# Patient Record
Sex: Female | Born: 1945 | Race: Black or African American | Hispanic: No | Marital: Married
Health system: Southern US, Community
[De-identification: ages and names within clinical notes are randomized; demographics above are authoritative.]

---

## 2005-11-18 ENCOUNTER — Ambulatory Visit: Payer: Self-pay | Admitting: Endocrinology

## 2005-11-18 IMAGING — CT CT ANGIO AORTA RUNOFF
2 of 5 series · 9 of 16 positions shown, 11 images · IV contrast (APPLIED)
Comparison: none

REASON FOR EXAM: Claudication
COMMENTS:

[Series 5: inspace upper · axial · 0.82mm/px · z∈[+751,+1387]mm · 6 of 743 slices shown, 8 images]
[im 107/743  soft-tissue]
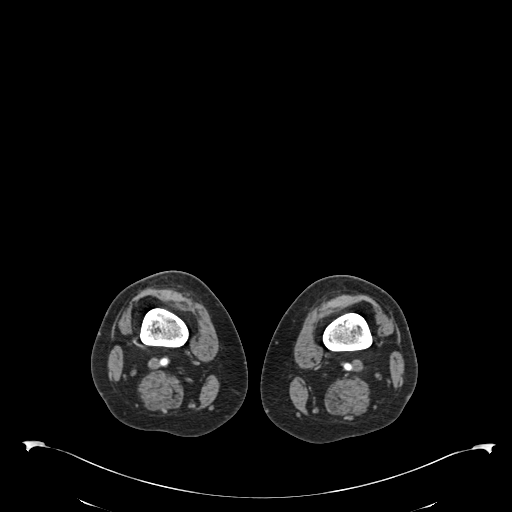
[im 107/743  bone]
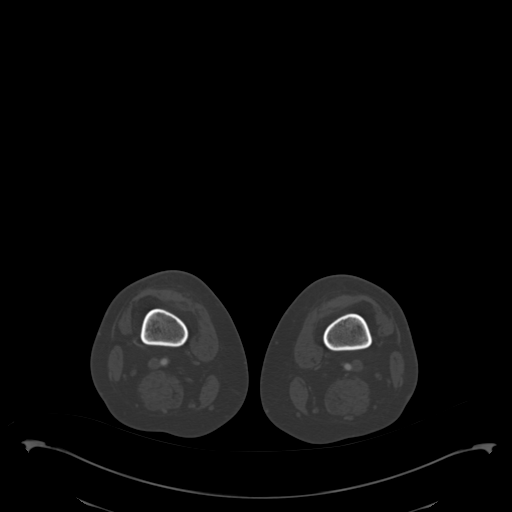
[im 213/743  soft-tissue]
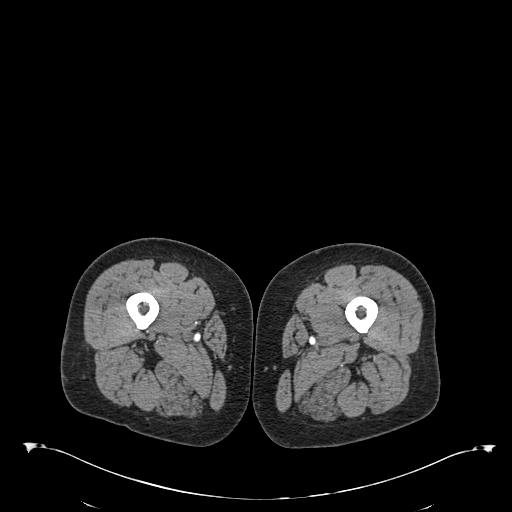
[im 319/743  soft-tissue]
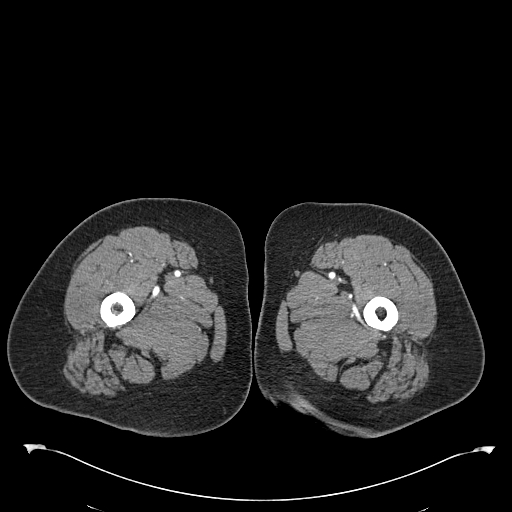
[im 425/743  soft-tissue]
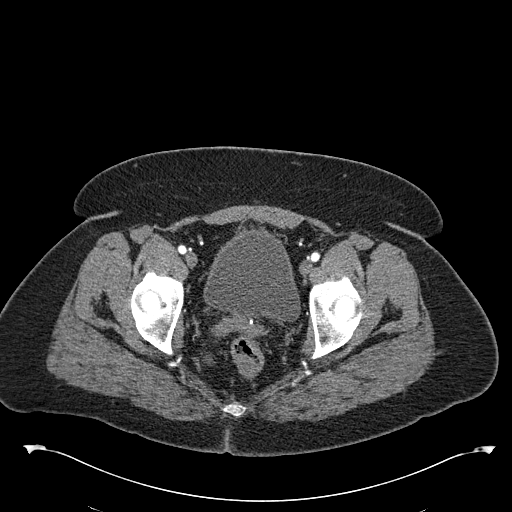
[im 531/743  soft-tissue]
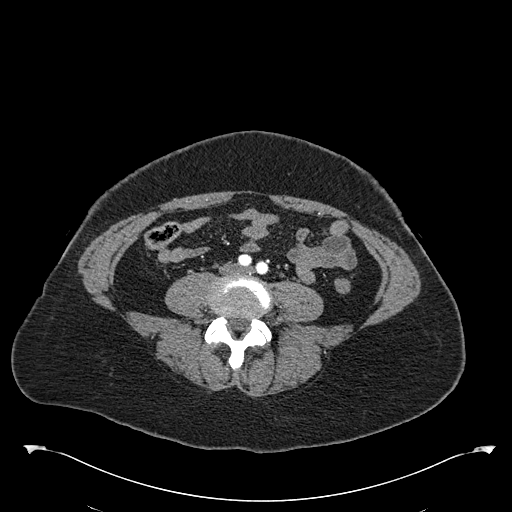
[im 531/743  bone]
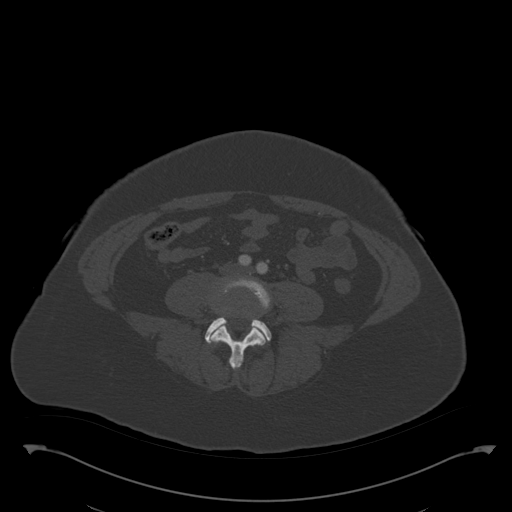
[im 637/743  soft-tissue]
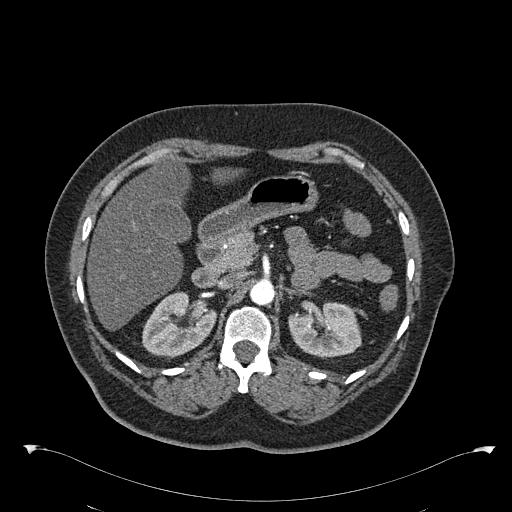

[Series 7: inspace lower · axial · 0.82mm/px · z∈[+315,+632]mm · 3 of 528 slices shown]
[im 132/528  soft-tissue]
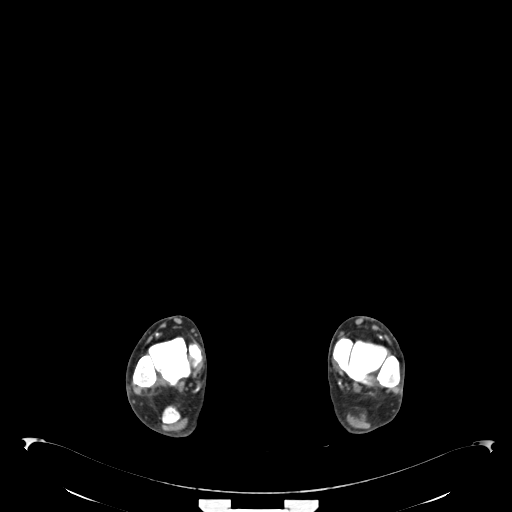
[im 264/528  soft-tissue]
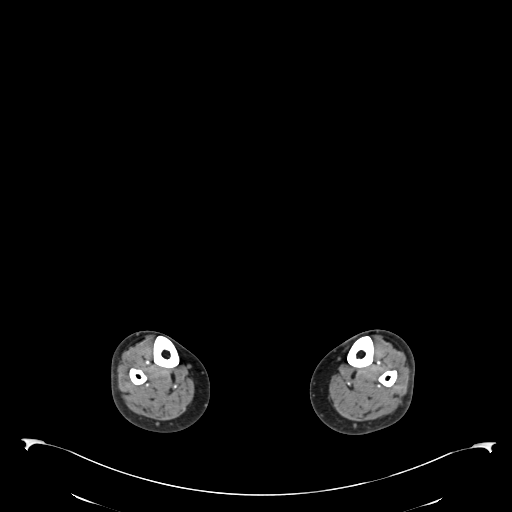
[im 396/528  soft-tissue]
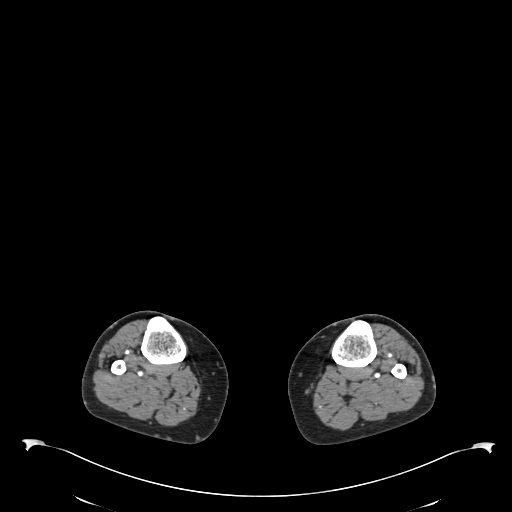

[9 of 16 positions shown; findings below may reference images not displayed]

PROCEDURE:     CT  - CT ANGIOGRAPHY AORTA RUNOFF  - [DATE]  [DATE]

RESULT:          Axial source images, maximum intensity projections, and 3D
imaging were  obtained of the abdominal aorta and lower extremities status
post intravenous administration of 125 ml of [A7].

The 3D reconstructions and maximum intensity projections were performed by
the interpreting physician.

Evaluation of the abdominal aorta and common iliac arteries demonstrates no
evidence of strictural narrowing, segmental nor focal stenosis, nor fusiform
dilatation or focal outpouchings.

Evaluation of the LEFT lower extremity and iliac vessels demonstrates areas
of moderate-to-severe atherosclerotic disease within the perforating vessels
of the internal iliac artery.  The external iliac artery demonstrates no
evidence of significant atherosclerotic disease.  Evaluation of the common
femoral artery demonstrates no evidence of significant atherosclerotic
disease.  The profundal femoral artery demonstrates regions of
moderate-to-severe atherosclerotic disease within the deep perforating
vessels.  The superficial femoral artery on the LEFT demonstrates no
evidence of significant atherosclerotic disease, fusiform dilatation, focal
outpouchings or strictural narrowing.  Evaluation of the trifurcation
vessels demonstrates patency of the proximal third of the anterior tibial
artery with complete narrowing at the distal third.  The opacified portion
of the anterior tibial artery demonstrates what appears to be
mild-to-moderate atherosclerotic disease, though there is occlusion of the
distal third without evidence of reconstitution.  The tibioperoneal trunk
demonstrates complete occlusion of the distal third of the peroneal artery
with diffuse narrowing of the peroneal artery which appears to be consistent
with diffuse, likely moderate atherosclerotic disease.  The posterior tibial
artery demonstrates patency to and through the level of the ankle with what
appears to be diffuse narrowing likely representing the sequela of moderate
atherosclerotic disease.

Evaluation of the RIGHT common iliac demonstrates no evidence of
abnormalities.  The internal iliac artery demonstrates what appears to be
moderate-to-severe atherosclerotic disease within the perforating vessels.
The common femoral artery appears intact without abnormalities.  The
profundal femoral artery demonstrates areas of severe atherosclerotic
disease within the perforating vessels.  The superficial femoral artery
appears to be patent throughout its course without evidence of significant
disease.  Note, the RIGHT and LEFT popliteal arteries appear to be intact
without evidence of significant disease.  The anterior tibial artery on the
RIGHT is intact to its distal third where there is complete occlusion
indicative of severe atherosclerotic disease.  Skip lesions of
reconstitution are demonstrated to and through the level of the ankle with
large areas of complete occlusion.  The peroneal artery demonstrates diffuse
moderate narrowing indicative of diffuse moderate atherosclerotic disease
with complete occlusion along the distal third.  The posterior tibial artery
appears to be patent to and through the level of the ankle with diffuse,
moderate narrowing indicative of diffuse atherosclerotic disease.
IMPRESSION: Findings consistent with severe atherosclerotic disease
within the distal third of the anterior tibial and peroneal arteries on the
RIGHT and LEFT with complete occlusion.  There appears to be moderate
atherosclerotic disease throughout the RIGHT and LEFT posterior tibial
arteries which appear to be patent to and through the level of the ankles as
well as within the course of the peroneal arteries.

## 2008-05-14 ENCOUNTER — Inpatient Hospital Stay (HOSPITAL_COMMUNITY): Admission: EM | Admit: 2008-05-14 | Discharge: 2008-05-16 | Payer: Self-pay | Admitting: Emergency Medicine

## 2008-05-14 ENCOUNTER — Ambulatory Visit: Payer: Self-pay | Admitting: Internal Medicine

## 2008-05-14 IMAGING — CT CT HEAD W/O CM
5 series · 18 of 47 positions shown, 19 images · non-contrast
Comparison: None

CT HEAD

CLINICAL DATA: Found unresponsive by daughter.  EtOH.

CT HEAD WITHOUT CONTRAST
CT CERVICAL SPINE WITHOUT CONTRAST
TECHNIQUE: Multidetector CT imaging of the head and cervical spine
was performed following the standard protocol without intravenous
contrast.  Multiplanar CT image reconstructions of the cervical
spine were also generated.

[Series 3: headseq 4.8 h45s · axial · 0.43mm/px · z∈[-128,-80]mm · 2 of 30 slices shown, 3 images]
[im 10/30  brain]
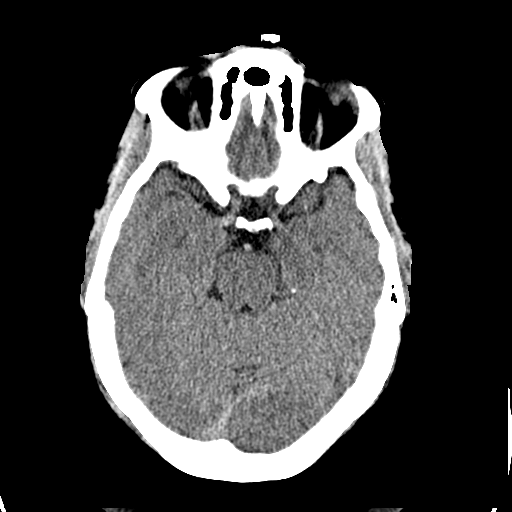
[im 10/30  bone]
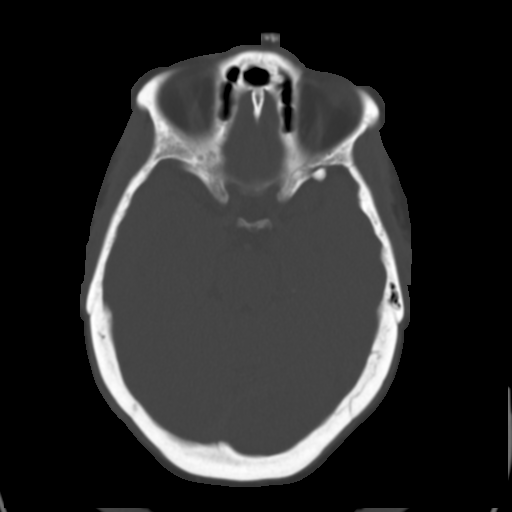
[im 20/30  brain]
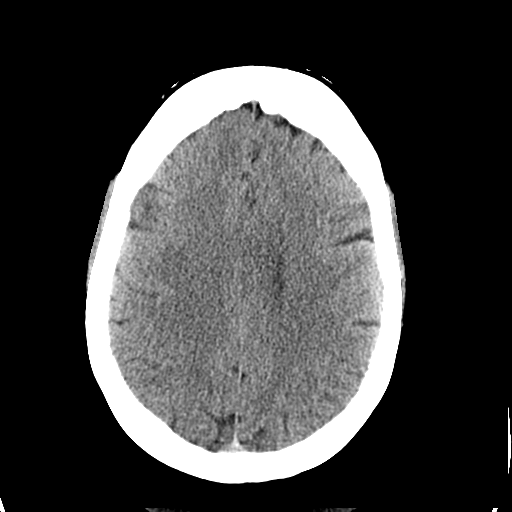

[Series 4: c_spine 2.0 b31s · axial · 0.22mm/px · z∈[-312,-292]mm · 2 of 77 slices shown]
[im 10/77  brain]
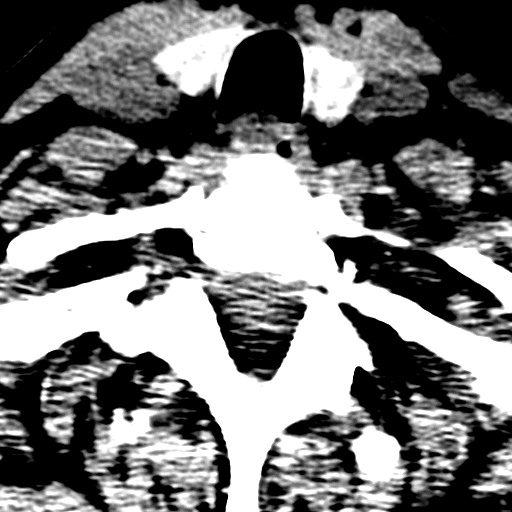
[im 20/77  brain]
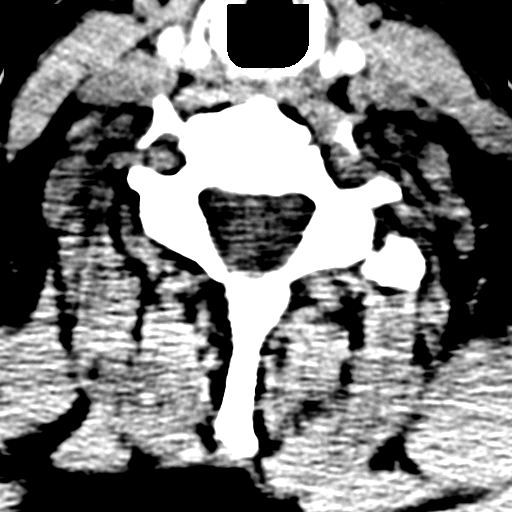

[Series 602: axial cervical · axial · 0.30mm/px · z∈[-350,-234]mm · 8 of 149 slices shown]
[im 10/149  brain]
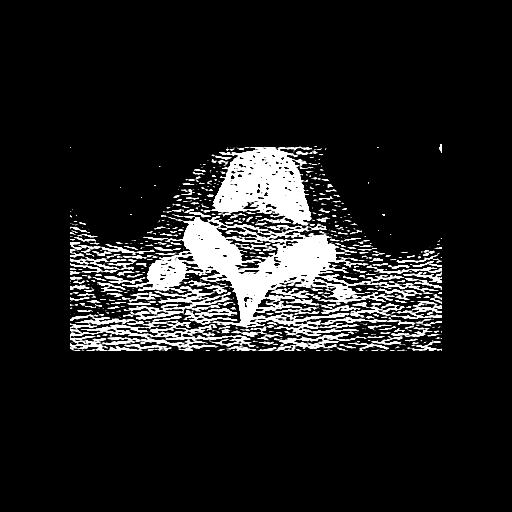
[im 28/149  brain]
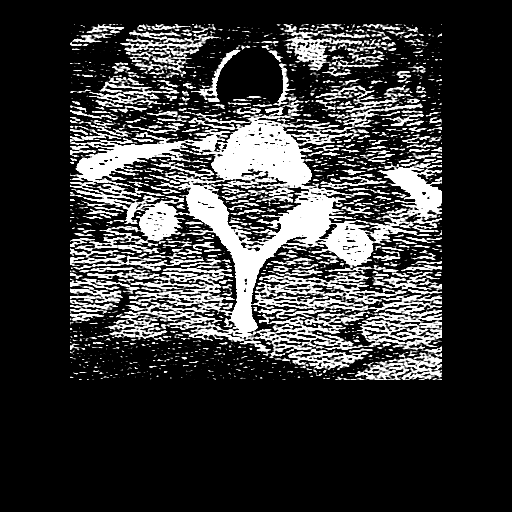
[im 47/149  brain]
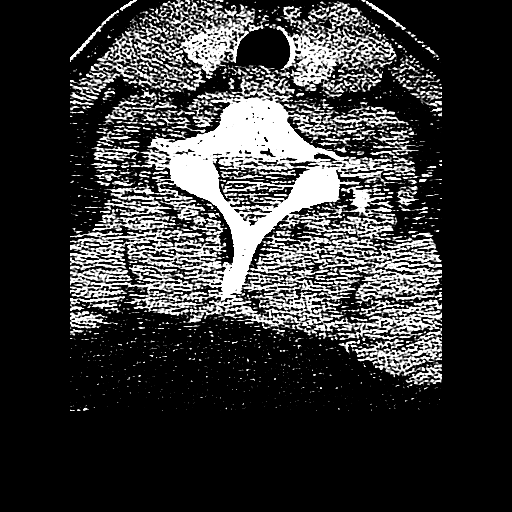
[im 65/149  brain]
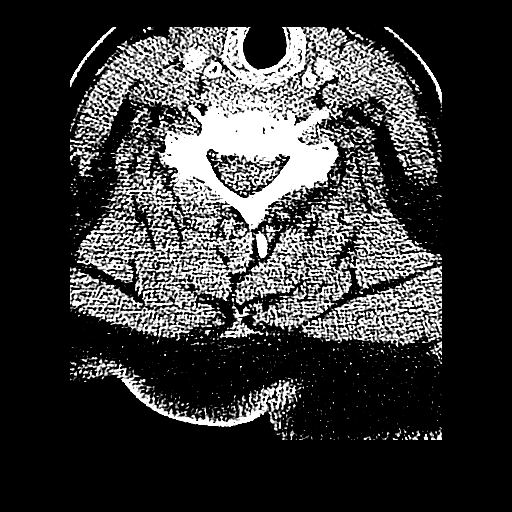
[im 84/149  brain]
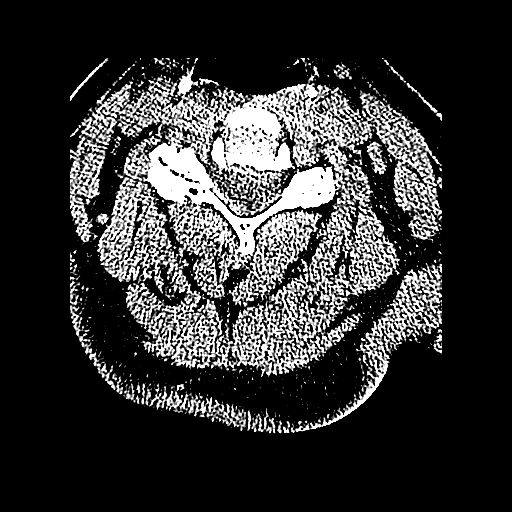
[im 102/149  brain]
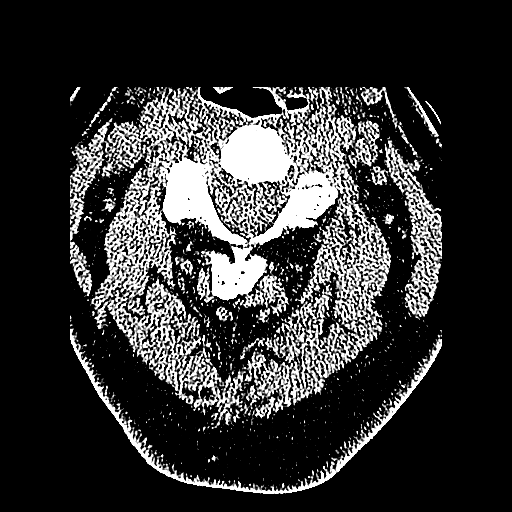
[im 121/149  brain]
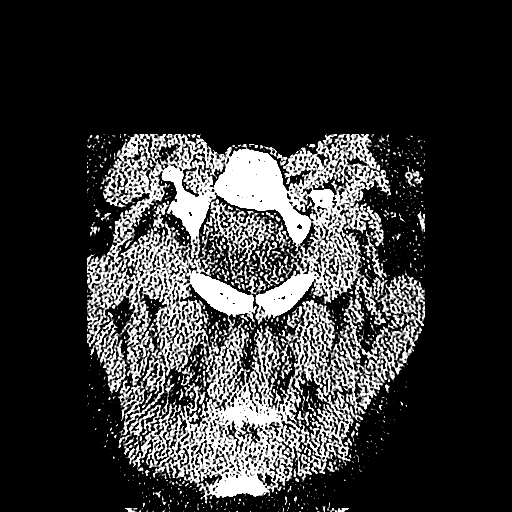
[im 139/149  brain]
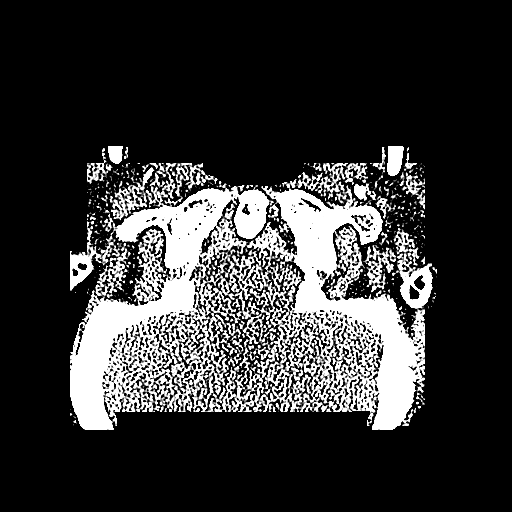

[Series 603: coronal cervical · coronal · 0.30mm/px · 3 of 70 slices shown]
[im 24/70  brain]
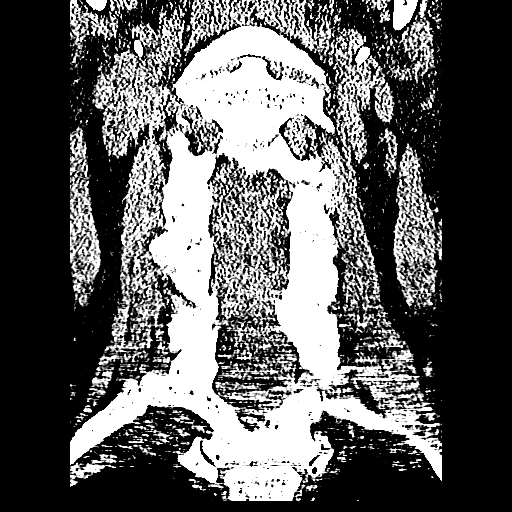
[im 31/70  brain]
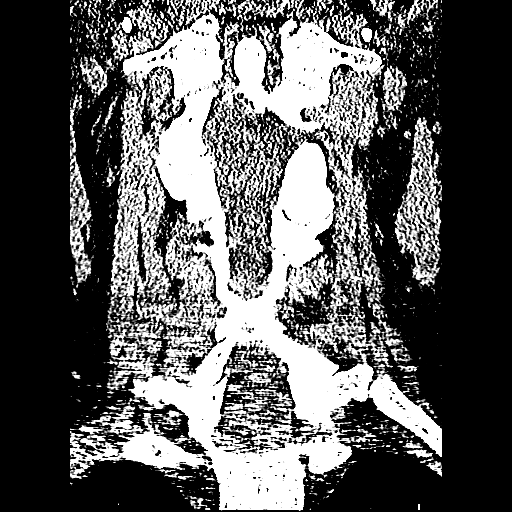
[im 39/70  brain]
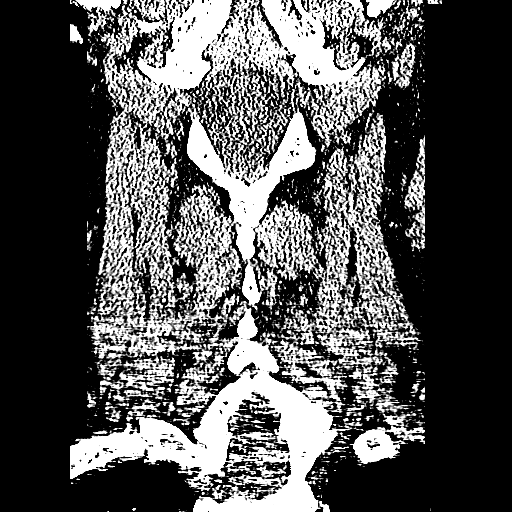

[Series 604: sagittal cervical · sagittal · 0.30mm/px · 3 of 70 slices shown]
[im 24/70  brain]
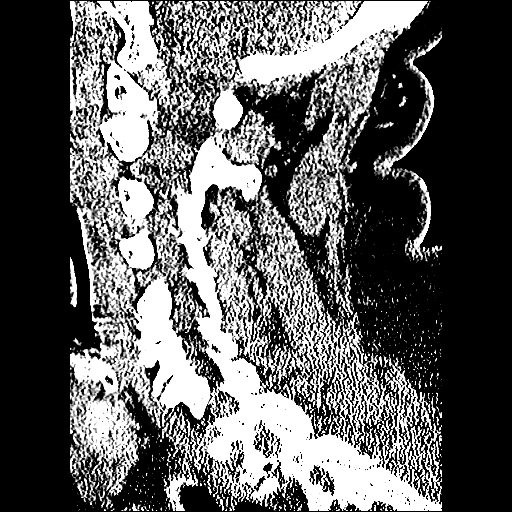
[im 35/70  brain]
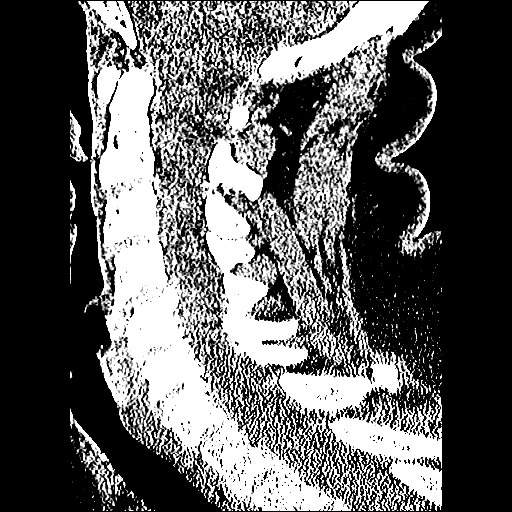
[im 47/70  brain]
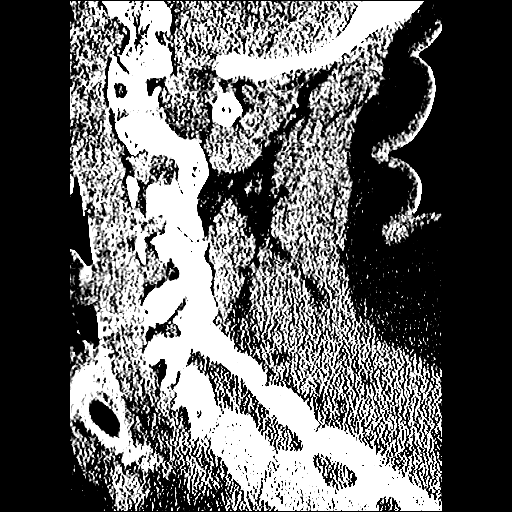

[18 of 47 positions shown; findings below may reference images not displayed]

FINDINGS: There is no intra or extra-axial fluid collection or
mass.  The basilar cisterns and ventricles have normal appearance.
There is no CT evidence for acute infarction or intracranial
hemorrhage.  Bone windows are unremarkable.
IMPRESSION: No CT evidence for acute intracranial abnormality.

CT CERVICAL SPINE
FINDINGS: There is degenerative change within the cervical spine,
most notable at C3-4, C4-5, C5-6, C6-7.  There is no evidence for
acute fracture or dislocation.  Note is made of unfused posterior
ring of C1.  Visualized soft tissues of the neck have normal
appearance.
IMPRESSION: Degenerative change.  No evidence for acute abnormality.

## 2008-12-25 ENCOUNTER — Ambulatory Visit (HOSPITAL_COMMUNITY): Admission: RE | Admit: 2008-12-25 | Discharge: 2008-12-25 | Payer: Self-pay | Admitting: Family Medicine

## 2009-03-16 ENCOUNTER — Encounter: Admission: RE | Admit: 2009-03-16 | Discharge: 2009-03-16 | Payer: Self-pay | Admitting: Family Medicine

## 2009-03-16 IMAGING — CR DG CHEST 2V
2 series · 2 of 2 positions shown · non-contrast
Comparison: [DATE]

CLINICAL DATA: Cough.  Hypertension.

CHEST - 2 VIEW

[w chest pa]
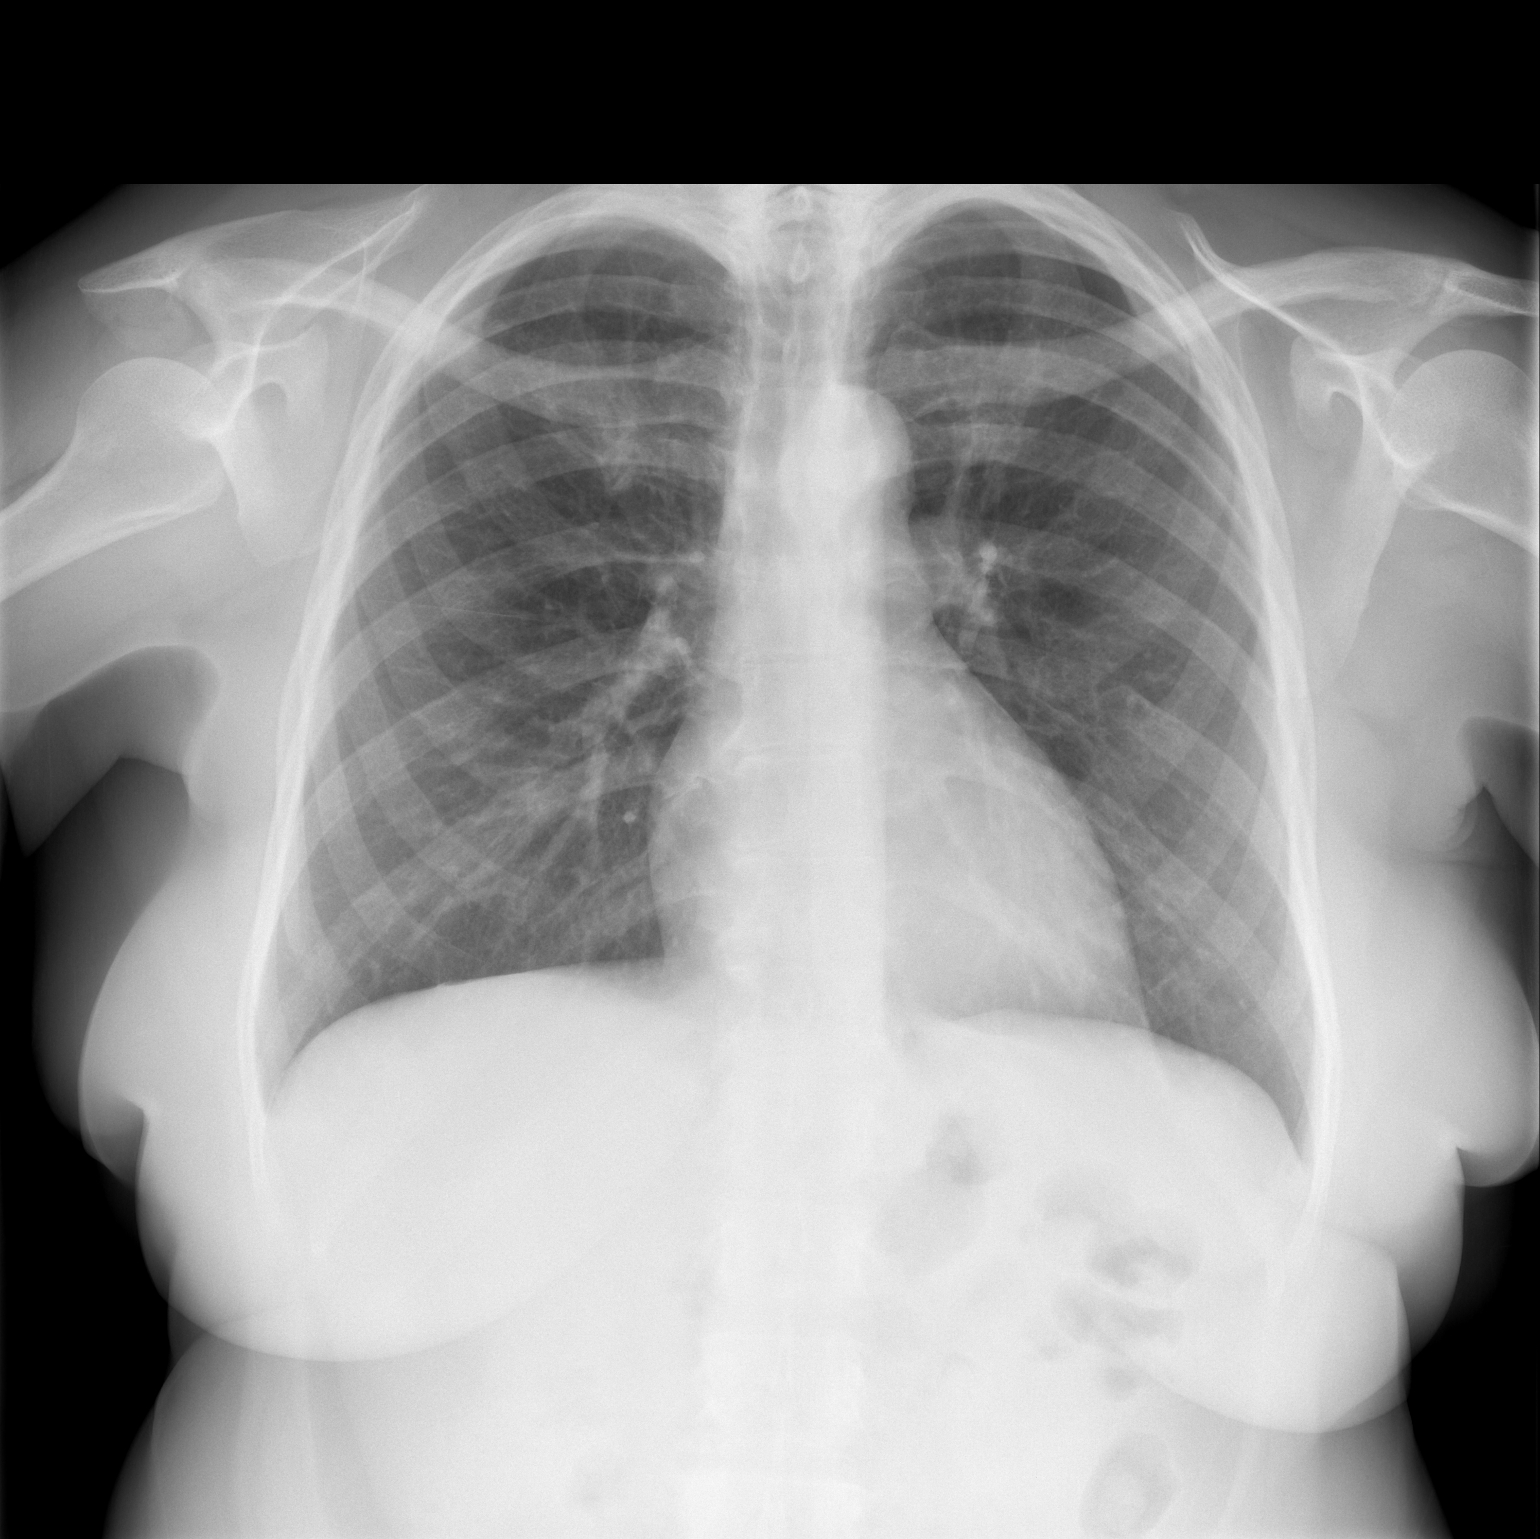

[w chest lat]
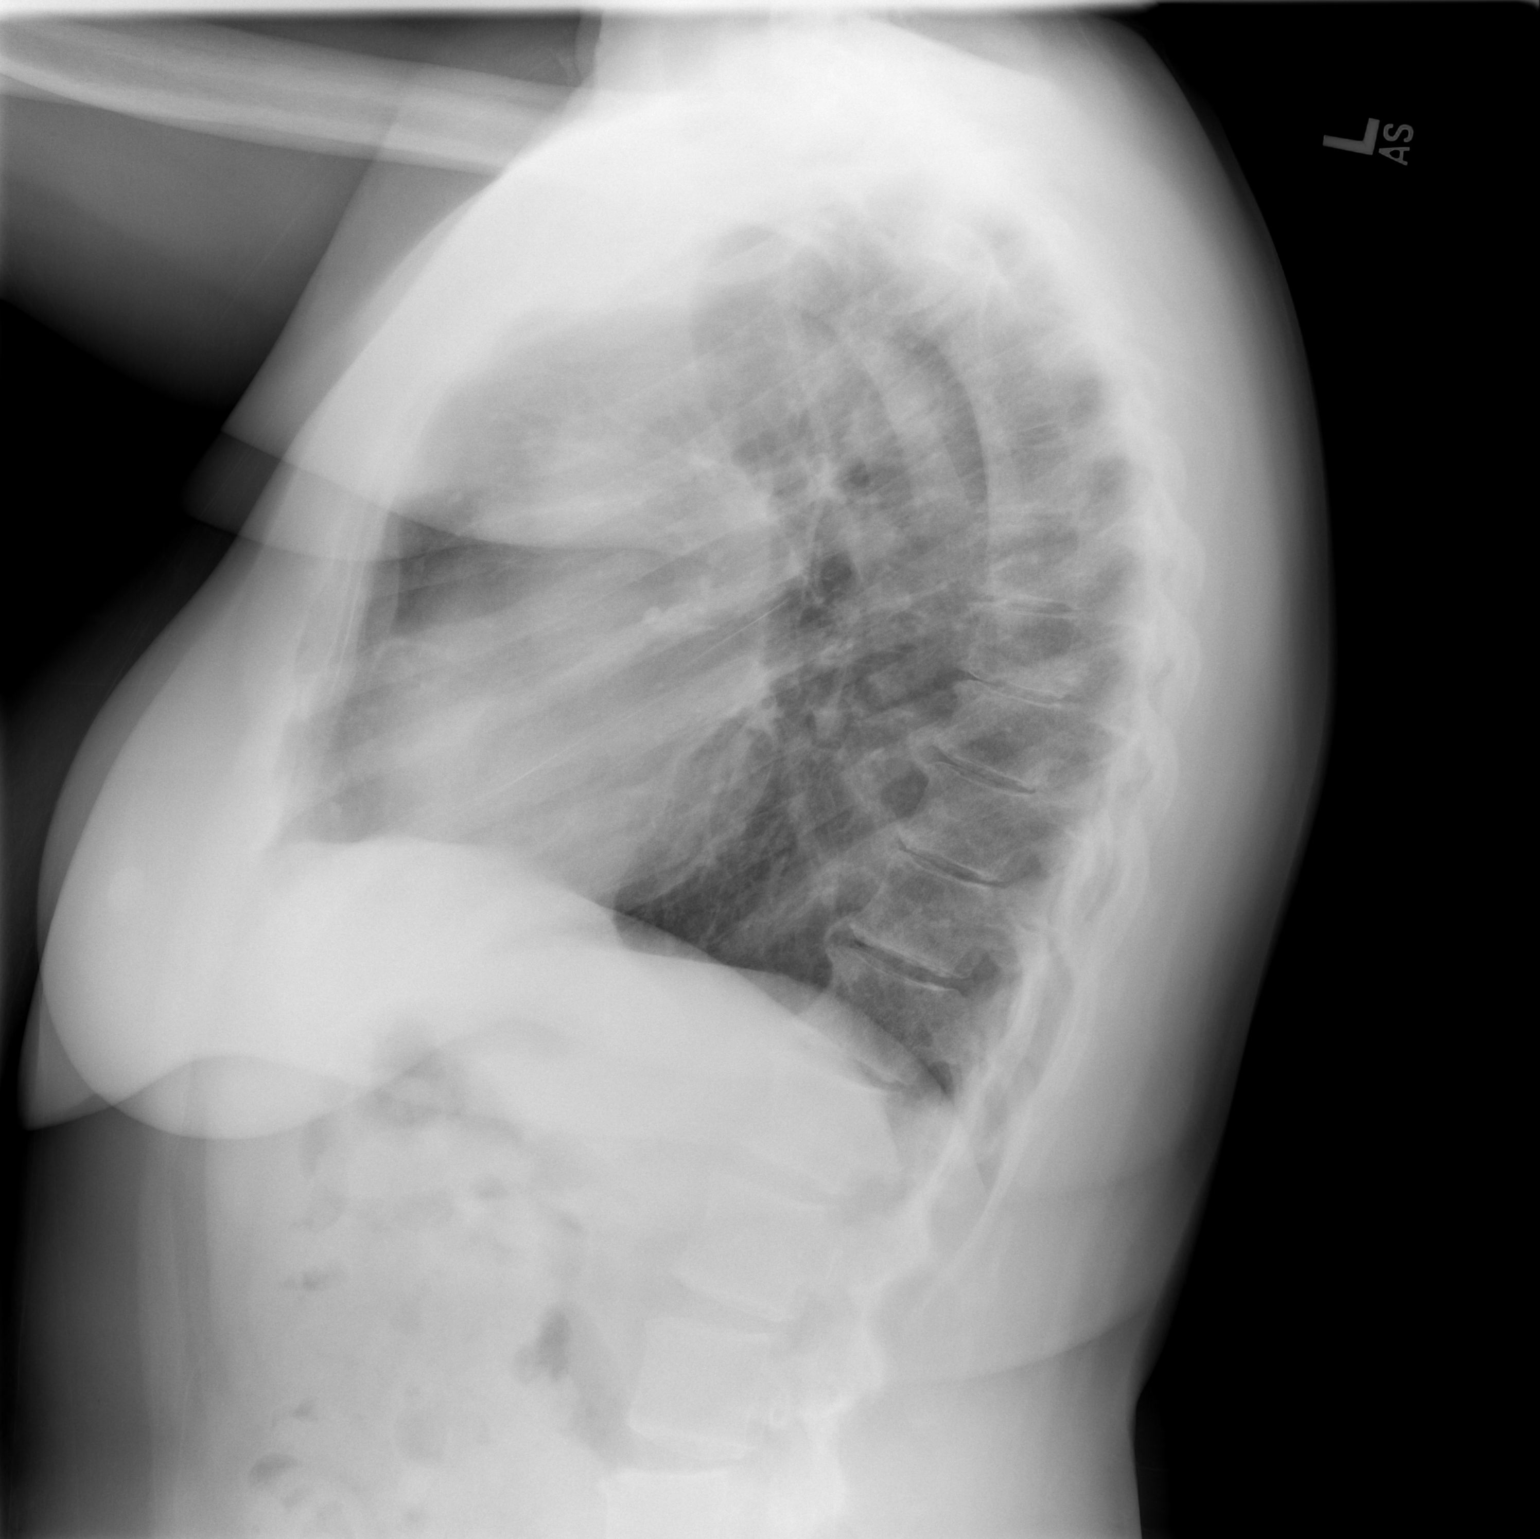

[2 of 2 positions shown; findings below may reference images not displayed]

FINDINGS: Heart size is normal.  The mediastinum is unremarkable.
Lungs are clear.  No effusions.  No significant bony finding.
IMPRESSION: No active disease

## 2010-03-11 ENCOUNTER — Other Ambulatory Visit: Admission: RE | Admit: 2010-03-11 | Discharge: 2010-03-11 | Payer: Self-pay | Admitting: Family Medicine

## 2010-03-18 ENCOUNTER — Encounter: Admission: RE | Admit: 2010-03-18 | Discharge: 2010-03-18 | Payer: Self-pay | Admitting: Family Medicine

## 2010-10-14 ENCOUNTER — Encounter: Admission: RE | Admit: 2010-10-14 | Discharge: 2010-10-14 | Payer: Self-pay | Admitting: Family Medicine

## 2010-10-14 IMAGING — CR DG HIP COMPLETE 2+V*R*
2 series · 2 of 2 positions shown · non-contrast
Comparison: None

CLINICAL DATA: Pain posterior right hip down leg

RIGHT HIP - COMPLETE 2+ VIEW

[t hip ap right]
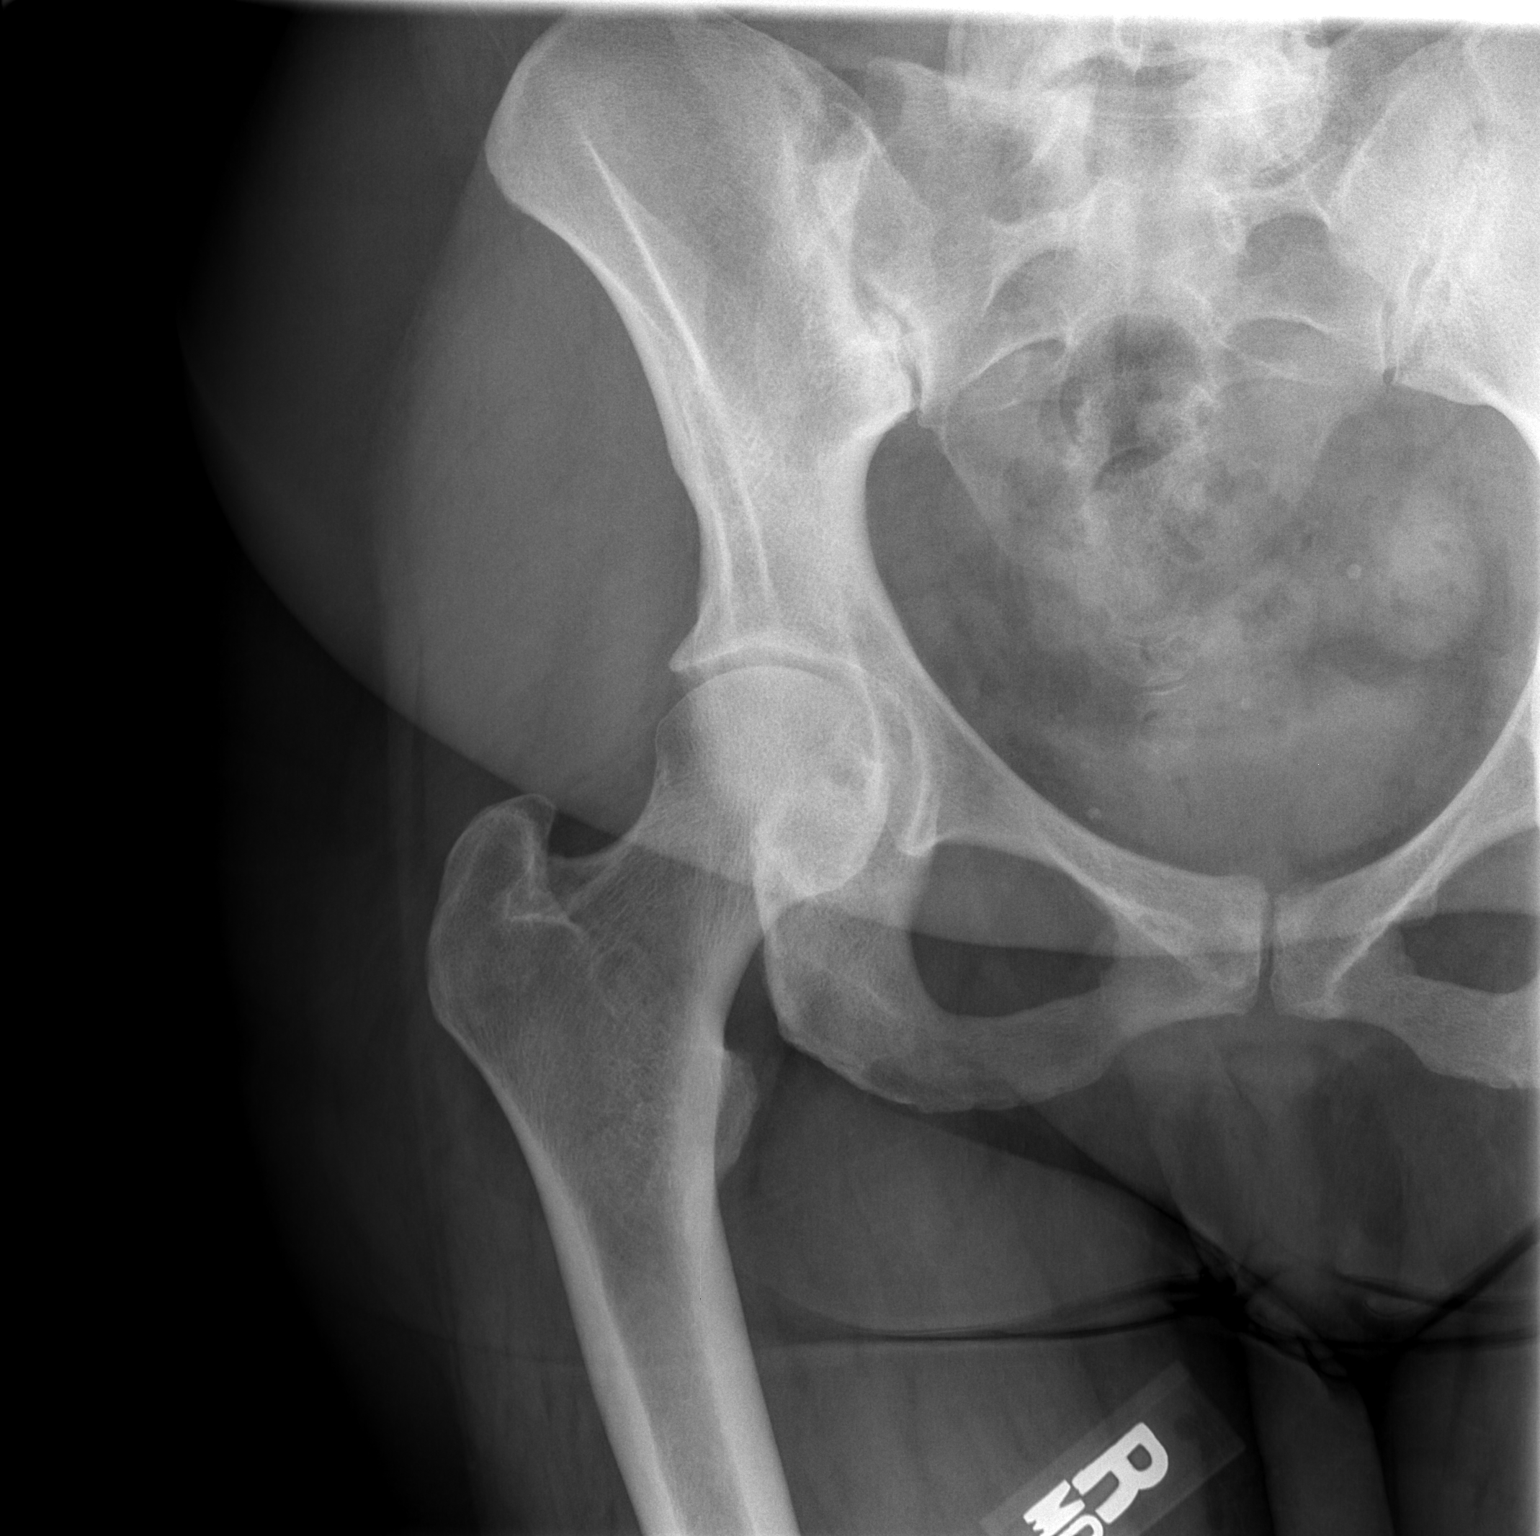

[t hip frog leg right]
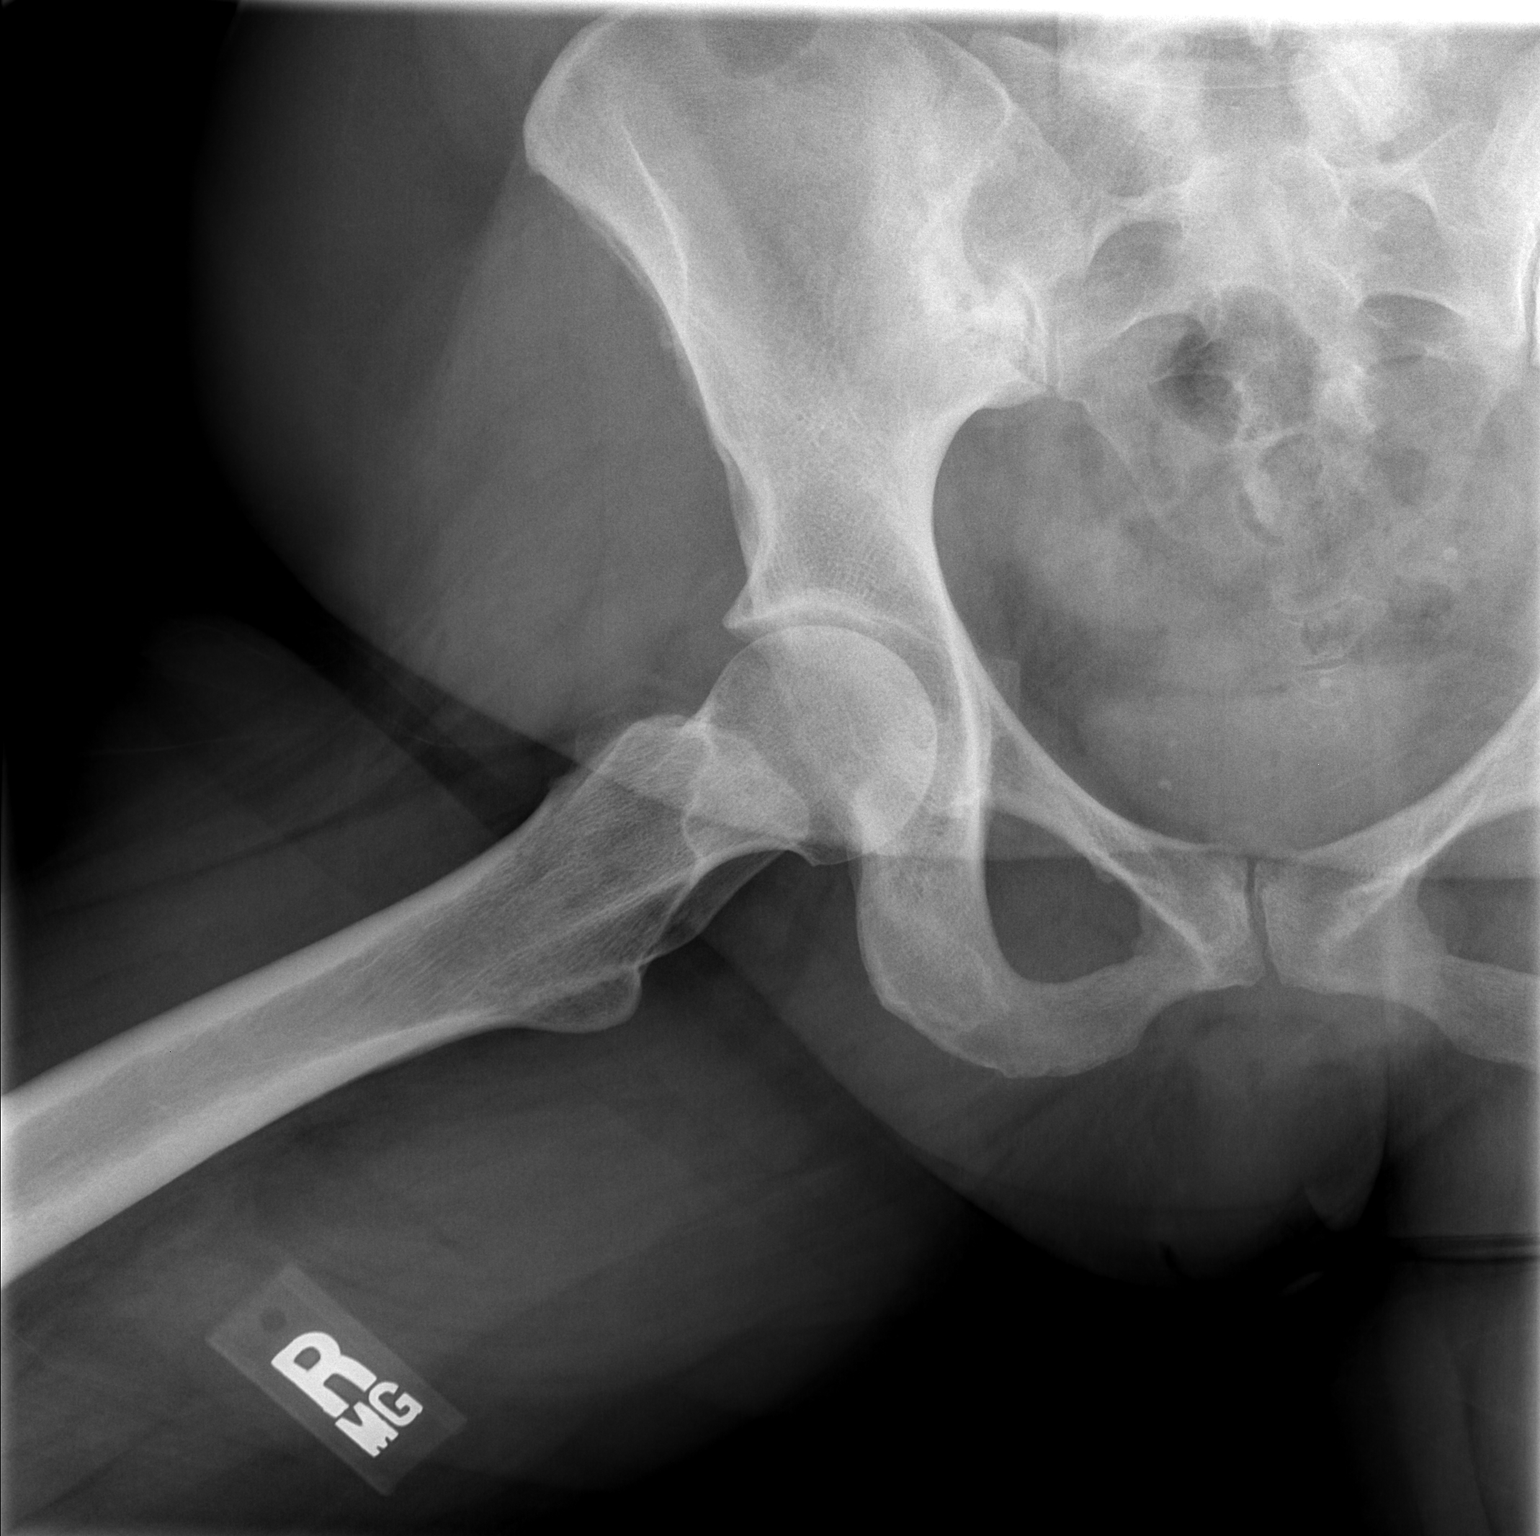

[2 of 2 positions shown; findings below may reference images not displayed]

FINDINGS: Right hip joint space fairly well preserved.
No acute fracture or dislocation.
Tiny pelvic phleboliths.
Osseous mineralization grossly unremarkable.
Asymmetric sclerosis along iliac side of right SI joint suggesting
sacroiliitis.
IMPRESSION: No acute right hip joint abnormalities.
Right sacroiliitis.

## 2010-10-14 IMAGING — CR DG LUMBAR SPINE 2-3V
3 series · 3 of 3 positions shown · non-contrast
Comparison: None

CLINICAL DATA: Posterior right hip down the leg

LUMBAR SPINE - 2-3 VIEW

[t l-spine a.p.]
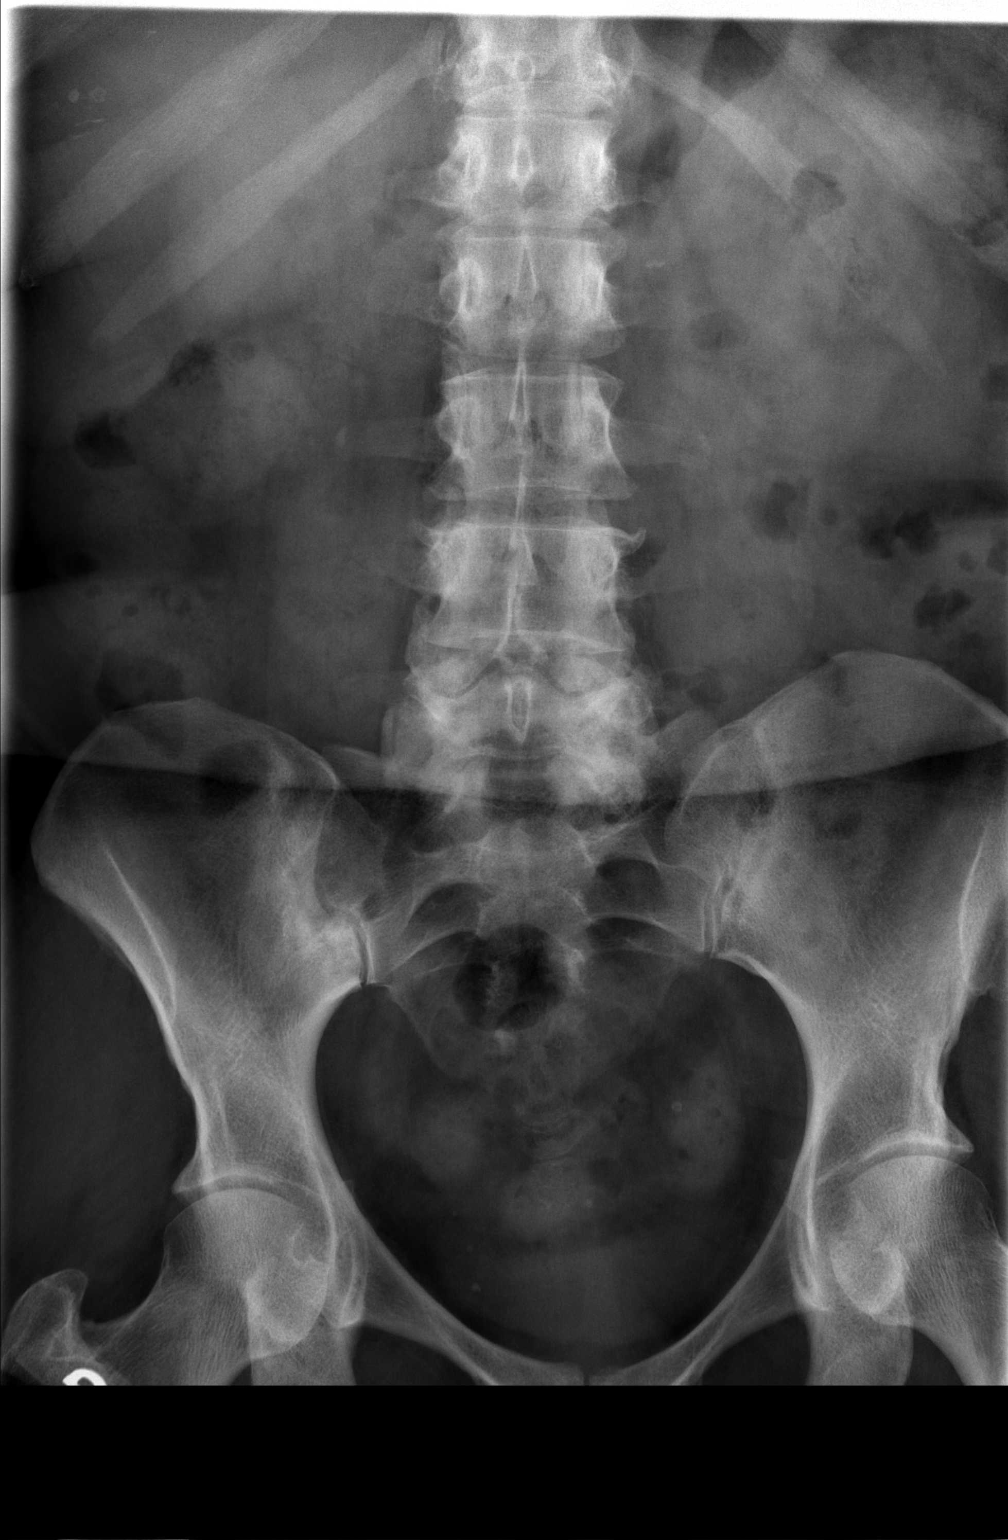

[t l-spine lat]
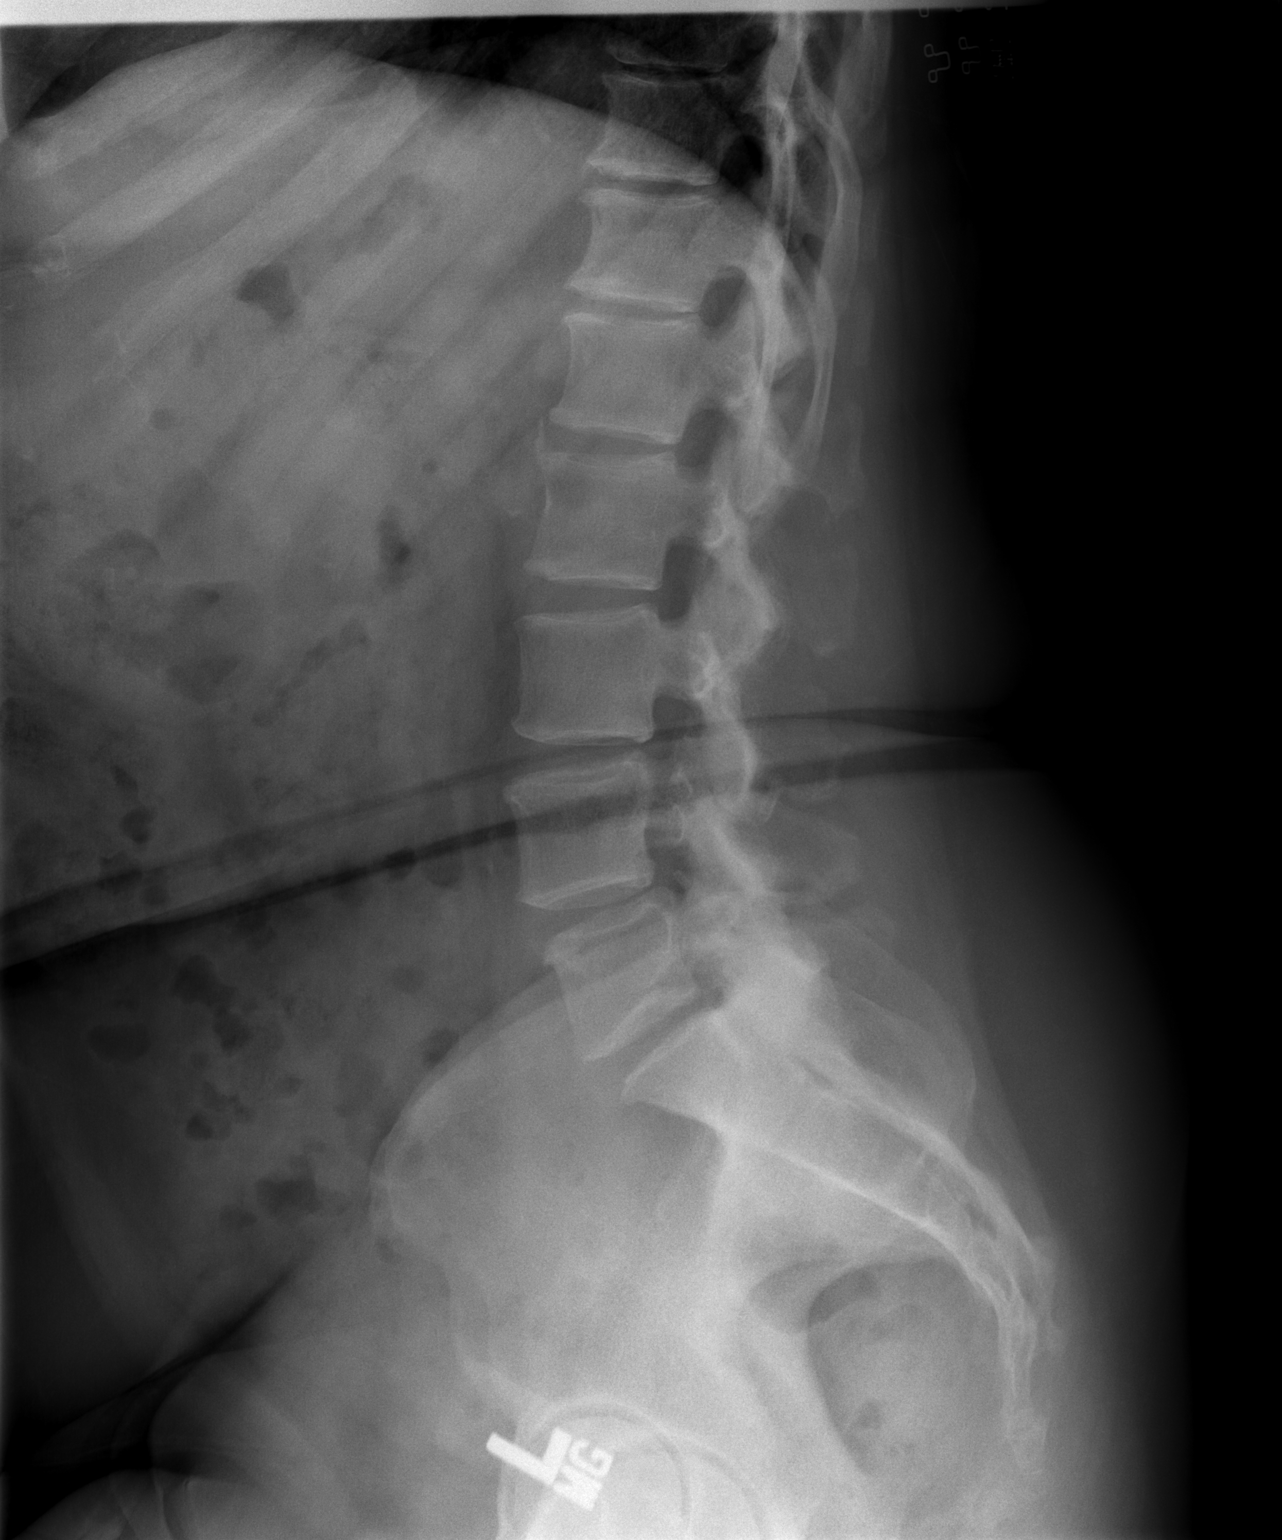

[t l-spine l5-s1 spot]
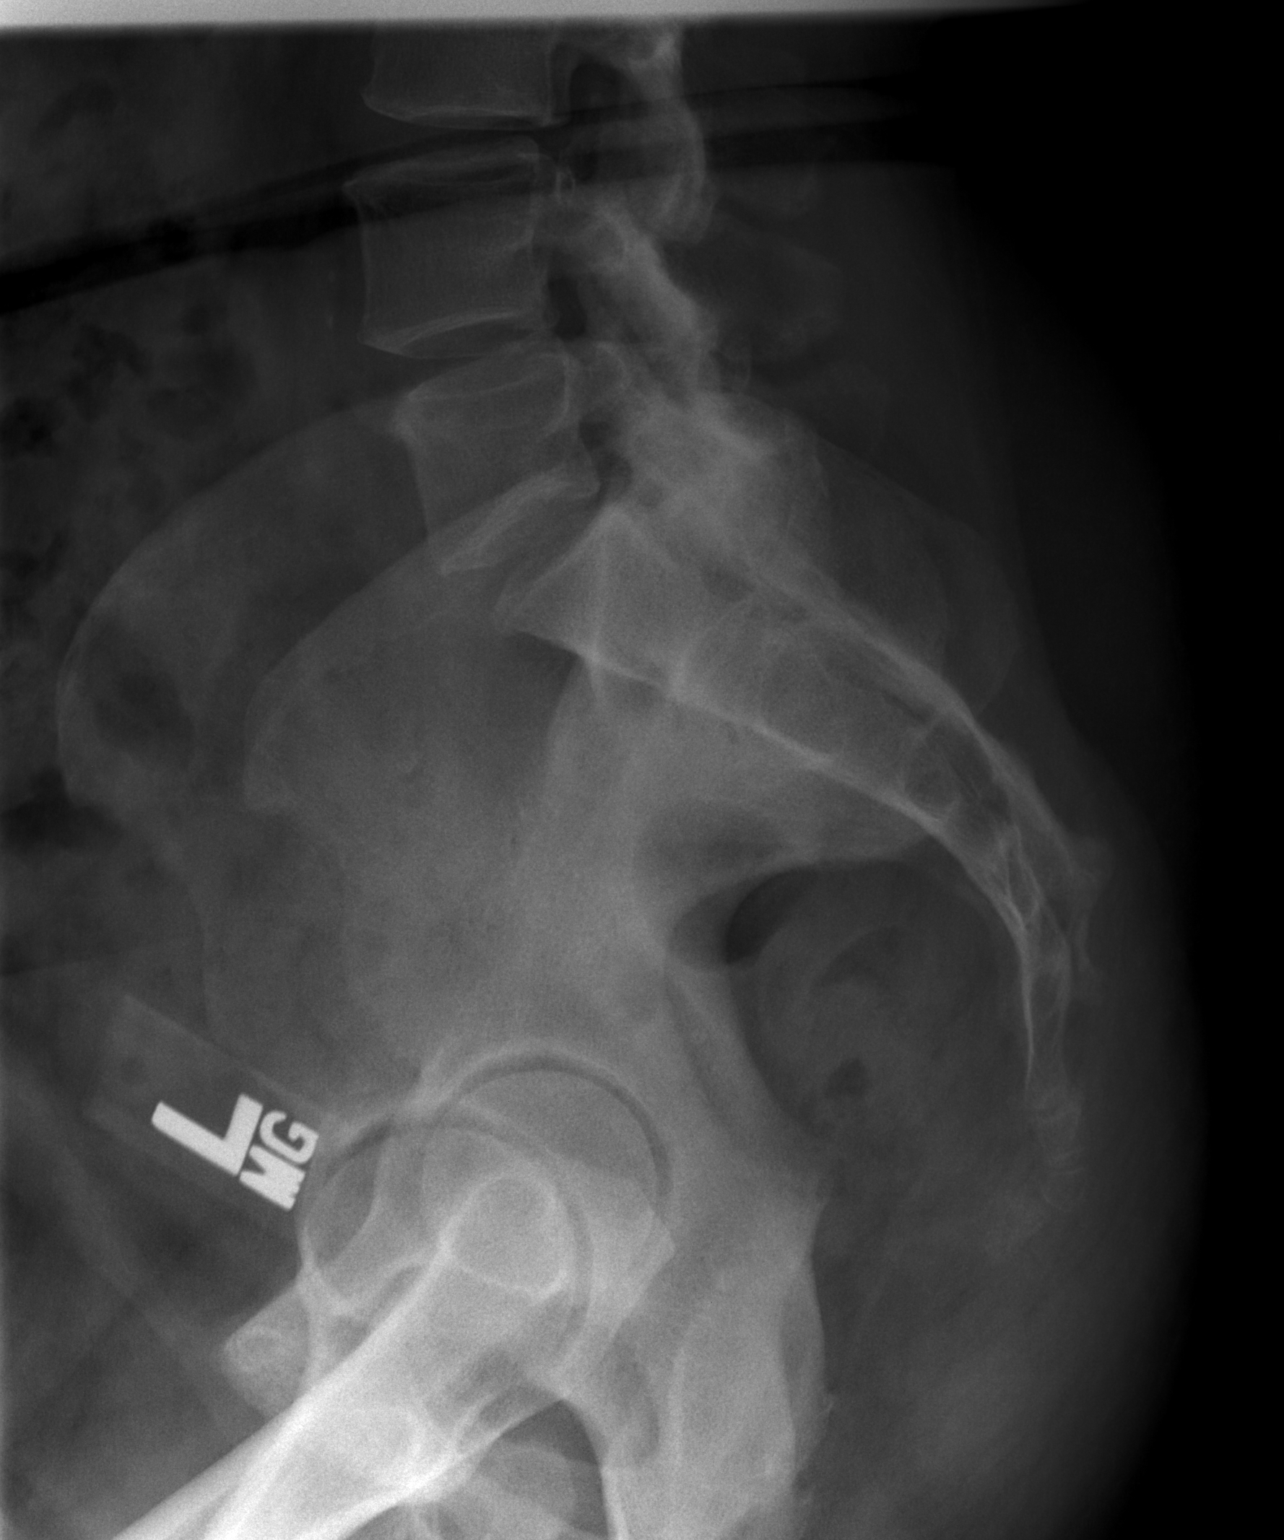

[3 of 3 positions shown; findings below may reference images not displayed]

FINDINGS: Five non-rib bearing lumbar vertebrae.
Disc space narrowing L3-L4, L4-L5.
Minimal end plate spur formation superior endplate L4.
Facet degenerative changes L4-L5 and L5-S1.
Minimal retrolisthesis L4-L5, likely related to degenerative disc
and facet disease.
No acute fracture or additional subluxation.
Asymmetric sclerosis on iliac side of right SI joint suggesting
asymmetric sacroiliitis.
Remaining visualized portions of pelvis intact.
IMPRESSION: Mild scattered degenerative disc and facet disease changes of
lumbar spine as above.
If patient's symptoms are radicular in character, consider MR
imaging of the lumbar spine for further assessment.
Asymmetric right sacroiliitis.

## 2011-03-25 NOTE — H&P (Signed)
NAMEBLAINE, GUIFFRE                 ACCOUNT NO.:  [id number]   MEDICAL RECORD NO.:  [id number]          PATIENT TYPE:  INP   LOCATION:  1225                         FACILITY:  [facility]   PHYSICIAN:  Gardiner Barefoot, MD    DATE OF BIRTH:  [birth year]   DATE OF ADMISSION:  05/14/2008  DATE OF DISCHARGE:                              HISTORY & PHYSICAL   PRIMARY CARE PHYSICIAN:  Dr. Marny Lowenstein, [facility]   CHIEF COMPLAINT:  Unresponsiveness.   HISTORY OF PRESENT ILLNESS:  This is a 65 year old female with a history  of hypertension who was found about 2 hours prior to presentation  unresponsive at home.  The patient was found to be not moving and EMS  was called by her daughter.  The unresponsiveness was unwitnessed.  No  known tonic-clonic activity or any other falling, etc.  Prior to this,  the patient was in her usual state of health.  It was suspected the  patient was drinking during the evening with family members; however,  this was not an excessive amount or unusual activity for the patient.  No known recent illnesses or sick contacts.   PAST MEDICAL HISTORY:  1. Hypertension.  2. Hypercholesterolemia.  3. History of hysterectomy.   MEDICATIONS:  The patient's daughter did bring a bag of medications  which include hydrochlorothiazide, lisinopril/hydrochlorothiazide and  Maxzide, as well as Skelaxin, Mobic, Vytorin, pantoprazole, Voltaren,  Klor-Con, and allopurinol.  However, it is unknown exactly which  medications she is taking.   ALLERGIES:  No known drug allergies.   FAMILY HISTORY:  CAD per the daughter.   SOCIAL HISTORY:  Occasional alcohol.   REVIEW OF SYSTEMS:  Unobtainable from the patient at the time.   PHYSICAL EXAMINATION:  VITAL SIGNS:  Temperature 97.9, pulse 98,  respirations 16, blood pressure is 131/84, this is down from 202/127.  GENERAL:  The patient is unresponsive.  CARDIOVASCULAR:  Tachycardiac with regular rhythm.  No murmurs, rubs or  gallops.  LUNGS:  Clear to auscultation bilaterally.  ABDOMEN:  Soft, nontender, nondistended, with positive bowel sounds and  no hepatosplenomegaly.  EXTREMITIES:  No edema.  NEUROLOGIC:  The patient is unresponsive including to sternal rub.  Pupils are equal, round and reactive to light.   LABORATORY DATA:  CT head and cervical spine with no acute disease.  Chest x-ray with cardiomegaly.  EKG with LVH, left axis deviation.  Laboratory:  ALT is 127, AST is 89.  Urine drug screen is negative.  Alk  phos 141.  Alcohol level is 143.  Total CK is 371 with an MB of 5.1 and  an index of 1.4.  Troponin is 0.01.  WBC 3.4, hemoglobin 16, platelets  114.  INR 1.0.   ASSESSMENT AND PLAN:  1. Unresponsive/altered mental status.  I do question if this is a      postictal state from a seizure.  Other etiology certainly could be      medications not found on the urine drug screen, stroke or      encephalopathy, particularly with the elevated ALT.  Will check an      ammonia level and order the patient to have an MRI in the a.m.      Also, will reevaluate when the patient's alcohol level has returned      to normal.  2. Transaminitis.  The ALT is elevated higher than the AST, and alk      phos is elevated some.  She also has low platelets.  I do wonder if      there is an underlying liver disease and therefore will check an      ammonia level to ensure that there is no encephalopathy from a      hepatic origin, and also will recheck AST and ALT in the a.m.  Will      consider a hepatitis panel and ultrasound if it does not resolve.  3. Leukopenia.  The patient's WBC count is decreased to 3.4.      Etiologies of this certainly could be viral infection, HIV, as part      of pancytopenia with her low platelets as well.  Other viral      etiologies could be EBV or CMV as well as HIV.  Also recheck this      in the a.m. and consider further workup as deemed necessary.   DISPOSITION:  The patient will  be admitted.  Will be started on DVT  prophylaxis with Pneumoboots, will avoid Lovenox at this time secondary  to her low platelets and question of whether she has any kind of  cirrhosis.      Gardiner Barefoot, MD  Electronically Signed     RWC/MEDQ  D:  05/14/2008  T:  05/14/2008  Job:  [id number]

## 2011-08-07 LAB — DIFFERENTIAL
Basophils Relative: 0
Basophils Relative: 0
Eosinophils Absolute: 0.1
Eosinophils Relative: 1
Eosinophils Relative: 3
Lymphs Abs: 0.6 — ABNORMAL LOW
Monocytes Absolute: 0.2
Monocytes Absolute: 0.4
Neutro Abs: 2.6

## 2011-08-07 LAB — POCT I-STAT, CHEM 8
BUN: 9
Calcium, Ion: 1.14
Chloride: 105
Potassium: 3.8
Sodium: 141

## 2011-08-07 LAB — CBC
HCT: 42.3
Hemoglobin: 12.8
Hemoglobin: 14.5
Hemoglobin: 16.1 — ABNORMAL HIGH
MCHC: 34
MCHC: 34.5
MCV: 91.6
MCV: 91.7
RBC: 4.59
RBC: 5.1
RDW: 12.1
RDW: 12.1
WBC: 3.9 — ABNORMAL LOW

## 2011-08-07 LAB — HEPATIC FUNCTION PANEL
ALT: 127 — ABNORMAL HIGH
Alkaline Phosphatase: 141 — ABNORMAL HIGH
Indirect Bilirubin: 0.5
Total Protein: 7.9

## 2011-08-07 LAB — PROTIME-INR
INR: 1.1
Prothrombin Time: 13.9

## 2011-08-07 LAB — COMPREHENSIVE METABOLIC PANEL
ALT: 111 — ABNORMAL HIGH
ALT: 81 — ABNORMAL HIGH
AST: 74 — ABNORMAL HIGH
Albumin: 3.5
Alkaline Phosphatase: 121 — ABNORMAL HIGH
Calcium: 9
Chloride: 107
Creatinine, Ser: 0.83
GFR calc Af Amer: >60
GFR calc non Af Amer: >60
Glucose, Bld: 110 — ABNORMAL HIGH
Potassium: 3.5
Sodium: 138
Sodium: 139
Total Bilirubin: 0.6
Total Protein: 5.7 — ABNORMAL LOW

## 2011-08-07 LAB — URINALYSIS, ROUTINE W REFLEX MICROSCOPIC
Glucose, UA: NEGATIVE
Hgb urine dipstick: NEGATIVE
Ketones, ur: NEGATIVE
Protein, ur: NEGATIVE

## 2011-08-07 LAB — BASIC METABOLIC PANEL
CO2: 27
Calcium: 9.7
Creatinine, Ser: 0.92
GFR calc Af Amer: >60
GFR calc non Af Amer: >60
Sodium: 141

## 2011-08-07 LAB — RAPID URINE DRUG SCREEN, HOSP PERFORMED
Benzodiazepines: NOT DETECTED
Cocaine: NOT DETECTED
Opiates: NOT DETECTED

## 2011-08-07 LAB — TROPONIN I
Troponin I: 0.01
Troponin I: 0.01

## 2011-08-07 LAB — AMMONIA: Ammonia: 35

## 2011-08-07 LAB — CK TOTAL AND CKMB (NOT AT ARMC)
CK, MB: 2.5
CK, MB: 4.7 — ABNORMAL HIGH
Relative Index: 1.4
Relative Index: 1.4
Total CK: 54

## 2011-08-07 LAB — TRICYCLICS SCREEN, URINE: TCA Scrn: NOT DETECTED

## 2011-08-07 LAB — SALICYLATE LEVEL: Salicylate Lvl: <4

## 2011-08-07 LAB — POCT CARDIAC MARKERS
CKMB, poc: 9.5
Myoglobin, poc: 263
Operator id: 173591
Troponin i, poc: <0.05

## 2011-10-20 ENCOUNTER — Other Ambulatory Visit: Payer: Self-pay | Admitting: Family Medicine

## 2011-10-20 DIAGNOSIS — Z1231 Encounter for screening mammogram for malignant neoplasm of breast: Secondary | ICD-10-CM

## 2011-11-17 ENCOUNTER — Ambulatory Visit
Admission: RE | Admit: 2011-11-17 | Discharge: 2011-11-17 | Disposition: A | Discharge status: Home / Self Care | Payer: MEDICARE | Source: Ambulatory Visit | Attending: Family Medicine | Admitting: Family Medicine

## 2011-11-17 DIAGNOSIS — Z1231 Encounter for screening mammogram for malignant neoplasm of breast: Secondary | ICD-10-CM

## 2015-01-12 ENCOUNTER — Other Ambulatory Visit: Payer: Self-pay

## 2015-01-12 DIAGNOSIS — Z1231 Encounter for screening mammogram for malignant neoplasm of breast: Secondary | ICD-10-CM

## 2015-01-29 ENCOUNTER — Ambulatory Visit
Admission: RE | Admit: 2015-01-29 | Discharge: 2015-01-29 | Disposition: A | Discharge status: Home / Self Care | Payer: BLUE CROSS/BLUE SHIELD | Source: Ambulatory Visit

## 2015-01-29 DIAGNOSIS — Z1231 Encounter for screening mammogram for malignant neoplasm of breast: Secondary | ICD-10-CM

## 2015-12-29 ENCOUNTER — Emergency Department (HOSPITAL_COMMUNITY)
Admission: EM | Admit: 2015-12-29 | Discharge: 2015-12-29 | Disposition: A | Discharge status: Home / Self Care | Payer: BLUE CROSS/BLUE SHIELD | Attending: Emergency Medicine | Admitting: Emergency Medicine

## 2015-12-29 ENCOUNTER — Emergency Department (HOSPITAL_COMMUNITY): Payer: BLUE CROSS/BLUE SHIELD

## 2015-12-29 DIAGNOSIS — Z7982 Long term (current) use of aspirin: Secondary | ICD-10-CM | POA: Diagnosis not present

## 2015-12-29 DIAGNOSIS — Z79899 Other long term (current) drug therapy: Secondary | ICD-10-CM | POA: Diagnosis not present

## 2015-12-29 DIAGNOSIS — R531 Weakness: Secondary | ICD-10-CM | POA: Insufficient documentation

## 2015-12-29 DIAGNOSIS — F10120 Alcohol abuse with intoxication, uncomplicated: Secondary | ICD-10-CM | POA: Insufficient documentation

## 2015-12-29 DIAGNOSIS — F10129 Alcohol abuse with intoxication, unspecified: Secondary | ICD-10-CM | POA: Diagnosis not present

## 2015-12-29 DIAGNOSIS — R4182 Altered mental status, unspecified: Secondary | ICD-10-CM | POA: Insufficient documentation

## 2015-12-29 DIAGNOSIS — R4781 Slurred speech: Secondary | ICD-10-CM | POA: Diagnosis present

## 2015-12-29 DIAGNOSIS — F10920 Alcohol use, unspecified with intoxication, uncomplicated: Secondary | ICD-10-CM

## 2015-12-29 LAB — RAPID URINE DRUG SCREEN, HOSP PERFORMED
Amphetamines: NOT DETECTED
Barbiturates: NOT DETECTED
Benzodiazepines: NOT DETECTED
Cocaine: NOT DETECTED
OPIATES: NOT DETECTED
Tetrahydrocannabinol: NOT DETECTED

## 2015-12-29 LAB — DIFFERENTIAL
BASOS ABS: 0.1 10*3/uL (ref 0.0–0.1)
Basophils Relative: 1 %
EOS ABS: 0.2 10*3/uL (ref 0.0–0.7)
Eosinophils Relative: 6 %
LYMPHS ABS: 1.8 10*3/uL (ref 0.7–4.0)
LYMPHS PCT: 49 %
MONOS PCT: 20 %
Monocytes Absolute: 0.7 10*3/uL (ref 0.1–1.0)
NEUTROS ABS: 0.9 10*3/uL — AB (ref 1.7–7.7)
NEUTROS PCT: 25 %

## 2015-12-29 LAB — COMPREHENSIVE METABOLIC PANEL
ALBUMIN: 3.7 g/dL (ref 3.5–5.0)
ALK PHOS: 111 U/L (ref 38–126)
ALT: 57 U/L — AB (ref 14–54)
AST: 81 U/L — AB (ref 15–41)
Anion gap: 13 (ref 5–15)
BILIRUBIN TOTAL: 0.3 mg/dL (ref 0.3–1.2)
BUN: 22 mg/dL — ABNORMAL HIGH (ref 6–20)
CALCIUM: 9.4 mg/dL (ref 8.9–10.3)
CO2: 22 mmol/L (ref 22–32)
Chloride: 100 mmol/L — ABNORMAL LOW (ref 101–111)
Creatinine, Ser: 1.3 mg/dL — ABNORMAL HIGH (ref 0.44–1.00)
GFR calc Af Amer: 47 mL/min — ABNORMAL LOW (ref >60)
GFR, EST NON AFRICAN AMERICAN: 41 mL/min — AB (ref >60)
Glucose, Bld: 89 mg/dL (ref 65–99)
POTASSIUM: 3.8 mmol/L (ref 3.5–5.1)
Sodium: 135 mmol/L (ref 135–145)
TOTAL PROTEIN: 7.2 g/dL (ref 6.5–8.1)

## 2015-12-29 LAB — URINALYSIS, ROUTINE W REFLEX MICROSCOPIC
Bilirubin Urine: NEGATIVE
Glucose, UA: NEGATIVE mg/dL
Hgb urine dipstick: NEGATIVE
Ketones, ur: NEGATIVE mg/dL
LEUKOCYTES UA: NEGATIVE
NITRITE: NEGATIVE
PH: 5 (ref 5.0–8.0)
Protein, ur: NEGATIVE mg/dL
SPECIFIC GRAVITY, URINE: 1.007 (ref 1.005–1.030)

## 2015-12-29 LAB — I-STAT CHEM 8, ED
BUN: 24 mg/dL — AB (ref 6–20)
CREATININE: 1.5 mg/dL — AB (ref 0.44–1.00)
Calcium, Ion: 1.14 mmol/L (ref 1.13–1.30)
Chloride: 99 mmol/L — ABNORMAL LOW (ref 101–111)
GLUCOSE: 84 mg/dL (ref 65–99)
HEMATOCRIT: 42 % (ref 36.0–46.0)
HEMOGLOBIN: 14.3 g/dL (ref 12.0–15.0)
POTASSIUM: 3.7 mmol/L (ref 3.5–5.1)
Sodium: 137 mmol/L (ref 135–145)
TCO2: 24 mmol/L (ref 0–100)

## 2015-12-29 LAB — CBC
HEMATOCRIT: 38.9 % (ref 36.0–46.0)
HEMOGLOBIN: 13.2 g/dL (ref 12.0–15.0)
MCH: 31.7 pg (ref 26.0–34.0)
MCHC: 33.9 g/dL (ref 30.0–36.0)
MCV: 93.3 fL (ref 78.0–100.0)
Platelets: 127 10*3/uL — ABNORMAL LOW (ref 150–400)
RBC: 4.17 MIL/uL (ref 3.87–5.11)
RDW: 12.6 % (ref 11.5–15.5)
WBC: 3.8 10*3/uL — ABNORMAL LOW (ref 4.0–10.5)

## 2015-12-29 LAB — PROTIME-INR
INR: 1.2 (ref 0.00–1.49)
Prothrombin Time: 15.3 seconds — ABNORMAL HIGH (ref 11.6–15.2)

## 2015-12-29 LAB — ETHANOL: Alcohol, Ethyl (B): 239 mg/dL — ABNORMAL HIGH (ref <5)

## 2015-12-29 LAB — APTT: APTT: 27 s (ref 24–37)

## 2015-12-29 LAB — I-STAT TROPONIN, ED: TROPONIN I, POC: 0 ng/mL (ref 0.00–0.08)

## 2015-12-29 LAB — CBG MONITORING, ED: Glucose-Capillary: 65 mg/dL (ref 65–99)

## 2015-12-29 IMAGING — CT CT HEAD W/O CM
2 series · 16 of 30 positions shown, 20 images · non-contrast
Comparison: [DATE].

ADDENDUM:
These results were called by telephone at the time of interpretation
on [DATE] at [DATE] to Dr. LEWIS, who verbally acknowledged
these results.
CLINICAL DATA: Severe headache. Right-sided weakness. Found on the
floor.

EXAM:
CT HEAD WITHOUT CONTRAST
TECHNIQUE: Contiguous axial images were obtained from the base of the skull
through the vertex without intravenous contrast.

[Series 201: head w/o, idose (1) · axial · non-contrast · 0.43mm/px · z∈[+94,+224]mm · 13 of 32 slices shown, 17 images]
[im 3/32  brain]
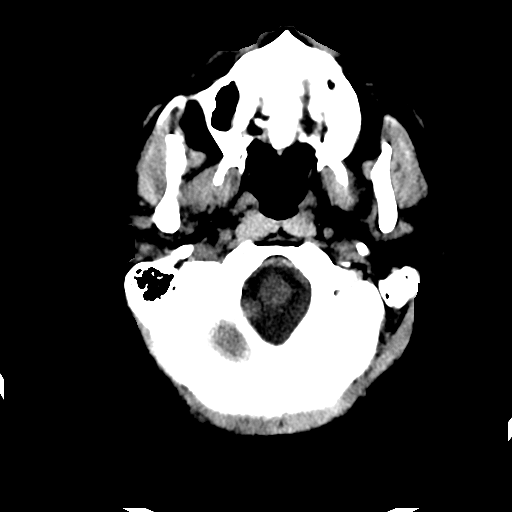
[im 3/32  bone]
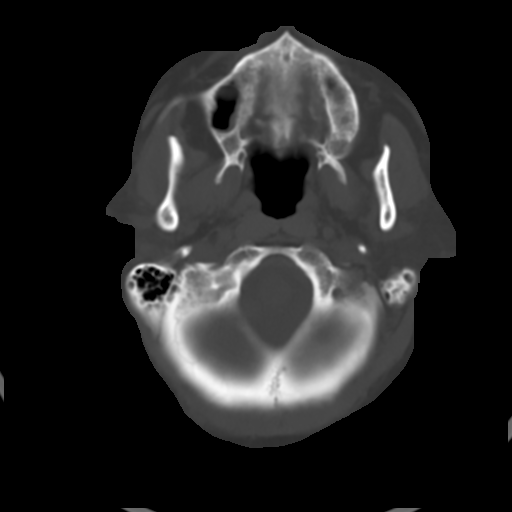
[im 5/32  brain]
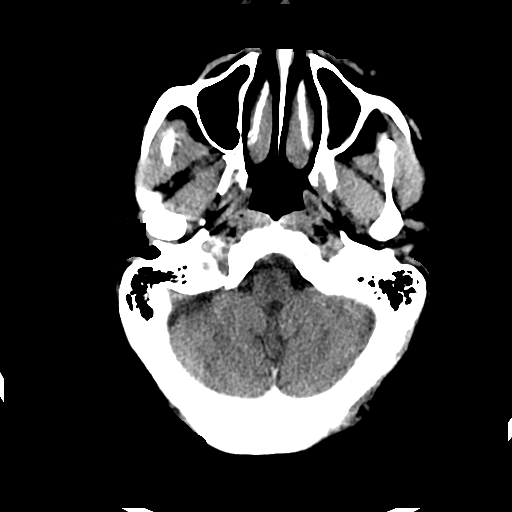
[im 7/32  brain]
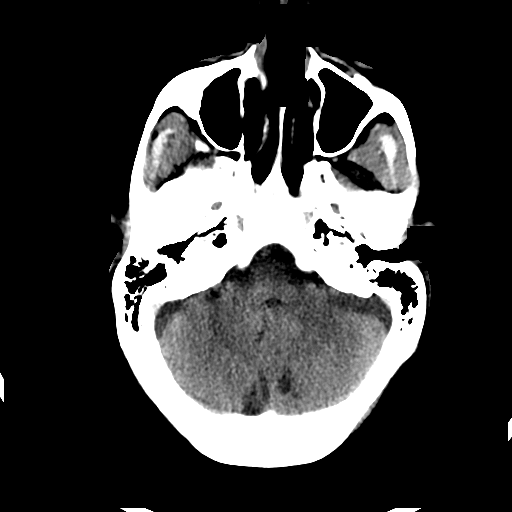
[im 9/32  brain]
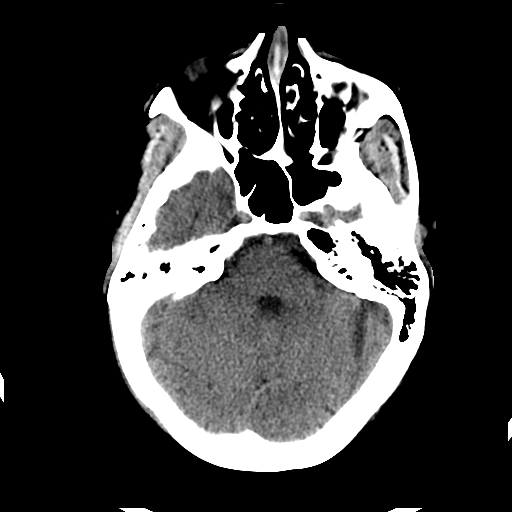
[im 12/32  brain]
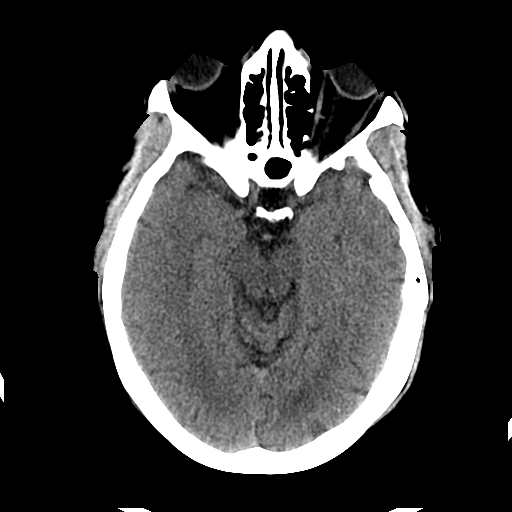
[im 12/32  bone]
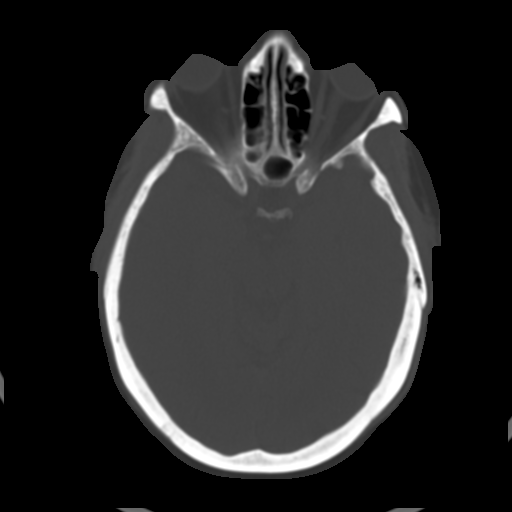
[im 14/32  brain]
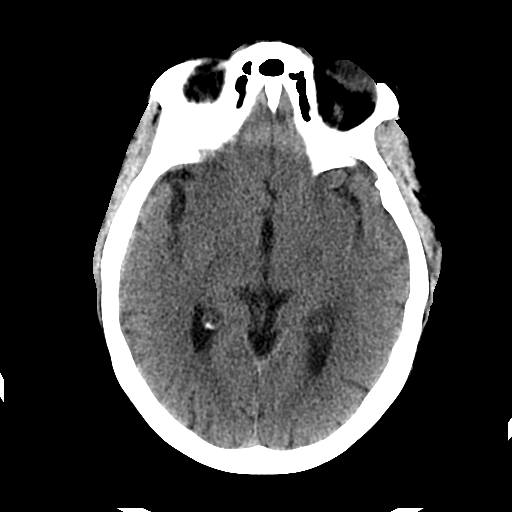
[im 16/32  brain]
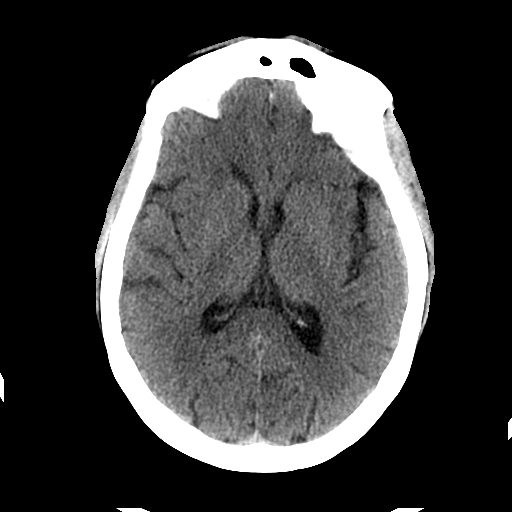
[im 18/32  brain]
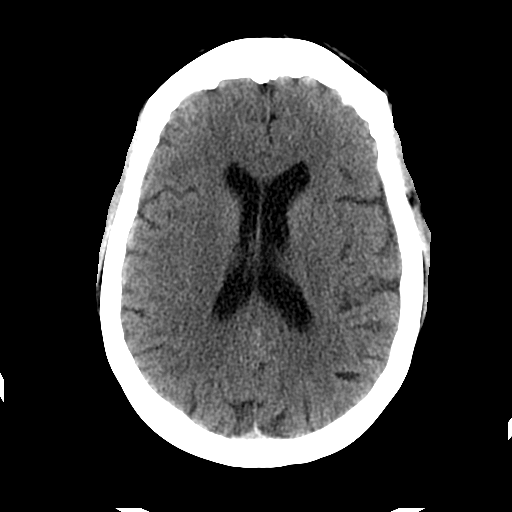
[im 20/32  brain]
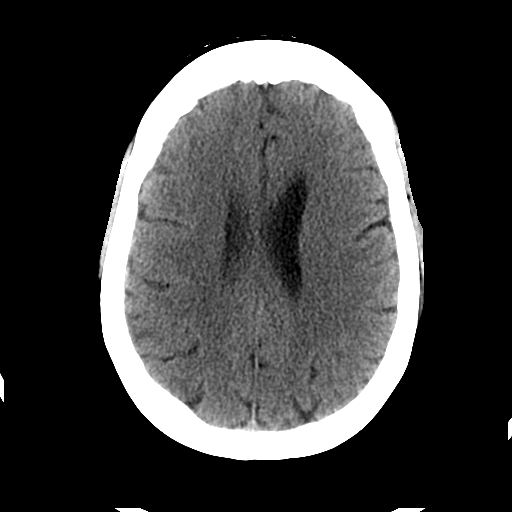
[im 20/32  bone]
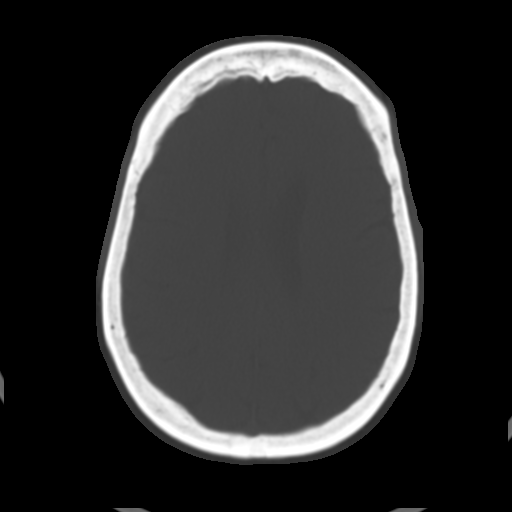
[im 23/32  brain]
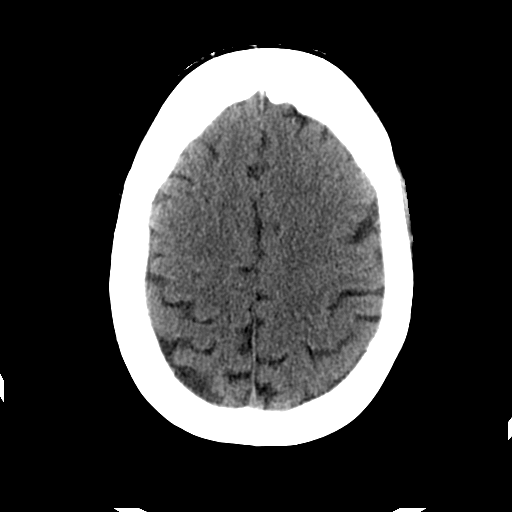
[im 25/32  brain]
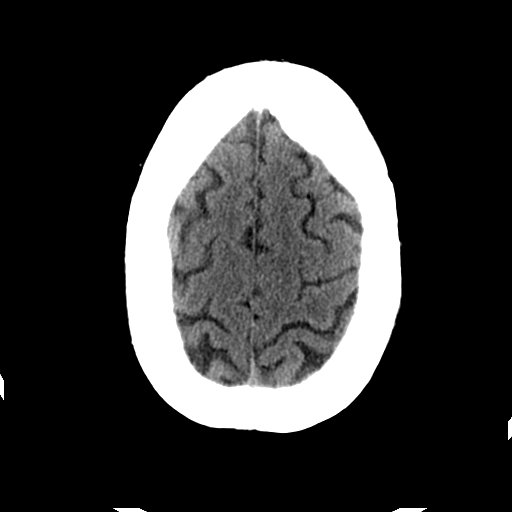
[im 27/32  brain]
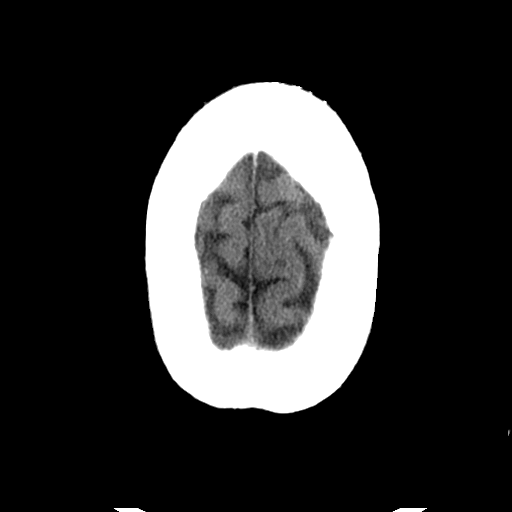
[im 29/32  brain]
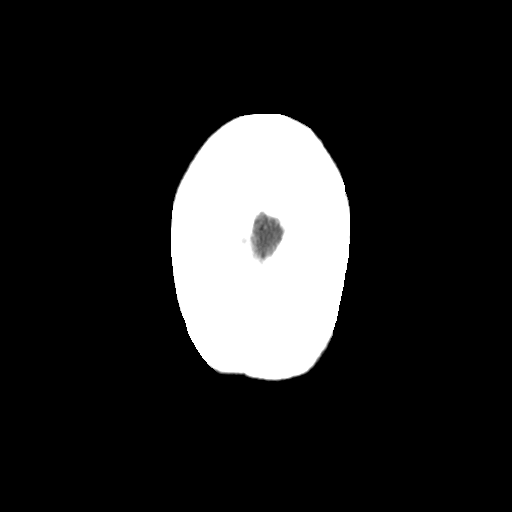
[im 29/32  bone]
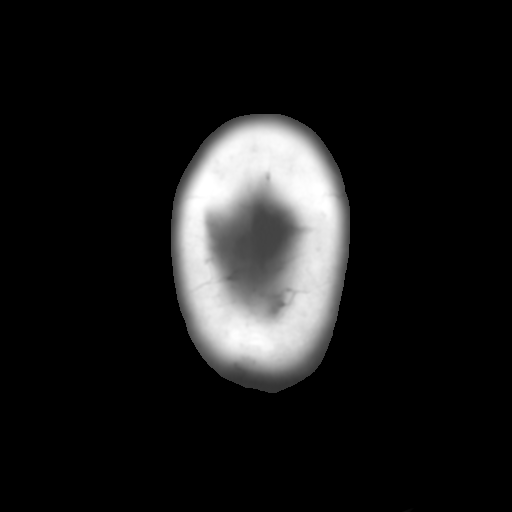

[Series 202: head w/o bone, idose (1) · axial · non-contrast · 0.43mm/px · z∈[+94,+139]mm · 3 of 32 slices shown]
[im 3/32  bone]
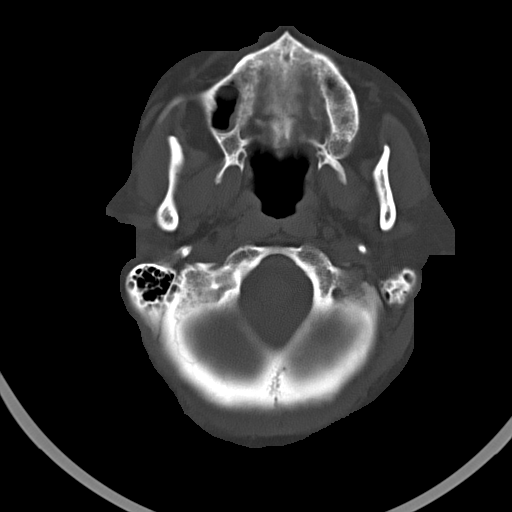
[im 7/32  bone]
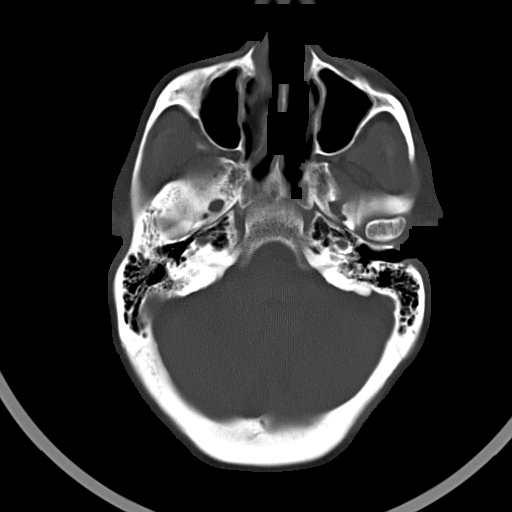
[im 12/32  bone]
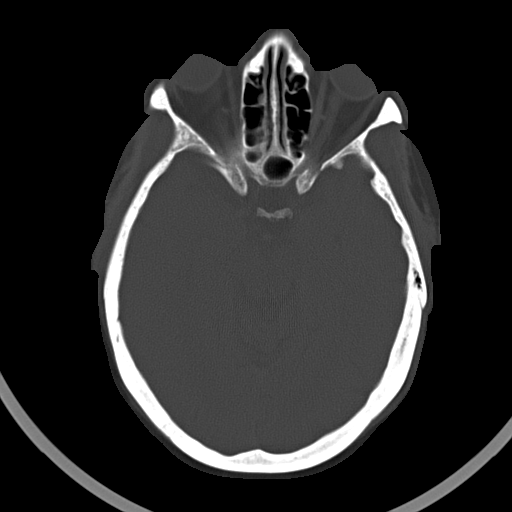

[16 of 30 positions shown; findings below may reference images not displayed]

FINDINGS: Diffusely enlarged ventricles and subarachnoid spaces. No
intracranial hemorrhage, mass lesion or CT evidence of acute
infarction. Unremarkable bones. Mild left maxillary sinus mucosal
thickening.
IMPRESSION: 1. No acute abnormality.
2. Mild diffuse cerebral atrophy.
3. Mild chronic left maxillary sinusitis.

## 2015-12-29 MED ORDER — SODIUM CHLORIDE 0.9 % IV BOLUS (SEPSIS)
1000.0000 mL | Freq: Once | INTRAVENOUS | Status: AC
  Administered 2015-12-29: 1000 mL via INTRAVENOUS

## 2015-12-29 MED ORDER — DEXTROSE 50 % IV SOLN
INTRAVENOUS | Status: AC
  Administered 2015-12-29: 50 mL
  Filled 2015-12-29: qty 50

## 2015-12-29 NOTE — ED Provider Notes (Signed)
CSN: [id number]     Arrival date & time 12/29/15  1625 History   First MD Initiated Contact with Patient 12/29/15 1630     Chief Complaint  Patient presents with  . Code Stroke    Patient is a 70 y.o. female presenting with neurologic complaint. The history is provided by the patient and the EMS personnel. No language interpreter was used.  Neurologic Problem This is a new problem. The current episode started today. The problem occurs constantly. The problem has been gradually improving. Associated symptoms include weakness. Pertinent negatives include no abdominal pain, chest pain, chills, congestion, coughing, fever, headaches, joint swelling, nausea, neck pain, numbness, rash, sore throat or vomiting. Nothing aggravates the symptoms. She has tried nothing for the symptoms. The treatment provided no relief.   Level 5 exception due to patient's condition of slurred speech, decreased responsiveness, Code Stroke.    No past medical history on file. No past surgical history on file. No family history on file. Social History  Substance Use Topics  . Smoking status: Not on file  . Smokeless tobacco: Not on file  . Alcohol Use: Not on file   OB History    No data available     Review of Systems  Constitutional: Negative for fever, chills, activity change and appetite change.  HENT: Negative for congestion, dental problem, ear pain, facial swelling, hearing loss, rhinorrhea, sneezing, sore throat, trouble swallowing and voice change.   Eyes: Negative for photophobia, pain, redness and visual disturbance.  Respiratory: Negative for apnea, cough, chest tightness, shortness of breath, wheezing and stridor.   Cardiovascular: Negative for chest pain, palpitations and leg swelling.  Gastrointestinal: Negative for nausea, vomiting, abdominal pain, diarrhea, constipation, blood in stool and abdominal distention.  Endocrine: Negative for polydipsia and polyuria.  Genitourinary: Negative for  frequency, hematuria, flank pain, decreased urine volume and difficulty urinating.  Musculoskeletal: Negative for back pain, joint swelling, gait problem, neck pain and neck stiffness.  Skin: Negative for rash and wound.  Allergic/Immunologic: Negative for immunocompromised state.  Neurological: Positive for facial asymmetry, speech difficulty and weakness. Negative for dizziness, syncope, light-headedness, numbness and headaches.  Hematological: Negative for adenopathy.  Psychiatric/Behavioral: Negative for suicidal ideas, behavioral problems, confusion, sleep disturbance and agitation. The patient is not nervous/anxious.   All other systems reviewed and are negative.     Allergies  Review of patient's allergies indicates no known allergies.  Home Medications   Prior to Admission medications   Medication Sig Start Date End Date Taking? Authorizing Provider  allopurinol (ZYLOPRIM) 300 MG tablet Take 300 mg by mouth daily. 12/13/15  Yes Historical Provider, MD  aspirin 81 MG chewable tablet Chew 81 mg by mouth daily.   Yes Historical Provider, MD  rosuvastatin (CRESTOR) 20 MG tablet Take 20 mg by mouth daily.   Yes Historical Provider, MD  valsartan-hydrochlorothiazide (DIOVAN-HCT) 160-25 MG tablet Take 1 tablet by mouth daily. 12/13/15  Yes Historical Provider, MD   BP 88/65 mmHg  Pulse 90  Temp(Src) 98.2 F (36.8 C) (Oral)  Resp 16  Wt 91.8 kg  SpO2 95% Physical Exam  Constitutional: She is oriented to person, place, and time. She appears well-developed and well-nourished. No distress.  HENT:  Head: Normocephalic and atraumatic.  Right Ear: External ear normal.  Left Ear: External ear normal.  Eyes: Pupils are equal, round, and reactive to light. Right eye exhibits no discharge. Left eye exhibits no discharge.  Neck: Normal range of motion. No JVD present. No tracheal deviation  present.  Cardiovascular: Normal rate, regular rhythm and normal heart sounds.  Exam reveals no friction  rub.   No murmur heard. Pulmonary/Chest: Effort normal and breath sounds normal. No stridor. No respiratory distress. She has no wheezes.  Abdominal: Soft. Bowel sounds are normal. She exhibits no distension. There is no rebound and no guarding.  Musculoskeletal: Normal range of motion. She exhibits no edema or tenderness.  Lymphadenopathy:    She has no cervical adenopathy.  Neurological: She is alert and oriented to person, place, and time. A cranial nerve deficit and sensory deficit is present. She exhibits abnormal muscle tone. Coordination normal. GCS eye subscore is 4. GCS verbal subscore is 5. GCS motor subscore is 6.  Patient with right upper extremity and right lower extremity weakness as compared to the contralateral side. No definite sensory deficits. Slurred speech.  Skin: Skin is warm and dry. No rash noted. No pallor.  Psychiatric: She has a normal mood and affect. Her behavior is normal. Judgment and thought content normal.  Nursing note and vitals reviewed.   ED Course  Procedures (including critical care time) Labs Review Labs Reviewed  ETHANOL - Abnormal; Notable for the following:    Alcohol, Ethyl (B) 239 (*)    All other components within normal limits  PROTIME-INR - Abnormal; Notable for the following:    Prothrombin Time 15.3 (*)    All other components within normal limits  CBC - Abnormal; Notable for the following:    WBC 3.8 (*)    Platelets 127 (*)    All other components within normal limits  DIFFERENTIAL - Abnormal; Notable for the following:    Neutro Abs 0.9 (*)    All other components within normal limits  COMPREHENSIVE METABOLIC PANEL - Abnormal; Notable for the following:    Chloride 100 (*)    BUN 22 (*)    Creatinine, Ser 1.30 (*)    AST 81 (*)    ALT 57 (*)    GFR calc non Af Amer 41 (*)    GFR calc Af Amer 47 (*)    All other components within normal limits  I-STAT CHEM 8, ED - Abnormal; Notable for the following:    Chloride 99 (*)     BUN 24 (*)    Creatinine, Ser 1.50 (*)    All other components within normal limits  APTT  URINE RAPID DRUG SCREEN, HOSP PERFORMED  URINALYSIS, ROUTINE W REFLEX MICROSCOPIC (NOT AT [facility])  I-STAT TROPOININ, ED  CBG MONITORING, ED    Imaging Review Ct Head Wo Contrast  12/29/2015  ADDENDUM REPORT: 12/29/2015 16:49 ADDENDUM: These results were called by telephone at the time of interpretation on 12/29/2015 at 4:45 pm to Dr. Patria Mane, who verbally acknowledged these results. Electronically Signed   By: Beckie Salts M.D.   On: 12/29/2015 16:49  12/29/2015  CLINICAL DATA:  Severe headache. Right-sided weakness. Found on the floor. EXAM: CT HEAD WITHOUT CONTRAST TECHNIQUE: Contiguous axial images were obtained from the base of the skull through the vertex without intravenous contrast. COMPARISON:  05/14/2008. FINDINGS: Diffusely enlarged ventricles and subarachnoid spaces. No intracranial hemorrhage, mass lesion or CT evidence of acute infarction. Unremarkable bones. Mild left maxillary sinus mucosal thickening. IMPRESSION: 1. No acute abnormality. 2. Mild diffuse cerebral atrophy. 3. Mild chronic left maxillary sinusitis. Electronically Signed: By: Beckie Salts M.D. On: 12/29/2015 16:44   I have personally reviewed and evaluated these images and lab results as part of my medical decision-making.  EKG Interpretation None      MDM   Final diagnoses:  Alcohol intoxication, uncomplicated (HCC)  Weakness    Patient brought by EMS for evaluation of right-sided weakness and slurred speech. Should make her choke upon arrival. Airway intact to thickness scanner. No bleed or evidence of stroke seen on CT scan.  UA negative for UTI. Blood glucose 65. Given 1 amp of D50. BMP with mildly elevated creatinine from baseline, fluids given. Alcohol level 239, likely the cause of her symptoms. INR normal.  Neurology in CT scanner on arrival. CT with no acute findings. Discussed with neurology, no further  neurologic workup necessary.  EKG with normal sinus rhythm, no ischemic changes.  No evidence of CVA on CT scan. Patient reevaluated with resolution of her symptoms. Patient denies headache, vision changes, weakness, numbness, tingling. She has a normal neurologic exam at time of discharge.  I suspect symptoms were related to alcohol ingestion. Blood sugar only mildly low do not feel this is a major contributor. Patient was able to tolerate oral intake with crackers and ginger ale at time of discharge.  I discussed with patient's daughter stated that she would drive patient home. Patient able to ambulate without difficulty prior to discharge.  Discussed case with my attending Dr. Patria Mane.  Dan Humphreys, MD 12/29/15 1610  Azalia Bilis, MD 12/29/15 [phone]

## 2015-12-29 NOTE — Discharge Instructions (Signed)

## 2015-12-29 NOTE — Consult Note (Signed)
Requesting Physician: Dr.  Patria Mane    Reason for consultation: stroke code, AMS  HPI:                                                                                                                                         Kristy [name] is an 70 y.o. female patient who  Was brought in by EMS to [facility] ED. As per EMS, Patient LSN at 1530.  Code stroke called at 1614, Patient states she tried to get up out of chair and fell, but did not hit her head. Patient c/o headache 10/10, Patient is slightly lethargic and not cooperative at this time. CBG 65 in ED, 1 amp of d50 given.   Date last known well:  12/29/15 Time last known well:  1530, per EMS.   tPA Given: No: non focal exam. ETOH level 239.     Stroke Risk Factors - hypertension  Past Medical History: No past medical history on file.  No past surgical history on file.  Family History: No family history on file.  Social History:   has no tobacco, alcohol, and drug history on file.  Allergies:  No Known Allergies   Medications:                                                                                                                        No current facility-administered medications for this encounter.  Current outpatient prescriptions:  .  allopurinol (ZYLOPRIM) 300 MG tablet, Take 300 mg by mouth daily., Disp: , Rfl: 4 .  aspirin 81 MG chewable tablet, Chew 81 mg by mouth daily., Disp: , Rfl:  .  rosuvastatin (CRESTOR) 20 MG tablet, Take 20 mg by mouth daily., Disp: , Rfl:  .  valsartan-hydrochlorothiazide (DIOVAN-HCT) 160-25 MG tablet, Take 1 tablet by mouth daily., Disp: , Rfl: 11   ROS:  History    unobtainable from patient due to mental status    Neurologic Examination:                                                                                                      Blood pressure  101/62, pulse 88, resp. rate 16, weight 91.8 kg (202 lb 6.1 oz), SpO2 98 %.  Evaluation of higher integrative functions including: Level of alertness: drowsy Oriented to time, place and person Speech: fluent, no evidence of dysarthria or aphasia noted.  Test the following cranial nerves: 2-12 grossly intact Motor examination: Normal tone, bulk, full 5/5 motor strength in all 4 extremities Examination of sensation : Normal and symmetric sensation to pinprick in all 4 extremities and on face Examination of deep tendon reflexes: 2+, normal and symmetric in all extremities, normal plantars bilaterally Test coordination: Normal finger nose testing, with no evidence of limb appendicular ataxia or abnormal involuntary movements or tremors noted.  Gait: Deferred   Lab Results: Basic Metabolic Panel:  Recent Labs Lab 12/29/15 1631 12/29/15 1635  NA 135 137  K 3.8 3.7  CL 100* 99*  CO2 22  --   GLUCOSE 89 84  BUN 22* 24*  CREATININE 1.30* 1.50*  CALCIUM 9.4  --     Liver Function Tests:  Recent Labs Lab 12/29/15 1631  AST 81*  ALT 57*  ALKPHOS 111  BILITOT 0.3  PROT 7.2  ALBUMIN 3.7   No results for input(s): LIPASE, AMYLASE in the last 168 hours. No results for input(s): AMMONIA in the last 168 hours.  CBC:  Recent Labs Lab 12/29/15 1631 12/29/15 1635  WBC 3.8*  --   NEUTROABS 0.9*  --   HGB 13.2 14.3  HCT 38.9 42.0  MCV 93.3  --   PLT 127*  --     Cardiac Enzymes: No results for input(s): CKTOTAL, CKMB, CKMBINDEX, TROPONINI in the last 168 hours.  Lipid Panel: No results for input(s): CHOL, TRIG, HDL, CHOLHDL, VLDL, LDLCALC in the last 168 hours.  CBG:  Recent Labs Lab 12/29/15 1644  GLUCAP 65    Microbiology: No results found for this or any previous visit.   Imaging: Ct Head Wo Contrast  12/29/2015  ADDENDUM REPORT: 12/29/2015 16:49 ADDENDUM: These results were called by telephone at the time of interpretation on 12/29/2015 at 4:45 pm to Dr.  Patria Mane, who verbally acknowledged these results. Electronically Signed   By: Beckie Salts M.D.   On: 12/29/2015 16:49  12/29/2015  CLINICAL DATA:  Severe headache. Right-sided weakness. Found on the floor. EXAM: CT HEAD WITHOUT CONTRAST TECHNIQUE: Contiguous axial images were obtained from the base of the skull through the vertex without intravenous contrast. COMPARISON:  05/14/2008. FINDINGS: Diffusely enlarged ventricles and subarachnoid spaces. No intracranial hemorrhage, mass lesion or CT evidence of acute infarction. Unremarkable bones. Mild left maxillary sinus mucosal thickening. IMPRESSION: 1. No acute abnormality. 2. Mild diffuse cerebral atrophy. 3. Mild chronic left maxillary sinusitis. Electronically Signed: By: Beckie Salts M.D. On: 12/29/2015 16:44    Assessment and plan:   Kristy [name]  Kristy [name] is an 70 y.o. female patient who presented with AMS.  No focal neuro deficits noted, communicating but slow. Known etoh abuse per EMR. CT head showed no acute pathology.  ETOH level 239 in ER. Her AMS likely related to etoh intoxication, and hypoglycemia.  Stroke code cancelled.  No further neuro recs. Will sign off.

## 2015-12-29 NOTE — Progress Notes (Signed)
Code stroke called at 1614, brought in by EMS to [facility] ED.  As per EMS, Patient LSN at 1530.  Patient states she tried to get up out of chair and fell, but did not hit her head.  Patient c/o headache 10/10, no focal deficits noted, patient moving all extremities, communicating but slow.  Patient is slightly lethargic and not cooperative at this time.  CBG 65 in ED, 1 amp of d50 given.  NIHSS 1. Cancelled at 1641

## 2017-04-13 ENCOUNTER — Other Ambulatory Visit: Payer: Self-pay | Admitting: Family Medicine

## 2017-04-13 DIAGNOSIS — Z1231 Encounter for screening mammogram for malignant neoplasm of breast: Secondary | ICD-10-CM

## 2017-04-20 ENCOUNTER — Ambulatory Visit
Admission: RE | Admit: 2017-04-20 | Discharge: 2017-04-20 | Disposition: A | Discharge status: Home / Self Care | Payer: BLUE CROSS/BLUE SHIELD | Source: Ambulatory Visit | Attending: Family Medicine | Admitting: Family Medicine

## 2017-04-20 DIAGNOSIS — Z1231 Encounter for screening mammogram for malignant neoplasm of breast: Secondary | ICD-10-CM

## 2019-09-07 ENCOUNTER — Other Ambulatory Visit: Payer: Self-pay | Admitting: Family Medicine

## 2019-09-07 DIAGNOSIS — E2839 Other primary ovarian failure: Secondary | ICD-10-CM

## 2019-10-17 ENCOUNTER — Other Ambulatory Visit: Payer: BLUE CROSS/BLUE SHIELD

## 2020-01-16 ENCOUNTER — Other Ambulatory Visit: Payer: BC Managed Care – PPO

## 2020-02-06 ENCOUNTER — Ambulatory Visit: Payer: BC Managed Care – PPO | Attending: Internal Medicine

## 2020-02-06 DIAGNOSIS — Z23 Encounter for immunization: Secondary | ICD-10-CM

## 2020-02-06 NOTE — Progress Notes (Signed)
   Covid-19 Vaccination Clinic  Name:  CARIZMA DUNSWORTH    MRN: [id number] DOB: [birth year]  02/06/2020  Ms. Drohan was observed post Covid-19 immunization for 15 minutes without incident. She was provided with Vaccine Information Sheet and instruction to access the V-Safe system.   Ms. Alfieri was instructed to call 911 with any severe reactions post vaccine: Marland Kitchen Difficulty breathing  . Swelling of face and throat  . A fast heartbeat  . A bad rash all over body  . Dizziness and weakness   Immunizations Administered    Name Date Dose VIS Date Route   Pfizer COVID-19 Vaccine 02/06/2020  8:56 AM 0.3 mL 10/21/2019 Intramuscular   Manufacturer: [employer], [employer]   Lot: [id number]   NDC: [address]-3

## 2020-02-09 ENCOUNTER — Ambulatory Visit (INDEPENDENT_AMBULATORY_CARE_PROVIDER_SITE_OTHER): Payer: BC Managed Care – PPO | Admitting: Podiatry

## 2020-02-09 DIAGNOSIS — Z5329 Procedure and treatment not carried out because of patient's decision for other reasons: Secondary | ICD-10-CM

## 2020-02-09 NOTE — Progress Notes (Signed)
No show for appt. 

## 2020-02-28 ENCOUNTER — Ambulatory Visit: Payer: BC Managed Care – PPO | Attending: Internal Medicine

## 2020-02-28 DIAGNOSIS — Z23 Encounter for immunization: Secondary | ICD-10-CM

## 2020-02-28 NOTE — Progress Notes (Signed)
   Covid-19 Vaccination Clinic  Name:  NASHEA CHUMNEY    MRN: [id number] DOB: Jan 07, 1946  02/28/2020  Ms. Lejeune was observed post Covid-19 immunization for 15 minutes without incident. She was provided with Vaccine Information Sheet and instruction to access the V-Safe system.   Ms. Saintil was instructed to call 911 with any severe reactions post vaccine: Marland Kitchen Difficulty breathing  . Swelling of face and throat  . A fast heartbeat  . A bad rash all over body  . Dizziness and weakness   Immunizations Administered    Name Date Dose VIS Date Route   Pfizer COVID-19 Vaccine 02/28/2020 12:06 PM 0.3 mL 01/04/2019 Intramuscular   Manufacturer: [employer], [employer]   Lot: [id number]   NDC: [address]-4

## 2021-06-10 ENCOUNTER — Emergency Department (HOSPITAL_COMMUNITY): Payer: Medicare Other

## 2021-06-10 ENCOUNTER — Inpatient Hospital Stay (HOSPITAL_COMMUNITY)
Admission: EM | Admit: 2021-06-10 | Discharge: 2021-07-11 | DRG: 028 | Disposition: A | Discharge status: Skilled Nursing Facility | Payer: Medicare Other | Attending: Internal Medicine | Admitting: Internal Medicine

## 2021-06-10 DIAGNOSIS — R509 Fever, unspecified: Secondary | ICD-10-CM

## 2021-06-10 DIAGNOSIS — M2578 Osteophyte, vertebrae: Secondary | ICD-10-CM | POA: Diagnosis present

## 2021-06-10 DIAGNOSIS — N179 Acute kidney failure, unspecified: Secondary | ICD-10-CM | POA: Diagnosis present

## 2021-06-10 DIAGNOSIS — M4622 Osteomyelitis of vertebra, cervical region: Secondary | ICD-10-CM | POA: Diagnosis present

## 2021-06-10 DIAGNOSIS — M4649 Discitis, unspecified, multiple sites in spine: Secondary | ICD-10-CM | POA: Diagnosis present

## 2021-06-10 DIAGNOSIS — E860 Dehydration: Secondary | ICD-10-CM | POA: Diagnosis present

## 2021-06-10 DIAGNOSIS — M6008 Infective myositis, other site: Secondary | ICD-10-CM | POA: Diagnosis present

## 2021-06-10 DIAGNOSIS — D72829 Elevated white blood cell count, unspecified: Secondary | ICD-10-CM

## 2021-06-10 DIAGNOSIS — Z23 Encounter for immunization: Secondary | ICD-10-CM

## 2021-06-10 DIAGNOSIS — I10 Essential (primary) hypertension: Secondary | ICD-10-CM | POA: Diagnosis present

## 2021-06-10 DIAGNOSIS — E871 Hypo-osmolality and hyponatremia: Secondary | ICD-10-CM | POA: Diagnosis not present

## 2021-06-10 DIAGNOSIS — R839 Unspecified abnormal finding in cerebrospinal fluid: Secondary | ICD-10-CM

## 2021-06-10 DIAGNOSIS — N390 Urinary tract infection, site not specified: Secondary | ICD-10-CM | POA: Diagnosis present

## 2021-06-10 DIAGNOSIS — Z8661 Personal history of infections of the central nervous system: Secondary | ICD-10-CM

## 2021-06-10 DIAGNOSIS — R339 Retention of urine, unspecified: Secondary | ICD-10-CM

## 2021-06-10 DIAGNOSIS — A419 Sepsis, unspecified organism: Secondary | ICD-10-CM

## 2021-06-10 DIAGNOSIS — G9341 Metabolic encephalopathy: Secondary | ICD-10-CM | POA: Diagnosis present

## 2021-06-10 DIAGNOSIS — E861 Hypovolemia: Secondary | ICD-10-CM | POA: Diagnosis present

## 2021-06-10 DIAGNOSIS — E8809 Other disorders of plasma-protein metabolism, not elsewhere classified: Secondary | ICD-10-CM | POA: Diagnosis not present

## 2021-06-10 DIAGNOSIS — R4182 Altered mental status, unspecified: Secondary | ICD-10-CM

## 2021-06-10 DIAGNOSIS — M4642 Discitis, unspecified, cervical region: Secondary | ICD-10-CM | POA: Diagnosis present

## 2021-06-10 DIAGNOSIS — R410 Disorientation, unspecified: Secondary | ICD-10-CM

## 2021-06-10 DIAGNOSIS — Z931 Gastrostomy status: Secondary | ICD-10-CM

## 2021-06-10 DIAGNOSIS — R531 Weakness: Secondary | ICD-10-CM

## 2021-06-10 DIAGNOSIS — G061 Intraspinal abscess and granuloma: Principal | ICD-10-CM | POA: Diagnosis present

## 2021-06-10 DIAGNOSIS — Z20822 Contact with and (suspected) exposure to covid-19: Secondary | ICD-10-CM | POA: Diagnosis present

## 2021-06-10 DIAGNOSIS — R519 Headache, unspecified: Secondary | ICD-10-CM

## 2021-06-10 DIAGNOSIS — R269 Unspecified abnormalities of gait and mobility: Secondary | ICD-10-CM | POA: Diagnosis present

## 2021-06-10 DIAGNOSIS — R8290 Unspecified abnormal findings in urine: Secondary | ICD-10-CM | POA: Diagnosis not present

## 2021-06-10 DIAGNOSIS — W010XXA Fall on same level from slipping, tripping and stumbling without subsequent striking against object, initial encounter: Secondary | ICD-10-CM | POA: Diagnosis present

## 2021-06-10 DIAGNOSIS — D6489 Other specified anemias: Secondary | ICD-10-CM | POA: Diagnosis present

## 2021-06-10 DIAGNOSIS — Z79899 Other long term (current) drug therapy: Secondary | ICD-10-CM

## 2021-06-10 DIAGNOSIS — K7010 Alcoholic hepatitis without ascites: Secondary | ICD-10-CM | POA: Diagnosis present

## 2021-06-10 DIAGNOSIS — M109 Gout, unspecified: Secondary | ICD-10-CM | POA: Diagnosis present

## 2021-06-10 DIAGNOSIS — R52 Pain, unspecified: Secondary | ICD-10-CM

## 2021-06-10 DIAGNOSIS — D696 Thrombocytopenia, unspecified: Secondary | ICD-10-CM | POA: Diagnosis not present

## 2021-06-10 DIAGNOSIS — E876 Hypokalemia: Secondary | ICD-10-CM

## 2021-06-10 DIAGNOSIS — R338 Other retention of urine: Secondary | ICD-10-CM | POA: Diagnosis not present

## 2021-06-10 DIAGNOSIS — Z9189 Other specified personal risk factors, not elsewhere classified: Secondary | ICD-10-CM

## 2021-06-10 DIAGNOSIS — Z90710 Acquired absence of both cervix and uterus: Secondary | ICD-10-CM

## 2021-06-10 DIAGNOSIS — R040 Epistaxis: Secondary | ICD-10-CM | POA: Diagnosis not present

## 2021-06-10 DIAGNOSIS — E785 Hyperlipidemia, unspecified: Secondary | ICD-10-CM | POA: Diagnosis present

## 2021-06-10 DIAGNOSIS — E870 Hyperosmolality and hypernatremia: Secondary | ICD-10-CM | POA: Diagnosis present

## 2021-06-10 DIAGNOSIS — D7589 Other specified diseases of blood and blood-forming organs: Secondary | ICD-10-CM | POA: Diagnosis not present

## 2021-06-10 DIAGNOSIS — R296 Repeated falls: Secondary | ICD-10-CM | POA: Diagnosis present

## 2021-06-10 DIAGNOSIS — M4624 Osteomyelitis of vertebra, thoracic region: Secondary | ICD-10-CM | POA: Diagnosis present

## 2021-06-10 DIAGNOSIS — I129 Hypertensive chronic kidney disease with stage 1 through stage 4 chronic kidney disease, or unspecified chronic kidney disease: Secondary | ICD-10-CM | POA: Diagnosis present

## 2021-06-10 DIAGNOSIS — E878 Other disorders of electrolyte and fluid balance, not elsewhere classified: Secondary | ICD-10-CM | POA: Diagnosis not present

## 2021-06-10 DIAGNOSIS — Z4659 Encounter for fitting and adjustment of other gastrointestinal appliance and device: Secondary | ICD-10-CM

## 2021-06-10 DIAGNOSIS — N1832 Chronic kidney disease, stage 3b: Secondary | ICD-10-CM | POA: Diagnosis present

## 2021-06-10 DIAGNOSIS — E440 Moderate protein-calorie malnutrition: Secondary | ICD-10-CM | POA: Diagnosis present

## 2021-06-10 DIAGNOSIS — K9423 Gastrostomy malfunction: Secondary | ICD-10-CM

## 2021-06-10 DIAGNOSIS — I5189 Other ill-defined heart diseases: Secondary | ICD-10-CM

## 2021-06-10 DIAGNOSIS — Z7982 Long term (current) use of aspirin: Secondary | ICD-10-CM

## 2021-06-10 DIAGNOSIS — Z7289 Other problems related to lifestyle: Secondary | ICD-10-CM

## 2021-06-10 DIAGNOSIS — Z6831 Body mass index (BMI) 31.0-31.9, adult: Secondary | ICD-10-CM

## 2021-06-10 DIAGNOSIS — A4901 Methicillin susceptible Staphylococcus aureus infection, unspecified site: Secondary | ICD-10-CM | POA: Diagnosis present

## 2021-06-10 DIAGNOSIS — D62 Acute posthemorrhagic anemia: Secondary | ICD-10-CM | POA: Diagnosis not present

## 2021-06-10 DIAGNOSIS — Z7141 Alcohol abuse counseling and surveillance of alcoholic: Secondary | ICD-10-CM

## 2021-06-10 DIAGNOSIS — R1319 Other dysphagia: Secondary | ICD-10-CM | POA: Diagnosis not present

## 2021-06-10 DIAGNOSIS — R1310 Dysphagia, unspecified: Secondary | ICD-10-CM

## 2021-06-10 DIAGNOSIS — W19XXXA Unspecified fall, initial encounter: Secondary | ICD-10-CM

## 2021-06-10 DIAGNOSIS — F17210 Nicotine dependence, cigarettes, uncomplicated: Secondary | ICD-10-CM | POA: Diagnosis present

## 2021-06-10 DIAGNOSIS — R7401 Elevation of levels of liver transaminase levels: Secondary | ICD-10-CM | POA: Diagnosis present

## 2021-06-10 DIAGNOSIS — F05 Delirium due to known physiological condition: Secondary | ICD-10-CM | POA: Diagnosis present

## 2021-06-10 DIAGNOSIS — D751 Secondary polycythemia: Secondary | ICD-10-CM | POA: Diagnosis present

## 2021-06-10 LAB — COMPREHENSIVE METABOLIC PANEL
ALT: 305 U/L — ABNORMAL HIGH (ref 0–44)
AST: 576 U/L — ABNORMAL HIGH (ref 15–41)
Albumin: 2.1 g/dL — ABNORMAL LOW (ref 3.5–5.0)
Alkaline Phosphatase: 101 U/L (ref 38–126)
Anion gap: 13 (ref 5–15)
BUN: 39 mg/dL — ABNORMAL HIGH (ref 8–23)
CO2: 24 mmol/L (ref 22–32)
Calcium: 8.8 mg/dL — ABNORMAL LOW (ref 8.9–10.3)
Chloride: 91 mmol/L — ABNORMAL LOW (ref 98–111)
Creatinine, Ser: 1.88 mg/dL — ABNORMAL HIGH (ref 0.44–1.00)
GFR, Estimated: 28 mL/min — ABNORMAL LOW (ref >60)
Glucose, Bld: 106 mg/dL — ABNORMAL HIGH (ref 70–99)
Potassium: 2.6 mmol/L — CL (ref 3.5–5.1)
Sodium: 128 mmol/L — ABNORMAL LOW (ref 135–145)
Total Bilirubin: 3 mg/dL — ABNORMAL HIGH (ref 0.3–1.2)
Total Protein: 6.8 g/dL (ref 6.5–8.1)

## 2021-06-10 LAB — CBC WITH DIFFERENTIAL/PLATELET
Abs Immature Granulocytes: 0.16 10*3/uL — ABNORMAL HIGH (ref 0.00–0.07)
Basophils Absolute: 0 10*3/uL (ref 0.0–0.1)
Basophils Relative: 0 %
Eosinophils Absolute: 0 10*3/uL (ref 0.0–0.5)
Eosinophils Relative: 0 %
HCT: 45.3 % (ref 36.0–46.0)
Hemoglobin: 15.2 g/dL — ABNORMAL HIGH (ref 12.0–15.0)
Immature Granulocytes: 2 %
Lymphocytes Relative: 3 %
Lymphs Abs: 0.3 10*3/uL — ABNORMAL LOW (ref 0.7–4.0)
MCH: 32 pg (ref 26.0–34.0)
MCHC: 33.6 g/dL (ref 30.0–36.0)
MCV: 95.4 fL (ref 80.0–100.0)
Monocytes Absolute: 1.8 10*3/uL — ABNORMAL HIGH (ref 0.1–1.0)
Monocytes Relative: 17 %
Neutro Abs: 8.4 10*3/uL — ABNORMAL HIGH (ref 1.7–7.7)
Neutrophils Relative %: 78 %
Platelets: 100 10*3/uL — ABNORMAL LOW (ref 150–400)
RBC: 4.75 MIL/uL (ref 3.87–5.11)
RDW: 11.9 % (ref 11.5–15.5)
Smear Review: DECREASED
WBC: 10.8 10*3/uL — ABNORMAL HIGH (ref 4.0–10.5)
nRBC: 0 % (ref 0.0–0.2)

## 2021-06-10 LAB — PROTIME-INR
INR: 1.2 (ref 0.8–1.2)
Prothrombin Time: 15.2 seconds (ref 11.4–15.2)

## 2021-06-10 LAB — CK: Total CK: 138 U/L (ref 38–234)

## 2021-06-10 LAB — LACTIC ACID, PLASMA: Lactic Acid, Venous: 1.9 mmol/L (ref 0.5–1.9)

## 2021-06-10 NOTE — ED Notes (Signed)
CT tech states that he cannot take her to CT scan because the pt is not on a bed and they cannot lift her onto the table. Triage RN and PA made aware.

## 2021-06-10 NOTE — ED Notes (Signed)
Pt daughter called and states that her mother forgets things easily and she believes that she is not compliant with her medications. Daughter states that she can be called for any questions regarding history, [phone], [name]

## 2021-06-10 NOTE — ED Triage Notes (Signed)
Pt BIB [facility]. Unwitnessed fall Friday, helped up by family, reportedly been in bed since. Witnessed fall again today, pain on R side of neck, bilateral upper arms 10/10. No obvious deformity noted by EMS 196/101, hx HTN 22g R hand, fent en route

## 2021-06-10 NOTE — ED Provider Notes (Addendum)
Emergency Medicine Provider Triage Evaluation Note  Kristy [name] , a 75 y.o. female  was evaluated in triage.  Pt complains of witnessed fall on Friday, assisted to bed by family.  Been in bed since then, with witnessed fall again today.  Severe right-sided neck pain since fall.  No numbness tingling weakness in the extremities.  Review of Systems  Positive: Neck pain Negative: Chest pain shortness of breath palpitations, dysuria, urinary frequency urgency  Physical Exam  There were no vitals taken for this visit. Gen:   Awake, no distress   Resp:  Normal effort  MSK:   Moves extremities without difficulty  Other:  Lying flat, in c-collar.  Endorses exquisite neck pain.  PERRL, EOMI, no focal deficit on neuro exam.  Moving all 4 extremities spontaneously without difficulty.  Lungs CTA B, RRR no M/R/G.  Abdomen soft nondistended, nontender.  Medical Decision Making  Medically screening exam initiated at 8:51 PM.  Appropriate orders placed.  CHANDY TARMAN was informed that the remainder of the evaluation will be completed by another provider, this initial triage assessment does not replace that evaluation, and the importance of remaining in the ED until their evaluation is complete.  Patient is not anticoagulated, therefore she was not leveled as a level 2 trauma.  This chart was dictated using voice recognition software, Dragon. Despite the best efforts of this provider to proofread and correct errors, errors may still occur which can change documentation meaning.    Paris Lore, PA-C 06/10/21 2059    Lourene Hoston, Eugene Gavia, PA-C 06/10/21 2101    Melene Plan, DO 06/10/21 2315

## 2021-06-10 NOTE — ED Provider Notes (Signed)
Pt not taken to CT due to reports that CT tech cannot take her in the recliner and she must be in a bed.     Bush Murdoch, Boyd Kerbs 06/10/21 2302    Melene Plan, DO 06/10/21 2315

## 2021-06-11 ENCOUNTER — Emergency Department (HOSPITAL_COMMUNITY): Payer: Medicare Other

## 2021-06-11 DIAGNOSIS — N390 Urinary tract infection, site not specified: Secondary | ICD-10-CM | POA: Diagnosis present

## 2021-06-11 DIAGNOSIS — N179 Acute kidney failure, unspecified: Secondary | ICD-10-CM | POA: Diagnosis not present

## 2021-06-11 DIAGNOSIS — M6008 Infective myositis, other site: Secondary | ICD-10-CM | POA: Diagnosis not present

## 2021-06-11 DIAGNOSIS — F05 Delirium due to known physiological condition: Secondary | ICD-10-CM | POA: Diagnosis not present

## 2021-06-11 DIAGNOSIS — W010XXA Fall on same level from slipping, tripping and stumbling without subsequent striking against object, initial encounter: Secondary | ICD-10-CM | POA: Diagnosis present

## 2021-06-11 DIAGNOSIS — D696 Thrombocytopenia, unspecified: Secondary | ICD-10-CM | POA: Diagnosis not present

## 2021-06-11 DIAGNOSIS — E870 Hyperosmolality and hypernatremia: Secondary | ICD-10-CM | POA: Diagnosis present

## 2021-06-11 DIAGNOSIS — N1832 Chronic kidney disease, stage 3b: Secondary | ICD-10-CM | POA: Diagnosis not present

## 2021-06-11 DIAGNOSIS — D751 Secondary polycythemia: Secondary | ICD-10-CM | POA: Diagnosis present

## 2021-06-11 DIAGNOSIS — W19XXXA Unspecified fall, initial encounter: Secondary | ICD-10-CM

## 2021-06-11 DIAGNOSIS — Z23 Encounter for immunization: Secondary | ICD-10-CM | POA: Diagnosis not present

## 2021-06-11 DIAGNOSIS — E440 Moderate protein-calorie malnutrition: Secondary | ICD-10-CM | POA: Diagnosis not present

## 2021-06-11 DIAGNOSIS — Z20822 Contact with and (suspected) exposure to covid-19: Secondary | ICD-10-CM | POA: Diagnosis not present

## 2021-06-11 DIAGNOSIS — G061 Intraspinal abscess and granuloma: Secondary | ICD-10-CM | POA: Diagnosis not present

## 2021-06-11 DIAGNOSIS — K7010 Alcoholic hepatitis without ascites: Secondary | ICD-10-CM

## 2021-06-11 DIAGNOSIS — E860 Dehydration: Secondary | ICD-10-CM | POA: Diagnosis present

## 2021-06-11 DIAGNOSIS — R1319 Other dysphagia: Secondary | ICD-10-CM | POA: Diagnosis not present

## 2021-06-11 DIAGNOSIS — R296 Repeated falls: Secondary | ICD-10-CM | POA: Diagnosis present

## 2021-06-11 DIAGNOSIS — I129 Hypertensive chronic kidney disease with stage 1 through stage 4 chronic kidney disease, or unspecified chronic kidney disease: Secondary | ICD-10-CM | POA: Diagnosis not present

## 2021-06-11 DIAGNOSIS — M4622 Osteomyelitis of vertebra, cervical region: Secondary | ICD-10-CM | POA: Diagnosis present

## 2021-06-11 DIAGNOSIS — G9341 Metabolic encephalopathy: Secondary | ICD-10-CM | POA: Diagnosis not present

## 2021-06-11 DIAGNOSIS — M4624 Osteomyelitis of vertebra, thoracic region: Secondary | ICD-10-CM | POA: Diagnosis present

## 2021-06-11 DIAGNOSIS — E871 Hypo-osmolality and hyponatremia: Secondary | ICD-10-CM | POA: Diagnosis not present

## 2021-06-11 DIAGNOSIS — D62 Acute posthemorrhagic anemia: Secondary | ICD-10-CM | POA: Diagnosis not present

## 2021-06-11 DIAGNOSIS — E876 Hypokalemia: Secondary | ICD-10-CM | POA: Diagnosis present

## 2021-06-11 DIAGNOSIS — Z6831 Body mass index (BMI) 31.0-31.9, adult: Secondary | ICD-10-CM | POA: Diagnosis not present

## 2021-06-11 LAB — ACETAMINOPHEN LEVEL: Acetaminophen (Tylenol), Serum: <10 ug/mL — ABNORMAL LOW (ref 10–30)

## 2021-06-11 LAB — HEPATITIS PANEL, ACUTE
HCV Ab: NONREACTIVE
Hep A IgM: NONREACTIVE
Hep B C IgM: NONREACTIVE
Hepatitis B Surface Ag: NONREACTIVE

## 2021-06-11 LAB — CBC
HCT: 46.1 % — ABNORMAL HIGH (ref 36.0–46.0)
Hemoglobin: 15.8 g/dL — ABNORMAL HIGH (ref 12.0–15.0)
MCH: 32.3 pg (ref 26.0–34.0)
MCHC: 34.3 g/dL (ref 30.0–36.0)
MCV: 94.3 fL (ref 80.0–100.0)
Platelets: 106 10*3/uL — ABNORMAL LOW (ref 150–400)
RBC: 4.89 MIL/uL (ref 3.87–5.11)
RDW: 12.1 % (ref 11.5–15.5)
WBC: 13.9 10*3/uL — ABNORMAL HIGH (ref 4.0–10.5)
nRBC: 0 % (ref 0.0–0.2)

## 2021-06-11 LAB — COMPREHENSIVE METABOLIC PANEL
ALT: 315 U/L — ABNORMAL HIGH (ref 0–44)
AST: 425 U/L — ABNORMAL HIGH (ref 15–41)
Albumin: 2 g/dL — ABNORMAL LOW (ref 3.5–5.0)
Alkaline Phosphatase: 117 U/L (ref 38–126)
Anion gap: 12 (ref 5–15)
BUN: 35 mg/dL — ABNORMAL HIGH (ref 8–23)
CO2: 25 mmol/L (ref 22–32)
Calcium: 8.9 mg/dL (ref 8.9–10.3)
Chloride: 95 mmol/L — ABNORMAL LOW (ref 98–111)
Creatinine, Ser: 1.66 mg/dL — ABNORMAL HIGH (ref 0.44–1.00)
GFR, Estimated: 32 mL/min — ABNORMAL LOW (ref >60)
Glucose, Bld: 86 mg/dL (ref 70–99)
Potassium: 3.3 mmol/L — ABNORMAL LOW (ref 3.5–5.1)
Sodium: 132 mmol/L — ABNORMAL LOW (ref 135–145)
Total Bilirubin: 3 mg/dL — ABNORMAL HIGH (ref 0.3–1.2)
Total Protein: 6.9 g/dL (ref 6.5–8.1)

## 2021-06-11 LAB — ETHANOL: Alcohol, Ethyl (B): <10 mg/dL (ref <10)

## 2021-06-11 LAB — AMMONIA: Ammonia: 25 umol/L (ref 9–35)

## 2021-06-11 LAB — RESP PANEL BY RT-PCR (FLU A&B, COVID) ARPGX2
Influenza A by PCR: NEGATIVE
Influenza B by PCR: NEGATIVE
SARS Coronavirus 2 by RT PCR: NEGATIVE

## 2021-06-11 LAB — MAGNESIUM: Magnesium: 1.9 mg/dL (ref 1.7–2.4)

## 2021-06-11 LAB — SALICYLATE LEVEL: Salicylate Lvl: <7 mg/dL — ABNORMAL LOW (ref 7.0–30.0)

## 2021-06-11 IMAGING — CT CT CERVICAL SPINE W/O CM
3 series · 12 of 33 positions shown, 14 images · non-contrast
Comparison: CT head [DATE]

CLINICAL DATA: Neck trauma

EXAM:
CT HEAD WITHOUT CONTRAST
CT CERVICAL SPINE WITHOUT CONTRAST
TECHNIQUE: Multidetector CT imaging of the head and cervical spine was
performed following the standard protocol without intravenous
contrast. Multiplanar CT image reconstructions of the cervical spine
were also generated.

[Series 4: c_spine 2.0 st · axial · 0.34mm/px · z∈[-276,-146]mm · 4 of 95 slices shown, 5 images]
[im 15/95  soft-tissue]
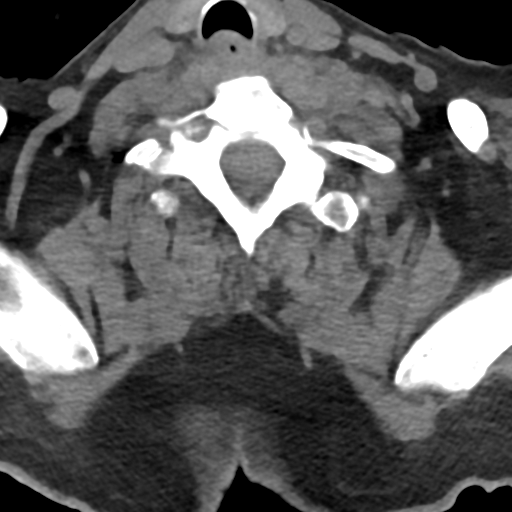
[im 15/95  bone]
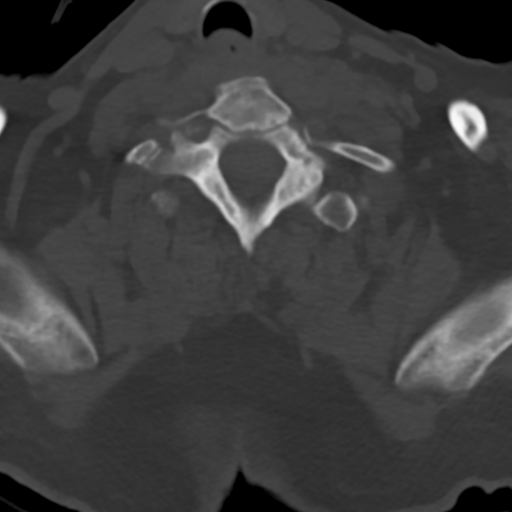
[im 37/95  bone]
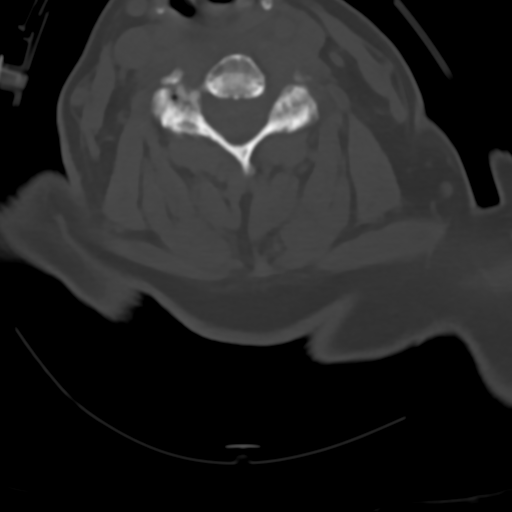
[im 58/95  bone]
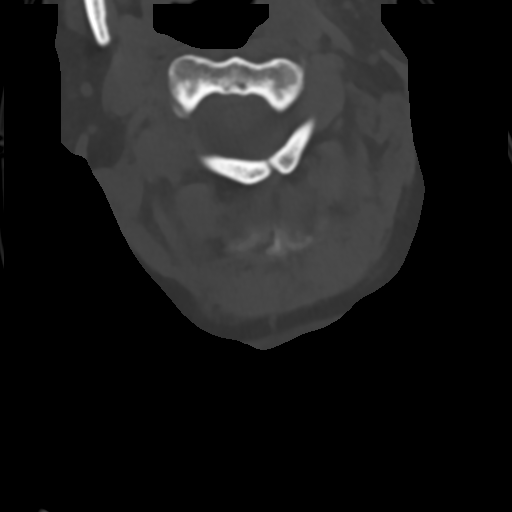
[im 80/95  bone]
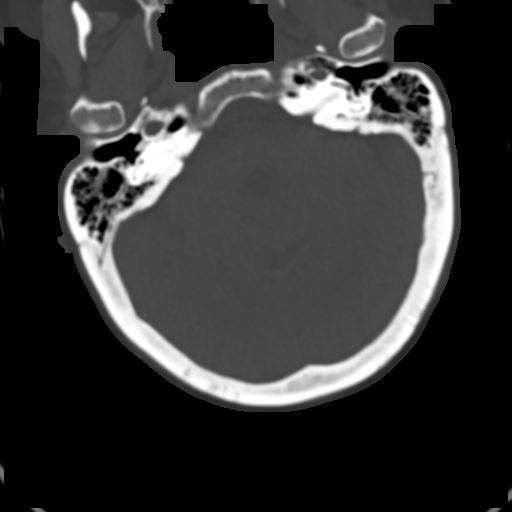

[Series 9: c_spine 2.0 sag bone · sagittal · 0.31mm/px · 5 of 61 slices shown, 6 images]
[im 21/61  bone]
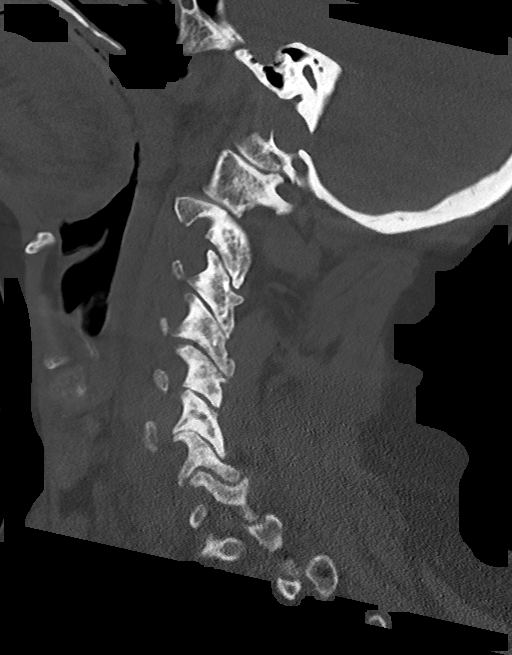
[im 26/61  bone]
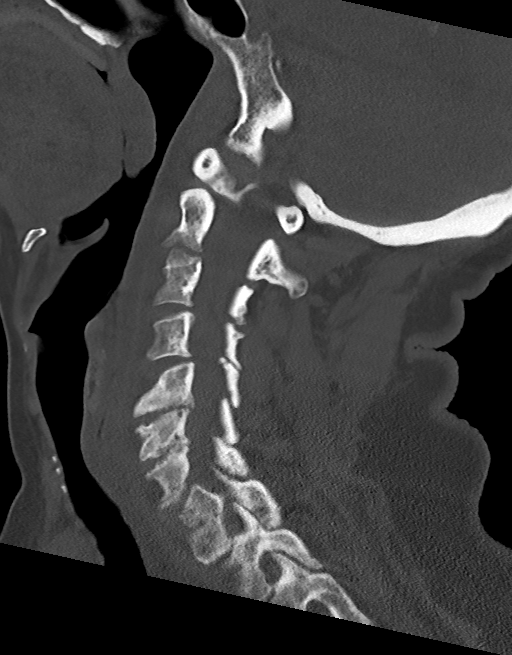
[im 31/61  soft-tissue]
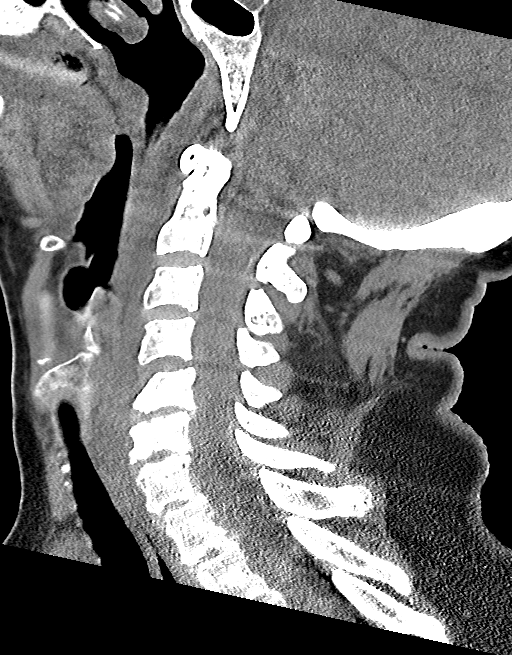
[im 31/61  bone]
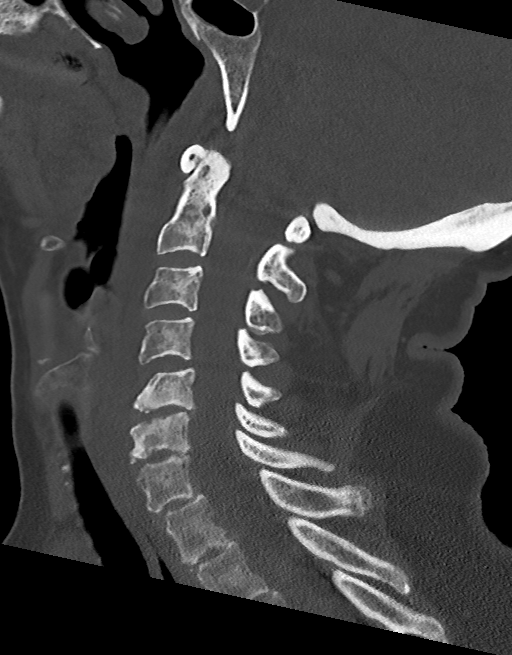
[im 36/61  bone]
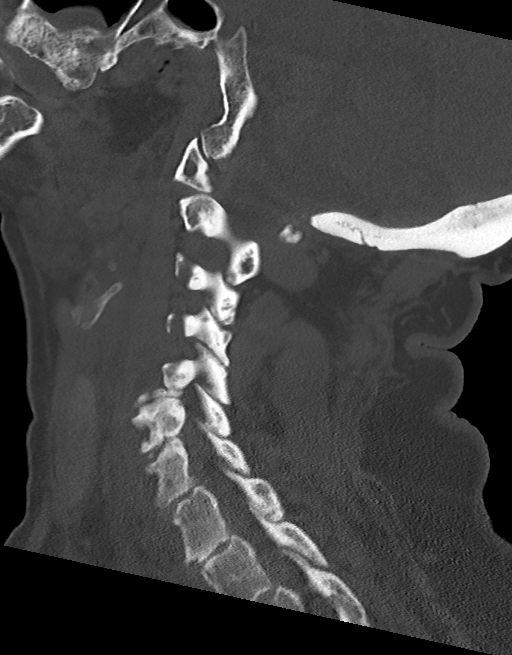
[im 41/61  bone]
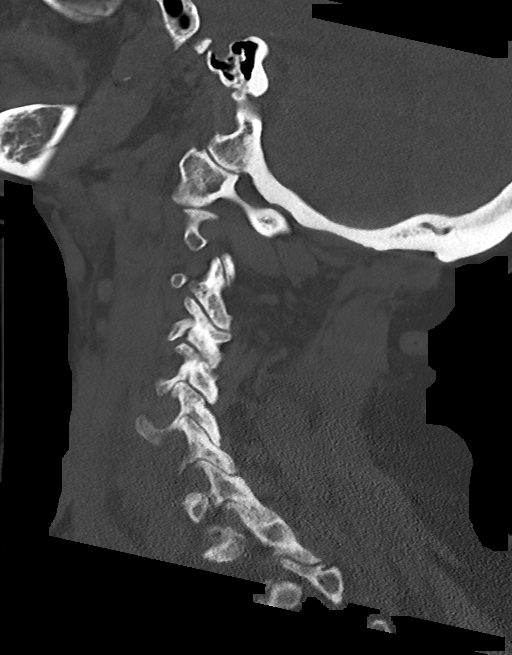

[Series 10: c_spine 2.0 cor bone · coronal · 0.24mm/px · 3 of 81 slices shown]
[im 17/81  bone]
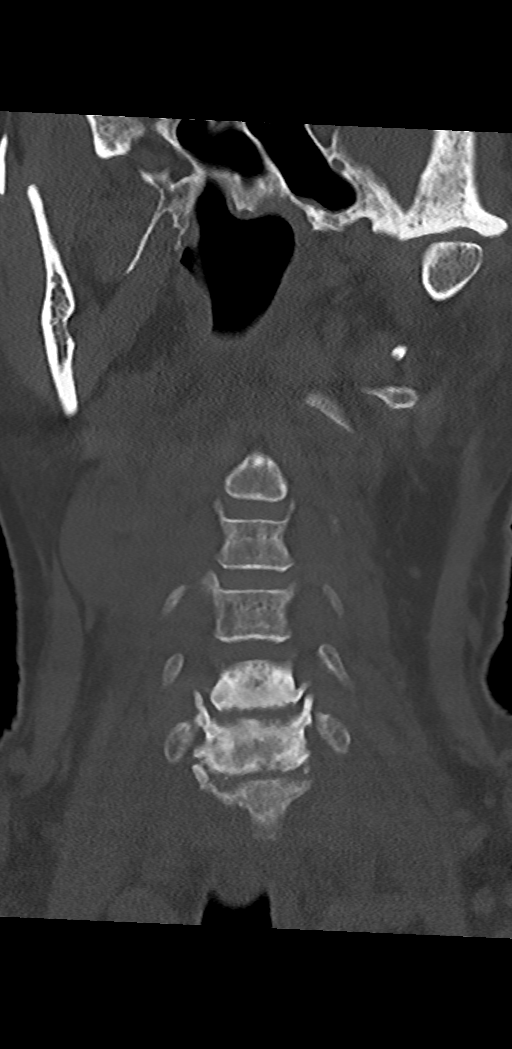
[im 33/81  bone]
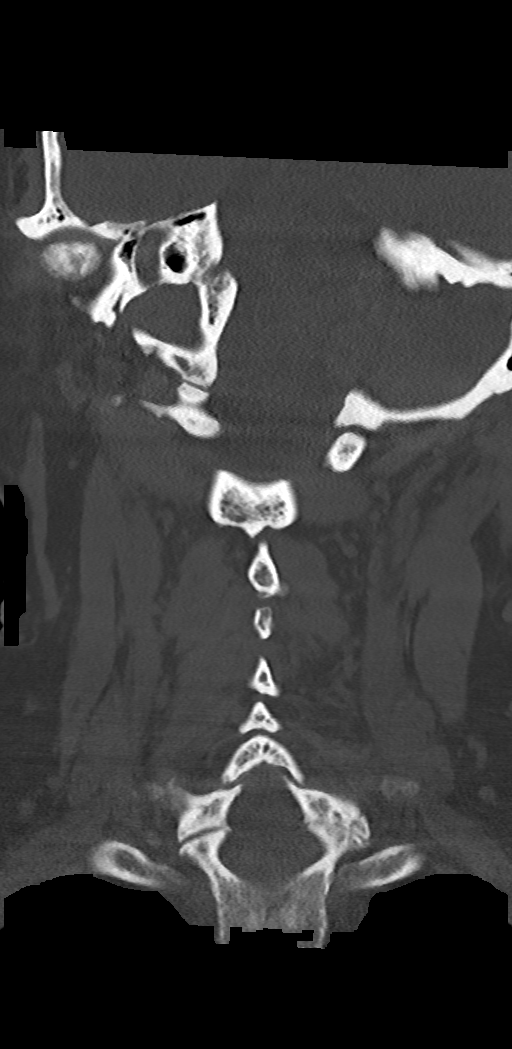
[im 49/81  bone]
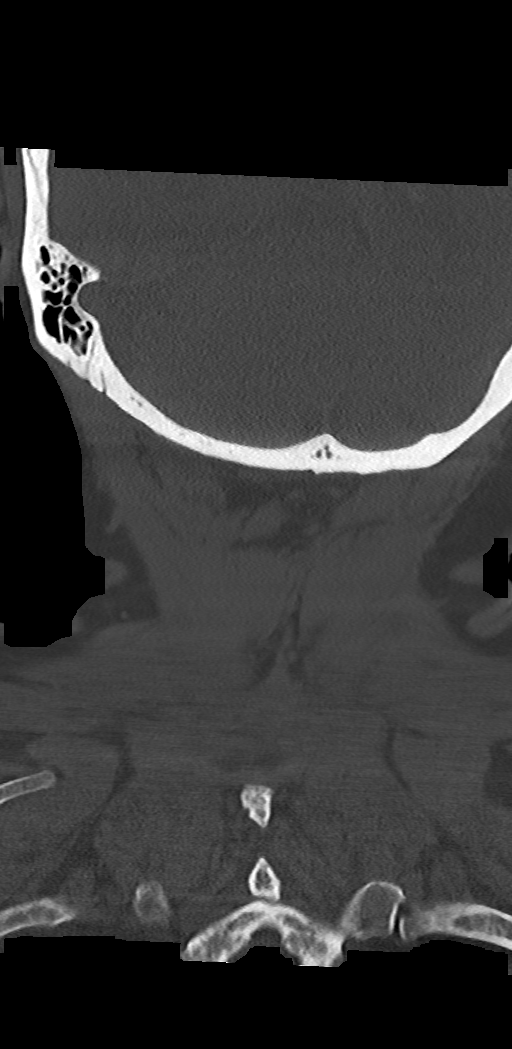

[12 of 33 positions shown; findings below may reference images not displayed]

FINDINGS: CT HEAD FINDINGS

Brain:

Cerebral ventricle sizes are concordant with the degree of cerebral
volume loss. Patchy and confluent areas of decreased attenuation are
noted throughout the deep and periventricular white matter of the
cerebral hemispheres bilaterally, compatible with chronic
microvascular ischemic disease.

No evidence of large-territorial acute infarction. No parenchymal
hemorrhage. No mass lesion. No extra-axial collection.

No mass effect or midline shift. No hydrocephalus. Basilar cisterns
are patent.

Vascular: No hyperdense vessel.

Skull: No acute fracture or focal lesion.

Sinuses/Orbits: Paranasal sinuses and mastoid air cells are clear.
The orbits are unremarkable.

Other: None.

CT CERVICAL SPINE FINDINGS

Alignment: Normal.

Skull base and vertebrae: Likely congenital non fusion of the left
posterior C1 arch. Multilevel degenerative changes of the spine. No
acute fracture. No aggressive appearing focal osseous lesion or
focal pathologic process.

Soft tissues and spinal canal: No prevertebral fluid or swelling. No
visible canal hematoma.

Upper chest: Unremarkable.

Other: None.
IMPRESSION: 1. No acute intracranial abnormality.
2. No acute displaced fracture or traumatic listhesis of the
cervical spine.

## 2021-06-11 IMAGING — CR DG CHEST 1V
1 series · 1 of 1 positions shown · non-contrast
Comparison: None.

CLINICAL DATA: frequent falls, weakness

EXAM:
CHEST  1 VIEW

[chest ap]
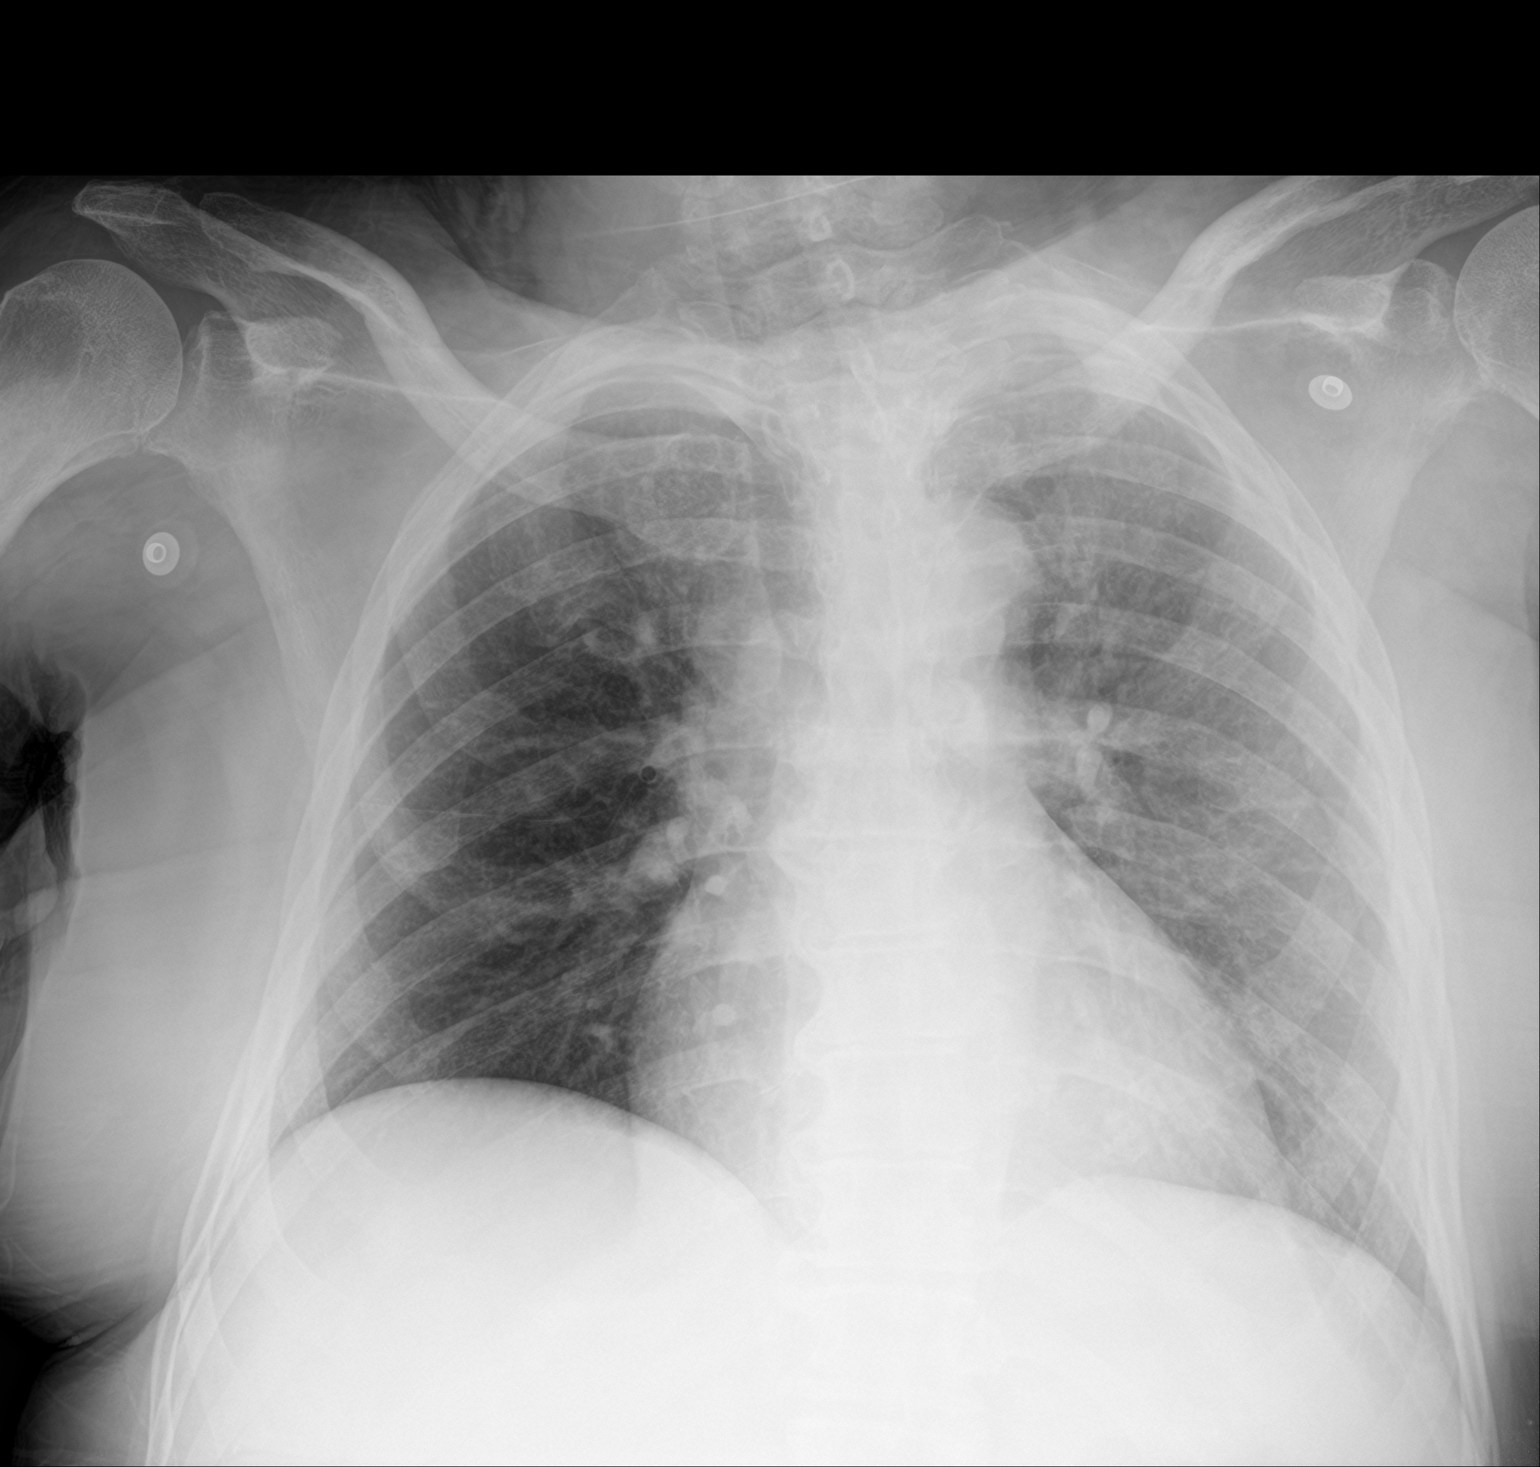

[1 of 1 positions shown; findings below may reference images not displayed]

FINDINGS: Relative opacification of the left hemithorax is likely artifactual
and related to rotation and overlying soft tissue on this
examination. The lungs are clear. No pneumothorax or pleural
effusion. Cardiac size is within normal limits. No acute bone
abnormality.
IMPRESSION: No active disease.

## 2021-06-11 IMAGING — CT CT HEAD W/O CM
4 series · 16 of 47 positions shown, 18 images · non-contrast
Comparison: CT head [DATE]

CLINICAL DATA: Neck trauma

EXAM:
CT HEAD WITHOUT CONTRAST
CT CERVICAL SPINE WITHOUT CONTRAST
TECHNIQUE: Multidetector CT imaging of the head and cervical spine was
performed following the standard protocol without intravenous
contrast. Multiplanar CT image reconstructions of the cervical spine
were also generated.

[Series 3: head without · axial · non-contrast · 0.43mm/px · z∈[-178,-38]mm · 7 of 38 slices shown, 9 images]
[im 5/38  brain]
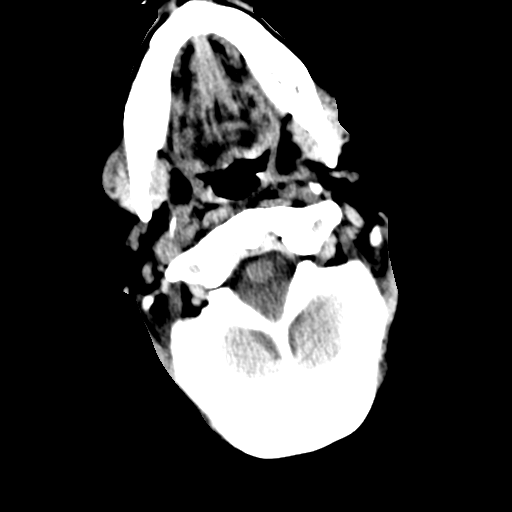
[im 5/38  bone]
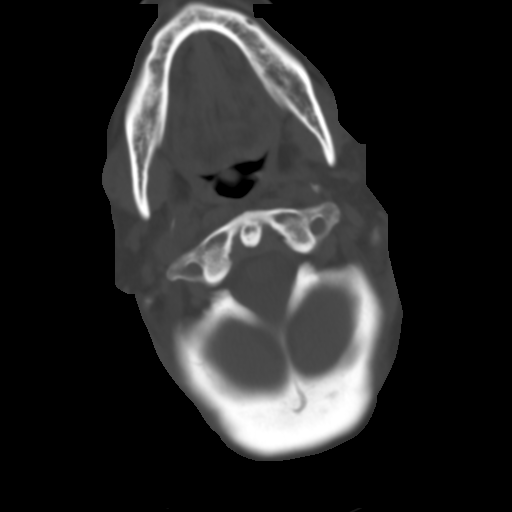
[im 10/38  brain]
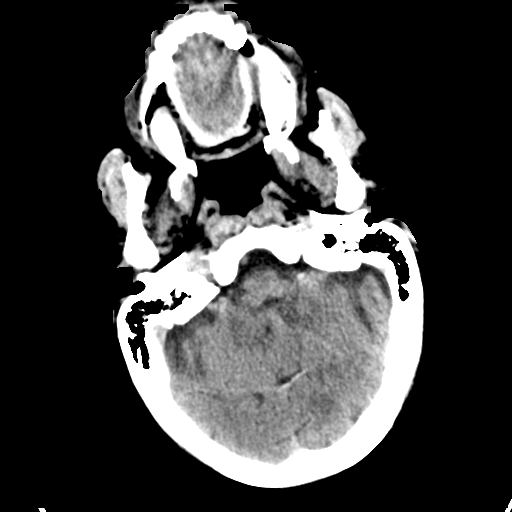
[im 14/38  brain]
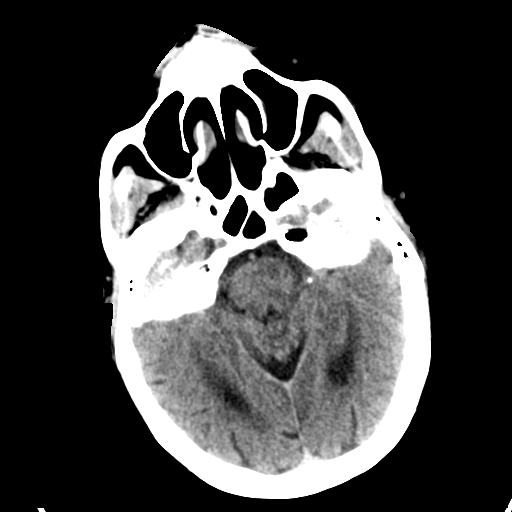
[im 19/38  brain]
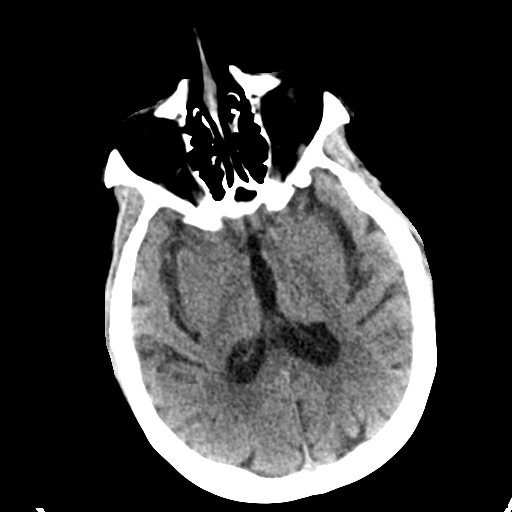
[im 24/38  brain]
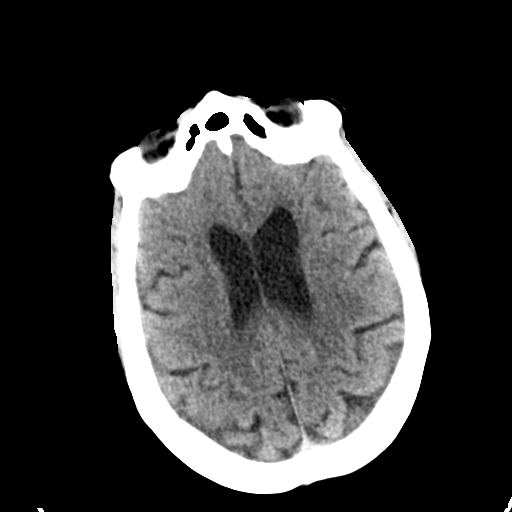
[im 24/38  bone]
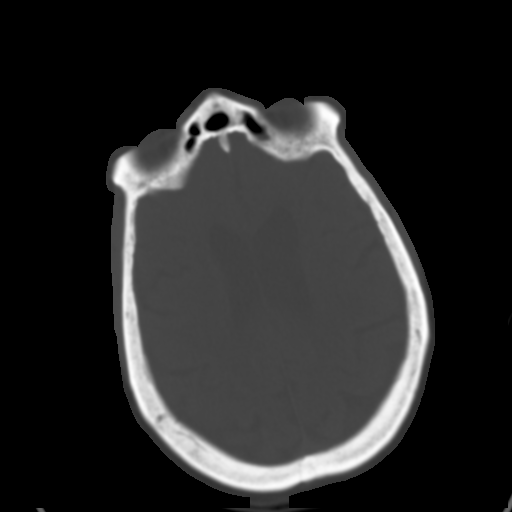
[im 28/38  brain]
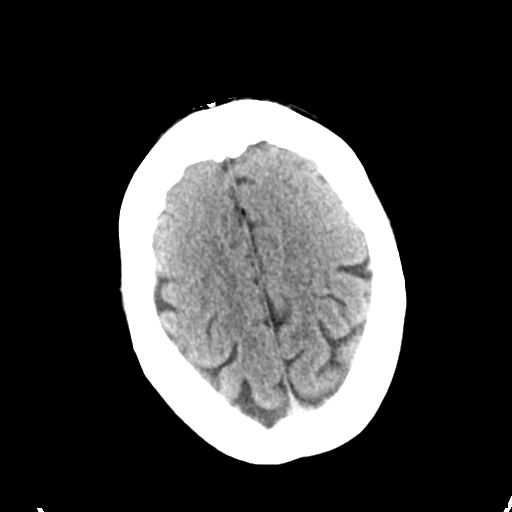
[im 33/38  brain]
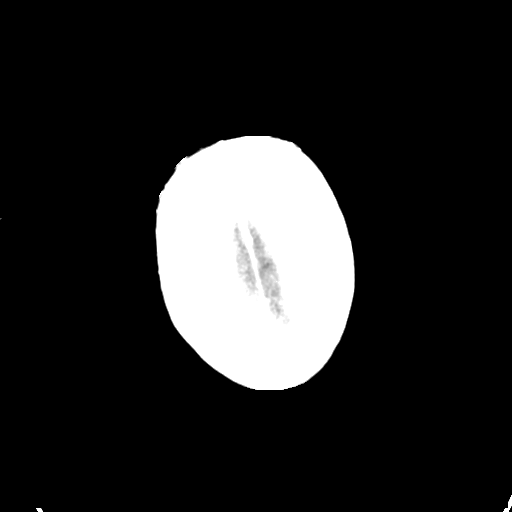

[Series 4: head bone · axial · 0.43mm/px · z∈[-180,-144]mm · 3 of 93 slices shown]
[im 10/93  bone]
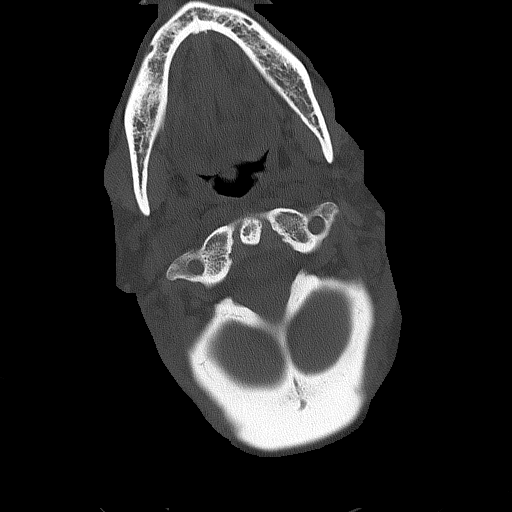
[im 19/93  bone]
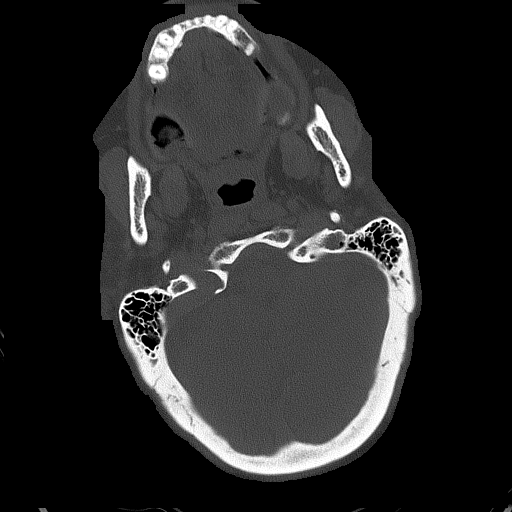
[im 28/93  bone]
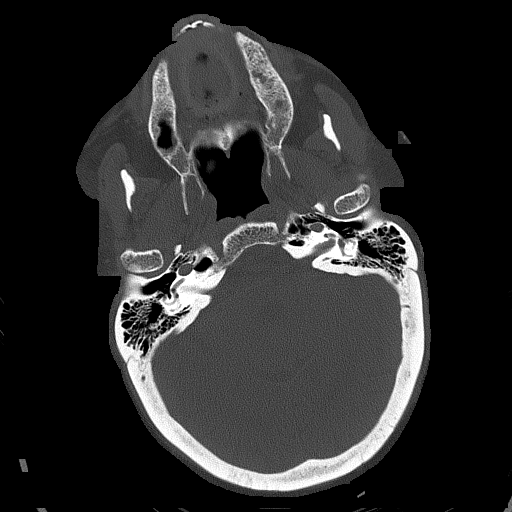

[Series 5: head without cor · coronal · non-contrast · 0.32mm/px · 3 of 67 slices shown]
[im 23/67  brain]
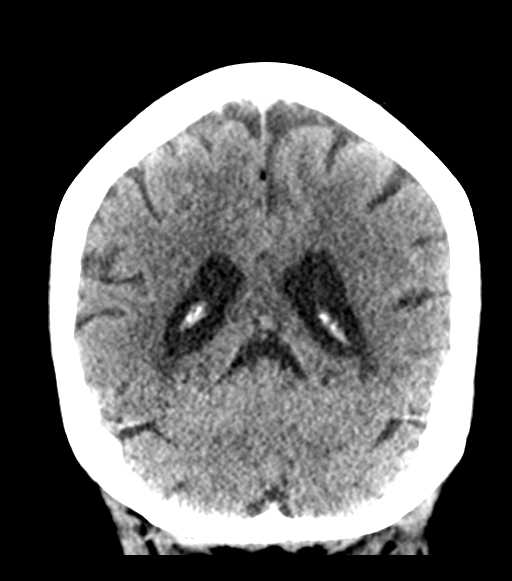
[im 30/67  brain]
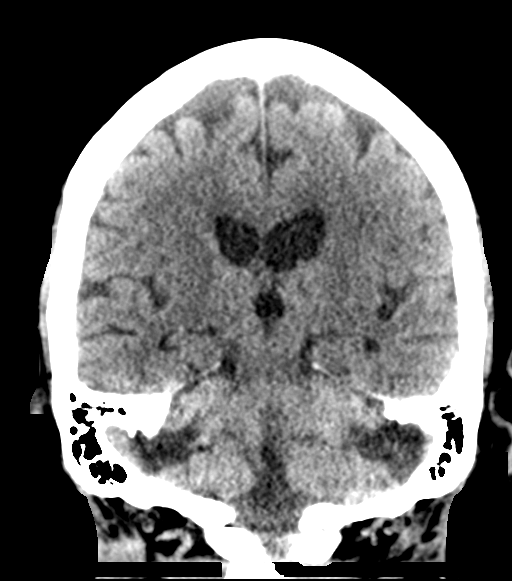
[im 37/67  brain]
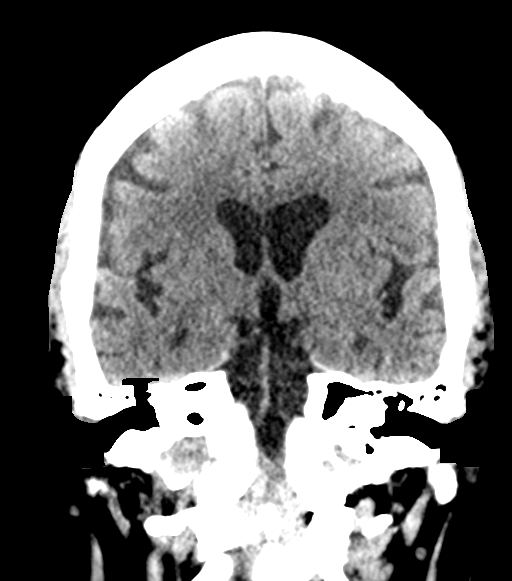

[Series 6: head without sag · sagittal · non-contrast · 0.36mm/px · 3 of 59 slices shown]
[im 20/59  brain]
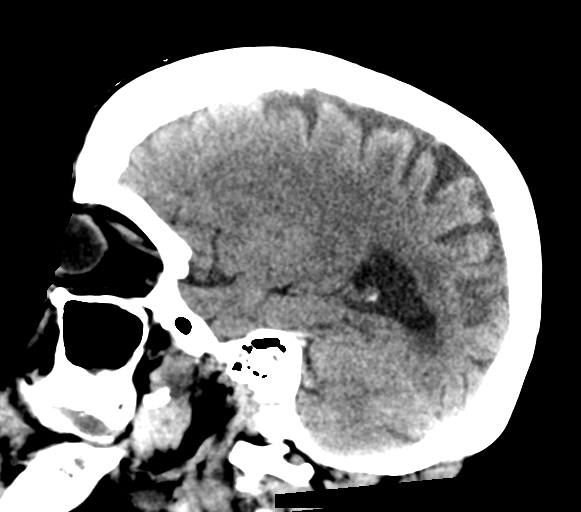
[im 30/59  brain]
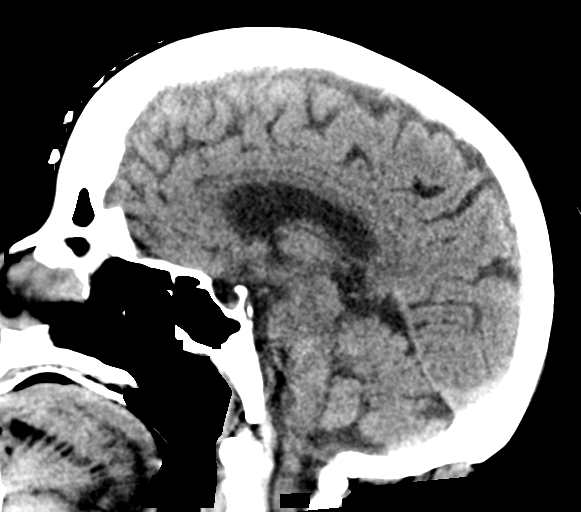
[im 39/59  brain]
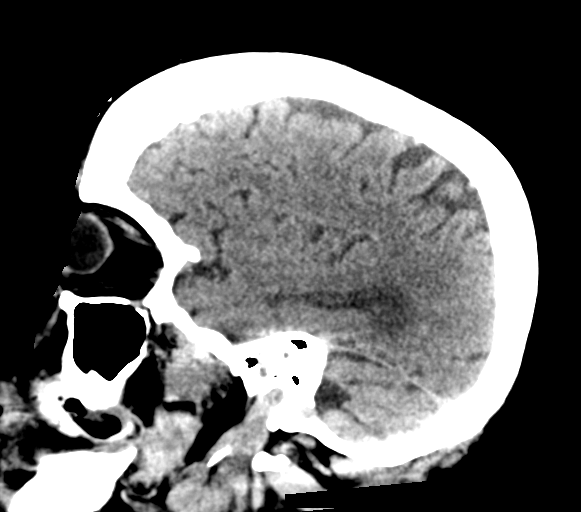

[16 of 47 positions shown; findings below may reference images not displayed]

FINDINGS: CT HEAD FINDINGS

Brain:

Cerebral ventricle sizes are concordant with the degree of cerebral
volume loss. Patchy and confluent areas of decreased attenuation are
noted throughout the deep and periventricular white matter of the
cerebral hemispheres bilaterally, compatible with chronic
microvascular ischemic disease.

No evidence of large-territorial acute infarction. No parenchymal
hemorrhage. No mass lesion. No extra-axial collection.

No mass effect or midline shift. No hydrocephalus. Basilar cisterns
are patent.

Vascular: No hyperdense vessel.

Skull: No acute fracture or focal lesion.

Sinuses/Orbits: Paranasal sinuses and mastoid air cells are clear.
The orbits are unremarkable.

Other: None.

CT CERVICAL SPINE FINDINGS

Alignment: Normal.

Skull base and vertebrae: Likely congenital non fusion of the left
posterior C1 arch. Multilevel degenerative changes of the spine. No
acute fracture. No aggressive appearing focal osseous lesion or
focal pathologic process.

Soft tissues and spinal canal: No prevertebral fluid or swelling. No
visible canal hematoma.

Upper chest: Unremarkable.

Other: None.
IMPRESSION: 1. No acute intracranial abnormality.
2. No acute displaced fracture or traumatic listhesis of the
cervical spine.

## 2021-06-11 MED ORDER — ROSUVASTATIN CALCIUM 20 MG PO TABS: 20.0000 mg | ORAL_TABLET | Freq: Every day | ORAL | Status: DC

## 2021-06-11 MED ORDER — ACETAMINOPHEN 325 MG PO TABS: 650.0000 mg | ORAL_TABLET | Freq: Four times a day (QID) | ORAL | Status: DC | PRN

## 2021-06-11 MED ORDER — THIAMINE HCL 100 MG/ML IJ SOLN
Freq: Once | INTRAVENOUS | Status: AC
  Filled 2021-06-11: qty 1000

## 2021-06-11 MED ORDER — POTASSIUM CHLORIDE CRYS ER 20 MEQ PO TBCR
40.0000 meq | EXTENDED_RELEASE_TABLET | Freq: Once | ORAL | Status: AC
  Administered 2021-06-11: 40 meq via ORAL
  Filled 2021-06-11: qty 2

## 2021-06-11 MED ORDER — ALLOPURINOL 300 MG PO TABS
300.0000 mg | ORAL_TABLET | Freq: Every day | ORAL | Status: DC
  Administered 2021-06-11 – 2021-06-13 (×3): 300 mg via ORAL
  Filled 2021-06-11: qty 3
  Filled 2021-06-11 (×3): qty 1

## 2021-06-11 MED ORDER — THIAMINE HCL 100 MG/ML IJ SOLN: 100.0000 mg | Freq: Every day | INTRAMUSCULAR | Status: DC

## 2021-06-11 MED ORDER — ONDANSETRON HCL 4 MG/2ML IJ SOLN: 4.0000 mg | Freq: Four times a day (QID) | INTRAMUSCULAR | Status: DC | PRN

## 2021-06-11 MED ORDER — FOLIC ACID 1 MG PO TABS
1.0000 mg | ORAL_TABLET | Freq: Every day | ORAL | Status: DC
  Administered 2021-06-12 – 2021-06-13 (×2): 1 mg via ORAL
  Filled 2021-06-11 (×3): qty 1

## 2021-06-11 MED ORDER — POTASSIUM CHLORIDE 10 MEQ/100ML IV SOLN
10.0000 meq | INTRAVENOUS | Status: AC
  Administered 2021-06-11 (×2): 10 meq via INTRAVENOUS
  Filled 2021-06-11 (×2): qty 100

## 2021-06-11 MED ORDER — SODIUM CHLORIDE 0.9 % IV BOLUS
500.0000 mL | Freq: Once | INTRAVENOUS | Status: AC
  Administered 2021-06-11: 500 mL via INTRAVENOUS

## 2021-06-11 MED ORDER — ASPIRIN 81 MG PO CHEW
81.0000 mg | CHEWABLE_TABLET | Freq: Every day | ORAL | Status: DC
  Administered 2021-06-11 – 2021-06-13 (×3): 81 mg via ORAL
  Filled 2021-06-11 (×4): qty 1

## 2021-06-11 MED ORDER — KCL IN DEXTROSE-NACL 20-5-0.45 MEQ/L-%-% IV SOLN
INTRAVENOUS | Status: DC
  Filled 2021-06-11 (×3): qty 1000

## 2021-06-11 MED ORDER — LORAZEPAM 1 MG PO TABS: 1.0000 mg | ORAL_TABLET | ORAL | Status: DC | PRN

## 2021-06-11 MED ORDER — LORAZEPAM 1 MG PO TABS: 0.0000 mg | ORAL_TABLET | Freq: Four times a day (QID) | ORAL | Status: DC

## 2021-06-11 MED ORDER — ENOXAPARIN SODIUM 30 MG/0.3ML IJ SOSY
30.0000 mg | PREFILLED_SYRINGE | Freq: Every day | INTRAMUSCULAR | Status: DC
Start: 2021-06-11 — End: 2021-06-15
  Administered 2021-06-11 – 2021-06-13 (×3): 30 mg via SUBCUTANEOUS
  Filled 2021-06-11 (×5): qty 0.3

## 2021-06-11 MED ORDER — AMLODIPINE BESYLATE 5 MG PO TABS
5.0000 mg | ORAL_TABLET | Freq: Every day | ORAL | Status: DC
  Administered 2021-06-11 – 2021-06-12 (×2): 5 mg via ORAL
  Filled 2021-06-11 (×3): qty 1

## 2021-06-11 MED ORDER — LORAZEPAM 2 MG/ML IJ SOLN: 1.0000 mg | INTRAMUSCULAR | Status: DC | PRN

## 2021-06-11 MED ORDER — POLYETHYLENE GLYCOL 3350 17 G PO PACK: 17.0000 g | PACK | Freq: Every day | ORAL | Status: DC | PRN

## 2021-06-11 MED ORDER — THIAMINE HCL 100 MG PO TABS
100.0000 mg | ORAL_TABLET | Freq: Every day | ORAL | Status: DC
  Administered 2021-06-12 – 2021-06-13 (×2): 100 mg via ORAL
  Filled 2021-06-11 (×3): qty 1

## 2021-06-11 MED ORDER — MELATONIN 3 MG PO TABS: 3.0000 mg | ORAL_TABLET | Freq: Every evening | ORAL | Status: DC | PRN

## 2021-06-11 MED ORDER — POTASSIUM CHLORIDE 10 MEQ/100ML IV SOLN
10.0000 meq | Freq: Once | INTRAVENOUS | Status: AC
  Administered 2021-06-11: 10 meq via INTRAVENOUS
  Filled 2021-06-11: qty 100

## 2021-06-11 MED ORDER — LORAZEPAM 1 MG PO TABS: 0.0000 mg | ORAL_TABLET | Freq: Two times a day (BID) | ORAL | Status: DC

## 2021-06-11 MED ORDER — ADULT MULTIVITAMIN W/MINERALS CH
1.0000 | ORAL_TABLET | Freq: Every day | ORAL | Status: DC
Start: 2021-06-11 — End: 2021-06-18
  Administered 2021-06-12 – 2021-06-13 (×2): 1 via ORAL
  Filled 2021-06-11 (×3): qty 1

## 2021-06-11 NOTE — Progress Notes (Signed)
06/11/21 1327  OTHER  Substance Abuse Education Offered Yes  Substance abuse interventions Educational Materials  (CAGE-AID) Substance Abuse Screening Tool  Have You Ever Felt You Ought to Cut Down on Your Drinking or Drug Use? 1  Have People Annoyed You By Critizing Your Drinking Or Drug Use? 0  Have You Felt Bad Or Guilty About Your Drinking Or Drug Use? 0  Have You Ever Had a Drink or Used Drugs First Thing In The Morning to Steady Your Nerves or to Get Rid of a Hangover? 0  CAGE-AID Score 1   CSW met with Pt at bedside. Pt endorses drinking "a beer or 2" each day before bed every day.  Pt reports that she and her husband drink together.  Pt states that she she would like to cut down on her drinking and that she would like to have more information about cutting down, but does not wish to stop altogether.   CSW provided SUD resources and discussed strategies for reducing consumption. 

## 2021-06-11 NOTE — ED Notes (Signed)
Provider at bedside

## 2021-06-11 NOTE — ED Notes (Signed)
Pt urinated on self and in the bed. Cleaned and changed at this time. While doing so pt was yelling and using foul language at staff members

## 2021-06-11 NOTE — Progress Notes (Addendum)
Occupational Therapy Evaluation  PTA pt living at home with husband. Has a history of falls. Pt apparently confused and hallucinating during evaluation. Total A +2 for bed mobility with increased c/o neck pain. Total A with ADL with exception of mod A for feeding. Pt with B hand numbness, R worse than left. Pt unable to complete hand to mouth movement pattern with BUE due to shoulder weakness. Unable to flex B shoulders at this time. Will further assess. Recommend rehab at SNF. Will follow acutely.  06/11/21 1200  OT Visit Information  Last OT Received On 06/11/21  Assistance Needed +2  PT/OT/SLP Co-Evaluation/Treatment Yes  OT goals addressed during session ADL's and self-care  History of Present Illness Pt is a 75yo female presenting to [facility] ED on 8/1 after a fall on 7/29. Pt also fell on 8/2 while in the ED. Pt found to be hypokalemic upon admission. Imaging showed no acute findings. PMH: HTN, polycthemia, alcohol use.  Precautions  Precautions Fall  Home Living  Family/patient expects to be discharged to: Private residence  Living Arrangements Spouse/significant other  Available Help at Discharge Family;Available 24 hours/day  Type of Home House  Home Access Stairs to enter  Entrance Stairs-Number of Steps 2  Entrance Stairs-Rails None  Bathroom Shower/Tub Tub/shower unit;Walk-in Pension scheme manager No  Home Equipment None  Additional Comments Unsure of accuracy given cognitive deficits.  Prior Function  Level of Independence Independent  Comments Pt reports independence with mobility and ADLs, however, unsure of accuracy given cognitive deficits.  Communication  Communication No difficulties  Pain Assessment  Faces Pain Scale 6  Pain Location neck and B shoulders  Pain Descriptors / Indicators Grimacing;Guarding;Moaning  Cognition  Arousal/Alertness Awake/alert;Lethargic  Behavior During Therapy Flat affect (Pt required multiple cues to  keep her eyes open)  Overall Cognitive Status No family/caregiver present to determine baseline cognitive functioning  General Comments Pt oriented x3, required cues to name the correct month. Likely hallucinations as she was seeing "a little boy in white on the steps". Slowed processing. Multiple cues to keep eyes open.  Lower Extremity Assessment  RLE Deficits / Details Pt unable to perform heel slide motion while semi-reclined on stretcher. Pt's ankles displayed hemosiderin staining and were cool to the touch. Pt reported decreased sensation on RLE, especially laterally,  as compared to LLE.  RLE Sensation decreased light touch  LLE Deficits / Details Pt unable to perform heel slide motion while semi-reclined on stretcher. Hemosiderin staining noted on L ankles and was cool to touch.  LLE Sensation WNL  Cervical / Trunk Assessment  Cervical / Trunk Assessment Other exceptions  Cervical / Trunk Exceptions Pt maintained neck in extension and required cues to bring head forward  ADL  Overall ADL's  Needs assistance/impaired  Functional mobility during ADLs Maximal assistance;+2 for physical assistance  General ADL Comments total A with ADL at this time  Vision- History  Baseline Vision/History No visual deficits  Bed Mobility  Overal bed mobility Needs Assistance  Bed Mobility Supine to Sit;Sit to Supine;Rolling  Rolling Total assist;+2 for physical assistance  Supine to sit +2 for physical assistance;Total assist  Sit to supine Total assist;+2 for physical assistance  General bed mobility comments Pt required total assist + 2 for LE and trunk assist for all bed mobility. Pt cried out and grimaced in pain when mobilized. Required max A to maintain sitting balance initially, however, was able to progress to min A for sitting balance. Further  mobility deferred secondary to pain. Pt rolled side to side for clean up and linen change at end of session with total A +2; pt moaning in pain  throughout.  BUE weakness, R abnormal movement patterns; unable to lift elbows off bed due to shoulder weakness; elbow/wrist/hand ROM WFL with strength @ 3+/5, R appears weaker than L; unable to complete hand to mouth pattern; pain with shoulder movement - unsure if muscular in nature  Transfers  General transfer comment unable  at this time  Balance  Overall balance assessment Needs assistance  Sitting-balance support Feet supported  Sitting balance-Leahy Scale Poor  Sitting balance - Comments Required max A to maintain initially and then required min A.  OT - End of Session  Activity Tolerance Other (comment);Patient limited by pain (limited by cognitive status)  Patient left in bed;Other (comment) Tax inspector)  Nurse Communication Mobility status  OT Assessment  OT Recommendation/Assessment Patient needs continued OT Services  OT Visit Diagnosis Unsteadiness on feet (R26.81);Other abnormalities of gait and mobility (R26.89);Repeated falls (R29.6);Muscle weakness (generalized) (M62.81);Other symptoms and signs involving cognitive function;Pain  Pain - part of body  (neck)  OT Problem List Decreased strength;Decreased range of motion;Decreased activity tolerance;Impaired balance (sitting and/or standing);Decreased coordination;Decreased cognition;Decreased safety awareness;Decreased knowledge of use of DME or AE;Decreased knowledge of precautions;Cardiopulmonary status limiting activity;Impaired sensation;Impaired tone;Impaired UE functional use;Pain;Increased edema  OT Plan  OT Frequency (ACUTE ONLY) Min 2X/week  OT Treatment/Interventions (ACUTE ONLY) Self-care/ADL training;Therapeutic exercise;Therapeutic activities;Energy conservation;DME and/or AE instruction;Cognitive remediation/compensation;Patient/family education;Balance training  AM-PAC OT "6 Clicks" Daily Activity Outcome Measure (Version 2)  Help from another person eating meals? 2  Help from another person taking care of  personal grooming? 1  Help from another person toileting, which includes using toliet, bedpan, or urinal? 1  Help from another person bathing (including washing, rinsing, drying)? 1  Help from another person to put on and taking off regular upper body clothing? 1  Help from another person to put on and taking off regular lower body clothing? 1  6 Click Score 7  Progressive Mobility  What is the highest level of mobility based on the progressive mobility assessment? Level 1 (Bedfast) - Unable to balance while sitting on edge of bed  Mobility Sit up in bed/chair position for meals  OT Recommendation  Follow Up Recommendations SNF  OT Equipment 3 in 1 bedside commode  Individuals Consulted  Consulted and Agree with Results and Recommendations Patient unable/family or caregiver not available  Acute Rehab OT Goals  Patient Stated Goal To see husband and go home  OT Goal Formulation Patient unable to participate in goal setting  Time For Goal Achievement 06/25/21  Potential to Achieve Goals Fair  OT Time Calculation  OT Start Time (ACUTE ONLY) 1045  OT Stop Time (ACUTE ONLY) 1111  OT Time Calculation (min) 26 min  OT General Charges  $OT Visit 1 Visit  OT Evaluation  $OT Eval Moderate Complexity 1 Mod  Written Expression  Dominant Hand Right  Luisa Dago, OT/L   Acute OT Clinical Specialist Acute Rehabilitation Services Pager [phone] Office [phone]

## 2021-06-11 NOTE — ED Notes (Signed)
Daughter shanterica biehler is calling for an update [phone]

## 2021-06-11 NOTE — Progress Notes (Signed)
Physical Therapy Evaluation Patient Details Name: Kristy [name] MRN: [id number] DOB: 1946-01-12 Today's Date: 06/11/2021   History of Present Illness  Pt is a 75yo female presenting to [facility] ED on 8/1 after a fall on 7/29. Pt also fell on 8/2 while in the ED. Pt found to be hypokalemic upon admission. Imaging showed no acute findings. PMH: HTN, polycthemia, alcohol use.  Clinical Impression  Pt presents with the impairments above and problems below. Pt with notable cognitive deficits with likely hallucinations. RLE displayed decreased sensation to light touch. Pt required total assist +2 for all bed mobility and cried out when mobilized. Further mobility limited secondary to pain. Due to pt's impairments and current level of assist, recommending SNF-level therapies. We will continue to follow pt acutely to promote independence with functional mobility.   Follow Up Recommendations SNF;Supervision/Assistance - 24 hour    Equipment Recommendations  Other (comment) (TBD pending progression)    Recommendations for Other Services       Precautions / Restrictions Precautions Precautions: Fall Restrictions Weight Bearing Restrictions: No      Mobility  Bed Mobility Overal bed mobility: Needs Assistance Bed Mobility: Supine to Sit;Sit to Supine;Rolling Rolling: Total assist;+2 for physical assistance   Supine to sit: +2 for physical assistance;Total assist Sit to supine: Total assist;+2 for physical assistance   General bed mobility comments: Pt required total assist + 2 for LE and trunk assist for all bed mobility. Pt cried out and grimaced in pain when mobilized. Required max A to maintain sitting balance initially, however, was able to progress to min A for sitting balance. Further mobility deferred secondary to pain. Pt rolled side to side for clean up and linen change at end of session with total A +2; pt moaning in pain throughout.    Transfers                     Ambulation/Gait                Stairs            Wheelchair Mobility    Modified Rankin (Stroke Patients Only)       Balance Overall balance assessment: Needs assistance Sitting-balance support: Feet supported Sitting balance-Leahy Scale: Poor Sitting balance - Comments: Required max A to maintain initially and then required min A.                                     Pertinent Vitals/Pain Pain Assessment: Faces Faces Pain Scale: Hurts even more Pain Location: neck and B shoulders Pain Descriptors / Indicators: Grimacing;Guarding;Moaning Pain Intervention(s): Limited activity within patient's tolerance;Monitored during session;Repositioned    Home Living Family/patient expects to be discharged to:: Private residence Living Arrangements: Spouse/significant other Available Help at Discharge: Family;Available 24 hours/day Type of Home: House Home Access: Stairs to enter Entrance Stairs-Rails: None Entrance Stairs-Number of Steps: 2   Home Equipment: None Additional Comments: Unsure of accuracy given cognitive deficits.    Prior Function Level of Independence: Independent         Comments: Pt reports independence with mobility and ADLs, however, unsure of accuracy given cognitive deficits.     Hand Dominance   Dominant Hand: Right    Extremity/Trunk Assessment   Upper Extremity Assessment Upper Extremity Assessment: Defer to OT evaluation (limited ROM in shoulders bilaterally)    Lower Extremity Assessment Lower Extremity Assessment: Generalized weakness;RLE  deficits/detail;LLE deficits/detail RLE Deficits / Details: Pt unable to perform heel slide motion while semi-reclined on stretcher. Pt's ankles displayed hemosiderin staining and were cool to the touch. Pt reported decreased sensation on RLE, especially laterally,  as compared to LLE. RLE Sensation: decreased light touch LLE Deficits / Details: Pt unable to perform heel slide  motion while semi-reclined on stretcher. Hemosiderin staining noted on L ankles and was cool to touch. LLE Sensation: WNL    Cervical / Trunk Assessment Cervical / Trunk Assessment: Other exceptions Cervical / Trunk Exceptions: Pt maintained neck in extension and required cues to bring head forward  Communication   Communication: No difficulties  Cognition Arousal/Alertness: Awake/alert;Lethargic Behavior During Therapy: Flat affect (Pt required multiple cues to keep her eyes open) Overall Cognitive Status: No family/caregiver present to determine baseline cognitive functioning                                 General Comments: Pt oriented x3, required cues to name the correct month. Likely hallucinations as she was seeing "a little boy in white on the steps". Slowed processing. Multiple cues to keep eyes open.      General Comments General comments (skin integrity, edema, etc.): Pt complained of itchy skin; skin was visualized to be very dry and cracking especially the neck and UEs. Notified RN about getting pt moisturizer    Exercises     Assessment/Plan    PT Assessment Patient needs continued PT services  PT Problem List Decreased strength;Decreased range of motion;Decreased activity tolerance;Decreased balance;Decreased mobility;Decreased coordination;Impaired sensation;Pain;Decreased knowledge of use of DME;Decreased cognition;Decreased knowledge of precautions       PT Treatment Interventions DME instruction;Gait training;Stair training;Functional mobility training;Therapeutic activities;Therapeutic exercise;Balance training;Patient/family education;Cognitive remediation    PT Goals (Current goals can be found in the Care Plan section)  Acute Rehab PT Goals Patient Stated Goal: To see husband and go home PT Goal Formulation: With patient Time For Goal Achievement: 06/25/21 Potential to Achieve Goals: Poor    Frequency Min 2X/week   Barriers to discharge         Co-evaluation PT/OT/SLP Co-Evaluation/Treatment: Yes Reason for Co-Treatment: For patient/therapist safety;To address functional/ADL transfers PT goals addressed during session: Mobility/safety with mobility;Balance         AM-PAC PT "6 Clicks" Mobility  Outcome Measure Help needed turning from your back to your side while in a flat bed without using bedrails?: Total Help needed moving from lying on your back to sitting on the side of a flat bed without using bedrails?: Total Help needed moving to and from a bed to a chair (including a wheelchair)?: Total Help needed standing up from a chair using your arms (e.g., wheelchair or bedside chair)?: Total Help needed to walk in hospital room?: Total Help needed climbing 3-5 steps with a railing? : Total 6 Click Score: 6    End of Session   Activity Tolerance: Patient limited by pain Patient left: in bed (on stretcher in ED hallway) Nurse Communication: Mobility status;Other (comment) (requested lotion for pt's skin) PT Visit Diagnosis: History of falling (Z91.81);Difficulty in walking, not elsewhere classified (R26.2);Muscle weakness (generalized) (M62.81);Other abnormalities of gait and mobility (R26.89)    Time: 1962-2297 PT Time Calculation (min) (ACUTE ONLY): 26 min   Charges:   PT Evaluation $PT Eval Moderate Complexity: 1 Mod          Johnn Hai, SPT  Johnn Hai 06/11/2021, 12:13 PM

## 2021-06-11 NOTE — H&P (Signed)
History and Physical  Kristy [name] MVH:[id number] DOB: [birth year] DOA: 06/10/2021  Referring physician: Allyne Gee, EDP.  PCP: Darrow Bussing, MD  Outpatient Specialists: None. Patient coming from: Home.  Chief Complaint: Falls  HPI: Kristy [name] is a 75 y.o. female with medical history significant for essential hypertension, polycythemia, alcohol use disorder, who presented to [facility] ED due to 2 mechanical falls at home.  She initially fell on Friday, 06/07/2021 and stayed in bed most of the time.  She tried to get up again on the day of presentation and fell.  Patient reports that she drinks (2)12 ounces of beer per day.  She tripped and fell both times.  She denies any palpitations.  No prodrome.  No headache or dizziness.  No fevers at home.  No abdominal pain.  She presented to the ED for further evaluation.  Imagings negative for acute fractures.  Electrolyte disturbances on lab studies.  EDP requested admission for observation due to hypokalemia and elevated LFTs.  ED Course: Temperature 98.8.  BP 199/97, pulse 87, respiration rate 16, O2 saturation 100% on room air.  Lab studies significant for serum sodium 128, potassium 2.6, BUN 39, creatinine 1.88 from 1.50 in 2017.  AST 576, ALT 305, ammonia 25.  Review of Systems: Review of systems as noted in the HPI. All other systems reviewed and are negative.  Past medical history: History of hypertension Polycythemia  Past surgical history: Hysterectomy  Social History:  Daily alcohol use since the age of 47.   No Known Allergies  Family history: No family history of bleeding disorder, wound healing problems or difficulty with anesthesia.  Prior to Admission medications   Medication Sig Start Date End Date Taking? Authorizing Provider  allopurinol (ZYLOPRIM) 300 MG tablet Take 300 mg by mouth daily. 12/13/15   [provider]  aspirin 81 MG chewable tablet Chew 81 mg by mouth daily.    [provider]  rosuvastatin  (CRESTOR) 20 MG tablet Take 20 mg by mouth daily.    [provider]  valsartan-hydrochlorothiazide (DIOVAN-HCT) 160-25 MG tablet Take 1 tablet by mouth daily. 12/13/15   [provider]    Physical Exam: BP (!) 199/97 (BP Location: Left Arm)   Pulse 87   Temp 98.8 F (37.1 C)   Resp 18   SpO2 100%   General: 75 y.o. year-old female well developed well nourished in no acute distress.  Alert and oriented x3. Cardiovascular: Regular rate and rhythm with no rubs or gallops.  No thyromegaly or JVD noted.  No lower extremity edema. 2/4 pulses in all 4 extremities. Respiratory: Clear to auscultation with no wheezes or rales. Good inspiratory effort. Abdomen: Soft nontender nondistended with normal bowel sounds x4 quadrants. Muskuloskeletal: No cyanosis, clubbing or edema noted bilaterally Neuro: CN II-XII intact, strength, sensation, reflexes Skin: Very dry skin affecting all 4 extremities worse in the lower extremities bilaterally. Psychiatry: Judgement and insight appear normal. Mood is appropriate for condition and setting          Labs on Admission:  Basic Metabolic Panel: Recent Labs  Lab 06/10/21 2105 06/11/21 0000  NA 128*  --   K 2.6*  --   CL 91*  --   CO2 24  --   GLUCOSE 106*  --   BUN 39*  --   CREATININE 1.88*  --   CALCIUM 8.8*  --   MG  --  1.9   Liver Function Tests: Recent Labs  Lab 06/10/21 2105  AST 576*  ALT 305*  ALKPHOS 101  BILITOT 3.0*  PROT 6.8  ALBUMIN 2.1*   No results for input(s): LIPASE, AMYLASE in the last 168 hours. Recent Labs  Lab 06/11/21 0000  AMMONIA 25   CBC: Recent Labs  Lab 06/10/21 2105  WBC 10.8*  NEUTROABS 8.4*  HGB 15.2*  HCT 45.3  MCV 95.4  PLT 100*   Cardiac Enzymes: Recent Labs  Lab 06/10/21 2105  CKTOTAL 138    BNP (last 3 results) No results for input(s): BNP in the last 8760 hours.  ProBNP (last 3 results) No results for input(s): PROBNP in the last 8760 hours.  CBG: No results  for input(s): GLUCAP in the last 168 hours.  Radiological Exams on Admission: DG Chest 1 View  Result Date: 06/11/2021 CLINICAL DATA:  frequent falls, weakness EXAM: CHEST  1 VIEW COMPARISON:  None. FINDINGS: Relative opacification of the left hemithorax is likely artifactual and related to rotation and overlying soft tissue on this examination. The lungs are clear. No pneumothorax or pleural effusion. Cardiac size is within normal limits. No acute bone abnormality. IMPRESSION: No active disease. Electronically Signed   By: Helyn Numbers MD   On: 06/11/2021 00:18   CT Head Wo Contrast  Result Date: 06/11/2021 CLINICAL DATA:  Neck trauma EXAM: CT HEAD WITHOUT CONTRAST CT CERVICAL SPINE WITHOUT CONTRAST TECHNIQUE: Multidetector CT imaging of the head and cervical spine was performed following the standard protocol without intravenous contrast. Multiplanar CT image reconstructions of the cervical spine were also generated. COMPARISON:  CT head 05/14/2008 FINDINGS: CT HEAD FINDINGS Brain: Cerebral ventricle sizes are concordant with the degree of cerebral volume loss. Patchy and confluent areas of decreased attenuation are noted throughout the deep and periventricular white matter of the cerebral hemispheres bilaterally, compatible with chronic microvascular ischemic disease. No evidence of large-territorial acute infarction. No parenchymal hemorrhage. No mass lesion. No extra-axial collection. No mass effect or midline shift. No hydrocephalus. Basilar cisterns are patent. Vascular: No hyperdense vessel. Skull: No acute fracture or focal lesion. Sinuses/Orbits: Paranasal sinuses and mastoid air cells are clear. The orbits are unremarkable. Other: None. CT CERVICAL SPINE FINDINGS Alignment: Normal. 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 Multilevel degenerative changes of the spine. No acute fracture. No aggressive appearing focal osseous lesion or focal pathologic  process. Soft tissues and spinal canal: No prevertebral fluid or swelling. No visible canal hematoma. Upper chest: Unremarkable. Other: None. IMPRESSION: 1. No acute intracranial abnormality. 2. No acute displaced fracture or traumatic listhesis of the cervical spine. Electronically Signed   By: Tish Frederickson M.D.   On: 06/11/2021 00:58   CT Cervical Spine Wo Contrast  Result Date: 06/11/2021 CLINICAL DATA:  Neck trauma EXAM: CT HEAD WITHOUT CONTRAST CT CERVICAL SPINE WITHOUT CONTRAST TECHNIQUE: Multidetector CT imaging of the head and cervical spine was performed following the standard protocol without intravenous contrast. Multiplanar CT image reconstructions of the cervical spine were also generated. COMPARISON:  CT head 05/14/2008 FINDINGS: CT HEAD FINDINGS Brain: Cerebral ventricle sizes are concordant with the degree of cerebral volume loss. Patchy and confluent areas of decreased attenuation are noted throughout the deep and periventricular white matter of the cerebral hemispheres bilaterally, compatible with chronic microvascular ischemic disease. No evidence of large-territorial acute infarction. No parenchymal hemorrhage. No mass lesion. No extra-axial collection. No mass effect or midline shift. No hydrocephalus. Basilar cisterns are patent. Vascular: No hyperdense vessel. Skull: No acute fracture or focal lesion.  Sinuses/Orbits: Paranasal sinuses and mastoid air cells are clear. The orbits are unremarkable. Other: None. CT CERVICAL SPINE FINDINGS Alignment: Normal. 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 Multilevel degenerative changes of the spine. No acute fracture. No aggressive appearing focal osseous lesion or focal pathologic process. Soft tissues and spinal canal: No prevertebral fluid or swelling. No visible canal hematoma. Upper chest: Unremarkable. Other: None. IMPRESSION: 1. No acute intracranial abnormality. 2. No acute displaced fracture or  traumatic listhesis of the cervical spine. Electronically Signed   By: Tish Frederickson M.D.   On: 06/11/2021 00:58    EKG: I independently viewed the EKG done and my findings are as followed: Sinus rhythm rate of 87.  Nonspecific ST-T changes.  QTc 454  Assessment/Plan Present on Admission:  Fall  Active Problems:   Fall  Falls with recurrence, suspect contributed by alcoholism Alcohol cessation counseling done at bedside in the ED. PT OT assessment Fall precautions  Electrolyte disturbances Hypokalemia Repleted orally and intravenously Obtain magnesium level, 1.9.  AKI on CKD 3B, likely prerenal in the setting of poor oral intake from dehydration Baseline creatinine appears to be 1.5 with GFR 47 Presented with creatinine of 1.88 with GFR of 28. Avoid nephrotoxic agent, dehydration and hypotension Repeat chemistry panel in the morning  Elevated liver enzymes in the setting of alcohol abuse AST and ALT elevated Repeat CMP in the morning  Essential hypertension BP is not at goal, elevated Hold off home HCTZ and valsartan in the setting of AKI. Norvasc 5 mg daily. Continue to closely monitor vital signs  Alcohol use disorder Alcohol cessation counseled on at bedside [facility] consulted to provide resources for sobriety. CIWA protocol IV thiamine, multivitamin, folic acid  Hyperlipidemia/gout Resume home regimen.   DVT prophylaxis: Subcu Lovenox daily.  Code Status: Full code.  Family Communication: None at bedside.  Disposition Plan: Admit to MedSurg remote telemetry.  Consults called: None  Admission status: Observation status.   Status is: Observation    Dispo: The patient is from: Home               Anticipated d/c is to: Home               Patient currently not stable for discharge.    Difficult to place patient not applicable.         Darlin Drop MD [facility] Pager [phone]  If 7PM-7AM, please contact  night-coverage [email or web address] Password [email or web address]  06/11/2021, 5:05 AM

## 2021-06-11 NOTE — ED Notes (Signed)
Patient transported to CT 

## 2021-06-11 NOTE — ED Notes (Signed)
Pt alert, NAD, calm, interactive, resps e/u, speaking in clear complete sentences. Family at [facility]. Denies pain, HA, sob, nausea, dizziness, numbness or tingling. Admits to some severe anxious, then states "anxious to get back to my husband". Updated with plan.

## 2021-06-11 NOTE — ED Notes (Signed)
Patient transported to X-ray 

## 2021-06-11 NOTE — ED Notes (Signed)
Provider at bedside. c-collar removed at this time

## 2021-06-11 NOTE — ED Notes (Signed)
Pt stated she was soiled, this tech changed out chucks and gown.

## 2021-06-11 NOTE — ED Notes (Signed)
Pt unable to answer questions appropriately except for her name. Unable to advise why she is here in the ER. Noted a c-collar in place at this time

## 2021-06-11 NOTE — ED Notes (Signed)
Returned from ct 

## 2021-06-11 NOTE — ED Notes (Signed)
Spoke with daughter Alexsis Kathman at this time and made aware of admit status

## 2021-06-11 NOTE — ED Provider Notes (Signed)
[facility]   CSN: [id number] Arrival date & time: 06/10/21  2048     History Chief Complaint  Patient presents with   Kristy [name]    Kristy [name] is a 75 y.o. female.  The history is provided by the patient and medical records.  Fall  75 year old female presenting to the ED with frequent falls.  Apparently she fell on Friday and assisted to bed by her family.  She is reportedly been in the bed all weekend.  States she has felt weak and somewhat confused.  She tried to get up today and fell again.  She is not exactly sure how she fell.  She denies any known dizziness prior to falling but states "I probably was".  She reports a lot of pain in the right side of her neck since she fell today.  She denies any numbness or weakness of her extremities.  She has no prior history of neck injuries or surgeries in the past.  She is not currently on anticoagulation.  Patient is not very clear on a lot of details about what happened prior to ED visit today.    No past medical history on file.  There are no problems to display for this patient.   No past surgical history on file.   OB History   No obstetric history on file.     No family history on file.     Home Medications Prior to Admission medications   Medication Sig Start Date End Date Taking? Authorizing Provider  allopurinol (ZYLOPRIM) 300 MG tablet Take 300 mg by mouth daily. 12/13/15   [provider]  aspirin 81 MG chewable tablet Chew 81 mg by mouth daily.    [provider]  rosuvastatin (CRESTOR) 20 MG tablet Take 20 mg by mouth daily.    [provider]  valsartan-hydrochlorothiazide (DIOVAN-HCT) 160-25 MG tablet Take 1 tablet by mouth daily. 12/13/15   [provider]    Allergies    Patient has no known allergies.  Review of Systems   Review of Systems  Musculoskeletal:  Positive for neck pain.  All other systems reviewed and are  negative.  Physical Exam Updated Vital Signs BP (!) 134/93 (BP Location: Right Arm)   Pulse 86   Temp 98.8 F (37.1 C)   Resp 19   SpO2 96%   Physical Exam Vitals and nursing note reviewed.  Constitutional:      Appearance: She is well-developed.  HENT:     Head: Normocephalic and atraumatic.     Comments: No visible head trauma Eyes:     Conjunctiva/sclera: Conjunctivae normal.     Pupils: Pupils are equal, round, and reactive to light.  Neck:     Comments: C-collar in place Cardiovascular:     Rate and Rhythm: Normal rate and regular rhythm.     Heart sounds: Normal heart sounds.  Pulmonary:     Effort: Pulmonary effort is normal.     Breath sounds: Normal breath sounds.  Abdominal:     General: Bowel sounds are normal.     Palpations: Abdomen is soft.  Musculoskeletal:        General: Normal range of motion.     Cervical back: Normal range of motion.  Skin:    General: Skin is warm and dry.     Comments: Very dry and excoriated skin to upper extremities, no discrete lesions noted  Neurological:     Comments: Awake,  alert, able to state name but is not able to answer a lot of detailed questions or state why she is here in the ED, she is able to shuffle extremities in bed but appears generally weak    ED Results / Procedures / Treatments   Labs (all labs ordered are listed, but only abnormal results are displayed) Labs Reviewed  COMPREHENSIVE METABOLIC PANEL - Abnormal; Notable for the following components:      Result Value   Sodium 128 (*)    Potassium 2.6 (*)    Chloride 91 (*)    Glucose, Bld 106 (*)    BUN 39 (*)    Creatinine, Ser 1.88 (*)    Calcium 8.8 (*)    Albumin 2.1 (*)    AST 576 (*)    ALT 305 (*)    Total Bilirubin 3.0 (*)    GFR, Estimated 28 (*)    All other components within normal limits  CBC WITH DIFFERENTIAL/PLATELET - Abnormal; Notable for the following components:   WBC 10.8 (*)    Hemoglobin 15.2 (*)    Platelets 100 (*)     Neutro Abs 8.4 (*)    Lymphs Abs 0.3 (*)    Monocytes Absolute 1.8 (*)    Abs Immature Granulocytes 0.16 (*)    All other components within normal limits  ACETAMINOPHEN LEVEL - Abnormal; Notable for the following components:   Acetaminophen (Tylenol), Serum <10 (*)    All other components within normal limits  SALICYLATE LEVEL - Abnormal; Notable for the following components:   Salicylate Lvl <7.0 (*)    All other components within normal limits  RESP PANEL BY RT-PCR (FLU A&B, COVID) ARPGX2  CK  LACTIC ACID, PLASMA  PROTIME-INR  ETHANOL  AMMONIA  MAGNESIUM  URINALYSIS, ROUTINE W REFLEX MICROSCOPIC  RAPID URINE DRUG SCREEN, HOSP PERFORMED  HEPATITIS PANEL, ACUTE    EKG None  Radiology DG Chest 1 View  Result Date: 06/11/2021 CLINICAL DATA:  frequent falls, weakness EXAM: CHEST  1 VIEW COMPARISON:  None. FINDINGS: Relative opacification of the left hemithorax is likely artifactual and related to rotation and overlying soft tissue on this examination. The lungs are clear. No pneumothorax or pleural effusion. Cardiac size is within normal limits. No acute bone abnormality. IMPRESSION: No active disease. Electronically Signed   By: Helyn Numbers MD   On: 06/11/2021 00:18   CT Head Wo Contrast  Result Date: 06/11/2021 CLINICAL DATA:  Neck trauma EXAM: CT HEAD WITHOUT CONTRAST CT CERVICAL SPINE WITHOUT CONTRAST TECHNIQUE: Multidetector CT imaging of the head and cervical spine was performed following the standard protocol without intravenous contrast. Multiplanar CT image reconstructions of the cervical spine were also generated. COMPARISON:  CT head 05/14/2008 FINDINGS: CT HEAD FINDINGS Brain: Cerebral ventricle sizes are concordant with the degree of cerebral volume loss. Patchy and confluent areas of decreased attenuation are noted throughout the deep and periventricular white matter of the cerebral hemispheres bilaterally, compatible with chronic microvascular ischemic disease. No  evidence of large-territorial acute infarction. No parenchymal hemorrhage. No mass lesion. No extra-axial collection. No mass effect or midline shift. No hydrocephalus. Basilar cisterns are patent. Vascular: No hyperdense vessel. Skull: No acute fracture or focal lesion. Sinuses/Orbits: Paranasal sinuses and mastoid air cells are clear. The orbits are unremarkable. Other: None. CT CERVICAL SPINE FINDINGS Alignment: Normal. 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 Multilevel degenerative changes of the spine. No acute fracture. No aggressive appearing focal  osseous lesion or focal pathologic process. Soft tissues and spinal canal: No prevertebral fluid or swelling. No visible canal hematoma. Upper chest: Unremarkable. Other: None. IMPRESSION: 1. No acute intracranial abnormality. 2. No acute displaced fracture or traumatic listhesis of the cervical spine. Electronically Signed   By: Tish Frederickson M.D.   On: 06/11/2021 00:58   CT Cervical Spine Wo Contrast  Result Date: 06/11/2021 CLINICAL DATA:  Neck trauma EXAM: CT HEAD WITHOUT CONTRAST CT CERVICAL SPINE WITHOUT CONTRAST TECHNIQUE: Multidetector CT imaging of the head and cervical spine was performed following the standard protocol without intravenous contrast. Multiplanar CT image reconstructions of the cervical spine were also generated. COMPARISON:  CT head 05/14/2008 FINDINGS: CT HEAD FINDINGS Brain: Cerebral ventricle sizes are concordant with the degree of cerebral volume loss. Patchy and confluent areas of decreased attenuation are noted throughout the deep and periventricular white matter of the cerebral hemispheres bilaterally, compatible with chronic microvascular ischemic disease. No evidence of large-territorial acute infarction. No parenchymal hemorrhage. No mass lesion. No extra-axial collection. No mass effect or midline shift. No hydrocephalus. Basilar cisterns are patent. Vascular: No hyperdense  vessel. Skull: No acute fracture or focal lesion. Sinuses/Orbits: Paranasal sinuses and mastoid air cells are clear. The orbits are unremarkable. Other: None. CT CERVICAL SPINE FINDINGS Alignment: Normal. 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 Multilevel degenerative changes of the spine. No acute fracture. No aggressive appearing focal osseous lesion or focal pathologic process. Soft tissues and spinal canal: No prevertebral fluid or swelling. No visible canal hematoma. Upper chest: Unremarkable. Other: None. IMPRESSION: 1. No acute intracranial abnormality. 2. No acute displaced fracture or traumatic listhesis of the cervical spine. Electronically Signed   By: Tish Frederickson M.D.   On: 06/11/2021 00:58    Procedures Procedures   CRITICAL CARE Performed by: Garlon Hatchet   Total critical care time: 40 minutes  Critical care time was exclusive of separately billable procedures and treating other patients.  Critical care was necessary to treat or prevent imminent or life-threatening deterioration.  Critical care was time spent personally by me on the following activities: development of treatment plan with patient and/or surrogate as well as nursing, discussions with consultants, evaluation of patient's response to treatment, examination of patient, obtaining history from patient or surrogate, ordering and performing treatments and interventions, ordering and review of laboratory studies, ordering and review of radiographic studies, pulse oximetry and re-evaluation of patient's condition.   Medications Ordered in ED Medications  potassium chloride 10 mEq in 100 mL IVPB (has no administration in time range)  potassium chloride SA (KLOR-CON) CR tablet 40 mEq (has no administration in time range)  sodium chloride 0.9 % bolus 500 mL (has no administration in time range)    ED Course  I have reviewed the triage vital signs and the nursing  notes.  Pertinent labs & imaging results that were available during my care of the patient were reviewed by me and considered in my medical decision making (see chart for details).    MDM Rules/Calculators/A&P                           75 year old female presenting to the ED with frequent falls.  Has been in bed all weekend after fall on Friday.  Attempted to get up and fell again today.  She does admit to feeling weak and intermittently confused.  She is awake, alert, oriented.  She  has c-collar in place.  She is spontaneously moving her extremities and denies any focal numbness or weakness of them.  Her work-up was initiated in triage including labs which reveal hypokalemia at 2.6 and significant elevation of her LFTs-- 576 and 305 respectively.  She denies any known history of liver disease but does report drinking beer fairly heavily on a daily basis for many years now.   She denies any abdominal pain or vomiting. Given she has been experiencing some intermittent confusion, will add on ammonia, ethanol, magnesium, APAP level, salicylate level.  She is pending CT of head and neck along with chest x-ray.  Will replace K+, given gentle hydration.  CT's reassuring.  C-collar removed.  CXR clear.  IV K+ infusing.  Awaiting remainder of labs.  2:46 AM Remainder of added on labs fairly unrevealing-- normal ammonia, magnesium, negative tylenol, salicylate, and alcohol levels.  Hepatitis panel pending.  Her LFT's are in a 2:1 pattern so I suspect overall this is related to her alcohol use.  Attempted to ambulate patient, she is unable to even sit up at this point and has been in the bed at home for the past 3 days since falling Friday.  At this point, she appears deconditioned.  Will need to continue IV K+, will admit for ongoing care.  Discussed with Dr. Margo Aye-- will admit for ongoing care.  Final Clinical Impression(s) / ED Diagnoses Final diagnoses:  Fall  Hypokalemia  Generalized weakness     Rx / DC Orders ED Discharge Orders     None        Garlon Hatchet, PA-C 06/11/21 [id number]    Gilda Crease, MD 06/11/21 [phone]

## 2021-06-11 NOTE — Progress Notes (Signed)
PROGRESS NOTE  Brief Narrative: Kristy [name] is a 75 y.o. female with a history of alcohol use who presented to the ED for frequent falls, generalized weakness, and confusion. She was found to be afebrile, hypertensive, and without hypoxia. Also had hypokalemia (K 2.6), SCr 1.88 (above baseline ~1.5), LFT elevation (AST 576, ALT 305). Ammonia normal. EtOH, salicylate, tylenol levels negative. CT head and cervical spine were reassuring, though the patient was unable to rise to seated position from laying prompting consultation for admission. The patient was given potassium supplementation and admitted a few hours ago by Dr. Margo Aye.   Subjective: She is awake, has no pain, knows she's in the ED but not completely clear around events leading to this, knows she fell. Doesn't feel shaky, and after I ask what happens when she stops drinking beer, she says "I don't know. I've never tried to stop drinking." No focal weakness or numbness. As I leave at the conclusion of the visit (in the hallway of the ED), she asks me to get her beer that's at the foot of her bed because she doesn't want them to throw it out. She accepts my explanation that there is no beer there.  Objective: BP (!) 187/92 (BP Location: Right Arm)   Pulse 82   Temp 98.8 F (37.1 C)   Resp 19   SpO2 98%   Gen: Nontoxic, older female in no acute distress. Pulm: Clear and nonlabored on room air  CV: RRR, no murmur, no JVD, no edema GI: Soft, NT, ND, +BS  Neuro: Alert and oriented. No focal deficits. Skin: Severe dry skin and hyperkeratosis involving bilateral feet extending w/hyperpigmentation to lower legs without erythema or tenderness.   Assessment & Plan: Fall at home, severe weakness: CT head, cervical spine reassuring at admission.   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  - Hepatitis panel pending.  - No indication for prednisolone at this time.   Alcohol abuse: Unknown quantity of  habit (stated 24oz beer/day long term), or last EtOH but level was negative in ED. No evidence of withdrawal at this time.  - Monitor closely for withdrawal.  - Cessation counseling to be repeated once pt more clear-headed.  - Empirically supplementing thiamine, folate, MVM.   Hypokalemia:  - Supplemented  HTN:  - Given norvasc 5mg , can repeat dose today if needed.  - Holding ARB-HCTZ w/prerenal AKI.  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  - Start IVF (banana bag) and monitor in AM.   HLD:  - Hold statin for now.  , MD Pager on amion 06/11/2021, 8:21 AM

## 2021-06-12 LAB — BASIC METABOLIC PANEL
Anion gap: 12 (ref 5–15)
BUN: 31 mg/dL — ABNORMAL HIGH (ref 8–23)
CO2: 23 mmol/L (ref 22–32)
Calcium: 9.4 mg/dL (ref 8.9–10.3)
Chloride: 102 mmol/L (ref 98–111)
Creatinine, Ser: 1.51 mg/dL — ABNORMAL HIGH (ref 0.44–1.00)
GFR, Estimated: 36 mL/min — ABNORMAL LOW (ref >60)
Glucose, Bld: 110 mg/dL — ABNORMAL HIGH (ref 70–99)
Potassium: 3.7 mmol/L (ref 3.5–5.1)
Sodium: 137 mmol/L (ref 135–145)

## 2021-06-12 LAB — COMPREHENSIVE METABOLIC PANEL WITH GFR
ALT: 199 U/L — ABNORMAL HIGH (ref 0–44)
AST: 138 U/L — ABNORMAL HIGH (ref 15–41)
Albumin: 1.8 g/dL — ABNORMAL LOW (ref 3.5–5.0)
Alkaline Phosphatase: 92 U/L (ref 38–126)
Anion gap: 13 (ref 5–15)
BUN: 31 mg/dL — ABNORMAL HIGH (ref 8–23)
CO2: 25 mmol/L (ref 22–32)
Calcium: 9.2 mg/dL (ref 8.9–10.3)
Chloride: 98 mmol/L (ref 98–111)
Creatinine, Ser: 1.46 mg/dL — ABNORMAL HIGH (ref 0.44–1.00)
GFR, Estimated: 37 mL/min — ABNORMAL LOW
Glucose, Bld: 116 mg/dL — ABNORMAL HIGH (ref 70–99)
Potassium: 2.9 mmol/L — ABNORMAL LOW (ref 3.5–5.1)
Sodium: 136 mmol/L (ref 135–145)
Total Bilirubin: 3.3 mg/dL — ABNORMAL HIGH (ref 0.3–1.2)
Total Protein: 6.4 g/dL — ABNORMAL LOW (ref 6.5–8.1)

## 2021-06-12 LAB — URINALYSIS, MICROSCOPIC (REFLEX)

## 2021-06-12 LAB — RAPID URINE DRUG SCREEN, HOSP PERFORMED
Amphetamines: NOT DETECTED
Barbiturates: NOT DETECTED
Benzodiazepines: NOT DETECTED
Cocaine: NOT DETECTED
Opiates: NOT DETECTED
Tetrahydrocannabinol: NOT DETECTED

## 2021-06-12 LAB — URINALYSIS, ROUTINE W REFLEX MICROSCOPIC
Glucose, UA: NEGATIVE mg/dL
Ketones, ur: NEGATIVE mg/dL
Nitrite: POSITIVE — AB
Protein, ur: 30 mg/dL — AB
Specific Gravity, Urine: 1.015 (ref 1.005–1.030)
pH: 7 (ref 5.0–8.0)

## 2021-06-12 MED ORDER — MAGNESIUM SULFATE 2 GM/50ML IV SOLN
2.0000 g | Freq: Once | INTRAVENOUS | Status: AC
  Administered 2021-06-12: 2 g via INTRAVENOUS
  Filled 2021-06-12: qty 50

## 2021-06-12 MED ORDER — IRBESARTAN 150 MG PO TABS
150.0000 mg | ORAL_TABLET | Freq: Every day | ORAL | Status: DC
  Administered 2021-06-12 – 2021-06-13 (×2): 150 mg via ORAL
  Filled 2021-06-12 (×3): qty 1

## 2021-06-12 MED ORDER — SODIUM CHLORIDE 0.45 % IV SOLN
INTRAVENOUS | Status: DC
  Filled 2021-06-12 (×4): qty 1000

## 2021-06-12 MED ORDER — AMLODIPINE BESYLATE 10 MG PO TABS
10.0000 mg | ORAL_TABLET | Freq: Every day | ORAL | Status: DC
  Administered 2021-06-13: 10 mg via ORAL
  Filled 2021-06-12 (×2): qty 1

## 2021-06-12 MED ORDER — AMLODIPINE BESYLATE 5 MG PO TABS
5.0000 mg | ORAL_TABLET | Freq: Once | ORAL | Status: AC
  Administered 2021-06-12: 5 mg via ORAL
  Filled 2021-06-12: qty 1

## 2021-06-12 MED ORDER — POTASSIUM CHLORIDE CRYS ER 20 MEQ PO TBCR
40.0000 meq | EXTENDED_RELEASE_TABLET | Freq: Once | ORAL | Status: AC
  Administered 2021-06-12: 40 meq via ORAL
  Filled 2021-06-12: qty 2

## 2021-06-12 NOTE — Progress Notes (Addendum)
PROGRESS NOTE  JOETTE SCHMOKER  HBZ:[id number] DOB: 14-Jun-1946 DOA: 06/10/2021 PCP: Darrow Bussing, MD   Brief Narrative: CATELYNN SPARGER is a 75 y.o. female with a history of alcohol use who presented to the ED for frequent falls, generalized weakness, and confusion. She was found to be afebrile, hypertensive, and without hypoxia. Also had hypokalemia (K 2.6), SCr 1.88 (above baseline ~1.5), LFT elevation (AST 576, ALT 305). Ammonia normal. EtOH, salicylate, tylenol levels negative. CT head and cervical spine were reassuring, though the patient was unable to rise to seated position from laying prompting consultation for admission. The patient was given potassium supplementation, started on CIWA protocol, and admitted with note made of continued AMS, hallucinations. Creatinine and LFTs have improved, though potassium is refractory to supplementation. She remains confused.  Assessment & Plan: Active Problems:   Fall   Alcoholic hepatitis  Acute metabolic encephalopathy: Multifactorial, likely also a chronic component.  - CIWA, electrolyte replacement, collect urine (both UA and UDS ordered in ED and pending). - Once reversible causes identified and managed, if remains with abnormal mentation, would benefit from psychiatry evaluation.  Leukocytosis: Afebrile.  - Check UA (has been ordered, not yet collected)  Fall at home, severe weakness: CT head, cervical spine reassuring at admission.   - PT/OT recommending SNF rehabilitation which will be pursued   Alcoholic hepatitis: Based on AST predominance of LFT elevations. Hepatitis panel nonreactive.  - No indication for prednisolone at this time. Continue trending.    Alcohol abuse: Unknown quantity of habit (stated 24oz beer/day long term), or last EtOH but level was negative in ED.  - Continue CIWA. Monitor closely for withdrawal. - Cessation counseling to be repeated once pt more clear-headed. - Empirically supplementing thiamine, folate, MVM.    Hypokalemia: - Supplemented, still severely low, will augment supplement in IVF and po, give magnesium empirically and check later this afternoon to guide management.    HTN: - Remains elevated, will increase norvasc to 10mg .  - Restart ARB (valsartan formulary)   AKI on stage IIIb CKD: Suspected to be prerenal. CK wnl at 138. - SCr improved to putative baseline. To help with BP control, will restart home ARB. Continue holding HCTZ. - Continue IVF as above.   HLD: - Hold statin for now.  DVT prophylaxis: Lovenox 30mg  Code Status: Full Family Communication: Granddaughter at bedside Disposition Plan:  Status is: Inpatient  Remains inpatient appropriate because:Persistent severe electrolyte disturbances, Altered mental status, and Unsafe d/c plan  Dispo: The patient is from: Home              Anticipated d/c is to: SNF              Patient currently is not medically stable to d/c.   Difficult to place patient No  Consultants:  None  Procedures:  None  Antimicrobials: None   Subjective: Pt would not look toward me throughout the interview but did respond to questions. Began talking for several sentences and when I asked her to repeat what she said, she told me "I'm not talking to you no way." Though she could tell me where she lived, with her husband. Granddaughter states there has been concern for dementia PTA.   Objective: Vitals:   06/11/21 1747 06/11/21 2021 06/12/21 0000 06/12/21 0459  BP: (!) 189/93 (!) 171/92 (!) 173/93 (!) 170/84  Pulse: 90 88 92 86  Resp: 17 19 17 18   Temp: 98.9 F (37.2 C) 98.8 F (37.1 C) 98.1 F (36.7  C) 97.6 F (36.4 C)  TempSrc: Oral Oral Oral Axillary  SpO2: 98% 100% 99% 98%    Intake/Output Summary (Last 24 hours) at 06/12/2021 1010 Last data filed at 06/12/2021 0500 Gross per 24 hour  Intake --  Output 450 ml  Net -450 ml   There were no vitals filed for this visit.  Gen: 75 y.o. female in no distress  Pulm: Non-labored  breathing room air. Clear to auscultation bilaterally.  CV: Regular rate and rhythm. No murmur, rub, or gallop. No JVD, no pedal edema. GI: Abdomen soft, non-tender, non-distended, with normoactive bowel sounds. No organomegaly or masses felt. Ext: Warm, no deformities Skin: No rashes, lesions or ulcers. Dry feet stable.  Neuro: Alert and incompletely oriented. No focal neurological deficits. Psych: Judgement and insight appear impaired. Suspicion for hallucinations. Mood & affect appropriate.   Data Reviewed: I have personally reviewed following labs and imaging studies  CBC: Recent Labs  Lab 06/10/21 2105 06/11/21 0611  WBC 10.8* 13.9*  NEUTROABS 8.4*  --   HGB 15.2* 15.8*  HCT 45.3 46.1*  MCV 95.4 94.3  PLT 100* 106*   Basic Metabolic Panel: Recent Labs  Lab 06/10/21 2105 06/11/21 0000 06/11/21 0611 06/12/21 0333  NA 128*  --  132* 136  K 2.6*  --  3.3* 2.9*  CL 91*  --  95* 98  CO2 24  --  25 25  GLUCOSE 106*  --  86 116*  BUN 39*  --  35* 31*  CREATININE 1.88*  --  1.66* 1.46*  CALCIUM 8.8*  --  8.9 9.2  MG  --  1.9  --   --    GFR: CrCl cannot be calculated (Unknown ideal weight.). Liver Function Tests: Recent Labs  Lab 06/10/21 2105 06/11/21 0611 06/12/21 0333  AST 576* 425* 138*  ALT 305* 315* 199*  ALKPHOS 101 117 92  BILITOT 3.0* 3.0* 3.3*  PROT 6.8 6.9 6.4*  ALBUMIN 2.1* 2.0* 1.8*   No results for input(s): LIPASE, AMYLASE in the last 168 hours. Recent Labs  Lab 06/11/21 0000  AMMONIA 25   Coagulation Profile: Recent Labs  Lab 06/10/21 2105  INR 1.2   Cardiac Enzymes: Recent Labs  Lab 06/10/21 2105  CKTOTAL 138   BNP (last 3 results) No results for input(s): PROBNP in the last 8760 hours. HbA1C: No results for input(s): HGBA1C in the last 72 hours. CBG: No results for input(s): GLUCAP in the last 168 hours. Lipid Profile: No results for input(s): CHOL, HDL, LDLCALC, TRIG, CHOLHDL, LDLDIRECT in the last 72 hours. Thyroid  Function Tests: No results for input(s): TSH, T4TOTAL, FREET4, T3FREE, THYROIDAB in the last 72 hours. Anemia Panel: No results for input(s): VITAMINB12, FOLATE, FERRITIN, TIBC, IRON, RETICCTPCT in the last 72 hours. Urine analysis:    Component Value Date/Time   COLORURINE YELLOW 12/29/2015 1740   APPEARANCEUR CLEAR 12/29/2015 1740   LABSPEC 1.007 12/29/2015 1740   PHURINE 5.0 12/29/2015 1740   GLUCOSEU NEGATIVE 12/29/2015 1740   HGBUR NEGATIVE 12/29/2015 1740   BILIRUBINUR NEGATIVE 12/29/2015 1740   KETONESUR NEGATIVE 12/29/2015 1740   PROTEINUR NEGATIVE 12/29/2015 1740   UROBILINOGEN 0.2 05/14/2008 0020   NITRITE NEGATIVE 12/29/2015 1740   LEUKOCYTESUR NEGATIVE 12/29/2015 1740   Recent Results (from the past 240 hour(s))  Resp Panel by RT-PCR (Flu A&B, Covid) Nasopharyngeal Swab     Status: None   Collection Time: 06/11/21  3:13 AM   Specimen: Nasopharyngeal Swab; Nasopharyngeal(NP) swabs in vial transport  medium  Result Value Ref Range Status   SARS Coronavirus 2 by RT PCR NEGATIVE NEGATIVE Final    Comment: (NOTE) SARS-CoV-2 target nucleic acids are NOT DETECTED.  The SARS-CoV-2 RNA is generally detectable in upper respiratory specimens during the acute phase of infection. The lowest concentration of SARS-CoV-2 viral copies this assay can detect is 138 copies/mL. A negative result does not preclude SARS-Cov-2 infection and should not be used as the sole basis for treatment or other patient management decisions. A negative result may occur with  improper specimen collection/handling, submission of specimen other than nasopharyngeal swab, presence of viral mutation(s) within the areas targeted by this assay, and inadequate number of viral copies(<138 copies/mL). A negative result must be combined with clinical observations, patient history, and epidemiological information. The expected result is Negative.  Fact Sheet for Patients:   BloggerCourse.comhttps://www.fda.gov/media/152166/download  Fact Sheet for Healthcare Providers:  SeriousBroker.ithttps://www.fda.gov/media/152162/download  This test is no t yet approved or cleared by the [place] FDA and  has been authorized for detection and/or diagnosis of SARS-CoV-2 by FDA under an Emergency Use Authorization (EUA). This EUA will remain  in effect (meaning this test can be used) for the duration of the COVID-19 declaration under Section 564(b)(1) of the Act, 21 U.S.C.section 360bbb-3(b)(1), unless the authorization is terminated  or revoked sooner.       Influenza A by PCR NEGATIVE NEGATIVE Final   Influenza B by PCR NEGATIVE NEGATIVE Final    Comment: (NOTE) The Xpert Xpress SARS-CoV-2/FLU/RSV plus assay is intended as an aid in the diagnosis of influenza from Nasopharyngeal swab specimens and should not be used as a sole basis for treatment. Nasal washings and aspirates are unacceptable for Xpert Xpress SARS-CoV-2/FLU/RSV testing.  Fact Sheet for Patients: BloggerCourse.comhttps://www.fda.gov/media/152166/download  Fact Sheet for Healthcare Providers: SeriousBroker.ithttps://www.fda.gov/media/152162/download  This test is not yet approved or cleared by the [place] FDA and has been authorized for detection and/or diagnosis of SARS-CoV-2 by FDA under an Emergency Use Authorization (EUA). This EUA will remain in effect (meaning this test can be used) for the duration of the COVID-19 declaration under Section 564(b)(1) of the Act, 21 U.S.C. section 360bbb-3(b)(1), unless the authorization is terminated or revoked.  Performed at [facility], [address] [place], [place] [id number]       Radiology Studies: DG Chest 1 View  Result Date: 06/11/2021 CLINICAL DATA:  frequent falls, weakness EXAM: CHEST  1 VIEW COMPARISON:  None. FINDINGS: Relative opacification of the left hemithorax is likely artifactual and related to rotation and overlying soft tissue on this examination. The lungs are clear. No  pneumothorax or pleural effusion. Cardiac size is within normal limits. No acute bone abnormality. IMPRESSION: No active disease. Electronically Signed   By: Helyn NumbersAshesh  Parikh MD   On: 06/11/2021 00:18   CT Head Wo Contrast  Result Date: 06/11/2021 CLINICAL DATA:  Neck trauma EXAM: CT HEAD WITHOUT CONTRAST CT CERVICAL SPINE WITHOUT CONTRAST TECHNIQUE: Multidetector CT imaging of the head and cervical spine was performed following the standard protocol without intravenous contrast. Multiplanar CT image reconstructions of the cervical spine were also generated. COMPARISON:  CT head 05/14/2008 FINDINGS: CT HEAD FINDINGS Brain: Cerebral ventricle sizes are concordant with the degree of cerebral volume loss. Patchy and confluent areas of decreased attenuation are noted throughout the deep and periventricular white matter of the cerebral hemispheres bilaterally, compatible with chronic microvascular ischemic disease. No evidence of large-territorial acute infarction. No parenchymal hemorrhage. No mass lesion. No extra-axial collection. No  mass effect or midline shift. No hydrocephalus. Basilar cisterns are patent. Vascular: No hyperdense vessel. Skull: No acute fracture or focal lesion. Sinuses/Orbits: Paranasal sinuses and mastoid air cells are clear. The orbits are unremarkable. Other: None. CT CERVICAL SPINE FINDINGS Alignment: Normal. 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 Multilevel degenerative changes of the spine. No acute fracture. No aggressive appearing focal osseous lesion or focal pathologic process. Soft tissues and spinal canal: No prevertebral fluid or swelling. No visible canal hematoma. Upper chest: Unremarkable. Other: None. IMPRESSION: 1. No acute intracranial abnormality. 2. No acute displaced fracture or traumatic listhesis of the cervical spine. Electronically Signed   By: Tish Frederickson M.D.   On: 06/11/2021 00:58   CT Cervical Spine Wo  Contrast  Result Date: 06/11/2021 CLINICAL DATA:  Neck trauma EXAM: CT HEAD WITHOUT CONTRAST CT CERVICAL SPINE WITHOUT CONTRAST TECHNIQUE: Multidetector CT imaging of the head and cervical spine was performed following the standard protocol without intravenous contrast. Multiplanar CT image reconstructions of the cervical spine were also generated. COMPARISON:  CT head 05/14/2008 FINDINGS: CT HEAD FINDINGS Brain: Cerebral ventricle sizes are concordant with the degree of cerebral volume loss. Patchy and confluent areas of decreased attenuation are noted throughout the deep and periventricular white matter of the cerebral hemispheres bilaterally, compatible with chronic microvascular ischemic disease. No evidence of large-territorial acute infarction. No parenchymal hemorrhage. No mass lesion. No extra-axial collection. No mass effect or midline shift. No hydrocephalus. Basilar cisterns are patent. Vascular: No hyperdense vessel. Skull: No acute fracture or focal lesion. Sinuses/Orbits: Paranasal sinuses and mastoid air cells are clear. The orbits are unremarkable. Other: None. CT CERVICAL SPINE FINDINGS Alignment: Normal. 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 Multilevel degenerative changes of the spine. No acute fracture. No aggressive appearing focal osseous lesion or focal pathologic process. Soft tissues and spinal canal: No prevertebral fluid or swelling. No visible canal hematoma. Upper chest: Unremarkable. Other: None. IMPRESSION: 1. No acute intracranial abnormality. 2. No acute displaced fracture or traumatic listhesis of the cervical spine. Electronically Signed   By: Tish Frederickson M.D.   On: 06/11/2021 00:58    Scheduled Meds:  allopurinol  300 mg Oral Daily   amLODipine  5 mg Oral Daily   aspirin  81 mg Oral Daily   enoxaparin (LOVENOX) injection  30 mg Subcutaneous Daily   folic acid  1 mg Oral Daily   LORazepam  0-4 mg Oral Q6H   Followed by    Melene Muller ON 06/13/2021] LORazepam  0-4 mg Oral Q12H   multivitamin with minerals  1 tablet Oral Daily   thiamine  100 mg Oral Daily   Or   thiamine  100 mg Intravenous Daily   Continuous Infusions:  sodium chloride 0.45 % with kcl 100 mL/hr at 06/12/21 0930     LOS: 1 day   Time spent: 35 minutes.  Tyrone Nine, MD [facility] [email or web address] 06/12/2021, 10:10 AM

## 2021-06-13 LAB — BASIC METABOLIC PANEL
Anion gap: 14 (ref 5–15)
BUN: 40 mg/dL — ABNORMAL HIGH (ref 8–23)
CO2: 22 mmol/L (ref 22–32)
Calcium: 9.8 mg/dL (ref 8.9–10.3)
Chloride: 100 mmol/L (ref 98–111)
Creatinine, Ser: 1.62 mg/dL — ABNORMAL HIGH (ref 0.44–1.00)
GFR, Estimated: 33 mL/min — ABNORMAL LOW (ref >60)
Glucose, Bld: 84 mg/dL (ref 70–99)
Potassium: 4.3 mmol/L (ref 3.5–5.1)
Sodium: 136 mmol/L (ref 135–145)

## 2021-06-13 LAB — MAGNESIUM: Magnesium: 2.1 mg/dL (ref 1.7–2.4)

## 2021-06-13 NOTE — [place] FL2 (Signed)
[place] MEDICAID FL2 LEVEL OF CARE SCREENING TOOL     IDENTIFICATION  Patient Name: Kristy [name] Birthdate: 1946/10/30 Sex: female Admission Date (Current Location): 06/10/2021  [facility] and [place] Number:  Producer, television/film/video and Address:  [facility] [place], [address], [place], [place] [address]      Provider Number: [id number]  Attending Physician Name and Address:  Tyrone Nine, MD  Relative Name and Phone Number:       Current Level of Care: Hospital Recommended Level of Care: Skilled Nursing Facility Prior Approval Number:    Date Approved/Denied:   PASRR Number: [id number]  Discharge Plan: SNF    Current Diagnoses: Patient Active Problem List   Diagnosis Date Noted   Fall 06/11/2021   Alcoholic hepatitis 06/11/2021    Orientation RESPIRATION BLADDER Height & Weight     Self  Normal Incontinent Weight:   Height:     BEHAVIORAL SYMPTOMS/MOOD NEUROLOGICAL BOWEL NUTRITION STATUS      Incontinent Diet  AMBULATORY STATUS COMMUNICATION OF NEEDS Skin   Extensive Assist Verbally Normal                       Personal Care Assistance Level of Assistance  Bathing, Feeding, Dressing Bathing Assistance: Maximum assistance Feeding assistance: Limited assistance Dressing Assistance: Maximum assistance     Functional Limitations Info  Speech, Hearing, Sight Sight Info: Adequate Hearing Info: Adequate Speech Info: Adequate    SPECIAL CARE FACTORS FREQUENCY  PT (By licensed PT), OT (By licensed OT)     PT Frequency: 5x a week OT Frequency: 5x a week            Contractures Contractures Info: Not present    Additional Factors Info  Code Status, Allergies Code Status Info: Full Allergies Info: NKA           Current Medications (06/13/2021):  This is the current hospital active medication list Current Facility-Administered Medications  Medication Dose Route Frequency Provider Last Rate Last Admin   acetaminophen  (TYLENOL) tablet 650 mg  650 mg Oral Q6H PRN Dow Adolph N, DO       allopurinol (ZYLOPRIM) tablet 300 mg  300 mg Oral Daily Dow Adolph N, DO   300 mg at 06/13/21 0950   amLODipine (NORVASC) tablet 10 mg  10 mg Oral Daily Tyrone Nine, MD   10 mg at 06/13/21 0950   aspirin chewable tablet 81 mg  81 mg Oral Daily Darlin Drop, DO   81 mg at 06/13/21 0950   enoxaparin (LOVENOX) injection 30 mg  30 mg Subcutaneous Daily Dow Adolph N, DO   30 mg at 06/13/21 1017   folic acid (FOLVITE) tablet 1 mg  1 mg Oral Daily Hazeline Junker B, MD   1 mg at 06/13/21 0950   irbesartan (AVAPRO) tablet 150 mg  150 mg Oral Daily Hazeline Junker B, MD   150 mg at 06/13/21 0950   LORazepam (ATIVAN) tablet 1-4 mg  1-4 mg Oral Q1H PRN Tyrone Nine, MD       Or   LORazepam (ATIVAN) injection 1-4 mg  1-4 mg Intravenous Q1H PRN Tyrone Nine, MD       LORazepam (ATIVAN) tablet 0-4 mg  0-4 mg Oral Q12H Tyrone Nine, MD       melatonin tablet 3 mg  3 mg Oral QHS PRN Dow Adolph N, DO       multivitamin  with minerals tablet 1 tablet  1 tablet Oral Daily Tyrone Nine, MD   1 tablet at 06/13/21 0950   ondansetron (ZOFRAN) injection 4 mg  4 mg Intravenous Q6H PRN Dow Adolph N, DO       polyethylene glycol (MIRALAX / GLYCOLAX) packet 17 g  17 g Oral Daily PRN Hall, Carole N, DO       thiamine tablet 100 mg  100 mg Oral Daily Tyrone Nine, MD   100 mg at 06/13/21 6948   Or   thiamine (B-1) injection 100 mg  100 mg Intravenous Daily Tyrone Nine, MD         Discharge Medications: Please see discharge summary for a list of discharge medications.  Relevant Imaging Results:  Relevant Lab Results:   Additional Information SSN: [id number]  Jimmy Picket, LCSW

## 2021-06-13 NOTE — Progress Notes (Signed)
PROGRESS NOTE  Kristy [name]  BTD:[id number] DOB: 03/10/1946 DOA: 06/10/2021 PCP: Darrow Bussing, MD   Brief Narrative: Kristy [name] is a 75 y.o. female with a history of alcohol use who presented to the ED for frequent falls, generalized weakness, and confusion. She was found to be afebrile, hypertensive, and without hypoxia. Also had hypokalemia (K 2.6), SCr 1.88 (above baseline ~1.5), LFT elevation (AST 576, ALT 305). Ammonia normal. EtOH, salicylate, tylenol levels negative. CT head and cervical spine were reassuring, though the patient was unable to rise to seated position from laying prompting consultation for admission. The patient was given potassium supplementation, started on CIWA protocol, and admitted with note made of continued AMS, hallucinations. Creatinine and LFTs have improved, though potassium is refractory to supplementation. She remains confused.  Assessment & Plan: Active Problems:   Fall   Alcoholic hepatitis  Acute metabolic encephalopathy: Multifactorial, likely also a chronic component.  - CIWA scores are low, metabolic derangements resolved, still with altered mentation. No pyuria on UA.  - Check MR brain, particularly since I'm unable to perform a neurological exam. - Given persistent hallucinations, will ask for psychiatry evaluation.   Leukocytosis: Afebrile.  - Check UA (has been ordered, not yet collected)  Fall at home, severe weakness: CT head, cervical spine reassuring at admission.   - PT/OT recommending SNF rehabilitation which will be pursued   Alcoholic hepatitis: Based on AST predominance of LFT elevations. Hepatitis panel nonreactive.  - No indication for prednisolone at this time. Continue trending.    Alcohol abuse: Unknown quantity of habit (stated 24oz beer/day long term), or last EtOH but level was negative in ED.  - Continue CIWA. Monitor closely for withdrawal. May DC in AM if no need for ativan. - Cessation counseling to be repeated once pt  more clear-headed. - Empirically supplementing thiamine, folate, MVM.   Hypokalemia: - Supplemented with resolution   HTN: - Continue ARB, and increased norvasc.    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 CK wnl at 138. - SCr improved to putative baseline.   HLD: - Hold statin for now.  DVT prophylaxis: Lovenox 30mg  Code Status: Full Family Communication: Granddaughter at bedside Disposition Plan:  Status is: Inpatient  Remains inpatient appropriate because:Persistent severe electrolyte disturbances, Altered mental status, and Unsafe d/c plan  Dispo: The patient is from: Home              Anticipated d/c is to: SNF              Patient currently is not medically stable to d/c.   Difficult to place patient No  Consultants:  Psychiatry  Procedures:  None  Antimicrobials: None   Subjective: Pt states she thinks she's at home, that there are things in the room that are not. States there's pain in her arm but doesn't show me where, won't turn head away from the right side.   Objective: Vitals:   06/12/21 1223 06/12/21 1833 06/12/21 2348 06/13/21 1207  BP: (!) 183/104 (!) 146/87 (!) 151/85 (!) 143/75  Pulse: (!) 103 99 88 94  Resp: 16 16 18 19   Temp: 98.2 F (36.8 C) 97.8 F (36.6 C) 99.2 F (37.3 C) 97.6 F (36.4 C)  TempSrc: Oral  Oral Oral  SpO2: 99% 99% 99% 96%    Intake/Output Summary (Last 24 hours) at 06/13/2021 1313 Last data filed at 06/12/2021 1805 Gross per 24 hour  Intake 834.65 ml  Output --  Net 834.65 ml  Gen: 75 y.o. female in no distress Pulm: Nonlabored breathing room air. Clear. CV: Regular rate and rhythm. No murmur, rub, or gallop. No JVD, trace dependent edema. GI: Abdomen soft, non-tender, non-distended, with normoactive bowel sounds.  Ext: Warm, no deformities. Arms do not have visible or palpable deformities. Skin: No new rashes, lesions or ulcers on visualized skin. Neuro: Alert, disoriented entirely. Not cooperative with  exam, does not follow any commands.  Psych: Judgement and insight appear impaired.   Data Reviewed: I have personally reviewed following labs and imaging studies  CBC: Recent Labs  Lab 06/10/21 2105 06/11/21 0611  WBC 10.8* 13.9*  NEUTROABS 8.4*  --   HGB 15.2* 15.8*  HCT 45.3 46.1*  MCV 95.4 94.3  PLT 100* 106*   Basic Metabolic Panel: Recent Labs  Lab 06/10/21 2105 06/11/21 0000 06/11/21 0611 06/12/21 0333 06/12/21 1558 06/13/21 0306  NA 128*  --  132* 136 137 136  K 2.6*  --  3.3* 2.9* 3.7 4.3  CL 91*  --  95* 98 102 100  CO2 24  --  25 25 23 22   GLUCOSE 106*  --  86 116* 110* 84  BUN 39*  --  35* 31* 31* 40*  CREATININE 1.88*  --  1.66* 1.46* 1.51* 1.62*  CALCIUM 8.8*  --  8.9 9.2 9.4 9.8  MG  --  1.9  --   --   --  2.1   GFR: CrCl cannot be calculated (Unknown ideal weight.). Liver Function Tests: Recent Labs  Lab 06/10/21 2105 06/11/21 0611 06/12/21 0333  AST 576* 425* 138*  ALT 305* 315* 199*  ALKPHOS 101 117 92  BILITOT 3.0* 3.0* 3.3*  PROT 6.8 6.9 6.4*  ALBUMIN 2.1* 2.0* 1.8*   No results for input(s): LIPASE, AMYLASE in the last 168 hours. Recent Labs  Lab 06/11/21 0000  AMMONIA 25   Coagulation Profile: Recent Labs  Lab 06/10/21 2105  INR 1.2   Cardiac Enzymes: Recent Labs  Lab 06/10/21 2105  CKTOTAL 138   BNP (last 3 results) No results for input(s): PROBNP in the last 8760 hours. HbA1C: No results for input(s): HGBA1C in the last 72 hours. CBG: No results for input(s): GLUCAP in the last 168 hours. Lipid Profile: No results for input(s): CHOL, HDL, LDLCALC, TRIG, CHOLHDL, LDLDIRECT in the last 72 hours. Thyroid Function Tests: No results for input(s): TSH, T4TOTAL, FREET4, T3FREE, THYROIDAB in the last 72 hours. Anemia Panel: No results for input(s): VITAMINB12, FOLATE, FERRITIN, TIBC, IRON, RETICCTPCT in the last 72 hours. Urine analysis:    Component Value Date/Time   COLORURINE YELLOW 06/12/2021 1625   APPEARANCEUR  CLEAR 06/12/2021 1625   LABSPEC 1.015 06/12/2021 1625   PHURINE 7.0 06/12/2021 1625   GLUCOSEU NEGATIVE 06/12/2021 1625   HGBUR LARGE (A) 06/12/2021 1625   BILIRUBINUR SMALL (A) 06/12/2021 1625   KETONESUR NEGATIVE 06/12/2021 1625   PROTEINUR 30 (A) 06/12/2021 1625   UROBILINOGEN 0.2 05/14/2008 0020   NITRITE POSITIVE (A) 06/12/2021 1625   LEUKOCYTESUR TRACE (A) 06/12/2021 1625   Recent Results (from the past 240 hour(s))  Resp Panel by RT-PCR (Flu A&B, Covid) Nasopharyngeal Swab     Status: None   Collection Time: 06/11/21  3:13 AM   Specimen: Nasopharyngeal Swab; Nasopharyngeal(NP) swabs in vial transport medium  Result Value Ref Range Status   SARS Coronavirus 2 by RT PCR NEGATIVE NEGATIVE Final    Comment: (NOTE) SARS-CoV-2 target nucleic acids are NOT DETECTED.  The SARS-CoV-2 RNA  is generally detectable in upper respiratory specimens during the acute phase of infection. The lowest concentration of SARS-CoV-2 viral copies this assay can detect is 138 copies/mL. A negative result does not preclude SARS-Cov-2 infection and should not be used as the sole basis for treatment or other patient management decisions. A negative result may occur with  improper specimen collection/handling, submission of specimen other than nasopharyngeal swab, presence of viral mutation(s) within the areas targeted by this assay, and inadequate number of viral copies(<138 copies/mL). A negative result must be combined with clinical observations, patient history, and epidemiological information. The expected result is Negative.  Fact Sheet for Patients:  BloggerCourse.com  Fact Sheet for Healthcare Providers:  SeriousBroker.it  This test is no t yet approved or cleared by the [place] FDA and  has been authorized for detection and/or diagnosis of SARS-CoV-2 by FDA under an Emergency Use Authorization (EUA). This EUA will remain  in effect  (meaning this test can be used) for the duration of the COVID-19 declaration under Section 564(b)(1) of the Act, 21 U.S.C.section 360bbb-3(b)(1), unless the authorization is terminated  or revoked sooner.       Influenza A by PCR NEGATIVE NEGATIVE Final   Influenza B by PCR NEGATIVE NEGATIVE Final    Comment: (NOTE) The Xpert Xpress SARS-CoV-2/FLU/RSV plus assay is intended as an aid in the diagnosis of influenza from Nasopharyngeal swab specimens and should not be used as a sole basis for treatment. Nasal washings and aspirates are unacceptable for Xpert Xpress SARS-CoV-2/FLU/RSV testing.  Fact Sheet for Patients: BloggerCourse.com  Fact Sheet for Healthcare Providers: SeriousBroker.it  This test is not yet approved or cleared by the [place] FDA and has been authorized for detection and/or diagnosis of SARS-CoV-2 by FDA under an Emergency Use Authorization (EUA). This EUA will remain in effect (meaning this test can be used) for the duration of the COVID-19 declaration under Section 564(b)(1) of the Act, 21 U.S.C. section 360bbb-3(b)(1), unless the authorization is terminated or revoked.  Performed at [facility], [address], [place], [place] [address]       Radiology Studies: No results found.  Scheduled Meds:  allopurinol  300 mg Oral Daily   amLODipine  10 mg Oral Daily   aspirin  81 mg Oral Daily   enoxaparin (LOVENOX) injection  30 mg Subcutaneous Daily   folic acid  1 mg Oral Daily   irbesartan  150 mg Oral Daily   LORazepam  0-4 mg Oral Q6H   Followed by   LORazepam  0-4 mg Oral Q12H   multivitamin with minerals  1 tablet Oral Daily   thiamine  100 mg Oral Daily   Or   thiamine  100 mg Intravenous Daily   Continuous Infusions:  sodium chloride 0.45 % with kcl 100 mL/hr at 06/12/21 2009     LOS: 2 days   Time spent: 35 minutes.  Tyrone Nine, MD [facility] [email or web address] 06/13/2021, 1:13 PM

## 2021-06-14 ENCOUNTER — Inpatient Hospital Stay (HOSPITAL_COMMUNITY): Payer: Medicare Other

## 2021-06-14 DIAGNOSIS — R4182 Altered mental status, unspecified: Secondary | ICD-10-CM

## 2021-06-14 DIAGNOSIS — G9341 Metabolic encephalopathy: Secondary | ICD-10-CM

## 2021-06-14 DIAGNOSIS — R410 Disorientation, unspecified: Secondary | ICD-10-CM | POA: Diagnosis present

## 2021-06-14 DIAGNOSIS — A419 Sepsis, unspecified organism: Secondary | ICD-10-CM

## 2021-06-14 LAB — URINALYSIS, ROUTINE W REFLEX MICROSCOPIC
Bilirubin Urine: NEGATIVE
Glucose, UA: NEGATIVE mg/dL
Ketones, ur: NEGATIVE mg/dL
Leukocytes,Ua: NEGATIVE
Nitrite: NEGATIVE
Protein, ur: NEGATIVE mg/dL
Specific Gravity, Urine: 1.011 (ref 1.005–1.030)
pH: 6 (ref 5.0–8.0)

## 2021-06-14 LAB — CBC
HCT: 40.7 % (ref 36.0–46.0)
Hemoglobin: 13.7 g/dL (ref 12.0–15.0)
MCH: 32.3 pg (ref 26.0–34.0)
MCHC: 33.7 g/dL (ref 30.0–36.0)
MCV: 96 fL (ref 80.0–100.0)
Platelets: 205 10*3/uL (ref 150–400)
RBC: 4.24 MIL/uL (ref 3.87–5.11)
RDW: 13.5 % (ref 11.5–15.5)
WBC: 25.3 10*3/uL — ABNORMAL HIGH (ref 4.0–10.5)
nRBC: 0 % (ref 0.0–0.2)

## 2021-06-14 LAB — HEPATIC FUNCTION PANEL
ALT: 136 U/L — ABNORMAL HIGH (ref 0–44)
AST: 120 U/L — ABNORMAL HIGH (ref 15–41)
Albumin: 1.6 g/dL — ABNORMAL LOW (ref 3.5–5.0)
Alkaline Phosphatase: 104 U/L (ref 38–126)
Bilirubin, Direct: 3.3 mg/dL — ABNORMAL HIGH (ref 0.0–0.2)
Indirect Bilirubin: 3.2 mg/dL — ABNORMAL HIGH (ref 0.3–0.9)
Total Bilirubin: 6.5 mg/dL — ABNORMAL HIGH (ref 0.3–1.2)
Total Protein: 6.9 g/dL (ref 6.5–8.1)

## 2021-06-14 LAB — C-REACTIVE PROTEIN: CRP: 38 mg/dL — ABNORMAL HIGH

## 2021-06-14 LAB — VITAMIN B12: Vitamin B-12: 674 pg/mL (ref 180–914)

## 2021-06-14 LAB — TSH: TSH: 3.885 u[IU]/mL (ref 0.350–4.500)

## 2021-06-14 LAB — FOLATE: Folate: 16 ng/mL (ref >5.9)

## 2021-06-14 LAB — RPR: RPR Ser Ql: NONREACTIVE

## 2021-06-14 IMAGING — CT CT HEAD W/O CM
3 series · 16 of 37 positions shown, 18 images · non-contrast
Comparison: Head CT [DATE]

CLINICAL DATA: Mental status changes.

EXAM:
CT HEAD WITHOUT CONTRAST
TECHNIQUE: Contiguous axial images were obtained from the base of the skull
through the vertex without intravenous contrast.

[Series 3: head wo · axial · 0.39mm/px · z∈[+1120,+1250]mm · 7 of 36 slices shown, 9 images]
[im 5/36  brain]
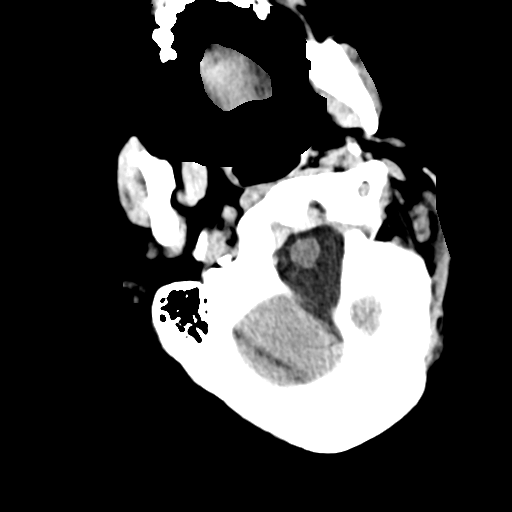
[im 5/36  bone]
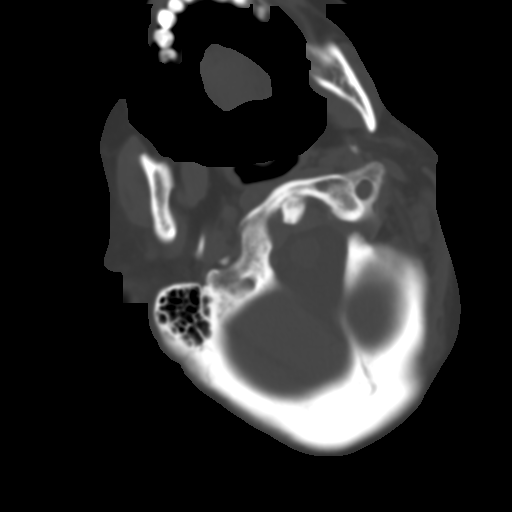
[im 9/36  brain]
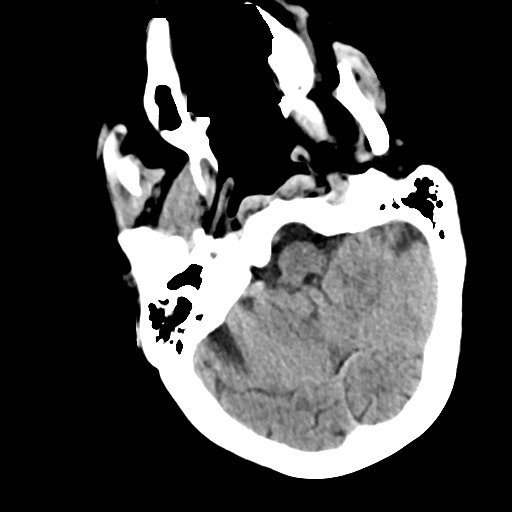
[im 14/36  brain]
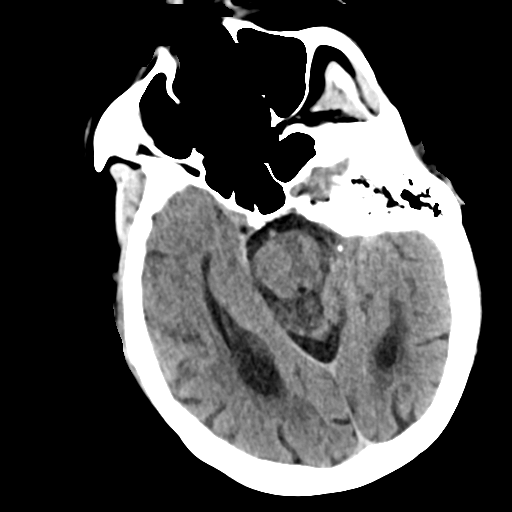
[im 18/36  brain]
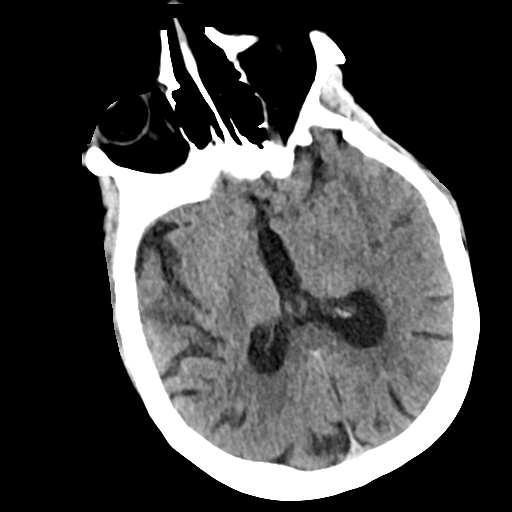
[im 22/36  brain]
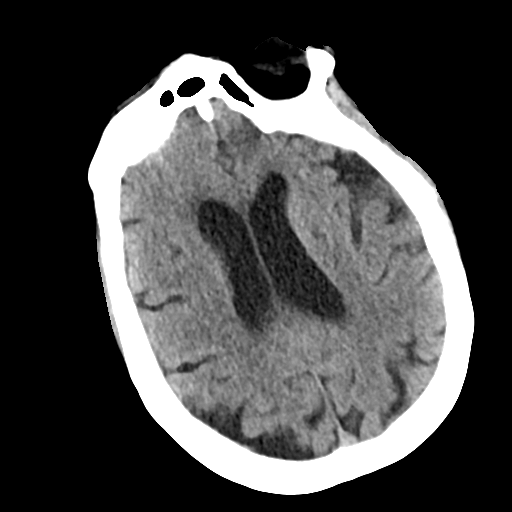
[im 22/36  bone]
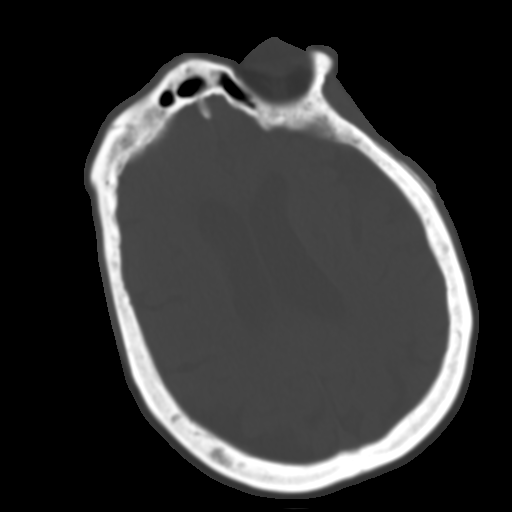
[im 27/36  brain]
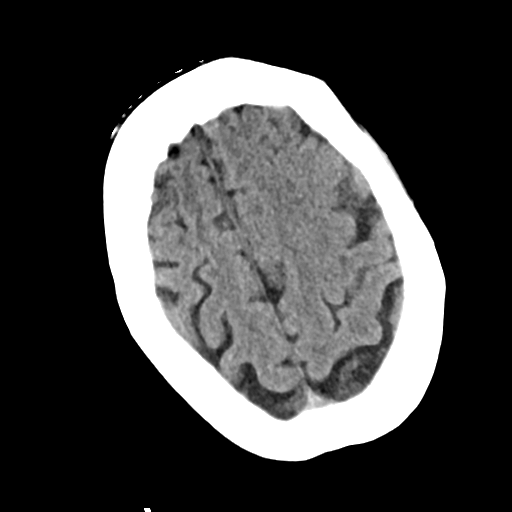
[im 31/36  brain]
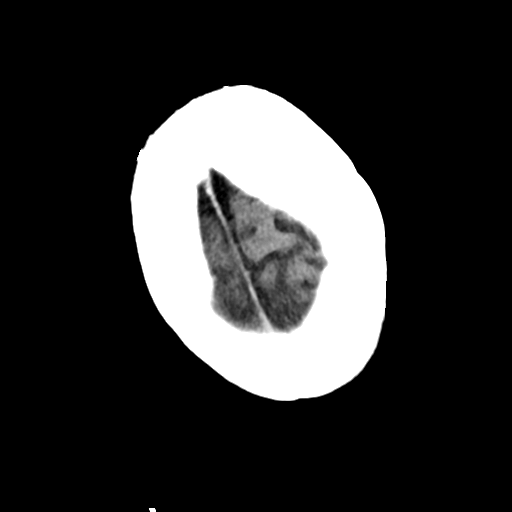

[Series 4: head bone · axial · 0.39mm/px · z∈[+1116,+1224]mm · 6 of 90 slices shown]
[im 9/90  bone]
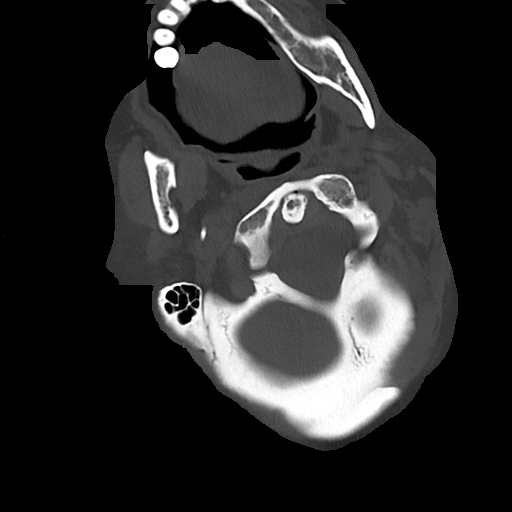
[im 18/90  bone]
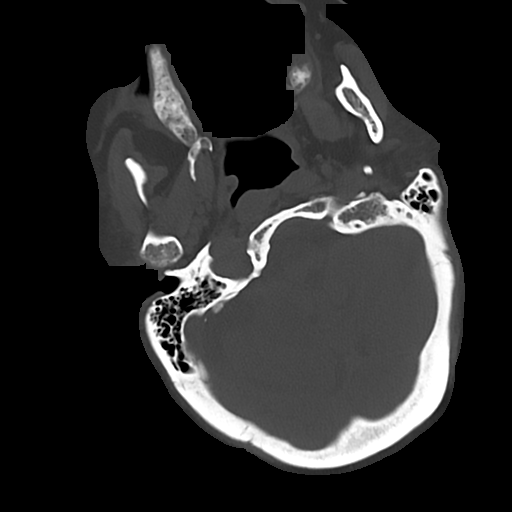
[im 27/90  bone]
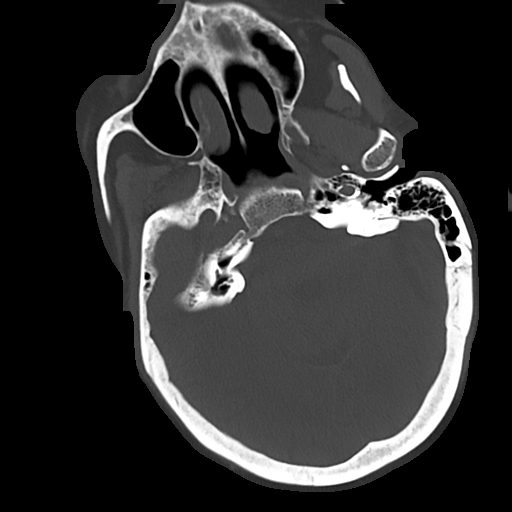
[im 41/90  bone]
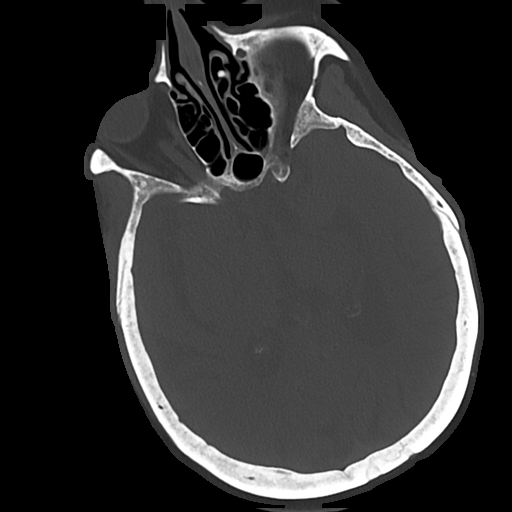
[im 49/90  bone]
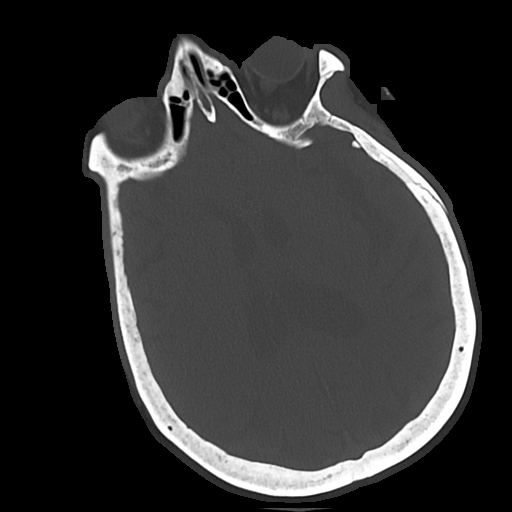
[im 63/90  bone]
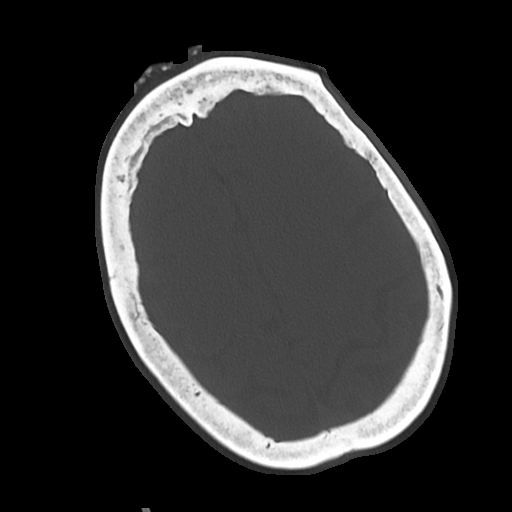

[Series 6: sag soft · sagittal · 0.32mm/px · 3 of 60 slices shown]
[im 24/60  brain]
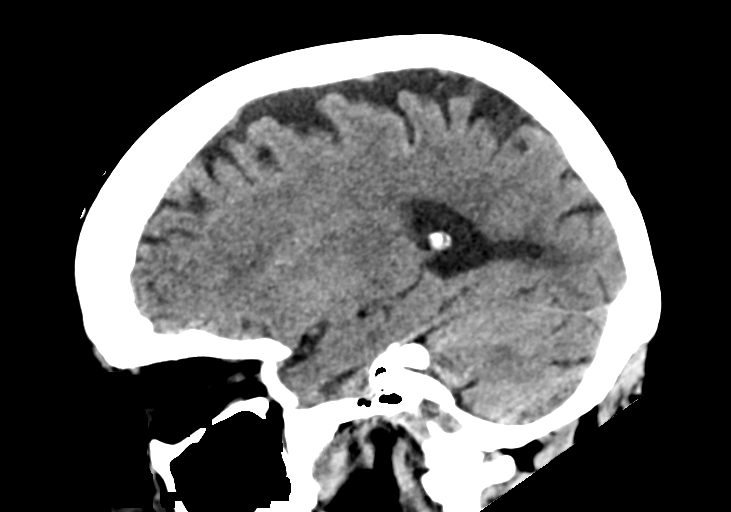
[im 30/60  brain]
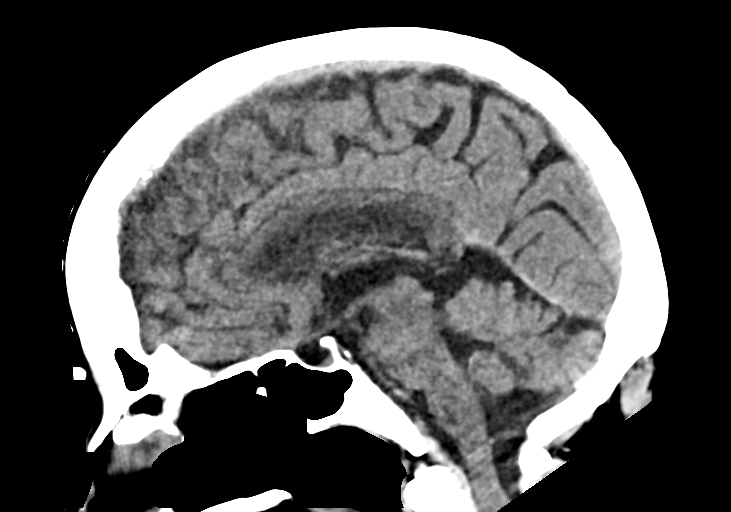
[im 36/60  brain]
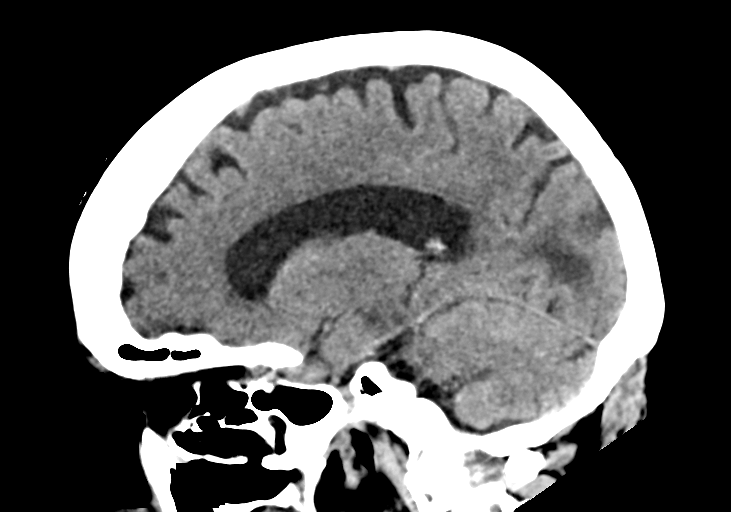

[16 of 37 positions shown; findings below may reference images not displayed]

FINDINGS: Brain: Stable age related cerebral atrophy, ventriculomegaly and
periventricular white matter disease. No extra-axial fluid
collections are identified. No CT findings for acute hemispheric
infarction or intracranial hemorrhage. No mass lesions. The
brainstem and cerebellum are normal.

Vascular: Stable vascular calcifications.  No hyperdense vessels.

Skull: No skull fracture or bone lesions.

Sinuses/Orbits: The paranasal sinuses and mastoid air cells are
clear. The globes are intact.

Other: No scalp lesions or scalp hematoma.
IMPRESSION: 1. Stable age related cerebral atrophy, ventriculomegaly and
periventricular white matter disease.
2. No acute intracranial findings or mass lesions.

## 2021-06-14 IMAGING — DX DG CHEST 1V PORT
1 series · 1 of 1 positions shown · non-contrast
Comparison: [DATE], [DATE]

CLINICAL DATA: Leukocytosis

EXAM:
PORTABLE CHEST 1 VIEW

[chest ap]
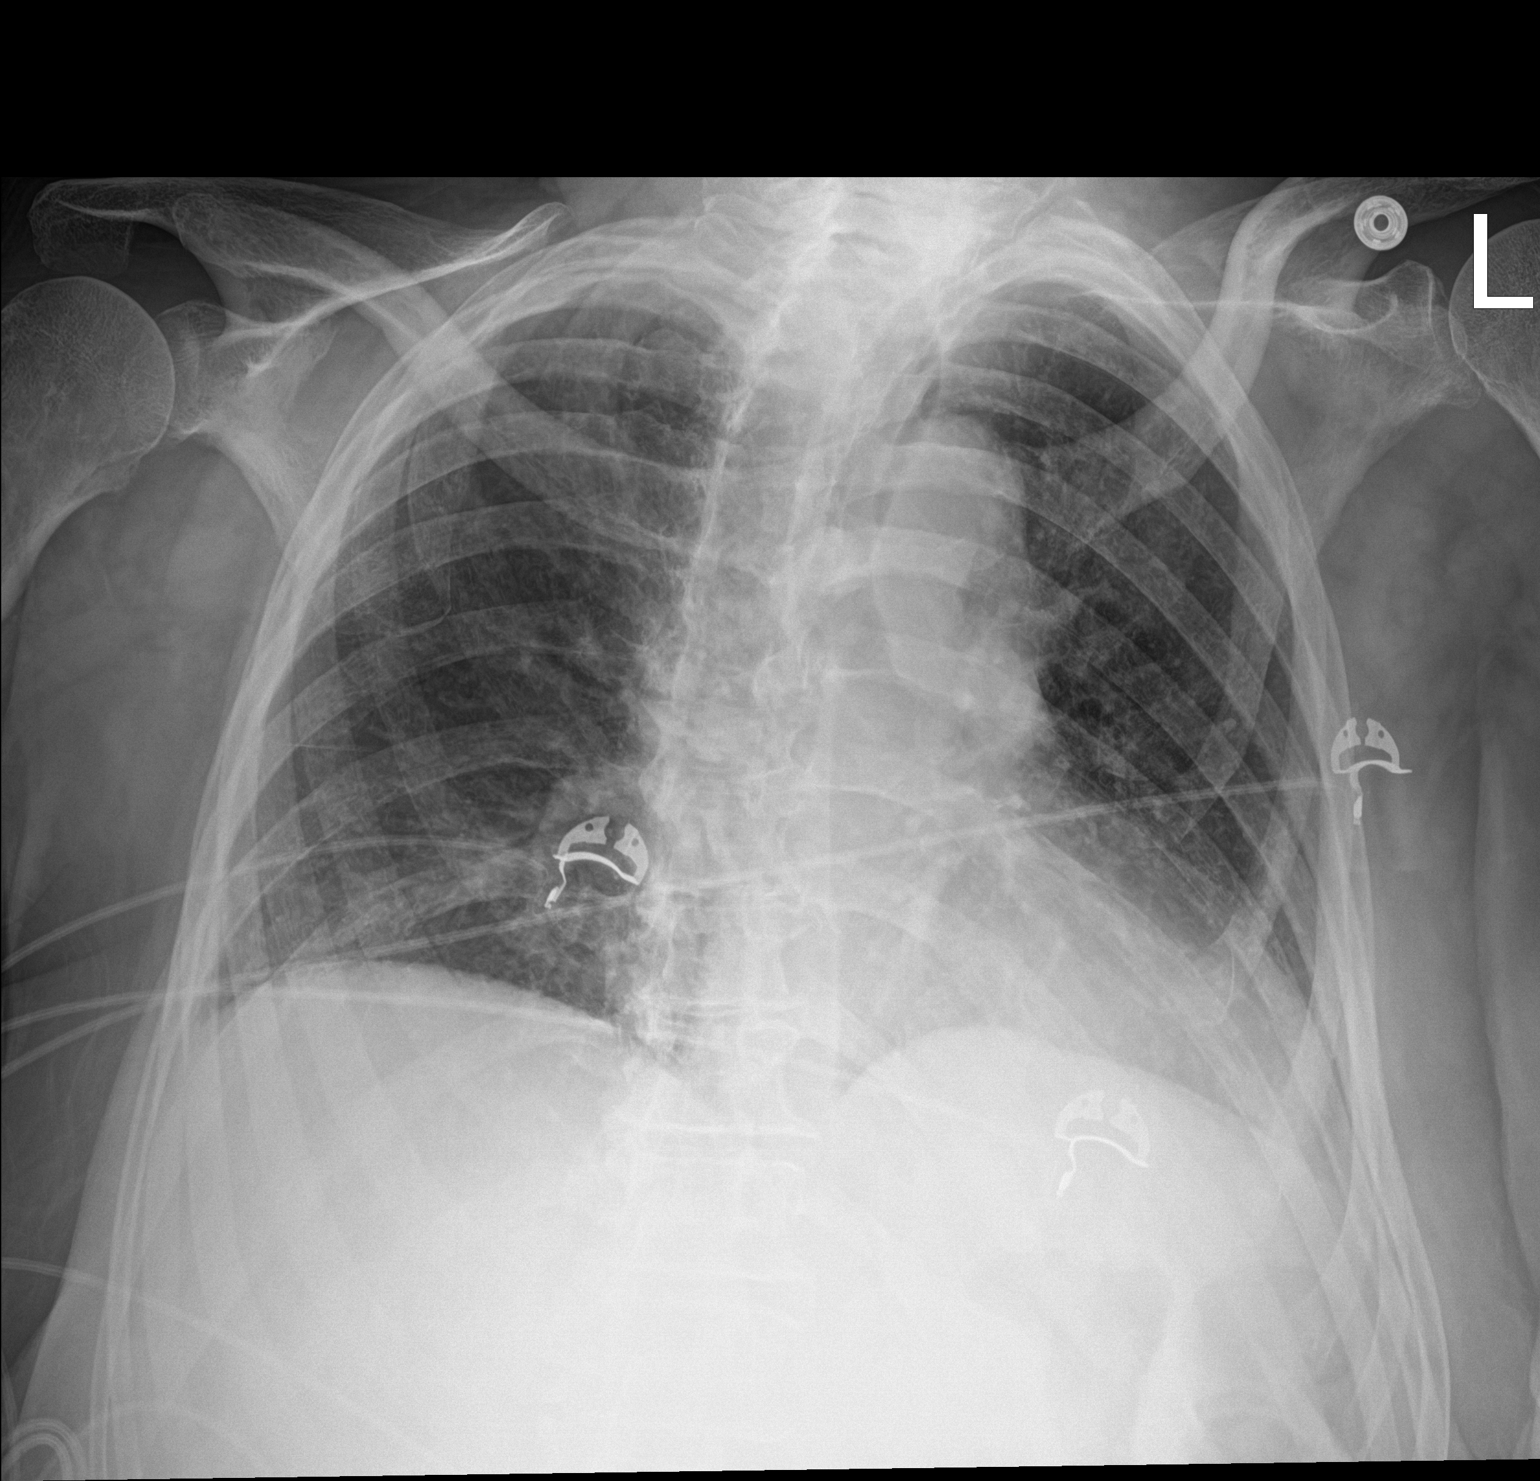

[1 of 1 positions shown; findings below may reference images not displayed]

FINDINGS: The heart size and mediastinal contours are within normal limits.
Both lungs are clear. The visualized skeletal structures are
unremarkable.
IMPRESSION: No active disease.

## 2021-06-14 MED ORDER — LORAZEPAM 2 MG/ML IJ SOLN
1.0000 mg | INTRAMUSCULAR | Status: DC | PRN
  Administered 2021-06-14 – 2021-06-17 (×3): 1 mg via INTRAVENOUS
  Filled 2021-06-14 (×4): qty 1

## 2021-06-14 MED ORDER — THIAMINE HCL 100 MG/ML IJ SOLN
500.0000 mg | Freq: Three times a day (TID) | INTRAVENOUS | Status: AC
  Administered 2021-06-14 – 2021-06-16 (×6): 500 mg via INTRAVENOUS
  Filled 2021-06-14 (×8): qty 5

## 2021-06-14 MED ORDER — DEXTROSE 5 % IV SOLN
10.0000 mg/kg | Freq: Two times a day (BID) | INTRAVENOUS | Status: DC
  Administered 2021-06-14 – 2021-06-15 (×3): 595 mg via INTRAVENOUS
  Filled 2021-06-14 (×5): qty 11.9

## 2021-06-14 MED ORDER — VANCOMYCIN HCL 2000 MG/400ML IV SOLN
2000.0000 mg | Freq: Once | INTRAVENOUS | Status: DC
  Filled 2021-06-14: qty 400

## 2021-06-14 MED ORDER — SODIUM CHLORIDE 0.9 % IV SOLN
2.0000 g | INTRAVENOUS | Status: DC
  Administered 2021-06-14 – 2021-06-17 (×17): 2 g via INTRAVENOUS
  Filled 2021-06-14 (×11): qty 2000
  Filled 2021-06-14: qty 2
  Filled 2021-06-14 (×13): qty 2000

## 2021-06-14 MED ORDER — LACTATED RINGERS IV BOLUS
500.0000 mL | Freq: Once | INTRAVENOUS | Status: AC
  Administered 2021-06-14: 500 mL via INTRAVENOUS

## 2021-06-14 MED ORDER — THIAMINE HCL 100 MG/ML IJ SOLN
250.0000 mg | Freq: Every day | INTRAVENOUS | Status: AC
  Administered 2021-06-17 – 2021-06-21 (×5): 250 mg via INTRAVENOUS
  Filled 2021-06-14 (×6): qty 2.5

## 2021-06-14 MED ORDER — VANCOMYCIN HCL 1250 MG/250ML IV SOLN
1250.0000 mg | INTRAVENOUS | Status: DC
  Filled 2021-06-14: qty 250

## 2021-06-14 MED ORDER — THIAMINE HCL 100 MG/ML IJ SOLN
100.0000 mg | Freq: Every day | INTRAMUSCULAR | Status: DC
  Administered 2021-06-22 – 2021-07-02 (×11): 100 mg via INTRAVENOUS
  Filled 2021-06-14 (×11): qty 2

## 2021-06-14 MED ORDER — DEXTROSE IN LACTATED RINGERS 5 % IV SOLN: INTRAVENOUS | Status: DC

## 2021-06-14 MED ORDER — SODIUM CHLORIDE 0.9 % IV SOLN
1.0000 g | INTRAVENOUS | Status: DC
  Administered 2021-06-14: 1 g via INTRAVENOUS
  Filled 2021-06-14: qty 10

## 2021-06-14 MED ORDER — LORAZEPAM 1 MG PO TABS: 1.0000 mg | ORAL_TABLET | ORAL | Status: DC | PRN

## 2021-06-14 MED ORDER — SODIUM CHLORIDE 0.9 % IV SOLN
2.0000 g | Freq: Two times a day (BID) | INTRAVENOUS | Status: DC
  Administered 2021-06-15 – 2021-06-18 (×6): 2 g via INTRAVENOUS
  Filled 2021-06-14: qty 20
  Filled 2021-06-14: qty 2
  Filled 2021-06-14 (×2): qty 20
  Filled 2021-06-14 (×3): qty 2
  Filled 2021-06-14 (×2): qty 20

## 2021-06-14 NOTE — Care Management Important Message (Signed)
Important Message  Patient Details  Name: Kristy [name] MRN: [id number] Date of Birth: 24-Nov-1945   Medicare Important Message Given:  Yes     Dorena Bodo 06/14/2021, 2:40 PM

## 2021-06-14 NOTE — Progress Notes (Signed)
Nurse enter pt. Room to give care to pt. Pt. Granddaughter stated she will be calling her mom to speak with me. Daughter stated she has not been updated on pt. Care, prognosis and future plan of care. Nurse informed pt. Daughter that pt. Is being worked up for multiple falls and encephalopathy. Pt. Asked why this was happening. Nurse inform pt. Daughter that she has heavy alcohol use. Pt. Daughter got very upset with nurse word choice, and said her mother not a heavy drinker. She used to drink a lot but currently on drinks 2 beers a night. Then hangs up the phone.

## 2021-06-14 NOTE — TOC Initial Note (Signed)
Transition of Care [facility]) - Initial/Assessment Note    Patient Details  Name: Kristy [name] MRN: [id number] Date of Birth: Jun 28, 1946  [facility] CM/SW Contact:    [name], LCSWA Phone Number: 06/14/2021, 4:02 PM  Clinical Narrative:                 CSW noted that pt is disoriented at this time and spoke with pt's daughter, [name] to discuss PT/OT recs. CSW explained SNF placement and daughter noted that they may be interested with the ultimate plan of pt returning home with family support. CSW gave bed offers that were available, and dtr was not interested, due to low ratings. She asked that pt be faxed out further and that distance was not an issue, they were looking for quality. CSW faxed pt out further. HH option was also discussed, and she noted she would speak with her family. Dtr noted pt has had two vaccines, but is unsure if she had a booster, and does not know where her card would be. SW will continue to follow for disposition.  Expected Discharge Plan: Skilled Nursing Facility Barriers to Discharge: Continued Medical Work up, SNF Pending bed offer   Patient Goals and CMS Choice Patient states their goals for this hospitalization and ongoing recovery are:: Pt unable to participate in goal setting due to disorientation. CMS Medicare.gov Compare Post Acute Care list provided to:: Patient Represenative (must comment) Choice offered to / list presented to : Adult Children  Expected Discharge Plan and Services Expected Discharge Plan: Skilled Nursing Facility     Post Acute Care Choice: Skilled Nursing Facility Living arrangements for the past 2 months: Single Family Home                                      Prior Living Arrangements/Services Living arrangements for the past 2 months: Single Family Home Lives with:: Spouse Patient language and need for interpreter reviewed:: Yes Do you feel safe going back to the place where you live?: Yes       Need for Family Participation in Patient Care: Yes (Comment) Care giver support system in place?: Yes (comment)   Criminal Activity/Legal Involvement Pertinent to Current Situation/Hospitalization: No - Comment as needed  Activities of Daily Living      Permission Sought/Granted Permission sought to share information with : Family Supports Permission granted to share information with : Yes, Verbal Permission Granted  Share Information with NAME: [name]     Permission granted to share info w Relationship: Daughter  Permission granted to share info w Contact Information: [phone]  Emotional Assessment Appearance:: Appears stated age Attitude/Demeanor/Rapport: Unable to Assess Affect (typically observed): Unable to Assess Orientation: : Oriented to Self, Oriented to Place Alcohol / Substance Use: Not Applicable Psych Involvement: No (comment)  Admission diagnosis:  Hypokalemia [E87.6] Fall [W19.XXXA] Generalized weakness [R53.1] Alcoholic hepatitis [K70.10] Patient Active Problem List   Diagnosis Date Noted   Altered mental status 06/14/2021   Acute delirium 06/14/2021   Sepsis (HCC) 06/14/2021   Fall 06/11/2021   Alcoholic hepatitis 06/11/2021   PCP:  Darrow Bussing, MD Pharmacy:   [id number] [place], [place] - [phone] AT [facility] & [address] [place] [address] Phone: [phone] Fax: [phone]     Social Determinants of Health (SDOH) Interventions  Readmission Risk Interventions No flowsheet data found.   

## 2021-06-14 NOTE — Progress Notes (Addendum)
Patient is NPO but not swallowing po meds. Is needing intermittent oral suction and mouth care for managing secretions. Night time provider paged for speech consult order. Keeping HOB above 30 degrees at this time. Continues to have nuchal rigidity. Will continue to monitor.

## 2021-06-14 NOTE — Progress Notes (Addendum)
Entered in error. Please see Neurology consult note from today.  Erick Blinks [facility] Pager Number [phone]

## 2021-06-14 NOTE — Progress Notes (Signed)
PT Cancellation Note  Patient Details Name: CAMILLA SKEEN MRN: [id number] DOB: 1946-04-08   Cancelled Treatment:    Reason Eval/Treat Not Completed: Medical issues which prohibited therapy. Rn reports pt just received ativan and requesting to hold PT. Will follow up as pt appropriate and as schedule allows.  Johnn Hai, SPT  Johnn Hai 06/14/2021, 2:00 PM

## 2021-06-14 NOTE — Progress Notes (Addendum)
PROGRESS NOTE  [name]  EUM:[id number] DOB: 24-Mar-1946 DOA: 06/10/2021 PCP: Darrow Bussing, MD   Brief Narrative: Kristy [name] is a 75 y.o. female with a history of alcohol use who presented to the ED for frequent falls, generalized weakness, and confusion. She was found to be afebrile, hypertensive, and without hypoxia. Also had hypokalemia (K 2.6), SCr 1.88 (above baseline ~1.5), LFT elevation (AST 576, ALT 305). Ammonia normal. EtOH, salicylate, tylenol levels negative. CT head and cervical spine were reassuring, though the patient was unable to rise to seated position from laying prompting consultation for admission. The patient was given potassium supplementation, started on CIWA protocol, and admitted with note made of continued AMS. She has remained weak and confused, though on 8/5 became less interactive, retaining urine, WBC noted to be rising. Blood and urine cultures ordered, ceftriaxone given with bacteriuria and urinary retention. MR brain has not been done yet. Neurology consulted with worsening mental status and nuchal rigidity. LP under fluoro requested, empirically treating meningitis/encephalitis.   Assessment & Plan: Principal Problem:   Altered mental status Active Problems:   Fall   Alcoholic hepatitis  Acute metabolic encephalopathy: Multifactorial, likely also a chronic component. CIWA scores have remained low and intake is said to only be a couple drinks daily. - Abrupt worsening 8/5. Neurology consulted.  - LP will require fluoro, studies ordered per neurology - Empiric coverage with vancomycin, ampicillin, ceftriaxone 2g, and acyclovir.  - Transfer to neuro-progressive care for close monitoring.   UTI: WBC rising, has remained afebrile.  - Check UA (was delayed) > nitrite-positive bacteriuria. And leukocytosis worsening on recheck 8/5 now with urinary retention.  - Initiated treatment with ceftriaxone, sent urine culture and blood cultures. Will be on CTX for  meningitis empiric coverage regardless.  Fall at home, severe weakness: CT head, cervical spine reassuring at admission. - PT/OT recommending SNF rehabilitation    Alcoholic hepatitis: Based on AST predominance of LFT elevations. Hepatitis panel nonreactive.  - No indication for prednisolone at this time. Continue trending. Add-on LFTs to AM labs.    Alcohol abuse: 2 beers per day of late, negative in ED.  - Continue CIWA. Monitor closely for withdrawal. May DC in AM if no need for ativan. - Cessation counseling to be repeated once pt more clear-headed. - Empirically supplementing thiamine, folate, MVM.   Hypokalemia: - Supplemented with resolution   Hyponatremia: Improved with isotonic saline  HTN: - Continue ARB, and increased norvasc.    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 CK wnl at 138. - SCr improved to putative baseline and now starting to worsen. Give LR bolus and start infusion - Monitor renal function closely with initiation of vancomycin, acyclovir.  Acute urinary retention: Possibly due to UTI. - I/O and serial bladder scan. - After I/O of 1.6L, abdominal exam improved.    HLD: - Hold statin for now.  DVT prophylaxis: Lovenox 30mg  Code Status: Full Family Communication: Granddaughter at bedside, spoke with daughter by phone at length as well. Disposition Plan:  Status is: Inpatient  Remains inpatient appropriate because:Persistent severe electrolyte disturbances, Altered mental status, and Unsafe d/c plan  Dispo: The patient is from: Home              Anticipated d/c is to: SNF              Patient currently is not medically stable to d/c.   Difficult to place patient No  Consults: Neurology Psychiatry  Procedures:  None  Antimicrobials: None   Subjective: No events reported overnight, but the patient is less interactive today. Not really engaging in any conversation., granddaughter at bedside says she's been sleeping all day, more so  when they gave ativan. She hasn't taken anything po today.   Update, with abnormal abd exam, bladder scan checked, 1.6L output with I/O cath. Repeat assessment afterward was mentally unchanged, still poorly interactive. No report of aspiration.  Objective: Vitals:   06/13/21 1743 06/14/21 0010 06/14/21 0535 06/14/21 1229  BP: (!) 154/80 (!) 141/78 (!) 141/84 (!) 156/76  Pulse: 99 (!) 107 (!) 105 (!) 103  Resp: 18 18  19   Temp: 98 F (36.7 C) 98.7 F (37.1 C) 99.2 F (37.3 C) 98.4 F (36.9 C)  TempSrc: Oral Oral Axillary Oral  SpO2: 100% 97% 99% 97%    Intake/Output Summary (Last 24 hours) at 06/14/2021 1302 Last data filed at 06/14/2021 0540 Gross per 24 hour  Intake 120 ml  Output 700 ml  Net -580 ml   Gen: 75 y.o. female in no distress Pulm: Nonlabored breathing room air. Clear. CV: Regular tachycardia without murmur, or gallop. No JVD or edema. GI: Abdominal exam limited due to pt's withdrawal and yelling with any palpation of any part of her body. She does continue to guard diffusely however.  Ext: Warm, no deformities Skin: No new rashes, lesions or ulcers on visualized skin. Neuro: Responds to voice, opens eyes, yells with most any palpation. Does not follow commands or seem to understand them. When I lift her head, her neck remains very rigid, moreso than extremities. Appears to have dysconjugate gaze with leftward preference as well.  Psych: UTD  Data Reviewed: I have personally reviewed following labs and imaging studies  CBC: Recent Labs  Lab 06/10/21 2105 06/11/21 0611  WBC 10.8* 13.9*  NEUTROABS 8.4*  --   HGB 15.2* 15.8*  HCT 45.3 46.1*  MCV 95.4 94.3  PLT 100* 106*   Basic Metabolic Panel: Recent Labs  Lab 06/10/21 2105 06/11/21 0000 06/11/21 0611 06/12/21 0333 06/12/21 1558 06/13/21 0306  NA 128*  --  132* 136 137 136  K 2.6*  --  3.3* 2.9* 3.7 4.3  CL 91*  --  95* 98 102 100  CO2 24  --  25 25 23 22   GLUCOSE 106*  --  86 116* 110* 84  BUN 39*   --  35* 31* 31* 40*  CREATININE 1.88*  --  1.66* 1.46* 1.51* 1.62*  CALCIUM 8.8*  --  8.9 9.2 9.4 9.8  MG  --  1.9  --   --   --  2.1   GFR: CrCl cannot be calculated (Unknown ideal weight.). Liver Function Tests: Recent Labs  Lab 06/10/21 2105 06/11/21 0611 06/12/21 0333  AST 576* 425* 138*  ALT 305* 315* 199*  ALKPHOS 101 117 92  BILITOT 3.0* 3.0* 3.3*  PROT 6.8 6.9 6.4*  ALBUMIN 2.1* 2.0* 1.8*   No results for input(s): LIPASE, AMYLASE in the last 168 hours. Recent Labs  Lab 06/11/21 0000  AMMONIA 25   Coagulation Profile: Recent Labs  Lab 06/10/21 2105  INR 1.2   Cardiac Enzymes: Recent Labs  Lab 06/10/21 2105  CKTOTAL 138   BNP (last 3 results) No results for input(s): PROBNP in the last 8760 hours. HbA1C: No results for input(s): HGBA1C in the last 72 hours. CBG: No results for input(s): GLUCAP in the last 168 hours. Lipid Profile: No results for input(s): CHOL,  HDL, LDLCALC, TRIG, CHOLHDL, LDLDIRECT in the last 72 hours. Thyroid Function Tests: Recent Labs    06/14/21 0303  TSH 3.885   Anemia Panel: Recent Labs    06/14/21 0303  VITAMINB12 674   Urine analysis:    Component Value Date/Time   COLORURINE YELLOW 06/12/2021 1625   APPEARANCEUR CLEAR 06/12/2021 1625   LABSPEC 1.015 06/12/2021 1625   PHURINE 7.0 06/12/2021 1625   GLUCOSEU NEGATIVE 06/12/2021 1625   HGBUR LARGE (A) 06/12/2021 1625   BILIRUBINUR SMALL (A) 06/12/2021 1625   KETONESUR NEGATIVE 06/12/2021 1625   PROTEINUR 30 (A) 06/12/2021 1625   UROBILINOGEN 0.2 05/14/2008 0020   NITRITE POSITIVE (A) 06/12/2021 1625   LEUKOCYTESUR TRACE (A) 06/12/2021 1625   Recent Results (from the past 240 hour(s))  Resp Panel by RT-PCR (Flu A&B, Covid) Nasopharyngeal Swab     Status: None   Collection Time: 06/11/21  3:13 AM   Specimen: Nasopharyngeal Swab; Nasopharyngeal(NP) swabs in vial transport medium  Result Value Ref Range Status   SARS Coronavirus 2 by RT PCR NEGATIVE NEGATIVE  Final    Comment: (NOTE) SARS-CoV-2 target nucleic acids are NOT DETECTED.  The SARS-CoV-2 RNA is generally detectable in upper respiratory specimens during the acute phase of infection. The lowest concentration of SARS-CoV-2 viral copies this assay can detect is 138 copies/mL. A negative result does not preclude SARS-Cov-2 infection and should not be used as the sole basis for treatment or other patient management decisions. A negative result may occur with  improper specimen collection/handling, submission of specimen other than nasopharyngeal swab, presence of viral mutation(s) within the areas targeted by this assay, and inadequate number of viral copies(<138 copies/mL). A negative result must be combined with clinical observations, patient history, and epidemiological information. The expected result is Negative.  Fact Sheet for Patients:  BloggerCourse.com  Fact Sheet for Healthcare Providers:  SeriousBroker.it  This test is no t yet approved or cleared by the [place] FDA and  has been authorized for detection and/or diagnosis of SARS-CoV-2 by FDA under an Emergency Use Authorization (EUA). This EUA will remain  in effect (meaning this test can be used) for the duration of the COVID-19 declaration under Section 564(b)(1) of the Act, 21 U.S.C.section 360bbb-3(b)(1), unless the authorization is terminated  or revoked sooner.       Influenza A by PCR NEGATIVE NEGATIVE Final   Influenza B by PCR NEGATIVE NEGATIVE Final    Comment: (NOTE) The Xpert Xpress SARS-CoV-2/FLU/RSV plus assay is intended as an aid in the diagnosis of influenza from Nasopharyngeal swab specimens and should not be used as a sole basis for treatment. Nasal washings and aspirates are unacceptable for Xpert Xpress SARS-CoV-2/FLU/RSV testing.  Fact Sheet for Patients: BloggerCourse.com  Fact Sheet for Healthcare  Providers: SeriousBroker.it  This test is not yet approved or cleared by the [place] FDA and has been authorized for detection and/or diagnosis of SARS-CoV-2 by FDA under an Emergency Use Authorization (EUA). This EUA will remain in effect (meaning this test can be used) for the duration of the COVID-19 declaration under Section 564(b)(1) of the Act, 21 U.S.C. section 360bbb-3(b)(1), unless the authorization is terminated or revoked.  Performed at [facility], [address], [place], [place] [address]       Radiology Studies: No results found.  Scheduled Meds:  allopurinol  300 mg Oral Daily   amLODipine  10 mg Oral Daily   aspirin  81 mg Oral Daily   enoxaparin (LOVENOX)  injection  30 mg Subcutaneous Daily   folic acid  1 mg Oral Daily   irbesartan  150 mg Oral Daily   multivitamin with minerals  1 tablet Oral Daily   thiamine  100 mg Oral Daily   Or   thiamine  100 mg Intravenous Daily   Continuous Infusions:  cefTRIAXone (ROCEPHIN)  IV       LOS: 3 days   Time spent: 35 minutes.  Tyrone Nineyan B Shelbia Scinto, MD [facility] [email or web address] 06/14/2021, 1:02 PM

## 2021-06-14 NOTE — Progress Notes (Signed)
Occupational Therapy Treatment Patient Details Name: Kristy [name] MRN: [id number] DOB: June 07, 1946 Today's Date: 06/14/2021    History of present illness Pt is a 75yo female presenting [facility] ED on 8/1 after a fall on 7/29. Pt also fell on 8/2 while in the ED. Pt found to be hypokalemic upon admission. Imaging showed no acute findings. PMH: HTN, polycthemia, alcohol use.   OT comments  Pt in be upon arrival with eyes closed with grand daughter present. Pt able to be awakened and speak, but keeping eyes closed through most of session. Pt required encouragement to attempt sitting EOB. Pt requires total A sup - sit and max A to maintain balance for simple grooming and UB dressing tasks. Pt sat EOB x 6 minutes. Pt required increased time to complete bed mobility tasks for sitting and back to supine. Pt moaning throughout that her neck, BLEs and back hurt. OT will continue to follow acutely to maximize level of function and safety  Follow Up Recommendations  SNF    Equipment Recommendations  3 in 1 bedside commode;Other (comment) (TBD at SNF)    Recommendations for Other Services      Precautions / Restrictions Precautions Precautions: Fall Restrictions Weight Bearing Restrictions: No       Mobility Bed Mobility Overal bed mobility: Needs Assistance Bed Mobility: Supine to Sit;Sit to Supine;Rolling Rolling: Total assist   Supine to sit: Total assist Sit to supine: Total assist   General bed mobility comments: total A attempting to sit EOB, max A for balance to sit EOB x 6 minutes, max A to maintain sitting balance    Transfers                 General transfer comment: unable  at this time    Balance Overall balance assessment: Needs assistance Sitting-balance support: Feet supported;Bilateral upper extremity supported Sitting balance-Leahy Scale: Poor Sitting balance - Comments: Required max A to maintain sitting balance                                    ADL either performed or assessed with clinical judgement   ADL Overall ADL's : Needs assistance/impaired     Grooming: Wash/dry hands;Wash/dry face;Maximal assistance;Sitting   Upper Body Bathing: Total assistance       Upper Body Dressing : Total assistance;Sitting           Toileting- Clothing Manipulation and Hygiene: Total assistance;Bed level         General ADL Comments: pt limited by pain, weakness and fatigue, keeps eyes closed majority of session     Vision Baseline Vision/History: No visual deficits Patient Visual Report: No change from baseline     Perception     Praxis      Cognition Arousal/Alertness: Suspect due to medications Behavior During Therapy: Agitated Overall Cognitive Status: Impaired/Different from baseline Area of Impairment: Attention;Following commands;Safety/judgement;Awareness;Memory                   Current Attention Level: Selective   Following Commands: Follows multi-step commands with increased time;Follows multi-step commands inconsistently                Exercises     Shoulder Instructions       General Comments      Pertinent Vitals/ Pain       Pain Assessment: Faces Faces Pain Scale: Hurts even more Pain Location: neck,  LEs and B shoulders with movement Pain Descriptors / Indicators: Grimacing;Guarding;Moaning Pain Intervention(s): Limited activity within patient's tolerance;Monitored during session;Repositioned  Home Living                                          Prior Functioning/Environment              Frequency  Min 2X/week        Progress Toward Goals  OT Goals(current goals can now be found in the care plan section)  Progress towards OT goals: OT to reassess next treatment  Acute Rehab OT Goals Patient Stated Goal: none stated  Plan Discharge plan remains appropriate    Co-evaluation                 AM-PAC OT "6 Clicks" Daily Activity      Outcome Measure   Help from another person eating meals?: A Lot Help from another person taking care of personal grooming?: A Lot Help from another person toileting, which includes using toliet, bedpan, or urinal?: Total Help from another person bathing (including washing, rinsing, drying)?: Total Help from another person to put on and taking off regular upper body clothing?: A Lot Help from another person to put on and taking off regular lower body clothing?: Total 6 Click Score: 9    End of Session    OT Visit Diagnosis: Unsteadiness on feet (R26.81);Other abnormalities of gait and mobility (R26.89);Repeated falls (R29.6);Muscle weakness (generalized) (M62.81);Other symptoms and signs involving cognitive function;Pain Pain - part of body: Shoulder (B LEs, neck)   Activity Tolerance Patient limited by pain;Patient limited by fatigue   Patient Left in bed;with bed alarm set;with call bell/phone within reach;with family/visitor present   Nurse Communication          Time: 1191-4782 OT Time Calculation (min): 24 min  Charges: OT General Charges $OT Visit: 1 Visit OT Treatments $Self Care/Home Management : 8-22 mins $Therapeutic Activity: 8-22 mins     Galen Manila 06/14/2021, 2:03 PM

## 2021-06-14 NOTE — Consult Note (Signed)
NEUROHOSPITALISTS-CONSULTATION NOTE    Requesting Physician: Dr. Jarvis Newcomer   Admit date: 06/10/21    Chief Complaint: New onset nuchal rigidity, white count, fever, change in mental status    History obtained from:  Chart review    HPI   Kristy [name] is a 75 y.o. female with a history of alcohol use who presented to the ED for frequent falls, generalized weakness, and confusion. She was found to be afebrile, hypertensive, and without hypoxia.  Work up was initiated by the hospitalist team; she was found to have hypokalemia (K 2.6), SCr 1.88 (above baseline ~1.5), LFT elevation (AST 576, ALT 305). Ammonia normal. EtOH, salicylate, tylenol levels negative. CT head and cervical spine were reassuring, though the patient was unable to rise to seated position from laying prompting consultation for admission. The patient was given potassium supplementation, started on CIWA protocol, and admitted with note made of continued AMS, hallucinations. Creatinine and LFTs have improved, though potassium is refractory to supplementation. She remains confused.  On 06/14/21 Neurology was consulted given change in mental status, nuchal rigidity, fever and white count increase from 13.9 to 25.3.  Patient is unable to provide any meaningful history 2/2 encephalopathy. Most of the history obtained from grand daughter at bedside and from Dr. Jarvis Newcomer.    Past Medical History   Alcohol use    Past Surgical History  No past surgical history on file.   Family History   No family history on file.  Social History  Social History:  has no history on file for tobacco use, alcohol use, and drug use.   Allergies  Allergies: No Known Allergies   Medications Prior to Admission    I have reviewed the patient's current medications.   ROS  History obtained from the patient Unable to obtain detailed ROS 2/2 encephalopathy.  Physical Examination   Constitutional: Uncomfortable appearing African  American female resting in bed  Respiratory: Unlabored respirations  Cardiac: RRR   Neurological:  MS: Oriented to self only, followed commands with coaching ( wiggles toes, thumbs up bilaterally) Speech: Speaks occasionally when agitated by examination- stated "What" and moaned when ask to move extremities. Does not participate in naming and repetition CN: Dysconjugate gaze, Visual fields appear full with BTT, Face symmetric, Does not cooperate to stick tongue out to assess it Motor: Normal bulk. She is tense in both arms with hands flexed. Withdraws in BUE and BLE. Unable to do strength testing due to pain. Coordination: UTA  Sensation: Intact to noxious stimulation  Gait: Deferred due to weakness   Pertinent Imaging    06/11/21 CT Head  NAICP   06/11/21 CT Cervical Spine WO Contrast  No acute displaced fracture or traumatic listhesis of the cervical spine.  06/14/21 CT Head(personally reviewed) 1. Stable age related cerebral atrophy, ventriculomegaly and periventricular white matter disease. 2. No acute intracranial findings or mass lesions.    Impression and Plan    Kristy [name] is a 75 year old female w/pmh of  alcohol use who is admitted 2/2 frequent falls, generalized weakness, and confusion and today developed acute onset nuchal rigidity, change in mental status, fever and an increase in WBC from 13.9 to 25.3.  Her neurological examination is pertinent for nuchal rigidity- she is holding her neck stiffly and when the examiner attempts to move it, she is clearly in pain.   Encephalopathy labs with normal B12, TSH, no elevated Ammonia.  Considering her increase in white count + fever need to rule out meningitis.  Recommendations:  - Empiric antibiotic treatment with Vancomycin, Ceftriaxone, Ampicillin, Acyclovir  - With the extent of her encephalopathy and pain, it is going to be difficult to get a lumbar puncture at bedside to evaluate for meningitis. Would recommend attempting  under fluoro with some antianxiety or potential sedation. - CSF cell count, differential, protein, glucose, csf gram stain and CSF cultures, HSV 1/2 PCR and Cryptococcus Ag, Strep Pneumo urinary antigen. - MRI Brain without contrast, will hold off on contrast 2/2 kidney dysfunction. - Thiamine Wernicke's dose with 500 IV TID x 2 days, then 250mg  daily x 5 days, then 100mg  daily. Can transition to PO 100mg  daily at discharge. - folate levels pending.  This patient is critically ill and at significant risk of neurological worsening, death and care requires constant monitoring of vital signs, hemodynamics,respiratory and cardiac monitoring, neurological assessment, discussion with family, other specialists and medical decision making of high complexity. I spent 35 minutes of neurocritical care time  in the care of  this patient. This was time spent independent of any time provided by nurse practitioner or PA.  [facility] Pager Number 06/14/2021  7:51 PM   Erick Blinks [facility] Pager Number [phone]

## 2021-06-14 NOTE — Consult Note (Signed)
[facility] Face-to-Face Psychiatry Consult   Reason for Consult:  Altered Mental Status Referring Physician:  Hazeline Junker, MD Patient Identification: [name] MRN:  [id number] Principal Diagnosis: Acute delirium 2/2 sepsis Diagnosis:  Principal Problem:   Acute delirium Active Problems:   Fall   Alcoholic hepatitis   Altered mental status   Sepsis (HCC)   Total Time spent with patient: 15 minutes  Subjective:   Kristy [name] is a 75 y.o. female patient admitted with altered mental status and multiple falls.  On interview she was lethargic, and in and out of sleep.  Overall she was a poor historian, but she was arousable at times to answer some questions.  She was oriented to person but not to time nor place.  When asked about her mood, she said that nothing has been going on.  She denies feeling down, depressed, anhedonic, or hopeless over the past 2 weeks.  She denies ever seeing a psychiatrist and having any psychiatric diagnoses.  She did endorse some dysuria but denies changes in odor to her urine as well as hematuria.  She denies any problems with defecation.  She says that she smokes cigarettes daily, but was unable to quantify, just stating that it was less than a pack per day.  She also endorses drinking alcohol, a couple of beers daily (of which she says her last was yesterday).  She denies SI/HI/AVH.  The interview was discontinued at this point due to limited arousal and engagement.  HPI:  [name] is a 75 year old female with a psychiatric history of alcohol use disorder, uncomplicated precipitating multiple ED visits, and a medical history of HTN, HLD, and Gout who was admitted for evaluation of altered mental status and multiple falls since 7/29.  Past Psychiatric History: Alcohol use disorder, mild, uncomplicated  Risk to Self:  Possibly Risk to Others:  No Prior Inpatient Therapy:  Denies Prior Outpatient Therapy:  Denies  Past Medical History: No past medical history on  file. No past surgical history on file. Family History: No family history on file. Family Psychiatric  History: Unknown Social History:  Social History   Substance and Sexual Activity  Alcohol Use Not on file     Social History   Substance and Sexual Activity  Drug Use Not on file    Social History   Socioeconomic History   Marital status: Married    Spouse name: Not on file   Number of children: Not on file   Years of education: Not on file   Highest education level: Not on file  Occupational History   Not on file  Tobacco Use   Smoking status: Not on file   Smokeless tobacco: Not on file  Substance and Sexual Activity   Alcohol use: Not on file   Drug use: Not on file   Sexual activity: Not on file  Other Topics Concern   Not on file  Social History Narrative   Not on file   Social Determinants of Health   Financial Resource Strain: Not on file  Food Insecurity: Not on file  Transportation Needs: Not on file  Physical Activity: Not on file  Stress: Not on file  Social Connections: Not on file   Additional Social History:    Allergies:  No Known Allergies  Labs:  Results for orders placed or performed during the hospital encounter of 06/10/21 (from the past 48 hour(s))  Basic metabolic panel     Status: Abnormal  Collection Time: 06/12/21  3:58 PM  Result Value Ref Range   Sodium 137 135 - 145 mmol/L   Potassium 3.7 3.5 - 5.1 mmol/L    Comment: DELTA CHECK NOTED SLIGHT HEMOLYSIS    Chloride 102 98 - 111 mmol/L   CO2 23 22 - 32 mmol/L   Glucose, Bld 110 (H) 70 - 99 mg/dL    Comment: Glucose reference range applies only to samples taken after fasting for at least 8 hours.   BUN 31 (H) 8 - 23 mg/dL   Creatinine, Ser 3.53 (H) 0.44 - 1.00 mg/dL   Calcium 9.4 8.9 - 61.4 mg/dL   GFR, Estimated 36 (L) >60 mL/min    Comment: (NOTE) Calculated using the CKD-EPI Creatinine Equation (2021)    Anion gap 12 5 - 15    Comment: Performed at [facility], [address] [place], [place] [address]  Urinalysis, Routine w reflex microscopic Urine, Clean Catch     Status: Abnormal   Collection Time: 06/12/21  4:25 PM  Result Value Ref Range   Color, Urine YELLOW YELLOW   APPearance CLEAR CLEAR   Specific Gravity, Urine 1.015 1.005 - 1.030   pH 7.0 5.0 - 8.0   Glucose, UA NEGATIVE NEGATIVE mg/dL   Hgb urine dipstick LARGE (A) NEGATIVE   Bilirubin Urine SMALL (A) NEGATIVE   Ketones, ur NEGATIVE NEGATIVE mg/dL   Protein, ur 30 (A) NEGATIVE mg/dL   Nitrite POSITIVE (A) NEGATIVE   Leukocytes,Ua TRACE (A) NEGATIVE    Comment: Performed at [facility], [address], [place], [place] [address]  Rapid urine drug screen (hospital performed)     Status: None   Collection Time: 06/12/21  4:25 PM  Result Value Ref Range   Opiates NONE DETECTED NONE DETECTED   Cocaine NONE DETECTED NONE DETECTED   Benzodiazepines NONE DETECTED NONE DETECTED   Amphetamines NONE DETECTED NONE DETECTED   Tetrahydrocannabinol NONE DETECTED NONE DETECTED   Barbiturates NONE DETECTED NONE DETECTED    Comment: (NOTE) DRUG SCREEN FOR MEDICAL PURPOSES ONLY.  IF CONFIRMATION IS NEEDED FOR ANY PURPOSE, NOTIFY LAB WITHIN 5 DAYS.  LOWEST DETECTABLE LIMITS FOR URINE DRUG SCREEN Drug Class                     Cutoff (ng/mL) Amphetamine and metabolites    1000 Barbiturate and metabolites    200 Benzodiazepine                 200 Tricyclics and metabolites     300 Opiates and metabolites        300 Cocaine and metabolites        300 THC                            50 Performed at [facility], [address], [place], [place] [address]   Urinalysis, Microscopic (reflex)     Status: Abnormal   Collection Time: 06/12/21  4:25 PM  Result Value Ref Range   RBC / HPF 11-20 0 - 5 RBC/hpf   WBC, UA 0-5 0 - 5 WBC/hpf   Bacteria, UA MANY (A) NONE SEEN   Squamous Epithelial / LPF 0-5 0 - 5    Comment: Performed at [facility], [address], [place], [place] [address]  Basic metabolic panel     Status: Abnormal   Collection Time: 06/13/21  3:06 AM  Result Value Ref Range   Sodium 136 135 - 145 mmol/L   Potassium 4.3 3.5 - 5.1 mmol/L   Chloride 100 98 - 111 mmol/L   CO2 22 22 - 32 mmol/L   Glucose, Bld 84 70 - 99 mg/dL    Comment: Glucose reference range applies only to samples taken after fasting for at least 8 hours.   BUN 40 (H) 8 - 23 mg/dL   Creatinine, Ser 1.611.62 (H) 0.44 - 1.00 mg/dL   Calcium 9.8 8.9 - 09.610.3 mg/dL   GFR, Estimated 33 (L) >60 mL/min    Comment: (NOTE) Calculated using the CKD-EPI Creatinine Equation (2021)    Anion gap 14 5 - 15    Comment: Performed at [facility], [address] [place], [place] [address]  Magnesium     Status: None   Collection Time: 06/13/21  3:06 AM  Result Value Ref Range   Magnesium 2.1 1.7 - 2.4 mg/dL    Comment: Performed at [facility], [address] [place], [place] [address]  TSH     Status: None   Collection Time: 06/14/21  3:03 AM  Result Value Ref Range   TSH 3.885 0.350 - 4.500 uIU/mL    Comment: Performed by a 3rd Generation assay with a functional sensitivity of <=0.01 uIU/mL. Performed at [facility], [address] [place], [place] [address]   Vitamin B12     Status: None   Collection Time: 06/14/21  3:03 AM  Result Value Ref Range   Vitamin B-12 674 180 - 914 pg/mL    Comment: (NOTE) This assay is not validated for testing neonatal or myeloproliferative syndrome specimens for Vitamin B12 levels. Performed at [facility], [address] [place], [place] [address]   RPR     Status: None   Collection Time: 06/14/21  3:03 AM  Result Value Ref Range   RPR Ser Ql NON REACTIVE NON REACTIVE    Comment: Performed at [facility], [address] [place], [place] [address]  C-reactive protein     Status: Abnormal   Collection Time: 06/14/21  1:15 PM  Result Value Ref Range   CRP 38.0 (H) <1.0 mg/dL    Comment: Performed  at [facility], [address] [place], [place] [address]  CBC     Status: Abnormal   Collection Time: 06/14/21  1:15 PM  Result Value Ref Range   WBC 25.3 (H) 4.0 - 10.5 K/uL    Comment: REPEATED TO VERIFY WHITE COUNT CONFIRMED ON SMEAR    RBC 4.24 3.87 - 5.11 MIL/uL   Hemoglobin 13.7 12.0 - 15.0 g/dL   HCT 62.940.7 52.836.0 - 41.346.0 %   MCV 96.0 80.0 - 100.0 fL   MCH 32.3 26.0 - 34.0 pg   MCHC 33.7 30.0 - 36.0 g/dL   RDW 24.413.5 01.011.5 - 27.215.5 %   Platelets 205 150 - 400 K/uL    Comment: REPEATED TO VERIFY   nRBC 0.0 0.0 - 0.2 %    Comment: Performed at [facility], [address] [place], [place] [address]  Urinalysis, Routine w reflex microscopic In/Out Cath Urine     Status: Abnormal   Collection Time: 06/14/21  1:38 PM  Result Value Ref Range   Color, Urine AMBER (A) YELLOW    Comment: BIOCHEMICALS MAY BE AFFECTED BY COLOR   APPearance HAZY (A) CLEAR   Specific Gravity,  Urine 1.011 1.005 - 1.030   pH 6.0 5.0 - 8.0   Glucose, UA NEGATIVE NEGATIVE mg/dL   Hgb urine dipstick LARGE (A) NEGATIVE   Bilirubin Urine NEGATIVE NEGATIVE   Ketones, ur NEGATIVE NEGATIVE mg/dL   Protein, ur NEGATIVE NEGATIVE mg/dL   Nitrite NEGATIVE NEGATIVE   Leukocytes,Ua NEGATIVE NEGATIVE   RBC / HPF 21-50 0 - 5 RBC/hpf   WBC, UA 0-5 0 - 5 WBC/hpf   Bacteria, UA RARE (A) NONE SEEN   Amorphous Crystal PRESENT     Comment: Performed at [facility], [address], [place], [place] [address]    Current Facility-Administered Medications  Medication Dose Route Frequency Provider Last Rate Last Admin   acetaminophen (TYLENOL) tablet 650 mg  650 mg Oral Q6H PRN Dow Adolph N, DO       allopurinol (ZYLOPRIM) tablet 300 mg  300 mg Oral Daily Dow Adolph N, DO   300 mg at 06/13/21 0950   amLODipine (NORVASC) tablet 10 mg  10 mg Oral Daily Tyrone Nine, MD   10 mg at 06/13/21 0950   aspirin chewable tablet 81 mg  81 mg Oral Daily Darlin Drop, DO   81 mg at 06/13/21 0950   cefTRIAXone  (ROCEPHIN) 1 g in sodium chloride 0.9 % 100 mL IVPB  1 g Intravenous Q24H Hazeline Junker B, MD 200 mL/hr at 06/14/21 1322 1 g at 06/14/21 1322   enoxaparin (LOVENOX) injection 30 mg  30 mg Subcutaneous Daily Dow Adolph N, DO   30 mg at 06/13/21 8768   folic acid (FOLVITE) tablet 1 mg  1 mg Oral Daily Hazeline Junker B, MD   1 mg at 06/13/21 0950   irbesartan (AVAPRO) tablet 150 mg  150 mg Oral Daily Tyrone Nine, MD   150 mg at 06/13/21 0950   LORazepam (ATIVAN) tablet 1 mg  1 mg Oral Q4H PRN Tyrone Nine, MD       And   LORazepam (ATIVAN) injection 1 mg  1 mg Intravenous Q4H PRN Tyrone Nine, MD   1 mg at 06/14/21 1323   melatonin tablet 3 mg  3 mg Oral QHS PRN Darlin Drop, DO       multivitamin with minerals tablet 1 tablet  1 tablet Oral Daily Tyrone Nine, MD   1 tablet at 06/13/21 0950   ondansetron (ZOFRAN) injection 4 mg  4 mg Intravenous Q6H PRN Dow Adolph N, DO       polyethylene glycol (MIRALAX / GLYCOLAX) packet 17 g  17 g Oral Daily PRN Hall, Carole N, DO       thiamine tablet 100 mg  100 mg Oral Daily Tyrone Nine, MD   100 mg at 06/13/21 1157   Or   thiamine (B-1) injection 100 mg  100 mg Intravenous Daily Tyrone Nine, MD        Musculoskeletal: Strength & Muscle Tone: decreased Gait & Station: unable to stand, per OT note requires maximal assistance Patient leans: N/A    Psychiatric Specialty Exam:  Presentation  General Appearance: Other (comment) (Patient lying in bed completely covered with sheet, so unable to properly assess)  Eye Contact:None  Speech:Garbled; Slow  Speech Volume:Decreased  Handedness: No data recorded  Mood and Affect  Mood:Euthymic  Affect:Restricted   Thought Process  Thought Processes:Other (comment) (UTA; patient answered questions in short, minimally worded answers.)  Descriptions of Associations:Intact  Orientation:Partial (Only to person. Day: Friday, but  did not know month or year. Place: Church, Sunday  School)  Thought Content:Other (comment) (UTA)  History of Schizophrenia/Schizoaffective disorder:No data recorded Duration of Psychotic Symptoms:No data recorded Hallucinations:Hallucinations: None  Ideas of Reference:Other (comment) (UTA)  Suicidal Thoughts:Suicidal Thoughts: No  Homicidal Thoughts:No data recorded  Sensorium  Memory:Other (comment) (UTA)  Judgment:Other (comment) (UTA)  Insight:Other (comment) (UTA)   Executive Functions  Concentration:Other (comment) (UTA)  Attention Span:Poor  Recall:Poor  Fund of Knowledge:Other (comment) (UTA)  Language:Other (comment) (UTA)   Psychomotor Activity  Psychomotor Activity:Psychomotor Activity: Other (comment) (UTA)   Assets  Assets:Other (comment) (UTA)   Sleep  Sleep:Sleep: -- (Patient in and out of sleep on arrival, but unknown.)   Physical Exam: Physical Exam HENT:     Head: Normocephalic and atraumatic.     Mouth/Throat:     Mouth: Mucous membranes are dry.  Cardiovascular:     Rate and Rhythm: Tachycardia present.  Pulmonary:     Effort: Pulmonary effort is normal.  Neurological:     Mental Status: She is disoriented.   Review of Systems  Unable to perform ROS: Mental status change  Genitourinary:  Positive for dysuria. Negative for hematuria.       Patient awakened briefly when asked about urinary sx.  Psychiatric/Behavioral:  Negative for depression, hallucinations, substance abuse and suicidal ideas. The patient is not nervous/anxious and does not have insomnia.        Endorses only drinking a "couple beers" daily.  Blood pressure (!) 156/76, pulse (!) 103, temperature 98.4 F (36.9 C), temperature source Oral, resp. rate 19, SpO2 97 %. There is no height or weight on file to calculate BMI.  Treatment Plan Summary: Daily contact with patient to assess and evaluate symptoms and progress in treatment -Due to patient's age and lack of other identifiable cause of AMS (low CIWA scores,  reassuring CT head, negative alcoholic hepatitis panel, and Ethanol, Ammonia, and Lactic Acid levels WNL), positive nitrites (many), trace leukocytes, and hematuria on reflex UA and many bacteria on microscopic UA, recommend to obtain repeat UA, UrCx (ordered), repeat CBC, and CRP to rule out infectious causes, UTI vs. disseminated systemic infection. Patient meets SIRS criteria with elevated HR (105-107 today,8/5, MR 103) elevated WBC (13.9 on 8/3, 25.3 on repeat CBC), elevated CRP (38) and elevated Cr (1.88, baseline 1.5) and should be started on broad-spectrum antibiotics. Patient's presentation is not likely to be of a psychiatric etiology.   Psychiatry will sign off at this time. Please do not hesitate to call back if questions or concerns arise. Thank you for this consult.     Disposition: No evidence of imminent risk to self or others at present.   Continued medical evaluation.    This chart was dictated using voice recognition software, Dragon. Despite the best efforts of this provider to proofread and correct errors, errors may still occur which can change documentation meaning.   Lamar Sprinkles, MD 06/14/2021 3:06 PM

## 2021-06-14 NOTE — Progress Notes (Signed)
Occupational Therapy Treatment Patient Details Name: Kristy [name] MRN: [id number] DOB: Mar 25, 1946 Today's Date: 06/14/2021    History of present illness Pt is a 75yo female presenting to [facility] ED on 8/1 after a fall on 7/29. Pt also fell on 8/2 while in the ED. Pt found to be hypokalemic upon admission. Imaging showed no acute findings. PMH: HTN, polycthemia, alcohol use.   OT comments  Late entry for 06/13/2021: Pt making slow progress with functional goals. Pt lethargic and required total A to sit EOB with max - mod A for sitting balance, max A with simple grooming and to don clean gown. Pt sat EOB x 4 minutes and require total a to return to supine. OT will continue to follow acutely to maximize level of function and safety  Follow Up Recommendations  SNF    Equipment Recommendations  3 in 1 bedside commode;Other (comment) (TBD at SNF)    Recommendations for Other Services      Precautions / Restrictions Precautions Precautions: Fall Restrictions Weight Bearing Restrictions: No       Mobility Bed Mobility Overal bed mobility: Needs Assistance Bed Mobility: Supine to Sit;Sit to Supine;Rolling Rolling: Total assist   Supine to sit: Total assist Sit to supine: Total assist   General bed mobility comments: total A attempting to sit EOB, max A for balance to sit EOB x 4 minutes    Transfers                 General transfer comment: unable  at this time    Balance Overall balance assessment: Needs assistance Sitting-balance support: Feet supported Sitting balance-Leahy Scale: Poor Sitting balance - Comments: Required max A to maintain initially and then required mod A                                   ADL either performed or assessed with clinical judgement   ADL Overall ADL's : Needs assistance/impaired     Grooming: Wash/dry hands;Wash/dry face;Maximal assistance;Sitting           Upper Body Dressing : Total assistance;Sitting                      General ADL Comments: pt limited by pain, weakness and fatigue     Vision Patient Visual Report: No change from baseline     Perception     Praxis      Cognition Arousal/Alertness: Awake/alert;Lethargic Behavior During Therapy: Flat affect                                            Exercises     Shoulder Instructions       General Comments      Pertinent Vitals/ Pain       Pain Assessment: Faces Faces Pain Scale: Hurts little more Pain Location: neck and B shoulders Pain Descriptors / Indicators: Grimacing;Guarding;Moaning Pain Intervention(s): Limited activity within patient's tolerance;Monitored during session;Repositioned  Home Living                                          Prior Functioning/Environment              Frequency  Min 2X/week        Progress Toward Goals  OT Goals(current goals can now be found in the care plan section)  Progress towards OT goals: Progressing toward goals  Acute Rehab OT Goals Patient Stated Goal: go home  Plan Discharge plan remains appropriate    Co-evaluation                 AM-PAC OT "6 Clicks" Daily Activity     Outcome Measure   Help from another person eating meals?: A Lot Help from another person taking care of personal grooming?: A Lot Help from another person toileting, which includes using toliet, bedpan, or urinal?: Total Help from another person bathing (including washing, rinsing, drying)?: Total Help from another person to put on and taking off regular upper body clothing?: A Lot Help from another person to put on and taking off regular lower body clothing?: Total 6 Click Score: 9    End of Session    OT Visit Diagnosis: Unsteadiness on feet (R26.81);Other abnormalities of gait and mobility (R26.89);Repeated falls (R29.6);Muscle weakness (generalized) (M62.81);Other symptoms and signs involving cognitive function;Pain Pain - part of  body: Shoulder (Both, generalized)   Activity Tolerance Other (comment);Patient limited by pain (limited by cognition)   Patient Left in bed   Nurse Communication          Time: 6789-3810 OT Time Calculation (min): 15 min  Charges: OT General Charges $OT Visit: 1 Visit OT Treatments $Self Care/Home Management : 8-22 mins     Galen Manila 06/14/2021, 9:47 AM

## 2021-06-14 NOTE — Progress Notes (Signed)
Pharmacy Antibiotic Note  Kristy [name] is a 75 y.o. female admitted on 06/10/2021 with possible meningitis.  Pharmacy has been consulted for Acyclovir and Vnacomycin dosing.  CrCL 33 ml/min. BMI >30 - will use ideal body weight for acyclovir dosing.  Plan: Start Acyclovir 593 mg IV q12hr Start Vancomycin 2000 mg IV x 1, then 1250 mg IV q24hr Monitor renal function, clinical status, C&S and vanc levels as indicated.  Height: 5\' 6"  (167.6 cm) Weight: 85.5 kg (188 lb 7.9 oz) IBW/kg (Calculated) : 59.3  Temp (24hrs), Avg:98.6 F (37 C), Min:98 F (36.7 C), Max:99.2 F (37.3 C)  Recent Labs  Lab 06/10/21 2105 06/11/21 0611 06/12/21 0333 06/12/21 1558 06/13/21 0306 06/14/21 1315  WBC 10.8* 13.9*  --   --   --  25.3*  CREATININE 1.88* 1.66* 1.46* 1.51* 1.62*  --   LATICACIDVEN 1.9  --   --   --   --   --     Estimated Creatinine Clearance: 33.1 mL/min (A) (by C-G formula based on SCr of 1.62 mg/dL (H)).    No Known Allergies  Antimicrobials this admission: Ampicillin 8/5 >>  Ceftriaxone 8/5 >>  Acyclovir 8/5 >> Vancomycin 8/5 >>  Thank you for allowing pharmacy to be a part of this patient's care.  10/5, PharmD, [facility] Clinical Pharmacist Please see AMION for all Pharmacists' Contact Phone Numbers 06/14/2021, 4:38 PM

## 2021-06-15 ENCOUNTER — Inpatient Hospital Stay (HOSPITAL_COMMUNITY): Payer: Medicare Other

## 2021-06-15 LAB — STREP PNEUMONIAE URINARY ANTIGEN: Strep Pneumo Urinary Antigen: NEGATIVE

## 2021-06-15 IMAGING — MR MR HEAD W/O CM
12 of 13 series · 44 of 48 positions shown · non-contrast
Comparison: Head CT [DATE]

CLINICAL DATA: Mental status change, unknown cause. Concern for
meningitis.

EXAM:
MRI HEAD WITHOUT CONTRAST
TECHNIQUE: Multiplanar, multiecho pulse sequences of the brain and surrounding
structures were obtained without intravenous contrast.

[Series 5: DWI · axial · 3.0mm · 0.88mm/px · z∈[-13,+115]mm · 8 of 96 slices shown (1 of 4)]
[im 1/96]
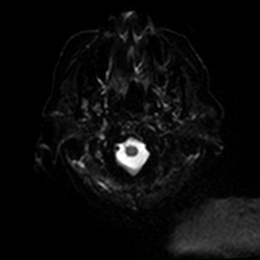
[im 14/96]
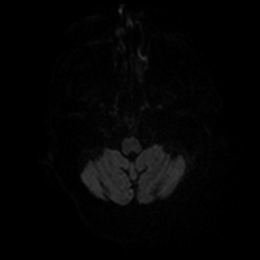
[im 28/96]
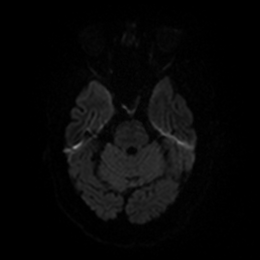
[im 41/96]
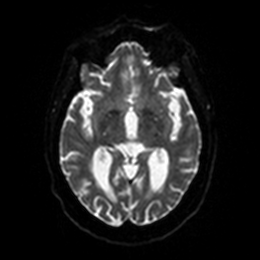
[im 55/96]
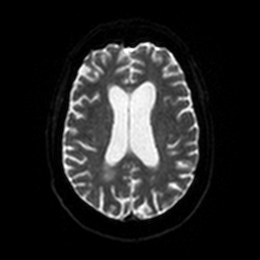
[im 68/96]
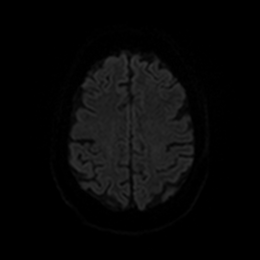
[im 82/96]
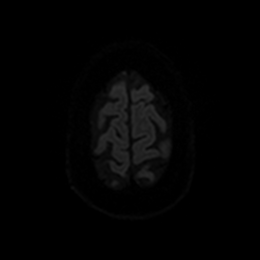
[im 96/96]
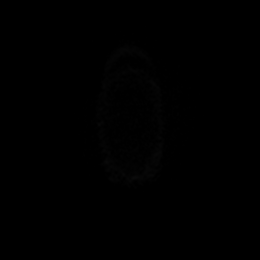

[Series 6: DWI · axial · 3.0mm · 0.88mm/px · z∈[-13,+115]mm · 4 of 48 slices shown (2 of 4)]
[im 1/48]
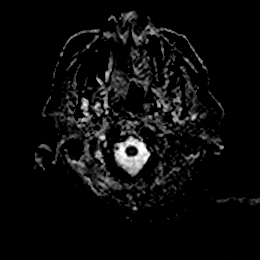
[im 16/48]
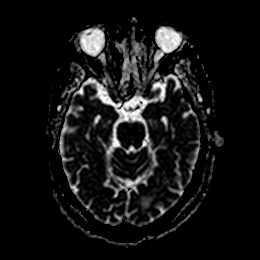
[im 32/48]
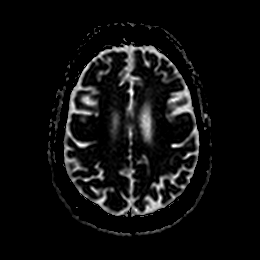
[im 48/48]
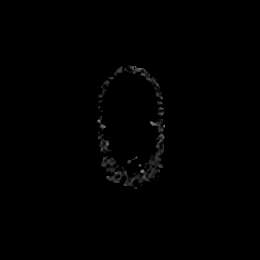

[Series 7: DWI · coronal · 4.0mm · 0.88mm/px · 5 of 68 slices shown (3 of 4)]
[im 1/68]
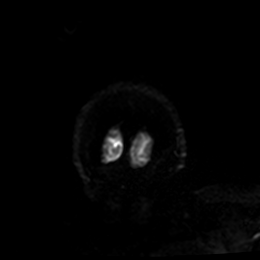
[im 17/68]
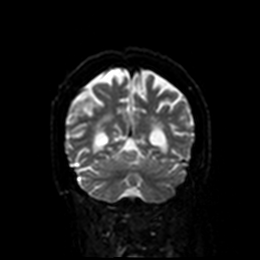
[im 34/68]
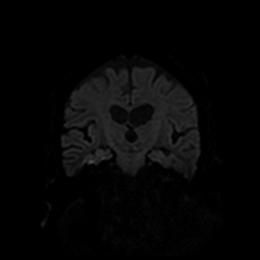
[im 51/68]
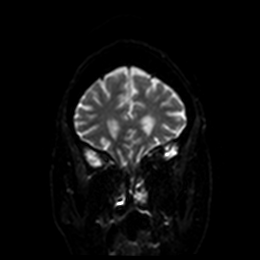
[im 68/68]
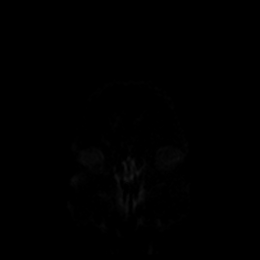

[Series 8: DWI · coronal · 4.0mm · 0.88mm/px · 3 of 34 slices shown (4 of 4)]
[im 1/34]
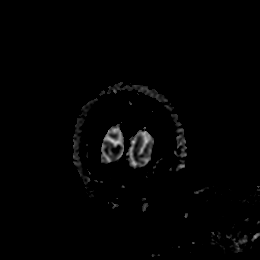
[im 17/34]
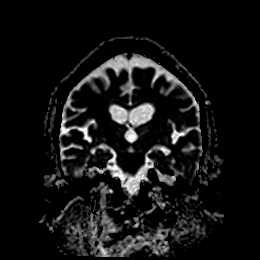
[im 34/34]
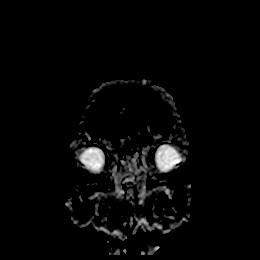

[Series 9: T1 · sagittal · 5.0mm · 0.75mm/px · 2 of 23 slices shown]
[im 1/23]
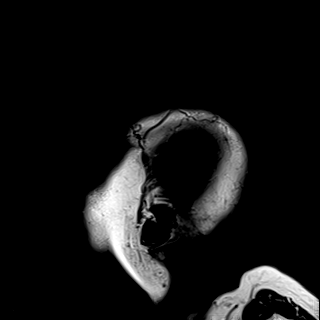
[im 23/23]
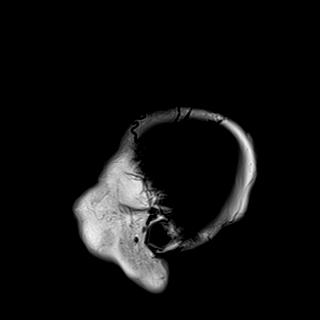

[Series 10: T2 · axial · 5.0mm · 0.72mm/px · z∈[-14,+117]mm · 2 of 25 slices shown (1 of 2)]
[im 1/25]
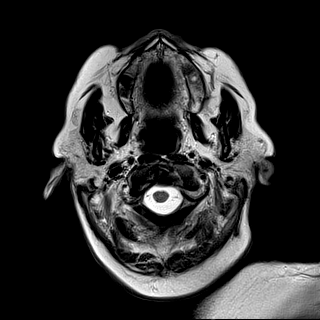
[im 25/25]
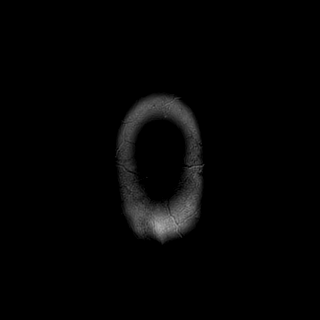

[Series 11: FLAIR · axial · 5.0mm · 0.45mm/px · z∈[-19,+112]mm · 2 of 25 slices shown]
[im 1/25]
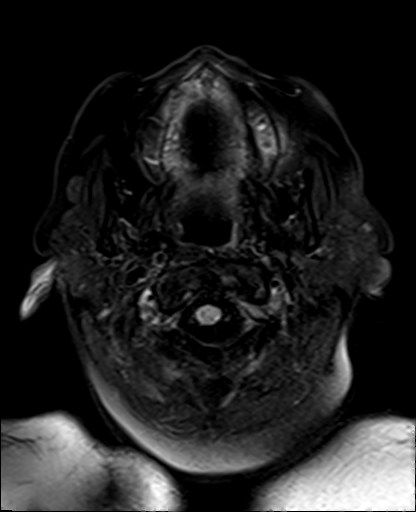
[im 25/25]
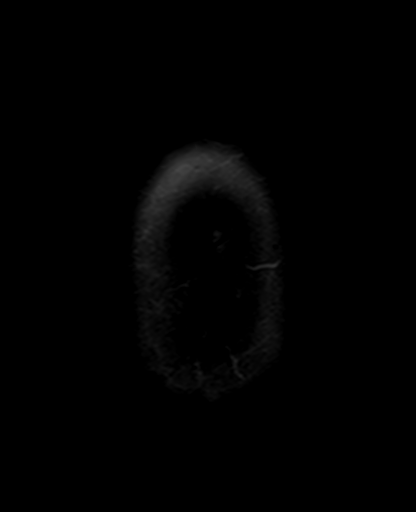

[Series 12: mag_images · axial · 3.0mm · 0.90mm/px · z∈[-34,+127]mm · 4 of 60 slices shown]
[im 1/60]
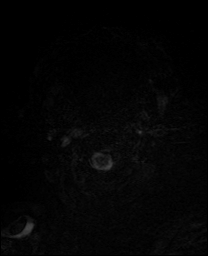
[im 20/60]
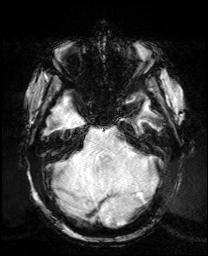
[im 40/60]
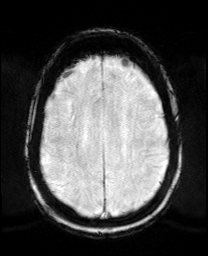
[im 60/60]
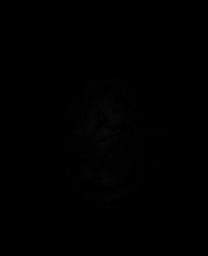

[Series 13: pha_images · axial · 3.0mm · 0.90mm/px · z∈[-34,+127]mm · 4 of 60 slices shown]
[im 1/60]
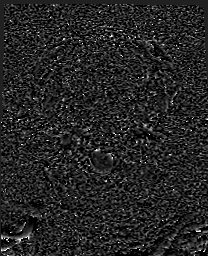
[im 20/60]
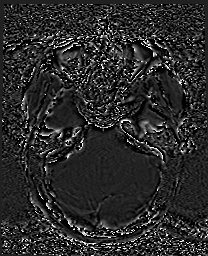
[im 40/60]
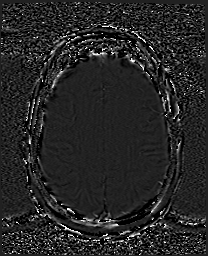
[im 60/60]
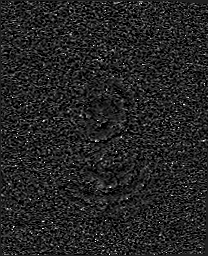

[Series 14: swi_images · axial · 3.0mm · 0.90mm/px · z∈[-34,+127]mm · 4 of 60 slices shown]
[im 1/60]
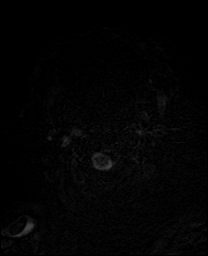
[im 20/60]
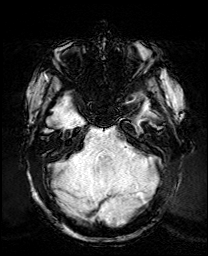
[im 40/60]
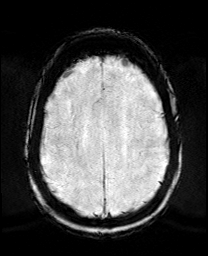
[im 60/60]
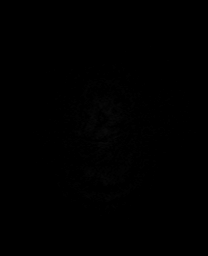

[Series 15: mip_images(sw) · axial · 24.0mm · 0.90mm/px · z∈[-24,+117]mm · 4 of 53 slices shown]
[im 1/53]
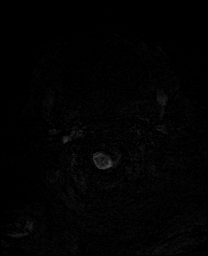
[im 18/53]
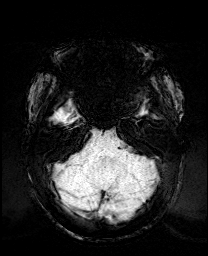
[im 35/53]
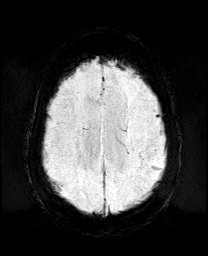
[im 53/53]
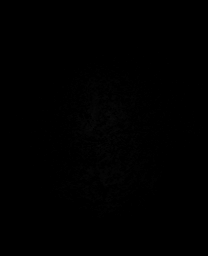

[Series 17: T2 · coronal · 5.0mm · 0.34mm/px · 2 of 29 slices shown (2 of 2)]
[im 1/29]
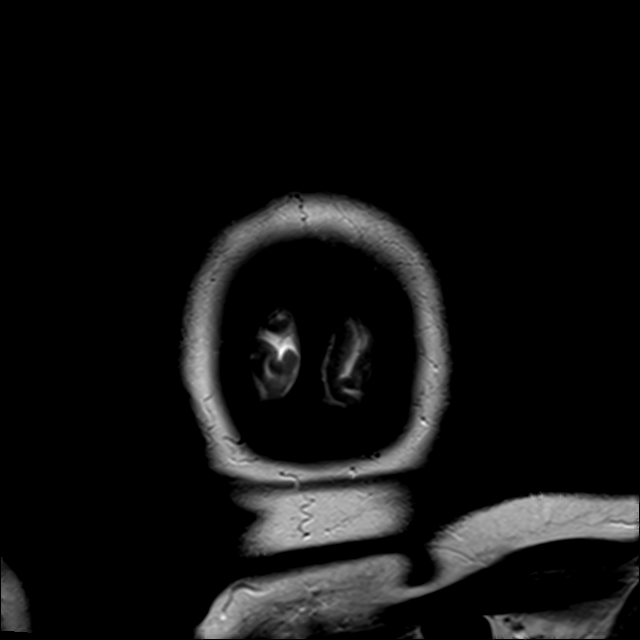
[im 29/29]
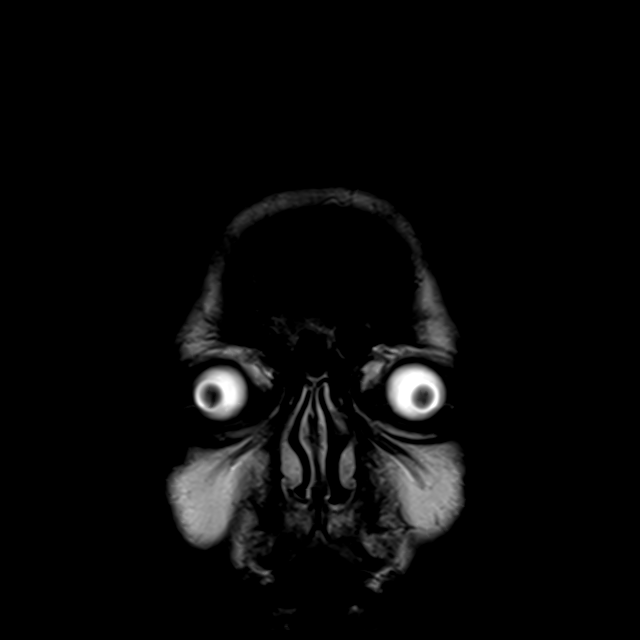

[44 of 48 positions shown; findings below may reference images not displayed]

FINDINGS: Brain: There is no evidence of an acute infarct, intracranial
hemorrhage, mass, midline shift, or extra-axial fluid collection.
There is mild-to-moderate cerebral atrophy. There is confluent T2
hyperintensity along the margins of the occipital and temporal horns
of the lateral ventricles suspicious for mild edema. Patchy T2
hyperintensity in the periatrial regions more resembles chronic
small vessel ischemia. No definite debris is evident in the
ventricles. No sulcal FLAIR signal abnormality is identified.

Vascular: Major intracranial vascular flow voids are preserved.

Skull and upper cervical spine: No suspicious calvarial lesion.
Abnormal intermediate T1 signal material in the midline and left
lateral ventral spinal canal at C2-3, possibly epidural.

Sinuses/Orbits: Paranasal sinuses and mastoid air cells are clear.
Unremarkable orbits.

Other: None.
IMPRESSION: 1. Confluent periventricular T2 hyperintensity, indeterminate. Given
the clinical concern for infection, this could reflect mild edema
from ventriculitis although no ventricular debris is evident and
there are no other findings of meningitis on this unenhanced study.
2. Mild chronic small vessel ischemic disease elsewhere in the
cerebral white matter.
3. No acute infarct.
4. Abnormal material in the included upper cervical spinal canal of
uncertain etiology. Cervical spine MRI is recommended for further
evaluation.

These results will be called to the ordering clinician or
representative by the Radiologist Assistant, and communication
documented in the PACS or [REDACTED].

## 2021-06-15 MED ORDER — SODIUM CHLORIDE 0.9 % IV SOLN: INTRAVENOUS | Status: DC

## 2021-06-15 MED ORDER — LIDOCAINE HCL (PF) 1 % IJ SOLN
INTRAMUSCULAR | Status: AC
  Filled 2021-06-15: qty 5

## 2021-06-15 MED ORDER — LIDOCAINE HCL (PF) 1 % IJ SOLN: 5.0000 mL | Freq: Once | INTRAMUSCULAR | Status: DC | PRN

## 2021-06-15 MED ORDER — HYDROMORPHONE HCL 1 MG/ML IJ SOLN
1.0000 mg | Freq: Once | INTRAMUSCULAR | Status: AC | PRN
  Administered 2021-06-15: 1 mg via INTRAVENOUS
  Filled 2021-06-15: qty 1

## 2021-06-15 MED ORDER — VANCOMYCIN HCL 2000 MG/400ML IV SOLN
2000.0000 mg | Freq: Once | INTRAVENOUS | Status: AC
  Administered 2021-06-15: 2000 mg via INTRAVENOUS
  Filled 2021-06-15: qty 400

## 2021-06-15 MED ORDER — NALOXONE HCL 0.4 MG/ML IJ SOLN
0.4000 mg | INTRAMUSCULAR | Status: DC | PRN
  Filled 2021-06-15: qty 1

## 2021-06-15 MED ORDER — VANCOMYCIN HCL 1250 MG/250ML IV SOLN
1250.0000 mg | INTRAVENOUS | Status: DC
  Administered 2021-06-16 – 2021-06-17 (×2): 1250 mg via INTRAVENOUS
  Filled 2021-06-15 (×2): qty 250

## 2021-06-15 NOTE — Progress Notes (Signed)
Radiology called about MRI being available for review in Epic. Per dayshift RN, bladder scanner indicated 929 ml of urine in bladder. Pt had in and out catheterization completed twice. Was not able to urinate since 06/14/21.  MD notified of above findings. Will continue to monitor pt.

## 2021-06-15 NOTE — Progress Notes (Signed)
Pt off the floor... transported to MRI

## 2021-06-15 NOTE — Progress Notes (Signed)
PROGRESS NOTE  Kristy [name]  BVQ:[id number] DOB: 08-09-1946 DOA: 06/10/2021 PCP: Darrow Bussing, MD   Brief Narrative: Kristy [name] is a 75 y.o. female with a history of alcohol use who presented to the ED for frequent falls, generalized weakness, and confusion. She was found to be afebrile, hypertensive, and without hypoxia. Also had hypokalemia (K 2.6), SCr 1.88 (above baseline ~1.5), LFT elevation (AST 576, ALT 305). Ammonia normal. EtOH, salicylate, tylenol levels negative. CT head and cervical spine were reassuring, though the patient was unable to rise to seated position from laying prompting consultation for admission. The patient was given potassium supplementation, started on CIWA protocol, and admitted with note made of continued AMS. She has remained weak and confused, though on 8/5 became less interactive, retaining urine, WBC noted to be rising. Blood and urine cultures ordered, ceftriaxone given with bacteriuria and urinary retention. MR brain has not been done yet. Neurology consulted with worsening mental status and nuchal rigidity. LP under fluoro requested, empirically treating meningitis/encephalitis.   Assessment & Plan: Principal Problem:   Acute delirium Active Problems:   Fall   Alcoholic hepatitis   Altered mental status   Sepsis (HCC)  Acute metabolic encephalopathy: Multifactorial, likely also a chronic component. CIWA scores have remained low and intake is said to only be a couple drinks daily. - Neurology consulted - awaiting bedside LP attempt per protocol - studies per neurology pending; may require fluoro or IR guidance for LP if neuro is unable to gain access today at bedside. - MRI brain pending - Empiric coverage with vancomycin, ampicillin, ceftriaxone 2g, and acyclovir.  - Remains NPO due to mental status/interaction per speech  UTI:  - WBC rising, has remained afebrile. POA - Check UA (was delayed) > nitrite-positive bacteriuria. Leukocytosis worsening now  with urinary retention.  - Initiated treatment with ceftriaxone, sent urine culture and blood cultures. Will be on CTX for meningitis empiric coverage regardless.  Ambulatory dysfunction with fall at home, severe weakness:  - Secondary to above - CT head, cervical spine reassuring at admission. - PT/OT recommending SNF rehabilitation    Alcoholic hepatitis: Based on AST predominance of LFT elevations. Hepatitis panel nonreactive.  - No indication for prednisolone at this time. Continue trending. - AST/ALT downtrending - bilirubin increased marginally    Alcohol abuse: 2 beers per day of late, negative in ED.  - Continue CIWA. Monitor closely for withdrawal. May DC in AM if no need for ativan. - Cessation counseling to be repeated once pt more clear-headed. - Empirically supplementing thiamine, folate, MVM.   Hypokalemia: - Supplemented with resolution   Hyponatremia: Improved with isotonic saline  HTN: - Continue ARB, and increased norvasc.    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 CK wnl at 138. - SCr improved to putative baseline and now starting to worsen. Give LR bolus and start infusion - Monitor renal function closely with initiation of vancomycin, acyclovir.  Acute urinary retention: Possibly due to UTI. - I/O and serial bladder scan. - After I/O of 1.6L, abdominal exam improved.    HLD: - Hold statin for now.  DVT prophylaxis: Lovenox 30mg  Code Status: Full Family Communication: At bedside Disposition Plan:  Status is: Inpatient  Remains inpatient appropriate because:Persistent severe electrolyte disturbances, Altered mental status, and Unsafe d/c plan  Dispo: The patient is from: Home              Anticipated d/c is to: SNF  Patient currently is not medically stable to d/c.   Difficult to place patient No  Consults: Neurology Psychiatry  Procedures:  None  Antimicrobials: Acyclovir, ampicillin, ceftriaxone,  vancomycin  Subjective: No acute issues or events overnight, patient more somnolent than previously reported; review of systems limited  Objective: Vitals:   06/14/21 2155 06/14/21 2300 06/15/21 0137 06/15/21 0510  BP: 136/84 136/80 136/81 124/65  Pulse: (!) 110 (!) 109 (!) 108 99  Resp: 20 20 20 20   Temp: 98.3 F (36.8 C) 97.6 F (36.4 C) 98.2 F (36.8 C)   TempSrc: Axillary Axillary Oral   SpO2: 96% 96% 96% 96%  Weight:      Height:        Intake/Output Summary (Last 24 hours) at 06/15/2021 0729 Last data filed at 06/15/2021 [id number] Gross per 24 hour  Intake 800 ml  Output 3875 ml  Net -3075 ml    Gen: 75 y.o. female in no distress Pulm: Nonlabored breathing room air. Clear. CV: Regular tachycardia without murmur, or gallop. No JVD or edema. GI: Abdominal exam limited due to pt's withdrawal and yelling with any palpation of any part of her body. She does continue to guard diffusely however.  Ext: Warm, no deformities Skin: No new rashes, lesions or ulcers on visualized skin. Neuro: Asleep but awakes with verbal cues, difficulty following simple commands but does not follow movement or interact very well. Psych: UTD  Data Reviewed: I have personally reviewed following labs and imaging studies  CBC: Recent Labs  Lab 06/10/21 2105 06/11/21 0611 06/14/21 1315  WBC 10.8* 13.9* 25.3*  NEUTROABS 8.4*  --   --   HGB 15.2* 15.8* 13.7  HCT 45.3 46.1* 40.7  MCV 95.4 94.3 96.0  PLT 100* 106* 205    Basic Metabolic Panel: Recent Labs  Lab 06/10/21 2105 06/11/21 0000 06/11/21 0611 06/12/21 0333 06/12/21 1558 06/13/21 0306  NA 128*  --  132* 136 137 136  K 2.6*  --  3.3* 2.9* 3.7 4.3  CL 91*  --  95* 98 102 100  CO2 24  --  25 25 23 22   GLUCOSE 106*  --  86 116* 110* 84  BUN 39*  --  35* 31* 31* 40*  CREATININE 1.88*  --  1.66* 1.46* 1.51* 1.62*  CALCIUM 8.8*  --  8.9 9.2 9.4 9.8  MG  --  1.9  --   --   --  2.1    GFR: Estimated Creatinine Clearance: 33.1 mL/min  (A) (by C-G formula based on SCr of 1.62 mg/dL (H)). Liver Function Tests: Recent Labs  Lab 06/10/21 2105 06/11/21 0611 06/12/21 0333 06/14/21 2013  AST 576* 425* 138* 120*  ALT 305* 315* 199* 136*  ALKPHOS 101 117 92 104  BILITOT 3.0* 3.0* 3.3* 6.5*  PROT 6.8 6.9 6.4* 6.9  ALBUMIN 2.1* 2.0* 1.8* 1.6*    No results for input(s): LIPASE, AMYLASE in the last 168 hours. Recent Labs  Lab 06/11/21 0000  AMMONIA 25    Coagulation Profile: Recent Labs  Lab 06/10/21 2105  INR 1.2    Cardiac Enzymes: Recent Labs  Lab 06/10/21 2105  CKTOTAL 138    BNP (last 3 results) No results for input(s): PROBNP in the last 8760 hours. HbA1C: No results for input(s): HGBA1C in the last 72 hours. CBG: No results for input(s): GLUCAP in the last 168 hours. Lipid Profile: No results for input(s): CHOL, HDL, LDLCALC, TRIG, CHOLHDL, LDLDIRECT in the last 72 hours. Thyroid  Function Tests: Recent Labs    06/14/21 0303  TSH 3.885    Anemia Panel: Recent Labs    06/14/21 0303 06/14/21 2013  VITAMINB12 674  --   FOLATE  --  16.0    Urine analysis:    Component Value Date/Time   COLORURINE AMBER (A) 06/14/2021 1338   APPEARANCEUR HAZY (A) 06/14/2021 1338   LABSPEC 1.011 06/14/2021 1338   PHURINE 6.0 06/14/2021 1338   GLUCOSEU NEGATIVE 06/14/2021 1338   HGBUR LARGE (A) 06/14/2021 1338   BILIRUBINUR NEGATIVE 06/14/2021 1338   KETONESUR NEGATIVE 06/14/2021 1338   PROTEINUR NEGATIVE 06/14/2021 1338   UROBILINOGEN 0.2 05/14/2008 0020   NITRITE NEGATIVE 06/14/2021 1338   LEUKOCYTESUR NEGATIVE 06/14/2021 1338   Recent Results (from the past 240 hour(s))  Resp Panel by RT-PCR (Flu A&B, Covid) Nasopharyngeal Swab     Status: None   Collection Time: 06/11/21  3:13 AM   Specimen: Nasopharyngeal Swab; Nasopharyngeal(NP) swabs in vial transport medium  Result Value Ref Range Status   SARS Coronavirus 2 by RT PCR NEGATIVE NEGATIVE Final    Comment: (NOTE) SARS-CoV-2 target nucleic  acids are NOT DETECTED.  The SARS-CoV-2 RNA is generally detectable in upper respiratory specimens during the acute phase of infection. The lowest concentration of SARS-CoV-2 viral copies this assay can detect is 138 copies/mL. A negative result does not preclude SARS-Cov-2 infection and should not be used as the sole basis for treatment or other patient management decisions. A negative result may occur with  improper specimen collection/handling, submission of specimen other than nasopharyngeal swab, presence of viral mutation(s) within the areas targeted by this assay, and inadequate number of viral copies(<138 copies/mL). A negative result must be combined with clinical observations, patient history, and epidemiological information. The expected result is Negative.  Fact Sheet for Patients:  BloggerCourse.com  Fact Sheet for Healthcare Providers:  SeriousBroker.it  This test is no t yet approved or cleared by the [place] FDA and  has been authorized for detection and/or diagnosis of SARS-CoV-2 by FDA under an Emergency Use Authorization (EUA). This EUA will remain  in effect (meaning this test can be used) for the duration of the COVID-19 declaration under Section 564(b)(1) of the Act, 21 U.S.C.section 360bbb-3(b)(1), unless the authorization is terminated  or revoked sooner.       Influenza A by PCR NEGATIVE NEGATIVE Final   Influenza B by PCR NEGATIVE NEGATIVE Final    Comment: (NOTE) The Xpert Xpress SARS-CoV-2/FLU/RSV plus assay is intended as an aid in the diagnosis of influenza from Nasopharyngeal swab specimens and should not be used as a sole basis for treatment. Nasal washings and aspirates are unacceptable for Xpert Xpress SARS-CoV-2/FLU/RSV testing.  Fact Sheet for Patients: BloggerCourse.com  Fact Sheet for Healthcare Providers: SeriousBroker.it  This  test is not yet approved or cleared by the [place] FDA and has been authorized for detection and/or diagnosis of SARS-CoV-2 by FDA under an Emergency Use Authorization (EUA). This EUA will remain in effect (meaning this test can be used) for the duration of the COVID-19 declaration under Section 564(b)(1) of the Act, 21 U.S.C. section 360bbb-3(b)(1), unless the authorization is terminated or revoked.  Performed at [facility], [address] [place], [place] [address]       Radiology Studies: CT HEAD WO CONTRAST ( )  Result Date: 06/14/2021 CLINICAL DATA:  Mental status changes. EXAM: CT HEAD WITHOUT CONTRAST TECHNIQUE: Contiguous axial images were obtained from the base of the skull through  the vertex without intravenous contrast. COMPARISON:  Head CT 06/2021 FINDINGS: Brain: Stable age related cerebral atrophy, ventriculomegaly and periventricular white matter disease. No extra-axial fluid collections are identified. No CT findings for acute hemispheric infarction or intracranial hemorrhage. No mass lesions. The brainstem and cerebellum are normal. Vascular: Stable vascular calcifications.  No hyperdense vessels. Skull: No skull fracture or bone lesions. Sinuses/Orbits: The paranasal sinuses and mastoid air cells are clear. The globes are intact. Other: No scalp lesions or scalp hematoma. IMPRESSION: 1. Stable age related cerebral atrophy, ventriculomegaly and periventricular white matter disease. 2. No acute intracranial findings or mass lesions. Electronically Signed   By: Rudie Meyer M.D.   On: 06/14/2021 17:22   DG CHEST PORT 1 VIEW  Result Date: 06/14/2021 CLINICAL DATA:  Leukocytosis EXAM: PORTABLE CHEST 1 VIEW COMPARISON:  06/11/2020, 05/14/2008 FINDINGS: The heart size and mediastinal contours are within normal limits. Both lungs are clear. The visualized skeletal structures are unremarkable. IMPRESSION: No active disease. Electronically Signed   By: Jasmine Pang M.D.    On: 06/14/2021 18:30    Scheduled Meds:  allopurinol  300 mg Oral Daily   amLODipine  10 mg Oral Daily   aspirin  81 mg Oral Daily   enoxaparin (LOVENOX) injection  30 mg Subcutaneous Daily   folic acid  1 mg Oral Daily   irbesartan  150 mg Oral Daily   multivitamin with minerals  1 tablet Oral Daily   [START ON 06/22/2021] thiamine injection  100 mg Intravenous Daily   Continuous Infusions:  acyclovir (ZOVIRAX) [phone] mg IVPB 595 mg (06/14/21 1849)   ampicillin (OMNIPEN) IV 2 g (06/15/21 0414)   cefTRIAXone (ROCEPHIN)  IV     dextrose 5% lactated ringers     thiamine injection 500 mg (06/15/21 0092)   Followed by   Melene Muller ON 06/16/2021] thiamine injection     vancomycin     vancomycin       LOS: 4 days   Time spent: 35 minutes.  Azucena Fallen, DO [facility] Pager: Secure chat  06/15/2021, 7:29 AM

## 2021-06-15 NOTE — Progress Notes (Signed)
Neurology Progress Note  Patient ID: Kristy [name] is a 75 y.o. with PMHx of  alcohol abuse.   Initially consulted for: change in mental status.    Major interval events:  06/14/21: change in mental status, nuchal rigidity, fever and white count increase from 10.8 on admission in 06/10/21 to 25.3 on 06/14/21.  Subjective: Moans and groans only. No intelligible words appreciated.   Exam:last temp recorded 06/15/21 at 0137 98.2F Vitals:   06/15/21 0510 06/15/21 0823  BP: 124/65 (!) 140/100  Pulse: 99 (!) 117  Resp: 20 (!) 30  Temp:    SpO2: 96% 95%   Gen: In bed, neck fixed in position  bent towards left ear. Pain with attempts to manipulate neck at all.  Resp: non-labored breathing, no grossly audible wheezing Cardiac: Perfusing extremities well  Abd: soft, nt  Neuro: MS: eyes are closed mostly but opens transiently when name is called.  Mostly moans throughout exam, with mumbled words. Does not attempt to follow commands. Unable to assess orientation given status.  TF:[id number] to assess. Face is symmetric. Unable to cooperate to rest of exam Motor: normal bulk. Weakly withdraws both arms and legs to noxious stimuli. Sensory:appears intact to noxious stimuli.     Pertinent Labs:  Results for Kristy [name] (MRN [id number]) as of 06/15/2021 11:32  Ref. Range 06/14/2021 13:15  WBC Latest Ref Range: 4.0 - 10.5 K/uL 25.3 (H)  RBC Latest Ref Range: 3.87 - 5.11 MIL/uL 4.24  Hemoglobin Latest Ref Range: 12.0 - 15.0 g/dL 83.1  HCT Latest Ref Range: 36.0 - 46.0 % 40.7  MCV Latest Ref Range: 80.0 - 100.0 fL 96.0  MCH Latest Ref Range: 26.0 - 34.0 pg 32.3  MCHC Latest Ref Range: 30.0 - 36.0 g/dL 51.7  RDW Latest Ref Range: 11.5 - 15.5 % 13.5  Platelets Latest Ref Range: 150 - 400 K/uL 205  Results for Kristy [name] (MRN [id number]) as of 06/15/2021 11:32  Ref. Range 06/14/2021 20:13  Folate Latest Ref Range: >5.9 ng/mL 16.0    Impression:  Kristy [name] is a 75 year old female w/pmh of  alcohol  use who is admitted 2/2 frequent falls, generalized weakness, and confusion and today developed acute onset nuchal rigidity, change in mental status, fever and an increase in WBC from 13.9 to 25.3.   Her neurological examination is pertinent for nuchal rigidity- she is holding her neck stiffly and when the examiner attempts to move it, she is clearly in pain.   Encephalopathy labs with normal B12, TSH, no elevated Ammonia.   Considering her increase in white count + fever need to rule out meningitis.  On acyclovir 595mg  twice daily, ampicillin 2gm every 4 hours, rocephin 2gm every 12 hours, vancomycin 1.25gm daily.     Recommendations: - Continue with empiric antibiotic/ antiviral treatment outlined above.  - will attempt LP at bedside but with the extent of encephalopathy and pain response, would recommend fluoro guided LP with sedation assistance. - CSF cell count, differential, protein, glucose, csf gram stain and CSF cultures, HSV 1/2 PCR and Cryptococcus Ag, Strep Pneumo urinary antigen. - MRI Brain without contrast, will hold off on contrast 2/2 kidney dysfunction. - Thiamine Wernicke's dose with 500 IV TID x 2 days, then 250mg  daily x 5 days, then 100mg  daily. Can transition to PO 100mg  daily at discharge. - will continue to follow and make further recommendations as needed.

## 2021-06-15 NOTE — Progress Notes (Signed)
D/c instructions reviewed w/daughter [name] and pt.... daughter verb understanding.... questions answered

## 2021-06-15 NOTE — Procedures (Signed)
Indication:  Risks of the procedure were dicussed with the patient's daughter Nadya Hopwood including post-LP headache, bleeding, infection, weakness/numbness of legs(radiculopathy).  The patient/patient's proxy agreed and written consent was obtained.  The patient was given 1mg  of Dilaudid for pain and Narcan was kept at bedside in case reversal was needed.  The patient was prepped and draped, and using sterile technique a 20 gauge quinke spinal needle was inserted in the L3-4 and another attempt was made in L4-5 space. Despite 2 separate attempts, I was unable to get an CSF.  Will need LP under Fluoro. Discussed with Dr. .  Natale Milch [facility] Pager Number Erick Blinks

## 2021-06-15 NOTE — Progress Notes (Signed)
X2 LP attempts were made w/no success by MD... MD will place order for IR

## 2021-06-15 NOTE — Progress Notes (Signed)
1825 - Pt back on the floor... Vanc restarted.

## 2021-06-15 NOTE — Progress Notes (Signed)
X-ray called and reports they are unable to perform the LP procedure d/t order wanting pt to be sedated before  Also, if MD wants LP, MD at bedside has to attempt LP x2 before sending down to [facility]  If MD wants pt under sedation, pt must be referred to IR for procedure  Notified Dr. Ezzie Dural... VO given for Lidocaine and LP tray... Materials at bedside.

## 2021-06-15 NOTE — Progress Notes (Signed)
MRI reviewed. Neuro notified. Patient already has plan for LP. Will continue to monitor.

## 2021-06-15 NOTE — Evaluation (Signed)
Clinical/Bedside Swallow Evaluation Patient Details  Name: Kristy [name] MRN: [id number] Date of Birth: 02-09-1946  Today's Date: 06/15/2021 Time: SLP Start Time (ACUTE ONLY): 0919 SLP Stop Time (ACUTE ONLY): 1287 SLP Time Calculation (min) (ACUTE ONLY): 19 min  Past Medical History: No past medical history on file. Past Surgical History: No past surgical history on file. HPI:  Pt is a 75yo female presenting to [facility] ED after a fall on 7/29. Pt also fell on 8/2 while in the ED. Pt found to be hypokalemic with generalized weakness, and confusion. 06/14/21 pt developed acute onset nuchal rigidity, change in mental status, fever and an increase in WBC from 13.9 to 25.3. CT head (06/14/21) showed no acute findings. Chest x-ray (06/14/21) revealed no active disease. PMH: HTN, polycthemia, alcohol use.   Assessment / Plan / Recommendation Clinical Impression  Mentation and lethargy primarily impacts pt's ability to safely consume POs at this time. She presented this am with copious secretions and white lingual coating with attempted clearance by clinician via toothbrush and oral suction. Pt grimaced as if she was in pain on several occasions and RN was notiifed. Trials of ice chips were poorly manipulated and sometimes not at all, resulting in suspected premature spillage and delay in swallow initation with pt exhibiting overt coughing across all trials. She demonstrated worsening bolus awareness with spoonfuls of puree, with no oral manipulation or swallow triggered requiring manual removal with oral suction. Pt is not safe for PO intake at this time. Recommend NPO with QID oral care. Temporary alternative means of nutrition may be considered if mentation does not improve. Will continue discussion with care team as it pertains to GOC. SLP to f/u for PO readiness.  SLP Visit Diagnosis: Dysphagia, unspecified (R13.10)    Aspiration Risk  Severe aspiration risk    Diet Recommendation NPO;Alternative means -  temporary   Medication Administration: Via alternative means    Other  Recommendations Oral Care Recommendations: Oral care QID;Staff/trained caregiver to provide oral care Other Recommendations: Have oral suction available   Follow up Recommendations Skilled Nursing facility;24 hour supervision/assistance      Frequency and Duration min 2x/week  2 weeks       Prognosis Prognosis for Safe Diet Advancement: Fair Barriers to Reach Goals: Cognitive deficits      Swallow Study   General Date of Onset: 06/10/21 HPI: Pt is a 75yo female presenting to [facility] ED after a fall on 7/29. Pt also fell on 8/2 while in the ED. Pt found to be hypokalemic with generalized weakness, and confusion. 06/14/21 pt developed acute onset nuchal rigidity, change in mental status, fever and an increase in WBC from 13.9 to 25.3. CT head (06/14/21) showed no acute findings. Chest x-ray (06/14/21) revealed no active disease. PMH: HTN, polycthemia, alcohol use. Type of Study: Bedside Swallow Evaluation Previous Swallow Assessment: none noted in EMR Diet Prior to this Study: NPO Temperature Spikes Noted: Yes Respiratory Status: Room air History of Recent Intubation: No Behavior/Cognition: Lethargic/Drowsy;Cooperative Oral Cavity Assessment: Excessive secretions;Dried secretions Oral Care Completed by SLP: Yes Oral Cavity - Dentition: Edentulous;Other (Comment) (lower dentition) Vision:  (Unable to fully assess) Self-Feeding Abilities: Total assist Patient Positioning: Upright in bed Baseline Vocal Quality: Low vocal intensity Volitional Cough: Weak    Oral/Motor/Sensory Function Overall Oral Motor/Sensory Function: Other (comment) (unable to fully assess)   Ice Chips Ice chips: Impaired Presentation: Spoon Oral Phase Impairments: Poor awareness of bolus;Reduced lingual movement/coordination Oral Phase Functional Implications: Prolonged oral transit;Oral holding Pharyngeal Phase  Impairments: Cough -  Immediate;Suspected delayed Swallow;Unable to trigger swallow   Thin Liquid Thin Liquid: Not tested    Nectar Thick Nectar Thick Liquid: Not tested   Honey Thick Honey Thick Liquid: Not tested   Puree Puree: Impaired Presentation: Spoon Oral Phase Impairments: Reduced lingual movement/coordination;Poor awareness of bolus Oral Phase Functional Implications: Oral holding;Oral residue   Solid     Solid: Not tested     Avie Echevaria, MA, CCC-SLP Acute Rehabilitation Services Office Number: [phone]  Paulette Blanch 06/15/2021,10:24 AM

## 2021-06-15 NOTE — TOC Progression Note (Addendum)
Transition of Care [facility]) - Progression Note    Patient Details  Name: Kristy [name] MRN: [id number] Date of Birth: 1946/07/29  Transition of [facility] CM/SW Contact  Kristy [name], Kristy [name] Phone Number: 06/15/2021, 1:12 PM  Clinical Narrative:    CSW spoke with several facilities, requesting pt's referral be reviewed. [facility] is interested, but will make a final decision on Monday. CSW attempted to contact dtr, but was unable to leave a VM. TOC will continue to follow.  3:30 CSW received a call back from dtr Kristy [name]. She stated she and family are still deciding if they can take pt home or if they will need SNF. She is reviewing [facility] in case they offer a bed. SW will continue to follow for DC needs.  Expected Discharge Plan: Skilled Nursing Facility Barriers to Discharge: Continued Medical Work up, SNF Pending bed offer  Expected Discharge Plan and Services Expected Discharge Plan: Skilled Nursing Facility     Post Acute Care Choice: Skilled Nursing Facility Living arrangements for the past 2 months: Single Family Home                                       Social Determinants of Health (SDOH) Interventions    Readmission Risk Interventions No flowsheet data found.

## 2021-06-16 ENCOUNTER — Inpatient Hospital Stay (HOSPITAL_COMMUNITY): Payer: Medicare Other

## 2021-06-16 ENCOUNTER — Inpatient Hospital Stay (HOSPITAL_COMMUNITY): Payer: Medicare Other | Admitting: Certified Registered Nurse Anesthetist

## 2021-06-16 ENCOUNTER — Encounter (HOSPITAL_COMMUNITY)
Admission: EM | Disposition: A | Discharge status: Skilled Nursing Facility | Payer: Self-pay | Source: Home / Self Care | Attending: Internal Medicine

## 2021-06-16 ENCOUNTER — Encounter (HOSPITAL_COMMUNITY): Payer: Self-pay | Admitting: Family Medicine

## 2021-06-16 DIAGNOSIS — G061 Intraspinal abscess and granuloma: Secondary | ICD-10-CM | POA: Diagnosis not present

## 2021-06-16 DIAGNOSIS — R410 Disorientation, unspecified: Secondary | ICD-10-CM

## 2021-06-16 DIAGNOSIS — G9341 Metabolic encephalopathy: Secondary | ICD-10-CM | POA: Diagnosis not present

## 2021-06-16 DIAGNOSIS — Z20822 Contact with and (suspected) exposure to covid-19: Secondary | ICD-10-CM | POA: Diagnosis not present

## 2021-06-16 DIAGNOSIS — Z23 Encounter for immunization: Secondary | ICD-10-CM | POA: Diagnosis not present

## 2021-06-16 HISTORY — PX: ANTERIOR CERVICAL DECOMP/DISCECTOMY FUSION: SHX1161

## 2021-06-16 LAB — BASIC METABOLIC PANEL
Anion gap: 15 (ref 5–15)
BUN: 80 mg/dL — ABNORMAL HIGH (ref 8–23)
CO2: 19 mmol/L — ABNORMAL LOW (ref 22–32)
Calcium: 9.1 mg/dL (ref 8.9–10.3)
Chloride: 120 mmol/L — ABNORMAL HIGH (ref 98–111)
Creatinine, Ser: 2.44 mg/dL — ABNORMAL HIGH (ref 0.44–1.00)
GFR, Estimated: 20 mL/min — ABNORMAL LOW (ref >60)
Glucose, Bld: 86 mg/dL (ref 70–99)
Potassium: 3.2 mmol/L — ABNORMAL LOW (ref 3.5–5.1)
Sodium: 154 mmol/L — ABNORMAL HIGH (ref 135–145)

## 2021-06-16 LAB — GLUCOSE, CAPILLARY
Glucose-Capillary: 76 mg/dL (ref 70–99)
Glucose-Capillary: 78 mg/dL (ref 70–99)
Glucose-Capillary: 82 mg/dL (ref 70–99)
Glucose-Capillary: 89 mg/dL (ref 70–99)
Glucose-Capillary: 92 mg/dL (ref 70–99)

## 2021-06-16 LAB — CBC
HCT: 35.6 % — ABNORMAL LOW (ref 36.0–46.0)
Hemoglobin: 11.8 g/dL — ABNORMAL LOW (ref 12.0–15.0)
MCH: 31.7 pg (ref 26.0–34.0)
MCHC: 33.1 g/dL (ref 30.0–36.0)
MCV: 95.7 fL (ref 80.0–100.0)
Platelets: 176 10*3/uL (ref 150–400)
RBC: 3.72 MIL/uL — ABNORMAL LOW (ref 3.87–5.11)
RDW: 14 % (ref 11.5–15.5)
WBC: 21 10*3/uL — ABNORMAL HIGH (ref 4.0–10.5)
nRBC: 0 % (ref 0.0–0.2)

## 2021-06-16 LAB — URINE CULTURE: Culture: >=100000 — AB

## 2021-06-16 LAB — SURGICAL PCR SCREEN
MRSA, PCR: NEGATIVE
Staphylococcus aureus: POSITIVE — AB

## 2021-06-16 IMAGING — RF DG C-ARM 1-60 MIN
1 series · 1 of 1 positions shown · non-contrast
Comparison: MRI [DATE]

CLINICAL DATA: Neck pain

EXAM:
DG CERVICAL SPINE - 1 VIEW; DG C-ARM 1-60 MIN

[Series 1: run · 1 of 1 slices shown]
[im 1/1]
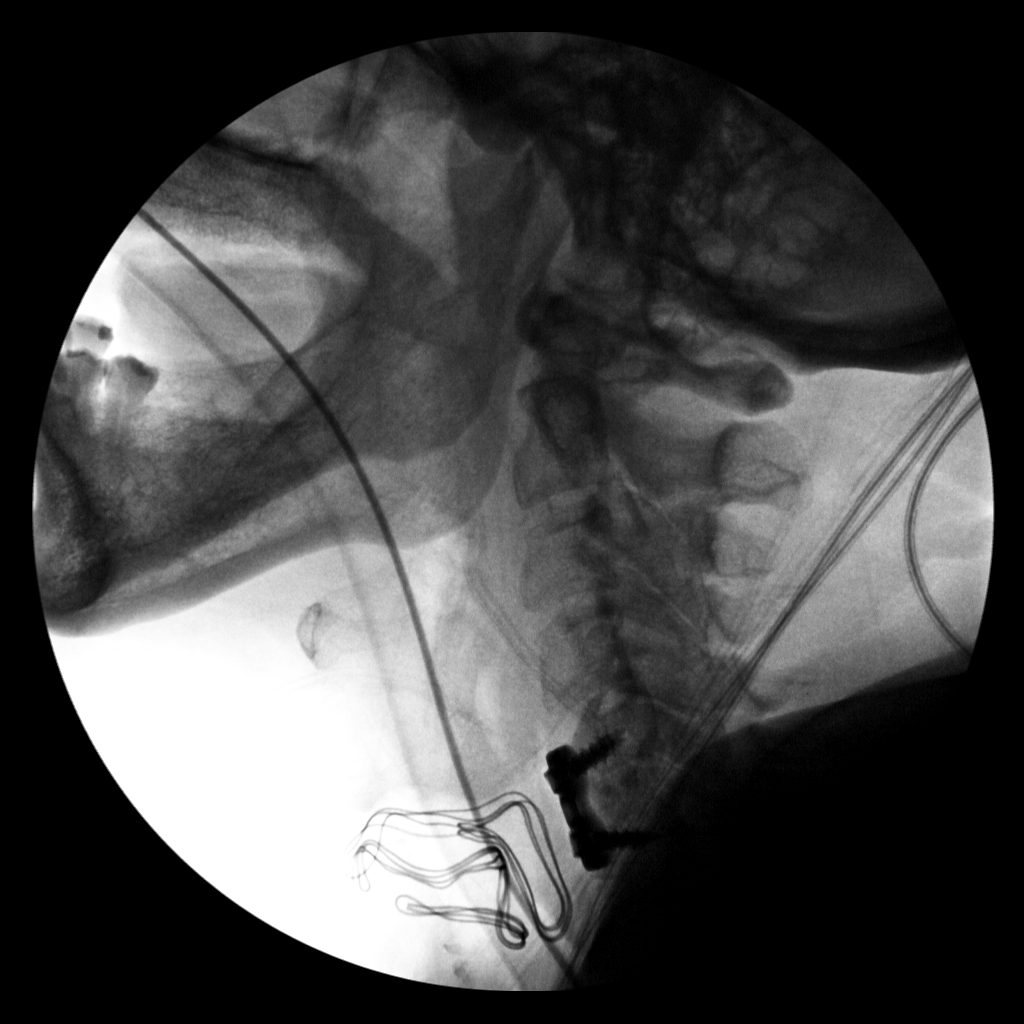

[1 of 1 positions shown; findings below may reference images not displayed]

FINDINGS: Single view intraoperative radiograph of the cervical spine
demonstrates cervical spine from the occiput foot through C5-6. C5-6
anterior cervical discectomy and fusion with instrumentation has
been performed. Normal alignment. NHAYRA is seen within the soft
tissues anterior to the fusion. Endotracheal tube is in place.

Fluoroscopy time: 3 seconds

Dose: 0.25 mGy

Images: 1
IMPRESSION: ACDF C5-6.  NHAYRA in place within the anterior soft tissues.

## 2021-06-16 IMAGING — MR MR CERVICAL SPINE W/O CM
8 series · 42 of 48 positions shown · non-contrast
Comparison: Cervical spine CT from admission

CLINICAL DATA: Cervical radiculopathy. Infection suspected.
Elevated CRP and leukocytosis.

EXAM:
MRI CERVICAL, THORACIC AND LUMBAR SPINE WITHOUT CONTRAST
TECHNIQUE: Multiplanar and multiecho pulse sequences of the cervical spine, to
include the craniocervical junction and cervicothoracic junction,
and thoracic and lumbar spine, were obtained without intravenous
contrast.

[Series 14: T1 · sagittal · 3.0mm · 0.69mm/px · 5 of 15 slices shown (1 of 2)]
[im 1/15]
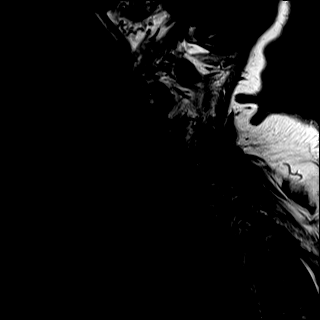
[im 4/15]
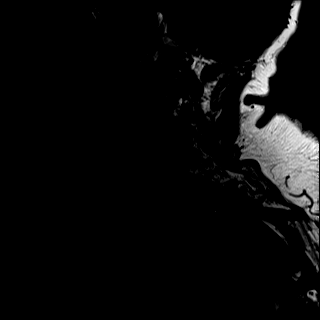
[im 8/15]
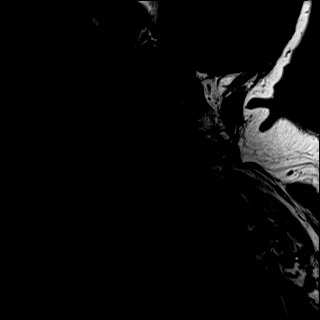
[im 11/15]
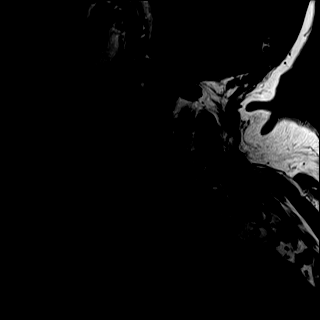
[im 15/15]
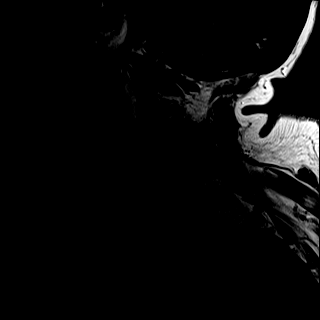

[Series 15: T2 · oblique · 3.0mm · 0.86mm/px · 5 of 15 slices shown (1 of 3)]
[im 1/15]
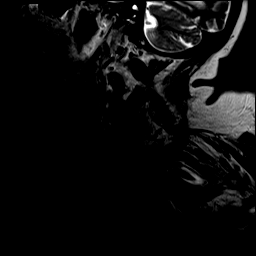
[im 4/15]
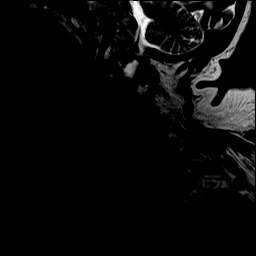
[im 8/15]
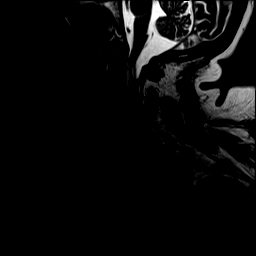
[im 11/15]
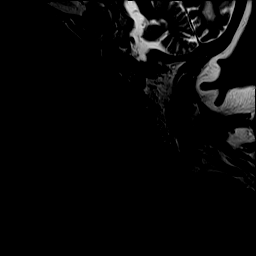
[im 15/15]
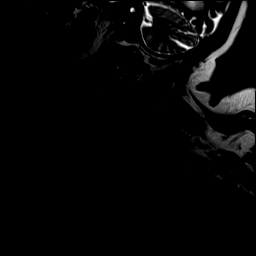

[Series 16: STIR · oblique · 3.0mm · 0.86mm/px · 4 of 15 slices shown (1 of 2)]
[im 1/15]
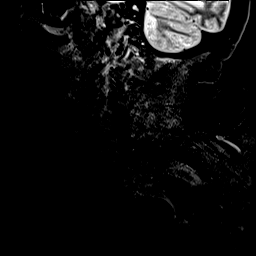
[im 5/15]
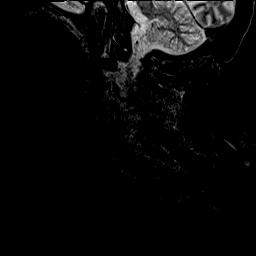
[im 10/15]
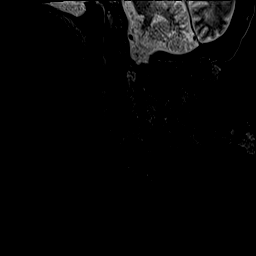
[im 15/15]
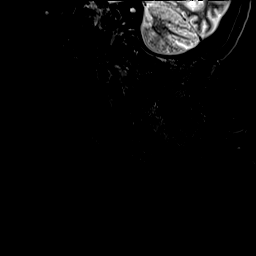

[Series 17: T2 · axial · 3.0mm · 0.78mm/px · z∈[-170,-48]mm · 11 of 40 slices shown (2 of 3)]
[im 1/40]
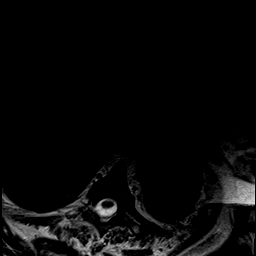
[im 4/40]
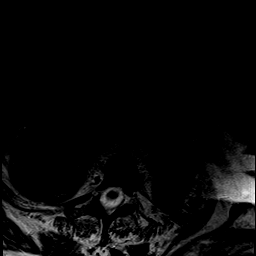
[im 8/40]
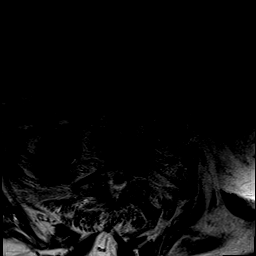
[im 12/40]
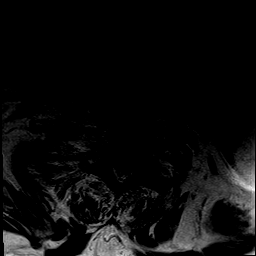
[im 16/40]
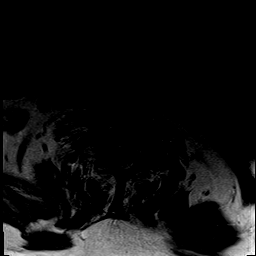
[im 20/40]
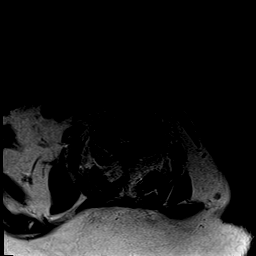
[im 24/40]
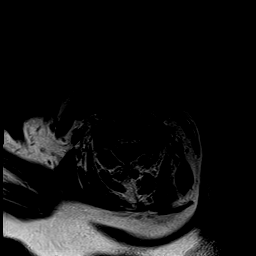
[im 28/40]
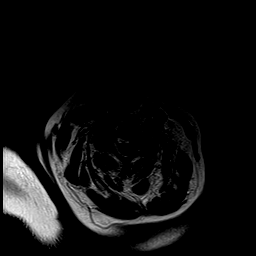
[im 32/40]
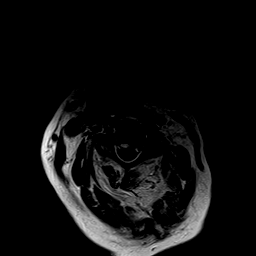
[im 36/40]
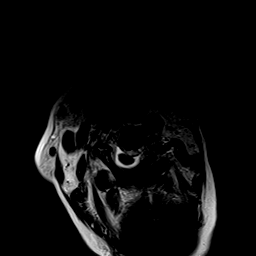
[im 40/40]
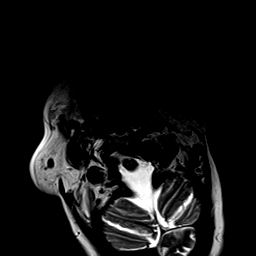

[Series 18: GRE · axial · 3.0mm · 0.39mm/px · z∈[-170,-98]mm · 5 of 40 slices shown]
[im 1/40]
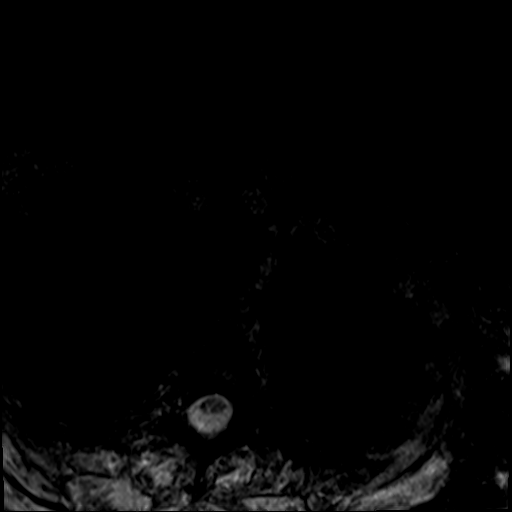
[im 8/40]
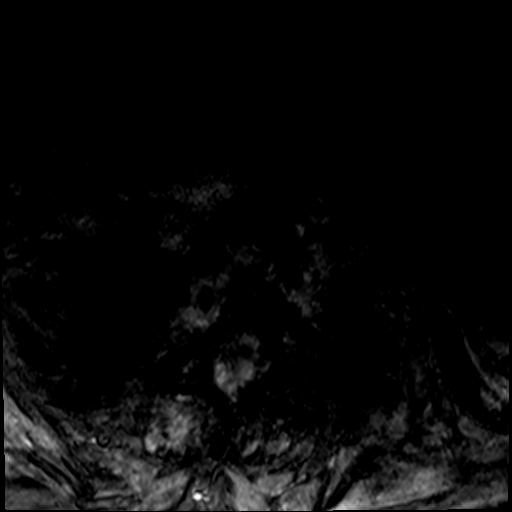
[im 12/40]
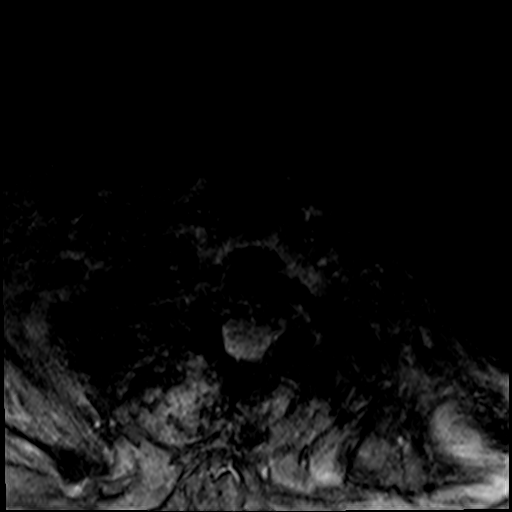
[im 16/40]
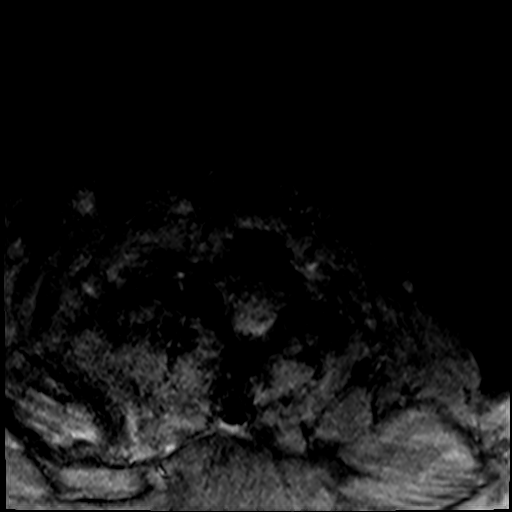
[im 24/40]
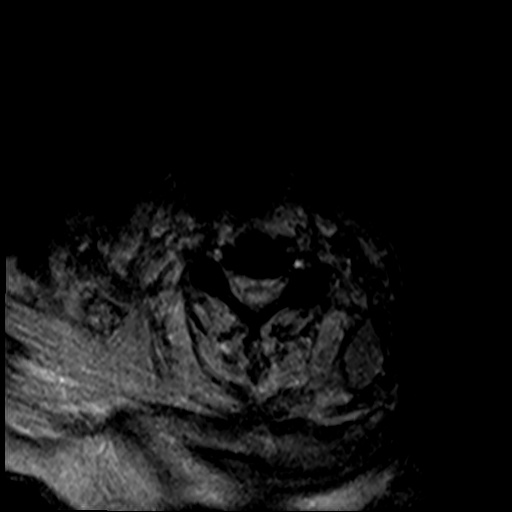

[Series 19: STIR · sagittal · 3.0mm · 0.86mm/px · 4 of 15 slices shown (2 of 2)]
[im 1/15]
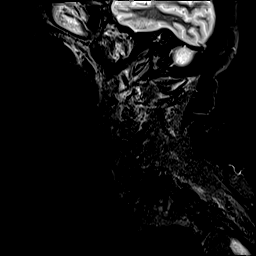
[im 5/15]
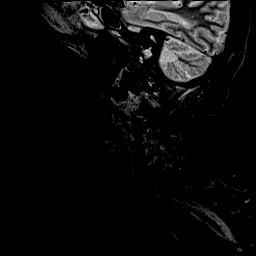
[im 10/15]
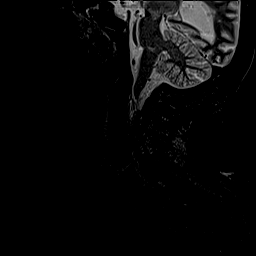
[im 15/15]
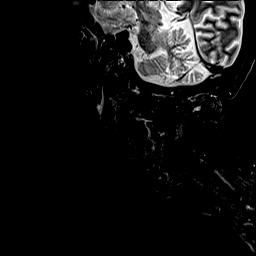

[Series 23: T1 · sagittal · 3.0mm · 0.69mm/px · 4 of 15 slices shown (2 of 2)]
[im 1/15]
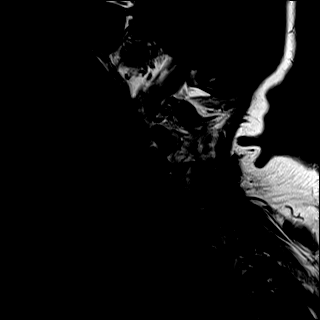
[im 5/15]
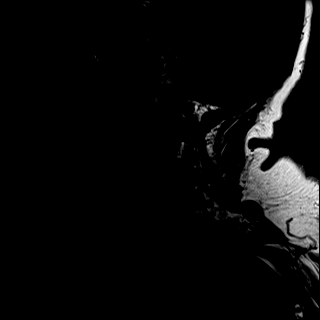
[im 10/15]
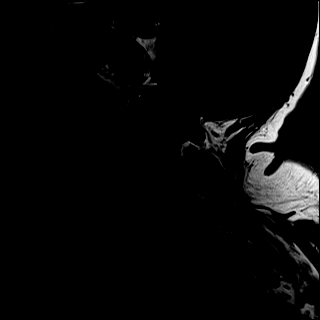
[im 15/15]
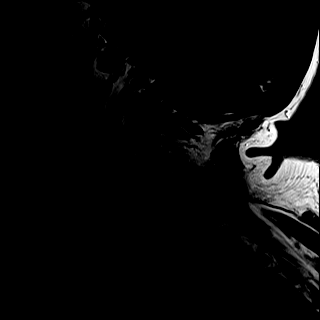

[Series 24: T2 · sagittal · 3.0mm · 0.86mm/px · 4 of 15 slices shown (3 of 3)]
[im 1/15]
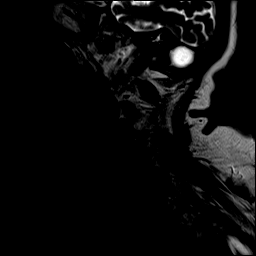
[im 5/15]
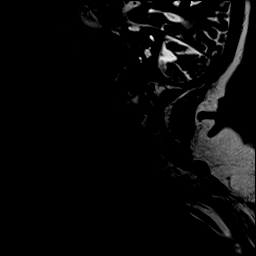
[im 10/15]
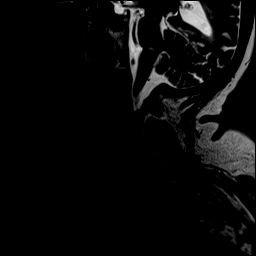
[im 15/15]
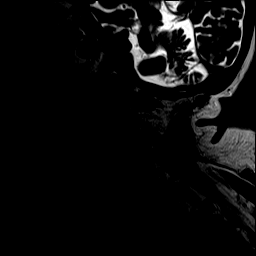

[42 of 48 positions shown; findings below may reference images not displayed]

FINDINGS: MRI CERVICAL SPINE FINDINGS

Alignment: Exaggerated cervical lordosis with mild C7-T1
anterolisthesis.

Vertebrae: Subtle marrow edema at the C5-C7 vertebral levels. There
is disc T2 hyperintensity at C5-6 and C6-7 with extensive
prevertebral edema and posterior-lateral paravertebral edema.

Cord: Multilevel deformity from epidural collection extending from
approximately C2-T4, more inferiorly extensive posteriorly. No
detected cord edema

Posterior Fossa, vertebral arteries, paraspinal tissues: As above

Disc levels:

Given patient motion, evaluation of degenerative disease is limited.
There is diffuse degenerative facet spurring. Milder disc
degeneration with generalized narrowing and bulging.

MRI THORACIC SPINE FINDINGS

Alignment:  Exaggerated thoracic kyphosis.

Vertebrae: T1-2 disc and adjacent endplate hyperintensity.

Cord: Normal signal and morphology. Epidural collection continues
from the cervical levels into the upper thoracic spine, posteriorly
terminating at approximately T3-4.

Paraspinal and other soft tissues: Paravertebral edema at the upper
thoracic levels related to cervical spine disease.

Disc levels:

Generalize bridging osteophytes and ankylosis. Upper thoracic
degenerative facet spurring. No canal or foraminal impingement

MRI LUMBAR SPINE FINDINGS

Segmentation:  5 lumbar type vertebrae

Alignment:  Mild degenerative anterolisthesis at L4-5

Vertebrae: No visible fracture, discitis, or aggressive bone lesion.

Conus medullaris and cauda equina: Conus extends to the T12-L1
level. Conus and cauda equina appear normal.

Paraspinal and other soft tissues: Cystic appearing spaces in the
presacral space on the right intimately associated with piriformis.
The sacroiliac joints are largely on covered. No visible adjacent
marrow edema

Disc levels:

T12- L1: Disc narrowing and bulging.  Mild facet spurring

L1-L2: Disc narrowing and bulging.  Mild facet spurring

L2-L3: Disc narrowing and bulging with degenerative facet spurring.
Left subarticular recess stenosis that could affect the L3 nerve
root

L3-L4: Disc narrowing and bulging with degenerative facet spurring

L4-L5: Facet osteoarthritis with spurring and anterolisthesis. The
disc is narrowed and bulging. Moderate spinal stenosis.

L5-S1:Facet osteoarthritis with bulky spurring. Mild disc space
narrowing.

Case discussed with Dr. YIOTIS.
IMPRESSION: 1. Findings of discitis osteomyelitis at C5-6, C6-7, and possibly
T1-2. Circumferential cervical epidural abscess extending from C2 to
the level of T3-4.
2. Suspected inflammation/collection partially covered in the right
pelvis along the piriformis, possible septic arthritis of the right
sacroiliac joint which is only minimally covered. Recommend
dedicated pelvis MRI.

## 2021-06-16 IMAGING — RF DG CERVICAL SPINE 1V
1 series · 1 of 1 positions shown · non-contrast
Comparison: MRI [DATE]

CLINICAL DATA: Neck pain

EXAM:
DG CERVICAL SPINE - 1 VIEW; DG C-ARM 1-60 MIN

[Series 1: run · 1 of 1 slices shown]
[im 1/1]
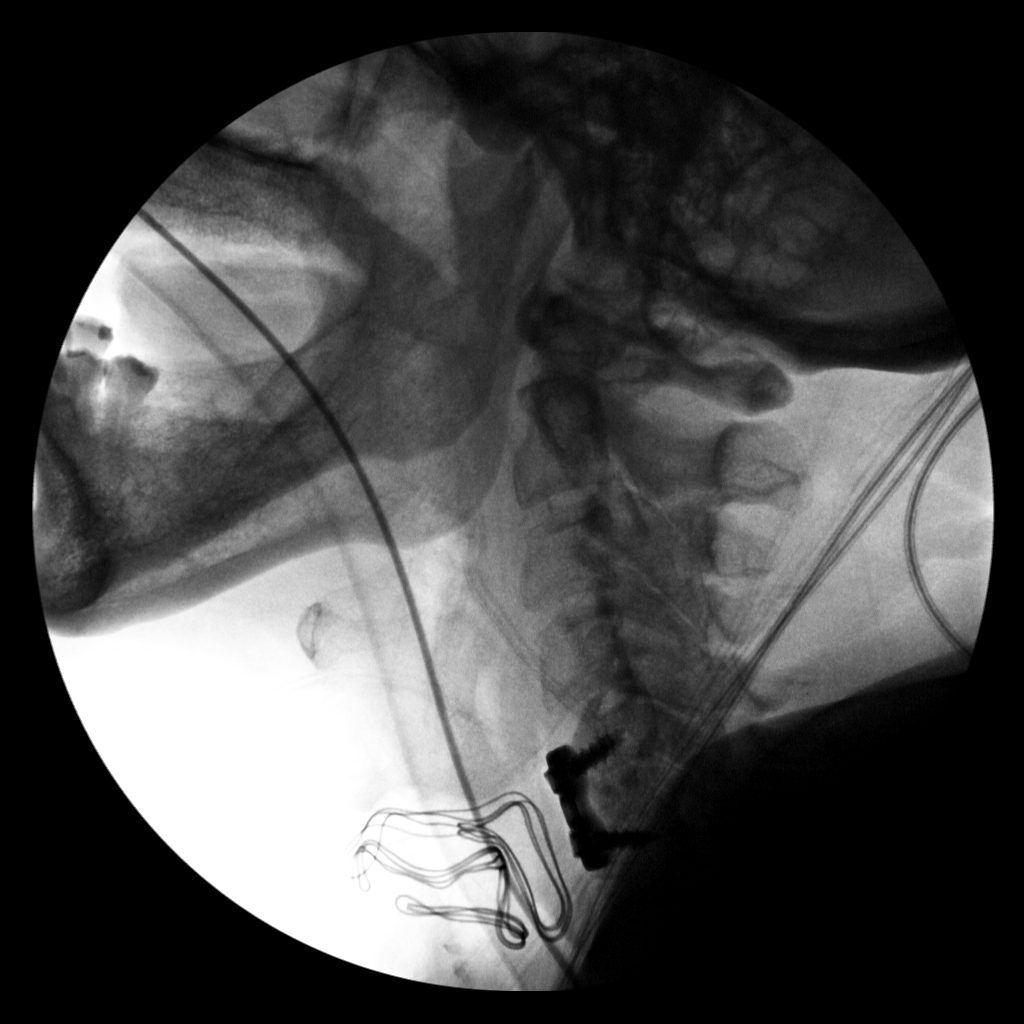

[1 of 1 positions shown; findings below may reference images not displayed]

FINDINGS: Single view intraoperative radiograph of the cervical spine
demonstrates cervical spine from the occiput foot through C5-6. C5-6
anterior cervical discectomy and fusion with instrumentation has
been performed. Normal alignment. NHAYRA is seen within the soft
tissues anterior to the fusion. Endotracheal tube is in place.

Fluoroscopy time: 3 seconds

Dose: 0.25 mGy

Images: 1
IMPRESSION: ACDF C5-6.  NHAYRA in place within the anterior soft tissues.

## 2021-06-16 IMAGING — MR MR LUMBAR SPINE W/O CM
4 of 5 series · 30 of 48 positions shown · non-contrast
Comparison: Cervical spine CT from admission

CLINICAL DATA: Cervical radiculopathy. Infection suspected.
Elevated CRP and leukocytosis.

EXAM:
MRI CERVICAL, THORACIC AND LUMBAR SPINE WITHOUT CONTRAST
TECHNIQUE: Multiplanar and multiecho pulse sequences of the cervical spine, to
include the craniocervical junction and cervicothoracic junction,
and thoracic and lumbar spine, were obtained without intravenous
contrast.

[Series 1: T2 · sagittal · 4.0mm · 0.73mm/px · 6 of 16 slices shown (1 of 2)]
[im 1/16]
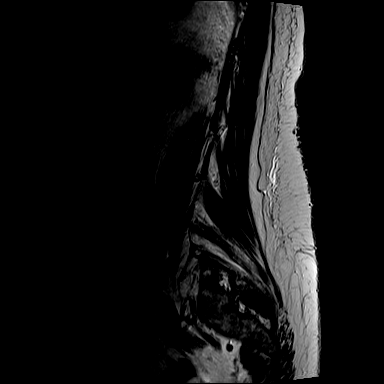
[im 4/16]
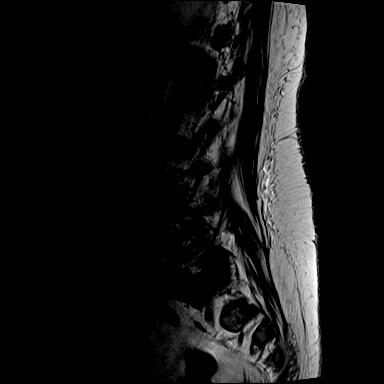
[im 7/16]
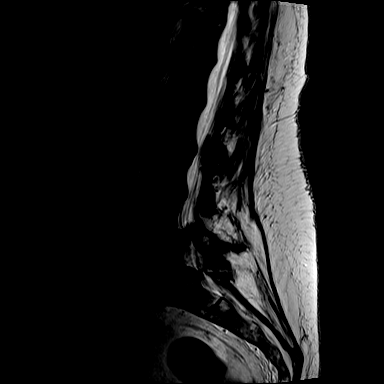
[im 10/16]
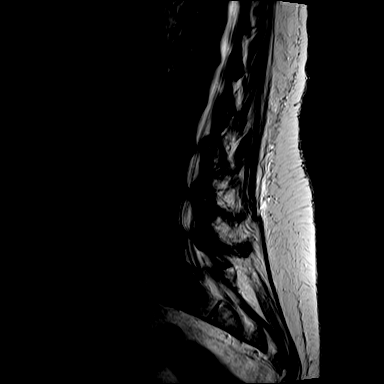
[im 13/16]
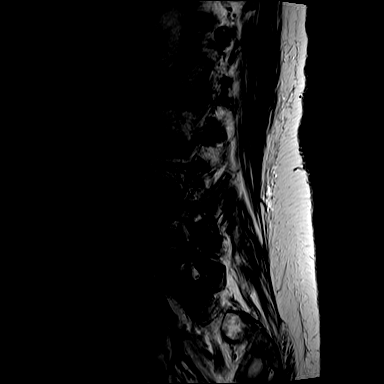
[im 16/16]
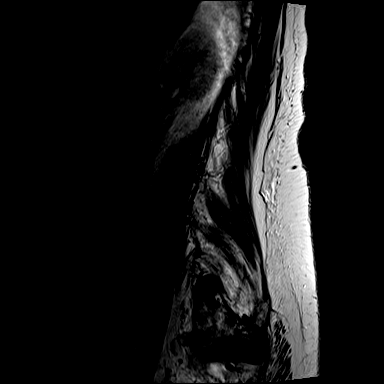

[Series 3: T1 · sagittal · 4.0mm · 1.09mm/px · 6 of 16 slices shown (1 of 2)]
[im 1/16]
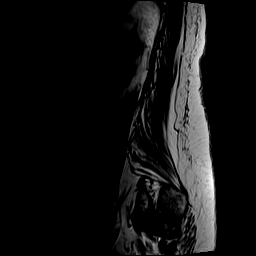
[im 4/16]
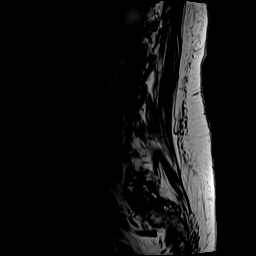
[im 7/16]
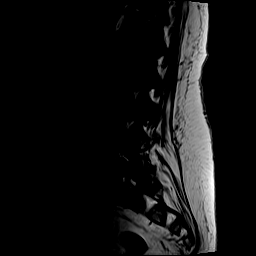
[im 10/16]
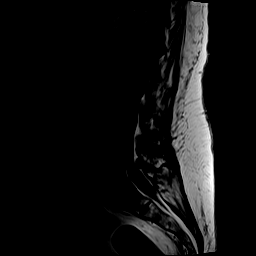
[im 13/16]
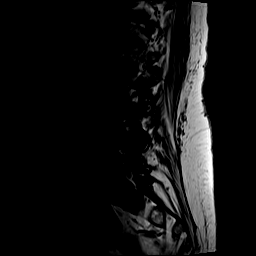
[im 16/16]
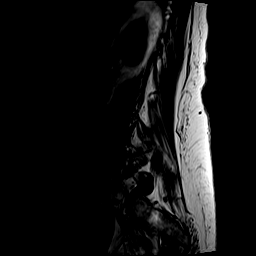

[Series 4: T2 · axial · 4.0mm · 0.86mm/px · z∈[-516,-291]mm · 9 of 39 slices shown (2 of 2)]
[im 1/39]
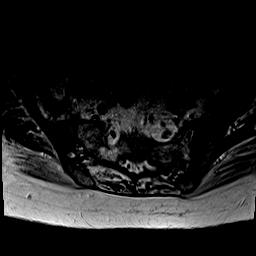
[im 6/39]
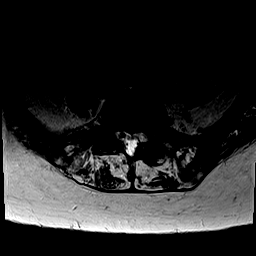
[im 11/39]
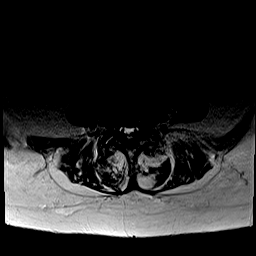
[im 17/39]
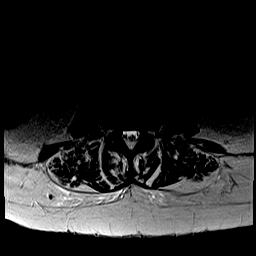
[im 20/39]
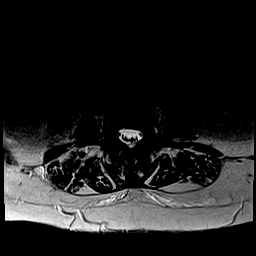
[im 22/39]
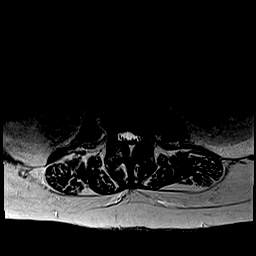
[im 28/39]
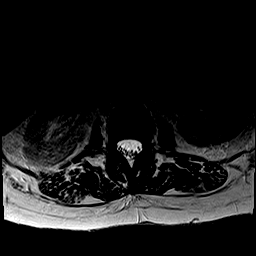
[im 33/39]
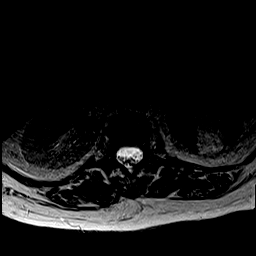
[im 39/39]
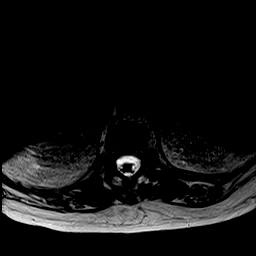

[Series 5: T1 · axial · 4.0mm · 0.43mm/px · z∈[-516,-291]mm · 9 of 39 slices shown (2 of 2)]
[im 1/39]
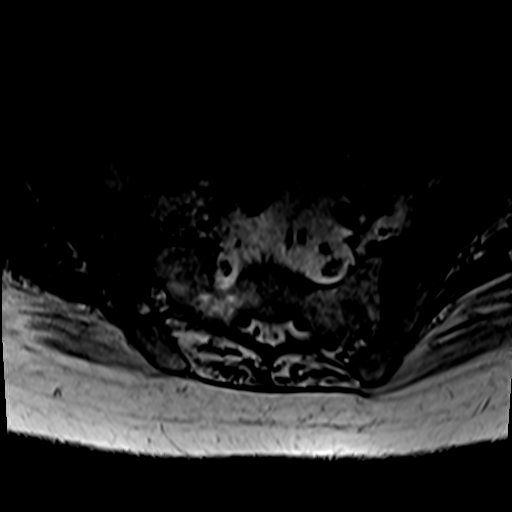
[im 6/39]
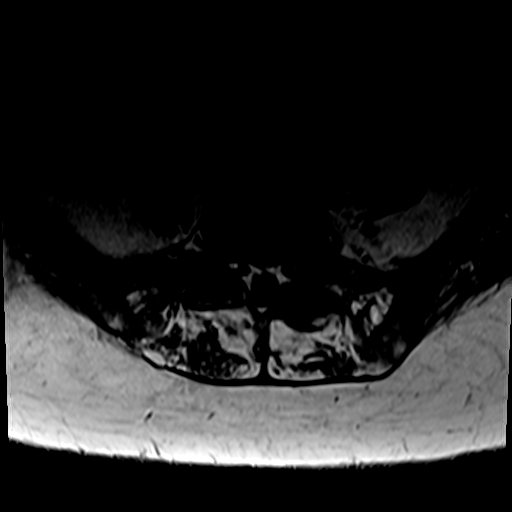
[im 11/39]
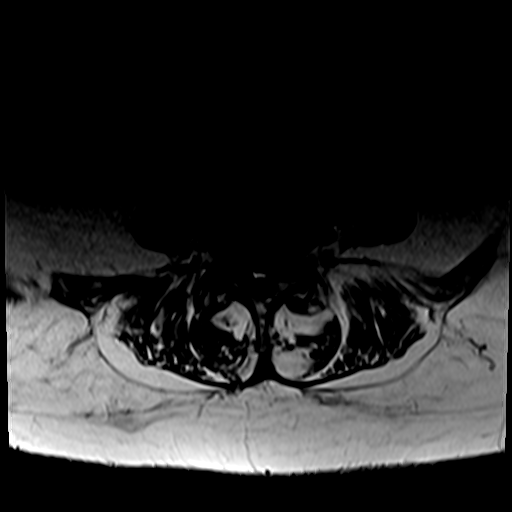
[im 17/39]
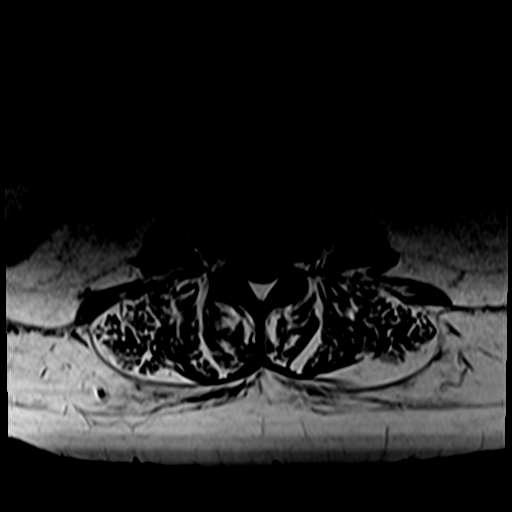
[im 20/39]
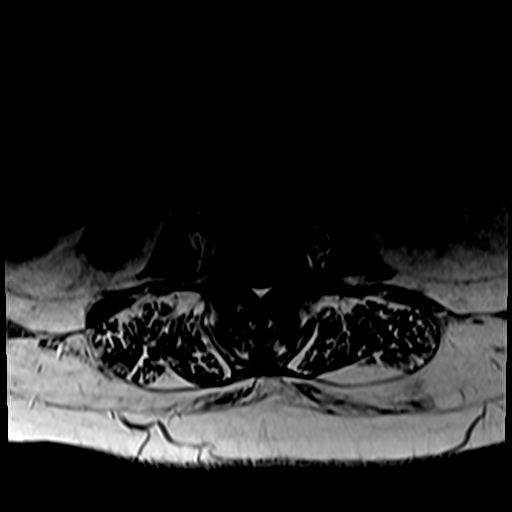
[im 22/39]
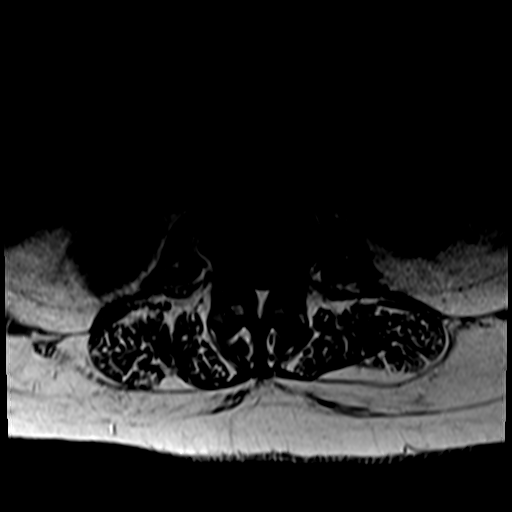
[im 28/39]
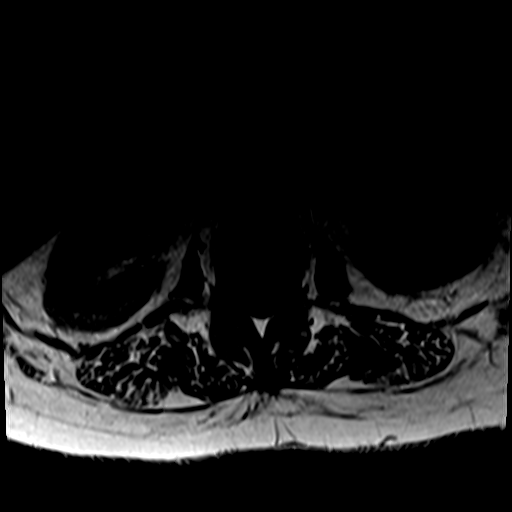
[im 33/39]
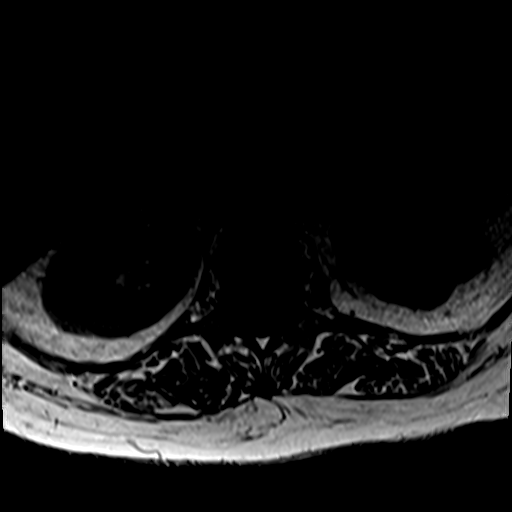
[im 39/39]
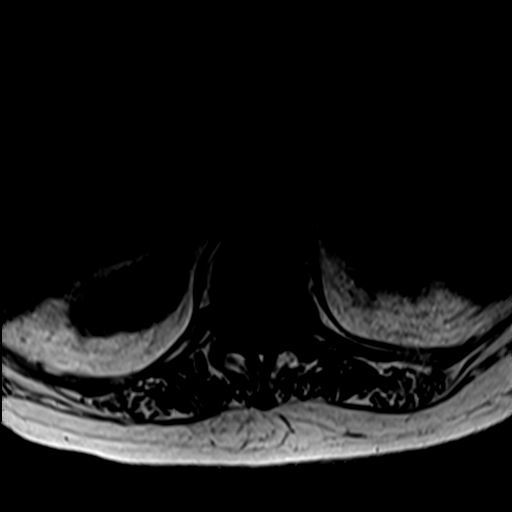

[30 of 48 positions shown; findings below may reference images not displayed]

FINDINGS: MRI CERVICAL SPINE FINDINGS

Alignment: Exaggerated cervical lordosis with mild C7-T1
anterolisthesis.

Vertebrae: Subtle marrow edema at the C5-C7 vertebral levels. There
is disc T2 hyperintensity at C5-6 and C6-7 with extensive
prevertebral edema and posterior-lateral paravertebral edema.

Cord: Multilevel deformity from epidural collection extending from
approximately C2-T4, more inferiorly extensive posteriorly. No
detected cord edema

Posterior Fossa, vertebral arteries, paraspinal tissues: As above

Disc levels:

Given patient motion, evaluation of degenerative disease is limited.
There is diffuse degenerative facet spurring. Milder disc
degeneration with generalized narrowing and bulging.

MRI THORACIC SPINE FINDINGS

Alignment:  Exaggerated thoracic kyphosis.

Vertebrae: T1-2 disc and adjacent endplate hyperintensity.

Cord: Normal signal and morphology. Epidural collection continues
from the cervical levels into the upper thoracic spine, posteriorly
terminating at approximately T3-4.

Paraspinal and other soft tissues: Paravertebral edema at the upper
thoracic levels related to cervical spine disease.

Disc levels:

Generalize bridging osteophytes and ankylosis. Upper thoracic
degenerative facet spurring. No canal or foraminal impingement

MRI LUMBAR SPINE FINDINGS

Segmentation:  5 lumbar type vertebrae

Alignment:  Mild degenerative anterolisthesis at L4-5

Vertebrae: No visible fracture, discitis, or aggressive bone lesion.

Conus medullaris and cauda equina: Conus extends to the T12-L1
level. Conus and cauda equina appear normal.

Paraspinal and other soft tissues: Cystic appearing spaces in the
presacral space on the right intimately associated with piriformis.
The sacroiliac joints are largely on covered. No visible adjacent
marrow edema

Disc levels:

T12- L1: Disc narrowing and bulging.  Mild facet spurring

L1-L2: Disc narrowing and bulging.  Mild facet spurring

L2-L3: Disc narrowing and bulging with degenerative facet spurring.
Left subarticular recess stenosis that could affect the L3 nerve
root

L3-L4: Disc narrowing and bulging with degenerative facet spurring

L4-L5: Facet osteoarthritis with spurring and anterolisthesis. The
disc is narrowed and bulging. Moderate spinal stenosis.

L5-S1:Facet osteoarthritis with bulky spurring. Mild disc space
narrowing.

Case discussed with Dr. YIOTIS.
IMPRESSION: 1. Findings of discitis osteomyelitis at C5-6, C6-7, and possibly
T1-2. Circumferential cervical epidural abscess extending from C2 to
the level of T3-4.
2. Suspected inflammation/collection partially covered in the right
pelvis along the piriformis, possible septic arthritis of the right
sacroiliac joint which is only minimally covered. Recommend
dedicated pelvis MRI.

## 2021-06-16 IMAGING — MR MR THORACIC SPINE W/O CM
7 series · 37 of 48 positions shown · non-contrast
Comparison: Cervical spine CT from admission

CLINICAL DATA: Cervical radiculopathy. Infection suspected.
Elevated CRP and leukocytosis.

EXAM:
MRI CERVICAL, THORACIC AND LUMBAR SPINE WITHOUT CONTRAST
TECHNIQUE: Multiplanar and multiecho pulse sequences of the cervical spine, to
include the craniocervical junction and cervicothoracic junction,
and thoracic and lumbar spine, were obtained without intravenous
contrast.

[Series 20: T1 · sagittal · 5.0mm · 1.46mm/px · 3 of 9 slices shown (1 of 3)]
[im 1/9]
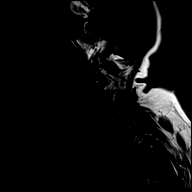
[im 5/9]
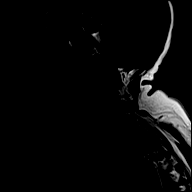
[im 9/9]
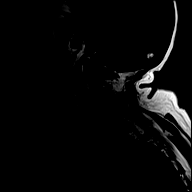

[Series 21: T1 · sagittal · 5.0mm · 1.23mm/px · 3 of 9 slices shown (2 of 3)]
[im 1/9]
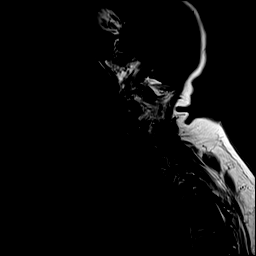
[im 5/9]
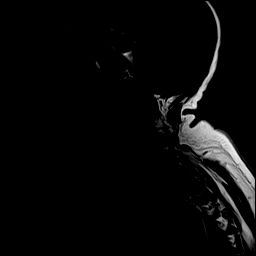
[im 9/9]
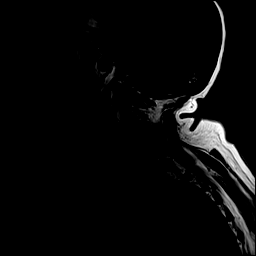

[Series 29: T2 · sagittal · 3.0mm · 1.06mm/px · 6 of 17 slices shown (1 of 2)]
[im 1/17]
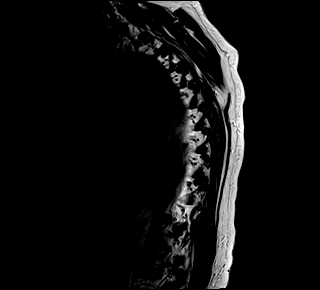
[im 4/17]
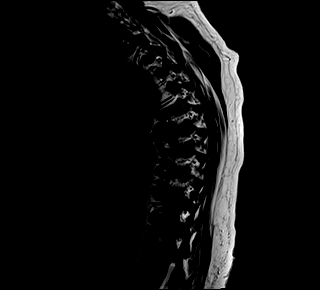
[im 7/17]
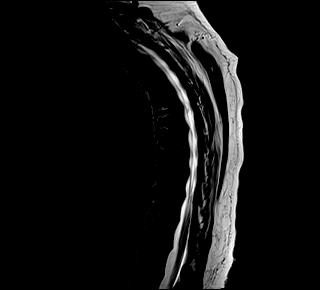
[im 10/17]
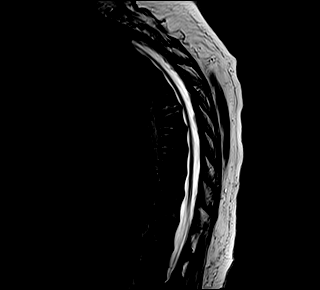
[im 13/17]
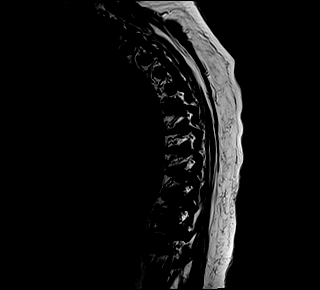
[im 17/17]
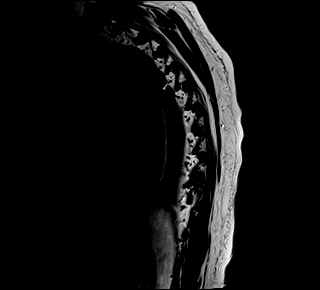

[Series 30: T1 · sagittal · 3.0mm · 1.33mm/px · 6 of 17 slices shown (3 of 3)]
[im 1/17]
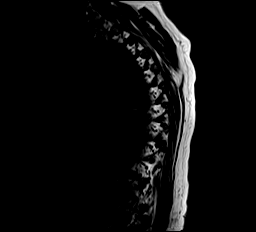
[im 4/17]
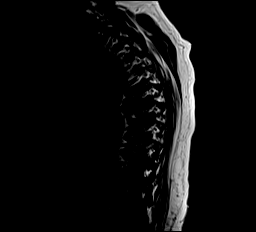
[im 7/17]
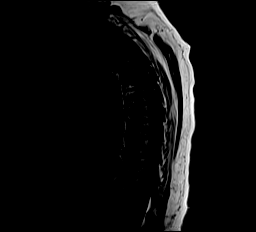
[im 10/17]
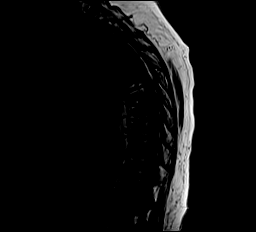
[im 13/17]
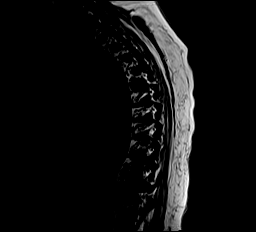
[im 17/17]
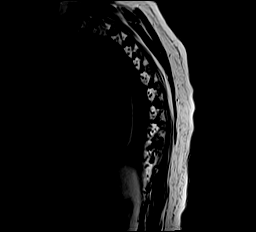

[Series 31: STIR · sagittal · 3.0mm · 0.66mm/px · 6 of 17 slices shown]
[im 1/17]
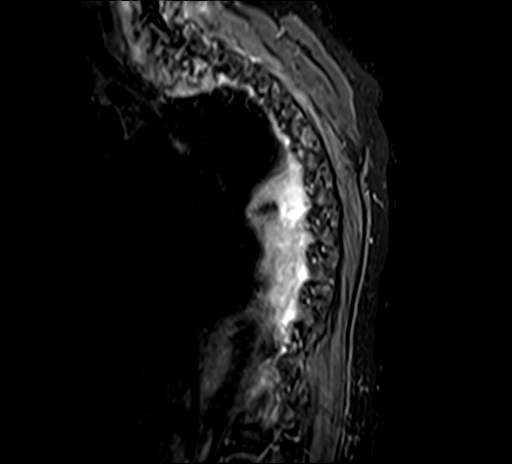
[im 4/17]
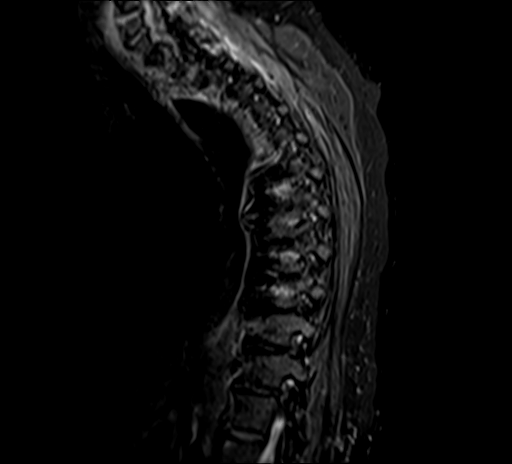
[im 7/17]
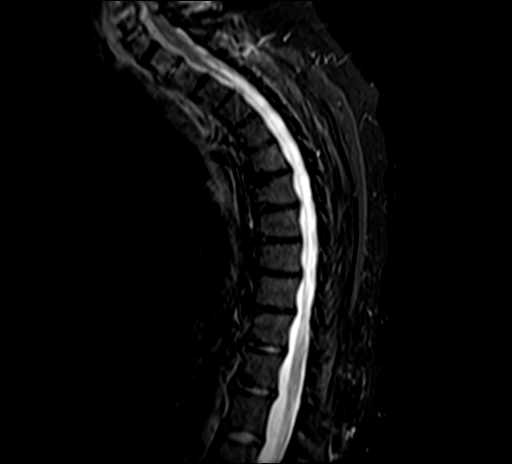
[im 10/17]
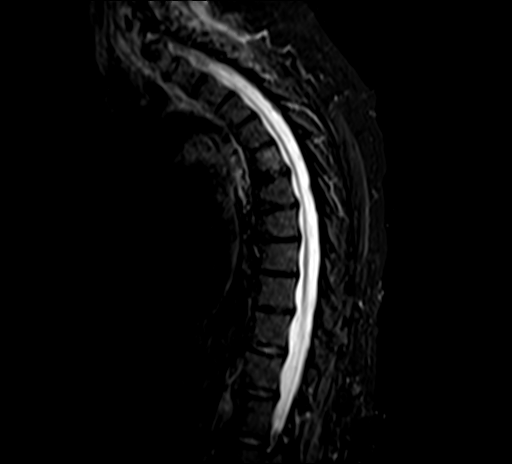
[im 13/17]
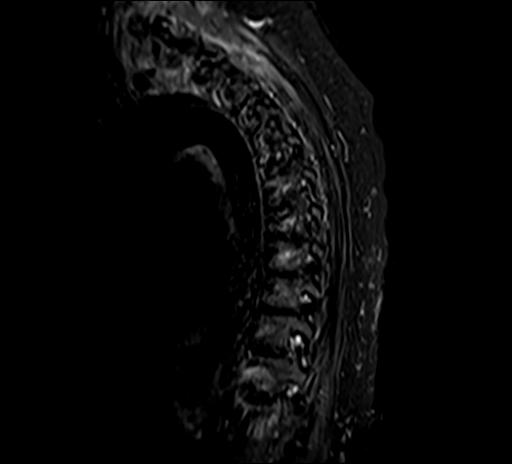
[im 17/17]
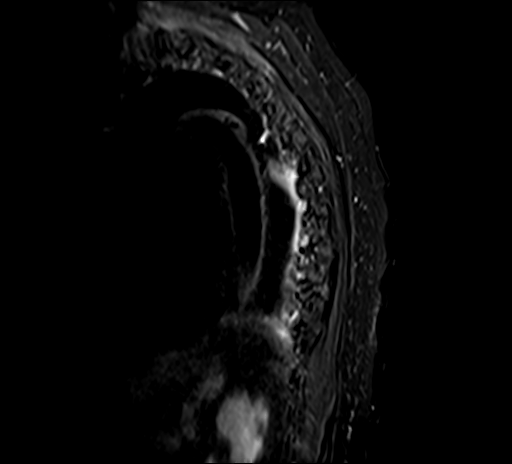

[Series 32: T2 · axial · 4.0mm · 0.74mm/px · z∈[-306,-157]mm · 9 of 36 slices shown (2 of 2)]
[im 1/36]
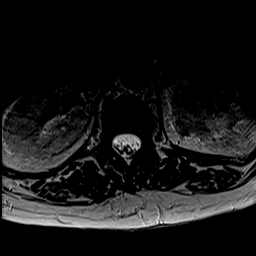
[im 7/36]
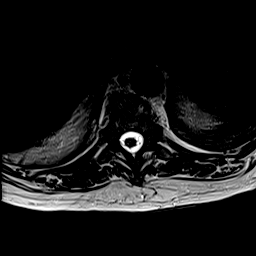
[im 10/36]
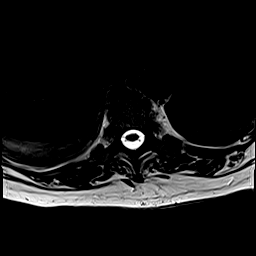
[im 16/36]
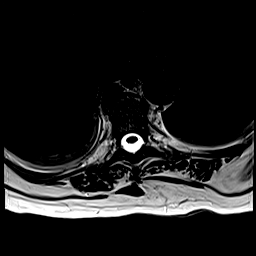
[im 20/36]
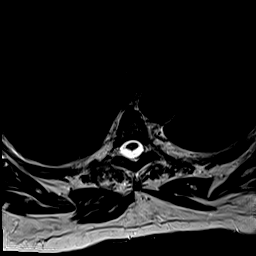
[im 26/36]
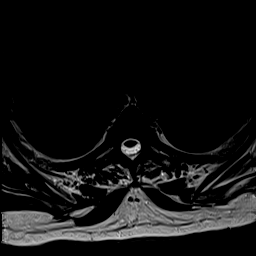
[im 29/36]
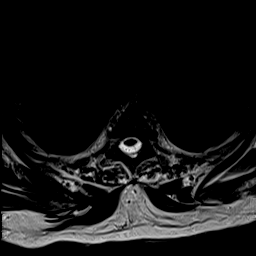
[im 32/36]
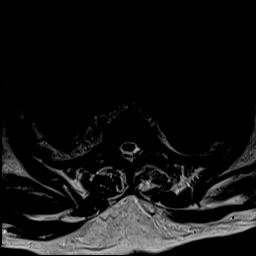
[im 36/36]
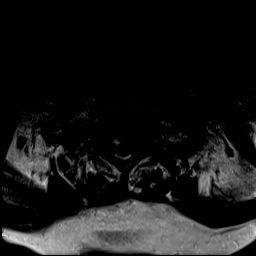

[Series 33: GRE · axial · 4.0mm · 0.37mm/px · z∈[-306,-199]mm · 4 of 36 slices shown]
[im 1/36]
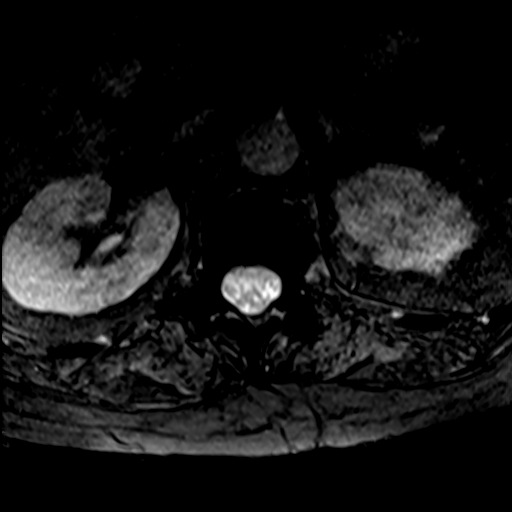
[im 7/36]
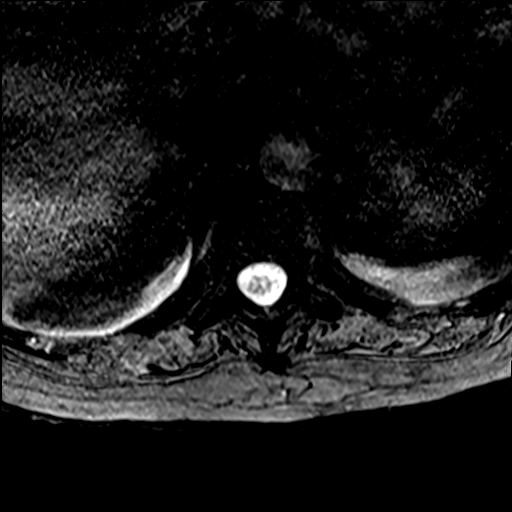
[im 10/36]
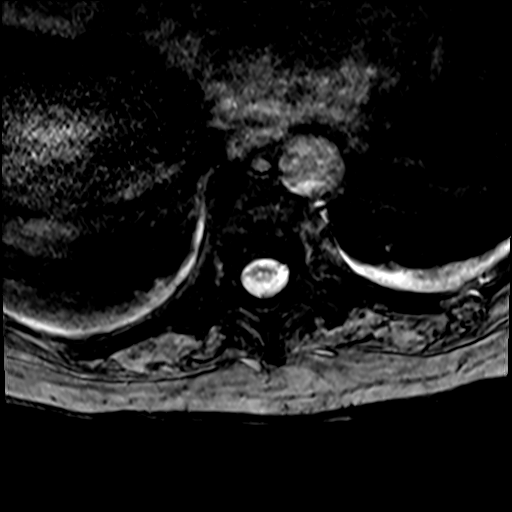
[im 16/36]
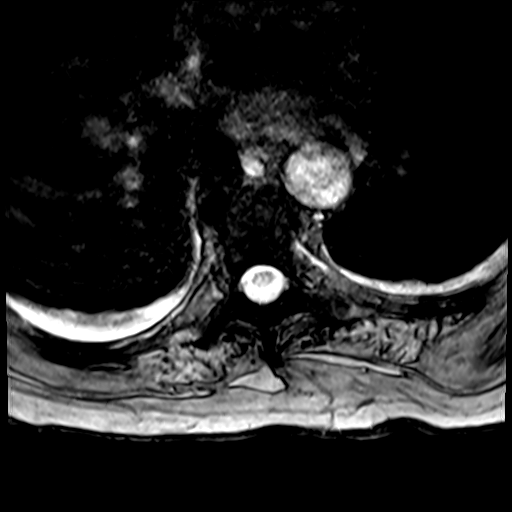

[37 of 48 positions shown; findings below may reference images not displayed]

FINDINGS: MRI CERVICAL SPINE FINDINGS

Alignment: Exaggerated cervical lordosis with mild C7-T1
anterolisthesis.

Vertebrae: Subtle marrow edema at the C5-C7 vertebral levels. There
is disc T2 hyperintensity at C5-6 and C6-7 with extensive
prevertebral edema and posterior-lateral paravertebral edema.

Cord: Multilevel deformity from epidural collection extending from
approximately C2-T4, more inferiorly extensive posteriorly. No
detected cord edema

Posterior Fossa, vertebral arteries, paraspinal tissues: As above

Disc levels:

Given patient motion, evaluation of degenerative disease is limited.
There is diffuse degenerative facet spurring. Milder disc
degeneration with generalized narrowing and bulging.

MRI THORACIC SPINE FINDINGS

Alignment:  Exaggerated thoracic kyphosis.

Vertebrae: T1-2 disc and adjacent endplate hyperintensity.

Cord: Normal signal and morphology. Epidural collection continues
from the cervical levels into the upper thoracic spine, posteriorly
terminating at approximately T3-4.

Paraspinal and other soft tissues: Paravertebral edema at the upper
thoracic levels related to cervical spine disease.

Disc levels:

Generalize bridging osteophytes and ankylosis. Upper thoracic
degenerative facet spurring. No canal or foraminal impingement

MRI LUMBAR SPINE FINDINGS

Segmentation:  5 lumbar type vertebrae

Alignment:  Mild degenerative anterolisthesis at L4-5

Vertebrae: No visible fracture, discitis, or aggressive bone lesion.

Conus medullaris and cauda equina: Conus extends to the T12-L1
level. Conus and cauda equina appear normal.

Paraspinal and other soft tissues: Cystic appearing spaces in the
presacral space on the right intimately associated with piriformis.
The sacroiliac joints are largely on covered. No visible adjacent
marrow edema

Disc levels:

T12- L1: Disc narrowing and bulging.  Mild facet spurring

L1-L2: Disc narrowing and bulging.  Mild facet spurring

L2-L3: Disc narrowing and bulging with degenerative facet spurring.
Left subarticular recess stenosis that could affect the L3 nerve
root

L3-L4: Disc narrowing and bulging with degenerative facet spurring

L4-L5: Facet osteoarthritis with spurring and anterolisthesis. The
disc is narrowed and bulging. Moderate spinal stenosis.

L5-S1:Facet osteoarthritis with bulky spurring. Mild disc space
narrowing.

Case discussed with Dr. YIOTIS.
IMPRESSION: 1. Findings of discitis osteomyelitis at C5-6, C6-7, and possibly
T1-2. Circumferential cervical epidural abscess extending from C2 to
the level of T3-4.
2. Suspected inflammation/collection partially covered in the right
pelvis along the piriformis, possible septic arthritis of the right
sacroiliac joint which is only minimally covered. Recommend
dedicated pelvis MRI.

## 2021-06-16 SURGERY — ANTERIOR CERVICAL DECOMPRESSION/DISCECTOMY FUSION 1 LEVEL
Anesthesia: General | Site: Neck

## 2021-06-16 MED ORDER — HYDROCODONE-ACETAMINOPHEN 5-325 MG PO TABS
1.0000 | ORAL_TABLET | ORAL | Status: DC | PRN
Start: 2021-06-16 — End: 2021-06-18

## 2021-06-16 MED ORDER — CHLORHEXIDINE GLUCONATE 0.12 % MT SOLN
OROMUCOSAL | Status: AC
  Administered 2021-06-16: 15 mL via OROMUCOSAL
  Filled 2021-06-16: qty 15

## 2021-06-16 MED ORDER — FENTANYL CITRATE (PF) 250 MCG/5ML IJ SOLN
INTRAMUSCULAR | Status: AC
  Filled 2021-06-16: qty 5

## 2021-06-16 MED ORDER — ONDANSETRON HCL 4 MG/2ML IJ SOLN
INTRAMUSCULAR | Status: AC
  Filled 2021-06-16: qty 2

## 2021-06-16 MED ORDER — LIDOCAINE-EPINEPHRINE 1 %-1:100000 IJ SOLN
INTRAMUSCULAR | Status: DC | PRN
  Administered 2021-06-16: 9 mL via INTRADERMAL

## 2021-06-16 MED ORDER — EPHEDRINE 5 MG/ML INJ
INTRAVENOUS | Status: AC
  Filled 2021-06-16: qty 5

## 2021-06-16 MED ORDER — LIDOCAINE 2% (20 MG/ML) 5 ML SYRINGE
INTRAMUSCULAR | Status: AC
  Filled 2021-06-16: qty 5

## 2021-06-16 MED ORDER — LACTATED RINGERS IV SOLN: INTRAVENOUS | Status: DC

## 2021-06-16 MED ORDER — PHENYLEPHRINE 40 MCG/ML (10ML) SYRINGE FOR IV PUSH (FOR BLOOD PRESSURE SUPPORT)
PREFILLED_SYRINGE | INTRAVENOUS | Status: DC | PRN
  Administered 2021-06-16: 80 ug via INTRAVENOUS
  Administered 2021-06-16: 120 ug via INTRAVENOUS
  Administered 2021-06-16: 200 ug via INTRAVENOUS

## 2021-06-16 MED ORDER — THROMBIN 5000 UNITS EX SOLR
CUTANEOUS | Status: AC
  Filled 2021-06-16: qty 5000

## 2021-06-16 MED ORDER — DEXAMETHASONE SODIUM PHOSPHATE 10 MG/ML IJ SOLN
INTRAMUSCULAR | Status: AC
  Filled 2021-06-16: qty 1

## 2021-06-16 MED ORDER — FENTANYL CITRATE (PF) 250 MCG/5ML IJ SOLN
INTRAMUSCULAR | Status: DC | PRN
  Administered 2021-06-16: 100 ug via INTRAVENOUS

## 2021-06-16 MED ORDER — ROCURONIUM BROMIDE 10 MG/ML (PF) SYRINGE
PREFILLED_SYRINGE | INTRAVENOUS | Status: DC | PRN
  Administered 2021-06-16: 70 mg via INTRAVENOUS

## 2021-06-16 MED ORDER — EPHEDRINE SULFATE-NACL 50-0.9 MG/10ML-% IV SOSY
PREFILLED_SYRINGE | INTRAVENOUS | Status: DC | PRN
  Administered 2021-06-16: 5 mg via INTRAVENOUS

## 2021-06-16 MED ORDER — CHLORHEXIDINE GLUCONATE CLOTH 2 % EX PADS
6.0000 | MEDICATED_PAD | Freq: Every day | CUTANEOUS | Status: DC
  Administered 2021-06-16 – 2021-07-11 (×26): 6 via TOPICAL

## 2021-06-16 MED ORDER — LIDOCAINE-EPINEPHRINE 1 %-1:100000 IJ SOLN
INTRAMUSCULAR | Status: AC
  Filled 2021-06-16: qty 1

## 2021-06-16 MED ORDER — DEXAMETHASONE SODIUM PHOSPHATE 10 MG/ML IJ SOLN
INTRAMUSCULAR | Status: DC | PRN
  Administered 2021-06-16: 5 mg via INTRAVENOUS

## 2021-06-16 MED ORDER — THROMBIN 5000 UNITS EX SOLR
OROMUCOSAL | Status: DC | PRN
  Administered 2021-06-16: 5 mL via TOPICAL

## 2021-06-16 MED ORDER — CHLORHEXIDINE GLUCONATE 0.12 % MT SOLN: 15.0000 mL | Freq: Once | OROMUCOSAL | Status: AC

## 2021-06-16 MED ORDER — LIDOCAINE 2% (20 MG/ML) 5 ML SYRINGE
INTRAMUSCULAR | Status: DC | PRN
  Administered 2021-06-16: 60 mg via INTRAVENOUS

## 2021-06-16 MED ORDER — THROMBIN 20000 UNITS EX SOLR
CUTANEOUS | Status: DC | PRN
  Administered 2021-06-16: 20 mL via TOPICAL

## 2021-06-16 MED ORDER — ROCURONIUM BROMIDE 10 MG/ML (PF) SYRINGE
PREFILLED_SYRINGE | INTRAVENOUS | Status: AC
  Filled 2021-06-16: qty 10

## 2021-06-16 MED ORDER — PROPOFOL 10 MG/ML IV BOLUS
INTRAVENOUS | Status: AC
  Filled 2021-06-16: qty 20

## 2021-06-16 MED ORDER — THROMBIN 20000 UNITS EX SOLR
CUTANEOUS | Status: AC
  Filled 2021-06-16: qty 20000

## 2021-06-16 MED ORDER — PHENYLEPHRINE HCL-NACL 20-0.9 MG/250ML-% IV SOLN
INTRAVENOUS | Status: DC | PRN
  Administered 2021-06-16: 20 ug/min via INTRAVENOUS

## 2021-06-16 MED ORDER — ONDANSETRON HCL 4 MG/2ML IJ SOLN
INTRAMUSCULAR | Status: DC | PRN
  Administered 2021-06-16: 4 mg via INTRAVENOUS

## 2021-06-16 MED ORDER — FENTANYL CITRATE (PF) 100 MCG/2ML IJ SOLN
INTRAMUSCULAR | Status: AC
  Filled 2021-06-16: qty 2

## 2021-06-16 MED ORDER — SUGAMMADEX SODIUM 200 MG/2ML IV SOLN
INTRAVENOUS | Status: DC | PRN
  Administered 2021-06-16: 200 mg via INTRAVENOUS

## 2021-06-16 MED ORDER — FENTANYL CITRATE (PF) 100 MCG/2ML IJ SOLN
25.0000 ug | INTRAMUSCULAR | Status: DC | PRN
  Administered 2021-06-16: 25 ug via INTRAVENOUS
  Administered 2021-06-16: 50 ug via INTRAVENOUS
  Administered 2021-06-16: 25 ug via INTRAVENOUS

## 2021-06-16 MED ORDER — PHENYLEPHRINE 40 MCG/ML (10ML) SYRINGE FOR IV PUSH (FOR BLOOD PRESSURE SUPPORT)
PREFILLED_SYRINGE | INTRAVENOUS | Status: AC
  Filled 2021-06-16: qty 10

## 2021-06-16 MED ORDER — PROPOFOL 10 MG/ML IV BOLUS
INTRAVENOUS | Status: DC | PRN
  Administered 2021-06-16: 100 mg via INTRAVENOUS

## 2021-06-16 MED ORDER — MORPHINE SULFATE (PF) 2 MG/ML IV SOLN
1.0000 mg | INTRAVENOUS | Status: DC | PRN
  Administered 2021-06-16 – 2021-06-21 (×7): 1 mg via INTRAVENOUS
  Filled 2021-06-16 (×10): qty 1

## 2021-06-16 MED ORDER — 0.9 % SODIUM CHLORIDE (POUR BTL) OPTIME
TOPICAL | Status: DC | PRN
  Administered 2021-06-16: 1000 mL

## 2021-06-16 SURGICAL SUPPLY — 56 items
ADH SKN CLS APL DERMABOND .7 (GAUZE/BANDAGES/DRESSINGS) ×1
APL SKNCLS STERI-STRIP NONHPOA (GAUZE/BANDAGES/DRESSINGS)
BAG COUNTER SPONGE SURGICOUNT (BAG) ×2 IMPLANT
BAG SPNG CNTER NS LX DISP (BAG) ×1
BAND INSRT 18 STRL LF DISP RB (MISCELLANEOUS) ×2
BAND RUBBER #18 3X1/16 STRL (MISCELLANEOUS) ×4 IMPLANT
BENZOIN TINCTURE PRP APPL 2/3 (GAUZE/BANDAGES/DRESSINGS) IMPLANT
BLADE CLIPPER SURG (BLADE) IMPLANT
BLADE SURG 11 STRL SS (BLADE) IMPLANT
BUR MATCHSTICK NEURO 3.0 LAGG (BURR) ×2 IMPLANT
CANISTER SUCT 3000ML PPV (MISCELLANEOUS) ×2 IMPLANT
DECANTER SPIKE VIAL GLASS SM (MISCELLANEOUS) ×2 IMPLANT
DERMABOND ADVANCED (GAUZE/BANDAGES/DRESSINGS) ×1
DERMABOND ADVANCED .7 DNX12 (GAUZE/BANDAGES/DRESSINGS) ×1 IMPLANT
DRAPE C-ARM 42X72 X-RAY (DRAPES) ×4 IMPLANT
DRAPE HALF SHEET 40X57 (DRAPES) IMPLANT
DRAPE LAPAROTOMY 100X72 PEDS (DRAPES) ×2 IMPLANT
DRAPE MICROSCOPE LEICA (MISCELLANEOUS) ×2 IMPLANT
DURAPREP 6ML APPLICATOR 50/CS (WOUND CARE) ×2 IMPLANT
ELECT COATED BLADE 2.86 ST (ELECTRODE) ×2 IMPLANT
ELECT REM PT RETURN 9FT ADLT (ELECTROSURGICAL) ×2
ELECTRODE REM PT RTRN 9FT ADLT (ELECTROSURGICAL) ×1 IMPLANT
GAUZE 4X4 16PLY ~~LOC~~+RFID DBL (SPONGE) ×2 IMPLANT
GLOVE EXAM NITRILE LRG STRL (GLOVE) IMPLANT
GLOVE EXAM NITRILE XL STR (GLOVE) IMPLANT
GLOVE EXAM NITRILE XS STR PU (GLOVE) IMPLANT
GLOVE SURG LTX SZ7 (GLOVE) ×4 IMPLANT
GLOVE SURG LTX SZ7.5 (GLOVE) ×2 IMPLANT
GLOVE SURG UNDER POLY LF SZ7.5 (GLOVE) ×8 IMPLANT
GOWN STRL REUS W/ TWL LRG LVL3 (GOWN DISPOSABLE) ×3 IMPLANT
GOWN STRL REUS W/ TWL XL LVL3 (GOWN DISPOSABLE) IMPLANT
GOWN STRL REUS W/TWL 2XL LVL3 (GOWN DISPOSABLE) IMPLANT
GOWN STRL REUS W/TWL LRG LVL3 (GOWN DISPOSABLE) ×6
GOWN STRL REUS W/TWL XL LVL3 (GOWN DISPOSABLE)
HEMOSTAT POWDER KIT SURGIFOAM (HEMOSTASIS) ×2 IMPLANT
KIT BASIN OR (CUSTOM PROCEDURE TRAY) ×2 IMPLANT
KIT TURNOVER KIT B (KITS) ×2 IMPLANT
NEEDLE HYPO 22GX1.5 SAFETY (NEEDLE) ×2 IMPLANT
NEEDLE SPNL 18GX3.5 QUINCKE PK (NEEDLE) IMPLANT
NS IRRIG 1000ML POUR BTL (IV SOLUTION) ×2 IMPLANT
PACK LAMINECTOMY NEURO (CUSTOM PROCEDURE TRAY) ×2 IMPLANT
PAD ARMBOARD 7.5X6 YLW CONV (MISCELLANEOUS) ×4 IMPLANT
PIN DISTRACTION 14MM (PIN) IMPLANT
PLATE ELITE 21MM (Plate) ×2 IMPLANT
SCREW SELF TAP VAR 4.0X13 (Screw) ×8 IMPLANT
SPACER BONE CORNERSTONE 6X14 (Orthopedic Implant) ×2 IMPLANT
SPONGE INTESTINAL PEANUT (DISPOSABLE) ×2 IMPLANT
SPONGE SURGIFOAM ABS GEL SZ50 (HEMOSTASIS) IMPLANT
STAPLER VISISTAT 35W (STAPLE) IMPLANT
SUT MNCRL AB 3-0 PS2 18 (SUTURE) ×2 IMPLANT
SUT VIC AB 2-0 CP2 18 (SUTURE) ×2 IMPLANT
SUT VIC AB 3-0 SH 8-18 (SUTURE) IMPLANT
TAPE CLOTH 3X10 TAN LF (GAUZE/BANDAGES/DRESSINGS) ×2 IMPLANT
TOWEL GREEN STERILE (TOWEL DISPOSABLE) ×2 IMPLANT
TOWEL GREEN STERILE FF (TOWEL DISPOSABLE) ×2 IMPLANT
WATER STERILE IRR 1000ML POUR (IV SOLUTION) ×2 IMPLANT

## 2021-06-16 NOTE — Evaluation (Signed)
Na2+154. NS@75  cont held. Physician notified.

## 2021-06-16 NOTE — Progress Notes (Signed)
Pt returned from MRI °

## 2021-06-16 NOTE — Progress Notes (Signed)
Bladder scanner indicated 929 ml of urine in the bladder. MD on call notified and order for Foley catheter insertion received. Foley catheter placed at the bedside in 2w26 for urinary retention by Christean Grief, RN in presence of Jerald Kief, LPN. Peri care performed, bed pad and pt gown changed. Procedure explained to pt. Foley catheter inserted w/o difficulty, with first attempt, sterile procedure maintained throughout procedure and pt tolerated well. About of tea colored urine obtained. Foley care completed. Pt resting comfortably after procedure. Will continue to monitor pt.

## 2021-06-16 NOTE — Progress Notes (Signed)
Neurosurgery Service Post-operative progress note  Assessment & Plan: 75 y.o. woman s/p C5-6 ACDF for epidural abscess. Intra-op findings w/ copious amounts of pus in the longus colli with myositis, discitis, osteomyelitis, large epidural abscess s/p drainage, cultures sent x2, can return to her bed on 2W from my perspective, no drains present, does not need a cervical collar, advance activity and diet as tolerated, no restrictions.  Kristy [name]  06/16/21 1:38 PM

## 2021-06-16 NOTE — Progress Notes (Addendum)
Neurology Progress Note  Patient ID: Kristy [name] is a 75 y.o. with PMHx of  alcohol abuse.   Initially consulted for: change in mental status.    Major interval events:  06/14/21: change in mental status, nuchal rigidity, fever and white count increase from 10.8 on admission in 06/10/21 to 25.3 on 06/14/21. 06/15/21 Two attempts at bedside LP were unsuccessful. For fluoro guided LP   Subjective: Moans and groans only with noxious stimuli.  Patient uses clear words (stop, don't) with noxious stimuli.   Exam:last temp recorded 06/15/21 at 0137 98.72F Vitals:   06/16/21 0300 06/16/21 0804  BP: (!) 145/70 (!) 145/101  Pulse: 87   Resp: (!) 22   Temp: 98.2 F (36.8 C)   SpO2: 98%    Gen: In bed, appears slightly more comfortable than prior evaluation. Moans in Pain with attempts to manipulate neck.   Resp: non-labored breathing, no grossly audible wheezing Cardiac: Perfusing extremities well  Abd: soft, nt  Neuro: MS: eyes are closed mostly but opens transiently when name is called. Slightly more responsive today than prior exam, but she mostly moans throughout exam, with mumbled words and painful cries when touched. Does not attempt to follow commands. Unable to assess orientation given status.  ZO:[id number] to assess. Face is symmetric. Unable to cooperate to rest of exam Motor: normal bulk. Weakly withdraws both arms and legs to noxious stimuli. Sensory:appears intact to noxious stimuli.     Pertinent Labs:  Results for Kristy, [name] (MRN [id number]) as of 06/15/2021 11:32  Ref. Range 06/14/2021 13:15  WBC Latest Ref Range: 4.0 - 10.5 K/uL 25.3 (H)  RBC Latest Ref Range: 3.87 - 5.11 MIL/uL 4.24  Hemoglobin Latest Ref Range: 12.0 - 15.0 g/dL 29.5  HCT Latest Ref Range: 36.0 - 46.0 % 40.7  MCV Latest Ref Range: 80.0 - 100.0 fL 96.0  MCH Latest Ref Range: 26.0 - 34.0 pg 32.3  MCHC Latest Ref Range: 30.0 - 36.0 g/dL 62.1  RDW Latest Ref Range: 11.5 - 15.5 % 13.5  Platelets Latest Ref  Range: 150 - 400 K/uL 205  Results for Kristy, [name] (MRN [id number]) as of 06/15/2021 11:32  Ref. Range 06/14/2021 20:13  Folate Latest Ref Range: >5.9 ng/mL 16.0    Impression:  Kristy [name] is a 75 year old female w/pmh of  alcohol use who is admitted 2/2 frequent falls, generalized weakness, and confusion and today developed acute onset nuchal rigidity, change in mental status, fever and an increase in WBC from 13.9 to 25.3.   Her neurological examination is pertinent for nuchal rigidity- she is holding her neck stiffly and when the examiner attempts to move it, she is clearly in pain.   Encephalopathy labs with normal B12, TSH, no elevated Ammonia.  High suspicion for meningitis given leukocytosis and fever. Further workup with MRI spine done this morning shows discitis osteomyelitis at C5-6, C6-7, and possibly T1-2 with associated epidural abscess from C2 to T4. Likely localized spread from the osteomyelitis into the meninges resulting in meningitis. Neurosurgery has seen patient and has obtained consent for ACDF today and evacuation of the epidural abscess.   On acyclovir 595mg  twice daily, ampicillin 2gm every 4 hours, rocephin 2gm every 12 hours, vancomycin 1.25gm daily.     Recommendations: - Continue with empiric antibiotic treatment outlined above. Recommend ID consult to assist with antibiotics given osteomyelitis in addition to meningitis and epidural abscess. I think we can discontinue acyclovir. Unlikely for this to be viral. -  ACDF and evacuation of the epidural abscess today per Neurosurg. - Can hold off on LP under fluoro. I think we can get the same data from cultures of the evacuated abscess. Will recommend runing it by the ID team thou before taking her off the IR schedule. - Switch to Thiamine 100mg  PO or IV daily. - Not much for to add at this point. ID to assist with Abx and NSGY will evacuate the abscess for source control. We will signoff. Please feel free to contact us  with any questions or concerns.

## 2021-06-16 NOTE — Progress Notes (Signed)
PROGRESS NOTE  Kristy [name]  TIR:[id number] DOB: 12-13-1945 DOA: 06/10/2021 PCP: Lujean Amel, MD   Brief Narrative: Kristy [name] is a 75 y.o. female with a history of alcohol use who presented to the ED for frequent falls, generalized weakness, and confusion. She was found to be afebrile, hypertensive, and without hypoxia. Also had hypokalemia (K 2.6), SCr 1.88 (above baseline ~1.5), LFT elevation (AST 576, ALT 305). Ammonia normal. EtOH, salicylate, tylenol levels negative. CT head and cervical spine were reassuring, though the patient was unable to rise to seated position from laying prompting consultation for admission. The patient was given potassium supplementation, started on CIWA protocol, and admitted with note made of continued AMS. She has remained weak and confused, though on 8/5 became less interactive, retaining urine, WBC noted to be rising. Blood and urine cultures ordered, ceftriaxone given with bacteriuria and urinary retention. MR brain has not been done yet. Neurology consulted with worsening mental status and nuchal rigidity. LP under fluoro requested, empirically treating meningitis/encephalitis.   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  - Neurology consulted - bedside LP unable to be performed - cultures to be  obtained as above with abscess drainage - MRI remarkable for osteo/epidural abscess as above - Empiric coverage with vancomycin, ampicillin, ceftriaxone 2g, and acyclovir.  - Remains NPO due to mental status/interaction per speech  UTI, POA:  - Patient met sepsis criteria at intake given leukocytosis, tachycardia and source (Suspected UTI/meningitis - now confirmed with overt cervical epidural abscess) - Covered by above abx  Ambulatory dysfunction with fall at home, severe weakness:  - Secondary to above - PT/OT recommending SNF rehabilitation once acute infectious process under better control   Alcoholic hepatitis:  - Based on AST predominance of LFT elevations. Hepatitis panel nonreactive.  - No indication for prednisolone at this time. Continue trending. - AST/ALT downtrending - bilirubin increased marginally    Alcohol abuse:  - Per family - 2 beers per day of late, etoh level negative in ED.  - Continue CIWA. Monitor closely for withdrawal. May DC in AM if no need for ativan. - Cessation counseling to be repeated once pt more clear-headed. - Empirically supplementing thiamine, folate, MVM.   Hypokalemia: - Supplemented with resolution   Hyponatremia: Improved with isotonic saline  HTN: - Continue ARB, and increased norvasc.    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 CK wnl at 138. - SCr improved to putative baseline and now starting to worsen. Give LR bolus and start infusion - Monitor renal function closely with initiation of vancomycin, acyclovir.  Acute urinary retention: Possibly due to UTI. - I/O and serial bladder scan. - After I/O of 1.6L, abdominal exam improved.    HLD: - Hold statin for now.  DVT prophylaxis: Lovenox 38m Code Status: Full Family Communication: At bedside Disposition Plan:  Status is: Inpatient  Remains inpatient appropriate because:Persistent severe electrolyte disturbances, Altered mental status, and Unsafe d/c  plan  Dispo: The patient is from: Home              Anticipated d/c is to: SNF              Patient currently is not medically stable to d/c.   Difficult to place patient No  Consults: Neurology Psychiatry  Procedures:  None  Antimicrobials: Acyclovir, ampicillin, ceftriaxone, vancomycin  Subjective: No acute issues or events overnight, patient somewhat more awake this morning but still somewhat incoherent not following commands appropriately, review of systems limited  Objective: Vitals:   06/15/21 2100 06/15/21 2200 06/15/21 2300 06/16/21 0300  BP: 131/66 136/73 132/71 (!) 145/70  Pulse: 84 84 88 87  Resp: '17 17 18 ' (!) 22  Temp:   98.4 F (36.9 C) 98.2 F (36.8 C)  TempSrc:   Oral Axillary  SpO2: 97% 97% 96% 98%  Weight:      Height:        Intake/Output Summary (Last 24 hours) at 06/16/2021 3664 Last data filed at 06/16/2021 0300 Gross per 24 hour  Intake 2919.45 ml  Output 1350 ml  Net 1569.45 ml    Gen: 75 y.o. female in no distress Pulm: Nonlabored breathing room air. Clear. CV: Regular tachycardia without murmur, or gallop. No JVD or edema. GI: Abdominal exam limited due to pt's withdrawal and yelling with any palpation of any part of her body. She does continue to guard diffusely however.  Ext: Warm, no deformities Skin: No new rashes, lesions or ulcers on visualized skin. Neuro: Asleep but awakes with verbal cues, somewhat noncoherent speech unable to follow commands Psych: Unable to assess  Data Reviewed: I have personally reviewed following labs and imaging studies  CBC: Recent Labs  Lab 06/10/21 2105 06/11/21 0611 06/14/21 1315 06/16/21 0439  WBC 10.8* 13.9* 25.3* 21.0*  NEUTROABS 8.4*  --   --   --   HGB 15.2* 15.8* 13.7 11.8*  HCT 45.3 46.1* 40.7 35.6*  MCV 95.4 94.3 96.0 95.7  PLT 100* 106* 205 403    Basic Metabolic Panel: Recent Labs  Lab 06/11/21 0000 06/11/21 0611 06/12/21 0333 06/12/21 1558 06/13/21 0306 06/16/21 0439  NA   --  132* 136 137 136 154*  K  --  3.3* 2.9* 3.7 4.3 3.2*  CL  --  95* 98 102 100 120*  CO2  --  '25 25 23 22 ' 19*  GLUCOSE  --  86 116* 110* 84 86  BUN  --  35* 31* 31* 40* 80*  CREATININE  --  1.66* 1.46* 1.51* 1.62* 2.44*  CALCIUM  --  8.9 9.2 9.4 9.8 9.1  MG 1.9  --   --   --  2.1  --     GFR: Estimated Creatinine Clearance: 22 mL/min (A) (by C-G formula based on SCr of 2.44 mg/dL (H)). Liver Function Tests: Recent Labs  Lab 06/10/21 2105 06/11/21 0611 06/12/21 0333 06/14/21 2013  AST 576* 425* 138* 120*  ALT 305* 315* 199* 136*  ALKPHOS 101 117 92 104  BILITOT 3.0* 3.0* 3.3* 6.5*  PROT 6.8 6.9 6.4* 6.9  ALBUMIN 2.1* 2.0* 1.8* 1.6*    No results for input(s): LIPASE, AMYLASE in the last 168 hours. Recent Labs  Lab 06/11/21 0000  AMMONIA 25    Coagulation Profile: Recent Labs  Lab 06/10/21 2105  INR 1.2    Cardiac Enzymes: Recent Labs  Lab 06/10/21 2105  CKTOTAL 138    BNP (last 3 results) No results for input(s):  PROBNP in the last 8760 hours. HbA1C: No results for input(s): HGBA1C in the last 72 hours. CBG: Recent Labs  Lab 06/16/21 0050 06/16/21 0606  GLUCAP 92 76   Lipid Profile: No results for input(s): CHOL, HDL, LDLCALC, TRIG, CHOLHDL, LDLDIRECT in the last 72 hours. Thyroid Function Tests: Recent Labs    06/14/21 0303  TSH 3.885    Anemia Panel: Recent Labs    06/14/21 0303 06/14/21 2013  VITAMINB12 674  --   FOLATE  --  16.0    Urine analysis:    Component Value Date/Time   COLORURINE AMBER (A) 06/14/2021 1338   APPEARANCEUR HAZY (A) 06/14/2021 1338   LABSPEC 1.011 06/14/2021 1338   PHURINE 6.0 06/14/2021 1338   GLUCOSEU NEGATIVE 06/14/2021 1338   HGBUR LARGE (A) 06/14/2021 1338   BILIRUBINUR NEGATIVE 06/14/2021 1338   KETONESUR NEGATIVE 06/14/2021 1338   PROTEINUR NEGATIVE 06/14/2021 1338   UROBILINOGEN 0.2 05/14/2008 0020   NITRITE NEGATIVE 06/14/2021 1338   LEUKOCYTESUR NEGATIVE 06/14/2021 1338   Recent Results  (from the past 240 hour(s))  Resp Panel by RT-PCR (Flu A&B, Covid) Nasopharyngeal Swab     Status: None   Collection Time: 06/11/21  3:13 AM   Specimen: Nasopharyngeal Swab; Nasopharyngeal(NP) swabs in vial transport medium  Result Value Ref Range Status   SARS Coronavirus 2 by RT PCR NEGATIVE NEGATIVE Final    Comment: (NOTE) SARS-CoV-2 target nucleic acids are NOT DETECTED.  The SARS-CoV-2 RNA is generally detectable in upper respiratory specimens during the acute phase of infection. The lowest concentration of SARS-CoV-2 viral copies this assay can detect is 138 copies/mL. A negative result does not preclude SARS-Cov-2 infection and should not be used as the sole basis for treatment or other patient management decisions. A negative result may occur with  improper specimen collection/handling, submission of specimen other than nasopharyngeal swab, presence of viral mutation(s) within the areas targeted by this assay, and inadequate number of viral copies(<138 copies/mL). A negative result must be combined with clinical observations, patient history, and epidemiological information. The expected result is Negative.  Fact Sheet for Patients:  EntrepreneurPulse.com.au  Fact Sheet for Healthcare Providers:  IncredibleEmployment.be  This test is no t yet approved or cleared by the [place] FDA and  has been authorized for detection and/or diagnosis of SARS-CoV-2 by FDA under an Emergency Use Authorization (EUA). This EUA will remain  in effect (meaning this test can be used) for the duration of the COVID-19 declaration under Section 564(b)(1) of the Act, 21 U.S.C.section 360bbb-3(b)(1), unless the authorization is terminated  or revoked sooner.       Influenza A by PCR NEGATIVE NEGATIVE Final   Influenza B by PCR NEGATIVE NEGATIVE Final    Comment: (NOTE) The Xpert Xpress SARS-CoV-2/FLU/RSV plus assay is intended as an aid in the  diagnosis of influenza from Nasopharyngeal swab specimens and should not be used as a sole basis for treatment. Nasal washings and aspirates are unacceptable for Xpert Xpress SARS-CoV-2/FLU/RSV testing.  Fact Sheet for Patients: EntrepreneurPulse.com.au  Fact Sheet for Healthcare Providers: IncredibleEmployment.be  This test is not yet approved or cleared by the [place] FDA and has been authorized for detection and/or diagnosis of SARS-CoV-2 by FDA under an Emergency Use Authorization (EUA). This EUA will remain in effect (meaning this test can be used) for the duration of the COVID-19 declaration under Section 564(b)(1) of the Act, 21 U.S.C. section 360bbb-3(b)(1), unless the authorization is terminated or revoked.  Performed at [facility], [place] [address] [place], [place] [address]   Urine Culture     Status: Abnormal (Preliminary result)   Collection Time: 06/14/21  1:28 PM   Specimen: Urine, Clean Catch  Result Value Ref Range Status   Specimen Description URINE, CLEAN CATCH  Final   Special Requests NONE  Final   Culture (A)  Final    >=100,000 COLONIES/mL STAPHYLOCOCCUS AUREUS SUSCEPTIBILITIES TO FOLLOW Performed at [facility], [place] [address] [place], [place] [address]    Report Status PENDING  Incomplete  Culture, blood (routine x 2)     Status: None (Preliminary result)   Collection Time: 06/14/21  7:40 PM   Specimen: BLOOD  Result Value Ref Range Status   Specimen Description BLOOD RIGHT ANTECUBITAL  Final   Special Requests AEROBIC BOTTLE ONLY Blood Culture adequate volume  Final   Culture   Final    NO GROWTH < 24 HOURS Performed at [facility], [place] [address], [place], [place] [address]    Report Status PENDING  Incomplete  Culture, blood (routine x 2)     Status: None (Preliminary result)   Collection Time: 06/14/21  8:13 PM   Specimen: BLOOD RIGHT HAND  Result Value Ref Range Status    Specimen Description BLOOD RIGHT HAND  Final   Special Requests   Final    AEROBIC BOTTLE ONLY Blood Culture results may not be optimal due to an inadequate volume of blood received in culture bottles   Culture   Final    NO GROWTH < 24 HOURS Performed at [facility], [place] [address], [place], [place] [address]    Report Status PENDING  Incomplete      Radiology Studies: CT HEAD WO CONTRAST (5MM)  Result Date: 06/14/2021 CLINICAL DATA:  Mental status changes. EXAM: CT HEAD WITHOUT CONTRAST TECHNIQUE: Contiguous axial images were obtained from the base of the skull through the vertex without intravenous contrast. COMPARISON:  Head CT 06/2021 FINDINGS: Brain: Stable age related cerebral atrophy, ventriculomegaly and periventricular white matter disease. No extra-axial fluid collections are identified. No CT findings for acute hemispheric infarction or intracranial hemorrhage. No mass lesions. The brainstem and cerebellum are normal. Vascular: Stable vascular calcifications.  No hyperdense vessels. Skull: No skull fracture or bone lesions. Sinuses/Orbits: The paranasal sinuses and mastoid air cells are clear. The globes are intact. Other: No scalp lesions or scalp hematoma. IMPRESSION: 1. Stable age related cerebral atrophy, ventriculomegaly and periventricular white matter disease. 2. No acute intracranial findings or mass lesions. Electronically Signed   By: Marijo Sanes M.D.   On: 06/14/2021 17:22   MR BRAIN WO CONTRAST  Result Date: 06/15/2021 CLINICAL DATA:  Mental status change, unknown cause. Concern for meningitis. EXAM: MRI HEAD WITHOUT CONTRAST TECHNIQUE: Multiplanar, multiecho pulse sequences of the brain and surrounding structures were obtained without intravenous contrast. COMPARISON:  Head CT 06/14/2021 FINDINGS: Brain: There is no evidence of an acute infarct, intracranial hemorrhage, mass, midline shift, or extra-axial fluid collection. There is mild-to-moderate cerebral atrophy.  There is confluent T2 hyperintensity along the margins of the occipital and temporal horns of the lateral ventricles suspicious for mild edema. Patchy T2 hyperintensity in the periatrial regions more resembles chronic small vessel ischemia. No definite debris is evident in the ventricles. No sulcal FLAIR signal abnormality is identified. Vascular: Major intracranial vascular flow voids are preserved. Skull and upper cervical spine: No suspicious calvarial lesion. Abnormal intermediate T1 signal material in the midline and  left lateral ventral spinal canal at C2-3, possibly epidural. Sinuses/Orbits: Paranasal sinuses and mastoid air cells are clear. Unremarkable orbits. Other: None. IMPRESSION: 1. Confluent periventricular T2 hyperintensity, indeterminate. Given the clinical concern for infection, this could reflect mild edema from ventriculitis although no ventricular debris is evident and there are no other findings of meningitis on this unenhanced study. 2. Mild chronic small vessel ischemic disease elsewhere in the cerebral white matter. 3. No acute infarct. 4. Abnormal material in the included upper cervical spinal canal of uncertain etiology. Cervical spine MRI is recommended for further evaluation. These results will be called to the ordering clinician or representative by the Radiologist Assistant, and communication documented in the PACS or [employer]. Electronically Signed   By: Logan Bores M.D.   On: 06/15/2021 19:16   MR CERVICAL SPINE WO CONTRAST  Result Date: 06/16/2021 CLINICAL DATA:  Cervical radiculopathy. Infection suspected. Elevated CRP and leukocytosis. EXAM: MRI CERVICAL, THORACIC AND LUMBAR SPINE WITHOUT CONTRAST TECHNIQUE: Multiplanar and multiecho pulse sequences of the cervical spine, to include the craniocervical junction and cervicothoracic junction, and thoracic and lumbar spine, were obtained without intravenous contrast. COMPARISON:  Cervical spine CT from admission FINDINGS:  MRI CERVICAL SPINE FINDINGS Alignment: Exaggerated cervical lordosis with mild C7-T1 anterolisthesis. Vertebrae: Subtle marrow edema at the C5-C7 vertebral levels. There is disc T2 hyperintensity at C5-6 and C6-7 with extensive prevertebral edema and posterior-lateral paravertebral edema. Cord: Multilevel deformity from epidural collection extending from approximately C2-T4, more inferiorly extensive posteriorly. No detected cord edema Posterior Fossa, vertebral arteries, paraspinal tissues: As above Disc levels: Given patient motion, evaluation of degenerative disease is limited. There is diffuse degenerative facet spurring. Milder disc degeneration with generalized narrowing and bulging. MRI THORACIC SPINE FINDINGS Alignment:  Exaggerated thoracic kyphosis. Vertebrae: T1-2 disc and adjacent endplate hyperintensity. Cord: Normal signal and morphology. Epidural collection continues from the cervical levels into the upper thoracic spine, posteriorly terminating at approximately T3-4. Paraspinal and other soft tissues: Paravertebral edema at the upper thoracic levels related to cervical spine disease. Disc levels: Generalize bridging osteophytes and ankylosis. Upper thoracic degenerative facet spurring. No canal or foraminal impingement MRI LUMBAR SPINE FINDINGS Segmentation:  5 lumbar type vertebrae Alignment:  Mild degenerative anterolisthesis at L4-5 Vertebrae: No visible fracture, discitis, or aggressive bone lesion. Conus medullaris and cauda equina: Conus extends to the T12-L1 level. Conus and cauda equina appear normal. Paraspinal and other soft tissues: Cystic appearing spaces in the presacral space on the right intimately associated with piriformis. The sacroiliac joints are largely on covered. No visible adjacent marrow edema Disc levels: T12- L1: Disc narrowing and bulging.  Mild facet spurring L1-L2: Disc narrowing and bulging.  Mild facet spurring L2-L3: Disc narrowing and bulging with degenerative facet  spurring. Left subarticular recess stenosis that could affect the L3 nerve root L3-L4: Disc narrowing and bulging with degenerative facet spurring L4-L5: Facet osteoarthritis with spurring and anterolisthesis. The disc is narrowed and bulging. Moderate spinal stenosis. L5-S1:Facet osteoarthritis with bulky spurring. Mild disc space narrowing. Case discussed with Dr. Leonel Ramsay. IMPRESSION: 1. 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 2. Suspected inflammation/collection partially covered in the right pelvis along the piriformis, possible septic arthritis of the right sacroiliac joint which is only minimally covered. Recommend dedicated pelvis MRI. Electronically Signed   By: Monte Fantasia M.D.   On: 06/16/2021 04:44   MR THORACIC SPINE WO CONTRAST  Result Date: 06/16/2021 CLINICAL DATA:  Cervical radiculopathy. Infection suspected. Elevated CRP and leukocytosis. EXAM: MRI CERVICAL, THORACIC AND LUMBAR SPINE WITHOUT CONTRAST TECHNIQUE: Multiplanar and multiecho pulse sequences of the cervical spine, to include the craniocervical junction and cervicothoracic junction, and thoracic and lumbar spine, were obtained without intravenous contrast. COMPARISON:  Cervical spine CT from admission FINDINGS: MRI CERVICAL SPINE FINDINGS Alignment: Exaggerated cervical lordosis with mild C7-T1 anterolisthesis. Vertebrae: Subtle marrow edema at the C5-C7 vertebral levels. There is disc T2 hyperintensity at C5-6 and C6-7 with extensive prevertebral edema and posterior-lateral paravertebral edema. Cord: Multilevel deformity from epidural collection extending from approximately C2-T4, more inferiorly extensive posteriorly. No detected cord edema Posterior Fossa, vertebral arteries, paraspinal tissues: As above Disc levels: Given patient motion, evaluation of degenerative disease is limited. There is diffuse degenerative  facet spurring. Milder disc degeneration with generalized narrowing and bulging. MRI THORACIC SPINE FINDINGS Alignment:  Exaggerated thoracic kyphosis. Vertebrae: T1-2 disc and adjacent endplate hyperintensity. Cord: Normal signal and morphology. Epidural collection continues from the cervical levels into the upper thoracic spine, posteriorly terminating at approximately T3-4. Paraspinal and other soft tissues: Paravertebral edema at the upper thoracic levels related to cervical spine disease. Disc levels: Generalize bridging osteophytes and ankylosis. Upper thoracic degenerative facet spurring. No canal or foraminal impingement MRI LUMBAR SPINE FINDINGS Segmentation:  5 lumbar type vertebrae Alignment:  Mild degenerative anterolisthesis at L4-5 Vertebrae: No visible fracture, discitis, or aggressive bone lesion. Conus medullaris and cauda equina: Conus extends to the T12-L1 level. Conus and cauda equina appear normal. Paraspinal and other soft tissues: Cystic appearing spaces in the presacral space on the right intimately associated with piriformis. The sacroiliac joints are largely on covered. No visible adjacent marrow edema Disc levels: T12- L1: Disc narrowing and bulging.  Mild facet spurring L1-L2: Disc narrowing and bulging.  Mild facet spurring L2-L3: Disc narrowing and bulging with degenerative facet spurring. Left subarticular recess stenosis that could affect the L3 nerve root L3-L4: Disc narrowing and bulging with degenerative facet spurring L4-L5: Facet osteoarthritis with spurring and anterolisthesis. The disc is narrowed and bulging. Moderate spinal stenosis. L5-S1:Facet osteoarthritis with bulky spurring. Mild disc space narrowing. Case discussed with Dr. Leonel Ramsay. IMPRESSION: 1. 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 2. Suspected inflammation/collection partially covered in the right  pelvis along the piriformis, possible septic arthritis of the right sacroiliac joint which is only minimally covered. Recommend dedicated pelvis MRI. Electronically Signed   By: Monte Fantasia M.D.   On: 06/16/2021 04:44   MR LUMBAR SPINE WO CONTRAST  Result Date: 06/16/2021 CLINICAL DATA:  Cervical radiculopathy. Infection suspected. Elevated CRP and leukocytosis. EXAM: MRI CERVICAL, THORACIC AND LUMBAR SPINE WITHOUT CONTRAST TECHNIQUE: Multiplanar and multiecho pulse sequences of the cervical spine, to include the craniocervical junction and cervicothoracic junction, and thoracic and lumbar spine, were obtained without intravenous contrast. COMPARISON:  Cervical spine CT from admission FINDINGS: MRI CERVICAL SPINE FINDINGS Alignment: Exaggerated cervical lordosis with mild C7-T1 anterolisthesis. Vertebrae: Subtle marrow edema at the C5-C7 vertebral levels. There is disc T2 hyperintensity at C5-6 and C6-7 with extensive prevertebral edema and posterior-lateral paravertebral edema. Cord: Multilevel deformity from epidural collection extending from approximately C2-T4, more inferiorly extensive posteriorly. No detected cord edema Posterior Fossa, vertebral arteries, paraspinal tissues: As above Disc levels: Given patient motion, evaluation of degenerative disease is limited. There is diffuse degenerative facet spurring. Milder disc degeneration with generalized narrowing and bulging. MRI THORACIC SPINE FINDINGS Alignment:  Exaggerated  thoracic kyphosis. Vertebrae: T1-2 disc and adjacent endplate hyperintensity. Cord: Normal signal and morphology. Epidural collection continues from the cervical levels into the upper thoracic spine, posteriorly terminating at approximately T3-4. Paraspinal and other soft tissues: Paravertebral edema at the upper thoracic levels related to cervical spine disease. Disc levels: Generalize bridging osteophytes and ankylosis. Upper thoracic degenerative facet spurring. No canal or  foraminal impingement MRI LUMBAR SPINE FINDINGS Segmentation:  5 lumbar type vertebrae Alignment:  Mild degenerative anterolisthesis at L4-5 Vertebrae: No visible fracture, discitis, or aggressive bone lesion. Conus medullaris and cauda equina: Conus extends to the T12-L1 level. Conus and cauda equina appear normal. Paraspinal and other soft tissues: Cystic appearing spaces in the presacral space on the right intimately associated with piriformis. The sacroiliac joints are largely on covered. No visible adjacent marrow edema Disc levels: T12- L1: Disc narrowing and bulging.  Mild facet spurring L1-L2: Disc narrowing and bulging.  Mild facet spurring L2-L3: Disc narrowing and bulging with degenerative facet spurring. Left subarticular recess stenosis that could affect the L3 nerve root L3-L4: Disc narrowing and bulging with degenerative facet spurring L4-L5: Facet osteoarthritis with spurring and anterolisthesis. The disc is narrowed and bulging. Moderate spinal stenosis. L5-S1:Facet osteoarthritis with bulky spurring. Mild disc space narrowing. Case discussed with Dr. Leonel Ramsay. IMPRESSION: 1. 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 2. Suspected inflammation/collection partially covered in the right pelvis along the piriformis, possible septic arthritis of the right sacroiliac joint which is only minimally covered. Recommend dedicated pelvis MRI. Electronically Signed   By: Monte Fantasia M.D.   On: 06/16/2021 04:44   DG CHEST PORT 1 VIEW  Result Date: 06/14/2021 CLINICAL DATA:  Leukocytosis EXAM: PORTABLE CHEST 1 VIEW COMPARISON:  06/11/2020, 05/14/2008 FINDINGS: The heart size and mediastinal contours are within normal limits. Both lungs are clear. The visualized skeletal structures are unremarkable. IMPRESSION: No active disease. Electronically Signed   By: Donavan Foil M.D.   On: 06/14/2021 18:30     Scheduled Meds:  allopurinol  300 mg Oral Daily   amLODipine  10 mg Oral Daily   aspirin  81 mg Oral Daily   Chlorhexidine Gluconate Cloth  6 each Topical Daily   folic acid  1 mg Oral Daily   irbesartan  150 mg Oral Daily   multivitamin with minerals  1 tablet Oral Daily   [START ON 06/22/2021] thiamine injection  100 mg Intravenous Daily   Continuous Infusions:  sodium chloride 100 mL/hr at 06/15/21 2313   acyclovir (ZOVIRAX) [id number] mg IVPB 595 mg (06/15/21 2304)   ampicillin (OMNIPEN) IV 2 g (06/16/21 0442)   cefTRIAXone (ROCEPHIN)  IV 2 g (06/15/21 2202)   thiamine injection     vancomycin       LOS: 5 days   Time spent: 35 minutes.  Little Ishikawa, DO [facility] Pager: Secure chat  06/16/2021, 7:28 AM

## 2021-06-16 NOTE — Consult Note (Signed)
[facility]                                                                                        Patient Identification: Patient Name: Kristy [name] MRN: [id number] Admit Date: 06/10/2021  8:56 PM Today's Date: 06/16/2021 Reason for consult: epidural abscess  Requesting provider: Carma LeavenWilliam [place]   Principal Problem:   Acute delirium Active Problems:   Fall   Alcoholic hepatitis   Altered mental status   Sepsis (HCC)   Antibiotics:  Acyclovir 8/5-current Ampicillin 8/5 -current  Ceftriaxone 8/5-current Vancomycin 8/6-current   Lines/Tubes: PIV external uretheral catheter  Assessment Encephalopathy 2/2 below Cervical -Thoracic discitis and Osteomyelitis ( C5-C6, C6-C7, T1-T2) Cervical epidural abscess extending from C2 to T3-T4 RT SI joint Septic arthritis/Inflammatory collection in the rt pelvis  Urine cx positive for staph aureus  Transaminitis - acute hep panel negative, in the setting of sepsis  Recommendations  Continue current antibiotics pending NeuroSX/LP. DC acyclovir given less likely viral meningitis given clinical picture and non supportive MRI brain findings Follow up blood cultures Monitor CBC, CMP and vancomycin trough TTE ( ordered ) Pelvis MRI  when feasibe  Please send tissue/sample for gram stain and cultures if patient goes to OR.  Following   Discussed with primary  Rest of the management as per the primary team. Please call with questions or concerns.  Thank you for the consult  Odette FractionSabina Vale Peraza, MD Infectious Disease Physician [facility]  [facility] [address] [place], [place] [address] Phone: [phone]  Fax: [phone]  __________________________________________________________________________________________________________ HPI and Hospital Course: 75 year old female with PMH of hypertension,  polycythemia, alcohol use disorder who presented to the ED on 8/2 with 2 reported falls at home after tripping.  No reported history of fevers at home.  Chest x-ray, CT head and C-spine was unremarkable on admission. History obtained from chart review as patient is currently obtunded.   At ED, patient was afebrile, WBC was elevated to 13.9 Labs was remarkable for NA 128, K2.6, CR 1.88, AST 576, ALT 305, TB 3, CD138 Urine culture 8/5 staph aureus  Blood culture 8/five 2/2 sets growth in 24 hours.  Patient continued to remain weak and confused, found to be less interactive 8/5 retaining urine, with uptrending leukocytosis.  Blood and urine cultures obtained and started on ceftriaxone given concerns of UTI.  Patient was empirically started on vancomycin, ceftriaxone, ampicillin and acyclovir for concern for bacterial meningitis.  Neurology has been consulted, difficult LP.  IR has been consulted for fluoroscopy guided LP. NeuroSx consulted for MRI findings suggestive of epidural abscess/discitis and osteomyelitis   ROS: unavailable as patient is altered, confused and moaning   No past medical history on file.  No past surgical history on file.   Scheduled Meds:  allopurinol  300 mg Oral Daily   amLODipine  10 mg Oral Daily   aspirin  81 mg Oral Daily   Chlorhexidine Gluconate Cloth  6 each Topical Daily   folic acid  1 mg Oral Daily   irbesartan  150 mg Oral Daily   multivitamin with minerals  1 tablet Oral Daily   [START ON 06/22/2021] thiamine injection  100 mg Intravenous Daily   Continuous Infusions:  sodium chloride Stopped (06/16/21 0757)   acyclovir (ZOVIRAX) [phone] mg IVPB 595 mg (06/15/21 2304)   ampicillin (OMNIPEN) IV 2 g (06/16/21 0442)   cefTRIAXone (ROCEPHIN)  IV 2 g (06/15/21 2202)   thiamine injection     vancomycin     PRN Meds:.acetaminophen, lidocaine (PF), LORazepam **AND** LORazepam, melatonin, naLOXone (NARCAN)  injection, ondansetron (ZOFRAN) IV, polyethylene  glycol  No Known Allergies  Social History   Socioeconomic History   Marital status: Married    Spouse name: Not on file   Number of children: Not on file   Years of education: Not on file   Highest education level: Not on file  Occupational History   Not on file  Tobacco Use   Smoking status: Not on file   Smokeless tobacco: Not on file  Substance and Sexual Activity   Alcohol use: Not on file   Drug use: Not on file   Sexual activity: Not on file  Other Topics Concern   Not on file  Social History Narrative   Not on file   Social Determinants of Health   Financial Resource Strain: Not on file  Food Insecurity: Not on file  Transportation Needs: Not on file  Physical Activity: Not on file  Stress: Not on file  Social Connections: Not on file  Intimate Partner Violence: Not on file    Vitals BP (!) 145/101 (BP Location: Right Arm)   Pulse 87   Temp 98.2 F (36.8 C) (Axillary)   Resp (!) 22   Ht 5\' 6"  (1.676 m)   Wt 85.5 kg   SpO2 98%   BMI 30.42 kg/m    Physical Exam Constitutional:  Patient is lying in the bed, confused, mumbling     Comments:   Cardiovascular:     Rate and Rhythm: Normal rate and regular rhythm.     Heart sounds: systolic murmur  Pulmonary:     Effort: Pulmonary effort is normal.     Comments: Room air   Abdominal:     Palpations: Abdomen is soft.     Tenderness: Non tender  Musculoskeletal:        General: no peripheral joint swelling/tenderness  Skin:    Comments: No lesions or rashes   Neurological:     General: limited exam, confused   Psychiatric:        Mood and Affect: cannot be assessed   Pertinent Microbiology Results for orders placed or performed during the hospital encounter of 06/10/21  Resp Panel by RT-PCR (Flu A&B, Covid) Nasopharyngeal Swab     Status: None   Collection Time: 06/11/21  3:13 AM   Specimen: Nasopharyngeal Swab; Nasopharyngeal(NP) swabs in vial transport medium  Result Value Ref Range  Status   SARS Coronavirus 2 by RT PCR NEGATIVE NEGATIVE Final    Comment: (NOTE) SARS-CoV-2 target nucleic acids are NOT DETECTED.  The SARS-CoV-2 RNA is generally detectable in upper respiratory specimens during the acute phase of infection. The lowest concentration of SARS-CoV-2 viral copies this assay can detect is 138 copies/mL. A negative result does not preclude SARS-Cov-2 infection and should not be used as the sole basis for treatment or other patient management decisions. A negative result may occur with  improper specimen collection/handling, submission of specimen other than nasopharyngeal swab, presence of viral mutation(s) within the areas targeted by this assay, and inadequate number  of viral copies(<138 copies/mL). A negative result must be combined with clinical observations, patient history, and epidemiological information. The expected result is Negative.  Fact Sheet for Patients:  BloggerCourse.com  Fact Sheet for Healthcare Providers:  SeriousBroker.it  This test is no t yet approved or cleared by the [place] FDA and  has been authorized for detection and/or diagnosis of SARS-CoV-2 by FDA under an Emergency Use Authorization (EUA). This EUA will remain  in effect (meaning this test can be used) for the duration of the COVID-19 declaration under Section 564(b)(1) of the Act, 21 U.S.C.section 360bbb-3(b)(1), unless the authorization is terminated  or revoked sooner.       Influenza A by PCR NEGATIVE NEGATIVE Final   Influenza B by PCR NEGATIVE NEGATIVE Final    Comment: (NOTE) The Xpert Xpress SARS-CoV-2/FLU/RSV plus assay is intended as an aid in the diagnosis of influenza from Nasopharyngeal swab specimens and should not be used as a sole basis for treatment. Nasal washings and aspirates are unacceptable for Xpert Xpress SARS-CoV-2/FLU/RSV testing.  Fact Sheet for  Patients: BloggerCourse.com  Fact Sheet for Healthcare Providers: SeriousBroker.it  This test is not yet approved or cleared by the [place] FDA and has been authorized for detection and/or diagnosis of SARS-CoV-2 by FDA under an Emergency Use Authorization (EUA). This EUA will remain in effect (meaning this test can be used) for the duration of the COVID-19 declaration under Section 564(b)(1) of the Act, 21 U.S.C. section 360bbb-3(b)(1), unless the authorization is terminated or revoked.  Performed at [facility], [address] [place], [place] [address]   Urine Culture     Status: Abnormal (Preliminary result)   Collection Time: 06/14/21  1:28 PM   Specimen: Urine, Clean Catch  Result Value Ref Range Status   Specimen Description URINE, CLEAN CATCH  Final   Special Requests NONE  Final   Culture (A)  Final    >=100,000 COLONIES/mL STAPHYLOCOCCUS AUREUS SUSCEPTIBILITIES TO FOLLOW Performed at [facility], [address], [place], [place] [address]    Report Status PENDING  Incomplete  Culture, blood (routine x 2)     Status: None (Preliminary result)   Collection Time: 06/14/21  7:40 PM   Specimen: BLOOD  Result Value Ref Range Status   Specimen Description BLOOD RIGHT ANTECUBITAL  Final   Special Requests AEROBIC BOTTLE ONLY Blood Culture adequate volume  Final   Culture   Final    NO GROWTH < 24 HOURS Performed at [facility], [address] [place], [place] [address]    Report Status PENDING  Incomplete  Culture, blood (routine x 2)     Status: None (Preliminary result)   Collection Time: 06/14/21  8:13 PM   Specimen: BLOOD RIGHT HAND  Result Value Ref Range Status   Specimen Description BLOOD RIGHT HAND  Final   Special Requests   Final    AEROBIC BOTTLE ONLY Blood Culture results may not be optimal due to an inadequate volume of blood received in culture bottles   Culture   Final    NO  GROWTH < 24 HOURS Performed at [facility], [address] [place], [place] [address]    Report Status PENDING  Incomplete      Pertinent Lab seen by me: CBC Latest Ref Rng & Units 06/16/2021 06/14/2021 06/11/2021  WBC 4.0 - 10.5 K/uL 21.0(H) 25.3(H) 13.9(H)  Hemoglobin 12.0 - 15.0 g/dL 11.8(L) 13.7 15.8(H)  Hematocrit 36.0 -  46.0 % 35.6(L) 40.7 46.1(H)  Platelets 150 - 400 K/uL 176 205 106(L)   CMP Latest Ref Rng & Units 06/16/2021 06/14/2021 06/13/2021  Glucose 70 - 99 mg/dL 86 - 84  BUN 8 - 23 mg/dL 84(O) - 96(E)  Creatinine 0.44 - 1.00 mg/dL 9.52(W) - 4.13(K)  Sodium 135 - 145 mmol/L 154(H) - 136  Potassium 3.5 - 5.1 mmol/L 3.2(L) - 4.3  Chloride 98 - 111 mmol/L 120(H) - 100  CO2 22 - 32 mmol/L 19(L) - 22  Calcium 8.9 - 10.3 mg/dL 9.1 - 9.8  Total Protein 6.5 - 8.1 g/dL - 6.9 -  Total Bilirubin 0.3 - 1.2 mg/dL - 6.5(H) -  Alkaline Phos 38 - 126 U/L - 104 -  AST 15 - 41 U/L - 120(H) -  ALT 0 - 44 U/L - 136(H) -    Pertinent Imagings/Other Imagings Plain films and CT images have been personally visualized and interpreted; radiology reports have been reviewed. Decision making incorporated into the Impression / Recommendations.  MRI brain8/6/22  IMPRESSION: 1. Confluent periventricular T2 hyperintensity, indeterminate. Given the clinical concern for infection, this could reflect mild edema from ventriculitis although no ventricular debris is evident and there are no other findings of meningitis on this unenhanced study. 2. Mild chronic small vessel ischemic disease elsewhere in the cerebral white matter. 3. No acute infarct. 4. Abnormal material in the included upper cervical spinal canal of uncertain etiology. Cervical spine MRI is recommended for further evaluation.  MRI CTL spine 06/15/21 IMPRESSION: 1. 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 2. Suspected  inflammation/collection partially covered in the right pelvis along the piriformis, possible septic arthritis of the right sacroiliac joint which is only minimally covered. Recommend dedicated pelvis MRI.  Chest xray 06/14/21 FINDINGS: The heart size and mediastinal contours are within normal limits. Both lungs are clear. The visualized skeletal structures are unremarkable.   IMPRESSION: No active disease.  CT Head c spine 06/14/21 IMPRESSION: 1. Stable age related cerebral atrophy, ventriculomegaly and periventricular white matter disease. 2. No acute intracranial findings or mass lesions.  CT head /c spine WO contrast 06/11/21 IMPRESSION: 1. No acute intracranial abnormality. 2. No acute displaced fracture or traumatic listhesis of the cervical spine.   Chest Xray 06/11/21 FINDINGS: Relative opacification of the left hemithorax is likely artifactual and related to rotation and overlying soft tissue on this examination. The lungs are clear. No pneumothorax or pleural effusion. Cardiac size is within normal limits. No acute bone abnormality.   IMPRESSION: No active disease.  I spent more than 70  minutes for this patient encounter including review of prior medical records/discussing diagnostics and treatment plan with the patient/family/coordinate care with primary/other specialits with greater than 50% of time in face to face encounter.   Electronically signed by:   Odette Fraction, MD Infectious Disease Physician [facility] Pager: [phone]

## 2021-06-16 NOTE — Plan of Care (Signed)
°  Problem: Education: °Goal: Knowledge of General Education information will improve °Description: Including pain rating scale, medication(s)/side effects and non-pharmacologic comfort measures °Outcome: Not Progressing °  °Problem: Health Behavior/Discharge Planning: °Goal: Ability to manage health-related needs will improve °Outcome: Not Progressing °  °Problem: Clinical Measurements: °Goal: Ability to maintain clinical measurements within normal limits will improve °Outcome: Not Progressing °Goal: Will remain free from infection °Outcome: Not Progressing °Goal: Diagnostic test results will improve °Outcome: Not Progressing °Goal: Respiratory complications will improve °Outcome: Not Progressing °Goal: Cardiovascular complication will be avoided °Outcome: Not Progressing °  °Problem: Nutrition: °Goal: Adequate nutrition will be maintained °Outcome: Not Progressing °  °Problem: Coping: °Goal: Level of anxiety will decrease °Outcome: Not Progressing °  °Problem: Elimination: °Goal: Will not experience complications related to bowel motility °Outcome: Not Progressing °Goal: Will not experience complications related to urinary retention °Outcome: Not Progressing °  °Problem: Pain Managment: °Goal: General experience of comfort will improve °Outcome: Not Progressing °  °Problem: Safety: °Goal: Ability to remain free from injury will improve °Outcome: Not Progressing °  °

## 2021-06-16 NOTE — Progress Notes (Signed)
SLP Cancellation Note  Patient Details Name: Kristy [name] MRN: [id number] DOB: 05-18-1946   Cancelled treatment:       Reason Eval/Treat Not Completed: Other (comment) Pt NPO for upcoming procedure. RN states mentation also remains limiting. Will continue to follow for PO readiness following procedure completion   Ardyth Gal MA, CCC-SLP Acute Rehabilitation Services   06/16/2021, 8:20 AM

## 2021-06-16 NOTE — Anesthesia Procedure Notes (Signed)
Procedure Name: Intubation Date/Time: 06/16/2021 11:50 AM Performed by: Lelon Perla, CRNA Pre-anesthesia Checklist: Patient identified, Emergency Drugs available, Suction available and Patient being monitored Patient Re-evaluated:Patient Re-evaluated prior to induction Oxygen Delivery Method: Circle system utilized Preoxygenation: Pre-oxygenation with 100% oxygen Induction Type: IV induction Ventilation: Mask ventilation without difficulty Laryngoscope Size: Glidescope and 3 Grade View: Grade I Tube type: Oral Tube size: 7.0 mm Number of attempts: 1 Airway Equipment and Method: Stylet, Oral airway and Video-laryngoscopy Placement Confirmation: ETT inserted through vocal cords under direct vision, positive ETCO2 and breath sounds checked- equal and bilateral Secured at: 21 cm Tube secured with: Tape Dental Injury: Teeth and Oropharynx as per pre-operative assessment

## 2021-06-16 NOTE — Transfer of Care (Signed)
Immediate Anesthesia Transfer of Care Note  Patient: Kristy [name]  Procedure(s) Performed: ANTERIOR CERVICAL DISCECTOMY FUSION Cervical five -Cervical six, Evacuation of Epidural Abscess (Neck)  Patient Location: PACU  Anesthesia Type:General  Level of Consciousness: awake and patient cooperative  Airway & Oxygen Therapy: Patient Spontanous Breathing  Post-op Assessment: Report given to RN and Post -op Vital signs reviewed and stable  Post vital signs: Reviewed and stable  Last Vitals:  Vitals Value Taken Time  BP    Temp    Pulse 74 06/16/21 1345  Resp 17 06/16/21 1345  SpO2 98 % 06/16/21 1345  Vitals shown include unvalidated device data.  Last Pain:  Vitals:   06/16/21 0300  TempSrc: Axillary  PainSc:          Complications: No notable events documented.

## 2021-06-16 NOTE — Anesthesia Postprocedure Evaluation (Signed)
Anesthesia Post Note  Patient: BRINN WESTBY  Procedure(s) Performed: ANTERIOR CERVICAL DISCECTOMY FUSION Cervical five -Cervical six, Evacuation of Epidural Abscess (Neck)     Patient location during evaluation: PACU Anesthesia Type: General Level of consciousness: awake and alert Pain management: pain level controlled Vital Signs Assessment: post-procedure vital signs reviewed and stable Respiratory status: spontaneous breathing, nonlabored ventilation and respiratory function stable Cardiovascular status: blood pressure returned to baseline and stable Postop Assessment: no apparent nausea or vomiting Anesthetic complications: no   No notable events documented.  Last Vitals:  Vitals:   06/16/21 1412 06/16/21 1621  BP: 125/81 (!) 165/91  Pulse: 72   Resp: 16   Temp: (!) 36.2 C   SpO2: 98%     Last Pain:  Vitals:   06/16/21 0300  TempSrc: Axillary  PainSc:                  Ralphine Hinks,W. EDMOND

## 2021-06-16 NOTE — Consult Note (Addendum)
Neurosurgery Consultation  Reason for Consult: Cervical epidural abscess Referring Physician: Natale Milch  CC: Pain  HPI: This is a 75 y.o. woman that presents with confusion and generalized weakness. On my exam this morning, she is altered and unable to provide any history, just complains of pain whenever any of her limbs are moved, does not answer questions. No family available at bedside for further history, review of her EMR notes shows that she has a h/o EtOH use/abuse, has been on Abx to cover for encephalitis / meningitis, got B1 and folate, +bacteruria. Per primary team, family states she is normally ambulatory and interactive.  ROS: A 14 point ROS was performed and is negative except as noted in the HPI but severely limited due to pt's mental status  PMHx: No past medical history on file. FamHx: No family history on file. SocHx:  has no history on file for tobacco use, alcohol use, and drug use.  Exam: Vital signs in last 24 hours: Temp:  [98.1 F (36.7 C)-98.5 F (36.9 C)] 98.2 F (36.8 C) (08/07 0300) Pulse Rate:  [82-114] 87 (08/07 0300) Resp:  [16-24] 22 (08/07 0300) BP: (106-145)/(56-101) 145/101 (08/07 0804) SpO2:  [95 %-99 %] 98 % (08/07 0300) Weight:  [85.3 kg] 85.3 kg (08/07 0145) General: Somnolent, laying in hospital bed, appears acute on chronically ill Head: Normocephalic and atruamatic HEENT: Neck mildly stiff but pt has diffusely inc'd tone due to pain limitation Pulmonary: breathing supplemental O2 comfortably, no evidence of increased work of breathing Cardiac: mildly tachycardic Abdomen: S NT ND Extremities: Warm and well perfused x4, tender to any manipulation Neuro: Somnolent, Ox1, knows it's August but not the year or place PERRL, gaze conjugate, face grossly symmetric but unable to perform formal strength testing 2/2 AMS, w/d x4 equally with painful cries whenever any of her limbs are touched. Again difficult to do reflex testing with this issue but at  the patellae pretty convincing 1+ reflexes and no obvious hoffman's  Assessment and Plan: 75 y.o. woman w/ AMS and generalized weakness. MRI C-spine personally reviewed, which shows discitis at C5-6/C6-7 with associated epidural abscess from C2 down to T4.   -I would recommend surgical evacuation of this abscess via ACDF, likely only C5-6 but potentially also C6-7 depending on intra-operative findings, will place on the schedule for this morning, keep NPO -discussed w/ the patient's daughter, who would like to proceed with surgery  Jadene Pierini, MD 06/16/21 8:31 AM [facility]

## 2021-06-16 NOTE — Anesthesia Preprocedure Evaluation (Addendum)
Anesthesia Evaluation  Patient identified by MRN, date of birth, ID band Patient confused    Reviewed: Allergy & Precautions, H&P , NPO status , Patient's Chart, lab work & pertinent test results  Airway Mallampati: III  TM Distance: >3 FB Neck ROM: Full    Dental no notable dental hx. (+) Edentulous Upper, Edentulous Lower, Dental Advisory Given   Pulmonary neg pulmonary ROS,    Pulmonary exam normal breath sounds clear to auscultation       Cardiovascular hypertension, Pt. on medications  Rhythm:Regular Rate:Normal     Neuro/Psych negative neurological ROS  negative psych ROS   GI/Hepatic negative GI ROS, (+)     substance abuse  alcohol use,   Endo/Other  negative endocrine ROS  Renal/GU negative Renal ROS  negative genitourinary   Musculoskeletal   Abdominal   Peds  Hematology negative hematology ROS (+)   Anesthesia Other Findings   Reproductive/Obstetrics negative OB ROS                            Anesthesia Physical Anesthesia Plan  ASA: 3  Anesthesia Plan: General   Post-op Pain Management:    Induction: Intravenous  PONV Risk Score and Plan: 4 or greater and Ondansetron, Dexamethasone and Treatment may vary due to age or medical condition  Airway Management Planned: Oral ETT and Video Laryngoscope Planned  Additional Equipment:   Intra-op Plan:   Post-operative Plan: Possible Post-op intubation/ventilation  Informed Consent: I have reviewed the patients History and Physical, chart, labs and discussed the procedure including the risks, benefits and alternatives for the proposed anesthesia with the patient or authorized representative who has indicated his/her understanding and acceptance.     Dental advisory given and Consent reviewed with POA  Plan Discussed with: CRNA  Anesthesia Plan Comments:        Anesthesia Quick Evaluation

## 2021-06-16 NOTE — Progress Notes (Signed)
Pt transported to MRI 

## 2021-06-16 NOTE — Op Note (Signed)
PATIENT: Kristy [name]  PROCEDURE DATE: 06/16/21  PRE-OPERATIVE DIAGNOSIS:  Cervical epidural abscess   POST-OPERATIVE DIAGNOSIS:  Cervical epidural abscess, discitis, paravertebral abscess   PROCEDURE:  C5-C6 Anterior Cervical Discectomy and Instrumented Fusion   SURGEON:  Surgeon(s) and Role:    Jadene Pierini, MD - Primary   ANESTHESIA: ETGA   BRIEF HISTORY: This is a 75 year old woman who presented with progressive global decline. The patient was found to have a large cervical epidural abscess with discitis. I therefore recommended drainage. Given the ventral location, I recommended performing this via an ACDF at the level of discitis. This was discussed with the patient's family, given her altered mental status, as well as risks, benefits, and alternatives and they wished to proceed with surgical treatment.   OPERATIVE DETAIL: The patient was taken to the operating room and placed on the OR table in the supine position. A formal time out was performed with two patient identifiers and confirmed the operative site. Anesthesia was induced by the anesthesia team.  Fluoroscopy was used to localize the surgical level and an incision was marked in a skin crease. The area was then prepped and draped in a sterile fashion. A transverse linear incision was made on the right side of the neck. The platysma was divided and the sternocleidomastoid muscle was identified. The carotid sheath was palpated, identified, and retracted laterally with the sternocleidomastoid muscle. The strap muscles were identified and retracted medially and the pretracheal fascia was entered. A bent spinal needle was used with fluoroscopy to localize the surgical level after dissection. The longus colli were elevated bilaterally and a self-retaining retractor was placed. The endotracheal tube cuff balloon was deflated and reinflated after retractor placement.   As soon as I placed the retractor under longus, I immediately got  the return of copious amounts of pus and sent cultures x2. This was followed down to the disc space, fluoro confirmed this was C5-6. The disc was essentially gone with just pus and flegmon. I removed what little remained, the osteophytes, and prepped the endplates, then continued until opening the PLL, getting more pus out along the way. I used a micro nerve hook to probe the epidural space without any residual purulent material draining, obtained hemostasis, sized and placed an interbody cortical graft, and an anterior plate ([employer]) was positioned and 4, 93mm screws were used to secure the plate to the C5 and C6 vertebral bodies. Despite the impressive infection, the screw purchase was actually quite good. The screws were locked, the wound was copiously irrigated with sterile saline, hemostasis was obtained and the incision was closed in layers. All instrument and sponge counts were correct. The patient was then returned to anesthesia for emergence. No apparent complications at the completion of the procedure.   EBL:  77mL   DRAINS: none   SPECIMENS: Epidural pus cultures x2   Jadene Pierini, MD 06/16/21 1:31 PM

## 2021-06-17 ENCOUNTER — Encounter (HOSPITAL_COMMUNITY): Payer: Self-pay | Admitting: Neurological Surgery

## 2021-06-17 ENCOUNTER — Inpatient Hospital Stay (HOSPITAL_COMMUNITY): Payer: Medicare Other

## 2021-06-17 LAB — BASIC METABOLIC PANEL
Anion gap: 13 (ref 5–15)
BUN: 79 mg/dL — ABNORMAL HIGH (ref 8–23)
CO2: 21 mmol/L — ABNORMAL LOW (ref 22–32)
Calcium: 9 mg/dL (ref 8.9–10.3)
Chloride: 124 mmol/L — ABNORMAL HIGH (ref 98–111)
Creatinine, Ser: 2.19 mg/dL — ABNORMAL HIGH (ref 0.44–1.00)
GFR, Estimated: 23 mL/min — ABNORMAL LOW (ref >60)
Glucose, Bld: 84 mg/dL (ref 70–99)
Potassium: 2.8 mmol/L — ABNORMAL LOW (ref 3.5–5.1)
Sodium: 158 mmol/L — ABNORMAL HIGH (ref 135–145)

## 2021-06-17 LAB — CBC
HCT: 36.9 % (ref 36.0–46.0)
Hemoglobin: 12.2 g/dL (ref 12.0–15.0)
MCH: 31.9 pg (ref 26.0–34.0)
MCHC: 33.1 g/dL (ref 30.0–36.0)
MCV: 96.6 fL (ref 80.0–100.0)
Platelets: 169 10*3/uL (ref 150–400)
RBC: 3.82 MIL/uL — ABNORMAL LOW (ref 3.87–5.11)
RDW: 14.2 % (ref 11.5–15.5)
WBC: 15.2 10*3/uL — ABNORMAL HIGH (ref 4.0–10.5)
nRBC: 0 % (ref 0.0–0.2)

## 2021-06-17 IMAGING — DX DG ABD PORTABLE 1V
1 series · 1 of 1 positions shown · non-contrast
Comparison: None.

CLINICAL DATA: Feeding tube placement.

EXAM:
PORTABLE ABDOMEN - 1 VIEW

[abdomen supine]
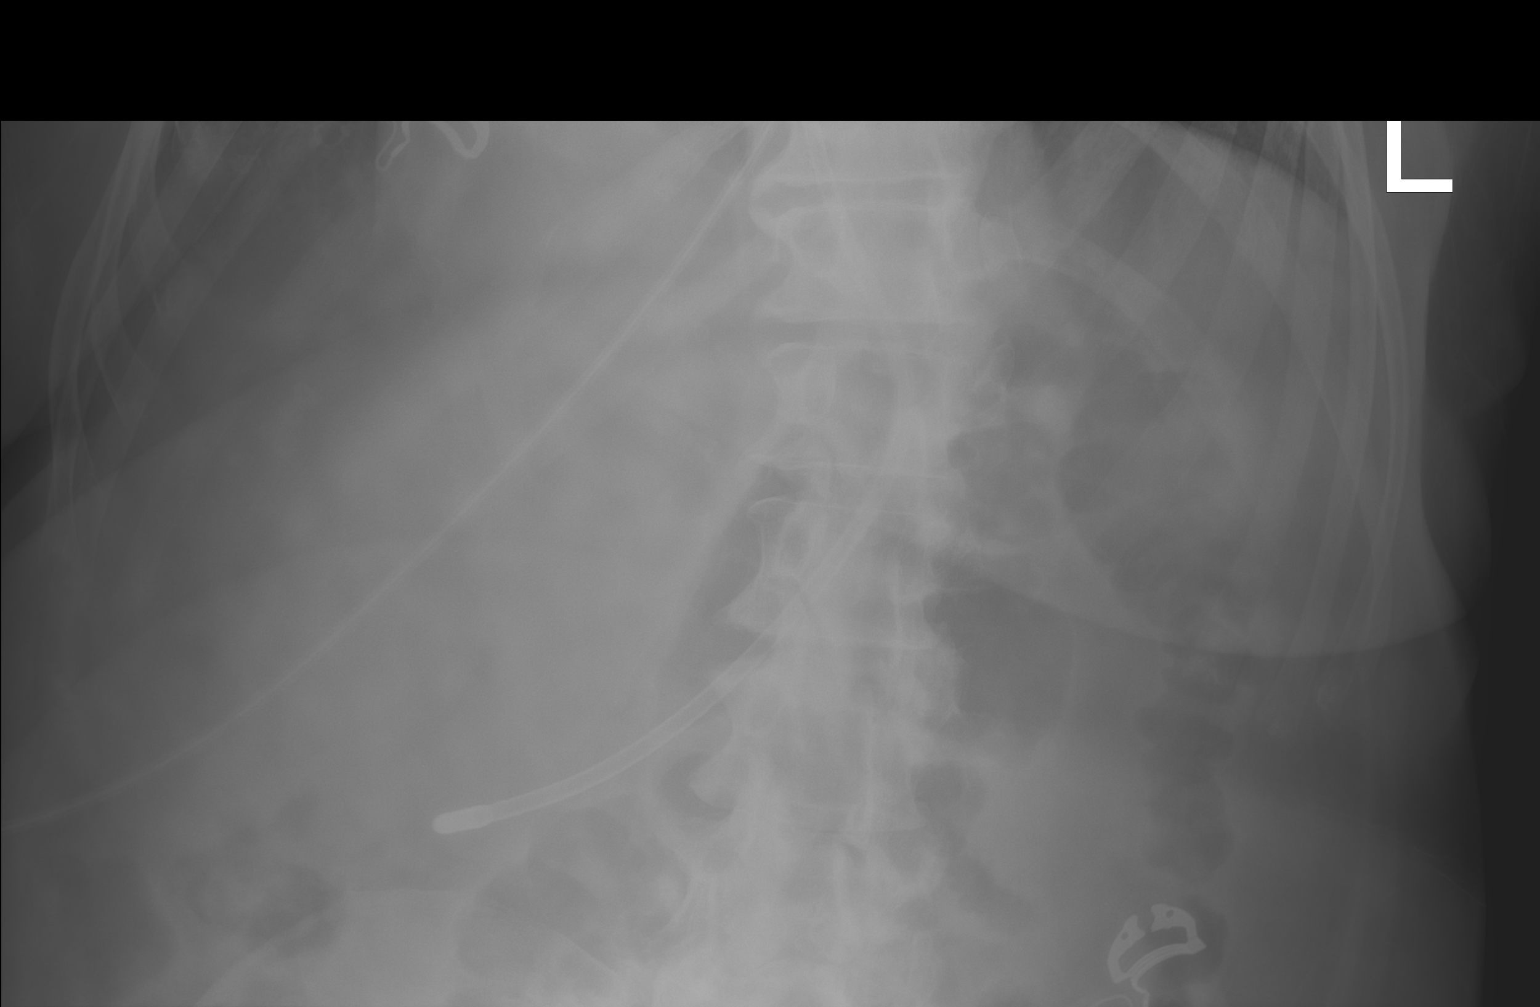

[1 of 1 positions shown; findings below may reference images not displayed]

FINDINGS: Tip of the weighted enteric tube in the right upper quadrant in the
region of the distal stomach. There is air within nondilated bowel
loops in the upper abdomen. No obvious free air.
IMPRESSION: Tip of the weighted enteric tube in the right upper quadrant in the
region of the distal stomach.

## 2021-06-17 MED ORDER — POTASSIUM CHLORIDE 10 MEQ/100ML IV SOLN
10.0000 meq | INTRAVENOUS | Status: AC
Start: 2021-06-17 — End: 2021-06-17
  Administered 2021-06-17 (×4): 10 meq via INTRAVENOUS
  Filled 2021-06-17 (×3): qty 100

## 2021-06-17 MED ORDER — OSMOLITE 1.2 CAL PO LIQD
1000.0000 mL | ORAL | Status: DC
  Administered 2021-06-17 – 2021-06-24 (×9): 1000 mL
  Filled 2021-06-17 (×13): qty 1000

## 2021-06-17 MED ORDER — FREE WATER
115.0000 mL | Status: DC
  Administered 2021-06-17 – 2021-06-24 (×39): 115 mL

## 2021-06-17 MED ORDER — PROSOURCE TF PO LIQD
45.0000 mL | Freq: Two times a day (BID) | ORAL | Status: DC
  Administered 2021-06-17 – 2021-06-26 (×19): 45 mL
  Filled 2021-06-17 (×19): qty 45

## 2021-06-17 NOTE — Progress Notes (Addendum)
Initial Nutrition Assessment  DOCUMENTATION CODES:  Non-severe (moderate) malnutrition in context of chronic illness  INTERVENTION:   No BM documented since PTA. Recommend initiation of bowel regimen.   Monitor magnesium, potassium, and phosphorus daily for at least 3 days, MD to replete as needed, as pt is at risk for refeeding syndrome  Begin TF via Cortrak: -Initiate Osmolite 1.2 cal @ 43ml/hr, advance by 72ml/hr every 8 hours until goal rate of 55ml/hr (1552ml/d) is reached -51ml Prosource TF BID - free water flush Q4H  At goal, TF regimen will provide 1952 kcals, 108 grams protein, free water ( total free water with flushes)  NUTRITION DIAGNOSIS:  Moderate Malnutrition related to chronic illness as evidenced by moderate muscle depletion, moderate fat depletion, mild muscle depletion, mild fat depletion.  GOAL:  Patient will meet greater than or equal to 90% of their needs  MONITOR:  TF tolerance, Labs, I & O's  REASON FOR ASSESSMENT:  Consult Enteral/tube feeding initiation and management  ASSESSMENT:  Pt with a PMH significant for h/o EtOH abuse who presented to the ED with frequent falls, generalized weakness, and confusion, and was admitted with acute delirium and severe sepsis 2/2 cervical epidural abscess (C2-T3/T4); cervical discitis/osteomyelitis; UTI. Pt has since become less interactive and more confused and has started retaining urine. Neurology was consulted with worsening mental status and nuchal rigidity. LP under fluoro requested, empirically treating meningitis/encephalitis.  8/06-s/p unsuccessful LP 8/07-s/p C5-C6 anterior cervical discectomy and instrumented fusion   Discussed pt with RN.   SLP evaluated pt today and, given continued poor mentation and lethargy, recommended pt be kept NPO with temporary alternative means of nutrition. Received consult to place Cortrak and initiate tube feeding. Pt unable to provide any history at time of  RD visit, no family at bedside. Pt was moaning and expressing pain throughout exam and Cortrak placement (gastric tip confirmed via xray).  NFPE more limited given pt expressing significant pain/discomfort. Current NFPE findings allow for diagnosis of moderate malnutrition, though suspect pt may meet criteria for severe malnutrition -- suspect muscle/fat depletions will be more evident once edema improves.   No PO intake documented.   Medications: folvite, mvi with minerals, thiamine, IV abx Labs: Na 158 (H), K+ 2.8 (L), Cr 2.19 (H)  UOP: 1.1L x24 hours I/O: +3L since admit   No BM documented since PTA. Recommend initiation of bowel regimen.   NUTRITION - FOCUSED PHYSICAL EXAM: Flowsheet Row Most Recent Value  Orbital Region Moderate depletion  Upper Arm Region Moderate depletion  Thoracic and Lumbar Region Mild depletion  Buccal Region Mild depletion  Temple Region Mild depletion  Clavicle Bone Region Mild depletion  Clavicle and Acromion Bone Region Mild depletion  Scapular Bone Region No depletion  Dorsal Hand Mild depletion  Patellar Region Moderate depletion  Anterior Thigh Region Moderate depletion  Posterior Calf Region Moderate depletion  Edema (RD Assessment) Moderate  [generalized and to BLE]  Hair Other (Comment)  [thinning]  Eyes Unable to assess  [would not open eyes on command]  Mouth Unable to assess  [would not open mouth on command]  Skin Other (Comment)  [dry, flaky]  Nails Reviewed      Diet Order:   Diet Order             Diet regular Room service appropriate? Yes; Fluid consistency: Thin  Diet effective now                  EDUCATION NEEDS:  Not appropriate  for education at this time  Skin:  Skin Assessment: Skin Integrity Issues: Skin Integrity Issues:: Incisions Incisions: neck  Last BM:  PTA  Height:  Ht Readings from Last 1 Encounters:  06/14/21 5\' 6"  (1.676 m)   Weight:  Wt Readings from Last 1 Encounters:  06/14/21 85.5 kg    BMI:  Body mass index is 30.42 kg/m.  Estimated Nutritional Needs:  Kcal:  1900-2100 Protein:  100-120 grams Fluid:  >1.9L/d  08/14/21, MS, RD, LDN (she/her/hers) RD pager number and weekend/on-call pager number located in Amion.

## 2021-06-17 NOTE — Progress Notes (Signed)
Physical Therapy Treatment Patient Details Name: Kristy [name] MRN: [id number] DOB: Mar 26, 1946 Today's Date: 06/17/2021    History of Present Illness Pt is a 75yo female presenting to [facility] ED on 8/1 after a fall on 7/29. Pt also fell on 8/2 while in the ED. Pt found to be hypokalemic upon admission. 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 Pt is now s/p drainage of abscess and C5-6 ACDF on 06/16/21. PMH: HTN, polycthemia, alcohol use.    PT Comments    Since initial eval pt found to have cervical abscess with drainage and C5-6 ACDF on 06/16/21.  Initial goals and plan of care remain appropriate, does not need to follow neck precautions.  Pt very limited with therapy today.  She was not able to follow any commands or respond to questions.  At rest she is sleeping, but with any movement pt screams in pain.  Attempted log roll with Max A but too painful to continue to sitting.  Performed PROM throughout extremities to prevent contractures but pt would scream with first couple reps and then calm somewhat.  Question if some of pain behavior is more related to delirium rather than pain - particularly with extremity movement.  Progress as able.    Follow Up Recommendations  SNF;Supervision/Assistance - 24 hour     Equipment Recommendations  Other (comment) (further assessment post acute)    Recommendations for Other Services       Precautions / Restrictions Precautions Precautions: Fall;Cervical;Other (comment) (pain) Precaution Booklet Issued: No Precaution Comments: pain    Mobility  Bed Mobility Overal bed mobility: Needs Assistance Bed Mobility: Rolling Rolling: Total assist;+2 for physical assistance         General bed mobility comments: Attempted rolling but pt yelling in pain/confusion.  Unable to try sitting EOB due to pt yelling.    Transfers                    Ambulation/Gait                  Stairs             Wheelchair Mobility    Modified Rankin (Stroke Patients Only)       Balance Overall balance assessment: Needs assistance     Sitting balance - Comments: unable                                    Cognition Arousal/Alertness: Lethargic Behavior During Therapy: WFL for tasks assessed/performed Overall Cognitive Status: Impaired/Different from baseline Area of Impairment: Attention;Following commands;Safety/judgement;Awareness;Memory;Orientation;Problem solving                 Orientation Level: Disoriented to;Person;Place;Time;Situation Current Attention Level: Sustained   Following Commands:  (not following commands)   Awareness: Intellectual Problem Solving: Slow processing;Decreased initiation;Difficulty sequencing;Requires verbal cues;Requires tactile cues General Comments: Pt lethargic but would open eyes when aroused.  Unable to state name or follow any commands. Pt moaning with any little movement but falling back to sleep when stil.      Exercises Other Exercises Other Exercises: PROM 1 x 5 to prevent contractures: ankle pumps, heel slides, composite finger flex/ext, wrist flex/ext, elbow flex/ext, shoulder flex/ext.  Pt yells with first couple reps but then calms.  Pt keeping head turned right.  Gently tried to rotate toward neutral but only minimal movement.  Placed  blanket roll under pillow to move to more neutral position.     General Comments        Pertinent Vitals/Pain Pain Assessment: Faces Faces Pain Scale: Hurts whole lot Pain Location: generalized with any movement; at rest pt falls back asleep Pain Descriptors / Indicators: Grimacing;Moaning Pain Intervention(s): Limited activity within patient's tolerance;Monitored during session;Other (comment) (Asked about pain meds but held due to lethargy/confusion)    Home Living                      Prior Function            PT Goals  (current goals can now be found in the care plan section) Acute Rehab PT Goals Patient Stated Goal: none stated PT Goal Formulation: With patient Time For Goal Achievement: 06/25/21 Potential to Achieve Goals: Poor Progress towards PT goals: Not progressing toward goals - comment (limited by pain)    Frequency    Min 3X/week      PT Plan Frequency needs to be updated    Co-evaluation              AM-PAC PT "6 Clicks" Mobility   Outcome Measure  Help needed turning from your back to your side while in a flat bed without using bedrails?: Total Help needed moving from lying on your back to sitting on the side of a flat bed without using bedrails?: Total Help needed moving to and from a bed to a chair (including a wheelchair)?: Total Help needed standing up from a chair using your arms (e.g., wheelchair or bedside chair)?: Total Help needed to walk in hospital room?: Total Help needed climbing 3-5 steps with a railing? : Total 6 Click Score: 6    End of Session Equipment Utilized During Treatment: Gait belt Activity Tolerance: Patient limited by pain Patient left: in bed;with call bell/phone within reach;with bed alarm set Nurse Communication: Mobility status PT Visit Diagnosis: History of falling (Z91.81);Difficulty in walking, not elsewhere classified (R26.2);Muscle weakness (generalized) (M62.81);Other abnormalities of gait and mobility (R26.89)     Time: 6761-9509 PT Time Calculation (min) (ACUTE ONLY): 16 min  Charges:  $Therapeutic Exercise: 8-22 mins                     Anise Salvo, PT Acute Rehab Services Pager [phone] [facility] [phone]    Rayetta Humphrey 06/17/2021, 4:53 PM

## 2021-06-17 NOTE — Procedures (Signed)
Cortrak  Person Inserting Tube:  Leila Schuff E, RD Tube Type:  Cortrak - 43 inches Tube Size:  10 Tube Location:  Left nare Initial Placement:  Stomach Secured by: Bridle Technique Used to Measure Tube Placement:  Marking at nare/corner of mouth Cortrak Secured At:  65 cm   Cortrak Tube Team Note:  Consult received to place a Cortrak feeding tube.   X-ray is required, abdominal x-ray has been ordered by the Cortrak team. Please confirm tube placement before using the Cortrak tube.   If the tube becomes dislodged please keep the tube and contact the Cortrak team at [email or web address] (password [email or web address]) for replacement.  If after hours and replacement cannot be delayed, place a NG tube and confirm placement with an abdominal x-ray.    Doyle Tegethoff, MS, RD, LDN (she/her/hers) RD pager number and weekend/on-call pager number located in Amion.    

## 2021-06-17 NOTE — Progress Notes (Signed)
  Speech Language Pathology Treatment: Dysphagia  Patient Details Name: Kristy [name] MRN: [id number] DOB: 1946-01-20 Today's Date: 06/17/2021 Time: 4562-5638 SLP Time Calculation (min) (ACUTE ONLY): 8 min  Assessment / Plan / Recommendation Clinical Impression  Pt seen for PO readiness post medication management for pain by RN. Upon repositioning pt upright for PO intake, she began grimacing and moaning in pain as was seen earlier when attempting treatment (RN aware). She was able to obtain a semi-upright position in bed which was adequate for assessment of POs. Ice chips, small spoonfuls of thin liquids and puree trialed, with pt exhibiting decreased oral acceptance, poor awareness of bolus, almost absent bolus manipulation and suspected premature spillage of ice chip/thin liquids. Unsure of cough response vs moaning/groaning due to level of pain. Trials terminated and oral residuals cleared with oral suction. Given continued poor mentation and lethargy, recommend pt continue NPO with temporary alternative means of nutrition. RN reports that MD has already initiated discussion regarding NG vs cortrak placement. SLP to continue f/u for PO readiness.    HPI HPI: Pt is a 75yo female presenting to [facility] ED after a fall on 7/29. Pt also fell on 8/2 while in the ED. Pt found to be hypokalemic with generalized weakness, and confusion. 06/14/21 pt developed acute onset nuchal rigidity, change in mental status, fever and an increase in WBC from 13.9 to 25.3. CT head (06/14/21) showed no acute findings. Chest x-ray (06/14/21) revealed no active disease. PMH: HTN, polycthemia, alcohol use.      SLP Plan  Continue with current plan of care       Recommendations  Diet recommendations: NPO Medication Administration: Via alternative means                Oral Care Recommendations: Oral care QID;Staff/trained caregiver to provide oral care Follow up Recommendations: Skilled Nursing facility;24 hour  supervision/assistance SLP Visit Diagnosis: Dysphagia, unspecified (R13.10) Plan: Continue with current plan of care       GO               Avie Echevaria, MA, CCC-SLP Acute Rehabilitation Services Office Number: [phone]  Paulette Blanch 06/17/2021, 11:49 AM

## 2021-06-17 NOTE — Progress Notes (Signed)
Neurosurgery Service Progress Note  Subjective: No acute events overnight, no new complaints   Objective: Vitals:   06/16/21 1621 06/16/21 2029 06/16/21 2318 06/17/21 0404  BP: (!) 165/91 (!) 148/94 (!) 166/91 (!) 161/92  Pulse:  82 85 82  Resp:  20 20 18   Temp:  97.9 F (36.6 C) (!) 97.5 F (36.4 C) 97.6 F (36.4 C)  TempSrc:  Axillary Axillary Axillary  SpO2:  99% 99% 99%  Weight:      Height:        Physical Exam: laying in bed, moaning in pain to any movement, but will Fcx4 with persistence, incision c/d/I, neck soft  Assessment & Plan: 75 y.o. woman s/p ACDF for large ventral cervical epidural abscess, recovering well.  -activity as tolerated, no brace / collar needed  61  06/17/21 7:53 AM

## 2021-06-17 NOTE — Progress Notes (Signed)
PROGRESS NOTE  Kristy [name]  XAJ:[id number] DOB: 1946-04-28 DOA: 06/10/2021 PCP: Lujean Amel, MD   Brief Narrative: Kristy [name] is a 75 y.o. female with a history of alcohol use who presented to the ED for frequent falls, generalized weakness, and confusion. She was found to be afebrile, hypertensive, and without hypoxia. Also had hypokalemia (K 2.6), SCr 1.88 (above baseline ~1.5), LFT elevation (AST 576, ALT 305). Ammonia normal. EtOH, salicylate, tylenol levels negative. CT head and cervical spine were reassuring, though the patient was unable to rise to seated position from laying prompting consultation for admission. The patient was given potassium supplementation, started on CIWA protocol, and admitted with note made of continued AMS. She has remained weak and confused, though on 8/5 became less interactive, retaining urine, WBC noted to be rising. Blood and urine cultures ordered, ceftriaxone given with bacteriuria and urinary retention. MR brain has not been done yet. Neurology consulted with worsening mental status and nuchal rigidity. LP under fluoro requested, empirically treating meningitis/encephalitis.   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  - Neurology consulted -  bedside LP unable to be performed - cultures to be obtained as above with abscess drainage - MRI remarkable for osteo/epidural abscess as above - Empiric coverage with vancomycin, ampicillin, ceftriaxone 2g, and acyclovir.  - Remains NPO due to mental status/interaction per speech -core track placed today, tube feeds to initiate per dietary, this is planned as a temporary route for medications and nutrition until patient's mental status improves back to baseline  UTI, POA:  - Patient met sepsis criteria at intake given leukocytosis, tachycardia and source (Suspected UTI/meningitis - now confirmed with overt cervical epidural abscess) - Covered by above abx  Ambulatory dysfunction with fall at home, severe weakness:  - Secondary to above - PT/OT recommending SNF rehabilitation once acute infectious process under better control   Alcoholic hepatitis:  - Based on AST predominance of LFT elevations. Hepatitis panel nonreactive.  - No indication for prednisolone at this time. Continue trending. - AST/ALT downtrending - bilirubin increased marginally    Alcohol abuse:  - Per family - 2 beers per day of late, etoh level negative in ED.  - Continue CIWA. Monitor closely for withdrawal. May DC in AM if no need for ativan. - Cessation counseling to be repeated once pt more clear-headed. - Empirically supplementing thiamine, folate, MVM.   Hypokalemia: - Supplemented with resolution   Hyponatremia: Improved with isotonic saline  HTN: - Continue ARB, and increased norvasc.    AKI on stage IIIb CKD:  - Improving - Monitor renal function closely with initiation of vancomycin, acyclovir.  HLD: - Hold statin for now.  DVT prophylaxis: Lovenox 24m Code Status: Full Family Communication: None at bedside Disposition Plan:  Status is: Inpatient  Remains inpatient appropriate because:Persistent severe electrolyte disturbances, Altered mental status, and Unsafe d/c plan  Dispo: The patient  is  from: Home              Anticipated d/c is to: SNF              Patient currently is not medically stable to d/c.   Difficult to place patient No  Consults: Neurology Psychiatry  Procedures:  None  Antimicrobials: Acyclovir, ampicillin, ceftriaxone, vancomycin  Subjective: No acute issues/events overnight -tolerated surgical procedure yesterday quite well, still calling out randomly, non interactive, unable to obtain ROS  Objective: Vitals:   06/16/21 1621 06/16/21 2029 06/16/21 2318 06/17/21 0404  BP: (!) 165/91 (!) 148/94 (!) 166/91 (!) 161/92  Pulse:  82 85 82  Resp:  _0 Temp:  97.9 F (36.6 C) (!) 97.5 F (36.4 C) 97.6 F (36.4 C)  TempSrc:  Axillary Axillary Axillary  SpO2:  99% 99% 99%  Weight:      Height:        Intake/Output Summary (Last 24 hours) at 06/17/2021 0739 Last data filed at 06/17/2021 0421 Gross per 24 hour  Intake 1546.8 ml  Output 1110 ml  Net 436.8 ml    Gen: 75 y.o. female in no distress but yelling out indiscriminantly Pulm: Nonlabored breathing room air. Clear. CV: Regular tachycardia without murmur, or gallop. No JVD or edema. GI: Abdominal exam limited due to pt's withdrawal and yelling with any palpation of any part of her body. She does continue to guard diffusely however.  Ext: Warm, no deformities Skin: Anterior neck postoperative surgical site clean dry intact without erythema or purulence Neuro: Asleep but awakes with verbal cues, somewhat noncoherent speech unable to follow commands Psych: Unable to assess  Data Reviewed: I have personally reviewed following labs and imaging studies  CBC: Recent Labs  Lab 06/10/21 2105 06/11/21 0611 06/14/21 1315 06/16/21 0439 06/17/21 0417  WBC 10.8* 13.9* 25.3* 21.0* 15.2*  NEUTROABS 8.4*  --   --   --   --   HGB 15.2* 15.8* 13.7 11.8* 12.2  HCT 45.3 46.1* 40.7 35.6* 36.9  MCV 95.4 94.3 96.0 95.7 96.6  PLT 100* 106* 205 176 992    Basic Metabolic Panel: Recent Labs  Lab  06/11/21 0000 06/11/21 0611 06/12/21 0333 06/12/21 1558 06/13/21 0306 06/16/21 0439 06/17/21 0417  NA  --    < > 136 137 136 154* 158*  K  --    < > 2.9* 3.7 4.3 3.2* 2.8*  CL  --    < > 98 102 100 120* 124*  CO2  --    < > _1 19* 21*  GLUCOSE  --    < > 116* 110* 84 86 84  BUN  --    < > 31* 31* 40* 80* 79*  CREATININE  --    < > 1.46* 1.51* 1.62* 2.44* 2.19*  CALCIUM  --    < > 9.2 9.4 9.8 9.1 9.0  MG 1.9  --   --   --  2.1  --   --    < > = values in this interval not displayed.    GFR: Estimated Creatinine Clearance: 24.5 mL/min (A) (by C-G formula based on SCr of 2.19 mg/dL (H)). Liver Function Tests: Recent Labs  Lab 06/10/21 2105 06/11/21 0611 06/12/21 0333 06/14/21 2013  AST 576* 425* 138* 120*  ALT 305* 315* 199* 136*  ALKPHOS 101 117 92 104  BILITOT 3.0* 3.0* 3.3* 6.5*  PROT 6.8 6.9 6.4* 6.9  ALBUMIN 2.1* 2.0* 1.8*  1.6*    No results for input(s): LIPASE, AMYLASE in the last 168 hours. Recent Labs  Lab 06/11/21 0000  AMMONIA 25    Coagulation Profile: Recent Labs  Lab 06/10/21 2105  INR 1.2    Cardiac Enzymes: Recent Labs  Lab 06/10/21 2105  CKTOTAL 138    BNP (last 3 results) No results for input(s): PROBNP in the last 8760 hours. HbA1C: No results for input(s): HGBA1C in the last 72 hours. CBG: Recent Labs  Lab 06/16/21 0050 06/16/21 0606 06/16/21 1046 06/16/21 1432 06/16/21 1732  GLUCAP 92 76 78 82 89    Lipid Profile: No results for input(s): CHOL, HDL, LDLCALC, TRIG, CHOLHDL, LDLDIRECT in the last 72 hours. Thyroid Function Tests: No results for input(s): TSH, T4TOTAL, FREET4, T3FREE, THYROIDAB in the last 72 hours.  Anemia Panel: Recent Labs    06/14/21 2013  FOLATE 16.0    Urine analysis:    Component Value Date/Time   COLORURINE AMBER (A) 06/14/2021 1338   APPEARANCEUR HAZY (A) 06/14/2021 1338   LABSPEC 1.011 06/14/2021 1338   PHURINE 6.0 06/14/2021 1338   GLUCOSEU NEGATIVE 06/14/2021 1338   HGBUR  LARGE (A) 06/14/2021 1338   BILIRUBINUR NEGATIVE 06/14/2021 1338   KETONESUR NEGATIVE 06/14/2021 1338   PROTEINUR NEGATIVE 06/14/2021 1338   UROBILINOGEN 0.2 05/14/2008 0020   NITRITE NEGATIVE 06/14/2021 1338   LEUKOCYTESUR NEGATIVE 06/14/2021 1338   Recent Results (from the past 240 hour(s))  Resp Panel by RT-PCR (Flu A&B, Covid) Nasopharyngeal Swab     Status: None   Collection Time: 06/11/21  3:13 AM   Specimen: Nasopharyngeal Swab; Nasopharyngeal(NP) swabs in vial transport medium  Result Value Ref Range Status   SARS Coronavirus 2 by RT PCR NEGATIVE NEGATIVE Final    Comment: (NOTE) SARS-CoV-2 target nucleic acids are NOT DETECTED.  The SARS-CoV-2 RNA is generally detectable in upper respiratory specimens during the acute phase of infection. The lowest concentration of SARS-CoV-2 viral copies this assay can detect is 138 copies/mL. A negative result does not preclude SARS-Cov-2 infection and should not be used as the sole basis for treatment or other patient management decisions. A negative result may occur with  improper specimen collection/handling, submission of specimen other than nasopharyngeal swab, presence of viral mutation(s) within the areas targeted by this assay, and inadequate number of viral copies(<138 copies/mL). A negative result must be combined with clinical observations, patient history, and epidemiological information. The expected result is Negative.  Fact Sheet for Patients:  EntrepreneurPulse.com.au  Fact Sheet for Healthcare Providers:  IncredibleEmployment.be  This test is no t yet approved or cleared by the [place] FDA and  has been authorized for detection and/or diagnosis of SARS-CoV-2 by FDA under an Emergency Use Authorization (EUA). This EUA will remain  in effect (meaning this test can be used) for the duration of the COVID-19 declaration under Section 564(b)(1) of the Act, 21 U.S.C.section  360bbb-3(b)(1), unless the authorization is terminated  or revoked sooner.       Influenza A by PCR NEGATIVE NEGATIVE Final   Influenza B by PCR NEGATIVE NEGATIVE Final    Comment: (NOTE) The Xpert Xpress SARS-CoV-2/FLU/RSV plus assay is intended as an aid in the diagnosis of influenza from Nasopharyngeal swab specimens and should not be used as a sole basis for treatment. Nasal washings and aspirates are unacceptable for Xpert Xpress SARS-CoV-2/FLU/RSV testing.  Fact Sheet for Patients: EntrepreneurPulse.com.au  Fact Sheet for Healthcare Providers: IncredibleEmployment.be  This test is not yet approved or cleared  by the [place] and has been authorized for detection and/or diagnosis of SARS-CoV-2 by FDA under an Emergency Use Authorization (EUA). This EUA will remain in effect (meaning this test can be used) for the duration of the COVID-19 declaration under Section 564(b)(1) of the Act, 21 U.S.C. section 360bbb-3(b)(1), unless the authorization is terminated or revoked.  Performed at [facility], [place] [address], [place], [place] [address]   Urine Culture     Status: Abnormal   Collection Time: 06/14/21  1:28 PM   Specimen: Urine, Clean Catch  Result Value Ref Range Status   Specimen Description URINE, CLEAN CATCH  Final   Special Requests   Final    NONE Performed at [facility], [place] [address], [place], [place] [address]    Culture >=100,000 COLONIES/mL STAPHYLOCOCCUS AUREUS (A)  Final   Report Status 06/16/2021 FINAL  Final   Organism ID, Bacteria STAPHYLOCOCCUS AUREUS (A)  Final      Susceptibility   Staphylococcus aureus - MIC*    CIPROFLOXACIN <=0.5 SENSITIVE Sensitive     GENTAMICIN <=0.5 SENSITIVE Sensitive     NITROFURANTOIN <=16 SENSITIVE Sensitive     OXACILLIN 0.5 SENSITIVE Sensitive     TETRACYCLINE <=1 SENSITIVE Sensitive     VANCOMYCIN <=0.5 SENSITIVE Sensitive     TRIMETH/SULFA <=10 SENSITIVE  Sensitive     CLINDAMYCIN <=0.25 SENSITIVE Sensitive     RIFAMPIN <=0.5 SENSITIVE Sensitive     Inducible Clindamycin NEGATIVE Sensitive     * >=100,000 COLONIES/mL STAPHYLOCOCCUS AUREUS  Culture, blood (routine x 2)     Status: None (Preliminary result)   Collection Time: 06/14/21  7:40 PM   Specimen: BLOOD  Result Value Ref Range Status   Specimen Description BLOOD RIGHT ANTECUBITAL  Final   Special Requests AEROBIC BOTTLE ONLY Blood Culture adequate volume  Final   Culture   Final    NO GROWTH 2 DAYS Performed at [facility], [place] [address], [place], [place] [address]    Report Status PENDING  Incomplete  Culture, blood (routine x 2)     Status: None (Preliminary result)   Collection Time: 06/14/21  8:13 PM   Specimen: BLOOD RIGHT HAND  Result Value Ref Range Status   Specimen Description BLOOD RIGHT HAND  Final   Special Requests   Final    AEROBIC BOTTLE ONLY Blood Culture results may not be optimal due to an inadequate volume of blood received in culture bottles   Culture   Final    NO GROWTH 2 DAYS Performed at [facility], [place] [address], [place], [place] [address]    Report Status PENDING  Incomplete  Surgical pcr screen     Status: Abnormal   Collection Time: 06/16/21 11:08 AM   Specimen: Nasal Mucosa; Nasal Swab  Result Value Ref Range Status   MRSA, PCR NEGATIVE NEGATIVE Final   Staphylococcus aureus POSITIVE (A) NEGATIVE Final    Comment: (NOTE) The Xpert SA Assay (FDA approved for NASAL specimens in patients 96 years of age and older), is one component of a comprehensive surveillance program. It is not intended to diagnose infection nor to guide or monitor treatment. Performed at [facility], [place] [address], [place], [place] [address]   Aerobic/Anaerobic Culture w Gram Stain (surgical/deep wound)     Status: None (Preliminary result)   Collection Time: 06/16/21 12:42 PM   Specimen: Abscess  Result Value Ref Range Status   Specimen  Description ABSCESS  Final   Special Requests EPIDURAL ABSCESS  Final   Gram Stain   Final    FEW WBC PRESENT,BOTH PMN AND MONONUCLEAR FEW GRAM POSITIVE COCCI Performed at [facility], [place] [address] [place], [place] [address]    Culture PENDING  Incomplete   Report Status PENDING  Incomplete      Radiology Studies: DG Cervical Spine 1 View  Result Date: 06/16/2021 CLINICAL DATA:  Neck pain EXAM: DG CERVICAL SPINE - 1 VIEW; DG C-ARM 1-60 MIN COMPARISON:  MRI 06/16/2021 FINDINGS: Single view intraoperative radiograph of the cervical spine demonstrates cervical spine from the occiput foot through C5-6. C5-6 anterior cervical discectomy and fusion with instrumentation has been performed. Normal alignment. Ray-Tec is seen within the soft tissues anterior to the fusion. Endotracheal tube is in place. Fluoroscopy time: 3 seconds Dose: 0.25 mGy Images: 1 IMPRESSION: ACDF C5-6.  Ray-Tec in place within the anterior soft tissues. Electronically Signed   By: Fidela Salisbury MD   On: 06/16/2021 15:04   MR BRAIN WO CONTRAST  Result Date: 06/15/2021 CLINICAL DATA:  Mental status change, unknown cause. Concern for meningitis. EXAM: MRI HEAD WITHOUT CONTRAST TECHNIQUE: Multiplanar, multiecho pulse sequences of the brain and surrounding structures were obtained without intravenous contrast. COMPARISON:  Head CT 06/14/2021 FINDINGS: Brain: There is no evidence of an acute infarct, intracranial hemorrhage, mass, midline shift, or extra-axial fluid collection. There is mild-to-moderate cerebral atrophy. There is confluent T2 hyperintensity along the margins of the occipital and temporal horns of the lateral ventricles suspicious for mild edema. Patchy T2 hyperintensity in the periatrial regions more resembles chronic small vessel ischemia. No definite debris is evident in the ventricles. No sulcal FLAIR signal abnormality is identified. Vascular: Major intracranial vascular flow voids are preserved. Skull and  upper cervical spine: No suspicious calvarial lesion. Abnormal intermediate T1 signal material in the midline and left lateral ventral spinal canal at C2-3, possibly epidural. Sinuses/Orbits: Paranasal sinuses and mastoid air cells are clear. Unremarkable orbits. Other: None. IMPRESSION: 1. Confluent periventricular T2 hyperintensity, indeterminate. Given the clinical concern for infection, this could reflect mild edema from ventriculitis although no ventricular debris is evident and there are no other findings of meningitis on this unenhanced study. 2. Mild chronic small vessel ischemic disease elsewhere in the cerebral white matter. 3. No acute infarct. 4. Abnormal material in the included upper cervical spinal canal of uncertain etiology. Cervical spine MRI is recommended for further evaluation. These results will be called to the ordering clinician or representative by the Radiologist Assistant, and communication documented in the PACS or [employer]. Electronically Signed   By: Logan Bores M.D.   On: 06/15/2021 19:16   MR CERVICAL SPINE WO CONTRAST  Result Date: 06/16/2021 CLINICAL DATA:  Cervical radiculopathy. Infection suspected. Elevated CRP and leukocytosis. EXAM: MRI CERVICAL, THORACIC AND LUMBAR SPINE WITHOUT CONTRAST TECHNIQUE: Multiplanar and multiecho pulse sequences of the cervical spine, to include the craniocervical junction and cervicothoracic junction, and thoracic and lumbar spine, were obtained without intravenous contrast. COMPARISON:  Cervical spine CT from admission FINDINGS: MRI CERVICAL SPINE FINDINGS Alignment: Exaggerated cervical lordosis with mild C7-T1 anterolisthesis. Vertebrae: Subtle marrow edema at the C5-C7 vertebral levels. There is disc T2 hyperintensity at C5-6 and C6-7 with extensive prevertebral edema and posterior-lateral paravertebral edema. Cord: Multilevel deformity from epidural collection extending from approximately C2-T4, more inferiorly extensive  posteriorly. No detected cord edema Posterior Fossa, vertebral arteries, paraspinal tissues: As above Disc levels: Given patient motion, evaluation of  degenerative disease is limited. There is diffuse degenerative facet spurring. Milder disc degeneration with generalized narrowing and bulging. MRI THORACIC SPINE FINDINGS Alignment:  Exaggerated thoracic kyphosis. Vertebrae: T1-2 disc and adjacent endplate hyperintensity. Cord: Normal signal and morphology. Epidural collection continues from the cervical levels into the upper thoracic spine, posteriorly terminating at approximately T3-4. Paraspinal and other soft tissues: Paravertebral edema at the upper thoracic levels related to cervical spine disease. Disc levels: Generalize bridging osteophytes and ankylosis. Upper thoracic degenerative facet spurring. No canal or foraminal impingement MRI LUMBAR SPINE FINDINGS Segmentation:  5 lumbar type vertebrae Alignment:  Mild degenerative anterolisthesis at L4-5 Vertebrae: No visible fracture, discitis, or aggressive bone lesion. Conus medullaris and cauda equina: Conus extends to the T12-L1 level. Conus and cauda equina appear normal. Paraspinal and other soft tissues: Cystic appearing spaces in the presacral space on the right intimately associated with piriformis. The sacroiliac joints are largely on covered. No visible adjacent marrow edema Disc levels: T12- L1: Disc narrowing and bulging.  Mild facet spurring L1-L2: Disc narrowing and bulging.  Mild facet spurring L2-L3: Disc narrowing and bulging with degenerative facet spurring. Left subarticular recess stenosis that could affect the L3 nerve root L3-L4: Disc narrowing and bulging with degenerative facet spurring L4-L5: Facet osteoarthritis with spurring and anterolisthesis. The disc is narrowed and bulging. Moderate spinal stenosis. L5-S1:Facet osteoarthritis with bulky spurring. Mild disc space narrowing. Case discussed with Dr. Leonel Ramsay. IMPRESSION: 1.  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 2. Suspected inflammation/collection partially covered in the right pelvis along the piriformis, possible septic arthritis of the right sacroiliac joint which is only minimally covered. Recommend dedicated pelvis MRI. Electronically Signed   By: Monte Fantasia M.D.   On: 06/16/2021 04:44   MR THORACIC SPINE WO CONTRAST  Result Date: 06/16/2021 CLINICAL DATA:  Cervical radiculopathy. Infection suspected. Elevated CRP and leukocytosis. EXAM: MRI CERVICAL, THORACIC AND LUMBAR SPINE WITHOUT CONTRAST TECHNIQUE: Multiplanar and multiecho pulse sequences of the cervical spine, to include the craniocervical junction and cervicothoracic junction, and thoracic and lumbar spine, were obtained without intravenous contrast. COMPARISON:  Cervical spine CT from admission FINDINGS: MRI CERVICAL SPINE FINDINGS Alignment: Exaggerated cervical lordosis with mild C7-T1 anterolisthesis. Vertebrae: Subtle marrow edema at the C5-C7 vertebral levels. There is disc T2 hyperintensity at C5-6 and C6-7 with extensive prevertebral edema and posterior-lateral paravertebral edema. Cord: Multilevel deformity from epidural collection extending from approximately C2-T4, more inferiorly extensive posteriorly. No detected cord edema Posterior Fossa, vertebral arteries, paraspinal tissues: As above Disc levels: Given patient motion, evaluation of degenerative disease is limited. There is diffuse degenerative facet spurring. Milder disc degeneration with generalized narrowing and bulging. MRI THORACIC SPINE FINDINGS Alignment:  Exaggerated thoracic kyphosis. Vertebrae: T1-2 disc and adjacent endplate hyperintensity. Cord: Normal signal and morphology. Epidural collection continues from the cervical levels into the upper thoracic spine, posteriorly terminating at approximately T3-4. Paraspinal and  other soft tissues: Paravertebral edema at the upper thoracic levels related to cervical spine disease. Disc levels: Generalize bridging osteophytes and ankylosis. Upper thoracic degenerative facet spurring. No canal or foraminal impingement MRI LUMBAR SPINE FINDINGS Segmentation:  5 lumbar type vertebrae Alignment:  Mild degenerative anterolisthesis at L4-5 Vertebrae: No visible fracture, discitis, or aggressive bone lesion. Conus medullaris and cauda equina: Conus extends to the T12-L1 level. Conus and cauda equina appear normal. Paraspinal and other soft tissues: Cystic appearing spaces in the presacral space on the right intimately  associated with piriformis. The sacroiliac joints are largely on covered. No visible adjacent marrow edema Disc levels: T12- L1: Disc narrowing and bulging.  Mild facet spurring L1-L2: Disc narrowing and bulging.  Mild facet spurring L2-L3: Disc narrowing and bulging with degenerative facet spurring. Left subarticular recess stenosis that could affect the L3 nerve root L3-L4: Disc narrowing and bulging with degenerative facet spurring L4-L5: Facet osteoarthritis with spurring and anterolisthesis. The disc is narrowed and bulging. Moderate spinal stenosis. L5-S1:Facet osteoarthritis with bulky spurring. Mild disc space narrowing. Case discussed with Dr. Leonel Ramsay. IMPRESSION: 1. 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 2. Suspected inflammation/collection partially covered in the right pelvis along the piriformis, possible septic arthritis of the right sacroiliac joint which is only minimally covered. Recommend dedicated pelvis MRI. Electronically Signed   By: Monte Fantasia M.D.   On: 06/16/2021 04:44   MR LUMBAR SPINE WO CONTRAST  Result Date: 06/16/2021 CLINICAL DATA:  Cervical radiculopathy. Infection suspected. Elevated CRP and leukocytosis. EXAM: MRI CERVICAL,  THORACIC AND LUMBAR SPINE WITHOUT CONTRAST TECHNIQUE: Multiplanar and multiecho pulse sequences of the cervical spine, to include the craniocervical junction and cervicothoracic junction, and thoracic and lumbar spine, were obtained without intravenous contrast. COMPARISON:  Cervical spine CT from admission FINDINGS: MRI CERVICAL SPINE FINDINGS Alignment: Exaggerated cervical lordosis with mild C7-T1 anterolisthesis. Vertebrae: Subtle marrow edema at the C5-C7 vertebral levels. There is disc T2 hyperintensity at C5-6 and C6-7 with extensive prevertebral edema and posterior-lateral paravertebral edema. Cord: Multilevel deformity from epidural collection extending from approximately C2-T4, more inferiorly extensive posteriorly. No detected cord edema Posterior Fossa, vertebral arteries, paraspinal tissues: As above Disc levels: Given patient motion, evaluation of degenerative disease is limited. There is diffuse degenerative facet spurring. Milder disc degeneration with generalized narrowing and bulging. MRI THORACIC SPINE FINDINGS Alignment:  Exaggerated thoracic kyphosis. Vertebrae: T1-2 disc and adjacent endplate hyperintensity. Cord: Normal signal and morphology. Epidural collection continues from the cervical levels into the upper thoracic spine, posteriorly terminating at approximately T3-4. Paraspinal and other soft tissues: Paravertebral edema at the upper thoracic levels related to cervical spine disease. Disc levels: Generalize bridging osteophytes and ankylosis. Upper thoracic degenerative facet spurring. No canal or foraminal impingement MRI LUMBAR SPINE FINDINGS Segmentation:  5 lumbar type vertebrae Alignment:  Mild degenerative anterolisthesis at L4-5 Vertebrae: No visible fracture, discitis, or aggressive bone lesion. Conus medullaris and cauda equina: Conus extends to the T12-L1 level. Conus and cauda equina appear normal. Paraspinal and other soft tissues: Cystic appearing spaces in the presacral  space on the right intimately associated with piriformis. The sacroiliac joints are largely on covered. No visible adjacent marrow edema Disc levels: T12- L1: Disc narrowing and bulging.  Mild facet spurring L1-L2: Disc narrowing and bulging.  Mild facet spurring L2-L3: Disc narrowing and bulging with degenerative facet spurring. Left subarticular recess stenosis that could affect the L3 nerve root L3-L4: Disc narrowing and bulging with degenerative facet spurring L4-L5: Facet osteoarthritis with spurring and anterolisthesis. The disc is narrowed and bulging. Moderate spinal stenosis. L5-S1:Facet osteoarthritis with bulky spurring. Mild disc space narrowing. Case discussed with Dr. Leonel Ramsay. IMPRESSION: 1. 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 2. Suspected inflammation/collection partially covered in the right pelvis along the piriformis, possible septic arthritis of the right sacroiliac joint which is only minimally covered. Recommend dedicated pelvis MRI. Electronically  Signed   By: Monte Fantasia M.D.   On: 06/16/2021 04:44   DG C-Arm 1-60 Min  Result Date: 06/16/2021 CLINICAL DATA:  Neck pain EXAM: DG CERVICAL SPINE - 1 VIEW; DG C-ARM 1-60 MIN COMPARISON:  MRI 06/16/2021 FINDINGS: Single view intraoperative radiograph of the cervical spine demonstrates cervical spine from the occiput foot through C5-6. C5-6 anterior cervical discectomy and fusion with instrumentation has been performed. Normal alignment. Ray-Tec is seen within the soft tissues anterior to the fusion. Endotracheal tube is in place. Fluoroscopy time: 3 seconds Dose: 0.25 mGy Images: 1 IMPRESSION: ACDF C5-6.  Ray-Tec in place within the anterior soft tissues. Electronically Signed   By: Fidela Salisbury MD   On: 06/16/2021 15:04    Scheduled Meds:  allopurinol  300 mg Oral Daily   amLODipine  10 mg Oral Daily   aspirin  81 mg  Oral Daily   Chlorhexidine Gluconate Cloth  6 each Topical Daily   folic acid  1 mg Oral Daily   irbesartan  150 mg Oral Daily   multivitamin with minerals  1 tablet Oral Daily   [START ON 06/22/2021] thiamine injection  100 mg Intravenous Daily   Continuous Infusions:  sodium chloride Stopped (06/16/21 0757)   ampicillin (OMNIPEN) IV 2 g (06/17/21 0421)   cefTRIAXone (ROCEPHIN)  IV 2 g (06/16/21 2321)   thiamine injection     vancomycin Stopped (06/16/21 2000)     LOS: 6 days   Time spent: 35 minutes.  Little Ishikawa, DO [facility] Pager: Secure chat  06/17/2021, 7:39 AM

## 2021-06-17 NOTE — TOC Progression Note (Signed)
Transition of Care [facility]) - Progression Note    Patient Details  Name: Kristy [name] MRN: [id number] Date of Birth: 09-08-1946  [facility]) CM/SW Contact  [name], [place] Phone Number: 06/17/2021, 4:57 PM  Clinical Narrative:    CSW was notified by Revonda Standard at [facility] that pt had been accepted to the [place] location, pending removal of her cortrak. CSW attempted to update dtr, unable to leave VM. TOC will continue to follow.   Expected Discharge Plan: Skilled Nursing Facility Barriers to Discharge: Continued Medical Work up, SNF Pending bed offer  Expected Discharge Plan and Services Expected Discharge Plan: Skilled Nursing Facility     Post Acute Care Choice: Skilled Nursing Facility Living arrangements for the past 2 months: Single Family Home                                       Social Determinants of Health (SDOH) Interventions    Readmission Risk Interventions No flowsheet data found.

## 2021-06-17 NOTE — Progress Notes (Signed)
SLP Cancellation Note  Patient Details Name: Kristy [name] MRN: [id number] DOB: 03-14-1946   Cancelled treatment:       Reason Eval/Treat Not Completed: Pain limiting ability to participate. Pt grimacing and moaning in pain with RN and clinician attempt to position upright in bed for safe PO intake. Pt unable due to pain. RN addressing and monitoring. Will f/u as able for PO readiness.     Avie Echevaria, MA, CCC-SLP Acute Rehabilitation Services Office Number: [phone]  Paulette Blanch 06/17/2021, 9:29 AM

## 2021-06-17 NOTE — Progress Notes (Signed)
ID Brief Note   DC ampicillin, continue Vancomycin and ceftriaxone Following  Odette Fraction, MD Infectious Disease Physician [facility] [address] [place], [place] [address] Phone: [phone]  Fax: [phone]

## 2021-06-18 ENCOUNTER — Inpatient Hospital Stay (HOSPITAL_COMMUNITY): Payer: Medicare Other

## 2021-06-18 DIAGNOSIS — E440 Moderate protein-calorie malnutrition: Secondary | ICD-10-CM | POA: Insufficient documentation

## 2021-06-18 DIAGNOSIS — I340 Nonrheumatic mitral (valve) insufficiency: Secondary | ICD-10-CM

## 2021-06-18 DIAGNOSIS — I361 Nonrheumatic tricuspid (valve) insufficiency: Secondary | ICD-10-CM

## 2021-06-18 DIAGNOSIS — I351 Nonrheumatic aortic (valve) insufficiency: Secondary | ICD-10-CM

## 2021-06-18 LAB — CBC
HCT: 37 % (ref 36.0–46.0)
Hemoglobin: 12.3 g/dL (ref 12.0–15.0)
MCH: 31.9 pg (ref 26.0–34.0)
MCHC: 33.2 g/dL (ref 30.0–36.0)
MCV: 95.9 fL (ref 80.0–100.0)
Platelets: 140 10*3/uL — ABNORMAL LOW (ref 150–400)
RBC: 3.86 MIL/uL — ABNORMAL LOW (ref 3.87–5.11)
RDW: 14.8 % (ref 11.5–15.5)
WBC: 13.3 10*3/uL — ABNORMAL HIGH (ref 4.0–10.5)
nRBC: 0 % (ref 0.0–0.2)

## 2021-06-18 LAB — BASIC METABOLIC PANEL
Anion gap: 17 — ABNORMAL HIGH (ref 5–15)
BUN: 102 mg/dL — ABNORMAL HIGH (ref 8–23)
CO2: 17 mmol/L — ABNORMAL LOW (ref 22–32)
Calcium: 8.7 mg/dL — ABNORMAL LOW (ref 8.9–10.3)
Chloride: 123 mmol/L — ABNORMAL HIGH (ref 98–111)
Creatinine, Ser: 2.74 mg/dL — ABNORMAL HIGH (ref 0.44–1.00)
GFR, Estimated: 18 mL/min — ABNORMAL LOW (ref >60)
Glucose, Bld: 119 mg/dL — ABNORMAL HIGH (ref 70–99)
Potassium: 3.9 mmol/L (ref 3.5–5.1)
Sodium: 157 mmol/L — ABNORMAL HIGH (ref 135–145)

## 2021-06-18 LAB — ECHOCARDIOGRAM COMPLETE
Area-P 1/2: 2.67 cm2
Calc EF: 60.8 %
Height: 66 in
MV M vel: 4.54 m/s
MV Peak grad: 82.4 mmHg
S' Lateral: 3.5 cm
Single Plane A2C EF: 59.6 %
Single Plane A4C EF: 60.3 %
Weight: 3008 oz

## 2021-06-18 LAB — GLUCOSE, CAPILLARY
Glucose-Capillary: 103 mg/dL — ABNORMAL HIGH (ref 70–99)
Glucose-Capillary: 75 mg/dL (ref 70–99)
Glucose-Capillary: 113 mg/dL — ABNORMAL HIGH (ref 70–99)

## 2021-06-18 MED ORDER — ADULT MULTIVITAMIN W/MINERALS CH
1.0000 | ORAL_TABLET | Freq: Every day | ORAL | Status: DC
  Administered 2021-06-18 – 2021-06-28 (×11): 1
  Filled 2021-06-18 (×11): qty 1

## 2021-06-18 MED ORDER — LORAZEPAM 1 MG PO TABS
1.0000 mg | ORAL_TABLET | ORAL | Status: DC | PRN
  Filled 2021-06-18: qty 1

## 2021-06-18 MED ORDER — FOLIC ACID 1 MG PO TABS
1.0000 mg | ORAL_TABLET | Freq: Every day | ORAL | Status: DC
  Administered 2021-06-18 – 2021-07-11 (×24): 1 mg
  Filled 2021-06-18 (×24): qty 1

## 2021-06-18 MED ORDER — POLYETHYLENE GLYCOL 3350 17 G PO PACK: 17.0000 g | PACK | Freq: Every day | ORAL | Status: DC | PRN

## 2021-06-18 MED ORDER — SODIUM CHLORIDE 0.9 % IV SOLN
12.0000 g | INTRAVENOUS | Status: DC
  Administered 2021-06-18: 12 g via INTRAVENOUS
  Filled 2021-06-18 (×3): qty 12000

## 2021-06-18 MED ORDER — HYDROCODONE-ACETAMINOPHEN 5-325 MG PO TABS: 1.0000 | ORAL_TABLET | ORAL | Status: DC | PRN

## 2021-06-18 MED ORDER — ALLOPURINOL 300 MG PO TABS
300.0000 mg | ORAL_TABLET | Freq: Every day | ORAL | Status: DC
  Administered 2021-06-18 – 2021-07-11 (×24): 300 mg
  Filled 2021-06-18 (×24): qty 1

## 2021-06-18 MED ORDER — ASPIRIN 81 MG PO CHEW
81.0000 mg | CHEWABLE_TABLET | Freq: Every day | ORAL | Status: DC
  Administered 2021-06-18 – 2021-06-26 (×9): 81 mg
  Filled 2021-06-18 (×9): qty 1

## 2021-06-18 MED ORDER — ACETAMINOPHEN 160 MG/5ML PO SOLN
650.0000 mg | Freq: Four times a day (QID) | ORAL | Status: DC | PRN
  Administered 2021-06-29 – 2021-06-30 (×2): 650 mg
  Filled 2021-06-18 (×2): qty 20.3

## 2021-06-18 MED ORDER — LORAZEPAM 2 MG/ML IJ SOLN
1.0000 mg | INTRAMUSCULAR | Status: DC | PRN
  Administered 2021-06-20: 1 mg via INTRAVENOUS
  Filled 2021-06-18 (×3): qty 1

## 2021-06-18 MED ORDER — AMLODIPINE BESYLATE 10 MG PO TABS
10.0000 mg | ORAL_TABLET | Freq: Every day | ORAL | Status: DC
  Administered 2021-06-18 – 2021-07-03 (×16): 10 mg
  Filled 2021-06-18 (×16): qty 1

## 2021-06-18 MED ORDER — MELATONIN 3 MG PO TABS
3.0000 mg | ORAL_TABLET | Freq: Every evening | ORAL | Status: DC | PRN
  Administered 2021-06-21 – 2021-07-09 (×7): 3 mg
  Filled 2021-06-18 (×8): qty 1

## 2021-06-18 MED ORDER — MUPIROCIN 2 % EX OINT
TOPICAL_OINTMENT | Freq: Two times a day (BID) | CUTANEOUS | Status: AC
  Administered 2021-06-21: 1 via NASAL
  Filled 2021-06-18 (×2): qty 22

## 2021-06-18 NOTE — Progress Notes (Signed)
  Echocardiogram 2D Echocardiogram has been performed.  Kristy [name] 06/18/2021, 10:33 AM

## 2021-06-18 NOTE — Progress Notes (Addendum)
ID Brief Note   TTE ordered for concern of systemic infection with staph aureus with epidural abscess and urine culture positive for MSSA Will change Vancomycin and ceftriaxone to IV nafcillin for 8/7 epidural abscess cultures growing MSSA Following OK to place a PICC line. Blood cultures 8/5 are no growth in 4 days  Following  Odette Fraction, MD Infectious Disease Physician [facility] [address] [place], [place] [address] Phone: [phone]  Fax: [phone]

## 2021-06-18 NOTE — Progress Notes (Signed)
PROGRESS NOTE  Kristy [name]  ZLD:[id number] DOB: 1946-05-29 DOA: 06/10/2021 PCP: Darrow Bussing, MD   Brief Narrative: Kristy [name] is a 75 y.o. female with a history of alcohol use who presented to the ED for frequent falls, generalized weakness, and confusion. She was found to be afebrile, hypertensive, and without hypoxia. Also had hypokalemia (K 2.6), SCr 1.88 (above baseline ~1.5), LFT elevation (AST 576, ALT 305). Ammonia normal. EtOH, salicylate, tylenol levels negative. CT head and cervical spine were reassuring, though the patient was unable to rise to seated position from laying prompting consultation for admission. The patient was given potassium supplementation, started on CIWA protocol, and admitted with note made of continued AMS. She has remained weak and confused, though on 8/5 became less interactive, retaining urine, WBC noted to be rising. Blood and urine cultures ordered, ceftriaxone given with bacteriuria and urinary retention. MR brain has not been done yet. Neurology consulted with worsening mental status and nuchal rigidity. LP under fluoro requested, empirically treating meningitis/encephalitis.   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); Cervical discitis/osteomyelitis; UTI (POA); cannot rule out concurrent meninigitis - Neuro, ID, and Neurosurgery consulted and following - Appreciate insight and recommendations -S/P I/D of  06/16/21 - noted copious amounts of pus in the longus colli with myositis, discitis, osteomyelitis, large epidural abscess s/p drainage -cultures consistent with gram-positive staph, speciation pending - Continue empiric Vancomycin and ceftriaxone (ampicillin/acyclovir discontinued by ID) for suspected meningitis/epidural abscess(would cover UTI as  well)  Acute metabolic encephalopathy: Multifactorial, likely also a chronic component.  - Neurology consulted - bedside LP unable to be performed - cultures to be obtained as above with abscess drainage - MRI remarkable for osteo/epidural abscess as above - Empiric coverage with vancomycin, ampicillin, ceftriaxone 2g, and acyclovir.  - Remains NPO due to mental status/interaction per speech -core track placed today, tube feeds to initiate per dietary, this is planned as a temporary route for medications and nutrition until patient's mental status improves back to baseline  UTI, POA:  - Covered by above abx - treatment completed  Ambulatory dysfunction with fall at home, severe weakness:  - Secondary to above - PT/OT recommending SNF rehabilitation once acute infectious process under better control   Alcoholic hepatitis:  - Based on AST predominance of LFT elevations. Hepatitis panel nonreactive.  - No indication for prednisolone at this time. Continue trending. - AST/ALT downtrending - bilirubin increased marginally    Alcohol abuse:  - Per family - 2 beers per day of late, etoh level negative in ED.  - Continue CIWA. Monitor closely for withdrawal. May DC in AM if no need for ativan. - Cessation counseling to be repeated once pt more clear-headed. - Empirically supplementing thiamine, folate, MVM.   Hypokalemia: - Supplemented with resolution   Hyponatremia: Improved with isotonic saline  HTN: - Continue ARB, and increased norvasc.    AKI on stage IIIb CKD:  - Improving - Monitor renal function closely with initiation of vancomycin, acyclovir.  HLD: - Hold statin for now.  DVT prophylaxis: Lovenox 30mg  Code Status: Full Family Communication: None at bedside Disposition Plan:  Status is: Inpatient  Remains inpatient appropriate because:Persistent severe electrolyte disturbances, Altered mental status, and Unsafe d/c plan  Dispo: The patient is from: Home  Anticipated d/c is to: SNF              Patient currently is not medically stable to d/c.   Difficult to place patient No  Consults: Neurology Psychiatry  Procedures:  None  Antimicrobials: Ceftriaxone, Vancomycin  Subjective: No acute issues/events overnight -continues to moan irregularly but much more awake and able to answer simple questions in regards to review of systems denies pain nausea vomiting diarrhea or constipation  Objective: Vitals:   06/17/21 1613 06/17/21 2019 06/17/21 2342 06/18/21 0316  BP: (!) 124/97 99/61 (!) 110/54 114/60  Pulse: 73 70 83 81  Resp: 20 20 20 18   Temp: 98.2 F (36.8 C) (!) 97 F (36.1 C) (!) 97.5 F (36.4 C) 97.6 F (36.4 C)  TempSrc: Axillary Axillary Axillary Axillary  SpO2: 100% 100% 100% 100%  Weight:      Height:        Intake/Output Summary (Last 24 hours) at 06/18/2021 0724 Last data filed at 06/18/2021 0300 Gross per 24 hour  Intake 1247.96 ml  Output 750 ml  Net 497.96 ml    Gen: 75 y.o. female in no distress but yelling out indiscriminantly -able to answer very simple questions with yes no this morning -appears to be appropriate Pulm: Nonlabored breathing room air. Clear. CV: Regular tachycardia without murmur, or gallop. No JVD or edema. GI: Abdominal exam limited due to pt's withdrawal and yelling with any palpation of any part of her body. She does continue to guard diffusely however.  Ext: Warm, no deformities Skin: Anterior neck postoperative surgical site clean dry intact without erythema or purulence Neuro: Asleep but awakes with verbal/tactile stimuli, somewhat noncoherent speech unable to follow commands Psych: Unable to assess  Data Reviewed: I have personally reviewed following labs and imaging studies  CBC: Recent Labs  Lab 06/14/21 1315 06/16/21 0439 06/17/21 0417 06/18/21 0343  WBC 25.3* 21.0* 15.2* 13.3*  HGB 13.7 11.8* 12.2 12.3  HCT 40.7 35.6* 36.9 37.0  MCV 96.0 95.7 96.6 95.9  PLT 205 176  169 140*    Basic Metabolic Panel: Recent Labs  Lab 06/12/21 1558 06/13/21 0306 06/16/21 0439 06/17/21 0417 06/18/21 0343  NA 137 136 154* 158* 157*  K 3.7 4.3 3.2* 2.8* 3.9  CL 102 100 120* 124* 123*  CO2 23 22 19* 21* 17*  GLUCOSE 110* 84 86 84 119*  BUN 31* 40* 80* 79* 102*  CREATININE 1.51* 1.62* 2.44* 2.19* 2.74*  CALCIUM 9.4 9.8 9.1 9.0 8.7*  MG  --  2.1  --   --   --     GFR: Estimated Creatinine Clearance: 19.5 mL/min (A) (by C-G formula based on SCr of 2.74 mg/dL (H)). Liver Function Tests: Recent Labs  Lab 06/12/21 0333 06/14/21 2013  AST 138* 120*  ALT 199* 136*  ALKPHOS 92 104  BILITOT 3.3* 6.5*  PROT 6.4* 6.9  ALBUMIN 1.8* 1.6*    No results for input(s): LIPASE, AMYLASE in the last 168 hours. No results for input(s): AMMONIA in the last 168 hours.  Coagulation Profile: No results for input(s): INR, PROTIME in the last 168 hours.  Cardiac Enzymes: No results for input(s): CKTOTAL, CKMB, CKMBINDEX, TROPONINI in the last 168 hours.  BNP (last 3 results) No results for input(s): PROBNP in the last 8760 hours. HbA1C: No results for input(s): HGBA1C in the last 72 hours. CBG: Recent Labs  Lab 06/16/21 0050 06/16/21 0606 06/16/21 1046 06/16/21 1432 06/16/21 1732  GLUCAP 92 76 78 82  89    Lipid Profile: No results for input(s): CHOL, HDL, LDLCALC, TRIG, CHOLHDL, LDLDIRECT in the last 72 hours. Thyroid Function Tests: No results for input(s): TSH, T4TOTAL, FREET4, T3FREE, THYROIDAB in the last 72 hours.  Anemia Panel: No results for input(s): VITAMINB12, FOLATE, FERRITIN, TIBC, IRON, RETICCTPCT in the last 72 hours.  Urine analysis:    Component Value Date/Time   COLORURINE AMBER (A) 06/14/2021 1338   APPEARANCEUR HAZY (A) 06/14/2021 1338   LABSPEC 1.011 06/14/2021 1338   PHURINE 6.0 06/14/2021 1338   GLUCOSEU NEGATIVE 06/14/2021 1338   HGBUR LARGE (A) 06/14/2021 1338   BILIRUBINUR NEGATIVE 06/14/2021 1338   KETONESUR NEGATIVE  06/14/2021 1338   PROTEINUR NEGATIVE 06/14/2021 1338   UROBILINOGEN 0.2 05/14/2008 0020   NITRITE NEGATIVE 06/14/2021 1338   LEUKOCYTESUR NEGATIVE 06/14/2021 1338   Recent Results (from the past 240 hour(s))  Resp Panel by RT-PCR (Flu A&B, Covid) Nasopharyngeal Swab     Status: None   Collection Time: 06/11/21  3:13 AM   Specimen: Nasopharyngeal Swab; Nasopharyngeal(NP) swabs in vial transport medium  Result Value Ref Range Status   SARS Coronavirus 2 by RT PCR NEGATIVE NEGATIVE Final    Comment: (NOTE) SARS-CoV-2 target nucleic acids are NOT DETECTED.  The SARS-CoV-2 RNA is generally detectable in upper respiratory specimens during the acute phase of infection. The lowest concentration of SARS-CoV-2 viral copies this assay can detect is 138 copies/mL. A negative result does not preclude SARS-Cov-2 infection and should not be used as the sole basis for treatment or other patient management decisions. A negative result may occur with  improper specimen collection/handling, submission of specimen other than nasopharyngeal swab, presence of viral mutation(s) within the areas targeted by this assay, and inadequate number of viral copies(<138 copies/mL). A negative result must be combined with clinical observations, patient history, and epidemiological information. The expected result is Negative.  Fact Sheet for Patients:  BloggerCourse.com  Fact Sheet for Healthcare Providers:  SeriousBroker.it  This test is no t yet approved or cleared by the [place] FDA and  has been authorized for detection and/or diagnosis of SARS-CoV-2 by FDA under an Emergency Use Authorization (EUA). This EUA will remain  in effect (meaning this test can be used) for the duration of the COVID-19 declaration under Section 564(b)(1) of the Act, 21 U.S.C.section 360bbb-3(b)(1), unless the authorization is terminated  or revoked sooner.       Influenza  A by PCR NEGATIVE NEGATIVE Final   Influenza B by PCR NEGATIVE NEGATIVE Final    Comment: (NOTE) The Xpert Xpress SARS-CoV-2/FLU/RSV plus assay is intended as an aid in the diagnosis of influenza from Nasopharyngeal swab specimens and should not be used as a sole basis for treatment. Nasal washings and aspirates are unacceptable for Xpert Xpress SARS-CoV-2/FLU/RSV testing.  Fact Sheet for Patients: BloggerCourse.com  Fact Sheet for Healthcare Providers: SeriousBroker.it  This test is not yet approved or cleared by the [place] FDA and has been authorized for detection and/or diagnosis of SARS-CoV-2 by FDA under an Emergency Use Authorization (EUA). This EUA will remain in effect (meaning this test can be used) for the duration of the COVID-19 declaration under Section 564(b)(1) of the Act, 21 U.S.C. section 360bbb-3(b)(1), unless the authorization is terminated or revoked.  Performed at [facility], [address] [place], [place] [address]   Urine Culture     Status: Abnormal   Collection Time: 06/14/21  1:28 PM   Specimen: Urine, Clean Catch  Result Value Ref Range Status   Specimen Description URINE, CLEAN CATCH  Final   Special Requests   Final    NONE Performed at [facility], [address], [place], [place] [address]    Culture >=100,000 COLONIES/mL STAPHYLOCOCCUS AUREUS (A)  Final   Report Status 06/16/2021 FINAL  Final   Organism ID, Bacteria STAPHYLOCOCCUS AUREUS (A)  Final      Susceptibility   Staphylococcus aureus - MIC*    CIPROFLOXACIN <=0.5 SENSITIVE Sensitive     GENTAMICIN <=0.5 SENSITIVE Sensitive     NITROFURANTOIN <=16 SENSITIVE Sensitive     OXACILLIN 0.5 SENSITIVE Sensitive     TETRACYCLINE <=1 SENSITIVE Sensitive     VANCOMYCIN <=0.5 SENSITIVE Sensitive     TRIMETH/SULFA <=10 SENSITIVE Sensitive     CLINDAMYCIN <=0.25 SENSITIVE Sensitive     RIFAMPIN <=0.5 SENSITIVE Sensitive      Inducible Clindamycin NEGATIVE Sensitive     * >=100,000 COLONIES/mL STAPHYLOCOCCUS AUREUS  Culture, blood (routine x 2)     Status: None (Preliminary result)   Collection Time: 06/14/21  7:40 PM   Specimen: BLOOD  Result Value Ref Range Status   Specimen Description BLOOD RIGHT ANTECUBITAL  Final   Special Requests AEROBIC BOTTLE ONLY Blood Culture adequate volume  Final   Culture   Final    NO GROWTH 4 DAYS Performed at [facility], [address] [place], [place] [address]    Report Status PENDING  Incomplete  Culture, blood (routine x 2)     Status: None (Preliminary result)   Collection Time: 06/14/21  8:13 PM   Specimen: BLOOD RIGHT HAND  Result Value Ref Range Status   Specimen Description BLOOD RIGHT HAND  Final   Special Requests   Final    AEROBIC BOTTLE ONLY Blood Culture results may not be optimal due to an inadequate volume of blood received in culture bottles   Culture   Final    NO GROWTH 4 DAYS Performed at [facility], [address], [place], [place] [address]    Report Status PENDING  Incomplete  Surgical pcr screen     Status: Abnormal   Collection Time: 06/16/21 11:08 AM   Specimen: Nasal Mucosa; Nasal Swab  Result Value Ref Range Status   MRSA, PCR NEGATIVE NEGATIVE Final   Staphylococcus aureus POSITIVE (A) NEGATIVE Final    Comment: (NOTE) The Xpert SA Assay (FDA approved for NASAL specimens in patients 73 years of age and older), is one component of a comprehensive surveillance program. It is not intended to diagnose infection nor to guide or monitor treatment. Performed at [facility], [address] [place], [place] [address]   Aerobic/Anaerobic Culture w Gram Stain (surgical/deep wound)     Status: None (Preliminary result)   Collection Time: 06/16/21 12:42 PM   Specimen: Abscess  Result Value Ref Range Status   Specimen Description ABSCESS  Final   Special Requests EPIDURAL ABSCESS  Final   Gram Stain   Final    FEW  WBC PRESENT,BOTH PMN AND MONONUCLEAR FEW GRAM POSITIVE COCCI    Culture   Final    ABUNDANT STAPHYLOCOCCUS AUREUS SUSCEPTIBILITIES TO FOLLOW Performed at [facility], [address] [place], [place] [address]    Report Status PENDING  Incomplete      Radiology Studies: DG Cervical Spine 1 View  Result Date: 06/16/2021 CLINICAL DATA:  Neck pain EXAM: DG CERVICAL SPINE - 1 VIEW;  DG C-ARM 1-60 MIN COMPARISON:  MRI 06/16/2021 FINDINGS: Single view intraoperative radiograph of the cervical spine demonstrates cervical spine from the occiput foot through C5-6. C5-6 anterior cervical discectomy and fusion with instrumentation has been performed. Normal alignment. Ray-Tec is seen within the soft tissues anterior to the fusion. Endotracheal tube is in place. Fluoroscopy time: 3 seconds Dose: 0.25 mGy Images: 1 IMPRESSION: ACDF C5-6.  Ray-Tec in place within the anterior soft tissues. Electronically Signed   By: Helyn Numbers MD   On: 06/16/2021 15:04   DG Abd Portable 1V  Result Date: 06/17/2021 CLINICAL DATA:  Feeding tube placement. EXAM: PORTABLE ABDOMEN - 1 VIEW COMPARISON:  None. FINDINGS: Tip of the weighted enteric tube in the right upper quadrant in the region of the distal stomach. There is air within nondilated bowel loops in the upper abdomen. No obvious free air. IMPRESSION: Tip of the weighted enteric tube in the right upper quadrant in the region of the distal stomach. Electronically Signed   By: Narda Rutherford M.D.   On: 06/17/2021 15:01   DG C-Arm 1-60 Min  Result Date: 06/16/2021 CLINICAL DATA:  Neck pain EXAM: DG CERVICAL SPINE - 1 VIEW; DG C-ARM 1-60 MIN COMPARISON:  MRI 06/16/2021 FINDINGS: Single view intraoperative radiograph of the cervical spine demonstrates cervical spine from the occiput foot through C5-6. C5-6 anterior cervical discectomy and fusion with instrumentation has been performed. Normal alignment. Ray-Tec is seen within the soft tissues anterior to the fusion.  Endotracheal tube is in place. Fluoroscopy time: 3 seconds Dose: 0.25 mGy Images: 1 IMPRESSION: ACDF C5-6.  Ray-Tec in place within the anterior soft tissues. Electronically Signed   By: Helyn Numbers MD   On: 06/16/2021 15:04    Scheduled Meds:  allopurinol  300 mg Oral Daily   amLODipine  10 mg Oral Daily   aspirin  81 mg Oral Daily   Chlorhexidine Gluconate Cloth  6 each Topical Daily   feeding supplement (OSMOLITE 1.2 CAL)  1,000 mL Per Tube Q24H   feeding supplement (PROSource TF)  45 mL Per Tube BID   folic acid  1 mg Oral Daily   free water  115 mL Per Tube Q4H   irbesartan  150 mg Oral Daily   multivitamin with minerals  1 tablet Oral Daily   [START ON 06/22/2021] thiamine injection  100 mg Intravenous Daily   Continuous Infusions:  sodium chloride Stopped (06/16/21 0757)   cefTRIAXone (ROCEPHIN)  IV 2 g (06/17/21 2029)   thiamine injection Stopped (06/17/21 1145)   vancomycin Stopped (06/17/21 1800)     LOS: 7 days   Time spent: 35 minutes.  Azucena Fallen, DO [facility] Pager: Secure chat  06/18/2021, 7:24 AM

## 2021-06-19 ENCOUNTER — Inpatient Hospital Stay (HOSPITAL_COMMUNITY): Payer: Medicare Other

## 2021-06-19 DIAGNOSIS — M7989 Other specified soft tissue disorders: Secondary | ICD-10-CM

## 2021-06-19 DIAGNOSIS — M79602 Pain in left arm: Secondary | ICD-10-CM

## 2021-06-19 LAB — BASIC METABOLIC PANEL
Anion gap: 14 (ref 5–15)
BUN: 114 mg/dL — ABNORMAL HIGH (ref 8–23)
CO2: 20 mmol/L — ABNORMAL LOW (ref 22–32)
Calcium: 8.6 mg/dL — ABNORMAL LOW (ref 8.9–10.3)
Chloride: 130 mmol/L — ABNORMAL HIGH (ref 98–111)
Creatinine, Ser: 2.53 mg/dL — ABNORMAL HIGH (ref 0.44–1.00)
GFR, Estimated: 19 mL/min — ABNORMAL LOW (ref >60)
Glucose, Bld: 139 mg/dL — ABNORMAL HIGH (ref 70–99)
Potassium: 4.2 mmol/L (ref 3.5–5.1)
Sodium: 164 mmol/L (ref 135–145)

## 2021-06-19 LAB — CULTURE, BLOOD (ROUTINE X 2)
Culture: NO GROWTH
Culture: NO GROWTH
Special Requests: ADEQUATE

## 2021-06-19 LAB — CBC
HCT: 35.7 % — ABNORMAL LOW (ref 36.0–46.0)
Hemoglobin: 11.8 g/dL — ABNORMAL LOW (ref 12.0–15.0)
MCH: 32.8 pg (ref 26.0–34.0)
MCHC: 33.1 g/dL (ref 30.0–36.0)
MCV: 99.2 fL (ref 80.0–100.0)
Platelets: 129 10*3/uL — ABNORMAL LOW (ref 150–400)
RBC: 3.6 MIL/uL — ABNORMAL LOW (ref 3.87–5.11)
RDW: 15.1 % (ref 11.5–15.5)
WBC: 14.3 10*3/uL — ABNORMAL HIGH (ref 4.0–10.5)
nRBC: 0.2 % (ref 0.0–0.2)

## 2021-06-19 LAB — SEDIMENTATION RATE: Sed Rate: 117 mm/hr — ABNORMAL HIGH (ref 0–22)

## 2021-06-19 LAB — HEPATIC FUNCTION PANEL
ALT: 150 U/L — ABNORMAL HIGH (ref 0–44)
AST: 168 U/L — ABNORMAL HIGH (ref 15–41)
Albumin: 1.2 g/dL — ABNORMAL LOW (ref 3.5–5.0)
Alkaline Phosphatase: 133 U/L — ABNORMAL HIGH (ref 38–126)
Bilirubin, Direct: 1 mg/dL — ABNORMAL HIGH (ref 0.0–0.2)
Indirect Bilirubin: 2 mg/dL — ABNORMAL HIGH (ref 0.3–0.9)
Total Bilirubin: 3 mg/dL — ABNORMAL HIGH (ref 0.3–1.2)
Total Protein: 5.7 g/dL — ABNORMAL LOW (ref 6.5–8.1)

## 2021-06-19 LAB — GLUCOSE, CAPILLARY
Glucose-Capillary: 125 mg/dL — ABNORMAL HIGH (ref 70–99)
Glucose-Capillary: 114 mg/dL — ABNORMAL HIGH (ref 70–99)
Glucose-Capillary: 169 mg/dL — ABNORMAL HIGH (ref 70–99)

## 2021-06-19 MED ORDER — DEXTROSE 5 % IV BOLUS: 500.0000 mL | Freq: Once | INTRAVENOUS | Status: DC

## 2021-06-19 NOTE — Progress Notes (Signed)
[facility] Infectious Diseases Follow Up Note  Patient Identification: Patient Name: Kristy [name] MRN: [id number] [place] Date: 06/10/2021  8:56 PM Age: 75 y.o.Today's Date: 06/19/2021   Reason for Visit: Epidural abscess  Principal Problem:   Acute delirium Active Problems:   Fall   Alcoholic hepatitis   Altered mental status   Sepsis ([place])   Malnutrition of moderate degree  Antibiotics: nafcillin 8/9-current  Acyclovir 8/5-8/6 Ampicillin 8/5 - 8/8 Ceftriaxone 8/5-8/9 Vancomycin 8/6-8/8   Lines/Tubes: PIV external uretheral catheter, feeding tube   Interval Events: Continues to be afebrile, WBC is at 14.3, somewhat stable, platelets downtrending.  Creatinine improving from 2.7-2.5.  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  Of note, her creatinine was 2.74 yesterday before starting nafcillin which has down trended to 2.53 today.  Follow-up abscess cultures Monitor CBC and CMP Baseline ESR and CRP ( ordered)  I have ordered ultrasound of left upper extremity (swelling and pain of left upper extremity)  Plan discussed with patient/RN/ID pharmacy and Primary  Rest of the management as per the primary team. Thank you for the consult. Please page with pertinent questions or  concerns.  ______________________________________________________________________ Subjective patient seen and examined at the bedside.  She is lying in the bed and moaning.  Spoke with her RN at the bedside who tells me that she responds to touch and verbal stimuli.   Vitals BP 124/78   Pulse 98   Temp 98.1 F (36.7 C) (Axillary)   Resp 20   Ht _0  (1.676 m)   Wt 85.5 kg   SpO2 100%   BMI 30.42 kg/m     Physical Exam Constitutional: Elderly female lying in bed, she is moaning    Comments: Has a feeding tube  Cardiovascular:     Rate and Rhythm: Normal rate and regular rhythm.     Heart sounds:   Pulmonary:     Effort: Pulmonary effort is normal.     Comments:   Abdominal:     Palpations: Abdomen is soft.     Tenderness: Nontender and nondistended  Musculoskeletal:        General: Left upper extremity swollen and tender  Skin:    Comments: No obvious lesions or rashes  Neurological:     General: unable to assess, plus bilaterally symmetrical and reactive  Psychiatric:        Mood and Affect: cannot assess   Pertinent Microbiology Results for orders placed or performed during the hospital encounter of 06/10/21  Resp Panel by RT-PCR (Flu A&B, Covid) Nasopharyngeal Swab     Status: None   Collection Time: 06/11/21  3:13 AM   Specimen: Nasopharyngeal Swab; Nasopharyngeal(NP) swabs in vial transport medium  Result Value Ref Range Status   SARS Coronavirus 2 by RT PCR NEGATIVE NEGATIVE Final    Comment: (NOTE) SARS-CoV-2 target nucleic acids are NOT DETECTED.  The SARS-CoV-2 RNA is generally detectable in upper respiratory specimens  during the acute phase of infection. The lowest concentration of SARS-CoV-2 viral copies this assay can detect is 138 copies/mL. A negative result does not preclude SARS-Cov-2 infection and should not be used as the sole basis for treatment or other patient management decisions. A negative result may occur with  improper specimen  collection/handling, submission of specimen other than nasopharyngeal swab, presence of viral mutation(s) within the areas targeted by this assay, and inadequate number of viral copies(<138 copies/mL). A negative result must be combined with clinical observations, patient history, and epidemiological information. The expected result is Negative.  Fact Sheet for Patients:  EntrepreneurPulse.com.au  Fact Sheet for Healthcare Providers:  IncredibleEmployment.be  This test is no t yet approved or cleared by the [place] FDA and  has been authorized for detection and/or diagnosis of SARS-CoV-2 by FDA under an Emergency Use Authorization (EUA). This EUA will remain  in effect (meaning this test can be used) for the duration of the COVID-19 declaration under Section 564(b)(1) of the Act, 21 U.S.C.section 360bbb-3(b)(1), unless the authorization is terminated  or revoked sooner.       Influenza A by PCR NEGATIVE NEGATIVE Final   Influenza B by PCR NEGATIVE NEGATIVE Final    Comment: (NOTE) The Xpert Xpress SARS-CoV-2/FLU/RSV plus assay is intended as an aid in the diagnosis of influenza from Nasopharyngeal swab specimens and should not be used as a sole basis for treatment. Nasal washings and aspirates are unacceptable for Xpert Xpress SARS-CoV-2/FLU/RSV testing.  Fact Sheet for Patients: EntrepreneurPulse.com.au  Fact Sheet for Healthcare Providers: IncredibleEmployment.be  This test is not yet approved or cleared by the [place] FDA and has been authorized for detection and/or diagnosis of SARS-CoV-2 by FDA under an Emergency Use Authorization (EUA). This EUA will remain in effect (meaning this test can be used) for the duration of the COVID-19 declaration under Section 564(b)(1) of the Act, 21 U.S.C. section 360bbb-3(b)(1), unless the authorization is terminated or revoked.  Performed at [facility], [place] [address] [place], [place] [address]   Urine Culture     Status: Abnormal   Collection Time: 06/14/21  1:28 PM   Specimen: Urine, Clean Catch  Result Value Ref Range Status   Specimen Description URINE, CLEAN CATCH  Final   Special Requests   Final    NONE Performed at [facility], [place] [address], [place], [place] [address]    Culture >=100,000 COLONIES/mL STAPHYLOCOCCUS AUREUS (A)  Final   Report Status 06/16/2021 FINAL  Final   Organism ID, Bacteria STAPHYLOCOCCUS AUREUS (A)  Final      Susceptibility   Staphylococcus aureus - MIC*    CIPROFLOXACIN <=0.5 SENSITIVE Sensitive     GENTAMICIN <=0.5 SENSITIVE Sensitive     NITROFURANTOIN <=16 SENSITIVE Sensitive     OXACILLIN 0.5 SENSITIVE Sensitive     TETRACYCLINE <=1 SENSITIVE Sensitive     VANCOMYCIN <=0.5 SENSITIVE Sensitive     TRIMETH/SULFA <=10 SENSITIVE Sensitive     CLINDAMYCIN <=0.25 SENSITIVE Sensitive     RIFAMPIN <=0.5 SENSITIVE Sensitive     Inducible Clindamycin NEGATIVE Sensitive     * >=100,000 COLONIES/mL STAPHYLOCOCCUS AUREUS  Culture, blood (routine x 2)     Status: None (Preliminary result)   Collection Time: 06/14/21  7:40 PM   Specimen: BLOOD  Result Value Ref Range Status   Specimen Description BLOOD RIGHT ANTECUBITAL  Final   Special Requests AEROBIC BOTTLE ONLY Blood Culture adequate volume  Final   Culture  Final    NO GROWTH 4 DAYS Performed at [facility], [place] [address], [place], [place] [address]    Report Status PENDING  Incomplete  Culture, blood (routine x 2)     Status: None (Preliminary result)   Collection Time: 06/14/21  8:13 PM   Specimen: BLOOD RIGHT HAND  Result Value Ref Range Status   Specimen Description BLOOD RIGHT HAND  Final   Special Requests   Final    AEROBIC BOTTLE ONLY Blood Culture results may not be optimal due to an inadequate volume of blood received in culture bottles   Culture   Final    NO GROWTH 4 DAYS Performed at [facility], [place] [address], [place], [place] [address]    Report Status PENDING  Incomplete  Surgical pcr screen     Status: Abnormal   Collection Time: 06/16/21 11:08 AM   Specimen: Nasal Mucosa; Nasal Swab  Result Value Ref Range Status   MRSA, PCR NEGATIVE NEGATIVE Final   Staphylococcus aureus POSITIVE (A) NEGATIVE Final    Comment: (NOTE) The Xpert SA Assay (FDA approved for NASAL specimens in patients 25 years of age and older), is one component of a comprehensive surveillance program. It is not intended to diagnose infection nor to guide or monitor treatment. Performed at [facility], [place] [address] [place], [place] [address]   Aerobic/Anaerobic Culture w Gram Stain (surgical/deep wound)     Status: None (Preliminary result)   Collection Time: 06/16/21 12:42 PM   Specimen: Abscess  Result Value Ref Range Status   Specimen Description ABSCESS  Final   Special Requests EPIDURAL ABSCESS  Final   Gram Stain   Final    FEW WBC PRESENT,BOTH PMN AND MONONUCLEAR FEW GRAM POSITIVE COCCI Performed at [facility], [address], [place], [place] [address]    Culture   Final    ABUNDANT STAPHYLOCOCCUS AUREUS NO ANAEROBES ISOLATED; CULTURE IN PROGRESS FOR 5 DAYS    Report Status PENDING  Incomplete   Organism ID, Bacteria STAPHYLOCOCCUS AUREUS  Final      Susceptibility   Staphylococcus aureus - MIC*    CIPROFLOXACIN <=0.5 SENSITIVE Sensitive     ERYTHROMYCIN <=0.25 SENSITIVE Sensitive     GENTAMICIN <=0.5 SENSITIVE Sensitive     OXACILLIN 0.5 SENSITIVE Sensitive     TETRACYCLINE <=1 SENSITIVE Sensitive     VANCOMYCIN 1 SENSITIVE Sensitive     TRIMETH/SULFA <=10 SENSITIVE Sensitive     CLINDAMYCIN <=0.25 SENSITIVE Sensitive     RIFAMPIN <=0.5 SENSITIVE Sensitive     Inducible Clindamycin NEGATIVE Sensitive     * ABUNDANT STAPHYLOCOCCUS AUREUS      Pertinent Lab. CBC Latest Ref Rng & Units 06/19/2021 06/18/2021 06/17/2021  WBC 4.0 - 10.5 K/uL 14.3(H) 13.3(H)  15.2(H)  Hemoglobin 12.0 - 15.0 g/dL 11.8(L) 12.3 12.2  Hematocrit 36.0 - 46.0 % 35.7(L) 37.0 36.9  Platelets 150 - 400 K/uL 129(L) 140(L) 169   CMP Latest Ref Rng & Units 06/19/2021 06/18/2021 06/17/2021  Glucose 70 - 99 mg/dL 139(H) 119(H) 84  BUN 8 - 23 mg/dL 114(H) 102(H) 79(H)  Creatinine 0.44 - 1.00 mg/dL 2.53(H) 2.74(H) 2.19(H)  Sodium 135 - 145 mmol/L 164(HH) 157(H) 158(H)  Potassium 3.5 - 5.1 mmol/L 4.2 3.9 2.8(L)  Chloride 98 - 111 mmol/L 130(H) 123(H) 124(H)  CO2 22 - 32 mmol/L 20(L) 17(L) 21(L)  Calcium 8.9 - 10.3 mg/dL 8.6(L) 8.7(L) 9.0  Total Protein 6.5 -  8.1 g/dL - - -  Total Bilirubin 0.3 - 1.2 mg/dL - - -  Alkaline Phos 38 - 126 U/L - - -  AST 15 - 41 U/L - - -  ALT 0 - 44 U/L - - -     Pertinent Imaging today Plain films and CT images have been personally visualized and interpreted; radiology reports have been reviewed. Decision making incorporated into the Impression / Recommendations.  TTE 06/18/21 Left ventricular ejection fraction, by estimation, is 65 to 70%. The left ventricle has normal function. The left ventricle has no regional wall motion abnormalities. Left ventricular diastolic parameters are consistent with Grade I diastolic dysfunction (impaired relaxation). 1. Right ventricular systolic function is normal. The right ventricular size is normal. There is normal pulmonary artery systolic pressure. 2. The mitral valve is normal in structure. Mild mitral valve regurgitation. No evidence of mitral stenosis. 3. The aortic valve is normal in structure. Aortic valve regurgitation is mild. No aortic stenosis is present. 4. The inferior vena cava is normal in size with greater than 50% respiratory variability, suggesting right atrial pressure of 3 mmHg.   I spent more than 35 minutes for this patient encounter including review of prior medical records, coordination of care  with greater than 50% of time being face to face/counseling and discussing  diagnostics/treatment plan with the patient/family.  Electronically signed by:   Rosiland Oz, MD Infectious Disease Physician [facility] for Infectious Disease Pager: [phone]

## 2021-06-19 NOTE — Progress Notes (Signed)
PT Cancellation Note  Patient Details Name: Kristy [name] MRN: [id number] DOB: 1946-06-30   Cancelled Treatment:    Reason Eval/Treat Not Completed: Other (comment) Spoke with RN and pt still unable to participate with PT.  She is not following commands and still painful - reports some improvement since prior PT session, but not able to meaningfully participate.  Will f/u at later date.  Anise Salvo, PT Acute Rehab Services Pager [phone] [facility] [phone]    Rayetta Humphrey 06/19/2021, 11:58 AM

## 2021-06-19 NOTE — Progress Notes (Signed)
LUE venous duplex has been completed.  Results can be found under chart review under CV PROC. 06/19/2021 4:20 PM Corrigan Kretschmer RVT, RDMS

## 2021-06-19 NOTE — Progress Notes (Signed)
OT Cancellation Note  Patient Details Name: Kristy [name] MRN: [id number] DOB: 01-Jan-1946   Cancelled Treatment:    Reason Eval/Treat Not Completed: Other (comment) Nsg requesting to hold today duet o pain levels and mentation/lethargy. Will follow up later time.  Thornell Mule, OT/L   Acute OT Clinical Specialist Acute Rehabilitation Services Pager [phone] Office [phone]  06/19/2021, 2:04 PM

## 2021-06-19 NOTE — Progress Notes (Signed)
SLP Cancellation Note  Patient Details Name: Kristy [name] MRN: [id number] DOB: 1946-03-05   Cancelled treatment:       Reason Eval/Treat Not Completed: Other (comment). Spoke with RN who reports that pt continues to be impacted by pain levels and mentation/lethargy. SLP to s/o for now. Please re-consult when pt's mentation improves for ability to take POs.     Avie Echevaria, MA, CCC-SLP Acute Rehabilitation Services Office Number: [phone]  Paulette Blanch 06/19/2021, 12:59 PM

## 2021-06-19 NOTE — Progress Notes (Addendum)
PROGRESS NOTE  MANISHA CANCEL  ZOX:[id number] DOB: August 09, 1946 DOA: 06/10/2021 PCP: Darrow Bussing, MD   Brief Narrative: Kristy [name] is a 75 y.o. female with a history of alcohol use who presented to the ED for frequent falls, generalized weakness, and confusion. She was found to be afebrile, hypertensive, and without hypoxia. Also had hypokalemia (K 2.6), SCr 1.88 (above baseline ~1.5), LFT elevation (AST 576, ALT 305). Ammonia normal. EtOH, salicylate, tylenol levels negative. CT head and cervical spine were reassuring, though the patient was unable to rise to seated position from laying prompting consultation for admission. The patient was given potassium supplementation, started on CIWA protocol, and admitted with note made of continued AMS. She has remained weak and confused, though on 8/5 became less interactive, retaining urine, WBC noted to be rising. Blood and urine cultures ordered, ceftriaxone given with bacteriuria and urinary retention. MR brain has not been done yet. Neurology consulted with worsening mental status and nuchal rigidity. LP under fluoro requested, empirically treating meningitis/encephalitis.   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); Cervical discitis/osteomyelitis; UTI (POA); cannot rule out concurrent meninigitis - Neuro, ID, and Neurosurgery consulted and following - Appreciate insight and recommendations -TTE pending given gram-positive staph notable in both urine culture and on surgical culture of the neck, ID concerned for possible systemic or central etiology -S/P I/D of  06/16/21 - noted copious amounts of pus in the longus colli with myositis, discitis, osteomyelitis, large epidural abscess s/p drainage -cultures consistent with gram-positive staph, speciation  pending - Continue empiric Vancomycin and ceftriaxone (ampicillin/acyclovir discontinued by ID) for suspected meningitis/epidural abscess(would cover UTI as well)  Acute metabolic encephalopathy: Multifactorial, likely also a chronic component.  - Neurology consulted - bedside LP unable to be performed - cultures to be obtained as above with abscess drainage - MRI remarkable for osteo/epidural abscess as above - Empiric coverage with vancomycin, ampicillin, ceftriaxone 2g, and acyclovir.  - Remains NPO due to mental status/interaction per speech -core track placed today, tube feeds to initiate per dietary, this is planned as a temporary route for medications and nutrition until patient's mental status improves back to baseline  UTI, POA:  - Covered by above abx - treatment completed  Ambulatory dysfunction with fall at home, severe weakness:  - Secondary to above - PT/OT recommending SNF rehabilitation once acute infectious process under better control   Alcoholic hepatitis:  - Based on AST predominance of LFT elevations. Hepatitis panel nonreactive.  - No indication for prednisolone at this time. Continue trending. - AST/ALT downtrending - bilirubin increased marginally    Alcohol abuse:  - Per family - 2 beers per day of late, etoh level negative in ED.  - Continue CIWA. Monitor closely for withdrawal. May DC in AM if no need for ativan. - Cessation counseling to be repeated once pt more clear-headed. - Empirically supplementing thiamine, folate, MVM.   Hypokalemia: - Supplemented with resolution   Hypovolemic/hyponatremia, resolved -currently hypernatremic NS discontinued previously, NG tube feedings and free water flushes per dietary D5W 500cc x1 today over 5 hours; increase free water flushes if not downtrending appropriately  Severe protein caloric malnutrition  -Able to gain definitive access with NG tube, initiate tube feeds and free water flushes per dietary -Albumin  continues to downtrend despite increased  tube feeds  HTN: - Continue ARB, and increased norvasc.    AKI on stage IIIb CKD:  -Labile in the setting of poor p.o. intake, IV fluids and initiation of free water flushes-we will titrate accordingly - Monitor renal function closely with initiation of vancomycin, acyclovir.  HLD: - Hold statin for now.  DVT prophylaxis: Lovenox 30mg  Code Status: Full Family Communication: None at bedside Disposition Plan:  Status is: Inpatient  Remains inpatient appropriate because:Persistent severe electrolyte disturbances, Altered mental status, and Unsafe d/c plan  Dispo: The patient is from: Home              Anticipated d/c is to: SNF              Patient currently is not medically stable to d/c.   Difficult to place patient No  Consults: Neurology Psychiatry  Procedures:  None  Antimicrobials: Ceftriaxone, Vancomycin  Subjective: No acute issues/events overnight -patient continues to moan with some irregularity but more consistent now and less frequent than previous, able to shake her head yes no to simple questions but unclear if these are meaningful or appropriate as her answers change despite repeating the same question.  Objective: Vitals:   06/18/21 2328 06/18/21 2356 06/19/21 0350 06/19/21 0729  BP: (!) 114/52 127/67 123/64 124/78  Pulse: 98 100 98 98  Resp: 18 20 20 20   Temp: 97.7 F (36.5 C) 97.8 F (36.6 C) 98 F (36.7 C) 98.1 F (36.7 C)  TempSrc: Axillary Axillary Axillary Axillary  SpO2: 97% 99% 99% 100%  Weight:      Height:        Intake/Output Summary (Last 24 hours) at 06/19/2021 0736 Last data filed at 06/19/2021 08/19/2021 Gross per 24 hour  Intake 1673.39 ml  Output 1850 ml  Net -176.61 ml    Gen: 75 y.o. female in no distress but yelling out indiscriminantly -nods head yes no to simple questions somewhat inappropriately Pulm: Nonlabored breathing room air. Clear. CV: Regular tachycardia without murmur, or  gallop. No JVD or edema. GI: Abdominal exam limited due to pt's withdrawal and yelling with any palpation of any part of her body. She does continue to guard diffusely however.  Ext: Warm, no deformities Skin: Anterior neck postoperative surgical site clean dry intact without erythema or purulence Neuro: Asleep but awakes with verbal/tactile stimuli, noncoherent/garbled speech - unable to follow simple commands Psych: Unable to assess  Data Reviewed: I have personally reviewed following labs and imaging studies  CBC: Recent Labs  Lab 06/14/21 1315 06/16/21 0439 06/17/21 0417 06/18/21 0343 06/19/21 0412  WBC 25.3* 21.0* 15.2* 13.3* 14.3*  HGB 13.7 11.8* 12.2 12.3 11.8*  HCT 40.7 35.6* 36.9 37.0 35.7*  MCV 96.0 95.7 96.6 95.9 99.2  PLT 205 176 169 140* 129*    Basic Metabolic Panel: Recent Labs  Lab 06/13/21 0306 06/16/21 0439 06/17/21 0417 06/18/21 0343 06/19/21 0412  NA 136 154* 158* 157* 164*  K 4.3 3.2* 2.8* 3.9 4.2  CL 100 120* 124* 123* 130*  CO2 22 19* 21* 17* 20*  GLUCOSE 84 86 84 119* 139*  BUN 40* 80* 79* 102* 114*  CREATININE 1.62* 2.44* 2.19* 2.74* 2.53*  CALCIUM 9.8 9.1 9.0 8.7* 8.6*  MG 2.1  --   --   --   --     GFR: Estimated Creatinine Clearance: 21.2 mL/min (A) (by C-G formula based on SCr of 2.53 mg/dL (H)). Liver Function Tests: Recent Labs  Lab 06/14/21 2013  AST 120*  ALT 136*  ALKPHOS 104  BILITOT 6.5*  PROT 6.9  ALBUMIN 1.6*    No results for input(s): LIPASE, AMYLASE in the last 168 hours. No results for input(s): AMMONIA in the last 168 hours.  Coagulation Profile: No results for input(s): INR, PROTIME in the last 168 hours.  Cardiac Enzymes: No results for input(s): CKTOTAL, CKMB, CKMBINDEX, TROPONINI in the last 168 hours.  BNP (last 3 results) No results for input(s): PROBNP in the last 8760 hours. HbA1C: No results for input(s): HGBA1C in the last 72 hours. CBG: Recent Labs  Lab 06/16/21 1432 06/16/21 1732  06/18/21 0738 06/18/21 1220 06/18/21 1608  GLUCAP 82 89 103* 75 113*    Lipid Profile: No results for input(s): CHOL, HDL, LDLCALC, TRIG, CHOLHDL, LDLDIRECT in the last 72 hours. Thyroid Function Tests: No results for input(s): TSH, T4TOTAL, FREET4, T3FREE, THYROIDAB in the last 72 hours.  Anemia Panel: No results for input(s): VITAMINB12, FOLATE, FERRITIN, TIBC, IRON, RETICCTPCT in the last 72 hours.  Urine analysis:    Component Value Date/Time   COLORURINE AMBER (A) 06/14/2021 1338   APPEARANCEUR HAZY (A) 06/14/2021 1338   LABSPEC 1.011 06/14/2021 1338   PHURINE 6.0 06/14/2021 1338   GLUCOSEU NEGATIVE 06/14/2021 1338   HGBUR LARGE (A) 06/14/2021 1338   BILIRUBINUR NEGATIVE 06/14/2021 1338   KETONESUR NEGATIVE 06/14/2021 1338   PROTEINUR NEGATIVE 06/14/2021 1338   UROBILINOGEN 0.2 05/14/2008 0020   NITRITE NEGATIVE 06/14/2021 1338   LEUKOCYTESUR NEGATIVE 06/14/2021 1338   Recent Results (from the past 240 hour(s))  Resp Panel by RT-PCR (Flu A&B, Covid) Nasopharyngeal Swab     Status: None   Collection Time: 06/11/21  3:13 AM   Specimen: Nasopharyngeal Swab; Nasopharyngeal(NP) swabs in vial transport medium  Result Value Ref Range Status   SARS Coronavirus 2 by RT PCR NEGATIVE NEGATIVE Final    Comment: (NOTE) SARS-CoV-2 target nucleic acids are NOT DETECTED.  The SARS-CoV-2 RNA is generally detectable in upper respiratory specimens during the acute phase of infection. The lowest concentration of SARS-CoV-2 viral copies this assay can detect is 138 copies/mL. A negative result does not preclude SARS-Cov-2 infection and should not be used as the sole basis for treatment or other patient management decisions. A negative result may occur with  improper specimen collection/handling, submission of specimen other than nasopharyngeal swab, presence of viral mutation(s) within the areas targeted by this assay, and inadequate number of viral copies(<138 copies/mL). A negative  result must be combined with clinical observations, patient history, and epidemiological information. The expected result is Negative.  Fact Sheet for Patients:  BloggerCourse.com  Fact Sheet for Healthcare Providers:  SeriousBroker.it  This test is no t yet approved or cleared by the [place] FDA and  has been authorized for detection and/or diagnosis of SARS-CoV-2 by FDA under an Emergency Use Authorization (EUA). This EUA will remain  in effect (meaning this test can be used) for the duration of the COVID-19 declaration under Section 564(b)(1) of the Act, 21 U.S.C.section 360bbb-3(b)(1), unless the authorization is terminated  or revoked sooner.       Influenza A by PCR NEGATIVE NEGATIVE Final   Influenza B by PCR NEGATIVE NEGATIVE Final    Comment: (NOTE) The Xpert Xpress SARS-CoV-2/FLU/RSV plus assay is intended as an aid in the diagnosis of influenza from Nasopharyngeal swab specimens and should not be used as a sole basis for treatment. Nasal washings and aspirates are unacceptable for Xpert Xpress SARS-CoV-2/FLU/RSV testing.  Fact Sheet for Patients:  BloggerCourse.com  Fact Sheet for Healthcare Providers: SeriousBroker.it  This test is not yet approved or cleared by the [place] FDA and has been authorized for detection and/or diagnosis of SARS-CoV-2 by FDA under an Emergency Use Authorization (EUA). This EUA will remain in effect (meaning this test can be used) for the duration of the COVID-19 declaration under Section 564(b)(1) of the Act, 21 U.S.C. section 360bbb-3(b)(1), unless the authorization is terminated or revoked.  Performed at [facility], [address], [place], [place] [address]   Urine Culture     Status: Abnormal   Collection Time: 06/14/21  1:28 PM   Specimen: Urine, Clean Catch  Result Value Ref Range Status   Specimen  Description URINE, CLEAN CATCH  Final   Special Requests   Final    NONE Performed at [facility], [address] [place], [place] [address]    Culture >=100,000 COLONIES/mL STAPHYLOCOCCUS AUREUS (A)  Final   Report Status 06/16/2021 FINAL  Final   Organism ID, Bacteria STAPHYLOCOCCUS AUREUS (A)  Final      Susceptibility   Staphylococcus aureus - MIC*    CIPROFLOXACIN <=0.5 SENSITIVE Sensitive     GENTAMICIN <=0.5 SENSITIVE Sensitive     NITROFURANTOIN <=16 SENSITIVE Sensitive     OXACILLIN 0.5 SENSITIVE Sensitive     TETRACYCLINE <=1 SENSITIVE Sensitive     VANCOMYCIN <=0.5 SENSITIVE Sensitive     TRIMETH/SULFA <=10 SENSITIVE Sensitive     CLINDAMYCIN <=0.25 SENSITIVE Sensitive     RIFAMPIN <=0.5 SENSITIVE Sensitive     Inducible Clindamycin NEGATIVE Sensitive     * >=100,000 COLONIES/mL STAPHYLOCOCCUS AUREUS  Culture, blood (routine x 2)     Status: None (Preliminary result)   Collection Time: 06/14/21  7:40 PM   Specimen: BLOOD  Result Value Ref Range Status   Specimen Description BLOOD RIGHT ANTECUBITAL  Final   Special Requests AEROBIC BOTTLE ONLY Blood Culture adequate volume  Final   Culture   Final    NO GROWTH 4 DAYS Performed at [facility], [name], [place], [place] [address]    Report Status PENDING  Incomplete  Culture, blood (routine x 2)     Status: None (Preliminary result)   Collection Time: 06/14/21  8:13 PM   Specimen: BLOOD RIGHT HAND  Result Value Ref Range Status   Specimen Description BLOOD RIGHT HAND  Final   Special Requests   Final    AEROBIC BOTTLE ONLY Blood Culture results may not be optimal due to an inadequate volume of blood received in culture bottles   Culture   Final    NO GROWTH 4 DAYS Performed at [facility], [address], [place], [place] [address]    Report Status PENDING  Incomplete  Surgical pcr screen     Status: Abnormal   Collection Time: 06/16/21 11:08 AM   Specimen: Nasal Mucosa; Nasal Swab   Result Value Ref Range Status   MRSA, PCR NEGATIVE NEGATIVE Final   Staphylococcus aureus POSITIVE (A) NEGATIVE Final    Comment: (NOTE) The Xpert SA Assay (FDA approved for NASAL specimens in patients 22 years of age and older), is one component of a comprehensive surveillance program. It is not intended to diagnose infection nor to guide or monitor treatment. Performed at [facility], [address], [place], [place] [address]   Aerobic/Anaerobic Culture w Gram Stain (surgical/deep wound)     Status: None (Preliminary result)  Collection Time: 06/16/21 12:42 PM   Specimen: Abscess  Result Value Ref Range Status   Specimen Description ABSCESS  Final   Special Requests EPIDURAL ABSCESS  Final   Gram Stain   Final    FEW WBC PRESENT,BOTH PMN AND MONONUCLEAR FEW GRAM POSITIVE COCCI Performed at [facility], [address] [place], [place] [address]    Culture   Final    ABUNDANT STAPHYLOCOCCUS AUREUS NO ANAEROBES ISOLATED; CULTURE IN PROGRESS FOR 5 DAYS    Report Status PENDING  Incomplete   Organism ID, Bacteria STAPHYLOCOCCUS AUREUS  Final      Susceptibility   Staphylococcus aureus - MIC*    CIPROFLOXACIN <=0.5 SENSITIVE Sensitive     ERYTHROMYCIN <=0.25 SENSITIVE Sensitive     GENTAMICIN <=0.5 SENSITIVE Sensitive     OXACILLIN 0.5 SENSITIVE Sensitive     TETRACYCLINE <=1 SENSITIVE Sensitive     VANCOMYCIN 1 SENSITIVE Sensitive     TRIMETH/SULFA <=10 SENSITIVE Sensitive     CLINDAMYCIN <=0.25 SENSITIVE Sensitive     RIFAMPIN <=0.5 SENSITIVE Sensitive     Inducible Clindamycin NEGATIVE Sensitive     * ABUNDANT STAPHYLOCOCCUS AUREUS      Radiology Studies: DG Abd Portable 1V  Result Date: 06/17/2021 CLINICAL DATA:  Feeding tube placement. EXAM: PORTABLE ABDOMEN - 1 VIEW COMPARISON:  None. FINDINGS: Tip of the weighted enteric tube in the right upper quadrant in the region of the distal stomach. There is air within nondilated bowel loops in the upper  abdomen. No obvious free air. IMPRESSION: Tip of the weighted enteric tube in the right upper quadrant in the region of the distal stomach. Electronically Signed   By: Narda Rutherford M.D.   On: 06/17/2021 15:01   ECHOCARDIOGRAM COMPLETE  Result Date: 06/18/2021    ECHOCARDIOGRAM REPORT   Patient Name:   LELLA MULLANY Date of Exam: 06/18/2021 Medical Rec #:  [id number]     Height:       66.0 in Accession #:    [id number]    Weight:       188.0 lb Date of Birth:  07-08-1946      BSA:          1.948 m Patient Age:    75 years      BP:           133/70 mmHg Patient Gender: F             HR:           98 bpm. Exam Location:  Inpatient Procedure: 2D Echo, Cardiac Doppler and Color Doppler Indications:    Epidural abscess [[id number]]  History:        Patient has no prior history of Echocardiogram examinations.  Sonographer:    Renella Cunas RDCS Referring Phys: [id number] [facility] IMPRESSIONS  1. Left ventricular ejection fraction, by estimation, is 65 to 70%. The left ventricle has normal function. The left ventricle has no regional wall motion abnormalities. Left ventricular diastolic parameters are consistent with Grade I diastolic dysfunction (impaired relaxation).  2. Right ventricular systolic function is normal. The right ventricular size is normal. There is normal pulmonary artery systolic pressure.  3. The mitral valve is normal in structure. Mild mitral valve regurgitation. No evidence of mitral stenosis.  4. The aortic valve is normal in structure. Aortic valve regurgitation is mild. No aortic stenosis is present.  5. The inferior vena cava is normal in size with greater than 50% respiratory variability,  suggesting right atrial pressure of 3 mmHg. FINDINGS  Left Ventricle: Left ventricular ejection fraction, by estimation, is 65 to 70%. The left ventricle has normal function. The left ventricle has no regional wall motion abnormalities. The left ventricular internal cavity size was normal in size. There is  no  left ventricular hypertrophy. Left ventricular diastolic parameters are consistent with Grade I diastolic dysfunction (impaired relaxation). Right Ventricle: The right ventricular size is normal. No increase in right ventricular wall thickness. Right ventricular systolic function is normal. There is normal pulmonary artery systolic pressure. The tricuspid regurgitant velocity is 2.44 m/s, and  with an assumed right atrial pressure of 3 mmHg, the estimated right ventricular systolic pressure is 26.8 mmHg. Left Atrium: Left atrial size was normal in size. Right Atrium: Right atrial size was normal in size. Pericardium: There is no evidence of pericardial effusion. Mitral Valve: The mitral valve is normal in structure. Mild mitral valve regurgitation, with posteriorly-directed jet. No evidence of mitral valve stenosis. Tricuspid Valve: The tricuspid valve is normal in structure. Tricuspid valve regurgitation is mild . No evidence of tricuspid stenosis. Aortic Valve: The aortic valve is normal in structure. Aortic valve regurgitation is mild. No aortic stenosis is present. Pulmonic Valve: The pulmonic valve was normal in structure. Pulmonic valve regurgitation is not visualized. No evidence of pulmonic stenosis. Aorta: The aortic root is normal in size and structure. Venous: The inferior vena cava is normal in size with greater than 50% respiratory variability, suggesting right atrial pressure of 3 mmHg. IAS/Shunts: No atrial level shunt detected by color flow Doppler.  LEFT VENTRICLE PLAX 2D LVIDd:         4.60 cm     Diastology LVIDs:         3.50 cm     LV e' medial:    6.96 cm/s LV PW:         1.10 cm     LV E/e' medial:  10.2 LV IVS:        1.10 cm     LV e' lateral:   7.62 cm/s LVOT diam:     2.20 cm     LV E/e' lateral: 9.4 LV SV:         92 LV SV Index:   47 LVOT Area:     3.80 cm  LV Volumes (MOD) LV vol d, MOD A2C: 79.4 ml LV vol d, MOD A4C: 83.8 ml LV vol s, MOD A2C: 32.1 ml LV vol s, MOD A4C: 33.3 ml LV SV  MOD A2C:     47.3 ml LV SV MOD A4C:     83.8 ml LV SV MOD BP:      51.4 ml RIGHT VENTRICLE RV S prime:     17.40 cm/s TAPSE (M-mode): 2.2 cm LEFT ATRIUM           Index       RIGHT ATRIUM           Index LA diam:      3.30 cm 1.69 cm/m  RA Area:     13.30 cm LA Vol (A2C): 37.9 ml 19.46 ml/m RA Volume:   34.40 ml  17.66 ml/m LA Vol (A4C): 24.6 ml 12.63 ml/m  AORTIC VALVE LVOT Vmax:   131.00 cm/s LVOT Vmean:  81.400 cm/s LVOT VTI:    0.243 m  AORTA Ao Root diam: 3.30 cm MITRAL VALVE                TRICUSPID VALVE  MV Area (PHT): 2.67 cm     TR Peak grad:   23.8 mmHg MV Decel Time: 284 msec     TR Vmax:        244.00 cm/s MR Peak grad: 82.4 mmHg MR Vmax:      454.00 cm/s   SHUNTS MV E velocity: 71.30 cm/s   Systemic VTI:  0.24 m MV A velocity: 102.00 cm/s  Systemic Diam: 2.20 cm MV E/A ratio:  0.70 Donato Schultz MD Electronically signed by Donato Schultz MD Signature Date/Time: 06/18/2021/1:08:50 PM    Final     Scheduled Meds:  allopurinol  300 mg Per Tube Daily   amLODipine  10 mg Per Tube Daily   aspirin  81 mg Per Tube Daily   Chlorhexidine Gluconate Cloth  6 each Topical Daily   feeding supplement (OSMOLITE 1.2 CAL)  1,000 mL Per Tube Q24H   feeding supplement (PROSource TF)  45 mL Per Tube BID   folic acid  1 mg Per Tube Daily   free water  115 mL Per Tube Q4H   multivitamin with minerals  1 tablet Per Tube Daily   mupirocin ointment   Nasal BID   [START ON 06/22/2021] thiamine injection  100 mg Intravenous Daily   Continuous Infusions:  nafcillin (NAFCIL) continuous infusion 12 g (06/18/21 1552)   thiamine injection Stopped (06/18/21 1210)     LOS: 8 days   Time spent: 35 minutes.  Azucena Fallen, DO [facility] Pager: Secure chat  06/19/2021, 7:36 AM

## 2021-06-20 LAB — BASIC METABOLIC PANEL
Anion gap: 7 (ref 5–15)
BUN: 109 mg/dL — ABNORMAL HIGH (ref 8–23)
BUN: 104 mg/dL — ABNORMAL HIGH (ref 8–23)
BUN: 100 mg/dL — ABNORMAL HIGH (ref 8–23)
BUN: 97 mg/dL — ABNORMAL HIGH (ref 8–23)
CO2: 22 mmol/L (ref 22–32)
CO2: 23 mmol/L (ref 22–32)
CO2: 24 mmol/L (ref 22–32)
CO2: 23 mmol/L (ref 22–32)
Calcium: 8.4 mg/dL — ABNORMAL LOW (ref 8.9–10.3)
Calcium: 8.2 mg/dL — ABNORMAL LOW (ref 8.9–10.3)
Calcium: 8.1 mg/dL — ABNORMAL LOW (ref 8.9–10.3)
Calcium: 8.1 mg/dL — ABNORMAL LOW (ref 8.9–10.3)
Chloride: >130 mmol/L (ref 98–111)
Chloride: >130 mmol/L (ref 98–111)
Chloride: >130 mmol/L (ref 98–111)
Chloride: 129 mmol/L — ABNORMAL HIGH (ref 98–111)
Creatinine, Ser: 2.27 mg/dL — ABNORMAL HIGH (ref 0.44–1.00)
Creatinine, Ser: 2.13 mg/dL — ABNORMAL HIGH (ref 0.44–1.00)
Creatinine, Ser: 2.09 mg/dL — ABNORMAL HIGH (ref 0.44–1.00)
Creatinine, Ser: 2.01 mg/dL — ABNORMAL HIGH (ref 0.44–1.00)
GFR, Estimated: 22 mL/min — ABNORMAL LOW (ref >60)
GFR, Estimated: 24 mL/min — ABNORMAL LOW (ref >60)
GFR, Estimated: 24 mL/min — ABNORMAL LOW (ref >60)
GFR, Estimated: 25 mL/min — ABNORMAL LOW (ref >60)
Glucose, Bld: 166 mg/dL — ABNORMAL HIGH (ref 70–99)
Glucose, Bld: 196 mg/dL — ABNORMAL HIGH (ref 70–99)
Glucose, Bld: 186 mg/dL — ABNORMAL HIGH (ref 70–99)
Glucose, Bld: 152 mg/dL — ABNORMAL HIGH (ref 70–99)
Potassium: 3.6 mmol/L (ref 3.5–5.1)
Potassium: 3.5 mmol/L (ref 3.5–5.1)
Potassium: 3.3 mmol/L — ABNORMAL LOW (ref 3.5–5.1)
Potassium: 3.3 mmol/L — ABNORMAL LOW (ref 3.5–5.1)
Sodium: 167 mmol/L (ref 135–145)
Sodium: 163 mmol/L (ref 135–145)
Sodium: 162 mmol/L (ref 135–145)
Sodium: 159 mmol/L — ABNORMAL HIGH (ref 135–145)

## 2021-06-20 LAB — CBC
HCT: 33.1 % — ABNORMAL LOW (ref 36.0–46.0)
Hemoglobin: 10.9 g/dL — ABNORMAL LOW (ref 12.0–15.0)
MCH: 33.1 pg (ref 26.0–34.0)
MCHC: 32.9 g/dL (ref 30.0–36.0)
MCV: 100.6 fL — ABNORMAL HIGH (ref 80.0–100.0)
Platelets: 110 10*3/uL — ABNORMAL LOW (ref 150–400)
RBC: 3.29 MIL/uL — ABNORMAL LOW (ref 3.87–5.11)
RDW: 15.3 % (ref 11.5–15.5)
WBC: 15.9 10*3/uL — ABNORMAL HIGH (ref 4.0–10.5)
nRBC: 0.3 % — ABNORMAL HIGH (ref 0.0–0.2)

## 2021-06-20 LAB — GLUCOSE, CAPILLARY
Glucose-Capillary: 123 mg/dL — ABNORMAL HIGH (ref 70–99)
Glucose-Capillary: 132 mg/dL — ABNORMAL HIGH (ref 70–99)
Glucose-Capillary: 139 mg/dL — ABNORMAL HIGH (ref 70–99)
Glucose-Capillary: 161 mg/dL — ABNORMAL HIGH (ref 70–99)
Glucose-Capillary: 165 mg/dL — ABNORMAL HIGH (ref 70–99)
Glucose-Capillary: 184 mg/dL — ABNORMAL HIGH (ref 70–99)

## 2021-06-20 MED ORDER — NAFCILLIN SODIUM 2 G IJ SOLR
INTRAVENOUS | Status: AC
  Filled 2021-06-20 (×13): qty 500

## 2021-06-20 MED ORDER — DEXTROSE 5 % IV BOLUS
1000.0000 mL | Freq: Once | INTRAVENOUS | Status: AC
  Administered 2021-06-20: 1000 mL via INTRAVENOUS

## 2021-06-20 NOTE — Progress Notes (Signed)
Physical Therapy Treatment Patient Details Name: Kristy [name] MRN: [id number] DOB: [birth year] Today's Date: 06/20/2021    History of Present Illness Pt is a 75yo female presenting to [facility] ED on 8/1 after a fall on 7/29. Pt also fell on 8/2 while in the ED. Pt found to be hypokalemic upon admission. 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 Pt is now s/p drainage of abscess and C5-6 ACDF on 06/16/21. Also noted R SI septic arthritis.  PMH: HTN, polycthemia, alcohol use.    PT Comments    Pt with very low tolerance for activity due to pain.  She was lethargic and kept eyes closed throughout session , but moaned with any movement.  Educated pt's granddaughter and daughter  (on phone) regarding therapy's role and gradually trying to increase activity to tolerance.  Session focused on ROM to prevent contractures and repositioning to more neutral neck position and chair positioning.  Pt would benefit from Prevelon boots to decrease risk of pressure ulcers and from chair positioning with staff - discussed with RN.     Follow Up Recommendations  SNF;Supervision/Assistance - 24 hour     Equipment Recommendations  Other (comment) (further assessment post acute)    Recommendations for Other Services       Precautions / Restrictions Precautions Precautions: Fall;Cervical;Other (comment) Precaution Booklet Issued: No Precaution Comments: pain    Mobility  Bed Mobility               General bed mobility comments: Pt unable to tolerate any movement without moaning.  Positioned in chair position in bed. Also, noted pt hyperextending neck.  Positioned carefully with pillow under head for more neutral neck position    Transfers                    Ambulation/Gait                 Stairs             Wheelchair Mobility    Modified Rankin (Stroke Patients Only)       Balance                                             Cognition Arousal/Alertness: Lethargic Behavior During Therapy: WFL for tasks assessed/performed Overall Cognitive Status: Difficult to assess Area of Impairment: Attention;Following commands;Safety/judgement;Awareness;Memory;Orientation;Problem solving                 Orientation Level: Disoriented to;Person;Place;Time;Situation Current Attention Level: Focused       Awareness: Intellectual   General Comments: Pt lethargic and only occasionally opening eyes.  Pt not able to follow commands.  Did state "bye baby" at end of session.  Pt with frequent moaning with any slight movment but when still resting comfortably      Exercises General Exercises - Lower Extremity Ankle Circles/Pumps: PROM;Both;10 reps;Supine Heel Slides: PROM;10 reps;Both;Supine (Pt with slight assistance on R for 1-2 reps)    General Comments        Pertinent Vitals/Pain Pain Assessment: Faces Faces Pain Scale: Hurts whole lot Pain Location: generalized with any movement; at rest pt falls back asleep Pain Descriptors / Indicators: Grimacing;Moaning Pain Intervention(s): Limited activity within patient's tolerance;Monitored during session;Premedicated before session    Home Living  Prior Function            PT Goals (current goals can now be found in the care plan section) Progress towards PT goals: Not progressing toward goals - comment (pain limiting)    Frequency    Min 3X/week      PT Plan Current plan remains appropriate    Co-evaluation PT/OT/SLP Co-Evaluation/Treatment: Yes Reason for Co-Treatment: Other (comment);For patient/therapist safety (pain control) PT goals addressed during session: Mobility/safety with mobility;Strengthening/ROM OT goals addressed during session: ADL's and self-care;Strengthening/ROM      AM-PAC PT "6 Clicks" Mobility   Outcome Measure  Help needed turning from  your back to your side while in a flat bed without using bedrails?: Total Help needed moving from lying on your back to sitting on the side of a flat bed without using bedrails?: Total Help needed moving to and from a bed to a chair (including a wheelchair)?: Total Help needed standing up from a chair using your arms (e.g., wheelchair or bedside chair)?: Total Help needed to walk in hospital room?: Total Help needed climbing 3-5 steps with a railing? : Total 6 Click Score: 6    End of Session   Activity Tolerance: Patient limited by pain Patient left: in bed;with call bell/phone within reach;with bed alarm set;with family/visitor present Nurse Communication: Mobility status PT Visit Diagnosis: History of falling (Z91.81);Difficulty in walking, not elsewhere classified (R26.2);Muscle weakness (generalized) (M62.81);Other abnormalities of gait and mobility (R26.89)     Time: 8099-8338 PT Time Calculation (min) (ACUTE ONLY): 30 min  Charges:  $Therapeutic Exercise: 8-22 mins                     Anise Salvo, PT Acute Rehab Services Pager [phone] [facility] [phone]    Rayetta Humphrey 06/20/2021, 2:22 PM

## 2021-06-20 NOTE — Progress Notes (Addendum)
PROGRESS NOTE  Kristy NayLinda T Gendron  ZOX:[id number] DOB: 05/30/1946 DOA: 06/10/2021 PCP: Darrow BussingKoirala, Dibas, MD   Brief Narrative: Kristy [name] is a 75 y.o. female with a history of alcohol use who presented to the ED for frequent falls, generalized weakness, and confusion. She was found to be afebrile, hypertensive, and without hypoxia. Also had hypokalemia (K 2.6), SCr 1.88 (above baseline ~1.5), LFT elevation (AST 576, ALT 305). Ammonia normal. EtOH, salicylate, tylenol levels negative. CT head and cervical spine were reassuring, though the patient was unable to rise to seated position from laying prompting consultation for admission. The patient was given potassium supplementation, started on CIWA protocol, and admitted with note made of continued AMS. She has remained weak and confused, though on 8/5 became less interactive, retaining urine, WBC noted to be rising. Blood and urine cultures ordered, ceftriaxone given with bacteriuria and urinary retention. MR brain has not been done yet. Neurology consulted with worsening mental status and nuchal rigidity. LP under fluoro requested, empirically treating meningitis/encephalitis.   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); Cervical discitis/osteomyelitis; UTI (POA); cannot rule out concurrent meninigitis - Neuro, ID, and Neurosurgery consulted and following - Appreciate insight and recommendations -TTE pending given gram-positive staph notable in both urine culture and on surgical culture of the neck, ID concerned for possible systemic or central etiology -S/P I/D of  06/16/21 - noted copious amounts of pus in the longus colli with myositis, discitis, osteomyelitis, large epidural abscess s/p drainage -cultures consistent with gram-positive staph, speciation  pending -Continue nafcillin per infectious disease, previously discontinued all other antibiotics  Acute metabolic encephalopathy: Multifactorial, likely also a chronic component; resolving.  - Neurology consulted - bedside LP unable to be performed - cultures to be obtained as above with abscess drainage - MRI remarkable for osteo/epidural abscess as above - Empiric coverage with vancomycin, ampicillin, ceftriaxone 2g, and acyclovir.  - Patient's mental status appears to be improving slowly, certainly not back to baseline but more calm today somewhat more interactive but still limited -Patient remains n.p.o. with core track for nutrition and medication administration, will reconsult speech evaluation once patient's mental status improves and she can follow commands  UTI, POA, resolved:  - Covered by above abx - treatment completed  Ambulatory dysfunction with fall at home, severe weakness:  - Secondary to above - PT/OT currently recommending SNF rehabilitation once acute infectious process under better control -    Alcoholic hepatitis:  - Based on AST predominance of LFT elevations. Hepatitis panel nonreactive.  - No indication for prednisolone at this time. Continue trending. - AST/ALT downtrending - bilirubin increased marginally    Alcohol abuse:  - Per family - 2 beers per day of late, etoh level negative in ED.  - Continue CIWA. Monitor closely for withdrawal. May DC in AM if no need for ativan. - Cessation counseling to be repeated once pt more clear-headed. - Empirically supplementing thiamine, folate, MVM.   Hypokalemia: - Supplemented with resolution   Hypovolemic/hyponatremia, resolved -currently hypernatremic/hyperchloremic -Likely secondary to previous IV fluid boluses and concurrent IV medications - Worsening overnight but now downtrending this morning with increased free water flushes and D5W - Follow closely  Severe protein caloric malnutrition  -Continue tube  feeds and free water flushes via core track NG tube - Albumin  continues to downtrend despite increased tube feeds  HTN: - Continue ARB, and increased norvasc.    AKI on stage IIIb CKD:  -Labile in the setting of poor p.o. intake, IV fluids and initiation of free water flushes-we will titrate accordingly - Monitor renal function closely with initiation of vancomycin, acyclovir.  HLD: - Hold statin for now.  DVT prophylaxis: Lovenox 30mg  Code Status: Full Family Communication: None at bedside Disposition Plan:  Status is: Inpatient  Remains inpatient appropriate because:Persistent severe electrolyte disturbances, Altered mental status, and Unsafe d/c plan  Dispo: The patient is from: Home              Anticipated d/c is to: SNF              Patient currently is not medically stable to d/c.   Difficult to place patient No  Consults: Neurology Psychiatry  Procedures:  None  Antimicrobials: Ceftriaxone, Vancomycin  Subjective: No acute issues or events overnight, patient's sodium continues to trend upwards as above, being treated more aggressively today with downturn already.  Review of systems remains remarkably limited given patient's mental status as above  Objective: Vitals:   06/19/21 1912 06/19/21 2300 06/20/21 0500 06/20/21 0725  BP: 122/72 120/67  119/61  Pulse: (!) 101 100  90  Resp: 20 20  20   Temp: 98.2 F (36.8 C) 98.2 F (36.8 C)  98.5 F (36.9 C)  TempSrc: Axillary Axillary  Oral  SpO2: 99% 99%  99%  Weight:   76.7 kg   Height:        Intake/Output Summary (Last 24 hours) at 06/20/2021 0745 Last data filed at 06/19/2021 1756 Gross per 24 hour  Intake --  Output 700 ml  Net -700 ml    Gen: 75 y.o. female in no distress resting comfortably today, shakes her head yes no in regards to simple questions but quite somnolent and poorly interactive Pulm: Nonlabored breathing room air. Clear. CV: Regular tachycardia without murmur, or gallop. No JVD or  edema. GI: Abdominal exam limited due to pt's withdrawal and yelling with any palpation of any part of her body. She does continue to guard diffusely however.  Ext: Warm, no deformities Skin: Anterior neck postoperative surgical site clean dry intact without erythema or purulence Neuro: Asleep but awakes with verbal/tactile stimuli, noncoherent/garbled speech - unable to follow simple commands Psych: Unable to assess  Data Reviewed: I have personally reviewed following labs and imaging studies  CBC: Recent Labs  Lab 06/16/21 0439 06/17/21 0417 06/18/21 0343 06/19/21 0412 06/20/21 0404  WBC 21.0* 15.2* 13.3* 14.3* 15.9*  HGB 11.8* 12.2 12.3 11.8* 10.9*  HCT 35.6* 36.9 37.0 35.7* 33.1*  MCV 95.7 96.6 95.9 99.2 100.6*  PLT 176 169 140* 129* 110*    Basic Metabolic Panel: Recent Labs  Lab 06/16/21 0439 06/17/21 0417 06/18/21 0343 06/19/21 0412 06/20/21 0404  NA 154* 158* 157* 164* 167*  K 3.2* 2.8* 3.9 4.2 3.6  CL 120* 124* 123* 130* >130*  CO2 19* 21* 17* 20* 22  GLUCOSE 86 84 119* 139* 166*  BUN 80* 79* 102* 114* 109*  CREATININE 2.44* 2.19* 2.74* 2.53* 2.27*  CALCIUM 9.1 9.0 8.7* 8.6* 8.4*    GFR: Estimated Creatinine Clearance: 22.4 mL/min (A) (by C-G formula based on SCr of 2.27 mg/dL (H)). Liver Function Tests: Recent Labs  Lab 06/14/21 2013 06/19/21 0412  AST 120* 168*  ALT 136* 150*  ALKPHOS 104 133*  BILITOT 6.5* 3.0*  PROT 6.9 5.7*  ALBUMIN 1.6* 1.2*    No results for input(s): LIPASE, AMYLASE in the last 168 hours. No results for input(s): AMMONIA in the last 168 hours.  Coagulation Profile: No results for input(s): INR, PROTIME in the last 168 hours.  Cardiac Enzymes: No results for input(s): CKTOTAL, CKMB, CKMBINDEX, TROPONINI in the last 168 hours.  BNP (last 3 results) No results for input(s): PROBNP in the last 8760 hours. HbA1C: No results for input(s): HGBA1C in the last 72 hours. CBG: Recent Labs  Lab 06/19/21 0748 06/19/21 1205  06/19/21 1641 06/20/21 0433 06/20/21 0727  GLUCAP 125* 114* 169* 139* 165*    Lipid Profile: No results for input(s): CHOL, HDL, LDLCALC, TRIG, CHOLHDL, LDLDIRECT in the last 72 hours. Thyroid Function Tests: No results for input(s): TSH, T4TOTAL, FREET4, T3FREE, THYROIDAB in the last 72 hours.  Anemia Panel: No results for input(s): VITAMINB12, FOLATE, FERRITIN, TIBC, IRON, RETICCTPCT in the last 72 hours.  Urine analysis:    Component Value Date/Time   COLORURINE AMBER (A) 06/14/2021 1338   APPEARANCEUR HAZY (A) 06/14/2021 1338   LABSPEC 1.011 06/14/2021 1338   PHURINE 6.0 06/14/2021 1338   GLUCOSEU NEGATIVE 06/14/2021 1338   HGBUR LARGE (A) 06/14/2021 1338   BILIRUBINUR NEGATIVE 06/14/2021 1338   KETONESUR NEGATIVE 06/14/2021 1338   PROTEINUR NEGATIVE 06/14/2021 1338   UROBILINOGEN 0.2 05/14/2008 0020   NITRITE NEGATIVE 06/14/2021 1338   LEUKOCYTESUR NEGATIVE 06/14/2021 1338   Recent Results (from the past 240 hour(s))  Resp Panel by RT-PCR (Flu A&B, Covid) Nasopharyngeal Swab     Status: None   Collection Time: 06/11/21  3:13 AM   Specimen: Nasopharyngeal Swab; Nasopharyngeal(NP) swabs in vial transport medium  Result Value Ref Range Status   SARS Coronavirus 2 by RT PCR NEGATIVE NEGATIVE Final    Comment: (NOTE) SARS-CoV-2 target nucleic acids are NOT DETECTED.  The SARS-CoV-2 RNA is generally detectable in upper respiratory specimens during the acute phase of infection. The lowest concentration of SARS-CoV-2 viral copies this assay can detect is 138 copies/mL. A negative result does not preclude SARS-Cov-2 infection and should not be used as the sole basis for treatment or other patient management decisions. A negative result may occur with  improper specimen collection/handling, submission of specimen other than nasopharyngeal swab, presence of viral mutation(s) within the areas targeted by this assay, and inadequate number of viral copies(<138 copies/mL). A  negative result must be combined with clinical observations, patient history, and epidemiological information. The expected result is Negative.  Fact Sheet for Patients:  BloggerCourse.com  Fact Sheet for Healthcare Providers:  SeriousBroker.it  This test is no t yet approved or cleared by the [place] FDA and  has been authorized for detection and/or diagnosis of SARS-CoV-2 by FDA under an Emergency Use Authorization (EUA). This EUA will remain  in effect (meaning this test can be used) for the duration of the COVID-19 declaration under Section 564(b)(1) of the Act, 21 U.S.C.section 360bbb-3(b)(1), unless the authorization is terminated  or revoked sooner.       Influenza A by PCR NEGATIVE NEGATIVE Final   Influenza B by PCR NEGATIVE NEGATIVE Final    Comment: (NOTE) The Xpert Xpress SARS-CoV-2/FLU/RSV plus assay is intended as an aid in the diagnosis of influenza from Nasopharyngeal swab specimens and should not be used as a sole basis for treatment. Nasal washings and aspirates are unacceptable for Xpert Xpress SARS-CoV-2/FLU/RSV testing.  Fact Sheet for Patients: BloggerCourse.com  Fact Sheet for Healthcare Providers: SeriousBroker.it  This test  is not yet approved or cleared by the [place] and has been authorized for detection and/or diagnosis of SARS-CoV-2 by FDA under an Emergency Use Authorization (EUA). This EUA will remain in effect (meaning this test can be used) for the duration of the COVID-19 declaration under Section 564(b)(1) of the Act, 21 U.S.C. section 360bbb-3(b)(1), unless the authorization is terminated or revoked.  Performed at [facility], [address], [place], [place] [address]   Urine Culture     Status: Abnormal   Collection Time: 06/14/21  1:28 PM   Specimen: Urine, Clean Catch  Result Value Ref Range Status   Specimen  Description URINE, CLEAN CATCH  Final   Special Requests   Final    NONE Performed at [facility], [address], [place], [place] [address]    Culture >=100,000 COLONIES/mL STAPHYLOCOCCUS AUREUS (A)  Final   Report Status 06/16/2021 FINAL  Final   Organism ID, Bacteria STAPHYLOCOCCUS AUREUS (A)  Final      Susceptibility   Staphylococcus aureus - MIC*    CIPROFLOXACIN <=0.5 SENSITIVE Sensitive     GENTAMICIN <=0.5 SENSITIVE Sensitive     NITROFURANTOIN <=16 SENSITIVE Sensitive     OXACILLIN 0.5 SENSITIVE Sensitive     TETRACYCLINE <=1 SENSITIVE Sensitive     VANCOMYCIN <=0.5 SENSITIVE Sensitive     TRIMETH/SULFA <=10 SENSITIVE Sensitive     CLINDAMYCIN <=0.25 SENSITIVE Sensitive     RIFAMPIN <=0.5 SENSITIVE Sensitive     Inducible Clindamycin NEGATIVE Sensitive     * >=100,000 COLONIES/mL STAPHYLOCOCCUS AUREUS  Culture, blood (routine x 2)     Status: None   Collection Time: 06/14/21  7:40 PM   Specimen: BLOOD  Result Value Ref Range Status   Specimen Description BLOOD RIGHT ANTECUBITAL  Final   Special Requests AEROBIC BOTTLE ONLY Blood Culture adequate volume  Final   Culture   Final    NO GROWTH 5 DAYS Performed at [facility], [address], [place], [place] [address]    Report Status 06/19/2021 FINAL  Final  Culture, blood (routine x 2)     Status: None   Collection Time: 06/14/21  8:13 PM   Specimen: BLOOD RIGHT HAND  Result Value Ref Range Status   Specimen Description BLOOD RIGHT HAND  Final   Special Requests   Final    AEROBIC BOTTLE ONLY Blood Culture results may not be optimal due to an inadequate volume of blood received in culture bottles   Culture   Final    NO GROWTH 5 DAYS Performed at [facility], [address], [place], [place] [address]    Report Status 06/19/2021 FINAL  Final  Surgical pcr screen     Status: Abnormal   Collection Time: 06/16/21 11:08 AM   Specimen: Nasal Mucosa; Nasal Swab  Result Value Ref Range Status    MRSA, PCR NEGATIVE NEGATIVE Final   Staphylococcus aureus POSITIVE (A) NEGATIVE Final    Comment: (NOTE) The Xpert SA Assay (FDA approved for NASAL specimens in patients 41 years of age and older), is one component of a comprehensive surveillance program. It is not intended to diagnose infection nor to guide or monitor treatment. Performed at [facility], [address], [place], [place] [address]   Aerobic/Anaerobic Culture w Gram Stain (surgical/deep wound)     Status: None (Preliminary result)   Collection Time: 06/16/21 12:42 PM   Specimen: Abscess  Result Value Ref  Range Status   Specimen Description ABSCESS  Final   Special Requests EPIDURAL ABSCESS  Final   Gram Stain   Final    FEW WBC PRESENT,BOTH PMN AND MONONUCLEAR FEW GRAM POSITIVE COCCI Performed at [facility], [address], [place], [place] [address]    Culture   Final    ABUNDANT STAPHYLOCOCCUS AUREUS NO ANAEROBES ISOLATED; CULTURE IN PROGRESS FOR 5 DAYS    Report Status PENDING  Incomplete   Organism ID, Bacteria STAPHYLOCOCCUS AUREUS  Final      Susceptibility   Staphylococcus aureus - MIC*    CIPROFLOXACIN <=0.5 SENSITIVE Sensitive     ERYTHROMYCIN <=0.25 SENSITIVE Sensitive     GENTAMICIN <=0.5 SENSITIVE Sensitive     OXACILLIN 0.5 SENSITIVE Sensitive     TETRACYCLINE <=1 SENSITIVE Sensitive     VANCOMYCIN 1 SENSITIVE Sensitive     TRIMETH/SULFA <=10 SENSITIVE Sensitive     CLINDAMYCIN <=0.25 SENSITIVE Sensitive     RIFAMPIN <=0.5 SENSITIVE Sensitive     Inducible Clindamycin NEGATIVE Sensitive     * ABUNDANT STAPHYLOCOCCUS AUREUS      Radiology Studies: ECHOCARDIOGRAM COMPLETE  Result Date: 06/18/2021    ECHOCARDIOGRAM REPORT   Patient Name:   DANARIA LARSEN Date of Exam: 06/18/2021 Medical Rec #:  [id number]     Height:       66.0 in Accession #:    [id number]    Weight:       188.0 lb Date of Birth:  Mar 13, 1946      BSA:          1.948 m Patient Age:    75 years      BP:           133/70  mmHg Patient Gender: F             HR:           98 bpm. Exam Location:  Inpatient Procedure: 2D Echo, Cardiac Doppler and Color Doppler Indications:    Epidural abscess [[id number]]  History:        Patient has no prior history of Echocardiogram examinations.  Sonographer:    Renella Cunas RDCS Referring Phys: [id number] [facility] IMPRESSIONS  1. Left ventricular ejection fraction, by estimation, is 65 to 70%. The left ventricle has normal function. The left ventricle has no regional wall motion abnormalities. Left ventricular diastolic parameters are consistent with Grade I diastolic dysfunction (impaired relaxation).  2. Right ventricular systolic function is normal. The right ventricular size is normal. There is normal pulmonary artery systolic pressure.  3. The mitral valve is normal in structure. Mild mitral valve regurgitation. No evidence of mitral stenosis.  4. The aortic valve is normal in structure. Aortic valve regurgitation is mild. No aortic stenosis is present.  5. The inferior vena cava is normal in size with greater than 50% respiratory variability, suggesting right atrial pressure of 3 mmHg. FINDINGS  Left Ventricle: Left ventricular ejection fraction, by estimation, is 65 to 70%. The left ventricle has normal function. The left ventricle has no regional wall motion abnormalities. The left ventricular internal cavity size was normal in size. There is  no left ventricular hypertrophy. Left ventricular diastolic parameters are consistent with Grade I diastolic dysfunction (impaired relaxation). Right Ventricle: The right ventricular size is normal. No increase in right ventricular wall thickness. Right ventricular systolic function is normal. There is normal pulmonary artery systolic pressure. The tricuspid regurgitant velocity is 2.44 m/s, and  with an  assumed right atrial pressure of 3 mmHg, the estimated right ventricular systolic pressure is 26.8 mmHg. Left Atrium: Left atrial size was normal in  size. Right Atrium: Right atrial size was normal in size. Pericardium: There is no evidence of pericardial effusion. Mitral Valve: The mitral valve is normal in structure. Mild mitral valve regurgitation, with posteriorly-directed jet. No evidence of mitral valve stenosis. Tricuspid Valve: The tricuspid valve is normal in structure. Tricuspid valve regurgitation is mild . No evidence of tricuspid stenosis. Aortic Valve: The aortic valve is normal in structure. Aortic valve regurgitation is mild. No aortic stenosis is present. Pulmonic Valve: The pulmonic valve was normal in structure. Pulmonic valve regurgitation is not visualized. No evidence of pulmonic stenosis. Aorta: The aortic root is normal in size and structure. Venous: The inferior vena cava is normal in size with greater than 50% respiratory variability, suggesting right atrial pressure of 3 mmHg. IAS/Shunts: No atrial level shunt detected by color flow Doppler.  LEFT VENTRICLE PLAX 2D LVIDd:         4.60 cm     Diastology LVIDs:         3.50 cm     LV e' medial:    6.96 cm/s LV PW:         1.10 cm     LV E/e' medial:  10.2 LV IVS:        1.10 cm     LV e' lateral:   7.62 cm/s LVOT diam:     2.20 cm     LV E/e' lateral: 9.4 LV SV:         92 LV SV Index:   47 LVOT Area:     3.80 cm  LV Volumes (MOD) LV vol d, MOD A2C: 79.4 ml LV vol d, MOD A4C: 83.8 ml LV vol s, MOD A2C: 32.1 ml LV vol s, MOD A4C: 33.3 ml LV SV MOD A2C:     47.3 ml LV SV MOD A4C:     83.8 ml LV SV MOD BP:      51.4 ml RIGHT VENTRICLE RV S prime:     17.40 cm/s TAPSE (M-mode): 2.2 cm LEFT ATRIUM           Index       RIGHT ATRIUM           Index LA diam:      3.30 cm 1.69 cm/m  RA Area:     13.30 cm LA Vol (A2C): 37.9 ml 19.46 ml/m RA Volume:   34.40 ml  17.66 ml/m LA Vol (A4C): 24.6 ml 12.63 ml/m  AORTIC VALVE LVOT Vmax:   131.00 cm/s LVOT Vmean:  81.400 cm/s LVOT VTI:    0.243 m  AORTA Ao Root diam: 3.30 cm MITRAL VALVE                TRICUSPID VALVE MV Area (PHT): 2.67 cm     TR  Peak grad:   23.8 mmHg MV Decel Time: 284 msec     TR Vmax:        244.00 cm/s MR Peak grad: 82.4 mmHg MR Vmax:      454.00 cm/s   SHUNTS MV E velocity: 71.30 cm/s   Systemic VTI:  0.24 m MV A velocity: 102.00 cm/s  Systemic Diam: 2.20 cm MV E/A ratio:  0.70 Donato Schultz MD Electronically signed by Donato Schultz MD Signature Date/Time: 06/18/2021/1:08:50 PM    Final    VAS [place] UPPER EXTREMITY VENOUS  DUPLEX  Result Date: 06/19/2021 UPPER VENOUS STUDY  Patient Name:  DAYANARA SHERRILL  Date of Exam:   06/19/2021 Medical Rec #: [id number]      Accession #:    [id number] Date of Birth: [birth year]       Patient Gender: F Patient Age:   72 years Exam Location:  [facility] Procedure:      VAS [place] UPPER EXTREMITY VENOUS DUPLEX Referring Phys: Odette Fraction --------------------------------------------------------------------------------  Indications: Pain, and Swelling Risk Factors: IV's in LUE (hand and forearm). Limitations: Poor ultrasound/tissue interface and IV bandage, and patient pain intolerance. Comparison Study: No previous exams Performing Technologist: Jody Hill RVT, RDMS  Examination Guidelines: A complete evaluation includes B-mode imaging, spectral Doppler, color Doppler, and power Doppler as needed of all accessible portions of each vessel. Bilateral testing is considered an integral part of a complete examination. Limited examinations for reoccurring indications may be performed as noted.  Right Findings: +----------+------------+---------+-----------+----------+-------+ RIGHT     CompressiblePhasicitySpontaneousPropertiesSummary +----------+------------+---------+-----------+----------+-------+ Subclavian    Full       Yes       Yes                      +----------+------------+---------+-----------+----------+-------+  Left Findings: +----------+------------+---------+-----------+----------+-------+ LEFT      CompressiblePhasicitySpontaneousPropertiesSummary  +----------+------------+---------+-----------+----------+-------+ IJV           Full       Yes       Yes                      +----------+------------+---------+-----------+----------+-------+ Subclavian    Full       Yes       Yes                      +----------+------------+---------+-----------+----------+-------+ Axillary      Full       Yes       Yes                      +----------+------------+---------+-----------+----------+-------+ Brachial      Full       Yes       Yes                      +----------+------------+---------+-----------+----------+-------+ Radial        Full                                          +----------+------------+---------+-----------+----------+-------+ Ulnar         Full                                          +----------+------------+---------+-----------+----------+-------+ Cephalic      Full                                          +----------+------------+---------+-----------+----------+-------+ Basilic       Full       Yes       Yes                      +----------+------------+---------+-----------+----------+-------+ Limited visualization of ulnar  and radial veins due to IV placement, edema, and patient pain intolerance.  Summary:  Right: No evidence of thrombosis in the subclavian.  Left: No evidence of deep vein thrombosis in the upper extremity. No evidence of superficial vein thrombosis in the upper extremity.  *See table(s) above for measurements and observations.  Diagnosing physician: Coral Else MD Electronically signed by Coral Else MD on 06/19/2021 at 6:54:25 PM.    Final     Scheduled Meds:  allopurinol  300 mg Per Tube Daily   amLODipine  10 mg Per Tube Daily   aspirin  81 mg Per Tube Daily   Chlorhexidine Gluconate Cloth  6 each Topical Daily   feeding supplement (OSMOLITE 1.2 CAL)  1,000 mL Per Tube Q24H   feeding supplement (PROSource TF)  45 mL Per Tube BID   folic acid  1 mg Per  Tube Daily   free water  115 mL Per Tube Q4H   multivitamin with minerals  1 tablet Per Tube Daily   mupirocin ointment   Nasal BID   [START ON 06/22/2021] thiamine injection  100 mg Intravenous Daily   Continuous Infusions:  dextrose     nafcillin (NAFCIL) continuous infusion 12 g (06/18/21 1552)   thiamine injection 250 mg (06/19/21 1017)     LOS: 9 days   Time spent: 35 minutes.  Azucena Fallen, DO [facility] Pager: Secure chat  06/20/2021, 7:45 AM

## 2021-06-20 NOTE — Progress Notes (Signed)
Occupational Therapy Treatment Patient Details Name: Kristy [name] MRN: [id number] DOB: 03-Aug-1946 Today's Date: 06/20/2021    History of present illness Pt is a 75yo female presenting to [facility] ED on 8/1 after a fall on 7/29. Pt also fell on 8/2 while in the ED. Pt found to be hypokalemic upon admission. 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 Pt is now s/p drainage of abscess and C5-6 ACDF on 06/16/21. Also noted R SI septic arthritis.  PMH: HTN, polycthemia, alcohol use.   OT comments  Pt seen as cotreat with PT. Premedicated prior to session to attempt to increase mobility. Pt lethargic with eyes closed 95% of session, inconsistently following 1 step commands with delay. Worked in "chair position" and pt left @ 45 degrees. Pt is now total A for all ADL and mobility and is at high risk for skin breakdown. B Prevalon boots ordered to be worn at all times , removing 1x/shift to check skin integrity and reposition. Spoke with daughter over the phone via FaceTime to update on her mother's participation. Daughter had questions regarding prognosis and likelihood of making a recovery - recommend Palliative Medicine Consult to support family and help manage pain in hopes for pt to progress with rehab.  Encourage nsg to place pt in "chair position" TID, and other measures to reduce delirium.  Will continue to follow acutely.   Follow Up Recommendations  SNF    Equipment Recommendations  3 in 1 bedside commode;Other (comment)    Recommendations for Other Services Other (comment) (Palliative consult)    Precautions / Restrictions Precautions Precautions: Fall;Cervical;Other (comment) (coretrack) Precaution Booklet Issued: No Precaution Comments: pain       Mobility Bed Mobility Overal bed mobility: Needs Assistance             General bed mobility comments: total A; used chair position to attempt to mobilize; [facility]  left @ 50 degrees    Transfers                 General transfer comment: unable  at this time    Balance Overall balance assessment: Needs assistance                                         ADL either performed or assessed with clinical judgement   ADL                                         General ADL Comments: total A with all ADL (hand over hand total a for simple grooming)     Vision   Additional Comments: eyes closed 95% of session   Perception     Praxis      Cognition Arousal/Alertness: Lethargic Behavior During Therapy: Flat affect Overall Cognitive Status: Impaired/Different from baseline Area of Impairment: Orientation;Attention;Memory;Following commands;Safety/judgement;Awareness;Problem solving                 Orientation Level: Disoriented to;Place;Time;Situation Current Attention Level: Focused Memory: Decreased recall of precautions;Decreased short-term memory Following Commands: Follows one step commands inconsistently;Follows one step commands with increased time Safety/Judgement: Decreased awareness of safety;Decreased awareness of deficits Awareness: Intellectual Problem Solving: Slow processing;Decreased initiation;Difficulty sequencing;Requires tactile cues;Requires verbal cues General Comments: Pt lethargic and only occasionally opening eyes.  Pt not able to follow commands.  Did state "bye baby" at end of session.  Pt with frequent moaning with any slight movment but when still resting comfortably        Exercises Exercises: Other exercises General Exercises - Lower Extremity Ankle Circles/Pumps: PROM;Both;10 reps;Supine Heel Slides: PROM;10 reps;Both;Supine (Pt with slight assistance on R for 1-2 reps) Other Exercises Other Exercises: PROM BUE within pain tolerance Other Exercises: head positioned to reduce cervical extension   Shoulder Instructions       General Comments At risk for  developing B knee contractures; B heels are boggy. Recommend B Prevalon boots adn do not place pillows under knees. Pt at risk for skin breakdown. discussed with nursing need ot assess sacrum and possibly order air matress - by with foley however incontinenet of BM    Pertinent Vitals/ Pain       Pain Assessment: Faces Faces Pain Scale: Hurts worst Pain Location: moaning/crying with any movement Pain Descriptors / Indicators: Crying;Grimacing;Moaning Pain Intervention(s): Limited activity within patient's tolerance;Premedicated before session;Repositioned  Home Living                                          Prior Functioning/Environment              Frequency  Min 2X/week        Progress Toward Goals  OT Goals(current goals can now be found in the care plan section)  Progress towards OT goals: Not progressing toward goals - comment (will reassess goals)  Acute Rehab OT Goals Patient Stated Goal: per family for pt to get better OT Goal Formulation: Patient unable to participate in goal setting Time For Goal Achievement: 06/25/21 Potential to Achieve Goals: Fair ADL Goals Pt Will Perform Eating: with set-up;with supervision;with adaptive utensils Pt Will Perform Grooming: with min assist;sitting Pt Will Perform Upper Body Bathing: with min assist;sitting Pt Will Transfer to Toilet: with +2 assist;with mod assist;bedside commode;ambulating Additional ADL Goal #1: Pt will demonstrate emergent awareness during ADL tasks  Plan Discharge plan remains appropriate    Co-evaluation    PT/OT/SLP Co-Evaluation/Treatment: Yes Reason for Co-Treatment: Complexity of the patient's impairments (multi-system involvement);Necessary to address cognition/behavior during functional activity;For patient/therapist safety PT goals addressed during session: Mobility/safety with mobility;Strengthening/ROM OT goals addressed during session: ADL's and self-care      AM-PAC  OT "6 Clicks" Daily Activity     Outcome Measure   Help from another person eating meals?: Total Help from another person taking care of personal grooming?: Total Help from another person toileting, which includes using toliet, bedpan, or urinal?: Total Help from another person bathing (including washing, rinsing, drying)?: Total Help from another person to put on and taking off regular upper body clothing?: Total Help from another person to put on and taking off regular lower body clothing?: Total 6 Click Score: 6    End of Session    OT Visit Diagnosis: Unsteadiness on feet (R26.81);Other abnormalities of gait and mobility (R26.89);Repeated falls (R29.6);Muscle weakness (generalized) (M62.81);Other symptoms and signs involving cognitive function;Pain Pain - Right/Left:  (B; all over) Pain - part of body:  (all over with any movement)   Activity Tolerance Patient limited by fatigue;Patient limited by lethargy;Patient limited by pain   Patient Left in bed;with call bell/phone within reach;with bed alarm set;with family/visitor present (modified chair position)   Nurse Communication Mobility status (will most  likely need air mattress)        Time: 7482-7078 OT Time Calculation (min): 32 min  Charges: OT General Charges $OT Visit: 1 Visit OT Treatments $Self Care/Home Management : 8-22 mins  Luisa Dago, OT/L   Acute OT Clinical Specialist Acute Rehabilitation Services Pager [phone] Office [phone]    [facility] 06/20/2021, 4:21 PM

## 2021-06-20 NOTE — Progress Notes (Signed)
Date and time results received: 06/20/21 1245 (use smartphrase ".now" to insert current time)  Test: Electrolytes Critical Value: Na = 163 and Cl >130  Name of Provider Notified: Dr. Natale Milch  Orders Received? Or Actions Taken?: continue current treatment

## 2021-06-20 NOTE — Progress Notes (Signed)
Critical sodium of 167 and Chloride > 130 called on this patient. MD paged to notify

## 2021-06-21 ENCOUNTER — Inpatient Hospital Stay (HOSPITAL_COMMUNITY): Payer: Medicare Other

## 2021-06-21 LAB — BASIC METABOLIC PANEL
Anion gap: 7 (ref 5–15)
BUN: 100 mg/dL — ABNORMAL HIGH (ref 8–23)
BUN: 94 mg/dL — ABNORMAL HIGH (ref 8–23)
CO2: 22 mmol/L (ref 22–32)
CO2: 21 mmol/L — ABNORMAL LOW (ref 22–32)
Calcium: 8.2 mg/dL — ABNORMAL LOW (ref 8.9–10.3)
Calcium: 7.8 mg/dL — ABNORMAL LOW (ref 8.9–10.3)
Chloride: >130 mmol/L (ref 98–111)
Chloride: 125 mmol/L — ABNORMAL HIGH (ref 98–111)
Creatinine, Ser: 2.01 mg/dL — ABNORMAL HIGH (ref 0.44–1.00)
Creatinine, Ser: 1.9 mg/dL — ABNORMAL HIGH (ref 0.44–1.00)
GFR, Estimated: 25 mL/min — ABNORMAL LOW (ref >60)
GFR, Estimated: 27 mL/min — ABNORMAL LOW (ref >60)
Glucose, Bld: 158 mg/dL — ABNORMAL HIGH (ref 70–99)
Glucose, Bld: 157 mg/dL — ABNORMAL HIGH (ref 70–99)
Potassium: 3.3 mmol/L — ABNORMAL LOW (ref 3.5–5.1)
Potassium: 3.1 mmol/L — ABNORMAL LOW (ref 3.5–5.1)
Sodium: 159 mmol/L — ABNORMAL HIGH (ref 135–145)
Sodium: 153 mmol/L — ABNORMAL HIGH (ref 135–145)

## 2021-06-21 LAB — AEROBIC/ANAEROBIC CULTURE W GRAM STAIN (SURGICAL/DEEP WOUND)

## 2021-06-21 LAB — CBC WITH DIFFERENTIAL/PLATELET
Abs Immature Granulocytes: 0.26 10*3/uL — ABNORMAL HIGH (ref 0.00–0.07)
Basophils Absolute: 0 10*3/uL (ref 0.0–0.1)
Basophils Relative: 0 %
Eosinophils Absolute: 0.2 10*3/uL (ref 0.0–0.5)
Eosinophils Relative: 1 %
HCT: 34.6 % — ABNORMAL LOW (ref 36.0–46.0)
Hemoglobin: 10.8 g/dL — ABNORMAL LOW (ref 12.0–15.0)
Immature Granulocytes: 2 %
Lymphocytes Relative: 6 %
Lymphs Abs: 0.8 10*3/uL (ref 0.7–4.0)
MCH: 32.1 pg (ref 26.0–34.0)
MCHC: 31.2 g/dL (ref 30.0–36.0)
MCV: 103 fL — ABNORMAL HIGH (ref 80.0–100.0)
Monocytes Absolute: 0.3 10*3/uL (ref 0.1–1.0)
Monocytes Relative: 2 %
Neutro Abs: 11.8 10*3/uL — ABNORMAL HIGH (ref 1.7–7.7)
Neutrophils Relative %: 89 %
Platelets: 97 10*3/uL — ABNORMAL LOW (ref 150–400)
RBC: 3.36 MIL/uL — ABNORMAL LOW (ref 3.87–5.11)
RDW: 15.5 % (ref 11.5–15.5)
WBC: 13.3 10*3/uL — ABNORMAL HIGH (ref 4.0–10.5)
nRBC: 0.2 % (ref 0.0–0.2)

## 2021-06-21 LAB — GLUCOSE, CAPILLARY
Glucose-Capillary: 120 mg/dL — ABNORMAL HIGH (ref 70–99)
Glucose-Capillary: 131 mg/dL — ABNORMAL HIGH (ref 70–99)
Glucose-Capillary: 132 mg/dL — ABNORMAL HIGH (ref 70–99)
Glucose-Capillary: 144 mg/dL — ABNORMAL HIGH (ref 70–99)
Glucose-Capillary: 92 mg/dL (ref 70–99)
Glucose-Capillary: 94 mg/dL (ref 70–99)

## 2021-06-21 IMAGING — DX DG ABD PORTABLE 1V
1 series · 1 of 1 positions shown · non-contrast
Comparison: None.

CLINICAL DATA: Nasogastric tube placement.

EXAM:
PORTABLE ABDOMEN - 1 VIEW

[abdomen supine]
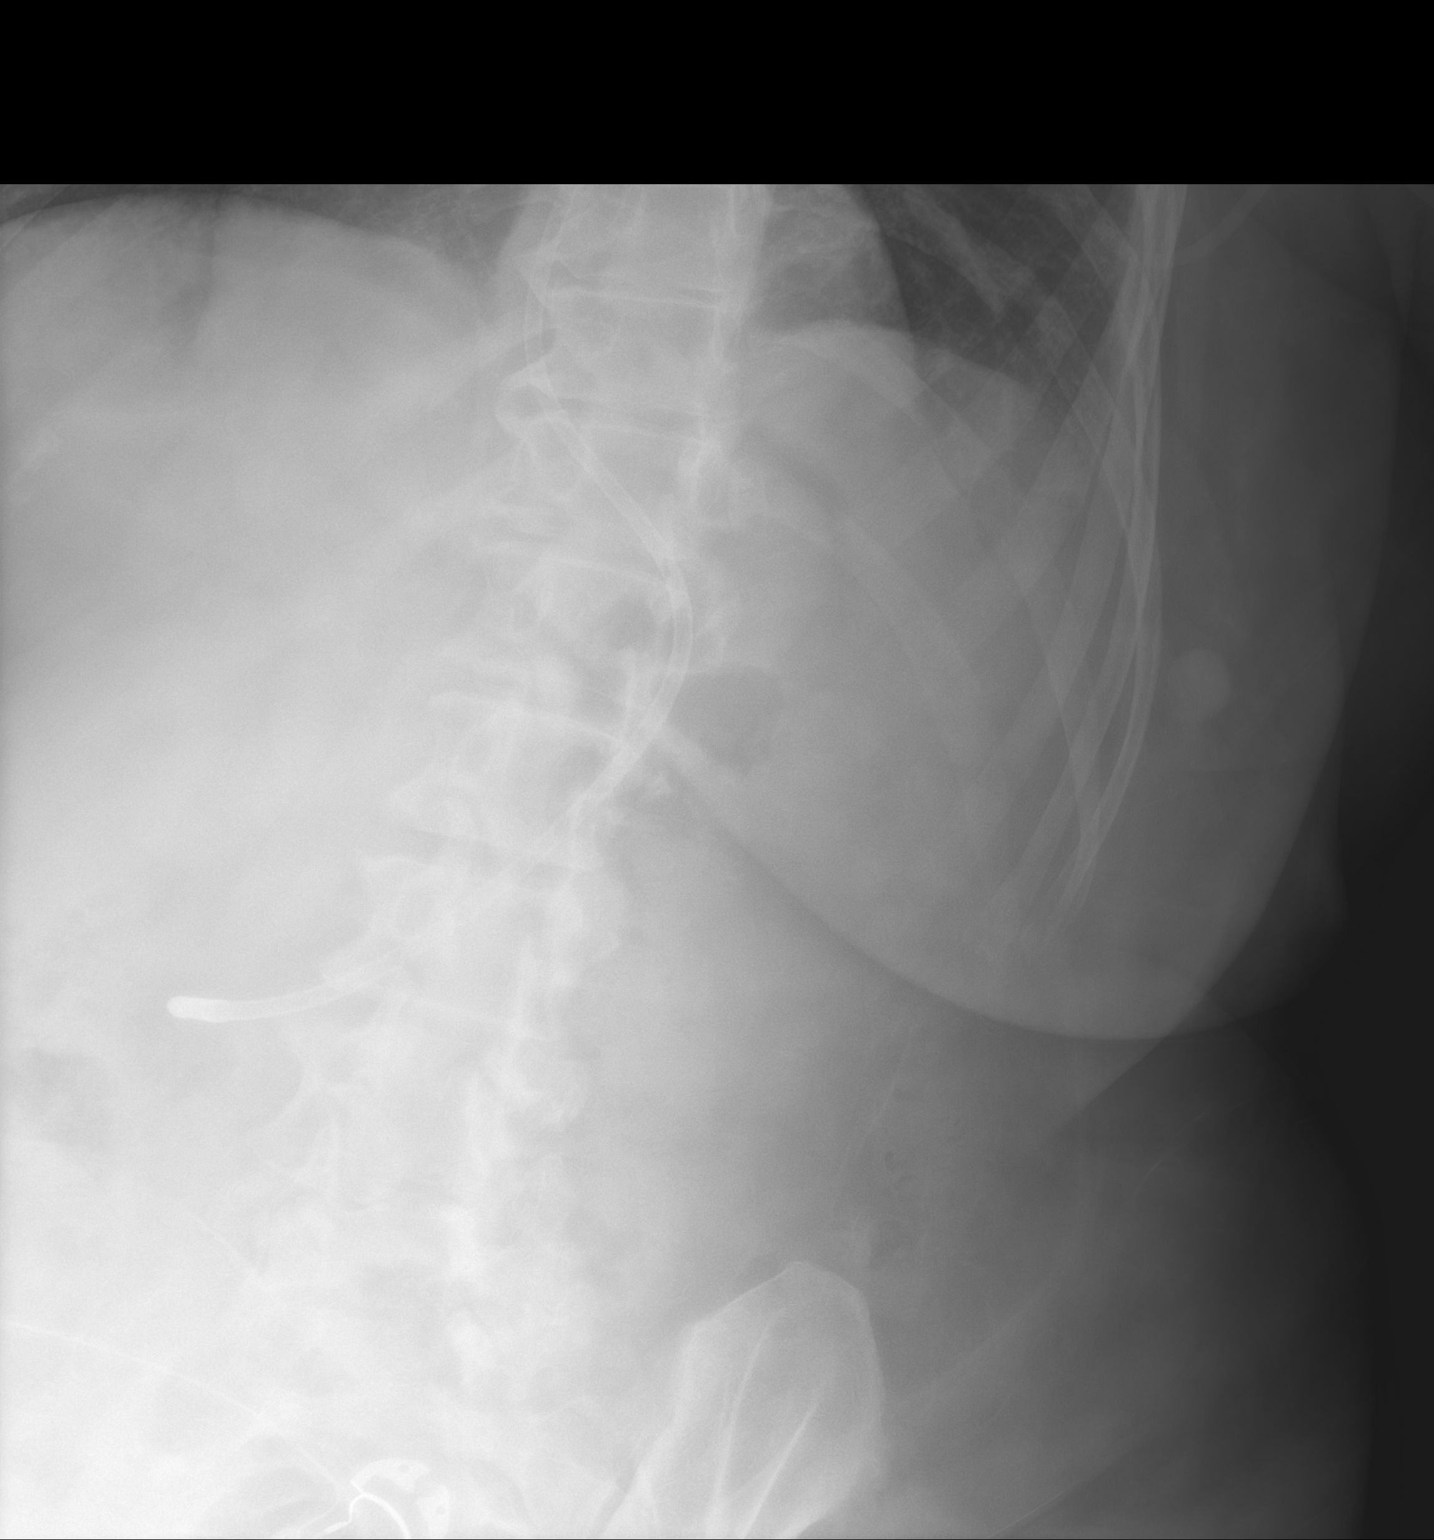

[1 of 1 positions shown; findings below may reference images not displayed]

FINDINGS: The bowel gas pattern is normal. Feeding tube tip is seen in
expected position of distal stomach. No radio-opaque calculi or
other significant radiographic abnormality are seen.
IMPRESSION: Feeding tube tip seen in expected position of distal stomach.

## 2021-06-21 IMAGING — DX DG ABD PORTABLE 1V
1 series · 1 of 1 positions shown · non-contrast
Comparison: [DATE]

CLINICAL DATA: Evaluate feeding tube placement

EXAM:
PORTABLE ABDOMEN - 1 VIEW

[abdomen supine]
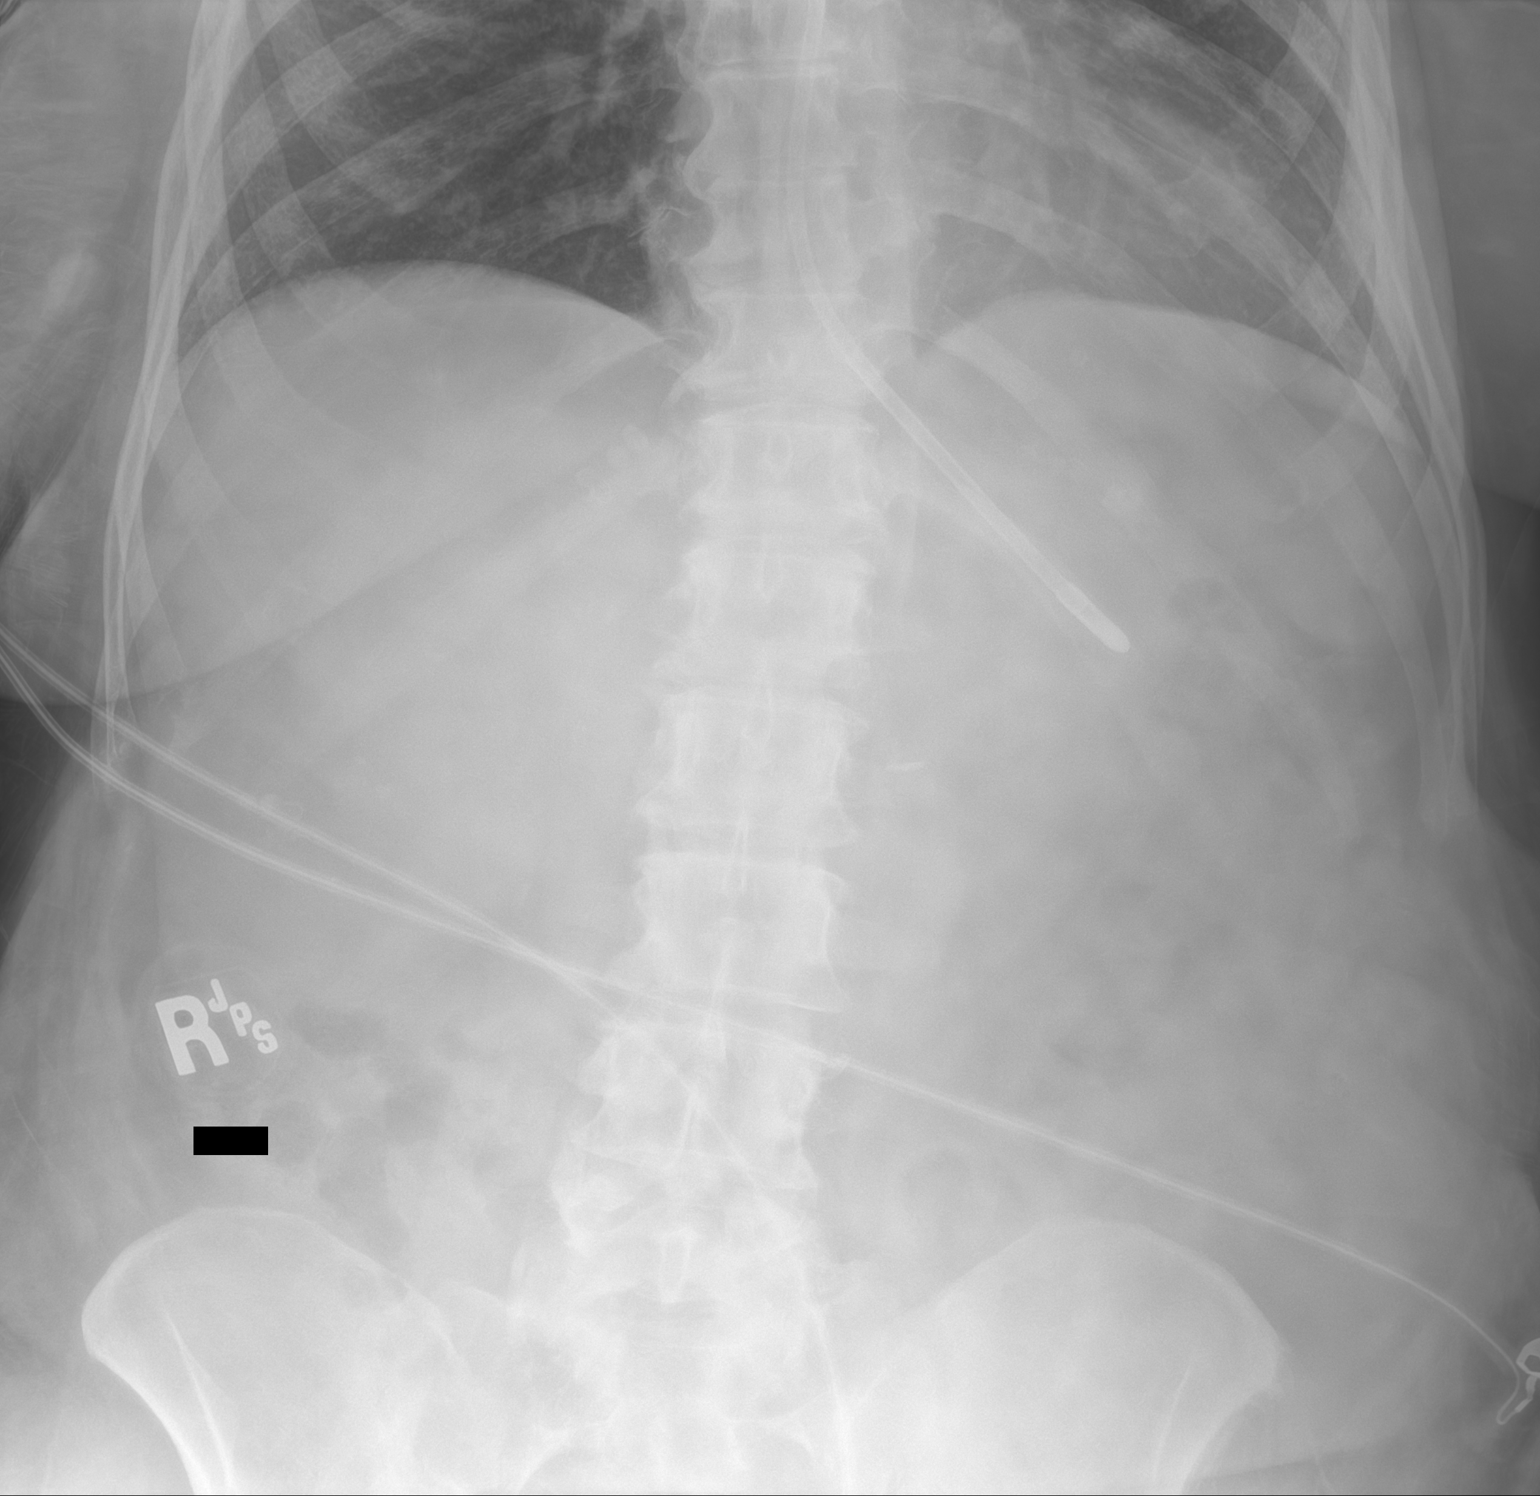

[1 of 1 positions shown; findings below may reference images not displayed]

FINDINGS: The feeding tube tip is 8.2 cm below the GE junction in the expected
location of the gastric fundus. Normal bowel gas pattern.
IMPRESSION: Feeding tube tip in the gastric fundus.

## 2021-06-21 MED ORDER — RIFAMPIN 300 MG PO CAPS
300.0000 mg | ORAL_CAPSULE | Freq: Two times a day (BID) | ORAL | Status: DC
  Administered 2021-06-21 – 2021-07-11 (×39): 300 mg via ORAL
  Filled 2021-06-21 (×41): qty 1

## 2021-06-21 MED ORDER — DEXTROSE 5 % IV BOLUS
1000.0000 mL | Freq: Once | INTRAVENOUS | Status: AC
  Administered 2021-06-21: 1000 mL via INTRAVENOUS

## 2021-06-21 NOTE — Progress Notes (Addendum)
[facility] Infectious Diseases Follow Up Note  Patient Identification: Patient Name: Kristy [name] MRN: [id number] [place] Date: 06/10/2021  8:56 PM Age: 75 y.o.Today's Date: 06/21/2021   Reason for Visit: Epidural abscess  Principal Problem:   Acute delirium Active Problems:   Fall   Alcoholic hepatitis   Altered mental status   Sepsis ([place])   Malnutrition of moderate degree  Antibiotics: nafcillin 8/9-current  Acyclovir 8/5-8/6 Ampicillin 8/5 - 8/8 Ceftriaxone 8/5-8/9 Vancomycin 8/6-8/8   Lines/Tubes: PIV external uretheral catheter, feeding tube   Interval Events: Continues to be afebrile, WBC is up to 15.9.  Mental status seems to be improving.  She is able to follow commands today.  No new update on cultures [place] of LUE negative for DVT   Assessment Cervical epidural abscess extending from( C2 to T3-T4) MSSA -blood cultures 8/5 no growth in 4 days.  TTE is negative for endocarditis   s/p C5-C6 Anterior Cervical Discectomy and Instrumented Fusion on 8/7. 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) Right SI joint septic arthritis/inflammatory collection in the right pelvis Urine culture positive for MSSA concerning for systemic infection: TEE is negative for vegetation  Thrombocytopenia-in the setting of sepsis Transaminitis: acute hep panel negative  Hypernatremia - Pharmacy changes NS component of Nafcillin to D5 to helpwith Na  AKI on CKD- Cr is downtrending   Recommendations Continue IV Nafcillin as is, plan for 2 weeks for IV Nafcillin from 8/7, then plan for 6 weeks course of cefazolin to complete 8 weeks of tx given diffuse vertebral involvement  Will add rifampin given 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 CMP ordered tomorrow given transaminitis.  ESR and CRP weekly  Following  peripherally over the weekend.  New ID team will follow up on Monday.   Plan discussed with patient/RN/ID pharmacy and Primary  Rest of the management as per the primary team. Thank you for the consult. Please page with pertinent questions or concerns.  ______________________________________________________________________ Subjective patient seen and examined at the bedside.  Patient is lying in bed.  Her mental status seems to be improving, she seems to be following commands  Vitals BP 124/65   Pulse 78   Temp 99.5 F (37.5 C) (Axillary)   Resp 20   Ht '5\' 6"'  (1.676 m)   Wt 79.5 kg   SpO2 100%   BMI 28.29 kg/m     Physical Exam Constitutional: Elderly female lying in bed, to follow commands    Comments: Has a feeding tube  Cardiovascular:     Rate and Rhythm: Normal rate and regular rhythm.     Heart sounds:   Pulmonary:     Effort: Pulmonary effort is normal.     Comments:   Abdominal:     Palpations: Abdomen is soft.     Tenderness: Nontender and nondistended  Musculoskeletal:        General: Left upper extremity swollen and tender  Skin:    Comments: No obvious lesions or rashes  Neurological:     General: unable to assess, plus bilaterally symmetrical and reactive  Psychiatric:        Mood and Affect: But arousable to voice, able to follow some commands.  She knows she is in the hospital   Pertinent Microbiology Results for orders placed or performed during the hospital encounter of 06/10/21  Resp Panel by RT-PCR (Flu A&B, Covid) Nasopharyngeal  Swab     Status: None   Collection Time: 06/11/21  3:13 AM   Specimen: Nasopharyngeal Swab; Nasopharyngeal(NP) swabs in vial transport medium  Result Value Ref Range Status   SARS Coronavirus 2 by RT PCR NEGATIVE NEGATIVE Final    Comment: (NOTE) SARS-CoV-2 target nucleic acids are NOT DETECTED.  The SARS-CoV-2 RNA is generally detectable in upper respiratory specimens during the acute phase of infection. The  lowest concentration of SARS-CoV-2 viral copies this assay can detect is 138 copies/mL. A negative result does not preclude SARS-Cov-2 infection and should not be used as the sole basis for treatment or other patient management decisions. A negative result may occur with  improper specimen collection/handling, submission of specimen other than nasopharyngeal swab, presence of viral mutation(s) within the areas targeted by this assay, and inadequate number of viral copies(<138 copies/mL). A negative result must be combined with clinical observations, patient history, and epidemiological information. The expected result is Negative.  Fact Sheet for Patients:  EntrepreneurPulse.com.au  Fact Sheet for Healthcare Providers:  IncredibleEmployment.be  This test is no t yet approved or cleared by the [place] FDA and  has been authorized for detection and/or diagnosis of SARS-CoV-2 by FDA under an Emergency Use Authorization (EUA). This EUA will remain  in effect (meaning this test can be used) for the duration of the COVID-19 declaration under Section 564(b)(1) of the Act, 21 U.S.C.section 360bbb-3(b)(1), unless the authorization is terminated  or revoked sooner.       Influenza A by PCR NEGATIVE NEGATIVE Final   Influenza B by PCR NEGATIVE NEGATIVE Final    Comment: (NOTE) The Xpert Xpress SARS-CoV-2/FLU/RSV plus assay is intended as an aid in the diagnosis of influenza from Nasopharyngeal swab specimens and should not be used as a sole basis for treatment. Nasal washings and aspirates are unacceptable for Xpert Xpress SARS-CoV-2/FLU/RSV testing.  Fact Sheet for Patients: EntrepreneurPulse.com.au  Fact Sheet for Healthcare Providers: IncredibleEmployment.be  This test is not yet approved or cleared by the [place] FDA and has been authorized for detection and/or diagnosis of SARS-CoV-2 by FDA under  an Emergency Use Authorization (EUA). This EUA will remain in effect (meaning this test can be used) for the duration of the COVID-19 declaration under Section 564(b)(1) of the Act, 21 U.S.C. section 360bbb-3(b)(1), unless the authorization is terminated or revoked.  Performed at [facility], [place] [address] [place], [place] [address]   Urine Culture     Status: Abnormal   Collection Time: 06/14/21  1:28 PM   Specimen: Urine, Clean Catch  Result Value Ref Range Status   Specimen Description URINE, CLEAN CATCH  Final   Special Requests   Final    NONE Performed at [facility], [place] [address] [place], [place] [address]    Culture >=100,000 COLONIES/mL STAPHYLOCOCCUS AUREUS (A)  Final   Report Status 06/16/2021 FINAL  Final   Organism ID, Bacteria STAPHYLOCOCCUS AUREUS (A)  Final      Susceptibility   Staphylococcus aureus - MIC*    CIPROFLOXACIN <=0.5 SENSITIVE Sensitive     GENTAMICIN <=0.5 SENSITIVE Sensitive     NITROFURANTOIN <=16 SENSITIVE Sensitive     OXACILLIN 0.5 SENSITIVE Sensitive     TETRACYCLINE <=1 SENSITIVE Sensitive     VANCOMYCIN <=0.5 SENSITIVE Sensitive     TRIMETH/SULFA <=10 SENSITIVE Sensitive     CLINDAMYCIN <=0.25 SENSITIVE Sensitive     RIFAMPIN <=0.5 SENSITIVE Sensitive     Inducible Clindamycin NEGATIVE Sensitive     * >=  100,000 COLONIES/mL STAPHYLOCOCCUS AUREUS  Culture, blood (routine x 2)     Status: None   Collection Time: 06/14/21  7:40 PM   Specimen: BLOOD  Result Value Ref Range Status   Specimen Description BLOOD RIGHT ANTECUBITAL  Final   Special Requests AEROBIC BOTTLE ONLY Blood Culture adequate volume  Final   Culture   Final    NO GROWTH 5 DAYS Performed at [facility], [address], [place], [place] [address]    Report Status 06/19/2021 FINAL  Final  Culture, blood (routine x 2)     Status: None   Collection Time: 06/14/21  8:13 PM   Specimen: BLOOD RIGHT HAND  Result Value Ref Range Status   Specimen  Description BLOOD RIGHT HAND  Final   Special Requests   Final    AEROBIC BOTTLE ONLY Blood Culture results may not be optimal due to an inadequate volume of blood received in culture bottles   Culture   Final    NO GROWTH 5 DAYS Performed at [facility], [place] [address], [place], [place] [address]    Report Status 06/19/2021 FINAL  Final  Surgical pcr screen     Status: Abnormal   Collection Time: 06/16/21 11:08 AM   Specimen: Nasal Mucosa; Nasal Swab  Result Value Ref Range Status   MRSA, PCR NEGATIVE NEGATIVE Final   Staphylococcus aureus POSITIVE (A) NEGATIVE Final    Comment: (NOTE) The Xpert SA Assay (FDA approved for NASAL specimens in patients 6 years of age and older), is one component of a comprehensive surveillance program. It is not intended to diagnose infection nor to guide or monitor treatment. Performed at [facility], [place] [address], [place], [place] [address]   Aerobic/Anaerobic Culture w Gram Stain (surgical/deep wound)     Status: None (Preliminary result)   Collection Time: 06/16/21 12:42 PM   Specimen: Abscess  Result Value Ref Range Status   Specimen Description ABSCESS  Final   Special Requests EPIDURAL ABSCESS  Final   Gram Stain   Final    FEW WBC PRESENT,BOTH PMN AND MONONUCLEAR FEW GRAM POSITIVE COCCI Performed at [facility], [address] [place], [place] [address]    Culture   Final    ABUNDANT STAPHYLOCOCCUS AUREUS NO ANAEROBES ISOLATED; CULTURE IN PROGRESS FOR 5 DAYS    Report Status PENDING  Incomplete   Organism ID, Bacteria STAPHYLOCOCCUS AUREUS  Final      Susceptibility   Staphylococcus aureus - MIC*    CIPROFLOXACIN <=0.5 SENSITIVE Sensitive     ERYTHROMYCIN <=0.25 SENSITIVE Sensitive     GENTAMICIN <=0.5 SENSITIVE Sensitive     OXACILLIN 0.5 SENSITIVE Sensitive     TETRACYCLINE <=1 SENSITIVE Sensitive     VANCOMYCIN 1 SENSITIVE Sensitive     TRIMETH/SULFA <=10 SENSITIVE Sensitive     CLINDAMYCIN <=0.25  SENSITIVE Sensitive     RIFAMPIN <=0.5 SENSITIVE Sensitive     Inducible Clindamycin NEGATIVE Sensitive     * ABUNDANT STAPHYLOCOCCUS AUREUS      Pertinent Lab. CBC Latest Ref Rng & Units 06/20/2021 06/19/2021 06/18/2021  WBC 4.0 - 10.5 K/uL 15.9(H) 14.3(H) 13.3(H)  Hemoglobin 12.0 - 15.0 g/dL 10.9(L) 11.8(L) 12.3  Hematocrit 36.0 - 46.0 % 33.1(L) 35.7(L) 37.0  Platelets 150 - 400 K/uL 110(L) 129(L) 140(L)   CMP Latest Ref Rng & Units 06/21/2021 06/20/2021 06/20/2021  Glucose 70 - 99 mg/dL 158(H) 152(H) 186(H)  BUN 8 - 23 mg/dL 100(H)  97(H) 100(H)  Creatinine 0.44 - 1.00 mg/dL 2.01(H) 2.01(H) 2.09(H)  Sodium 135 - 145 mmol/L 159(H) 159(H) 162(HH)  Potassium 3.5 - 5.1 mmol/L 3.3(L) 3.3(L) 3.3(L)  Chloride 98 - 111 mmol/L >130(HH) 129(H) >130(HH)  CO2 22 - 32 mmol/L '22 23 24  ' Calcium 8.9 - 10.3 mg/dL 8.2(L) 8.1(L) 8.1(L)  Total Protein 6.5 - 8.1 g/dL - - -  Total Bilirubin 0.3 - 1.2 mg/dL - - -  Alkaline Phos 38 - 126 U/L - - -  AST 15 - 41 U/L - - -  ALT 0 - 44 U/L - - -     Pertinent Imaging today Plain films and CT images have been personally visualized and interpreted; radiology reports have been reviewed. Decision making incorporated into the Impression / Recommendations.  TTE 06/18/21 Left ventricular ejection fraction, by estimation, is 65 to 70%. The left ventricle has normal function. The left ventricle has no regional wall motion abnormalities. Left ventricular diastolic parameters are consistent with Grade I diastolic dysfunction (impaired relaxation). 1. Right ventricular systolic function is normal. The right ventricular size is normal. There is normal pulmonary artery systolic pressure. 2. The mitral valve is normal in structure. Mild mitral valve regurgitation. No evidence of mitral stenosis. 3. The aortic valve is normal in structure. Aortic valve regurgitation is mild. No aortic stenosis is present. 4. The inferior vena cava is normal in size with greater than  50% respiratory variability, suggesting right atrial pressure of 3 mmHg.   I spent more than 35 minutes for this patient encounter including review of prior medical records, coordination of care  with greater than 50% of time being face to face/counseling and discussing diagnostics/treatment plan with the patient/family.  Electronically signed by:   Rosiland Oz, MD Infectious Disease Physician [facility] Pager: [phone]

## 2021-06-21 NOTE — Procedures (Signed)
Cortrak  Tube Type:  Cortrak - 43 inches Tube Location:  Left nare Initial Placement:  Stomach Secured by: Bridle Cortrak Secured At:  69 cm  Cortrak Tube Team Note:  Consult received to place a Cortrak feeding tube.   X-ray is required, abdominal x-ray has been ordered by the Cortrak team. Please confirm tube placement before using the Cortrak tube.   If the tube becomes dislodged please keep the tube and contact the Cortrak team at [email or web address] ([email or web address]) for replacement.  If after hours and replacement cannot be delayed, place a NG tube and confirm placement with an abdominal x-ray.    Betsey Holiday MS, RD, LDN Please refer to [facility] for RD and/or RD on-call/weekend/after hours pager

## 2021-06-21 NOTE — Progress Notes (Addendum)
PROGRESS NOTE  Kristy [name]  ZOX:[id number] DOB: [birth year] DOA: 06/10/2021 PCP: Darrow Bussing, MD   Brief Narrative: Kristy [name] is a 75 y.o. female with a history of alcohol use who presented to the ED for frequent falls, generalized weakness, and confusion. She was found to be afebrile, hypertensive, and without hypoxia. Also had hypokalemia (K 2.6), SCr 1.88 (above baseline ~1.5), LFT elevation (AST 576, ALT 305). Ammonia normal. EtOH, salicylate, tylenol levels negative. CT head and cervical spine were reassuring, though the patient was unable to rise to seated position from laying prompting consultation for admission. The patient was given potassium supplementation, started on CIWA protocol, and admitted with note made of continued AMS. She has remained weak and confused, though on 8/5 became less interactive, retaining urine, WBC noted to be rising. Blood and urine cultures ordered, ceftriaxone given with bacteriuria and urinary retention. MR brain has not been done yet. Neurology consulted with worsening mental status and nuchal rigidity. LP under fluoro requested, empirically treating meningitis/encephalitis.   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); Cervical discitis/osteomyelitis; UTI (POA); cannot rule out concurrent meninigitis - Neuro, ID, and Neurosurgery consulted and following - Appreciate insight and recommendations -TTE pending given gram-positive staph notable in both urine culture and on surgical culture of the neck, ID concerned for possible systemic or central etiology -S/P I/D of  06/16/21 - noted copious amounts of pus in the longus colli with myositis, discitis, osteomyelitis, large epidural abscess s/p drainage -cultures consistent with gram-positive staph, speciation  pending -Continue nafcillin per infectious disease, previously discontinued all other antibiotics  Acute metabolic encephalopathy: Multifactorial, likely also a chronic component; resolving.  - Neurology consulted - bedside LP unable to be performed - cultures to be obtained as above with abscess drainage - MRI remarkable for osteo/epidural abscess as above - Empiric coverage with vancomycin, ampicillin, ceftriaxone 2g, and acyclovir.  - Patient's mental status appears to be improving slowly, not yet back to baseline but much more interactive over the past 48 hours - Remains n.p.o., reconsult speech given patient's improved mental status  -Core track ongoing, noted to be coiled in mouth, repeat flatplate shows core track in place, resume tube feeds and free water flushes given ongoing hypernatremia and hyperchloremia as below  UTI, POA, resolved:  - Covered by above abx - treatment completed  Ambulatory dysfunction with fall at home, severe weakness:  - Secondary to above - PT/OT currently recommending SNF rehabilitation once acute infectious process under better control -    Alcoholic hepatitis:  - Based on AST predominance of LFT elevations. Hepatitis panel nonreactive.  - No indication for prednisolone at this time. Continue trending. - AST/ALT downtrending - bilirubin increased marginally    Alcohol abuse:  - Per family - 2 beers per day of late, etoh level negative in ED.  - Continue CIWA. Monitor closely for withdrawal. May DC in AM if no need for ativan. - Cessation counseling to be repeated once pt more clear-headed. - Empirically supplementing thiamine, folate, MVM.   Hypokalemia: - Supplemented with resolution   Hypernatremic/hyperchloremic, improving -Likely secondary to previous IV fluid boluses and concurrent IV medications -transitioned to D5 water as possible -Continues downtrending appropriately with increased free water flushes and D5 water IV fluids - Follow  closely  Severe  protein caloric malnutrition  -Continue tube feeds and free water flushes via core track NG tube - Albumin continues to downtrend despite increased tube feeds  HTN: - Continue ARB, and increased norvasc.    AKI on stage IIIb CKD:  -Labile in the setting of poor p.o. intake, IV fluids and initiation of free water flushes-we will titrate accordingly - Monitor renal function closely with initiation of vancomycin, acyclovir.  HLD: - Hold statin for now.  DVT prophylaxis: Lovenox 30mg  Code Status: Full Family Communication: None present, previously discussed at bedside with granddaughter Disposition Plan:  Status is: Inpatient  Remains inpatient appropriate because:Persistent severe electrolyte disturbances, Altered mental status, and Unsafe d/c plan  Dispo: The patient is from: Home              Anticipated d/c is to: SNF              Patient currently is not medically stable to d/c.   Difficult to place patient No  Consults: Neurology Psychiatry  Procedures:  None  Antimicrobials: Ceftriaxone, Vancomycin  Subjective: No acute issues or events overnight, patient much more awake alert this morning able to orient to person and place only review of systems somewhat limited but patient is requesting "soda"  Objective: Vitals:   06/20/21 2033 06/21/21 0100 06/21/21 0400 06/21/21 0500  BP: (!) 113/47 (!) 115/55 (!) 131/53   Pulse: 92 76 88   Resp: 19 20    Temp:  98.4 F (36.9 C) 98.1 F (36.7 C)   TempSrc:  Axillary Axillary   SpO2: 97% 99% 100%   Weight:    79.5 kg  Height:        Intake/Output Summary (Last 24 hours) at 06/21/2021 0743 Last data filed at 06/21/2021 0500 Gross per 24 hour  Intake 1830 ml  Output 2200 ml  Net -370 ml    Gen: 75 y.o. female in no distress resting comfortably today, oriented to person and place only Pulm: Nonlabored breathing room air. Clear. CV: Regular tachycardia without murmur, or gallop. No JVD or edema. GI:  Abdominal exam limited due to pt's withdrawal and yelling with any palpation of any part of her body. She does continue to guard diffusely however.  Ext: Warm, no deformities Skin: Anterior neck postoperative surgical site clean dry intact without erythema or purulence Neuro: Asleep but awakes with verbal/tactile stimuli, noncoherent/garbled speech - unable to follow simple commands Psych: Unable to assess  Data Reviewed: I have personally reviewed following labs and imaging studies  CBC: Recent Labs  Lab 06/16/21 0439 06/17/21 0417 06/18/21 0343 06/19/21 0412 06/20/21 0404  WBC 21.0* 15.2* 13.3* 14.3* 15.9*  HGB 11.8* 12.2 12.3 11.8* 10.9*  HCT 35.6* 36.9 37.0 35.7* 33.1*  MCV 95.7 96.6 95.9 99.2 100.6*  PLT 176 169 140* 129* 110*    Basic Metabolic Panel: Recent Labs  Lab 06/20/21 0404 06/20/21 1134 06/20/21 1617 06/20/21 2125 06/21/21 0352  NA 167* 163* 162* 159* 159*  K 3.6 3.5 3.3* 3.3* 3.3*  CL >130* >130* >130* 129* >130*  CO2 22 23 24 23 22   GLUCOSE 166* 196* 186* 152* 158*  BUN 109* 104* 100* 97* 100*  CREATININE 2.27* 2.13* 2.09* 2.01* 2.01*  CALCIUM 8.4* 8.2* 8.1* 8.1* 8.2*    GFR: Estimated Creatinine Clearance: 25.7 mL/min (A) (by C-G formula based on SCr of 2.01 mg/dL (H)). Liver Function Tests: Recent Labs  Lab 06/14/21 2013 06/19/21 0412  AST 120* 168*  ALT 136* 150*  ALKPHOS 104  133*  BILITOT 6.5* 3.0*  PROT 6.9 5.7*  ALBUMIN 1.6* 1.2*    No results for input(s): LIPASE, AMYLASE in the last 168 hours. No results for input(s): AMMONIA in the last 168 hours.  Coagulation Profile: No results for input(s): INR, PROTIME in the last 168 hours.  Cardiac Enzymes: No results for input(s): CKTOTAL, CKMB, CKMBINDEX, TROPONINI in the last 168 hours.  BNP (last 3 results) No results for input(s): PROBNP in the last 8760 hours. HbA1C: No results for input(s): HGBA1C in the last 72 hours. CBG: Recent Labs  Lab 06/20/21 1104 06/20/21 1609  06/20/21 2031 06/20/21 2339 06/21/21 0404  GLUCAP 161* 184* 123* 132* 144*    Lipid Profile: No results for input(s): CHOL, HDL, LDLCALC, TRIG, CHOLHDL, LDLDIRECT in the last 72 hours. Thyroid Function Tests: No results for input(s): TSH, T4TOTAL, FREET4, T3FREE, THYROIDAB in the last 72 hours.  Anemia Panel: No results for input(s): VITAMINB12, FOLATE, FERRITIN, TIBC, IRON, RETICCTPCT in the last 72 hours.  Urine analysis:    Component Value Date/Time   COLORURINE AMBER (A) 06/14/2021 1338   APPEARANCEUR HAZY (A) 06/14/2021 1338   LABSPEC 1.011 06/14/2021 1338   PHURINE 6.0 06/14/2021 1338   GLUCOSEU NEGATIVE 06/14/2021 1338   HGBUR LARGE (A) 06/14/2021 1338   BILIRUBINUR NEGATIVE 06/14/2021 1338   KETONESUR NEGATIVE 06/14/2021 1338   PROTEINUR NEGATIVE 06/14/2021 1338   UROBILINOGEN 0.2 05/14/2008 0020   NITRITE NEGATIVE 06/14/2021 1338   LEUKOCYTESUR NEGATIVE 06/14/2021 1338   Recent Results (from the past 240 hour(s))  Urine Culture     Status: Abnormal   Collection Time: 06/14/21  1:28 PM   Specimen: Urine, Clean Catch  Result Value Ref Range Status   Specimen Description URINE, CLEAN CATCH  Final   Special Requests   Final    NONE Performed at [facility], [address] [place], [place] [address]    Culture >=100,000 COLONIES/mL STAPHYLOCOCCUS AUREUS (A)  Final   Report Status 06/16/2021 FINAL  Final   Organism ID, Bacteria STAPHYLOCOCCUS AUREUS (A)  Final      Susceptibility   Staphylococcus aureus - MIC*    CIPROFLOXACIN <=0.5 SENSITIVE Sensitive     GENTAMICIN <=0.5 SENSITIVE Sensitive     NITROFURANTOIN <=16 SENSITIVE Sensitive     OXACILLIN 0.5 SENSITIVE Sensitive     TETRACYCLINE <=1 SENSITIVE Sensitive     VANCOMYCIN <=0.5 SENSITIVE Sensitive     TRIMETH/SULFA <=10 SENSITIVE Sensitive     CLINDAMYCIN <=0.25 SENSITIVE Sensitive     RIFAMPIN <=0.5 SENSITIVE Sensitive     Inducible Clindamycin NEGATIVE Sensitive     * >=100,000 COLONIES/mL  STAPHYLOCOCCUS AUREUS  Culture, blood (routine x 2)     Status: None   Collection Time: 06/14/21  7:40 PM   Specimen: BLOOD  Result Value Ref Range Status   Specimen Description BLOOD RIGHT ANTECUBITAL  Final   Special Requests AEROBIC BOTTLE ONLY Blood Culture adequate volume  Final   Culture   Final    NO GROWTH 5 DAYS Performed at [facility], [address] [place], [place] [address]    Report Status 06/19/2021 FINAL  Final  Culture, blood (routine x 2)     Status: None   Collection Time: 06/14/21  8:13 PM   Specimen: BLOOD RIGHT HAND  Result Value Ref Range Status   Specimen Description BLOOD RIGHT HAND  Final   Special Requests   Final    AEROBIC BOTTLE ONLY Blood Culture results  may not be optimal due to an inadequate volume of blood received in culture bottles   Culture   Final    NO GROWTH 5 DAYS Performed at [facility], [address] [place], [place] [address]    Report Status 06/19/2021 FINAL  Final  Surgical pcr screen     Status: Abnormal   Collection Time: 06/16/21 11:08 AM   Specimen: Nasal Mucosa; Nasal Swab  Result Value Ref Range Status   MRSA, PCR NEGATIVE NEGATIVE Final   Staphylococcus aureus POSITIVE (A) NEGATIVE Final    Comment: (NOTE) The Xpert SA Assay (FDA approved for NASAL specimens in patients 75 years of age and older), is one component of a comprehensive surveillance program. It is not intended to diagnose infection nor to guide or monitor treatment. Performed at [facility], [address] [place], [place] [address]   Aerobic/Anaerobic Culture w Gram Stain (surgical/deep wound)     Status: None (Preliminary result)   Collection Time: 06/16/21 12:42 PM   Specimen: Abscess  Result Value Ref Range Status   Specimen Description ABSCESS  Final   Special Requests EPIDURAL ABSCESS  Final   Gram Stain   Final    FEW WBC PRESENT,BOTH PMN AND MONONUCLEAR FEW GRAM POSITIVE COCCI Performed at [facility], [address] [place], [place] [address]    Culture   Final    ABUNDANT STAPHYLOCOCCUS AUREUS NO ANAEROBES ISOLATED; CULTURE IN PROGRESS FOR 5 DAYS    Report Status PENDING  Incomplete   Organism ID, Bacteria STAPHYLOCOCCUS AUREUS  Final      Susceptibility   Staphylococcus aureus - MIC*    CIPROFLOXACIN <=0.5 SENSITIVE Sensitive     ERYTHROMYCIN <=0.25 SENSITIVE Sensitive     GENTAMICIN <=0.5 SENSITIVE Sensitive     OXACILLIN 0.5 SENSITIVE Sensitive     TETRACYCLINE <=1 SENSITIVE Sensitive     VANCOMYCIN 1 SENSITIVE Sensitive     TRIMETH/SULFA <=10 SENSITIVE Sensitive     CLINDAMYCIN <=0.25 SENSITIVE Sensitive     RIFAMPIN <=0.5 SENSITIVE Sensitive     Inducible Clindamycin NEGATIVE Sensitive     * ABUNDANT STAPHYLOCOCCUS AUREUS      Radiology Studies: VAS US UPPER EXTREMITY VENOUS DUPLEX  Result Date: 06/19/2021 UPPER VENOUS STUDY  Patient Name:  Heron NayLINDA T Frericks  Date of Exam:   06/19/2021 Medical Rec #: [id number]      Accession #:    [id number] Date of Birth: 07/07/1946       Patient Gender: F Patient Age:   [age] years [facility]  Procedure:      VAS US UPPER EXTREMITY VENOUS DUPLEX Referring Phys: Odette FractionSABINA MANANDHAR --------------------------------------------------------------------------------  Indications: Pain, and Swelling Risk Factors: IV's in LUE (hand and forearm). Limitations: Poor ultrasound/tissue interface and IV bandage, and patient pain intolerance. Comparison Study: No previous exams Performing Technologist: Jody Hill RVT, RDMS  Examination Guidelines: A complete evaluation includes B-mode imaging, spectral Doppler, color Doppler, and power Doppler as needed of all accessible portions of each vessel. Bilateral testing is considered an integral part of a complete examination. Limited examinations for reoccurring indications may be performed as noted.  Right Findings: +----------+------------+---------+-----------+----------+-------+ RIGHT      CompressiblePhasicitySpontaneousPropertiesSummary +----------+------------+---------+-----------+----------+-------+ Subclavian    Full       Yes       Yes                      +----------+------------+---------+-----------+----------+-------+  Left Findings: +----------+------------+---------+-----------+----------+-------+  LEFT      CompressiblePhasicitySpontaneousPropertiesSummary +----------+------------+---------+-----------+----------+-------+ IJV           Full       Yes       Yes                      +----------+------------+---------+-----------+----------+-------+ Subclavian    Full       Yes       Yes                      +----------+------------+---------+-----------+----------+-------+ Axillary      Full       Yes       Yes                      +----------+------------+---------+-----------+----------+-------+ Brachial      Full       Yes       Yes                      +----------+------------+---------+-----------+----------+-------+ Radial        Full                                          +----------+------------+---------+-----------+----------+-------+ Ulnar         Full                                          +----------+------------+---------+-----------+----------+-------+ Cephalic      Full                                          +----------+------------+---------+-----------+----------+-------+ Basilic       Full       Yes       Yes                      +----------+------------+---------+-----------+----------+-------+ Limited visualization of ulnar and radial veins due to IV placement, edema, and patient pain intolerance.  Summary:  Right: No evidence of thrombosis in the subclavian.  Left: No evidence of deep vein thrombosis in the upper extremity. No evidence of superficial vein thrombosis in the upper extremity.  *See table(s) above for measurements and observations.  Diagnosing physician: Coral Else MD  Electronically signed by Coral Else MD on 06/19/2021 at 6:54:25 PM.    Final     Scheduled Meds:  allopurinol  300 mg Per Tube Daily   amLODipine  10 mg Per Tube Daily   aspirin  81 mg Per Tube Daily   Chlorhexidine Gluconate Cloth  6 each Topical Daily   feeding supplement (OSMOLITE 1.2 CAL)  1,000 mL Per Tube Q24H   feeding supplement (PROSource TF)  45 mL Per Tube BID   folic acid  1 mg Per Tube Daily   free water  115 mL Per Tube Q4H   multivitamin with minerals  1 tablet Per Tube Daily   mupirocin ointment   Nasal BID   [START ON 06/22/2021] thiamine injection  100 mg Intravenous Daily   Continuous Infusions:  dextrose 5 % 500 mL with nafcillin 12 g infusion 20.8 mL/hr at 06/20/21 1256   dextrose  thiamine injection 250 mg (06/20/21 1215)     LOS: 10 days   Time spent: 35 minutes.  Azucena Fallen, DO [facility] Pager: Secure chat  06/21/2021, 7:43 AM

## 2021-06-21 NOTE — Evaluation (Signed)
Clinical/Bedside Swallow Evaluation Patient Details  Name: Kristy [name] MRN: [id number] Date of Birth: 1946-10-11  Today's Date: 06/21/2021 Time: SLP Start Time (ACUTE ONLY): 1400 SLP Stop Time (ACUTE ONLY): 1420 SLP Time Calculation (min) (ACUTE ONLY): 20 min  Past Medical History: History reviewed. No pertinent past medical history. Past Surgical History:  Past Surgical History:  Procedure Laterality Date   ANTERIOR CERVICAL DECOMP/DISCECTOMY FUSION N/A 06/16/2021   Procedure: ANTERIOR CERVICAL DISCECTOMY FUSION Cervical five -Cervical six, Evacuation of Epidural Abscess;  Surgeon: Judith Part, MD;  Location: [place];  Service: Neurosurgery;  Laterality: N/A;   HPI:  Pt is a 75yo female presenting to [facility] ED on 8/1 after a fall on 7/29. Pt also fell on 8/2 while in the ED. Pt found to be hypokalemic upon admission. 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 Pt is now s/p drainage of abscess and C5-6 ACDF on 06/16/21. Also noted R SI septic arthritis.  PMH: HTN, polycthemia, alcohol use.   Assessment / Plan / Recommendation Clinical Impression  Pt participated in limited bedside swallow assessment.  She has persisting dysphagia. She answered basic questions; kept eyes closed for duration of session.  She would not allow me to elevate HOB further, nor did she follow commands to lower chin into neutral position. Neck remained in extension and efforts to lower chin slightly were met with moans/yelling.  Oral care provided. Pt holding significant amounts of secretions orally and pharyngeally- attempted to suction as much as possible. Pt was unable to tolerate several trials of ice chips - efforts to swallow were followed by consistent coughing, concerning for aspiration.    REC continuing NPO status -  pt is not managing her secretions.  Given ACDF, she would benefit from an MBS to determine if prevertebral swelling is  impacting function.  Will follow. SLP Visit Diagnosis: Dysphagia, pharyngeal phase (R13.13)    Aspiration Risk  Severe aspiration risk    Diet Recommendation          Other  Recommendations Oral Care Recommendations: Oral care QID Other Recommendations: Have oral suction available   Follow up Recommendations        Frequency and Duration            Prognosis        Swallow Study   General Date of Onset: 06/10/21 HPI: Pt is a 75yo female presenting to [facility] ED on 8/1 after a fall on 7/29. Pt also fell on 8/2 while in the ED. Pt found to be hypokalemic upon admission. 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 Pt is now s/p drainage of abscess and C5-6 ACDF on 06/16/21. Also noted R SI septic arthritis.  PMH: HTN, polycthemia, alcohol use. Type of Study: Bedside Swallow Evaluation Previous Swallow Assessment: bedside swallow eval 8/6 Diet Prior to this Study: Regular Temperature Spikes Noted: No Respiratory Status: Room air History of Recent Intubation:  (for procedure) Behavior/Cognition: Lethargic/Drowsy Oral Cavity Assessment: Excessive secretions Oral Care Completed by SLP: Yes Vision:  (kept eyes closed) Self-Feeding Abilities: Total assist Patient Positioning: Upright in bed Baseline Vocal Quality: Low vocal intensity Volitional Cough: Weak Volitional Swallow: Unable to elicit    Oral/Motor/Sensory Function Overall Oral Motor/Sensory Function: Other (comment) (resistant to commands to assess function)   Ice Chips Ice chips: Impaired Presentation: Spoon Pharyngeal Phase Impairments: Suspected delayed Swallow;Cough - Immediate   Thin Liquid Thin Liquid: Not tested  Nectar Thick Nectar Thick Liquid: Not tested   Honey Thick Honey Thick Liquid: Not tested   Puree Puree: Not tested   Solid    Andrick Rust L. Tivis Ringer, MA CCC/SLP Acute Rehabilitation Services Office number [phone] Pager  [phone]  Solid: Not tested      Juan Quam [facility] 06/21/2021,3:02 PM

## 2021-06-21 NOTE — Plan of Care (Signed)
  Problem: Clinical Measurements: Goal: Ability to maintain clinical measurements within normal limits will improve Outcome: Progressing Goal: Will remain free from infection Outcome: Progressing Goal: Diagnostic test results will improve Outcome: Progressing Goal: Respiratory complications will improve Outcome: Progressing Goal: Cardiovascular complication will be avoided Outcome: Progressing   Problem: Nutrition: Goal: Adequate nutrition will be maintained Outcome: Progressing   Problem: Coping: Goal: Level of anxiety will decrease Outcome: Progressing   Problem: Elimination: Goal: Will not experience complications related to bowel motility Outcome: Progressing Goal: Will not experience complications related to urinary retention Outcome: Progressing   Problem: Pain Managment: Goal: General experience of comfort will improve Outcome: Progressing   Problem: Safety: Goal: Ability to remain free from injury will improve Outcome: Progressing   Problem: Skin Integrity: Goal: Risk for impaired skin integrity will decrease Outcome: Progressing   Problem: Education: Goal: Knowledge of General Education information will improve Description: Including pain rating scale, medication(s)/side effects and non-pharmacologic comfort measures Outcome: Not Progressing   Problem: Health Behavior/Discharge Planning: Goal: Ability to manage health-related needs will improve Outcome: Not Progressing   Problem: Activity: Goal: Risk for activity intolerance will decrease Outcome: Not Progressing   

## 2021-06-21 NOTE — Progress Notes (Signed)
PHARMACY CONSULT NOTE FOR:  OUTPATIENT  PARENTERAL ANTIBIOTIC THERAPY (OPAT)  Indication: MSSA epidural abscess  Regimen:  -Nafcillin 12g IV q24h x2 weeks (8/7>>8/21) followed by -Cefazolin 2g IV q12h (8/22>>10/3)  End date: 08/12/21  IV antibiotic discharge orders are pended. To discharging provider:  please sign these orders via discharge navigator,  Select New Orders & click on the button choice - Manage This Unsigned Work.     Thank you for allowing pharmacy to be a part of this patient's care.  Georgeann Oppenheim 06/21/2021, 11:14 AM

## 2021-06-21 NOTE — Progress Notes (Signed)
Critical Result:  Chloride >130  Reported by [place] @0457   Handed off to @0505   Will continue to monitor.

## 2021-06-22 LAB — BASIC METABOLIC PANEL
Anion gap: 9 (ref 5–15)
Anion gap: 7 (ref 5–15)
BUN: 89 mg/dL — ABNORMAL HIGH (ref 8–23)
BUN: 87 mg/dL — ABNORMAL HIGH (ref 8–23)
CO2: 24 mmol/L (ref 22–32)
CO2: 23 mmol/L (ref 22–32)
Calcium: 8.2 mg/dL — ABNORMAL LOW (ref 8.9–10.3)
Calcium: 7.9 mg/dL — ABNORMAL LOW (ref 8.9–10.3)
Chloride: 124 mmol/L — ABNORMAL HIGH (ref 98–111)
Chloride: 122 mmol/L — ABNORMAL HIGH (ref 98–111)
Creatinine, Ser: 1.83 mg/dL — ABNORMAL HIGH (ref 0.44–1.00)
Creatinine, Ser: 1.93 mg/dL — ABNORMAL HIGH (ref 0.44–1.00)
GFR, Estimated: 28 mL/min — ABNORMAL LOW (ref >60)
GFR, Estimated: 27 mL/min — ABNORMAL LOW (ref >60)
Glucose, Bld: 171 mg/dL — ABNORMAL HIGH (ref 70–99)
Glucose, Bld: 254 mg/dL — ABNORMAL HIGH (ref 70–99)
Potassium: 3.3 mmol/L — ABNORMAL LOW (ref 3.5–5.1)
Potassium: 2.9 mmol/L — ABNORMAL LOW (ref 3.5–5.1)
Sodium: 157 mmol/L — ABNORMAL HIGH (ref 135–145)
Sodium: 152 mmol/L — ABNORMAL HIGH (ref 135–145)

## 2021-06-22 LAB — CBC
HCT: 29.5 % — ABNORMAL LOW (ref 36.0–46.0)
Hemoglobin: 9.4 g/dL — ABNORMAL LOW (ref 12.0–15.0)
MCH: 32.5 pg (ref 26.0–34.0)
MCHC: 31.9 g/dL (ref 30.0–36.0)
MCV: 102.1 fL — ABNORMAL HIGH (ref 80.0–100.0)
Platelets: 92 10*3/uL — ABNORMAL LOW (ref 150–400)
RBC: 2.89 MIL/uL — ABNORMAL LOW (ref 3.87–5.11)
RDW: 15.1 % (ref 11.5–15.5)
WBC: 10.7 10*3/uL — ABNORMAL HIGH (ref 4.0–10.5)
nRBC: 0.2 % (ref 0.0–0.2)

## 2021-06-22 LAB — GLUCOSE, CAPILLARY
Glucose-Capillary: 157 mg/dL — ABNORMAL HIGH (ref 70–99)
Glucose-Capillary: 149 mg/dL — ABNORMAL HIGH (ref 70–99)
Glucose-Capillary: 138 mg/dL — ABNORMAL HIGH (ref 70–99)
Glucose-Capillary: 224 mg/dL — ABNORMAL HIGH (ref 70–99)
Glucose-Capillary: 138 mg/dL — ABNORMAL HIGH (ref 70–99)

## 2021-06-22 MED ORDER — DEXTROSE 5 % IV BOLUS
1000.0000 mL | Freq: Once | INTRAVENOUS | Status: AC
  Administered 2021-06-22: 1000 mL via INTRAVENOUS

## 2021-06-22 NOTE — Progress Notes (Addendum)
PROGRESS NOTE  Kristy [name]  GUY:[id number] DOB: [birth year] DOA: 06/10/2021 PCP: Darrow Bussing, MD   Brief Narrative: Kristy [name] is a 75 y.o. female with a history of alcohol use who presented to the ED for frequent falls, generalized weakness, and confusion. She was found to be afebrile, hypertensive, and without hypoxia. Also had hypokalemia (K 2.6), SCr 1.88 (above baseline ~1.5), LFT elevation (AST 576, ALT 305). Ammonia normal. EtOH, salicylate, tylenol levels negative. CT head and cervical spine were reassuring, though the patient was unable to rise to seated position from laying prompting consultation for admission. The patient was given potassium supplementation, started on CIWA protocol, and admitted with note made of continued AMS. She has remained weak and confused, though on 8/5 became less interactive, retaining urine, WBC noted to be rising. Blood and urine cultures ordered, ceftriaxone given with bacteriuria and urinary retention. MR brain has not been done yet. Neurology consulted with worsening mental status and nuchal rigidity. LP under fluoro requested, empirically treating meningitis/encephalitis.   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); Cervical discitis/osteomyelitis; UTI (POA); cannot rule out concurrent meninigitis - Neuro, ID, and Neurosurgery consulted and following - Appreciate insight and recommendations -TTE unremarkable for overt vegetation - ID initially concerned for possible systemic or central etiology given multiple sites of staph infection -S/P I/D of  06/16/21 - noted copious amounts of pus in the longus colli with myositis, discitis, osteomyelitis, large epidural abscess s/p drainage -cultures consistent with gram-positive staph, speciation pending -Continue  nafcillin per infectious disease, previously discontinued all other antibiotics  Acute metabolic encephalopathy: Multifactorial, likely also a chronic component; resolving.  - Neurology consulted - bedside LP unable to be performed - cultures to be obtained as above with abscess drainage - MRI remarkable for osteo/epidural abscess as above - Empiric coverage with vancomycin, ampicillin, ceftriaxone 2g, and acyclovir.  - Patient's mental status appears to be improving slowly, not yet back to baseline but much more interactive over the past 48 hours - Remains n.p.o., reconsult speech given patient's improved mental status  -Core track ongoing, noted to be coiled in mouth, repeat flatplate shows core track in place, resume tube feeds and free water flushes given ongoing hypernatremia and hyperchloremia as below  Hypernatremic/hyperchloremic, improving -Likely secondary to previous IV fluid boluses and concurrent IV medications - transitioned to D5 water as possible -Continues downtrending appropriately with increased free water flushes and D5 water IV fluids  UTI, POA, resolved:  - Covered by above abx - treatment completed  Ambulatory dysfunction with fall at home, severe weakness:  - Secondary to above - PT/OT currently recommending SNF rehabilitation once acute infectious process under better control -    Alcoholic hepatitis:  - Based on AST predominance of LFT elevations. Hepatitis panel nonreactive.  - No indication for prednisolone at this time. Continue trending. - AST/ALT downtrending - bilirubin increased marginally    Alcohol abuse:  - Per family - 2 beers per day of late, etoh level negative in ED.  - Continue CIWA. Monitor closely for withdrawal. May DC in AM if no need for ativan. - Cessation counseling to be repeated once pt more clear-headed. - Empirically supplementing thiamine, folate, MVM.   Hypokalemia: - Supplemented with resolution   Severe protein caloric  malnutrition  -Continue tube feeds  and free water flushes via core track NG tube - Albumin continues to downtrend despite increased tube feeds  HTN: - Continue ARB, and increased norvasc.    AKI on stage IIIb CKD:  -Labile in the setting of poor p.o. intake, IV fluids and initiation of free water flushes-we will titrate accordingly - Monitor renal function closely with initiation of vancomycin, acyclovir.  HLD: - Hold statin for now.  DVT prophylaxis: Lovenox 30mg  Code Status: Full Family Communication: None present, previously discussed at bedside with granddaughter Disposition Plan:  Status is: Inpatient  Remains inpatient appropriate because:Persistent severe electrolyte disturbances, Altered mental status, and Unsafe d/c plan  Dispo: The patient is from: Home              Anticipated d/c is to: SNF              Patient currently is not medically stable to d/c.   Difficult to place patient No  Consults: Neurology Psychiatry  Procedures:  None  Antimicrobials: Nafcillin  Subjective: No acute issues or events overnight, patient continues to be somnolent but arousable, able to answer yes/no questions appropriately today making moderate improvements over the past 48 hours, review of systems still somewhat limited but patient denies any pain fevers or chills.  Objective: Vitals:   06/21/21 1914 06/21/21 2344 06/22/21 0231 06/22/21 0400  BP:  (!) 107/49  (!) 105/53  Pulse:  85  80  Resp: 15 16  16   Temp:  98.1 F (36.7 C)  98 F (36.7 C)  TempSrc:  Axillary  Oral  SpO2:  99%  100%  Weight:   79.7 kg   Height:        Intake/Output Summary (Last 24 hours) at 06/22/2021 0730 Last data filed at 06/22/2021 0231 Gross per 24 hour  Intake 1142.33 ml  Output 2075 ml  Net -932.67 ml    Gen: 75 y.o. female in no distress resting comfortably today, oriented to person and place only Pulm: Nonlabored breathing room air. Clear. CV: Regular tachycardia without murmur, or  gallop. No JVD or edema. GI: Abdominal exam limited due to pt's withdrawal and yelling with any palpation of any part of her body. She does continue to guard diffusely however.  Ext: Warm, no deformities Skin: Anterior neck postoperative surgical site clean dry intact without erythema or purulence Neuro: Asleep but awakes with verbal/tactile stimuli, able to follow simple commands oriented to person and place  Data Reviewed: I have personally reviewed following labs and imaging studies  CBC: Recent Labs  Lab 06/17/21 0417 06/18/21 0343 06/19/21 0412 06/20/21 0404 06/21/21 1010  WBC 15.2* 13.3* 14.3* 15.9* 13.3*  NEUTROABS  --   --   --   --  11.8*  HGB 12.2 12.3 11.8* 10.9* 10.8*  HCT 36.9 37.0 35.7* 33.1* 34.6*  MCV 96.6 95.9 99.2 100.6* 103.0*  PLT 169 140* 129* 110* 97*    Basic Metabolic Panel: Recent Labs  Lab 06/20/21 1617 06/20/21 2125 06/21/21 0352 06/21/21 1735 06/22/21 0550  NA 162* 159* 159* 153* 157*  K 3.3* 3.3* 3.3* 3.1* 3.3*  CL >130* 129* >130* 125* 124*  CO2 24 23 22  21* 24  GLUCOSE 186* 152* 158* 157* 171*  BUN 100* 97* 100* 94* 89*  CREATININE 2.09* 2.01* 2.01* 1.90* 1.83*  CALCIUM 8.1* 8.1* 8.2* 7.8* 8.2*    GFR: Estimated Creatinine Clearance: 28.3 mL/min (A) (by C-G formula based on SCr of 1.83 mg/dL (H)). Liver Function Tests: Recent Labs  Lab  06/19/21 0412  AST 168*  ALT 150*  ALKPHOS 133*  BILITOT 3.0*  PROT 5.7*  ALBUMIN 1.2*    No results for input(s): LIPASE, AMYLASE in the last 168 hours. No results for input(s): AMMONIA in the last 168 hours.  Coagulation Profile: No results for input(s): INR, PROTIME in the last 168 hours.  Cardiac Enzymes: No results for input(s): CKTOTAL, CKMB, CKMBINDEX, TROPONINI in the last 168 hours.  BNP (last 3 results) No results for input(s): PROBNP in the last 8760 hours. HbA1C: No results for input(s): HGBA1C in the last 72 hours. CBG: Recent Labs  Lab 06/21/21 1126 06/21/21 1527  06/21/21 2031 06/21/21 2342 06/22/21 0650  GLUCAP 92 120* 131* 132* 157*    Lipid Profile: No results for input(s): CHOL, HDL, LDLCALC, TRIG, CHOLHDL, LDLDIRECT in the last 72 hours. Thyroid Function Tests: No results for input(s): TSH, T4TOTAL, FREET4, T3FREE, THYROIDAB in the last 72 hours.  Anemia Panel: No results for input(s): VITAMINB12, FOLATE, FERRITIN, TIBC, IRON, RETICCTPCT in the last 72 hours.  Urine analysis:    Component Value Date/Time   COLORURINE AMBER (A) 06/14/2021 1338   APPEARANCEUR HAZY (A) 06/14/2021 1338   LABSPEC 1.011 06/14/2021 1338   PHURINE 6.0 06/14/2021 1338   GLUCOSEU NEGATIVE 06/14/2021 1338   HGBUR LARGE (A) 06/14/2021 1338   BILIRUBINUR NEGATIVE 06/14/2021 1338   KETONESUR NEGATIVE 06/14/2021 1338   PROTEINUR NEGATIVE 06/14/2021 1338   UROBILINOGEN 0.2 05/14/2008 0020   NITRITE NEGATIVE 06/14/2021 1338   LEUKOCYTESUR NEGATIVE 06/14/2021 1338   Recent Results (from the past 240 hour(s))  Urine Culture     Status: Abnormal   Collection Time: 06/14/21  1:28 PM   Specimen: Urine, Clean Catch  Result Value Ref Range Status   Specimen Description URINE, CLEAN CATCH  Final   Special Requests   Final    NONE Performed at [facility], [address], [place], [place] [address]    Culture >=100,000 COLONIES/mL STAPHYLOCOCCUS AUREUS (A)  Final   Report Status 06/16/2021 FINAL  Final   Organism ID, Bacteria STAPHYLOCOCCUS AUREUS (A)  Final      Susceptibility   Staphylococcus aureus - MIC*    CIPROFLOXACIN <=0.5 SENSITIVE Sensitive     GENTAMICIN <=0.5 SENSITIVE Sensitive     NITROFURANTOIN <=16 SENSITIVE Sensitive     OXACILLIN 0.5 SENSITIVE Sensitive     TETRACYCLINE <=1 SENSITIVE Sensitive     VANCOMYCIN <=0.5 SENSITIVE Sensitive     TRIMETH/SULFA <=10 SENSITIVE Sensitive     CLINDAMYCIN <=0.25 SENSITIVE Sensitive     RIFAMPIN <=0.5 SENSITIVE Sensitive     Inducible Clindamycin NEGATIVE Sensitive     * >=100,000 COLONIES/mL  STAPHYLOCOCCUS AUREUS  Culture, blood (routine x 2)     Status: None   Collection Time: 06/14/21  7:40 PM   Specimen: BLOOD  Result Value Ref Range Status   Specimen Description BLOOD RIGHT ANTECUBITAL  Final   Special Requests AEROBIC BOTTLE ONLY Blood Culture adequate volume  Final   Culture   Final    NO GROWTH 5 DAYS Performed at [facility], [address] [place], [place] [address]    Report Status 06/19/2021 FINAL  Final  Culture, blood (routine x 2)     Status: None   Collection Time: 06/14/21  8:13 PM   Specimen: BLOOD RIGHT HAND  Result Value Ref Range Status   Specimen Description BLOOD RIGHT HAND  Final   Special Requests   Final  AEROBIC BOTTLE ONLY Blood Culture results may not be optimal due to an inadequate volume of blood received in culture bottles   Culture   Final    NO GROWTH 5 DAYS Performed at [facility], [address], [place], [place] [address]    Report Status 06/19/2021 FINAL  Final  Surgical pcr screen     Status: Abnormal   Collection Time: 06/16/21 11:08 AM   Specimen: Nasal Mucosa; Nasal Swab  Result Value Ref Range Status   MRSA, PCR NEGATIVE NEGATIVE Final   Staphylococcus aureus POSITIVE (A) NEGATIVE Final    Comment: (NOTE) The Xpert SA Assay (FDA approved for NASAL specimens in patients 61 years of age and older), is one component of a comprehensive surveillance program. It is not intended to diagnose infection nor to guide or monitor treatment. Performed at [facility], [address] [place], [place] [address]   Aerobic/Anaerobic Culture w Gram Stain (surgical/deep wound)     Status: None   Collection Time: 06/16/21 12:42 PM   Specimen: Abscess  Result Value Ref Range Status   Specimen Description ABSCESS  Final   Special Requests EPIDURAL ABSCESS  Final   Gram Stain   Final    FEW WBC PRESENT,BOTH PMN AND MONONUCLEAR FEW GRAM POSITIVE COCCI    Culture   Final    ABUNDANT STAPHYLOCOCCUS AUREUS NO  ANAEROBES ISOLATED Performed at [facility], [address] [place], [place] [address]    Report Status 06/21/2021 FINAL  Final   Organism ID, Bacteria STAPHYLOCOCCUS AUREUS  Final      Susceptibility   Staphylococcus aureus - MIC*    CIPROFLOXACIN <=0.5 SENSITIVE Sensitive     ERYTHROMYCIN <=0.25 SENSITIVE Sensitive     GENTAMICIN <=0.5 SENSITIVE Sensitive     OXACILLIN 0.5 SENSITIVE Sensitive     TETRACYCLINE <=1 SENSITIVE Sensitive     VANCOMYCIN 1 SENSITIVE Sensitive     TRIMETH/SULFA <=10 SENSITIVE Sensitive     CLINDAMYCIN <=0.25 SENSITIVE Sensitive     RIFAMPIN <=0.5 SENSITIVE Sensitive     Inducible Clindamycin NEGATIVE Sensitive     * ABUNDANT STAPHYLOCOCCUS AUREUS      Radiology Studies: DG Abd Portable 1V  Result Date: 06/21/2021 CLINICAL DATA:  Nasogastric tube placement. EXAM: PORTABLE ABDOMEN - 1 VIEW COMPARISON:  None. FINDINGS: The bowel gas pattern is normal. Feeding tube tip is seen in expected position of distal stomach. No radio-opaque calculi or other significant radiographic abnormality are seen. IMPRESSION: Feeding tube tip seen in expected position of distal stomach. Electronically Signed   By: Lupita Raider M.D.   On: 06/21/2021 14:39   DG Abd Portable 1V  Result Date: 06/21/2021 CLINICAL DATA:  Evaluate feeding tube placement EXAM: PORTABLE ABDOMEN - 1 VIEW COMPARISON:  06/17/2021 FINDINGS: The feeding tube tip is 8.2 cm below the GE junction in the expected location of the gastric fundus. Normal bowel gas pattern. IMPRESSION: Feeding tube tip in the gastric fundus. Electronically Signed   By: Signa Kell M.D.   On: 06/21/2021 11:11    Scheduled Meds:  allopurinol  300 mg Per Tube Daily   amLODipine  10 mg Per Tube Daily   aspirin  81 mg Per Tube Daily   Chlorhexidine Gluconate Cloth  6 each Topical Daily   feeding supplement (OSMOLITE 1.2 CAL)  1,000 mL Per Tube Q24H   feeding supplement (PROSource TF)  45 mL Per Tube BID   folic acid  1 mg  Per Tube Daily   free water  115 mL Per Tube Q4H   multivitamin with minerals  1 tablet Per Tube Daily   mupirocin ointment   Nasal BID   rifampin  300 mg Oral Q12H   thiamine injection  100 mg Intravenous Daily   Continuous Infusions:  dextrose 5 % 500 mL with nafcillin 12 g infusion 20.8 mL/hr at 06/21/21 1815   dextrose     thiamine injection 250 mg (06/21/21 1148)     LOS: 11 days   Time spent: 35 minutes.  Azucena FallenWilliam C Treshaun Carrico, DO [facility] Pager: Secure chat  06/22/2021, 7:30 AM

## 2021-06-23 LAB — BASIC METABOLIC PANEL
Anion gap: 8 (ref 5–15)
Anion gap: 8 (ref 5–15)
BUN: 85 mg/dL — ABNORMAL HIGH (ref 8–23)
BUN: 83 mg/dL — ABNORMAL HIGH (ref 8–23)
CO2: 22 mmol/L (ref 22–32)
CO2: 24 mmol/L (ref 22–32)
Calcium: 7.7 mg/dL — ABNORMAL LOW (ref 8.9–10.3)
Calcium: 8 mg/dL — ABNORMAL LOW (ref 8.9–10.3)
Chloride: 121 mmol/L — ABNORMAL HIGH (ref 98–111)
Chloride: 122 mmol/L — ABNORMAL HIGH (ref 98–111)
Creatinine, Ser: 1.95 mg/dL — ABNORMAL HIGH (ref 0.44–1.00)
Creatinine, Ser: 1.94 mg/dL — ABNORMAL HIGH (ref 0.44–1.00)
GFR, Estimated: 26 mL/min — ABNORMAL LOW (ref >60)
GFR, Estimated: 27 mL/min — ABNORMAL LOW (ref >60)
Glucose, Bld: 158 mg/dL — ABNORMAL HIGH (ref 70–99)
Glucose, Bld: 146 mg/dL — ABNORMAL HIGH (ref 70–99)
Potassium: 4.2 mmol/L (ref 3.5–5.1)
Potassium: 3 mmol/L — ABNORMAL LOW (ref 3.5–5.1)
Sodium: 151 mmol/L — ABNORMAL HIGH (ref 135–145)
Sodium: 154 mmol/L — ABNORMAL HIGH (ref 135–145)

## 2021-06-23 LAB — HEPATIC FUNCTION PANEL
ALT: 38 U/L (ref 0–44)
AST: 51 U/L — ABNORMAL HIGH (ref 15–41)
Albumin: <1 g/dL — ABNORMAL LOW (ref 3.5–5.0)
Alkaline Phosphatase: 105 U/L (ref 38–126)
Bilirubin, Direct: 1.3 mg/dL — ABNORMAL HIGH (ref 0.0–0.2)
Indirect Bilirubin: 3.4 mg/dL — ABNORMAL HIGH (ref 0.3–0.9)
Total Bilirubin: 4.7 mg/dL — ABNORMAL HIGH (ref 0.3–1.2)
Total Protein: 4.6 g/dL — ABNORMAL LOW (ref 6.5–8.1)

## 2021-06-23 LAB — GLUCOSE, CAPILLARY
Glucose-Capillary: 134 mg/dL — ABNORMAL HIGH (ref 70–99)
Glucose-Capillary: 131 mg/dL — ABNORMAL HIGH (ref 70–99)
Glucose-Capillary: 103 mg/dL — ABNORMAL HIGH (ref 70–99)

## 2021-06-23 NOTE — Progress Notes (Signed)
PROGRESS NOTE  Kristy [name]  QAS:[id number] DOB: Jun 01, 1946 DOA: 06/10/2021 PCP: Darrow Bussing, MD   Brief Narrative: Kristy [name] is a 75 y.o. female with a history of alcohol use who presented to the ED for frequent falls, generalized weakness, and confusion. She was found to be afebrile, hypertensive, and without hypoxia. Also had hypokalemia (K 2.6), SCr 1.88 (above baseline ~1.5), LFT elevation (AST 576, ALT 305). Ammonia normal. EtOH, salicylate, tylenol levels negative. CT head and cervical spine were reassuring, though the patient was unable to rise to seated position from laying prompting consultation for admission. The patient was given potassium supplementation, started on CIWA protocol, and admitted with note made of continued AMS. She has remained weak and confused, though on 8/5 became less interactive, retaining urine, WBC noted to be rising. Blood and urine cultures ordered, ceftriaxone given with bacteriuria and urinary retention. MR brain has not been done yet. Neurology consulted with worsening mental status and nuchal rigidity. LP under fluoro requested, empirically treating meningitis/encephalitis.   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); Cervical discitis/osteomyelitis; UTI (POA); cannot rule out concurrent meninigitis - Neuro, ID, and Neurosurgery consulted and following - Appreciate insight and recommendations -TTE unremarkable for overt vegetation - ID initially concerned for possible systemic or central etiology given multiple sites of staph infection -S/P I/D of  06/16/21 - noted copious amounts of pus in the longus colli with myositis, discitis, osteomyelitis, large epidural abscess s/p drainage -cultures consistent with gram-positive staph, speciation pending -Continue  nafcillin per infectious disease, previously discontinued all other antibiotics  Acute metabolic encephalopathy: Multifactorial, likely also a chronic component; resolving.  - Neurology consulted - bedside LP unable to be performed - cultures to be obtained as above with abscess drainage - MRI remarkable for osteo/epidural abscess as above - Empiric coverage with vancomycin, ampicillin, ceftriaxone 2g, and acyclovir.  - Patient's mental status continues to improve slowly, able to follow simple commands today clear throat and participate with suctioning - Remains n.p.o., reconsult speech given patient's improved mental status  -Core track feeding ongoing, free water flushes increased given electrolyte abnormalities as below; appreciate dietary insight and recommendations  Hypernatremic/hyperchloremic, improving -Likely secondary to previous IV fluid boluses and concurrent IV medications - transitioned to D5 water as possible -Continues downtrending appropriately with increased free water flushes and D5 water IV fluids  UTI, POA, resolved:  - Covered by above abx - treatment completed  Ambulatory dysfunction with fall at home, severe weakness:  - Secondary to above - PT/OT currently recommending SNF rehabilitation once acute infectious process under better control -    Alcoholic hepatitis:  - Based on AST predominance of LFT elevations. Hepatitis panel nonreactive.  - No indication for prednisolone at this time. Continue trending. - AST/ALT downtrending - bilirubin increased marginally    Alcohol abuse:  - Per family - 2 beers per day of late, etoh level negative in ED.  - Continue CIWA. Monitor closely for withdrawal. May DC in AM if no need for ativan. - Cessation counseling to be repeated once pt more clear-headed. - Empirically supplementing thiamine, folate, MVM.   Hypokalemia: - Supplemented with resolution   Severe protein caloric malnutrition  -Continue tube feeds and free  water flushes via core track NG tube - Albumin continues to downtrend despite  increased tube feeds  HTN: - Continue ARB, and increased norvasc.    AKI on stage IIIb CKD, stabilizing:  -Labile in the setting of poor p.o. intake, IV fluids and initiation of free water flushes-we will titrate accordingly - Monitor renal function closely with initiation of vancomycin, acyclovir. -Creatinine now approaching baseline, urine output improving  HLD: - Hold statin for now.  DVT prophylaxis: Lovenox 30mg  Code Status: Full Family Communication: Updated over the phone, encouraged by patient's improvement, although minimal family is excited to see her making some progress Disposition Plan:  Status is: Inpatient  Remains inpatient appropriate because:Persistent severe electrolyte disturbances, Altered mental status, and Unsafe d/c plan  Dispo: The patient is from: Home              Anticipated d/c is to: SNF              Patient currently is not medically stable to d/c.   Difficult to place patient No  Consults: Neurology Psychiatry  Procedures:  None  Antimicrobials: Nafcillin  Subjective: No acute issues or events overnight, somnolent but arousable, able to speak in one-word sentences today, participated with suctioning review of systems limited but appears to be nonacute other than patient wishing for ice chips or fluids  Objective: Vitals:   06/22/21 1611 06/22/21 2014 06/22/21 2348 06/23/21 0322  BP: 115/64 (!) 119/53 (!) 124/59 120/63  Pulse: 93 87 94 (!) 103  Resp: 18 13 18 19   Temp: 99.7 F (37.6 C) 99.3 F (37.4 C) 99 F (37.2 C) 99.1 F (37.3 C)  TempSrc: Axillary Axillary Oral Axillary  SpO2: 100% 99% 99% 100%  Weight:      Height:        Intake/Output Summary (Last 24 hours) at 06/23/2021 16100722 Last data filed at 06/23/2021 0355 Gross per 24 hour  Intake 1521.42 ml  Output 2800 ml  Net -1278.58 ml    General:  Pleasantly resting in bed, No acute distress.  oriented to person and place only (will answer hospital if asked location) HEENT:  Normocephalic atraumatic.  Sclerae nonicteric, noninjected.  Extraocular movements intact bilaterally. Neck:  Without mass or deformity.  Trachea is midline. Lungs:  Clear to auscultate bilaterally without rhonchi, wheeze, or rales. Heart:  Regular rate and rhythm.  Without murmurs, rubs, or gallops. Abdomen:  Soft, nontender, nondistended.  Without guarding or rebound. Extremities: Without cyanosis, clubbing, edema, or obvious deformity. Vascular:  Dorsalis pedis and posterior tibial pulses palpable bilaterally. Skin:  Warm and dry, no erythema, no ulcerations.   Data Reviewed: I have personally reviewed following labs and imaging studies  CBC: Recent Labs  Lab 06/18/21 0343 06/19/21 0412 06/20/21 0404 06/21/21 1010 06/22/21 0550  WBC 13.3* 14.3* 15.9* 13.3* 10.7*  NEUTROABS  --   --   --  11.8*  --   HGB 12.3 11.8* 10.9* 10.8* 9.4*  HCT 37.0 35.7* 33.1* 34.6* 29.5*  MCV 95.9 99.2 100.6* 103.0* 102.1*  PLT 140* 129* 110* 97* 92*    Basic Metabolic Panel: Recent Labs  Lab 06/21/21 0352 06/21/21 1735 06/22/21 0550 06/22/21 1653 06/23/21 0511  NA 159* 153* 157* 152* 151*  K 3.3* 3.1* 3.3* 2.9* 4.2  CL >130* 125* 124* 122* 121*  CO2 22 21* 24 23 22   GLUCOSE 158* 157* 171* 254* 158*  BUN 100* 94* 89* 87* 85*  CREATININE 2.01* 1.90* 1.83* 1.93* 1.95*  CALCIUM 8.2* 7.8* 8.2* 7.9* 7.7*    GFR: Estimated Creatinine Clearance: 26.6 mL/min (A) (by  C-G formula based on SCr of 1.95 mg/dL (H)). Liver Function Tests: Recent Labs  Lab 06/19/21 0412 06/23/21 0511  AST 168* 51*  ALT 150* 38  ALKPHOS 133* 105  BILITOT 3.0* 4.7*  PROT 5.7* 4.6*  ALBUMIN 1.2* <1.0*    No results for input(s): LIPASE, AMYLASE in the last 168 hours. No results for input(s): AMMONIA in the last 168 hours.  Coagulation Profile: No results for input(s): INR, PROTIME in the last 168 hours.  Cardiac  Enzymes: No results for input(s): CKTOTAL, CKMB, CKMBINDEX, TROPONINI in the last 168 hours.  BNP (last 3 results) No results for input(s): PROBNP in the last 8760 hours. HbA1C: No results for input(s): HGBA1C in the last 72 hours. CBG: Recent Labs  Lab 06/22/21 0650 06/22/21 0730 06/22/21 1136 06/22/21 1610 06/22/21 2017  GLUCAP 157* 149* 138* 224* 138*    Lipid Profile: No results for input(s): CHOL, HDL, LDLCALC, TRIG, CHOLHDL, LDLDIRECT in the last 72 hours. Thyroid Function Tests: No results for input(s): TSH, T4TOTAL, FREET4, T3FREE, THYROIDAB in the last 72 hours.  Anemia Panel: No results for input(s): VITAMINB12, FOLATE, FERRITIN, TIBC, IRON, RETICCTPCT in the last 72 hours.  Urine analysis:    Component Value Date/Time   COLORURINE AMBER (A) 06/14/2021 1338   APPEARANCEUR HAZY (A) 06/14/2021 1338   LABSPEC 1.011 06/14/2021 1338   PHURINE 6.0 06/14/2021 1338   GLUCOSEU NEGATIVE 06/14/2021 1338   HGBUR LARGE (A) 06/14/2021 1338   BILIRUBINUR NEGATIVE 06/14/2021 1338   KETONESUR NEGATIVE 06/14/2021 1338   PROTEINUR NEGATIVE 06/14/2021 1338   UROBILINOGEN 0.2 05/14/2008 0020   NITRITE NEGATIVE 06/14/2021 1338   LEUKOCYTESUR NEGATIVE 06/14/2021 1338   Recent Results (from the past 240 hour(s))  Urine Culture     Status: Abnormal   Collection Time: 06/14/21  1:28 PM   Specimen: Urine, Clean Catch  Result Value Ref Range Status   Specimen Description URINE, CLEAN CATCH  Final   Special Requests   Final    NONE Performed at [facility], [address] [place], [place] [address]    Culture >=100,000 COLONIES/mL STAPHYLOCOCCUS AUREUS (A)  Final   Report Status 06/16/2021 FINAL  Final   Organism ID, Bacteria STAPHYLOCOCCUS AUREUS (A)  Final      Susceptibility   Staphylococcus aureus - MIC*    CIPROFLOXACIN <=0.5 SENSITIVE Sensitive     GENTAMICIN <=0.5 SENSITIVE Sensitive     NITROFURANTOIN <=16 SENSITIVE Sensitive     OXACILLIN 0.5 SENSITIVE  Sensitive     TETRACYCLINE <=1 SENSITIVE Sensitive     VANCOMYCIN <=0.5 SENSITIVE Sensitive     TRIMETH/SULFA <=10 SENSITIVE Sensitive     CLINDAMYCIN <=0.25 SENSITIVE Sensitive     RIFAMPIN <=0.5 SENSITIVE Sensitive     Inducible Clindamycin NEGATIVE Sensitive     * >=100,000 COLONIES/mL STAPHYLOCOCCUS AUREUS  Culture, blood (routine x 2)     Status: None   Collection Time: 06/14/21  7:40 PM   Specimen: BLOOD  Result Value Ref Range Status   Specimen Description BLOOD RIGHT ANTECUBITAL  Final   Special Requests AEROBIC BOTTLE ONLY Blood Culture adequate volume  Final   Culture   Final    NO GROWTH 5 DAYS Performed at [facility], [address] [place], [place] [address]    Report Status 06/19/2021 FINAL  Final  Culture, blood (routine x 2)     Status: None   Collection Time: 06/14/21  8:13 PM   Specimen: BLOOD RIGHT HAND  Result Value Ref Range Status   Specimen Description BLOOD RIGHT HAND  Final   Special Requests   Final    AEROBIC BOTTLE ONLY Blood Culture results may not be optimal due to an inadequate volume of blood received in culture bottles   Culture   Final    NO GROWTH 5 DAYS Performed at [facility], [address] [place], [place] [address]    Report Status 06/19/2021 FINAL  Final  Surgical pcr screen     Status: Abnormal   Collection Time: 06/16/21 11:08 AM   Specimen: Nasal Mucosa; Nasal Swab  Result Value Ref Range Status   MRSA, PCR NEGATIVE NEGATIVE Final   Staphylococcus aureus POSITIVE (A) NEGATIVE Final    Comment: (NOTE) The Xpert SA Assay (FDA approved for NASAL specimens in patients 43 years of age and older), is one component of a comprehensive surveillance program. It is not intended to diagnose infection nor to guide or monitor treatment. Performed at [facility], [address] [place], [place] [address]   Aerobic/Anaerobic Culture w Gram Stain (surgical/deep wound)     Status: None   Collection Time: 06/16/21 12:42  PM   Specimen: Abscess  Result Value Ref Range Status   Specimen Description ABSCESS  Final   Special Requests EPIDURAL ABSCESS  Final   Gram Stain   Final    FEW WBC PRESENT,BOTH PMN AND MONONUCLEAR FEW GRAM POSITIVE COCCI    Culture   Final    ABUNDANT STAPHYLOCOCCUS AUREUS NO ANAEROBES ISOLATED Performed at [facility], [address] [place], [place] [address]    Report Status 06/21/2021 FINAL  Final   Organism ID, Bacteria STAPHYLOCOCCUS AUREUS  Final      Susceptibility   Staphylococcus aureus - MIC*    CIPROFLOXACIN <=0.5 SENSITIVE Sensitive     ERYTHROMYCIN <=0.25 SENSITIVE Sensitive     GENTAMICIN <=0.5 SENSITIVE Sensitive     OXACILLIN 0.5 SENSITIVE Sensitive     TETRACYCLINE <=1 SENSITIVE Sensitive     VANCOMYCIN 1 SENSITIVE Sensitive     TRIMETH/SULFA <=10 SENSITIVE Sensitive     CLINDAMYCIN <=0.25 SENSITIVE Sensitive     RIFAMPIN <=0.5 SENSITIVE Sensitive     Inducible Clindamycin NEGATIVE Sensitive     * ABUNDANT STAPHYLOCOCCUS AUREUS      Radiology Studies: DG Abd Portable 1V  Result Date: 06/21/2021 CLINICAL DATA:  Nasogastric tube placement. EXAM: PORTABLE ABDOMEN - 1 VIEW COMPARISON:  None. FINDINGS: The bowel gas pattern is normal. Feeding tube tip is seen in expected position of distal stomach. No radio-opaque calculi or other significant radiographic abnormality are seen. IMPRESSION: Feeding tube tip seen in expected position of distal stomach. Electronically Signed   By: Lupita Raider M.D.   On: 06/21/2021 14:39   DG Abd Portable 1V  Result Date: 06/21/2021 CLINICAL DATA:  Evaluate feeding tube placement EXAM: PORTABLE ABDOMEN - 1 VIEW COMPARISON:  06/17/2021 FINDINGS: The feeding tube tip is 8.2 cm below the GE junction in the expected location of the gastric fundus. Normal bowel gas pattern. IMPRESSION: Feeding tube tip in the gastric fundus. Electronically Signed   By: Signa Kell M.D.   On: 06/21/2021 11:11    Scheduled Meds:   allopurinol  300 mg Per Tube Daily   amLODipine  10 mg Per Tube Daily   aspirin  81 mg Per Tube Daily   Chlorhexidine Gluconate Cloth  6 each Topical Daily   feeding supplement (OSMOLITE 1.2 CAL)  1,000 mL Per Tube Q24H   feeding supplement (PROSource TF)  45 mL Per Tube BID   folic acid  1 mg Per Tube Daily   free water  115 mL Per Tube Q4H   multivitamin with minerals  1 tablet Per Tube Daily   rifampin  300 mg Oral Q12H   thiamine injection  100 mg Intravenous Daily   Continuous Infusions:  dextrose     Nafcillin 12g infusion in dextrose 5% 500 mL 20.8 mL/hr at 06/22/21 1804     LOS: 12 days   Time spent: 35 minutes.  Azucena Fallen, DO [facility] Pager: Secure chat  06/23/2021, 7:22 AM

## 2021-06-23 NOTE — Plan of Care (Signed)
  Problem: Education: Goal: Knowledge of General Education information will improve Description: Including pain rating scale, medication(s)/side effects and non-pharmacologic comfort measures 06/23/2021 0605 by Carloyn Manner I, LPN Outcome: Not Progressing 06/23/2021 0604 by Carloyn Manner I, LPN Outcome: Not Progressing   Problem: Health Behavior/Discharge Planning: Goal: Ability to manage health-related needs will improve 06/23/2021 0605 by Carloyn Manner I, LPN Outcome: Not Progressing 06/23/2021 0604 by Carloyn Manner I, LPN Outcome: Not Progressing

## 2021-06-23 NOTE — Plan of Care (Signed)

## 2021-06-24 ENCOUNTER — Inpatient Hospital Stay (HOSPITAL_COMMUNITY): Payer: Medicare Other

## 2021-06-24 DIAGNOSIS — G061 Intraspinal abscess and granuloma: Secondary | ICD-10-CM

## 2021-06-24 LAB — GLUCOSE, CAPILLARY
Glucose-Capillary: 108 mg/dL — ABNORMAL HIGH (ref 70–99)
Glucose-Capillary: 112 mg/dL — ABNORMAL HIGH (ref 70–99)
Glucose-Capillary: 120 mg/dL — ABNORMAL HIGH (ref 70–99)
Glucose-Capillary: 128 mg/dL — ABNORMAL HIGH (ref 70–99)
Glucose-Capillary: 132 mg/dL — ABNORMAL HIGH (ref 70–99)

## 2021-06-24 LAB — CBC
HCT: 30.7 % — ABNORMAL LOW (ref 36.0–46.0)
Hemoglobin: 9.8 g/dL — ABNORMAL LOW (ref 12.0–15.0)
MCH: 32.8 pg (ref 26.0–34.0)
MCHC: 31.9 g/dL (ref 30.0–36.0)
MCV: 102.7 fL — ABNORMAL HIGH (ref 80.0–100.0)
Platelets: 105 10*3/uL — ABNORMAL LOW (ref 150–400)
RBC: 2.99 MIL/uL — ABNORMAL LOW (ref 3.87–5.11)
RDW: 15.2 % (ref 11.5–15.5)
WBC: 9.9 10*3/uL (ref 4.0–10.5)
nRBC: 0 % (ref 0.0–0.2)

## 2021-06-24 MED ORDER — OSMOLITE 1.2 CAL PO LIQD
1000.0000 mL | ORAL | Status: DC
  Administered 2021-06-24 – 2021-06-26 (×4): 1000 mL
  Filled 2021-06-24 (×5): qty 1000

## 2021-06-24 MED ORDER — FREE WATER
150.0000 mL | Status: DC
  Administered 2021-06-24 – 2021-06-26 (×10): 150 mL

## 2021-06-24 NOTE — Progress Notes (Signed)
Modified Barium Swallow Progress Note  Patient Details  Name: Kristy [name] MRN: [id number] Date of Birth: September 08, 1946  Today's Date: 06/24/2021  Modified Barium Swallow completed.  Full report located under Chart Review in the Imaging Section.  Brief recommendations include the following:  Clinical Impression  Kristy [name] presents with a moderate dysphagia, impacted by both mechanical and cognitive components.  She was intermittently lethargic throughout the assessment.  Initially, she held her neck in deep flexion, which appeared to offer some protection when swallowing thin liquids - there was no penetration nor aspiration.  Later in the study, when sleepier and when head was in extension and neutral, thin liquids were immediately aspirated with an ensuing cough response. Purees sat in pharynx and pt was unable to move them due to poor wakefulness at that point in the study. Study was terminated and oral suctioning was provided.  Recommend continuing intensive oral care; offer ice chips after oral care.  Maintain NPO status for now.  SLP will follow for therapeutic PO trials - as mentation improves we will have a clearer understanding of potential impact of ACDF/prevertebral swelling on swallowing.   Swallow Evaluation Recommendations       SLP Diet Recommendations: NPO     Allow ice chips after oral care                  Oral Care Recommendations: Oral care QID       Kristy [name] L. Samson Frederic, MA CCC/SLP Acute Rehabilitation Services Office number [phone] Pager [phone]  Blenda [name] Kristy 06/24/2021,3:10 PM

## 2021-06-24 NOTE — Progress Notes (Signed)
Nutrition Follow-up  DOCUMENTATION CODES:  Non-severe (moderate) malnutrition in context of chronic illness  INTERVENTION:  Continue TF via Cortrak: -Osmolite 1.2 cal @ 23m/hr (15653md)  -4571mrosource TF BID -150m97mee water flush Q4H  Provides 1952 kcals, 108 grams protein, 1279ml97me water (2179ml 32ml free water with flushes)  NUTRITION DIAGNOSIS:  Moderate Malnutrition related to chronic illness as evidenced by moderate muscle depletion, moderate fat depletion, mild muscle depletion, mild fat depletion. -- ongoing  GOAL:  Patient will meet greater than or equal to 90% of their needs -- met with TF  MONITOR:  TF tolerance, Labs, I & O's  REASON FOR ASSESSMENT:  Consult Enteral/tube feeding initiation and management  ASSESSMENT:  Pt with a PMH significant for h/o EtOH abuse who presented to the ED with frequent falls, generalized weakness, and confusion, and was admitted with acute delirium and severe sepsis 2/2 cervical epidural abscess (C2-T3/T4); cervical discitis/osteomyelitis; UTI. Pt has since become less interactive and more confused and has started retaining urine. Neurology was consulted with worsening mental status and nuchal rigidity. LP under fluoro requested, empirically treating meningitis/encephalitis.  8/06-s/p unsuccessful LP 8/07-s/p C5-C6 anterior cervical discectomy and instrumented fusion  8/08-Cortrak placed 8/12-Cortrak replaced (gastric tip per xray)  Pt's mental status is slowly improving, now able to follow simple commands. pt remains NPO with TF via Cortrak. SLP following. Current TF: osmolite 1.2 @ 65ml/h67m5ml pr73mrce TF BID, 115ml fre90mter Q4H  Medications: folvite, mvi with minerals, thiamine, IV abx Labs: Na 154 (H), K+ 3.0 (L), Cr 1.94 (H) CBGs [phone] x24 74ms I/O: +6L since admit   Diet Order:   Diet Order             Diet NPO time specified  Diet effective now                  EDUCATION NEEDS:   Not appropriate for education at this time  Skin:  Skin Assessment: Skin Integrity Issues: Skin Integrity Issues:: Incisions Incisions: neck  Last BM:  8/11  Height:  Ht Readings from Last 1 Encounters:  06/14/21 5' 6" (1.676 m)   Weight:  Wt Readings from Last 1 Encounters:  06/24/21 78.6 kg   BMI:  Body mass index is 27.97 kg/m.  Estimated Nutritional Needs:  Kcal:  1900-2100 Protein:  100-120 grams Fluid:  >1.9L/d  Kiearra Oyervides AveLarkin InaLDN (she/her/hers) RD pager number and weekend/on-call pager number located in [email or web address]

## 2021-06-24 NOTE — Progress Notes (Signed)
[facility] for Infectious Disease  Date of Admission:  06/10/2021      Total days of antibiotics 13   Nafcillin            ASSESSMENT: Kristy [name] is a 75 y.o. 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 Not bacteremic. Mental status seems improved in that she can hold conversation just a little disoriented for the fine details.  Continue plans for Nafcillin treatment through 8/21. Change to cefazolin 8/22 - 10/02 for 8 week treatment course. Rifampin added - LFTs are improving. Should be monitored weekly while on treatment. Creatinine is doing well.   WIll sign off and prepare for outpatient care. Please call back with any questions or changes to her condition.    PLAN: Opat as outlined below   OPAT ORDERS:  Diagnosis: cervical spine epidural abscess s/p surgery with fusion 8/7  Culture Result: MSSA  No Known Allergies   Discharge antibiotics to be given via PICC line:  Staged ABX Plan:   [place] dosed 8/07 - 06/30/21   THEN  Cefazolin pharmacy dosed 8/22 - 08/11/2021  Rifampin 600 mg BID to be continued throughout the treatment as she can tolerate     Duration: 8weeks   End Date: 8/21 for nafcillin  10/02 for cefazolin   [facility] Care Per Protocol with [employer] Use: Home health RN for IV administration and teaching, line care and labs.    Labs weekly while on IV antibiotics: _x_ CBC with differential __ BMP _x_ CMP _x_ CRP _x_ ESR __ Vancomycin trough __ CK  __ Please pull PIC at completion of IV antibiotics _x_ Please leave PIC in place until doctor has seen patient or been notified  Fax weekly labs to [phone]  Clinic Follow Up Appt: 9/13 @ 9:30 am with Dr. West Bali  Principal Problem:   Abscess in epidural space of cervical spine Active Problems:   Fall   Alcoholic hepatitis   Altered mental status   Acute delirium   Sepsis  ([place])   Malnutrition of moderate degree    allopurinol  300 mg Per Tube Daily   amLODipine  10 mg Per Tube Daily   aspirin  81 mg Per Tube Daily   Chlorhexidine Gluconate Cloth  6 each Topical Daily   feeding supplement (PROSource TF)  45 mL Per Tube BID   folic acid  1 mg Per Tube Daily   free water  150 mL Per Tube Q4H   multivitamin with minerals  1 tablet Per Tube Daily   rifampin  300 mg Oral Q12H   thiamine injection  100 mg Intravenous Daily    SUBJECTIVE: She believes it is 1956 and wants a nap.    Review of Systems: Review of Systems  Unable to perform ROS: Mental status change   No Known Allergies  OBJECTIVE: Vitals:   06/23/21 2200 06/24/21 0000 06/24/21 0200 06/24/21 0400  BP: 125/68 (!) 122/59    Pulse: 87 93    Resp: 12 14    Temp:   98.7 F (37.1 C) 98.5 F (36.9 C)  TempSrc:   Oral Axillary  SpO2: 99% 99%    Weight:    78.6 kg  Height:       Body mass index is 27.97 kg/m.  Physical Exam Vitals reviewed.  Constitutional:      Appearance: She is well-developed.  Comments: Resting in bed in no distress. Moaning a bit.   HENT:     Mouth/Throat:     Mouth: No oral lesions.     Dentition: Normal dentition. No dental abscesses.     Pharynx: No oropharyngeal exudate.  Neck:     Comments: Anterior neck incision well healed  Cardiovascular:     Rate and Rhythm: Normal rate and regular rhythm.     Heart sounds: Normal heart sounds.  Pulmonary:     Effort: Pulmonary effort is normal.     Breath sounds: Normal breath sounds.  Abdominal:     General: There is no distension.     Palpations: Abdomen is soft.     Tenderness: There is no abdominal tenderness.  Lymphadenopathy:     Cervical: No cervical adenopathy.  Skin:    General: Skin is warm and dry.     Capillary Refill: Capillary refill takes less than 2 seconds.     Findings: No rash.  Neurological:     Mental Status: She is alert and oriented to person, place, and time.  Psychiatric:         Behavior: Behavior normal.        Judgment: Judgment normal.     Comments: Pleasantly confused    Lab Results Lab Results  Component Value Date   WBC 9.9 06/24/2021   HGB 9.8 (L) 06/24/2021   HCT 30.7 (L) 06/24/2021   MCV 102.7 (H) 06/24/2021   PLT 105 (L) 06/24/2021    Lab Results  Component Value Date   CREATININE 1.94 (H) 06/23/2021   BUN 83 (H) 06/23/2021   NA 154 (H) 06/23/2021   K 3.0 (L) 06/23/2021   CL 122 (H) 06/23/2021   CO2 24 06/23/2021    Lab Results  Component Value Date   ALT 38 06/23/2021   AST 51 (H) 06/23/2021   ALKPHOS 105 06/23/2021   BILITOT 4.7 (H) 06/23/2021     Microbiology: Recent Results (from the past 240 hour(s))  Culture, blood (routine x 2)     Status: None   Collection Time: 06/14/21  7:40 PM   Specimen: BLOOD  Result Value Ref Range Status   Specimen Description BLOOD RIGHT ANTECUBITAL  Final   Special Requests AEROBIC BOTTLE ONLY Blood Culture adequate volume  Final   Culture   Final    NO GROWTH 5 DAYS Performed at [facility], [place] [address], [place], [place] [address]    Report Status 06/19/2021 FINAL  Final  Culture, blood (routine x 2)     Status: None   Collection Time: 06/14/21  8:13 PM   Specimen: BLOOD RIGHT HAND  Result Value Ref Range Status   Specimen Description BLOOD RIGHT HAND  Final   Special Requests   Final    AEROBIC BOTTLE ONLY Blood Culture results may not be optimal due to an inadequate volume of blood received in culture bottles   Culture   Final    NO GROWTH 5 DAYS Performed at [facility], [place] [address] [place], [place] [address]    Report Status 06/19/2021 FINAL  Final  Surgical pcr screen     Status: Abnormal   Collection Time: 06/16/21 11:08 AM   Specimen: Nasal Mucosa; Nasal Swab  Result Value Ref Range Status   MRSA, PCR NEGATIVE NEGATIVE Final   Staphylococcus aureus POSITIVE (A) NEGATIVE Final    Comment: (NOTE) The Xpert SA Assay (FDA approved for NASAL specimens in  patients  2 years of age and older), is one component of a comprehensive surveillance program. It is not intended to diagnose infection nor to guide or monitor treatment. Performed at [facility], [place] [address], [place], [place] [address]   Aerobic/Anaerobic Culture w Gram Stain (surgical/deep wound)     Status: None   Collection Time: 06/16/21 12:42 PM   Specimen: Abscess  Result Value Ref Range Status   Specimen Description ABSCESS  Final   Special Requests EPIDURAL ABSCESS  Final   Gram Stain   Final    FEW WBC PRESENT,BOTH PMN AND MONONUCLEAR FEW GRAM POSITIVE COCCI    Culture   Final    ABUNDANT STAPHYLOCOCCUS AUREUS NO ANAEROBES ISOLATED Performed at [facility], [place] [address], [place],  [address]    Report Status 06/21/2021 FINAL  Final   Organism ID, Bacteria STAPHYLOCOCCUS AUREUS  Final      Susceptibility   Staphylococcus aureus - MIC*    CIPROFLOXACIN <=0.5 SENSITIVE Sensitive     ERYTHROMYCIN <=0.25 SENSITIVE Sensitive     GENTAMICIN <=0.5 SENSITIVE Sensitive     OXACILLIN 0.5 SENSITIVE Sensitive     TETRACYCLINE <=1 SENSITIVE Sensitive     VANCOMYCIN 1 SENSITIVE Sensitive     TRIMETH/SULFA <=10 SENSITIVE Sensitive     CLINDAMYCIN <=0.25 SENSITIVE Sensitive     RIFAMPIN <=0.5 SENSITIVE Sensitive     Inducible Clindamycin NEGATIVE Sensitive     * ABUNDANT STAPHYLOCOCCUS AUREUS     Janene Madeira, MSN, NP-C [facility] [email or web address] Pager: [phone] Office: [place]: [phone]

## 2021-06-24 NOTE — Progress Notes (Signed)
Physical Therapy Treatment Patient Details Name: Kristy [name] MRN: [id number] DOB: May 24, 1946 Today's Date: 06/24/2021    History of Present Illness Pt is a 75yo female presenting [facility] ED on 8/1 after a fall on 7/29. Pt also fell on 8/2 while in the ED. Pt found to be hypokalemic upon admission. 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 Pt is now s/p drainage of abscess and C5-6 ACDF on 06/16/21. Also noted R SI septic arthritis.  PMH: HTN, polycthemia, alcohol use.    PT Comments    Pt admitted with above diagnosis. Pt continues to be limited by pain.  Only tolerates some exercise and for bed to be placed in chair position.  Pt met 0/6 goals. Revised goals today and decr frequency to appropriate freq of 2 x week given that pt is to go to SNF and has made little progress. Pt currently with functional limitations due to balance and endurance deficits. Pt will benefit from skilled PT to increase their independence and safety with mobility to allow discharge to the venue listed below.      Follow Up Recommendations  SNF;Supervision/Assistance - 24 hour     Equipment Recommendations  Other (comment) (further assessment post acute)    Recommendations for Other Services       Precautions / Restrictions Precautions Precautions: Fall;Cervical;Other (comment) (coretrack) Precaution Booklet Issued: No Precaution Comments: pain Restrictions Weight Bearing Restrictions: No    Mobility  Bed Mobility Overal bed mobility: Needs Assistance Bed Mobility: Rolling Rolling: Total assist;+2 for physical assistance   Supine to sit: Total assist Sit to supine: Total assist   General bed mobility comments: total A; used chair position to attempt to mobilize; [facility] left @ 50 degrees    Transfers                 General transfer comment: unable  at this time  Ambulation/Gait                 Stairs              Wheelchair Mobility    Modified Rankin (Stroke Patients Only)       Balance Overall balance assessment: Needs assistance Sitting-balance support: Feet supported;Bilateral upper extremity supported Sitting balance-Leahy Scale: Poor Sitting balance - Comments: unable                                    Cognition Arousal/Alertness: Lethargic Behavior During Therapy: Flat affect Overall Cognitive Status: Impaired/Different from baseline Area of Impairment: Orientation;Attention;Memory;Following commands;Safety/judgement;Awareness;Problem solving                 Orientation Level: Disoriented to;Place;Time;Situation Current Attention Level: Focused Memory: Decreased recall of precautions;Decreased short-term memory Following Commands: Follows one step commands inconsistently;Follows one step commands with increased time Safety/Judgement: Decreased awareness of safety;Decreased awareness of deficits Awareness: Intellectual Problem Solving: Slow processing;Decreased initiation;Difficulty sequencing;Requires tactile cues;Requires verbal cues General Comments: Pt lethargic and only occasionally opening eyes.  Pt not able to follow commands.   Pt with frequent moaning with any slight movment but when still resting comfortably      Exercises General Exercises - Upper Extremity Shoulder Flexion: AAROM;Both;Supine Elbow Flexion: AAROM;Both;Supine Elbow Extension: AAROM;Both;Supine General Exercises - Lower Extremity Ankle Circles/Pumps: Both;10 reps;Supine;AROM Quad Sets: AROM;Both;5 reps;Supine Heel Slides: PROM;10 reps;Both;Supine (Pt with slight assistance on R for 1-2 reps) Other Exercises  Other Exercises: PROM BUE within pain tolerance Other Exercises: head positioned to reduce cervical extension with head in good posiiton in midline    General Comments General comments (skin integrity, edema, etc.): Bil Prevalon boots in place       Pertinent Vitals/Pain Pain Assessment: Faces Faces Pain Scale: Hurts worst Pain Location: moaning/crying with any movement Pain Descriptors / Indicators: Crying;Grimacing;Moaning Pain Intervention(s): Limited activity within patient's tolerance;Monitored during session;Repositioned    Home Living                      Prior Function            PT Goals (current goals can now be found in the care plan section) Acute Rehab PT Goals Patient Stated Goal: per family for pt to get better PT Goal Formulation: With patient Time For Goal Achievement: 07/08/21 Potential to Achieve Goals: Poor Progress towards PT goals: Not progressing toward goals - comment (limited by pain)    Frequency    Min 2X/week      PT Plan Frequency needs to be updated    Co-evaluation              AM-PAC PT "6 Clicks" Mobility   Outcome Measure  Help needed turning from your back to your side while in a flat bed without using bedrails?: Total Help needed moving from lying on your back to sitting on the side of a flat bed without using bedrails?: Total Help needed moving to and from a bed to a chair (including a wheelchair)?: Total Help needed standing up from a chair using your arms (e.g., wheelchair or bedside chair)?: Total Help needed to walk in hospital room?: Total Help needed climbing 3-5 steps with a railing? : Total 6 Click Score: 6    End of Session   Activity Tolerance: Patient limited by pain Patient left: in bed;with call bell/phone within reach;with bed alarm set Nurse Communication: Mobility status;Need for lift equipment PT Visit Diagnosis: History of falling (Z91.81);Difficulty in walking, not elsewhere classified (R26.2);Muscle weakness (generalized) (M62.81);Other abnormalities of gait and mobility (R26.89)     Time: 1121-1140 PT Time Calculation (min) (ACUTE ONLY): 19 min  Charges:  $Therapeutic Exercise: 8-22 mins                     Makela Niehoff M,PT Acute  Rehab Services [phone] (pager)    Alvira Philips 06/24/2021, 1:49 PM

## 2021-06-24 NOTE — Progress Notes (Signed)
PROGRESS NOTE  Kristy [name]  DZH:[id number] DOB: Oct 25, 1946 DOA: 06/10/2021 PCP: Darrow Bussing, MD   Brief Narrative: Kristy [name] is a 75 y.o. female with a history of alcohol use who presented to the ED for frequent falls, generalized weakness, and confusion. She was found to be afebrile, hypertensive, and without hypoxia. Also had hypokalemia (K 2.6), SCr 1.88 (above baseline ~1.5), LFT elevation (AST 576, ALT 305). Ammonia normal. EtOH, salicylate, tylenol levels negative. CT head and cervical spine were reassuring, though the patient was unable to rise to seated position from laying prompting consultation for admission. The patient was given potassium supplementation, started on CIWA protocol, and admitted with note made of continued AMS. She has remained weak and confused, though on 8/5 became less interactive, retaining urine, WBC noted to be rising. Blood and urine cultures ordered, ceftriaxone given with bacteriuria and urinary retention. MR brain has not been done yet. Neurology consulted with worsening mental status and nuchal rigidity. LP under fluoro requested, empirically treating meningitis/encephalitis.   Assessment & Plan: Principal Problem:   Acute delirium Active Problems:   Fall   Alcoholic hepatitis   Altered mental status   Sepsis (HCC)   Malnutrition of moderate degree  Severe sepsis secondary to staph aureus infection including: Cervical epidural abscess (C2-T3/4);  Cervical discitis/osteomyelitis;  UTI (POA);  Cannot rule out concurrent meninigitis - Neuro, ID, and Neurosurgery consulted - Appreciate insight and recommendations -TTE unremarkable for overt vegetation - ID initially concerned for possible systemic or central etiology given multiple sites of staph infection -S/P I/D of  06/16/21 - noted copious amounts of pus in the longus colli with myositis, discitis, osteomyelitis, large epidural abscess s/p drainage - Staph Aureus per cultures -Continue nafcillin per  infectious disease, previously discontinued all other antibiotics  Acute metabolic encephalopathy: Multifactorial, likely also a chronic component; resolving.  - Neurology consulted - bedside LP unable to be performed - cultures to be obtained as above with abscess drainage - MRI remarkable for osteo/epidural abscess as above - Empiric coverage downgraded to Nafcillin per ID - Patient's mental status continues to improve slowly, able to follow commands and speak in short sentences - Remains n.p.o., reconsult speech given patient's improved mental status -Core track feeding ongoing, free water flushes increased given electrolyte abnormalities as below; appreciate dietary insight and recommendations  Hypernatremic/hyperchloremic, improving -Likely secondary to previous IV fluid boluses and concurrent IV medications - transitioned to D5 water as possible -Continues downtrending appropriately with increased free water flushes and D5 water IV fluids  UTI, POA, resolved:  - Covered by above abx - treatment completed  Ambulatory dysfunction with fall at home, severe weakness:  - Secondary to above - PT/OT currently recommending SNF rehabilitation once acute infectious process under better control -    Alcoholic hepatitis:  - Based on AST predominance of LFT elevations. Hepatitis panel nonreactive.  - No indication for prednisolone at this time. Continue trending. - AST/ALT downtrending - bilirubin increased marginally    Alcohol abuse:  - Per family - 2 beers per day of late, etoh level negative in ED.  - Continue CIWA. Monitor closely for withdrawal. May DC in AM if no need for ativan. - Cessation counseling to be repeated once pt more clear-headed. - Empirically supplementing thiamine, folate, MVM.   Hypokalemia: - Supplemented with resolution   Severe protein caloric malnutrition  -Continue tube feeds and free water flushes via core track NG tube - Albumin continues to downtrend  despite increased tube feeds  HTN: - Continue ARB, and increased norvasc.    AKI on stage IIIb CKD, stabilizing:  -Labile in the setting of poor p.o. intake, IV fluids and initiation of free water flushes-we will titrate accordingly - Monitor renal function closely with initiation of vancomycin, acyclovir. -Creatinine now approaching baseline, urine output improving  HLD: - Hold statin for now.  DVT prophylaxis: Lovenox 30mg  Code Status: Full Family Communication: Updated over the phone/at bedside - Family updated on prognosis/goals of care discussion. Family wants to bring the patient home if at all possible - we discussed she has a long road ahead of her including antibiotics and physical therapy. Unclear if she will progress enough in the next week to DC home.  Disposition Plan:  Status is: Inpatient  Remains inpatient appropriate because:Persistent severe electrolyte disturbances, Altered mental status, and Unsafe d/c plan  Dispo: The patient is from: Home              Anticipated d/c is to: SNF              Patient currently is not medically stable to d/c.   Difficult to place patient No  Consults: Neurology Psychiatry  Procedures:  None  Antimicrobials: Nafcillin  Subjective: No acute issues or events overnight, awake and alert today for the first time.Interacting with family over the phone and at bedside.  Objective: Vitals:   06/23/21 2200 06/24/21 0000 06/24/21 0200 06/24/21 0400  BP: 125/68 (!) 122/59    Pulse: 87 93    Resp: 12 14    Temp:   98.7 F (37.1 C) 98.5 F (36.9 C)  TempSrc:   Oral Axillary  SpO2: 99% 99%    Weight:    78.6 kg  Height:        Intake/Output Summary (Last 24 hours) at 06/24/2021 0734 Last data filed at 06/24/2021 06/26/2021 Gross per 24 hour  Intake 3857.06 ml  Output 400 ml  Net 3457.06 ml    General:  Pleasantly resting in bed, No acute distress. Aox4 - more appropriately interacting today HEENT:  Normocephalic atraumatic.   Sclerae nonicteric, noninjected.  Extraocular movements intact bilaterally. Neck:  Without mass or deformity.  Trachea is midline. Lungs:  Clear to auscultate bilaterally without rhonchi, wheeze, or rales. Heart:  Regular rate and rhythm.  Without murmurs, rubs, or gallops. Abdomen:  Soft, nontender, nondistended.  Without guarding or rebound. Extremities: Without cyanosis, clubbing, edema, or obvious deformity. Vascular:  Dorsalis pedis and posterior tibial pulses palpable bilaterally. Skin:  Warm and dry, no erythema, no ulcerations.  Data Reviewed: I have personally reviewed following labs and imaging studies  CBC: Recent Labs  Lab 06/18/21 0343 06/19/21 0412 06/20/21 0404 06/21/21 1010 06/22/21 0550  WBC 13.3* 14.3* 15.9* 13.3* 10.7*  NEUTROABS  --   --   --  11.8*  --   HGB 12.3 11.8* 10.9* 10.8* 9.4*  HCT 37.0 35.7* 33.1* 34.6* 29.5*  MCV 95.9 99.2 100.6* 103.0* 102.1*  PLT 140* 129* 110* 97* 92*    Basic Metabolic Panel: Recent Labs  Lab 06/21/21 1735 06/22/21 0550 06/22/21 1653 06/23/21 0511 06/23/21 1622  NA 153* 157* 152* 151* 154*  K 3.1* 3.3* 2.9* 4.2 3.0*  CL 125* 124* 122* 121* 122*  CO2 21* 24 23 22 24   GLUCOSE 157* 171* 254* 158* 146*  BUN 94* 89* 87* 85* 83*  CREATININE 1.90* 1.83* 1.93* 1.95* 1.94*  CALCIUM 7.8* 8.2* 7.9* 7.7* 8.0*    GFR: Estimated Creatinine Clearance: 26.5 mL/min (  A) (by C-G formula based on SCr of 1.94 mg/dL (H)). Liver Function Tests: Recent Labs  Lab 06/19/21 0412 06/23/21 0511  AST 168* 51*  ALT 150* 38  ALKPHOS 133* 105  BILITOT 3.0* 4.7*  PROT 5.7* 4.6*  ALBUMIN 1.2* <1.0*    No results for input(s): LIPASE, AMYLASE in the last 168 hours. No results for input(s): AMMONIA in the last 168 hours.  Coagulation Profile: No results for input(s): INR, PROTIME in the last 168 hours.  Cardiac Enzymes: No results for input(s): CKTOTAL, CKMB, CKMBINDEX, TROPONINI in the last 168 hours.  BNP (last 3 results) No  results for input(s): PROBNP in the last 8760 hours. HbA1C: No results for input(s): HGBA1C in the last 72 hours. CBG: Recent Labs  Lab 06/23/21 1147 06/23/21 1615 06/23/21 2209 06/24/21 0021 06/24/21 0422  GLUCAP 134* 131* 103* 112* 128*    Lipid Profile: No results for input(s): CHOL, HDL, LDLCALC, TRIG, CHOLHDL, LDLDIRECT in the last 72 hours. Thyroid Function Tests: No results for input(s): TSH, T4TOTAL, FREET4, T3FREE, THYROIDAB in the last 72 hours.  Anemia Panel: No results for input(s): VITAMINB12, FOLATE, FERRITIN, TIBC, IRON, RETICCTPCT in the last 72 hours.  Urine analysis:    Component Value Date/Time   COLORURINE AMBER (A) 06/14/2021 1338   APPEARANCEUR HAZY (A) 06/14/2021 1338   LABSPEC 1.011 06/14/2021 1338   PHURINE 6.0 06/14/2021 1338   GLUCOSEU NEGATIVE 06/14/2021 1338   HGBUR LARGE (A) 06/14/2021 1338   BILIRUBINUR NEGATIVE 06/14/2021 1338   KETONESUR NEGATIVE 06/14/2021 1338   PROTEINUR NEGATIVE 06/14/2021 1338   UROBILINOGEN 0.2 05/14/2008 0020   NITRITE NEGATIVE 06/14/2021 1338   LEUKOCYTESUR NEGATIVE 06/14/2021 1338   Recent Results (from the past 240 hour(s))  Urine Culture     Status: Abnormal   Collection Time: 06/14/21  1:28 PM   Specimen: Urine, Clean Catch  Result Value Ref Range Status   Specimen Description URINE, CLEAN CATCH  Final   Special Requests   Final    NONE Performed at [facility], [address], [place], [place] [address]    Culture >=100,000 COLONIES/mL STAPHYLOCOCCUS AUREUS (A)  Final   Report Status 06/16/2021 FINAL  Final   Organism ID, Bacteria STAPHYLOCOCCUS AUREUS (A)  Final      Susceptibility   Staphylococcus aureus - MIC*    CIPROFLOXACIN <=0.5 SENSITIVE Sensitive     GENTAMICIN <=0.5 SENSITIVE Sensitive     NITROFURANTOIN <=16 SENSITIVE Sensitive     OXACILLIN 0.5 SENSITIVE Sensitive     TETRACYCLINE <=1 SENSITIVE Sensitive     VANCOMYCIN <=0.5 SENSITIVE Sensitive     TRIMETH/SULFA <=10 SENSITIVE  Sensitive     CLINDAMYCIN <=0.25 SENSITIVE Sensitive     RIFAMPIN <=0.5 SENSITIVE Sensitive     Inducible Clindamycin NEGATIVE Sensitive     * >=100,000 COLONIES/mL STAPHYLOCOCCUS AUREUS  Culture, blood (routine x 2)     Status: None   Collection Time: 06/14/21  7:40 PM   Specimen: BLOOD  Result Value Ref Range Status   Specimen Description BLOOD RIGHT ANTECUBITAL  Final   Special Requests AEROBIC BOTTLE ONLY Blood Culture adequate volume  Final   Culture   Final    NO GROWTH 5 DAYS Performed at [facility], [address], [place], [place] [address]    Report Status 06/19/2021 FINAL  Final  Culture, blood (routine x 2)     Status: None   Collection Time: 06/14/21  8:13 PM   Specimen: BLOOD  RIGHT HAND  Result Value Ref Range Status   Specimen Description BLOOD RIGHT HAND  Final   Special Requests   Final    AEROBIC BOTTLE ONLY Blood Culture results may not be optimal due to an inadequate volume of blood received in culture bottles   Culture   Final    NO GROWTH 5 DAYS Performed at [facility], [address] [place], [place] [address]    Report Status 06/19/2021 FINAL  Final  Surgical pcr screen     Status: Abnormal   Collection Time: 06/16/21 11:08 AM   Specimen: Nasal Mucosa; Nasal Swab  Result Value Ref Range Status   MRSA, PCR NEGATIVE NEGATIVE Final   Staphylococcus aureus POSITIVE (A) NEGATIVE Final    Comment: (NOTE) The Xpert SA Assay (FDA approved for NASAL specimens in patients 75 years of age and older), is one component of a comprehensive surveillance program. It is not intended to diagnose infection nor to guide or monitor treatment. Performed at [facility], [address] [place], [place] [address]   Aerobic/Anaerobic Culture w Gram Stain (surgical/deep wound)     Status: None   Collection Time: 06/16/21 12:42 PM   Specimen: Abscess  Result Value Ref Range Status   Specimen Description ABSCESS  Final   Special Requests EPIDURAL  ABSCESS  Final   Gram Stain   Final    FEW WBC PRESENT,BOTH PMN AND MONONUCLEAR FEW GRAM POSITIVE COCCI    Culture   Final    ABUNDANT STAPHYLOCOCCUS AUREUS NO ANAEROBES ISOLATED Performed at [facility], [address] [place], [place] [address]    Report Status 06/21/2021 FINAL  Final   Organism ID, Bacteria STAPHYLOCOCCUS AUREUS  Final      Susceptibility   Staphylococcus aureus - MIC*    CIPROFLOXACIN <=0.5 SENSITIVE Sensitive     ERYTHROMYCIN <=0.25 SENSITIVE Sensitive     GENTAMICIN <=0.5 SENSITIVE Sensitive     OXACILLIN 0.5 SENSITIVE Sensitive     TETRACYCLINE <=1 SENSITIVE Sensitive     VANCOMYCIN 1 SENSITIVE Sensitive     TRIMETH/SULFA <=10 SENSITIVE Sensitive     CLINDAMYCIN <=0.25 SENSITIVE Sensitive     RIFAMPIN <=0.5 SENSITIVE Sensitive     Inducible Clindamycin NEGATIVE Sensitive     * ABUNDANT STAPHYLOCOCCUS AUREUS      Radiology Studies: No results found.  Scheduled Meds:  allopurinol  300 mg Per Tube Daily   amLODipine  10 mg Per Tube Daily   aspirin  81 mg Per Tube Daily   Chlorhexidine Gluconate Cloth  6 each Topical Daily   feeding supplement (OSMOLITE 1.2 CAL)  1,000 mL Per Tube Q24H   feeding supplement (PROSource TF)  45 mL Per Tube BID   folic acid  1 mg Per Tube Daily   free water  115 mL Per Tube Q4H   multivitamin with minerals  1 tablet Per Tube Daily   rifampin  300 mg Oral Q12H   thiamine injection  100 mg Intravenous Daily   Continuous Infusions:  dextrose     Nafcillin 12g infusion in dextrose 5% 500 mL 20.8 mL/hr at 06/23/21 2204     LOS: 13 days   Time spent: 35 minutes.  Azucena FallenWilliam C Josef Tourigny, DO [facility] Pager: Secure chat  06/24/2021, 7:34 AM

## 2021-06-25 LAB — GLUCOSE, CAPILLARY
Glucose-Capillary: 108 mg/dL — ABNORMAL HIGH (ref 70–99)
Glucose-Capillary: 124 mg/dL — ABNORMAL HIGH (ref 70–99)
Glucose-Capillary: 127 mg/dL — ABNORMAL HIGH (ref 70–99)
Glucose-Capillary: 136 mg/dL — ABNORMAL HIGH (ref 70–99)
Glucose-Capillary: 139 mg/dL — ABNORMAL HIGH (ref 70–99)
Glucose-Capillary: 139 mg/dL — ABNORMAL HIGH (ref 70–99)
Glucose-Capillary: 154 mg/dL — ABNORMAL HIGH (ref 70–99)

## 2021-06-25 LAB — BASIC METABOLIC PANEL
Anion gap: 9 (ref 5–15)
BUN: 82 mg/dL — ABNORMAL HIGH (ref 8–23)
CO2: 26 mmol/L (ref 22–32)
Calcium: 8.4 mg/dL — ABNORMAL LOW (ref 8.9–10.3)
Chloride: 121 mmol/L — ABNORMAL HIGH (ref 98–111)
Creatinine, Ser: 1.81 mg/dL — ABNORMAL HIGH (ref 0.44–1.00)
GFR, Estimated: 29 mL/min — ABNORMAL LOW (ref >60)
Glucose, Bld: 147 mg/dL — ABNORMAL HIGH (ref 70–99)
Potassium: 3.1 mmol/L — ABNORMAL LOW (ref 3.5–5.1)
Sodium: 156 mmol/L — ABNORMAL HIGH (ref 135–145)

## 2021-06-25 MED ORDER — POTASSIUM CHLORIDE 20 MEQ PO PACK
80.0000 meq | PACK | Freq: Once | ORAL | Status: AC
  Administered 2021-06-25: 80 meq
  Filled 2021-06-25: qty 4

## 2021-06-25 NOTE — TOC Progression Note (Signed)
Transition of Care [facility]) - Progression Note    Patient Details  Name: Kristy [name] MRN: [id number] Date of Birth: 02-11-1946  Transition of Care [facility]) CM/SW Contact  [name], RN Phone Number: 06/25/2021, 2:07 PM  Clinical Narrative:    DTP Team will be assuming care of the patient for transitions of care needs.  I spoke with Jonetta Speak, RNCM on the phone and received report on the patient and plans for transitions of care.  I called and left a message with the daughter, Kristy [name] [phone] and husband, Kristy [name] on the phone - [phone].  The patient's daughter's voice mail was full , but I was able to leave a message with the home phone listed for the patient's husband.  CM and MSW with DTP will continue to follow the patient for transitions of care needs.   Expected Discharge Plan: Skilled Nursing Facility Barriers to Discharge: Continued Medical Work up, SNF Pending bed offer  Expected Discharge Plan and Services Expected Discharge Plan: Skilled Nursing Facility     Post Acute Care Choice: Skilled Nursing Facility Living arrangements for the past 2 months: Single Family Home                                       Social Determinants of Health (SDOH) Interventions    Readmission Risk Interventions No flowsheet data found.

## 2021-06-25 NOTE — Progress Notes (Addendum)
PROGRESS NOTE  Kristy [name]  GDJ:[id number] DOB: Dec 31, 1945 DOA: 06/10/2021 PCP: Darrow Bussing, MD   Brief Narrative: Kristy [name] is a 75 y.o. female with a history of alcohol use who presented to the ED for frequent falls, generalized weakness, and confusion. She was found to be afebrile, hypertensive, and without hypoxia. Also had hypokalemia (K 2.6), SCr 1.88 (above baseline ~1.5), LFT elevation (AST 576, ALT 305). Ammonia normal. EtOH, salicylate, tylenol levels negative. CT head and cervical spine were reassuring, though the patient was unable to rise to seated position from laying prompting consultation for admission. The patient was given potassium supplementation, started on CIWA protocol, and admitted with note made of continued AMS. She has remained weak and confused, though on 8/5 became less interactive, retaining urine, WBC noted to be rising. Blood and urine cultures ordered, ceftriaxone given with bacteriuria and urinary retention. MR brain has not been done yet. Neurology consulted with worsening mental status and nuchal rigidity. LP under fluoro requested, empirically treating meningitis/encephalitis.   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);  Cervical discitis/osteomyelitis;  UTI (POA);  Cannot rule out concurrent meninigitis - Neuro, ID, and Neurosurgery consulted - Appreciate insight and recommendations -TTE unremarkable for overt vegetation - ID initially concerned for possible systemic or central etiology given multiple sites of staph infection -S/P I/D of  06/16/21 - noted copious amounts of pus in the longus colli with myositis, discitis, osteomyelitis, large epidural abscess s/p drainage -  Staph Aureus per cultures - Nafcilln pharmacy dosed 8/07 - 06/30/21 FOLLOWED BY Cefazolin pharmacy dosed 8/22 - 08/11/2021 - Rifampin 600 mg BID to be continued throughout the treatment as she can tolerate for 8 weeks; End Dates: 8/21 for nafcillin; 10/02 for cefazolin   Acute metabolic encephalopathy: Multifactorial, likely also a chronic component; resolving.  - Neurology consulted - bedside LP unable to be performed - cultures to be obtained as above with abscess drainage - MRI remarkable for osteo/epidural abscess as above - Empiric coverage downgraded to Nafcillin per ID - Patient's mental status continues to improve slowly, able to follow commands and speak in short sentences - Remains n.p.o., reconsult speech given patient's improved mental status - Core track feeding ongoing, free water flushes increased given electrolyte abnormalities as below; appreciate dietary insight and recommendations  Hypernatremic/hyperchloremic, improving -Likely secondary to previous IV fluid boluses and concurrent IV medications - transitioned to D5 water as possible -Continues downtrending appropriately with increased free water flushes and D5 water IV fluids  UTI, POA, resolved:  - Covered by above abx - treatment completed  Ambulatory dysfunction with fall at home, severe weakness:  - Secondary to above - PT/OT currently recommending SNF rehabilitation once acute infectious process under better control -    Alcoholic hepatitis:  - Based on AST predominance of LFT elevations. Hepatitis panel nonreactive.  - No indication for prednisolone at this time. Continue trending. - AST/ALT downtrending - bilirubin increased marginally    Alcohol abuse:  - Per family - 2 beers per day of late, etoh level negative in ED.  - Continue CIWA. Monitor closely for withdrawal. May DC in AM if no need for ativan. - Cessation counseling to be repeated once pt more clear-headed. -  Empirically supplementing thiamine,  folate, MVM.   Hypokalemia: - Supplemented with resolution   Severe protein caloric malnutrition  -Continue tube feeds and free water flushes via core track NG tube - Albumin continues to downtrend despite increased tube feeds  HTN: - Continue ARB, and increased norvasc.    AKI on stage IIIb CKD, stabilizing:  -Labile in the setting of poor p.o. intake, IV fluids and initiation of free water flushes-we will titrate accordingly - Monitor renal function closely with initiation of vancomycin, acyclovir. - Creatinine now approaching baseline, urine output improving  HLD: - Hold statin for now.  DVT prophylaxis: Lovenox  Code Status: Full Family Communication: Updated over the phone/at bedside - Family updated on prognosis/goals of care discussion. Family wants to bring the patient home if at all possible - we discussed she has a long road ahead of her including antibiotics and physical therapy. Unclear if she will progress enough in the next week to DC home.  Disposition Plan:  Status is: Inpatient  Remains inpatient appropriate because:Persistent severe electrolyte disturbances, Altered mental status, and Unsafe d/c plan  Dispo: The patient is from: Home              Anticipated d/c is to: SNF              Patient currently is not medically stable to d/c.   Difficult to place patient No  Consults: Neurology Psychiatry  Procedures:  None  Antimicrobials: Nafcillin  Subjective: No acute issues or events overnight, awake and alert today. Looking forward to try PO intake with speech therapy.  Objective: Vitals:   06/24/21 0400 06/24/21 2054 06/25/21 0000 06/25/21 0336  BP:  (!) 104/56    Pulse:  92    Resp:      Temp: 98.5 F (36.9 C) 98.4 F (36.9 C) 99 F (37.2 C)   TempSrc: Axillary Oral Axillary   SpO2:  99% 98%   Weight: 78.6 kg   78.8 kg  Height:        Intake/Output Summary (Last 24 hours) at 06/25/2021 0713 Last data filed at 06/25/2021 0537 Gross  per 24 hour  Intake 62.4 ml  Output 1950 ml  Net -1887.6 ml    General:  Pleasantly resting in bed, No acute distress. Aox4 - more appropriately interacting today HEENT:  Normocephalic atraumatic.  Sclerae nonicteric, noninjected.  Extraocular movements intact bilaterally. Coretrak intact. Neck:  Without mass or deformity.  Trachea is midline. Lungs:  Clear to auscultate bilaterally without rhonchi, wheeze, or rales. Heart:  Regular rate and rhythm.  Without murmurs, rubs, or gallops. Abdomen:  Soft, nontender, nondistended.  Without guarding or rebound. Extremities: Without cyanosis, clubbing, edema, or obvious deformity. Vascular:  Dorsalis pedis and posterior tibial pulses palpable bilaterally. Skin:  Warm and dry, no erythema, no ulcerations.  Data Reviewed: I have personally reviewed following labs and imaging studies  CBC: Recent Labs  Lab 06/19/21 0412 06/20/21 0404 06/21/21 1010 06/22/21 0550 06/24/21 1042  WBC 14.3* 15.9* 13.3* 10.7* 9.9  NEUTROABS  --   --  11.8*  --   --   HGB 11.8* 10.9* 10.8* 9.4* 9.8*  HCT 35.7* 33.1* 34.6* 29.5* 30.7*  MCV 99.2 100.6* 103.0* 102.1* 102.7*  PLT 129* 110* 97* 92* 105*    Basic Metabolic Panel: Recent Labs  Lab 06/22/21 0550 06/22/21 1653 06/23/21 0511 06/23/21 1622 06/25/21 0329  NA 157* 152* 151* 154* 156*  K 3.3* 2.9* 4.2 3.0* 3.1*  CL 124* 122* 121*  122* 121*  CO2 24 23 22 24 26   GLUCOSE 171* 254* 158* 146* 147*  BUN 89* 87* 85* 83* 82*  CREATININE 1.83* 1.93* 1.95* 1.94* 1.81*  CALCIUM 8.2* 7.9* 7.7* 8.0* 8.4*    GFR: Estimated Creatinine Clearance: 28.4 mL/min (A) (by C-G formula based on SCr of 1.81 mg/dL (H)). Liver Function Tests: Recent Labs  Lab 06/19/21 0412 06/23/21 0511  AST 168* 51*  ALT 150* 38  ALKPHOS 133* 105  BILITOT 3.0* 4.7*  PROT 5.7* 4.6*  ALBUMIN 1.2* <1.0*    No results for input(s): LIPASE, AMYLASE in the last 168 hours. No results for input(s): AMMONIA in the last 168  hours.  Coagulation Profile: No results for input(s): INR, PROTIME in the last 168 hours.  Cardiac Enzymes: No results for input(s): CKTOTAL, CKMB, CKMBINDEX, TROPONINI in the last 168 hours.  BNP (last 3 results) No results for input(s): PROBNP in the last 8760 hours. HbA1C: No results for input(s): HGBA1C in the last 72 hours. CBG: Recent Labs  Lab 06/24/21 0757 06/24/21 1738 06/24/21 2043 06/24/21 2359 06/25/21 0334  GLUCAP 120* 108* 132* 139* 139*    Lipid Profile: No results for input(s): CHOL, HDL, LDLCALC, TRIG, CHOLHDL, LDLDIRECT in the last 72 hours. Thyroid Function Tests: No results for input(s): TSH, T4TOTAL, FREET4, T3FREE, THYROIDAB in the last 72 hours.  Anemia Panel: No results for input(s): VITAMINB12, FOLATE, FERRITIN, TIBC, IRON, RETICCTPCT in the last 72 hours.  Urine analysis:    Component Value Date/Time   COLORURINE AMBER (A) 06/14/2021 1338   APPEARANCEUR HAZY (A) 06/14/2021 1338   LABSPEC 1.011 06/14/2021 1338   PHURINE 6.0 06/14/2021 1338   GLUCOSEU NEGATIVE 06/14/2021 1338   HGBUR LARGE (A) 06/14/2021 1338   BILIRUBINUR NEGATIVE 06/14/2021 1338   KETONESUR NEGATIVE 06/14/2021 1338   PROTEINUR NEGATIVE 06/14/2021 1338   UROBILINOGEN 0.2 05/14/2008 0020   NITRITE NEGATIVE 06/14/2021 1338   LEUKOCYTESUR NEGATIVE 06/14/2021 1338   Recent Results (from the past 240 hour(s))  Surgical pcr screen     Status: Abnormal   Collection Time: 06/16/21 11:08 AM   Specimen: Nasal Mucosa; Nasal Swab  Result Value Ref Range Status   MRSA, PCR NEGATIVE NEGATIVE Final   Staphylococcus aureus POSITIVE (A) NEGATIVE Final    Comment: (NOTE) The Xpert SA Assay (FDA approved for NASAL specimens in patients 75 years of age and older), is one component of a comprehensive surveillance program. It is not intended to diagnose infection nor to guide or monitor treatment. Performed at [facility], [address] [place], [place] [address]    Aerobic/Anaerobic Culture w Gram Stain (surgical/deep wound)     Status: None   Collection Time: 06/16/21 12:42 PM   Specimen: Abscess  Result Value Ref Range Status   Specimen Description ABSCESS  Final   Special Requests EPIDURAL ABSCESS  Final   Gram Stain   Final    FEW WBC PRESENT,BOTH PMN AND MONONUCLEAR FEW GRAM POSITIVE COCCI    Culture   Final    ABUNDANT STAPHYLOCOCCUS AUREUS NO ANAEROBES ISOLATED Performed at [facility], [address] [place], [place] [address]    Report Status 06/21/2021 FINAL  Final   Organism ID, Bacteria STAPHYLOCOCCUS AUREUS  Final      Susceptibility   Staphylococcus aureus - MIC*    CIPROFLOXACIN <=0.5 SENSITIVE Sensitive     ERYTHROMYCIN <=0.25 SENSITIVE Sensitive     GENTAMICIN <=0.5 SENSITIVE Sensitive     OXACILLIN 0.5  SENSITIVE Sensitive     TETRACYCLINE <=1 SENSITIVE Sensitive     VANCOMYCIN 1 SENSITIVE Sensitive     TRIMETH/SULFA <=10 SENSITIVE Sensitive     CLINDAMYCIN <=0.25 SENSITIVE Sensitive     RIFAMPIN <=0.5 SENSITIVE Sensitive     Inducible Clindamycin NEGATIVE Sensitive     * ABUNDANT STAPHYLOCOCCUS AUREUS      Radiology Studies: DG Swallowing Func-Speech Pathology  Result Date: 06/24/2021 Table formatting from the original result was not included. Objective Swallowing Evaluation: Type of Study: MBS-Modified Barium Swallow Study  Patient Details Name: JAMMIE TROUP MRN: [id number] Date of Birth: 1945-12-12 Today's Date: 06/24/2021 Time: SLP Start Time (ACUTE ONLY): 1230 -SLP Stop Time (ACUTE ONLY): 1300 SLP Time Calculation (min) (ACUTE ONLY): 30 min Past Medical History: No past medical history on file. Past Surgical History: Past Surgical History: Procedure Laterality Date  ANTERIOR CERVICAL DECOMP/DISCECTOMY FUSION N/A 06/16/2021  Procedure: ANTERIOR CERVICAL DISCECTOMY FUSION Cervical five -Cervical six, Evacuation of Epidural Abscess;  Surgeon: Jadene Pierini, MD;  Location: [facility] OR;  Service: Neurosurgery;   Laterality: N/A; HPI: Pt is a 75yo female presenting to [facility] ED on 8/1 after a fall on 7/29. Pt also fell on 8/2 while in the ED. Pt found to be hypokalemic upon admission. 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 Pt is now s/p drainage of abscess and C5-6 ACDF on 06/16/21. Also noted R SI septic arthritis.  PMH: HTN, polycthemia, alcohol use.  Subjective: alerts to name, responds to questions Assessment / Plan / Recommendation CHL IP CLINICAL IMPRESSIONS 06/24/2021 Clinical Impression Ms. Dietzman presents with a moderate dysphagia, impacted by both mechanical and cognitive components.  She was intermittently lethargic throughout the assessment.  Initially, she held her neck in deep flexion, which appeared to offer some protection when swallowing thin liquids - there was no penetration nor aspiration.  Later in the study, when sleepier and when head was in extension and neutral, thin liquids were immediately aspirated with an ensuing cough response. Purees sat in pharynx and pt was unable to move them due to poor wakefulness at that point in the study. Study was terminated and oral suctioning was provided.  Recommend continuing intensive oral care; offer ice chips after oral care.  Maintain NPO status for now.  SLP will follow for therapeutic PO trials -  as mentation improves we will have a clearer understanding of potential impact of ACDF/prevertebral swelling on swallowing.  Marland Kitchen SLP Visit Diagnosis Dysphagia, pharyngeal phase (R13.13) Attention and concentration deficit following -- Frontal lobe and executive function deficit following -- Impact on safety and function Moderate aspiration risk   CHL IP TREATMENT RECOMMENDATION 06/24/2021 Treatment Recommendations Therapy as outlined in treatment plan below   Prognosis 06/24/2021 Prognosis for Safe Diet Advancement Fair Barriers to Reach Goals Cognitive deficits Barriers/Prognosis Comment -- CHL  IP DIET RECOMMENDATION 06/24/2021 SLP Diet Recommendations NPO Liquid Administration via -- Medication Administration -- Compensations -- Postural Changes --   CHL IP OTHER RECOMMENDATIONS 06/24/2021 Recommended Consults -- Oral Care Recommendations Oral care QID Other Recommendations --   CHL IP FOLLOW UP RECOMMENDATIONS 06/24/2021 Follow up Recommendations Skilled Nursing facility   [facility] IP FREQUENCY AND DURATION 06/24/2021 Speech Therapy Frequency (ACUTE ONLY) min 2x/week Treatment Duration 2 weeks      CHL IP ORAL PHASE 06/24/2021 Oral Phase WFL Oral - Pudding Teaspoon -- Oral - Pudding Cup -- Oral - Honey Teaspoon -- Oral - Honey Cup -- Oral - Nectar  Teaspoon -- Oral - Nectar Cup -- Oral - Nectar Straw -- Oral - Thin Teaspoon -- Oral - Thin Cup -- Oral - Thin Straw -- Oral - Puree -- Oral - Mech Soft -- Oral - Regular -- Oral - Multi-Consistency -- Oral - Pill -- Oral Phase - Comment --  CHL IP PHARYNGEAL PHASE 06/24/2021 Pharyngeal Phase Impaired Pharyngeal- Pudding Teaspoon -- Pharyngeal -- Pharyngeal- Pudding Cup -- Pharyngeal -- Pharyngeal- Honey Teaspoon -- Pharyngeal -- Pharyngeal- Honey Cup -- Pharyngeal -- Pharyngeal- Nectar Teaspoon -- Pharyngeal -- Pharyngeal- Nectar Cup -- Pharyngeal -- Pharyngeal- Nectar Straw -- Pharyngeal -- Pharyngeal- Thin Teaspoon -- Pharyngeal -- Pharyngeal- Thin Cup -- Pharyngeal -- Pharyngeal- Thin Straw -- Pharyngeal -- Pharyngeal- Puree -- Pharyngeal -- Pharyngeal- Mechanical Soft -- Pharyngeal -- Pharyngeal- Regular -- Pharyngeal -- Pharyngeal- Multi-consistency -- Pharyngeal -- Pharyngeal- Pill -- Pharyngeal -- Pharyngeal Comment --  No flowsheet data found. [name] 06/24/2021, 3:12 PM               Scheduled Meds:  allopurinol  300 mg Per Tube Daily   amLODipine  10 mg Per Tube Daily   aspirin  81 mg Per Tube Daily   Chlorhexidine Gluconate Cloth  6 each Topical Daily   feeding supplement (PROSource TF)  45 mL Per Tube BID   folic acid  1 mg Per Tube  Daily   free water  150 mL Per Tube Q4H   multivitamin with minerals  1 tablet Per Tube Daily   rifampin  300 mg Oral Q12H   thiamine injection  100 mg Intravenous Daily   Continuous Infusions:  dextrose     feeding supplement (OSMOLITE 1.2 CAL) 1,000 mL (06/25/21 0537)   Nafcillin 12g infusion in dextrose 5% 500 mL 20.8 mL/hr at 06/25/21 0001     LOS: 14 days   Time spent: 35 minutes.  Azucena Fallen, DO [facility] Pager: Secure chat  06/25/2021, 7:13 AM

## 2021-06-25 NOTE — Progress Notes (Signed)
Occupational Therapy Treatment Patient Details Name: Kristy [name] MRN: [id number] DOB: 1946/08/12 Today's Date: 06/25/2021    History of present illness Pt is a 75yo female presenting to [facility] ED on 8/1 after a fall on 7/29. Pt also fell on 8/2 while in the ED. Pt found to be hypokalemic upon admission. 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 Pt is now s/p drainage of abscess and C5-6 ACDF on 06/16/21. Also noted R SI septic arthritis.  PMH: HTN, polycthemia, alcohol use.   OT comments  Pt less lethargic today, opening eyes at times during session and more interactive. Pt with attempts of movement with BU/LE. Appears very painful to touch but able to move with increased time. Pt assisting to push through LLE with rolling to change bed linen saturated with liquid stool. Pt developing cervical rotation contracture to R and would benefit greatly from being positioned inL side lying to offload hip/sacrum and encourage cervical rotation to L. Pt left in modified chair position. Encourage chair position by nsg staff TID. Will continue to follow acutely and recommend SNF for rehab.   Follow Up Recommendations  SNF    Equipment Recommendations  3 in 1 bedside commode;Other (comment)    Recommendations for Other Services      Precautions / Restrictions Precautions Precautions: Fall;Cervical;Other (comment) Precaution Booklet Issued: No Precaution Comments: NG       Mobility Bed Mobility Overal bed mobility: Needs Assistance   Rolling: +2 for physical assistance;Total assist         General bed mobility comments: Pt not initiating movement but able to hold LE briefly in bridging position    Transfers                 General transfer comment: unable  at this time    Balance                                           ADL either performed or assessed with clinical judgement   ADL        Grooming:  (thick mucous film suctioned from mouth)                       Toileting- Clothing Manipulation and Hygiene:  (large liquid BM; bed pad changed with total A +2)         General ADL Comments: total A with all ADL     Vision       Perception     Praxis      Cognition Arousal/Alertness: Lethargic Behavior During Therapy: Flat affect;Restless Overall Cognitive Status: Impaired/Different from baseline                   Orientation Level: Disoriented to;Time;Situation Current Attention Level: Sustained Memory: Decreased recall of precautions;Decreased short-term memory Following Commands: Follows one step commands inconsistently Safety/Judgement: Decreased awareness of safety;Decreased awareness of deficits Awareness: Intellectual Problem Solving: Slow processing;Decreased initiation;Difficulty sequencing;Requires verbal cues          Exercises Exercises: General Upper Extremity General Exercises - Upper Extremity Shoulder Flexion: AAROM;Both;Supine Shoulder ABduction: Both;AAROM;PROM;10 reps Elbow Flexion: AAROM;Both;Supine Elbow Extension: AAROM;Both;Supine Wrist Extension: PROM;Both;10 reps Digit Composite Flexion: PROM;AROM;AAROM;Both;10 reps General Exercises - Lower Extremity Ankle Circles/Pumps: Both;10 reps;Supine;AROM Quad Sets: AROM;Both;5 reps;Supine Other Exercises Other Exercises: head positioned to reduce cervical extension  with head in good posiiton in midline   Shoulder Instructions       General Comments Encouraged cervical ROM adn positioning of head in better midline position    Pertinent Vitals/ Pain       Pain Assessment: Faces Faces Pain Scale: Hurts whole lot Pain Location: moaning/crying with minimal movement Pain Descriptors / Indicators: Crying;Grimacing;Moaning Pain Intervention(s): Limited activity within patient's tolerance;Repositioned  Home Living                                           Prior Functioning/Environment              Frequency  Min 2X/week        Progress Toward Goals  OT Goals(current goals can now be found in the care plan section)  Progress towards OT goals: Goals drowngraded-see care plan  Acute Rehab OT Goals Patient Stated Goal: per family for pt to get better OT Goal Formulation: Patient unable to participate in goal setting Time For Goal Achievement: 06/25/21 ADL Goals Pt Will Perform Eating: with mod assist;with adaptive utensils;sitting Pt Will Perform Grooming: with mod assist;sitting;with adaptive equipment Pt Will Perform Upper Body Bathing: with max assist;bed level Additional ADL Goal #2: Pt will tolerate sitting EOB with Mac A +2 x 5 min Additional ADL Goal #3: Pt will complete hand to mouth pattern with BUE with mod A in preparation for self feeding  Plan Discharge plan remains appropriate    Co-evaluation                 AM-PAC OT "6 Clicks" Daily Activity     Outcome Measure   Help from another person eating meals?: Total Help from another person taking care of personal grooming?: Total Help from another person toileting, which includes using toliet, bedpan, or urinal?: Total Help from another person bathing (including washing, rinsing, drying)?: Total Help from another person to put on and taking off regular upper body clothing?: Total Help from another person to put on and taking off regular lower body clothing?: Total 6 Click Score: 6    End of Session    OT Visit Diagnosis: Unsteadiness on feet (R26.81);Other abnormalities of gait and mobility (R26.89);Repeated falls (R29.6);Muscle weakness (generalized) (M62.81);Other symptoms and signs involving cognitive function;Pain Pain - part of body:  (all over)   Activity Tolerance Patient limited by fatigue;Patient limited by lethargy;Patient limited by pain   Patient Left in bed;with call bell/phone within reach;with bed alarm set;with  family/visitor present (modified chair position)   Nurse Communication Mobility status;Other (comment) (need to repositioin pt q 2 hrs; incontinent episode)        Time: 1105-1140 OT Time Calculation (min): 35 min  Charges: OT General Charges $OT Visit: 1 Visit OT Treatments $Self Care/Home Management : 23-37 mins  Maurie Boettcher, OT/L   Acute OT Clinical Specialist [place] Pager [phone] Office [phone]    [facility] 06/25/2021, 4:41 PM

## 2021-06-25 NOTE — Progress Notes (Signed)
  Speech Language Pathology Treatment: Dysphagia  Patient Details Name: Kristy [name] MRN: [id number] DOB: 1946/04/04 Today's Date: 06/25/2021 Time: 1130-1140 SLP Time Calculation (min) (ACUTE ONLY): 10 min  Assessment / Plan / Recommendation Clinical Impression  Pt demonstrates no improvement in arousal for adequate PO intake. Though awake she only briefly responds to ice chip. Vocal quality very wet at baseline and after ice in chin tuck position. One swallow elicited with ongoing coughing and gurgling after swallow (likely standing secretions and ice). Despite cueing, suction and further trials pt became progressively less responsive. Pt still not appropriate for oral intake.   HPI HPI: Pt is a 75yo female presenting to [facility] ED on 8/1 after a fall on 7/29. Pt also fell on 8/2 while in the ED. Pt found to be hypokalemic upon admission. 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 Pt is now s/p drainage of abscess and C5-6 ACDF on 06/16/21. Also noted R SI septic arthritis.  PMH: HTN, polycthemia, alcohol use.      SLP Plan  Continue with current plan of care       Recommendations  Diet recommendations: NPO                Oral Care Recommendations: Oral care QID Follow up Recommendations: Skilled Nursing facility SLP Visit Diagnosis: Dysphagia, pharyngeal phase (R13.13) Plan: Continue with current plan of care       GO                Doyl Bitting, Riley Nearing 06/25/2021, 2:02 PM

## 2021-06-26 DIAGNOSIS — G061 Intraspinal abscess and granuloma: Principal | ICD-10-CM

## 2021-06-26 LAB — BASIC METABOLIC PANEL
Anion gap: 8 (ref 5–15)
BUN: 77 mg/dL — ABNORMAL HIGH (ref 8–23)
CO2: 26 mmol/L (ref 22–32)
Calcium: 8.3 mg/dL — ABNORMAL LOW (ref 8.9–10.3)
Chloride: 125 mmol/L — ABNORMAL HIGH (ref 98–111)
Creatinine, Ser: 1.73 mg/dL — ABNORMAL HIGH (ref 0.44–1.00)
GFR, Estimated: 30 mL/min — ABNORMAL LOW (ref >60)
Glucose, Bld: 150 mg/dL — ABNORMAL HIGH (ref 70–99)
Potassium: 3.5 mmol/L (ref 3.5–5.1)
Sodium: 159 mmol/L — ABNORMAL HIGH (ref 135–145)

## 2021-06-26 LAB — CBC
HCT: 25.5 % — ABNORMAL LOW (ref 36.0–46.0)
Hemoglobin: 7.9 g/dL — ABNORMAL LOW (ref 12.0–15.0)
MCH: 32.2 pg (ref 26.0–34.0)
MCHC: 31 g/dL (ref 30.0–36.0)
MCV: 104.1 fL — ABNORMAL HIGH (ref 80.0–100.0)
Platelets: 113 10*3/uL — ABNORMAL LOW (ref 150–400)
RBC: 2.45 MIL/uL — ABNORMAL LOW (ref 3.87–5.11)
RDW: 15.4 % (ref 11.5–15.5)
WBC: 9.7 10*3/uL (ref 4.0–10.5)
nRBC: 0 % (ref 0.0–0.2)

## 2021-06-26 LAB — GLUCOSE, CAPILLARY
Glucose-Capillary: 107 mg/dL — ABNORMAL HIGH (ref 70–99)
Glucose-Capillary: 133 mg/dL — ABNORMAL HIGH (ref 70–99)
Glucose-Capillary: 163 mg/dL — ABNORMAL HIGH (ref 70–99)
Glucose-Capillary: 140 mg/dL — ABNORMAL HIGH (ref 70–99)
Glucose-Capillary: 139 mg/dL — ABNORMAL HIGH (ref 70–99)
Glucose-Capillary: 138 mg/dL — ABNORMAL HIGH (ref 70–99)

## 2021-06-26 MED ORDER — HEPARIN SODIUM (PORCINE) 5000 UNIT/ML IJ SOLN
5000.0000 [IU] | Freq: Three times a day (TID) | INTRAMUSCULAR | Status: DC
  Administered 2021-06-26 (×3): 5000 [IU] via SUBCUTANEOUS
  Filled 2021-06-26 (×2): qty 1

## 2021-06-26 MED ORDER — KCL IN DEXTROSE-NACL 20-5-0.45 MEQ/L-%-% IV SOLN
INTRAVENOUS | Status: DC
  Filled 2021-06-26 (×10): qty 1000

## 2021-06-26 MED ORDER — ALBUMIN HUMAN 25 % IV SOLN
12.5000 g | Freq: Four times a day (QID) | INTRAVENOUS | Status: AC
Start: 2021-06-26 — End: 2021-06-27
  Administered 2021-06-26 – 2021-06-27 (×4): 12.5 g via INTRAVENOUS
  Filled 2021-06-26 (×4): qty 50

## 2021-06-26 MED ORDER — METAXALONE 800 MG PO TABS
400.0000 mg | ORAL_TABLET | Freq: Three times a day (TID) | ORAL | Status: DC
  Administered 2021-06-26 – 2021-07-05 (×29): 400 mg
  Filled 2021-06-26 (×29): qty 0.5
  Filled 2021-06-26: qty 1
  Filled 2021-06-26 (×2): qty 0.5

## 2021-06-26 MED ORDER — FREE WATER
200.0000 mL | Status: DC
  Administered 2021-06-26 – 2021-07-01 (×28): 200 mL

## 2021-06-26 NOTE — TOC Progression Note (Signed)
Transition of Care [facility]) - Progression Note    Patient Details  Name: Kristy [name] MRN: [id number] Date of Birth: 10-May-1946  Transition of Care [facility]) CM/SW Contact  [name], RN Phone Number: 06/26/2021, 9:50 AM  Clinical Narrative:    Case management called and spoke with the patient's granddaughter, Kindell Strada - and asked that she have the patient's daughter return my call regarding transitions of care needs for the patient.  CM and MSW with DTP Team will continue to follow the patient for transitions of care needs.   Expected Discharge Plan: Skilled Nursing Facility Barriers to Discharge: Continued Medical Work up, SNF Pending bed offer  Expected Discharge Plan and Services Expected Discharge Plan: Skilled Nursing Facility     Post Acute Care Choice: Skilled Nursing Facility Living arrangements for the past 2 months: Single Family Home                                       Social Determinants of Health (SDOH) Interventions    Readmission Risk Interventions No flowsheet data found.

## 2021-06-26 NOTE — Progress Notes (Addendum)
[facility] PROGRESS NOTE  Kristy [name] SKA:[id number] DOB: 03/17/1946 DOA: 06/10/2021 PCP: Lujean Amel, MD  Status: Remains inpatient appropriate because:Persistent severe electrolyte disturbances, Altered mental status, Unsafe d/c plan, IV treatments appropriate due to intensity of illness or inability to take PO, and Inpatient level of care appropriate due to severity of illness  Dispo: The patient is from: Home              Anticipated d/c is to: SNF              Patient currently is not medically stable to d/c.   Difficult to place patient No    Level of care: Progressive  Code Status: Full Family Communication:  DVT prophylaxis: Changed to SQ heparin given estimated GFR 29 COVID vaccination status: [place] 02/06/2020 and 02/28/2020; will need booster once has improved from a neurological standpoint  Foley catheter: Yes POA: No Date inserted: 06/16/21 Still needed?/Rationale: Yes-accurate I's/O while hydrating for AKI and hypernatremia  HPI: 75 y.o. female with a history of alcohol use who presented to the ED for frequent falls, generalized weakness, and confusion. She was found to be afebrile, hypertensive, and without hypoxia. Also had hypokalemia (K 2.6), SCr 1.88 (above baseline ~1.5), LFT elevation (AST 576, ALT 305). Ammonia normal. EtOH, salicylate, tylenol levels negative. CT head and cervical spine were reassuring, though the patient was unable to rise to seated position from laying prompting consultation for admission. The patient was given potassium supplementation, started on CIWA protocol, and admitted with note made of continued AMS. She has remained weak and confused, though on 8/5 became less interactive, retaining urine, WBC noted to be rising. Blood and urine cultures ordered, ceftriaxone given with bacteriuria and urinary retention. MR brain has not been done yet. Neurology consulted with worsening mental status and nuchal rigidity. LP under fluoro requested,  empirically treating meningitis/encephalitis.   Subjective: Awake and weakly follows commands and verbally responds.  Became more alert and verbally responsive with attempts to try to reposition head from a dominant right position closer to midline.  Objective: Vitals:   06/26/21 0035 06/26/21 0453  BP: (!) 120/59 (!) 100/59  Pulse: 99 100  Resp: 18 20  Temp:    SpO2: 99% 99%    Intake/Output Summary (Last 24 hours) at 06/26/2021 0750 Last data filed at 06/26/2021 0514 Gross per 24 hour  Intake --  Output 901 ml  Net -901 ml   Filed Weights   06/22/21 0231 06/24/21 0400 06/25/21 0336  Weight: 79.7 kg 78.6 kg 78.8 kg    Exam: Constitutional: NAD, calm, extremely uncomfortable with any attempts to reposition head Respiratory: clear to auscultation bilaterally, stable on room air to saturations 98 to 99%.  Normal respiratory effort. No accessory muscle use.  Cardiovascular: Regular rate and rhythm, no murmurs / rubs / gallops.  Focal edema of upper extremities. 2+ pedal pulses.  Abdomen: no tenderness, no masses palpated. Cortrak in place for enteral feedings.  Bowel sounds positive. LBM 8/16 Genitourinary: Foley catheter in place with amber-colored urine noted in bedside bag Musculoskeletal: Has torticollis/contracture of cervical neck with patient preference for head to the right-attempts to move the head and neck even with minimal movement towards the left results in exquisite pain for the patient- a towel was rolled up and placed underneath the head to try to promote movement towards the left side. Skin: no rashes, lesions, ulcers. No induration Neurologic: CN 2-12 grossly intact. Sensation intact, Strength 2/5 x all 4 extremities.  Psychiatric: Alert and oriented x 3. Normal mood.    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;  UTI  (POA);  - Neuro, ID, and Neurosurgery consulted -TTE unremarkable for overt vegetation - ID initially concerned for possible systemic or central etiology given multiple sites of staph infection -S/P I/D on  06/16/21 - noted copious amounts of pus in the longus colli with myositis, discitis, osteomyelitis, large epidural abscess s/p drainage cultures positive for MSSA- Staph Aureus per cultures - [place] dosed 8/07 - 06/30/21 FOLLOWED BY Cefazolin pharmacy dosed 8/22 - 08/11/2021 - Rifampin 600 mg BID to be continued throughout the treatment as she can tolerate for 8 weeks; End Dates: 8/21 for nafcillin; 10/02 for cefazolin   -Given underlying CKD 3B uncertain if patient would be an appropriate candidate for PICC line -8/17 added low-dose Skelaxin 3 times daily to help with muscle hypertonicity.  Already has narcotics and Tylenol available for pain  Acute metabolic encephalopathy secondary to sepsis physiology and persistent hypernatremia - Neurology consulted - bedside LP unable to be performed  - MRI remarkable for osteo/epidural abscess as above - Patient's mental status continues to improve slowly, able to follow commands and speak in short sentences -SLP following and given altered mentation and overall weakness unable to allow oral intake at this time noting patient unable to swallow adequately -cortrack for feedings and free water/meds   Hypernatremic/hyperchloremic, improving -Likely secondary to previous IV fluid boluses and concurrent IV medications  -Appears very dry noting continues to have elevated BUN and abnormal creatinine, low EGFR and sodium remains elevated at 159 (ECHO 8/9 also consistent with volume depletion noting CVP 3) -Was given 500 cc D5W bolus on 8/16.  On 8/17 will initiate D5 half-normal saline at 100 cc/h to increase free water again to 200 cc every 4 hours -Patient also has third spacing of fluid and her albumin is less than 1.0 so we will give a trial of  albumin infusion on 8/17  AKI on stage IIIb CKD, stabilizing:  -Baseline renal function: BUN 24 and creatinine 1.5 with he GFR slightly greater than 30 -8/17: BUN 77 and creatinine 1.73 - Monitor renal function closely with initiation of vancomycin, acyclovir. -Urine output variable between 400 cc per shift to up to 2800 cc per shift in the past 7 to 10 days   UTI, POA, resolved:  - Covered by above abx - treatment completed   Ambulatory dysfunction with fall at home, severe weakness:  - Secondary to above - PT/OT currently recommending SNF rehabilitation once acute infectious process under better control -    Alcoholic hepatitis:  - Based on AST predominance of LFT elevations. Hepatitis panel nonreactive.  Family confirms that patient drank 2 beers per day prior to admission - No indication for prednisolone at this time. Continue trending. - AST/ALT downtrending - bilirubin increased marginally    Alcohol abuse:  - Per family - 2 beers per day of late, etoh level negative in ED.  - Cessation counseling to be repeated once pt more clear-headed. - Empirically supplementing thiamine, folate, MVM.   Hypokalemia: -8/16 potassium was low at 3.1 and after repletion has increased to 3.5 -Have added low-dose potassium to maintenance fluids   Severe protein caloric malnutrition  -Continue tube feeds and free water flushes via cortrack - Albumin continues to downtrend despite increased tube feeds we will give 4 doses of albumin as above Nutrition  Problem: Moderate Malnutrition Etiology: chronic illness Signs/Symptoms: moderate muscle depletion, moderate fat depletion, mild muscle depletion, mild fat depletion Interventions: Prostat, Tube feeding   Anemia of critical illness as well as associated chronic kidney disease -8/17 hemoglobin 7.9 (?  Hemodilution) -Follow labs closely-no obvious signs of bleeding -Consider initiating Aranesp or other form of erythropoietin  HTN/grade 1  diastolic dysfunction: -Hold ARB given AKI -Continue Norvasc -Echo this admission with an EF of 65 to 70% with right 1 diastolic physiology, also noted with mild MR and AR   HLD: - Hold statin for now; AST has decreased to 51 and ALT normal at 38 total bili slightly elevated at 1.3      Data Reviewed: Basic Metabolic Panel: Recent Labs  Lab 06/22/21 0550 06/22/21 1653 06/23/21 0511 06/23/21 1622 06/25/21 0329  NA 157* 152* 151* 154* 156*  K 3.3* 2.9* 4.2 3.0* 3.1*  CL 124* 122* 121* 122* 121*  CO2 '24 23 22 24 26  ' GLUCOSE 171* 254* 158* 146* 147*  BUN 89* 87* 85* 83* 82*  CREATININE 1.83* 1.93* 1.95* 1.94* 1.81*  CALCIUM 8.2* 7.9* 7.7* 8.0* 8.4*   Liver Function Tests: Recent Labs  Lab 06/23/21 0511  AST 51*  ALT 38  ALKPHOS 105  BILITOT 4.7*  PROT 4.6*  ALBUMIN <1.0*   No results for input(s): LIPASE, AMYLASE in the last 168 hours. No results for input(s): AMMONIA in the last 168 hours. CBC: Recent Labs  Lab 06/20/21 0404 06/21/21 1010 06/22/21 0550 06/24/21 1042  WBC 15.9* 13.3* 10.7* 9.9  NEUTROABS  --  11.8*  --   --   HGB 10.9* 10.8* 9.4* 9.8*  HCT 33.1* 34.6* 29.5* 30.7*  MCV 100.6* 103.0* 102.1* 102.7*  PLT 110* 97* 92* 105*   Cardiac Enzymes: No results for input(s): CKTOTAL, CKMB, CKMBINDEX, TROPONINI in the last 168 hours. BNP (last 3 results) No results for input(s): BNP in the last 8760 hours.  ProBNP (last 3 results) No results for input(s): PROBNP in the last 8760 hours.  CBG: Recent Labs  Lab 06/25/21 1233 06/25/21 1624 06/25/21 1951 06/25/21 2356 06/26/21 0416  GLUCAP 124* 154* 127* 108* 107*    Recent Results (from the past 240 hour(s))  Surgical pcr screen     Status: Abnormal   Collection Time: 06/16/21 11:08 AM   Specimen: Nasal Mucosa; Nasal Swab  Result Value Ref Range Status   MRSA, PCR NEGATIVE NEGATIVE Final   Staphylococcus aureus POSITIVE (A) NEGATIVE Final    Comment: (NOTE) The Xpert SA Assay (FDA approved  for NASAL specimens in patients 87 years of age and older), is one component of a comprehensive surveillance program. It is not intended to diagnose infection nor to guide or monitor treatment. Performed at [facility], [place] [address], [place], [place] [address]   Aerobic/Anaerobic Culture w Gram Stain (surgical/deep wound)     Status: None   Collection Time: 06/16/21 12:42 PM   Specimen: Abscess  Result Value Ref Range Status   Specimen Description ABSCESS  Final   Special Requests EPIDURAL ABSCESS  Final   Gram Stain   Final    FEW WBC PRESENT,BOTH PMN AND MONONUCLEAR FEW GRAM POSITIVE COCCI    Culture   Final    ABUNDANT STAPHYLOCOCCUS AUREUS NO ANAEROBES ISOLATED Performed at [facility], [place] [address], [place], [place] [address]    Report Status 06/21/2021 FINAL  Final   Organism ID, Bacteria STAPHYLOCOCCUS AUREUS  Final  Susceptibility   Staphylococcus aureus - MIC*    CIPROFLOXACIN <=0.5 SENSITIVE Sensitive     ERYTHROMYCIN <=0.25 SENSITIVE Sensitive     GENTAMICIN <=0.5 SENSITIVE Sensitive     OXACILLIN 0.5 SENSITIVE Sensitive     TETRACYCLINE <=1 SENSITIVE Sensitive     VANCOMYCIN 1 SENSITIVE Sensitive     TRIMETH/SULFA <=10 SENSITIVE Sensitive     CLINDAMYCIN <=0.25 SENSITIVE Sensitive     RIFAMPIN <=0.5 SENSITIVE Sensitive     Inducible Clindamycin NEGATIVE Sensitive     * ABUNDANT STAPHYLOCOCCUS AUREUS     Studies: DG Swallowing Func-Speech Pathology  Result Date: 06/24/2021 Table formatting from the original result was not included. Objective Swallowing Evaluation: Type of Study: MBS-Modified Barium Swallow Study  Patient Details Name: MEIKO IVES MRN: [id number] Date of Birth: May 23, 1946 Today's Date: 06/24/2021 Time: SLP Start Time (ACUTE ONLY): 61 -SLP Stop Time (ACUTE ONLY): 1300 SLP Time Calculation (min) (ACUTE ONLY): 30 min Past Medical History: No past medical history on file. Past Surgical History: Past Surgical History: Procedure  Laterality Date  ANTERIOR CERVICAL DECOMP/DISCECTOMY FUSION N/A 06/16/2021  Procedure: ANTERIOR CERVICAL DISCECTOMY FUSION Cervical five -Cervical six, Evacuation of Epidural Abscess;  Surgeon: Judith Part, MD;  Location: [place];  Service: Neurosurgery;  Laterality: N/A; HPI: Pt is a 75yo female presenting to [facility] ED on 8/1 after a fall on 7/29. Pt also fell on 8/2 while in the ED. Pt found to be hypokalemic upon admission. 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 Pt is now s/p drainage of abscess and C5-6 ACDF on 06/16/21. Also noted R SI septic arthritis.  PMH: HTN, polycthemia, alcohol use.  Subjective: alerts to name, responds to questions Assessment / Plan / Recommendation CHL IP CLINICAL IMPRESSIONS 06/24/2021 Clinical Impression Ms. Romero presents with a moderate dysphagia, impacted by both mechanical and cognitive components.  She was intermittently lethargic throughout the assessment.  Initially, she held her neck in deep flexion, which appeared to offer some protection when swallowing thin liquids - there was no penetration nor aspiration.  Later in the study, when sleepier and when head was in extension and neutral, thin liquids were immediately aspirated with an ensuing cough response. Purees sat in pharynx and pt was unable to move them due to poor wakefulness at that point in the study. Study was terminated and oral suctioning was provided.  Recommend continuing intensive oral care; offer ice chips after oral care.  Maintain NPO status for now.  SLP will follow for therapeutic PO trials -  as mentation improves we will have a clearer understanding of potential impact of ACDF/prevertebral swelling on swallowing.  Marland Kitchen SLP Visit Diagnosis Dysphagia, pharyngeal phase (R13.13) Attention and concentration deficit following -- Frontal lobe and executive function deficit following -- Impact on safety and function Moderate aspiration  risk   CHL IP TREATMENT RECOMMENDATION 06/24/2021 Treatment Recommendations Therapy as outlined in treatment plan below   Prognosis 06/24/2021 Prognosis for Safe Diet Advancement Fair Barriers to Reach Goals Cognitive deficits Barriers/Prognosis Comment -- CHL IP DIET RECOMMENDATION 06/24/2021 SLP Diet Recommendations NPO Liquid Administration via -- Medication Administration -- Compensations -- Postural Changes --   CHL IP OTHER RECOMMENDATIONS 06/24/2021 Recommended Consults -- Oral Care Recommendations Oral care QID Other Recommendations --   CHL IP FOLLOW UP RECOMMENDATIONS 06/24/2021 Follow up Recommendations Skilled Nursing facility   [facility] IP FREQUENCY AND DURATION 06/24/2021 Speech Therapy Frequency (ACUTE ONLY) min 2x/week Treatment Duration 2 weeks  CHL IP ORAL PHASE 06/24/2021 Oral Phase WFL Oral - Pudding Teaspoon -- Oral - Pudding Cup -- Oral - Honey Teaspoon -- Oral - Honey Cup -- Oral - Nectar Teaspoon -- Oral - Nectar Cup -- Oral - Nectar Straw -- Oral - Thin Teaspoon -- Oral - Thin Cup -- Oral - Thin Straw -- Oral - Puree -- Oral - Mech Soft -- Oral - Regular -- Oral - Multi-Consistency -- Oral - Pill -- Oral Phase - Comment --  CHL IP PHARYNGEAL PHASE 06/24/2021 Pharyngeal Phase Impaired Pharyngeal- Pudding Teaspoon -- Pharyngeal -- Pharyngeal- Pudding Cup -- Pharyngeal -- Pharyngeal- Honey Teaspoon -- Pharyngeal -- Pharyngeal- Honey Cup -- Pharyngeal -- Pharyngeal- Nectar Teaspoon -- Pharyngeal -- Pharyngeal- Nectar Cup -- Pharyngeal -- Pharyngeal- Nectar Straw -- Pharyngeal -- Pharyngeal- Thin Teaspoon -- Pharyngeal -- Pharyngeal- Thin Cup -- Pharyngeal -- Pharyngeal- Thin Straw -- Pharyngeal -- Pharyngeal- Puree -- Pharyngeal -- Pharyngeal- Mechanical Soft -- Pharyngeal -- Pharyngeal- Regular -- Pharyngeal -- Pharyngeal- Multi-consistency -- Pharyngeal -- Pharyngeal- Pill -- Pharyngeal -- Pharyngeal Comment --  No flowsheet data found. Juan Quam Laurice 06/24/2021, 3:12 PM                Scheduled Meds:  allopurinol  300 mg Per Tube Daily   amLODipine  10 mg Per Tube Daily   aspirin  81 mg Per Tube Daily   Chlorhexidine Gluconate Cloth  6 each Topical Daily   feeding supplement (PROSource TF)  45 mL Per Tube BID   folic acid  1 mg Per Tube Daily   free water  200 mL Per Tube Q4H   metaxalone  400 mg Per Tube TID   multivitamin with minerals  1 tablet Per Tube Daily   rifampin  300 mg Oral Q12H   thiamine injection  100 mg Intravenous Daily   Continuous Infusions:  dextrose     feeding supplement (OSMOLITE 1.2 CAL) 1,000 mL (06/25/21 1636)   Nafcillin 12g infusion in dextrose 5% 500 mL 20.8 mL/hr at 06/25/21 1639    Principal Problem:   Abscess in epidural space of cervical spine Active Problems:   Fall   Alcoholic hepatitis   Altered mental status   Acute delirium   Sepsis ([place])   Malnutrition of moderate degree   Consultants: Neurology Psychiatry Infectious disease Neurosurgery  Procedures: Echocardiogram Cervical discectomy and fusion on 06/16/2021  Antibiotics: Acyclovir on 8/5 and 8/6 Ampicillin 8/5 through 8/8 Ceftriaxone 8/5 through 8/9 Vancomycin 8/6 through 8/8 Nafcillin 8/9 >> Rifampin 8/12 >>   Time spent: 45 minutes    Erin Hearing ANP  [facility] 7 am - 330 pm/M-F for direct patient care and secure chat Please refer to Amion for contact info 15  days

## 2021-06-27 ENCOUNTER — Inpatient Hospital Stay (HOSPITAL_COMMUNITY): Payer: Medicare Other

## 2021-06-27 DIAGNOSIS — R040 Epistaxis: Secondary | ICD-10-CM

## 2021-06-27 LAB — CBC
HCT: 26.1 % — ABNORMAL LOW (ref 36.0–46.0)
Hemoglobin: 8 g/dL — ABNORMAL LOW (ref 12.0–15.0)
MCH: 31.9 pg (ref 26.0–34.0)
MCHC: 30.7 g/dL (ref 30.0–36.0)
MCV: 104 fL — ABNORMAL HIGH (ref 80.0–100.0)
Platelets: 106 10*3/uL — ABNORMAL LOW (ref 150–400)
RBC: 2.51 MIL/uL — ABNORMAL LOW (ref 3.87–5.11)
RDW: 15.7 % — ABNORMAL HIGH (ref 11.5–15.5)
WBC: 7.6 10*3/uL (ref 4.0–10.5)
nRBC: 0 % (ref 0.0–0.2)

## 2021-06-27 LAB — HEMOGLOBIN AND HEMATOCRIT, BLOOD
HCT: 21.6 % — ABNORMAL LOW (ref 36.0–46.0)
HCT: 22.4 % — ABNORMAL LOW (ref 36.0–46.0)
Hemoglobin: 6.9 g/dL — CL (ref 12.0–15.0)
Hemoglobin: 7.1 g/dL — ABNORMAL LOW (ref 12.0–15.0)

## 2021-06-27 LAB — BASIC METABOLIC PANEL
Anion gap: 7 (ref 5–15)
BUN: 67 mg/dL — ABNORMAL HIGH (ref 8–23)
CO2: 25 mmol/L (ref 22–32)
Calcium: 8 mg/dL — ABNORMAL LOW (ref 8.9–10.3)
Chloride: 120 mmol/L — ABNORMAL HIGH (ref 98–111)
Creatinine, Ser: 1.47 mg/dL — ABNORMAL HIGH (ref 0.44–1.00)
GFR, Estimated: 37 mL/min — ABNORMAL LOW (ref >60)
Glucose, Bld: 115 mg/dL — ABNORMAL HIGH (ref 70–99)
Potassium: 3.3 mmol/L — ABNORMAL LOW (ref 3.5–5.1)
Sodium: 152 mmol/L — ABNORMAL HIGH (ref 135–145)

## 2021-06-27 LAB — IRON AND TIBC
Iron: 29 ug/dL (ref 28–170)
Saturation Ratios: 20 % (ref 10.4–31.8)
TIBC: 148 ug/dL — ABNORMAL LOW (ref 250–450)
UIBC: 119 ug/dL

## 2021-06-27 LAB — RETICULOCYTES
Immature Retic Fract: 7.8 % (ref 2.3–15.9)
RBC.: 2.43 MIL/uL — ABNORMAL LOW (ref 3.87–5.11)
Retic Count, Absolute: 43.5 10*3/uL (ref 19.0–186.0)
Retic Ct Pct: 1.8 % (ref 0.4–3.1)

## 2021-06-27 LAB — FOLATE: Folate: 33.9 ng/mL (ref >5.9)

## 2021-06-27 LAB — FERRITIN: Ferritin: 745 ng/mL — ABNORMAL HIGH (ref 11–307)

## 2021-06-27 LAB — GLUCOSE, CAPILLARY
Glucose-Capillary: 96 mg/dL (ref 70–99)
Glucose-Capillary: 137 mg/dL — ABNORMAL HIGH (ref 70–99)
Glucose-Capillary: 121 mg/dL — ABNORMAL HIGH (ref 70–99)

## 2021-06-27 LAB — VITAMIN B12: Vitamin B-12: 402 pg/mL (ref 180–914)

## 2021-06-27 IMAGING — CT CT ABDOMEN W/O CM
2 of 4 series · 15 of 46 positions shown, 17 images · non-contrast
Comparison: None.

CLINICAL DATA: assess anatomy for gastrostomy tube

EXAM:
CT ABDOMEN WITHOUT CONTRAST
TECHNIQUE: Multidetector CT imaging of the abdomen was performed following the
standard protocol without IV contrast.

[Series 3: a/p w/o 5mm · axial · non-contrast · 0.77mm/px · z∈[+859,+1114]mm · 12 of 57 slices shown, 14 images]
[im 3/57  soft-tissue]
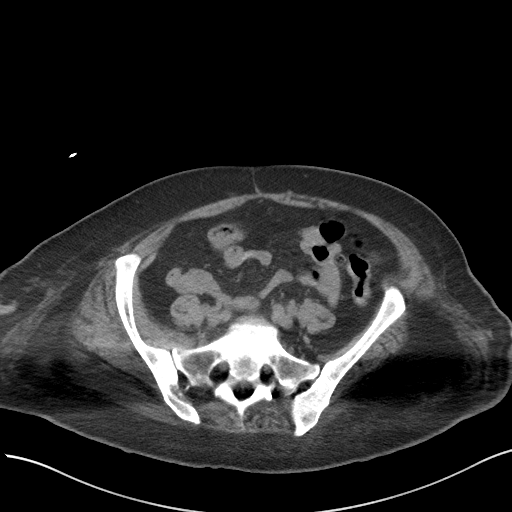
[im 3/57  bone]
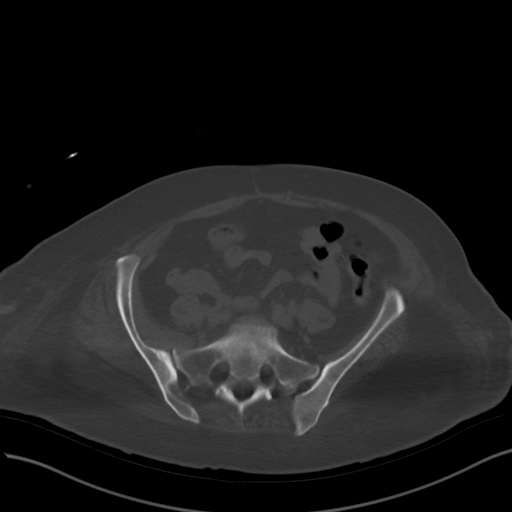
[im 8/57  soft-tissue]
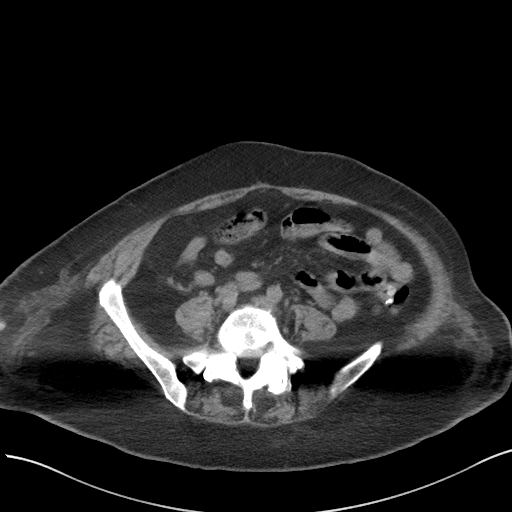
[im 13/57  soft-tissue]
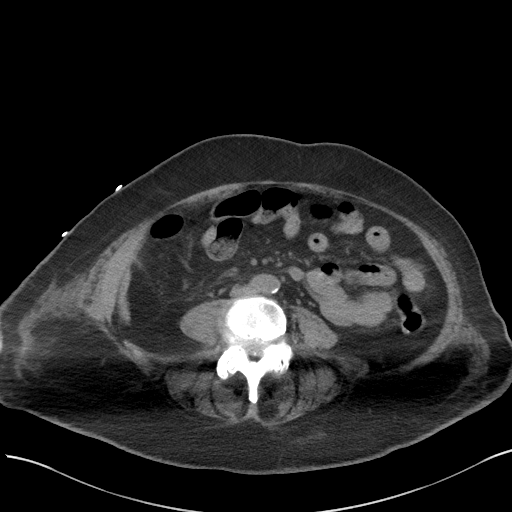
[im 18/57  soft-tissue]
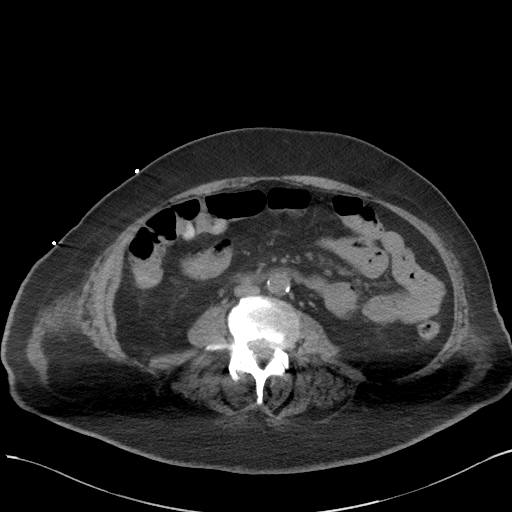
[im 22/57  soft-tissue]
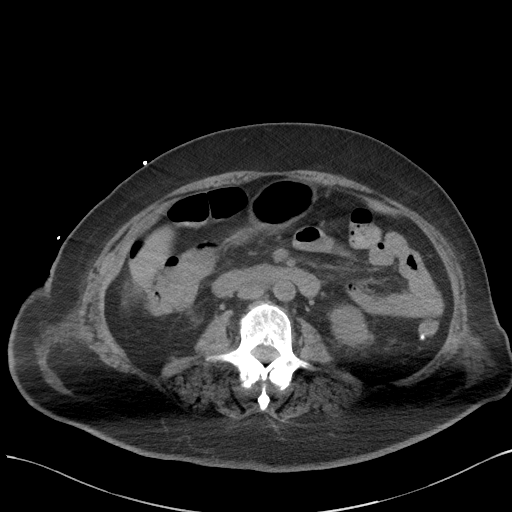
[im 27/57  soft-tissue]
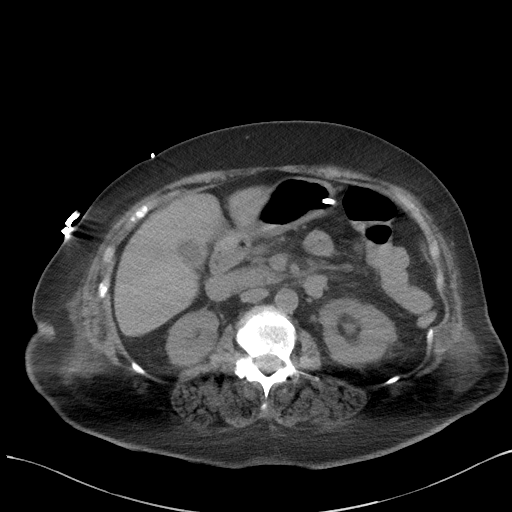
[im 30/57  soft-tissue]
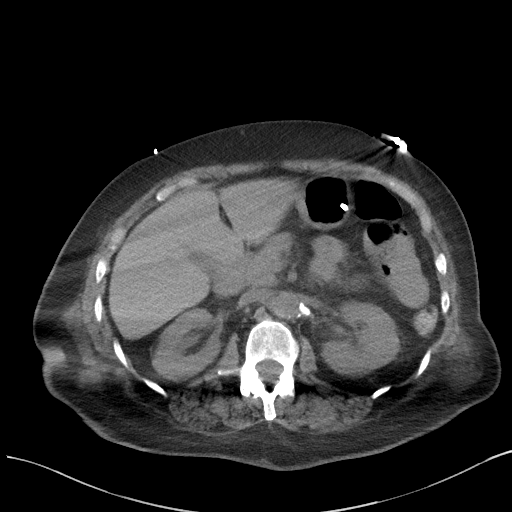
[im 35/57  soft-tissue]
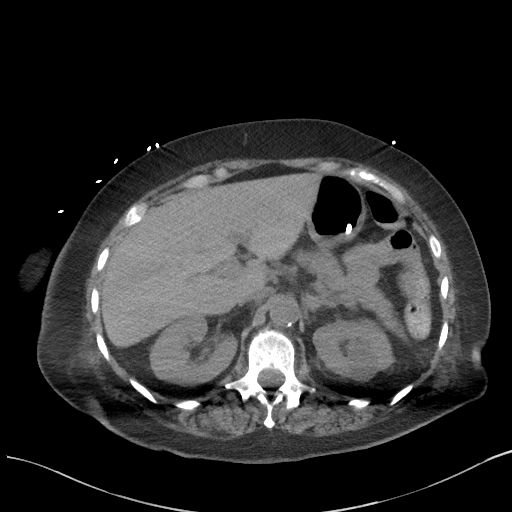
[im 39/57  soft-tissue]
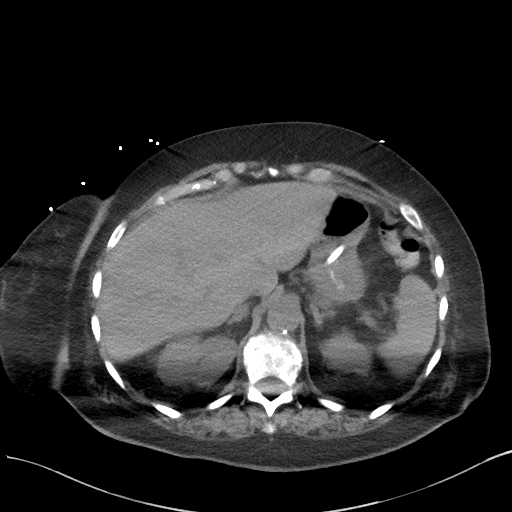
[im 39/57  bone]
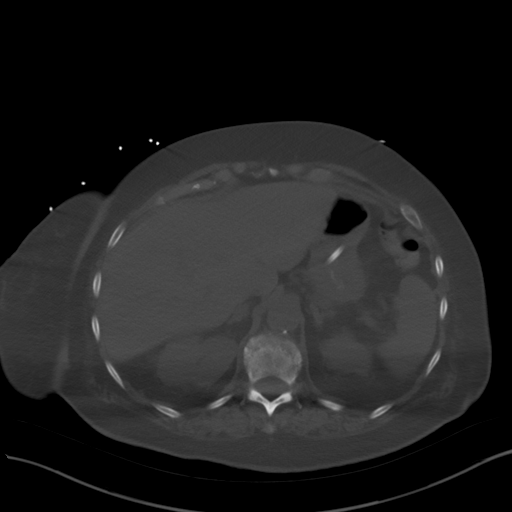
[im 44/57  soft-tissue]
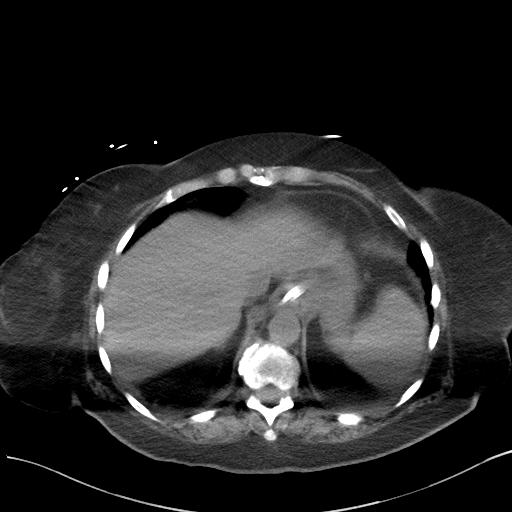
[im 49/57  soft-tissue]
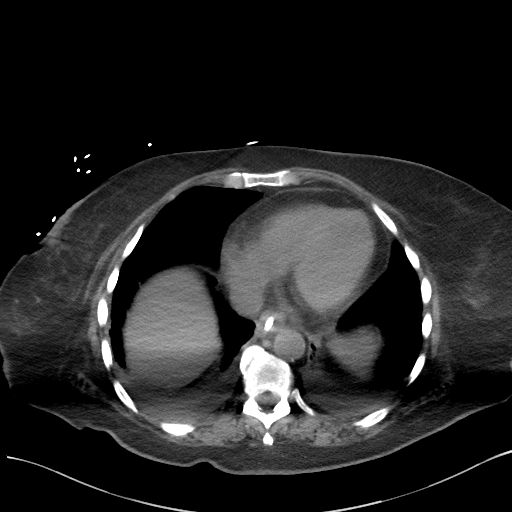
[im 54/57  soft-tissue]
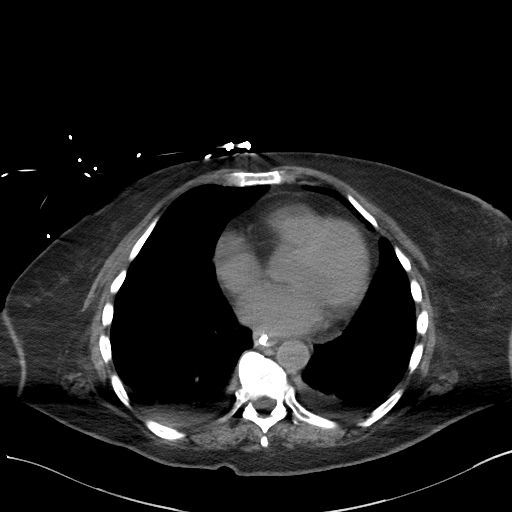

[Series 6: a/p w/o cor · coronal · non-contrast · 0.55mm/px · 3 of 130 slices shown]
[im 44/130  soft-tissue]
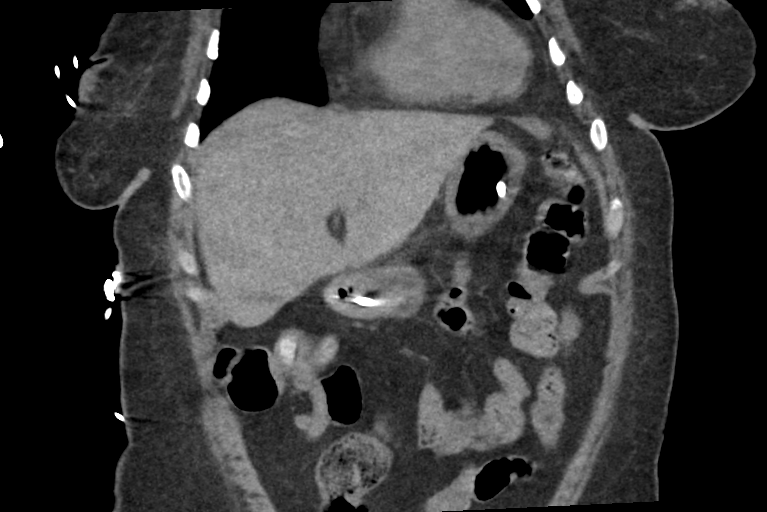
[im 58/130  soft-tissue]
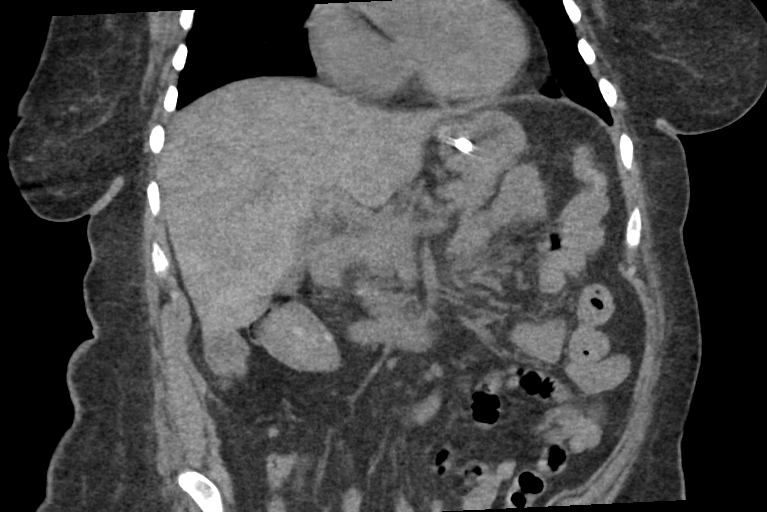
[im 72/130  soft-tissue]
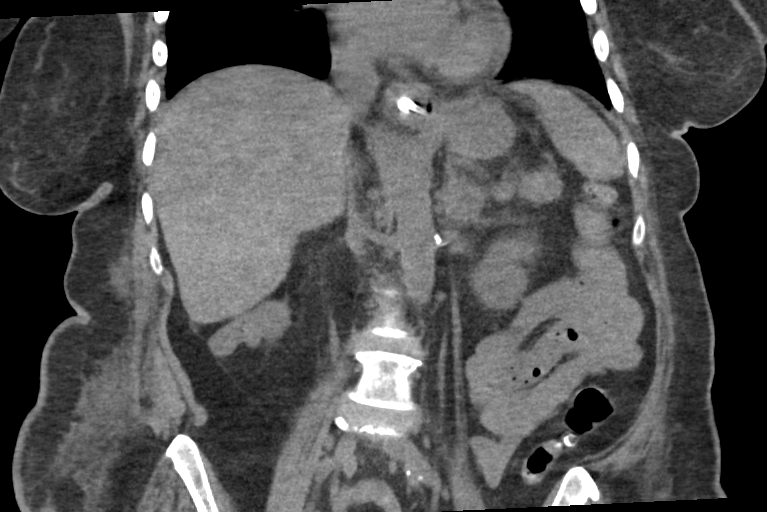

[15 of 46 positions shown; findings below may reference images not displayed]

FINDINGS: Lower chest: Small bilateral pleural effusions with bibasilar
atelectasis.

Hepatobiliary: No focal liver abnormality is seen. No gallstones,
gallbladder wall thickening, or biliary dilatation.

Pancreas: Unremarkable. No pancreatic ductal dilatation or
surrounding inflammatory changes.

Spleen: Normal in size without focal abnormality.

Adrenals/Urinary Tract: Adrenal glands are unremarkable. No
hydronephrosis. There is a 3.0 cm low-density lesion in the left
kidney which is compatible with a cyst.

Stomach/Bowel: The stomach is within normal limits. There is a
feeding tube in place with tip in the distal stomach. There is no
other bowel interposed between the stomach and the abdominal wall,
though the transverse colon is in close proximity. There is no
evidence of bowel obstruction. The appendix is normal. Scattered
colonic diverticuli. No bowel wall thickening or adjacent
inflammatory changes.

Vascular/Lymphatic: Aortoiliac atherosclerotic calcifications. No
AAA. No lymphadenopathy.

Other: No abdominal wall hernia or abnormality. No abdominal
ascites.

Musculoskeletal: No acute osseous abnormality. No suspicious lytic
or blastic lesions. Multilevel degenerative changes of the spine,
with severe lower lumbar predominant facet arthropathy. Likely
chronic right SI joint subchondral erosive change, with sclerotic
margins.
IMPRESSION: No acute abnormality in the abdomen. Feeding tube is in place with
tip in the distal stomach. No other bowel is interposed between the
stomach and the anterior abdominal wall, though the transverse colon
is in close proximity.

Small bilateral pleural effusions with adjacent bibasilar
atelectasis.

## 2021-06-27 MED ORDER — OSMOLITE 1.2 CAL PO LIQD
1000.0000 mL | ORAL | Status: DC
Start: 2021-06-27 — End: 2021-07-03
  Administered 2021-06-27 – 2021-07-01 (×4): 1000 mL
  Filled 2021-06-27 (×11): qty 1000

## 2021-06-27 MED ORDER — OXYMETAZOLINE HCL 0.05 % NA SOLN
2.0000 | Freq: Two times a day (BID) | NASAL | Status: AC
  Administered 2021-06-27 – 2021-06-29 (×4): 2 via NASAL
  Filled 2021-06-27: qty 30

## 2021-06-27 MED ORDER — DICLOFENAC SODIUM 1 % EX GEL
4.0000 g | Freq: Four times a day (QID) | CUTANEOUS | Status: DC
  Administered 2021-06-27 – 2021-07-11 (×53): 4 g via TOPICAL
  Filled 2021-06-27: qty 100

## 2021-06-27 NOTE — TOC Progression Note (Signed)
Transition of Care [facility]) - Progression Note    Patient Details  Name: Kristy [name] MRN: [id number] Date of Birth: 11/16/1945  [facility]) CM/SW Contact  [name], RN Phone Number: 06/27/2021, 9:56 AM  Clinical Narrative:    Case management called and spoke with the patient's daughter, [name] on the phone regarding transitions of care.  The patient's daughter explained that the patient has been declining in health in the last 1.5 years and is concerned regarding her mother's failing health.  She states that the patient's husband just retired 1 month ago and the patient was covered by a Pension scheme manager up until that point of time.  The daughter, [name] is in the process of calling Medicare to sign the patient up for additional coverage.  The patient lives at home with her husband but the daughter states that the patient's husband has short term memory issues and she is assisting them at this time for medical and financial decision making.  I discussed with the daughter on the phone and offered her Medicare choice regarding SNf placement and current bed offers available to the patient.  The daughter will follow up with me regarding bed offers later this week.  The patient is not medically stable for discharge at this point but the daughter is available to assist with discharging planning and permission for medical needs.  CM and MSW will continue to follow the patient for transitions of care needs.   Expected Discharge Plan: Skilled Nursing Facility Barriers to Discharge: Continued Medical Work up  Expected Discharge Plan and Services Expected Discharge Plan: Skilled Nursing Facility In-house Referral: Clinical Social Work Discharge Planning Services: CM Consult Post Acute Care Choice: Skilled Nursing Facility Living arrangements for the past 2 months: Single Family Home                                       Social Determinants of  Health (SDOH) Interventions    Readmission Risk Interventions No flowsheet data found.

## 2021-06-27 NOTE — Progress Notes (Signed)
[facility] PROGRESS NOTE  ZULEY LUTTER MAY:[id number] DOB: [birth year] DOA: 06/10/2021 PCP: Lujean Amel, MD  Status: Remains inpatient appropriate because:Persistent severe electrolyte disturbances, Altered mental status, Unsafe d/c plan, IV treatments appropriate due to intensity of illness or inability to take PO, and Inpatient level of care appropriate due to severity of illness  Dispo: The patient is from: Home              Anticipated d/c is to: SNF              Patient currently is not medically stable to d/c.   Difficult to place patient No    Level of care: Progressive  Code Status: Full Family Communication:  DVT prophylaxis: Changed to SQ heparin given estimated GFR 29 COVID vaccination status: [place] 02/06/2020 and 02/28/2020; will need booster once has improved from a neurological standpoint  Foley catheter: Yes POA: No Date inserted: 06/16/21 Still needed?/Rationale: Yes-accurate I's/O while hydrating for AKI and hypernatremia  HPI: 75 y.o. female with a history of alcohol use who presented to the ED for frequent falls, generalized weakness, and confusion. She was found to be afebrile, hypertensive, and without hypoxia. Also had hypokalemia (K 2.6), SCr 1.88 (above baseline ~1.5), LFT elevation (AST 576, ALT 305). Ammonia normal. EtOH, salicylate, tylenol levels negative. CT head and cervical spine were reassuring, though the patient was unable to rise to seated position from laying prompting consultation for admission. The patient was given potassium supplementation, started on CIWA protocol, and admitted with note made of continued AMS. She has remained weak and confused, though on 8/5 became less interactive, retaining urine, WBC noted to be rising. Blood and urine cultures ordered, ceftriaxone given with bacteriuria and urinary retention. MR brain has not been done yet. Neurology consulted with worsening mental status and nuchal rigidity. LP under fluoro requested,  empirically treating meningitis/encephalitis.   Subjective: Awakens easily.  Reporting less neck discomfort after initiation of Skelaxin.  I did inform her that at this point it would be better to transition to a temporary gastrostomy tube until she gains enough strength to be able to eat independently.  Objective: Vitals:   06/26/21 2307 06/27/21 0249  BP: 127/69 116/74  Pulse: 100   Resp:    Temp: 98.4 F (36.9 C) 98.3 F (36.8 C)  SpO2: 99%     Intake/Output Summary (Last 24 hours) at 06/27/2021 0751 Last data filed at 06/26/2021 2300 Gross per 24 hour  Intake 4854.45 ml  Output 2600 ml  Net 2254.45 ml   Filed Weights   06/22/21 0231 06/24/21 0400 06/25/21 0336  Weight: 79.7 kg 78.6 kg 78.8 kg    Exam: Constitutional: NAD, calm, much less discomfort with attempts to reposition head towards left but still remains quite stiff ENT: Evidence of old blood in both nares no active bleeding Respiratory: Lung sounds are clear to auscultation anteriorly she remained, stable on room air w/ saturations 98 to 99%.  Normal respiratory effort. No accessory muscle use.  Cardiovascular: Regular rate and rhythm, normotensive.  Focal edema of upper extremities. 2+ pedal pulses.  Abdomen: no tenderness, no masses palpated. Cortrak in place for enteral feedings.  Bowel sounds positive. LBM 8/16 Genitourinary: Foley catheter in place with amber-colored urine noted in bedside bag Musculoskeletal: Still has preferential positioning of head and neck to the right.  Still has contracture but much less pain with PROM applied by caregivers Skin: no rashes, lesions, ulcers. No induration Neurologic: CN 2-12 grossly intact. Sensation intact,  Strength 2/5 x all 4 extremities.  Psychiatric: Awake and oriented x 3. Normal mood.    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;  UTI (POA);  - Neuro, ID, and Neurosurgery consulted -TTE unremarkable for overt vegetation - ID initially concerned for possible systemic or central etiology given multiple sites of staph infection -S/P I/D on  06/16/21 - noted copious amounts of pus in the longus colli with myositis, discitis, osteomyelitis, large epidural abscess s/p drainage cultures positive for MSSA- Staph Aureus per cultures - [place] dosed 8/07 - 06/30/21 FOLLOWED BY Cefazolin pharmacy dosed 8/22 - 08/11/2021 - Rifampin 600 mg BID to be continued throughout the treatment as she can tolerate for 8 weeks; End Dates: 8/21 for nafcillin; 10/02 for cefazolin   -Given underlying CKD 3B uncertain if patient would be an appropriate candidate for PICC line -8/17 added low-dose Skelaxin TID decreased pain and neck spasm although still maintains right preferential head positioning.  Already has narcotics and Tylenol available for pain  Acute metabolic encephalopathy secondary to sepsis physiology and persistent hypernatremia/associated profound dysphagia - Neurology consulted - bedside LP unable to be performed  - MRI remarkable for osteo/epidural abscess as above -SLP following and given altered mentation and overall weakness unable to allow oral intake at this time noting patient unable to swallow adequately -cortrack for feedings and free water/meds and as of 8/18 have consulted IR for PEG tube placement  Epistaxis -Had epistaxis overnight but unable to achieve hemostasis with conservative measures (Gore-Tex packing) -ENT/Dr. Lucia Gaskins evaluated patient and cauterized bleeding vessel in right anterior nostril with silver nitrate, aspirin and heparin stopped. -Yesterday's hemoglobin prior to epistaxis was 7.9 which had decreased from 9.8 on 8/15-hemoglobin this a.m. 8.0-follow H/H every 6 hours x 4 and transfuse only if hemoglobin </= 7.0  Macrocytosis /anemia of critical illness as well as associated chronic kidney  disease -8/17 hemoglobin 7.9 (?  Hemodilution)-on 8/18 even after recent epistaxis hemoglobin 8.0 -Following H/H every 6 hours given recent epistaxis -Consider initiating Aranesp or other form of erythropoietin  Thrombocytopenia  -platelets 140,000 on 8/9 and decreased to 110,000 by  8/11 -8/17 platelets 105,000 and on 8/18 platelets 106,000 -Aspirin and heparin discontinued in context of epistaxis and likely until platelets fully recover to greater than 150,000 it is unlikely we will be able to resume anticoagulants for DVT prophylax   Hypernatremic/hyperchloremic, improving -Likely secondary to previous IV fluid boluses and concurrent IV medications  -Appears very dry noting continues to have elevated BUN and abnormal creatinine, low EGFR and sodium remains elevated at 159 (ECHO 8/9 also consistent with volume depletion noting CVP 3)-8/18 sodium has decreased to 152 -Continue D5 half-normal saline at 100 cc/h -Patient also has third spacing of fluid and her albumin is less than 1.0 so we will give a trial of albumin infusion on 8/17  AKI on stage IIIb CKD, stabilizing:  -Baseline renal function: BUN 24 and creatinine 1.5 with he GFR slightly greater than 30 -8/17: BUN 77 and creatinine 1.73 and after initiation of IV fluids and increased free water on 8/18 BUN 67 and creatinine 1.47 - Monitor renal function closely with initiation of vancomycin, acyclovir. -Urine output variable between 400 cc per shift to up to 2800 cc per shift in the past 7 to 10 days   UTI, POA, resolved:  - Covered by above abx - treatment completed   Ambulatory dysfunction with fall  at home, severe weakness:  - Secondary to above - PT/OT currently recommending SNF rehabilitation once acute infectious process under better control -    Alcoholic hepatitis:  - Based on AST predominance of LFT elevations. Hepatitis panel nonreactive.  Family confirms that patient drank 2 beers per day prior to admission - No  indication for prednisolone at this time. Continue trending. - AST/ALT downtrending - bilirubin increased marginally    Alcohol abuse:  - Per family - 2 beers per day of late, etoh level negative in ED.  - Cessation counseling to be repeated once pt more clear-headed. - Empirically supplementing thiamine, folate, MVM.   Hypokalemia: -8/16 potassium was low at 3.1 and after repletion has increased to 3.5 -Have added low-dose potassium to maintenance fluids -Begin potassium per tube daily   Severe protein caloric malnutrition  -Continue tube feeds and free water flushes via cortrack - Albumin continues to downtrend despite increased tube feeds we will give 4 doses of albumin as above Nutrition Problem: Moderate Malnutrition Etiology: chronic illness Signs/Symptoms: moderate muscle depletion, moderate fat depletion, mild muscle depletion, mild fat depletion Interventions: Prostat, Tube feeding  HTN/grade 1 diastolic dysfunction: -Hold ARB given AKI -Continue Norvasc -Echo this admission with an EF of 65 to 70% with right 1 diastolic physiology, also noted with mild MR and AR   HLD: - Hold statin for now; AST has decreased to 51 and ALT normal at 38 total bili slightly elevated at 1.3      Data Reviewed: Basic Metabolic Panel: Recent Labs  Lab 06/23/21 0511 06/23/21 1622 06/25/21 0329 06/26/21 0753 06/27/21 0214  NA 151* 154* 156* 159* 152*  K 4.2 3.0* 3.1* 3.5 3.3*  CL 121* 122* 121* 125* 120*  CO2 '22 24 26 26 25  ' GLUCOSE 158* 146* 147* 150* 115*  BUN 85* 83* 82* 77* 67*  CREATININE 1.95* 1.94* 1.81* 1.73* 1.47*  CALCIUM 7.7* 8.0* 8.4* 8.3* 8.0*   Liver Function Tests: Recent Labs  Lab 06/23/21 0511  AST 51*  ALT 38  ALKPHOS 105  BILITOT 4.7*  PROT 4.6*  ALBUMIN <1.0*   No results for input(s): LIPASE, AMYLASE in the last 168 hours. No results for input(s): AMMONIA in the last 168 hours. CBC: Recent Labs  Lab 06/21/21 1010 06/22/21 0550 06/24/21 1042  06/26/21 0753 06/27/21 0543  WBC 13.3* 10.7* 9.9 9.7 7.6  NEUTROABS 11.8*  --   --   --   --   HGB 10.8* 9.4* 9.8* 7.9* 8.0*  HCT 34.6* 29.5* 30.7* 25.5* 26.1*  MCV 103.0* 102.1* 102.7* 104.1* 104.0*  PLT 97* 92* 105* 113* 106*   Cardiac Enzymes: No results for input(s): CKTOTAL, CKMB, CKMBINDEX, TROPONINI in the last 168 hours. BNP (last 3 results) No results for input(s): BNP in the last 8760 hours.  ProBNP (last 3 results) No results for input(s): PROBNP in the last 8760 hours.  CBG: Recent Labs  Lab 06/26/21 1240 06/26/21 1658 06/26/21 1923 06/26/21 2302 06/27/21 0255  GLUCAP 163* 140* 139* 138* 96    No results found for this or any previous visit (from the past 240 hour(s)).    Studies: No results found.  Scheduled Meds:  allopurinol  300 mg Per Tube Daily   amLODipine  10 mg Per Tube Daily   Chlorhexidine Gluconate Cloth  6 each Topical Daily   folic acid  1 mg Per Tube Daily   free water  200 mL Per Tube Q4H   metaxalone  400 mg Per  Tube TID   multivitamin with minerals  1 tablet Per Tube Daily   oxymetazoline  2 spray Each Nare BID   rifampin  300 mg Oral Q12H   thiamine injection  100 mg Intravenous Daily   Continuous Infusions:  dextrose 5 % and 0.45 % NaCl with KCl 20 mEq/L 100 mL/hr at 06/26/21 2331   dextrose     Nafcillin 12g infusion in dextrose 5% 500 mL 20.8 mL/hr at 06/26/21 1705    Principal Problem:   Abscess in epidural space of cervical spine Active Problems:   Fall   Alcoholic hepatitis   Altered mental status   Acute delirium   Sepsis ([place])   Malnutrition of moderate degree   Consultants: Neurology Psychiatry Infectious disease Neurosurgery  Procedures: Echocardiogram Cervical discectomy and fusion on 06/16/2021  Antibiotics: Acyclovir on 8/5 and 8/6 Ampicillin 8/5 through 8/8 Ceftriaxone 8/5 through 8/9 Vancomycin 8/6 through 8/8 Nafcillin 8/9 >> Rifampin 8/12 >>   Time spent: 45 minutes    Erin Hearing  ANP  [facility] 7 am - 330 pm/M-F for direct patient care and secure chat Please refer to Amion for contact info 16  days

## 2021-06-27 NOTE — Consult Note (Signed)
Reason for Consult: Epistaxis Referring Physician: Covering physician  Kristy [name] is an 75 y.o. female.  HPI: Patient has been hospitalized for several days and has a longstanding feeding tube in the left nostril.  She takes a baby aspirin and subcu heparin for DVT prophylaxis.  She started to bleeding mostly from the right nostril earlier this evening sometime before midnight.  This was controlled with Afrin initially but she continued to have oozing with blood coming from both nostrils and blood clot on both sides.  They also suction a small amount of blood from her throat.  I was asked to come and evaluate the persistent epistaxis.  History reviewed. No pertinent past medical history.  Past Surgical History:  Procedure Laterality Date   ANTERIOR CERVICAL DECOMP/DISCECTOMY FUSION N/A 06/16/2021   Procedure: ANTERIOR CERVICAL DISCECTOMY FUSION Cervical five -Cervical six, Evacuation of Epidural Abscess;  Surgeon: Jadene Pierini, MD;  Location: [facility] OR;  Service: Neurosurgery;  Laterality: N/A;    Social History:  has no history on file for tobacco use, alcohol use, and drug use.  Allergies: No Known Allergies  Medications: I have reviewed the patient's current medications.  Results for orders placed or performed during the hospital encounter of 06/10/21 (from the past 48 hour(s))  Glucose, capillary     Status: Abnormal   Collection Time: 06/25/21  7:50 AM  Result Value Ref Range   Glucose-Capillary 136 (H) 70 - 99 mg/dL    Comment: Glucose reference range applies only to samples taken after fasting for at least 8 hours.  Glucose, capillary     Status: Abnormal   Collection Time: 06/25/21 12:33 PM  Result Value Ref Range   Glucose-Capillary 124 (H) 70 - 99 mg/dL    Comment: Glucose reference range applies only to samples taken after fasting for at least 8 hours.  Glucose, capillary     Status: Abnormal   Collection Time: 06/25/21  4:24 PM  Result Value Ref Range    Glucose-Capillary 154 (H) 70 - 99 mg/dL    Comment: Glucose reference range applies only to samples taken after fasting for at least 8 hours.  Glucose, capillary     Status: Abnormal   Collection Time: 06/25/21  7:51 PM  Result Value Ref Range   Glucose-Capillary 127 (H) 70 - 99 mg/dL    Comment: Glucose reference range applies only to samples taken after fasting for at least 8 hours.  Glucose, capillary     Status: Abnormal   Collection Time: 06/25/21 11:56 PM  Result Value Ref Range   Glucose-Capillary 108 (H) 70 - 99 mg/dL    Comment: Glucose reference range applies only to samples taken after fasting for at least 8 hours.  Glucose, capillary     Status: Abnormal   Collection Time: 06/26/21  4:16 AM  Result Value Ref Range   Glucose-Capillary 107 (H) 70 - 99 mg/dL    Comment: Glucose reference range applies only to samples taken after fasting for at least 8 hours.  CBC     Status: Abnormal   Collection Time: 06/26/21  7:53 AM  Result Value Ref Range   WBC 9.7 4.0 - 10.5 K/uL   RBC 2.45 (L) 3.87 - 5.11 MIL/uL   Hemoglobin 7.9 (L) 12.0 - 15.0 g/dL   HCT 85.0 (L) 27.7 - 41.2 %   MCV 104.1 (H) 80.0 - 100.0 fL   MCH 32.2 26.0 - 34.0 pg   MCHC 31.0 30.0 - 36.0 g/dL  RDW 15.4 11.5 - 15.5 %   Platelets 113 (L) 150 - 400 K/uL    Comment: Immature Platelet Fraction may be clinically indicated, consider ordering this additional test [id number] PLATELET COUNT CONFIRMED BY SMEAR    nRBC 0.0 0.0 - 0.2 %    Comment: Performed at [facility], [address] [place], [place] [address]  Basic metabolic panel     Status: Abnormal   Collection Time: 06/26/21  7:53 AM  Result Value Ref Range   Sodium 159 (H) 135 - 145 mmol/L   Potassium 3.5 3.5 - 5.1 mmol/L   Chloride 125 (H) 98 - 111 mmol/L   CO2 26 22 - 32 mmol/L   Glucose, Bld 150 (H) 70 - 99 mg/dL    Comment: Glucose reference range applies only to samples taken after fasting for at least 8 hours.   BUN 77 (H) 8 - 23 mg/dL    Creatinine, Ser 6.76 (H) 0.44 - 1.00 mg/dL   Calcium 8.3 (L) 8.9 - 10.3 mg/dL   GFR, Estimated 30 (L) >60 mL/min    Comment: (NOTE) Calculated using the CKD-EPI Creatinine Equation (2021)    Anion gap 8 5 - 15    Comment: Performed at [facility], [address] [place], [place] [address]  Glucose, capillary     Status: Abnormal   Collection Time: 06/26/21  7:57 AM  Result Value Ref Range   Glucose-Capillary 133 (H) 70 - 99 mg/dL    Comment: Glucose reference range applies only to samples taken after fasting for at least 8 hours.  Glucose, capillary     Status: Abnormal   Collection Time: 06/26/21 12:40 PM  Result Value Ref Range   Glucose-Capillary 163 (H) 70 - 99 mg/dL    Comment: Glucose reference range applies only to samples taken after fasting for at least 8 hours.  Glucose, capillary     Status: Abnormal   Collection Time: 06/26/21  4:58 PM  Result Value Ref Range   Glucose-Capillary 140 (H) 70 - 99 mg/dL    Comment: Glucose reference range applies only to samples taken after fasting for at least 8 hours.  Glucose, capillary     Status: Abnormal   Collection Time: 06/26/21  7:23 PM  Result Value Ref Range   Glucose-Capillary 139 (H) 70 - 99 mg/dL    Comment: Glucose reference range applies only to samples taken after fasting for at least 8 hours.  Glucose, capillary     Status: Abnormal   Collection Time: 06/26/21 11:02 PM  Result Value Ref Range   Glucose-Capillary 138 (H) 70 - 99 mg/dL    Comment: Glucose reference range applies only to samples taken after fasting for at least 8 hours.  Basic metabolic panel     Status: Abnormal   Collection Time: 06/27/21  2:14 AM  Result Value Ref Range   Sodium 152 (H) 135 - 145 mmol/L   Potassium 3.3 (L) 3.5 - 5.1 mmol/L   Chloride 120 (H) 98 - 111 mmol/L   CO2 25 22 - 32 mmol/L   Glucose, Bld 115 (H) 70 - 99 mg/dL    Comment: Glucose reference range applies only to samples taken after fasting for at least 8 hours.   BUN  67 (H) 8 - 23 mg/dL   Creatinine, Ser 3.26 (H) 0.44 - 1.00 mg/dL   Calcium 8.0 (L) 8.9 - 10.3 mg/dL   GFR, Estimated 37 (L) >60 mL/min    Comment: (  NOTE) Calculated using the CKD-EPI Creatinine Equation (2021)    Anion gap 7 5 - 15    Comment: Performed at [facility], [address], [place], [place] [address]  Glucose, capillary     Status: None   Collection Time: 06/27/21  2:55 AM  Result Value Ref Range   Glucose-Capillary 96 70 - 99 mg/dL    Comment: Glucose reference range applies only to samples taken after fasting for at least 8 hours.    No results found.  ROS: Otherwise negative with no significant history of previous epistaxis.   PE: Patient had a large amount of blood clot in the right nostril that was removed with suction as well as patient blowing her nose.  After removal of the blood clot she had bleeding predominantly from a vessel anteriorly on the lateral portion of the nose the anterior portion of the inferior turbinate that was cauterized with silver nitrate.  She also had a small bleeding area from the septal side opposite this that was also cauterized with silver nitrate.  There was no active bleeding noted on the left side where she has her NG tube in place but it is difficult to adequately visualize the nasal cavity as the NG is in the way but after cauterization of the right side the bleeding seemed to stop.  Oral exam revealed no active bleeding in the oropharynx and patient was able to breathe well and blow her nose without blood. At conclusion of the cauterization I did put a anterior cottonball packing in the right nostril.  Assessment/Plan: Epistaxis from the right anterior right nostril  This was cauterized using silver nitrate and then anterior cottonball packing was placed in the right nostril that I will remove tomorrow provided she has no further bleeding. Would recommend stopping the aspirin and heparin until the bleeding has stopped for 48hours.   Might also check the coag studies as there is no reported trauma to the right nostril that might of initiated the bleeding and she had 2 sites that were bleeding. I will follow-up tomorrow to recheck on the patient.  Dillard Cannon 06/27/2021, 3:43 AM

## 2021-06-27 NOTE — Progress Notes (Signed)
  Speech Language Pathology Treatment: Dysphagia  Patient Details Name: Kristy [name] MRN: [id number] DOB: February 28, 1946 Today's Date: 06/27/2021 Time: 0177-9390 SLP Time Calculation (min) (ACUTE ONLY): 21 min  Assessment / Plan / Recommendation Clinical Impression  Pt with improved alertness this am, engaging in simple conversation with clinician, though tangential in nature. Pt able to obtain a semi- upright position and oral care was provided prior to trials of ice chips. During oral care, spontaneous cough elicited and blood mixed with secretions brought to oral cavity for suctioning. RN reports pt's nose bleeding earlier this am. Pt only able to sustain eye opening briefly, but orally accepted ice chips demonstrating intermittent oral manipulation between periods of holding. Delayed cough response noted in 2/3 trials. She required max verbal and tactile cues to orally manipulate bolus and remain alert/attention during periods of lethargy and fleeting attention. It is recommended that pt remain NPO with continued SLP f/u for PO trials. She may continue to have ice chips after oral care when alert.    HPI HPI: Pt is a 75yo female presenting to [facility] ED on 8/1 after a fall on 7/29. Pt also fell on 8/2 while in the ED. Pt found to be hypokalemic upon admission. 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 Pt is now s/p drainage of abscess and C5-6 ACDF on 06/16/21. Also noted R SI septic arthritis.  PMH: HTN, polycthemia, alcohol use.      SLP Plan  Continue with current plan of care       Recommendations  Diet recommendations: NPO;Other(comment) (ice chips after oral care)                Oral Care Recommendations: Oral care QID;Staff/trained caregiver to provide oral care Follow up Recommendations: Skilled Nursing facility SLP Visit Diagnosis: Dysphagia, pharyngeal phase (R13.13) Plan: Continue with current plan of  care       GO               Kristy Echevaria, MA, CCC-SLP Acute Rehabilitation Services Office Number: [phone]  Kristy [name] 06/27/2021, 12:13 PM

## 2021-06-27 NOTE — Progress Notes (Signed)
The pt has been having epistaxes tonight with no adequate hemostasis  with Gortex and large blood clot with persistent oozing necessitating ENT consult. Dr. Ezzard Standing graciously saw the pt and cauterized Rt anterior nostril bleeder with silver nitrate . We stopped asa and heparin and given drop of H and H type and screen was obtained . I attempted calling the family for consent for prbcs x1-2 units with no answer. Family can be contacted a gain after 7a for consent.

## 2021-06-27 NOTE — Progress Notes (Signed)
Physical Therapy Treatment Patient Details Name: Kristy [name] MRN: [id number] DOB: [birth year] Today's Date: 06/27/2021    History of Present Illness Pt is a 75yo female presenting to [facility] ED on 8/1 after a fall on 7/29. Pt also fell on 8/2 while in the ED. Pt found to be hypokalemic upon admission. 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 Pt is now s/p drainage of abscess and C5-6 ACDF on 06/16/21. Also noted R SI septic arthritis.  PMH: HTN, polycthemia, alcohol use.    PT Comments    Pt admitted with above diagnosis. Pt was able to only perform exercises in supine as nose began bleeding when HOB raised to 25 degrees. Pt did tolerate exercises with less pain today. Will continue PT.  Pt currently with functional limitations due to decr mobility due to pain and weakness. Pt will benefit from skilled PT to increase their independence and safety with mobility to allow discharge to the venue listed below.      Follow Up Recommendations  SNF;Supervision/Assistance - 24 hour     Equipment Recommendations  Other (comment) (TBA)    Recommendations for Other Services       Precautions / Restrictions Precautions Precautions: Fall;Cervical;Other (comment) Precaution Booklet Issued: No Precaution Comments: NG Restrictions Weight Bearing Restrictions: No    Mobility  Bed Mobility Overal bed mobility: Needs Assistance Bed Mobility: Rolling Rolling: +2 for physical assistance;Total assist   Supine to sit: Total assist     General bed mobility comments: Attempted to incline bed but when pt was only at 25 degrees, noted her left nostril began bleeding again. also noted she had sneezed out the packing out of the right nostril. notified nursing who messaged the MD.  Did bed exercises all 4's since bed could not be raised.    Transfers                 General transfer comment: unable  at this  time  Ambulation/Gait                 Stairs             Wheelchair Mobility    Modified Rankin (Stroke Patients Only)       Balance                                            Cognition Arousal/Alertness: Awake/alert Behavior During Therapy: Flat affect;Restless Overall Cognitive Status: Impaired/Different from baseline Area of Impairment: Orientation;Attention;Memory;Following commands;Safety/judgement;Awareness;Problem solving                 Orientation Level: Disoriented to;Time;Situation Current Attention Level: Sustained Memory: Decreased recall of precautions;Decreased short-term memory Following Commands: Follows one step commands inconsistently Safety/Judgement: Decreased awareness of safety;Decreased awareness of deficits Awareness: Intellectual Problem Solving: Slow processing;Decreased initiation;Difficulty sequencing;Requires verbal cues General Comments: Pt able to follow commands.   Pt with frequent moaning with  movment but seemed to tolerate ROM a little better today      Exercises General Exercises - Upper Extremity Shoulder Flexion: AAROM;Both;Supine Shoulder ABduction: Both;AAROM;PROM;10 reps Elbow Flexion: AAROM;Both;Supine Elbow Extension: AAROM;Both;Supine General Exercises - Lower Extremity Ankle Circles/Pumps: Both;10 reps;Supine;AROM Quad Sets: AROM;Both;5 reps;Supine Heel Slides: 10 reps;Both;Supine;AAROM Hip ABduction/ADduction: AAROM;Both;10 reps;Supine Other Exercises Other Exercises: head positioned to reduce cervical extension and right side flexion/rotation with head in good posiiton  in midline    General Comments General comments (skin integrity, edema, etc.): Encouraged cervical ROM adn positioning of head in better midline position      Pertinent Vitals/Pain Pain Assessment: Faces Faces Pain Scale: Hurts even more Pain Location: moaning/crying with minimal movement Pain Descriptors /  Indicators: Grimacing Pain Intervention(s): Limited activity within patient's tolerance;Monitored during session;Repositioned    Home Living                      Prior Function            PT Goals (current goals can now be found in the care plan section) Acute Rehab PT Goals Patient Stated Goal: per family for pt to get better Progress towards PT goals: Not progressing toward goals - comment (nosebleeding)    Frequency    Min 2X/week      PT Plan Current plan remains appropriate    Co-evaluation              AM-PAC PT "6 Clicks" Mobility   Outcome Measure  Help needed turning from your back to your side while in a flat bed without using bedrails?: Total Help needed moving from lying on your back to sitting on the side of a flat bed without using bedrails?: Total Help needed moving to and from a bed to a chair (including a wheelchair)?: Total Help needed standing up from a chair using your arms (e.g., wheelchair or bedside chair)?: Total Help needed to walk in hospital room?: Total Help needed climbing 3-5 steps with a railing? : Total 6 Click Score: 6    End of Session   Activity Tolerance: Patient limited by pain (limited by nosebleed) Patient left: in bed;with call bell/phone within reach;with bed alarm set Nurse Communication: Mobility status;Need for lift equipment PT Visit Diagnosis: History of falling (Z91.81);Difficulty in walking, not elsewhere classified (R26.2);Muscle weakness (generalized) (M62.81);Other abnormalities of gait and mobility (R26.89)     Time: 6712-4580 PT Time Calculation (min) (ACUTE ONLY): 16 min  Charges:  $Therapeutic Exercise: 8-22 mins                     Kristy [name],PT Acute Rehab Services [phone] (pager)    Kristy [name] 06/27/2021, 1:24 PM

## 2021-06-28 LAB — BASIC METABOLIC PANEL
Anion gap: 7 (ref 5–15)
BUN: 48 mg/dL — ABNORMAL HIGH (ref 8–23)
CO2: 25 mmol/L (ref 22–32)
Calcium: 8 mg/dL — ABNORMAL LOW (ref 8.9–10.3)
Chloride: 117 mmol/L — ABNORMAL HIGH (ref 98–111)
Creatinine, Ser: 1.58 mg/dL — ABNORMAL HIGH (ref 0.44–1.00)
GFR, Estimated: 34 mL/min — ABNORMAL LOW (ref >60)
Glucose, Bld: 171 mg/dL — ABNORMAL HIGH (ref 70–99)
Potassium: 3.4 mmol/L — ABNORMAL LOW (ref 3.5–5.1)
Sodium: 149 mmol/L — ABNORMAL HIGH (ref 135–145)

## 2021-06-28 LAB — GLUCOSE, CAPILLARY
Glucose-Capillary: 160 mg/dL — ABNORMAL HIGH (ref 70–99)
Glucose-Capillary: 169 mg/dL — ABNORMAL HIGH (ref 70–99)
Glucose-Capillary: 123 mg/dL — ABNORMAL HIGH (ref 70–99)
Glucose-Capillary: 136 mg/dL — ABNORMAL HIGH (ref 70–99)
Glucose-Capillary: 136 mg/dL — ABNORMAL HIGH (ref 70–99)

## 2021-06-28 LAB — PREPARE RBC (CROSSMATCH)

## 2021-06-28 LAB — ALBUMIN: Albumin: 1.2 g/dL — ABNORMAL LOW (ref 3.5–5.0)

## 2021-06-28 LAB — CBC
HCT: 21.4 % — ABNORMAL LOW (ref 36.0–46.0)
Hemoglobin: 6.7 g/dL — CL (ref 12.0–15.0)
MCH: 32.5 pg (ref 26.0–34.0)
MCHC: 31.3 g/dL (ref 30.0–36.0)
MCV: 103.9 fL — ABNORMAL HIGH (ref 80.0–100.0)
Platelets: 109 10*3/uL — ABNORMAL LOW (ref 150–400)
RBC: 2.06 MIL/uL — ABNORMAL LOW (ref 3.87–5.11)
RDW: 15.5 % (ref 11.5–15.5)
WBC: 7.4 10*3/uL (ref 4.0–10.5)
nRBC: 0 % (ref 0.0–0.2)

## 2021-06-28 LAB — PROTIME-INR
INR: 1.4 — ABNORMAL HIGH (ref 0.8–1.2)
Prothrombin Time: 17.2 seconds — ABNORMAL HIGH (ref 11.4–15.2)

## 2021-06-28 LAB — HEMOGLOBIN AND HEMATOCRIT, BLOOD
HCT: 25 % — ABNORMAL LOW (ref 36.0–46.0)
Hemoglobin: 8.2 g/dL — ABNORMAL LOW (ref 12.0–15.0)

## 2021-06-28 LAB — ABO/RH: ABO/RH(D): B NEG

## 2021-06-28 MED ORDER — SODIUM CHLORIDE 0.9% IV SOLUTION: Freq: Once | INTRAVENOUS | Status: AC

## 2021-06-28 MED ORDER — OXYCODONE HCL 5 MG PO TABS
2.5000 mg | ORAL_TABLET | Freq: Three times a day (TID) | ORAL | Status: DC
  Administered 2021-06-28 – 2021-07-01 (×9): 2.5 mg via ORAL
  Filled 2021-06-28 (×9): qty 1

## 2021-06-28 MED ORDER — CEFAZOLIN SODIUM-DEXTROSE 2-4 GM/100ML-% IV SOLN
2.0000 g | INTRAVENOUS | Status: AC
  Filled 2021-06-28: qty 100

## 2021-06-28 NOTE — Progress Notes (Addendum)
[facility] PROGRESS NOTE  Kristy [name] BLT:[id number] DOB: Jan 07, 1946 DOA: 06/10/2021 PCP: Lujean Amel, MD  Status: Remains inpatient appropriate because:Persistent severe electrolyte disturbances, Altered mental status, Unsafe d/c plan, IV treatments appropriate due to intensity of illness or inability to take PO, and Inpatient level of care appropriate due to severity of illness  Dispo: The patient is from: Home              Anticipated d/c is to: SNF              Patient currently is not medically stable to d/c.   Difficult to place patient No    Level of care: Progressive  Code Status: Full Family Communication:  DVT prophylaxis: Changed to SQ heparin given estimated GFR 29 COVID vaccination status: [place] 02/06/2020 and 02/28/2020; will need booster once has improved from a neurological standpoint  Foley catheter: Yes POA: No Date inserted: 06/16/21 Still needed?/Rationale: Yes-accurate I's/O while hydrating for AKI and hypernatremia  HPI: 75 y.o. female with a history of alcohol use who presented to the ED for frequent falls, generalized weakness, and confusion. She was found to be afebrile, hypertensive, and without hypoxia. Also had hypokalemia (K 2.6), SCr 1.88 (above baseline ~1.5), LFT elevation (AST 576, ALT 305). Ammonia normal. EtOH, salicylate, tylenol levels negative. CT head and cervical spine were reassuring, though the patient was unable to rise to seated position from laying prompting consultation for admission. The patient was given potassium supplementation, started on CIWA protocol, and admitted with note made of continued AMS. She has remained weak and confused, though on 8/5 became less interactive, retaining urine, WBC noted to be rising. Blood and urine cultures ordered, ceftriaxone given with bacteriuria and urinary retention. MR brain has not been done yet. Neurology consulted with worsening mental status and nuchal rigidity. LP under fluoro requested,  empirically treating meningitis/encephalitis.   Subjective: Patient awake and alert and reporting significant decrease in neck pain.  It is noted that upon my entry into the room for the first time patient is laying in bed with head to midline and not positioned facing towards the right.  Objective: Vitals:   06/27/21 2257 06/28/21 0349  BP: (!) 126/57 (!) 117/47  Pulse: 98 94  Resp: 20 20  Temp: 98.5 F (36.9 C) 98.1 F (36.7 C)  SpO2: 100% 99%    Intake/Output Summary (Last 24 hours) at 06/28/2021 0735 Last data filed at 06/28/2021 0350 Gross per 24 hour  Intake --  Output 2050 ml  Net -2050 ml   Filed Weights   06/24/21 0400 06/25/21 0336 06/28/21 0500  Weight: 78.6 kg 78.8 kg 85.7 kg    Exam: Constitutional: NAD, calm, minimal distress as related to neck pain that has markedly decreased ENT: Evidence of old blood in both nares no active bleeding Respiratory: Lungs are clear to auscultation.  Patient with mild tachypnea.  Room air saturations 99-100%. Cardiovascular: Regular rate with mild tachycardia sinus etiology- normotensive.  Focal edema of upper extremities. 2+ pedal pulses.  Abdomen: no tenderness, no masses palpated. Cortrak in place for enteral feedings.  Bowel sounds positive. LBM 8/16 Genitourinary: Foley catheter in place with amber-colored urine noted in bedside bag although less dark than previous Musculoskeletal: Still has preferential positioning of head and neck to the right.  Still has contracture but much less pain with PROM applied by caregivers Skin: Right anterior neck incision approximated with expected inflammatory induration directly beneath scar without any associated erythema Neurologic: CN 2-12 grossly  intact. Sensation intact, Strength 2/5 x all 4 extremities.  Psychiatric: Awake and oriented x 3. Normal mood.  Still remains somewhat drowsy   Assessment/Plan: Acute problems: Severe sepsis secondary to MSSA Cervical epidural abscess  (C2-T3/4);  Cervical discitis/osteomyelitis with associated torticollis and cervical contract; meningitis ruled out UTI (POA);  - Neuro, ID, and Neurosurgery consulted -TTE: No vegetation/endocarditis ID initially concerned for possible systemic or central etiology given multiple sites of staph infection -S/P I/D on  06/16/21 - copious amounts Drainage involving longus colli with myositis, discitis, osteomyelitis, large epidural abscess s/p drainage cultures positive for MSSA - Nafcilln pharmacy dosed 8/07 - 06/30/21 FOLLOWED BY Cefazolin pharmacy dosed 8/22 - 08/11/2021 - Rifampin 600 mg BID to be continued throughout the treatment as she can tolerate for 8 weeks; End Dates: 8/21 for nafcillin; 10/02 for cefazolin   -Given underlying CKD 3B uncertain if patient would be an appropriate candidate for PICC line -8/17 added low-dose Skelaxin TID decreased pain and neck spasm as of 8/19 patient is now keeping head to the midline -Therapy sts pt in significant pain w/ any movement and this is affecting therapy sessions and sleep. Will cahnge from prn narcs to Oxy IR scheduled q 8 hrs  Acute metabolic encephalopathy 2/2 sepsis physiology and persistent hypernatremia/associated profound dysphagia - Neurology consulted - bedside LP unable to be performed  - MRI remarkable for osteo/epidural abscess as above -SLP following and given altered mentation and overall weakness unable to allow oral intake at this time noting patient unable to swallow adequately -cortrack for feedings and free water/meds and as of 8/18 have consulted IR for PEG tube placement  Epistaxis -Had epistaxis overnight but unable to achieve hemostasis with conservative measures (Gore-Tex packing) -ENT/Dr. Lucia Gaskins evaluated patient and cauterized bleeding vessel in right anterior nostril with silver nitrate, aspirin and heparin stopped. -No further bleeding as of this morning on 8/19.  Patient did have some mild bleeding when she was sat  upright in the bed and she leaned forward somewhat on 8/18 but no further recurrence  Macrocytosis /anemia of critical illness as well as associated chronic kidney disease -8/17 hemoglobin 7.9 (?  Hemodilution)-on 8/18 even after recent epistaxis hemoglobin 8.0 -Hgb down to 6.7 after recent epistaxis-spoke w/ dtr for consent and 1 unit PRBCs ordered (hold IVFs while blood infusing)-ck CBC after transfusion -Anemia symptomatic (mild tachycardia and mild tachypnea) -Consider initiating Aranesp or other form of erythropoietin  Thrombocytopenia  -platelets 140,000 on 8/9 and decreased to 110,000 by  8/11 -8/17 platelets 105,000 and on 8/19 platelets up to 109,000 -Aspirin and heparin discontinued in context of epistaxis and likely until platelets fully recover to greater than 150,000 it is unlikely we will be able to resume anticoagulants for DVT prophylax   Hypernatremic/hyperchloremic, improving -Likely secondary to previous IV fluid boluses and concurrent IV medications  -Appears very dry noting continues to have elevated BUN and abnormal creatinine, low EGFR and sodium remains elevated at 159 (ECHO 8/9 also consistent with volume depletion noting CVP 3)-8/19 sodium has decreased to 149 -Continue D5 half-normal saline at 100 cc/h but hold during infusion of blood  -Patient also has third spacing of fluid and her albumin is less than 1.0 so we will give a trial of albumin infusion on 8/17  AKI on stage IIIb CKD, stabilizing:  -Baseline renal function: BUN 24 and creatinine 1.5 with he GFR slightly greater than 30 -8/17: BUN 77 and creatinine 1.73 therefore initiated IV fluids and increased free water. As of  8/19 BUN 48 and creatinine 1.58 - Monitor renal function given requirement for vancomycin,   UTI, POA, resolved:  - Covered by above abx - treatment completed   Ambulatory dysfunction with fall at home, severe weakness:  - Secondary to above - PT/OT currently recommending SNF  rehabilitation once acute infectious process under better control -    Alcoholic hepatitis:  - Based on AST predominance of LFT elevations. Hepatitis panel nonreactive.  Family confirms that patient drank 2 beers per day prior to admission - No indication for prednisolone at this time. Continue trending. - AST/ALT downtrending - bilirubin increased marginally    Alcohol abuse:  - Per family - 2 beers per day of late, etoh level negative in ED.  - Cessation counseling to be repeated once pt more clear-headed. - Empirically supplementing thiamine, folate, MVM.   Hypokalemia: -8/16 potassium was low at 3.1 and after repletion has increased to 3.5 -Have added low-dose potassium to maintenance fluids -Begin potassium per tube daily   Severe protein caloric malnutrition  -Continue tube feeds and free water flushes via cortrack - Albumin continues to downtrend despite increased tube feeds we will give 4 doses of albumin as above Nutrition Problem: Moderate Malnutrition Etiology: chronic illness Signs/Symptoms: moderate muscle depletion, moderate fat depletion, mild muscle depletion, mild fat depletion Interventions: Prostat, Tube feeding  HTN/grade 1 diastolic dysfunction: -Hold ARB given AKI -Continue Norvasc -Echo this admission with an EF of 65 to 70% with right 1 diastolic physiology, also noted with mild MR and AR   HLD: - Hold statin for now; AST has decreased to 51 and ALT normal at 38 total bili slightly elevated at 1.3      Data Reviewed: Basic Metabolic Panel: Recent Labs  Lab 06/23/21 1622 06/25/21 0329 06/26/21 0753 06/27/21 0214 06/28/21 0639  NA 154* 156* 159* 152* 149*  K 3.0* 3.1* 3.5 3.3* 3.4*  CL 122* 121* 125* 120* 117*  CO2 '24 26 26 25 25  ' GLUCOSE 146* 147* 150* 115* 171*  BUN 83* 82* 77* 67* 48*  CREATININE 1.94* 1.81* 1.73* 1.47* 1.58*  CALCIUM 8.0* 8.4* 8.3* 8.0* 8.0*   Liver Function Tests: Recent Labs  Lab 06/23/21 0511 06/28/21 0639  AST  51*  --   ALT 38  --   ALKPHOS 105  --   BILITOT 4.7*  --   PROT 4.6*  --   ALBUMIN <1.0* 1.2*   No results for input(s): LIPASE, AMYLASE in the last 168 hours. No results for input(s): AMMONIA in the last 168 hours. CBC: Recent Labs  Lab 06/21/21 1010 06/22/21 0550 06/24/21 1042 06/26/21 0753 06/27/21 0543 06/27/21 1336 06/27/21 1932 06/28/21 0639  WBC 13.3* 10.7* 9.9 9.7 7.6  --   --  7.4  NEUTROABS 11.8*  --   --   --   --   --   --   --   HGB 10.8* 9.4* 9.8* 7.9* 8.0* 6.9* 7.1* 6.7*  HCT 34.6* 29.5* 30.7* 25.5* 26.1* 21.6* 22.4* 21.4*  MCV 103.0* 102.1* 102.7* 104.1* 104.0*  --   --  103.9*  PLT 97* 92* 105* 113* 106*  --   --  109*   Cardiac Enzymes: No results for input(s): CKTOTAL, CKMB, CKMBINDEX, TROPONINI in the last 168 hours. BNP (last 3 results) No results for input(s): BNP in the last 8760 hours.  ProBNP (last 3 results) No results for input(s): PROBNP in the last 8760 hours.  CBG: Recent Labs  Lab 06/26/21 2302 06/27/21 0255  06/27/21 2203 06/27/21 2256 06/28/21 0347  GLUCAP 138* 96 137* 121* 160*    No results found for this or any previous visit (from the past 240 hour(s)).    Studies: CT ABDOMEN WO CONTRAST  Result Date: 06/27/2021 CLINICAL DATA:  assess anatomy for gastrostomy tube EXAM: CT ABDOMEN WITHOUT CONTRAST TECHNIQUE: Multidetector CT imaging of the abdomen was performed following the standard protocol without IV contrast. COMPARISON:  None. FINDINGS: Lower chest: Small bilateral pleural effusions with bibasilar atelectasis. Hepatobiliary: No focal liver abnormality is seen. No gallstones, gallbladder wall thickening, or biliary dilatation. Pancreas: Unremarkable. No pancreatic ductal dilatation or surrounding inflammatory changes. Spleen: Normal in size without focal abnormality. Adrenals/Urinary Tract: Adrenal glands are unremarkable. No hydronephrosis. There is a 3.0 cm low-density lesion in the left kidney which is compatible with a  cyst. Stomach/Bowel: The stomach is within normal limits. There is a feeding tube in place with tip in the distal stomach. There is no other bowel interposed between the stomach and the abdominal wall, though the transverse colon is in close proximity. There is no evidence of bowel obstruction. The appendix is normal. Scattered colonic diverticuli. No bowel wall thickening or adjacent inflammatory changes. Vascular/Lymphatic: Aortoiliac atherosclerotic calcifications. No AAA. No lymphadenopathy. Other: No abdominal wall hernia or abnormality. No abdominal ascites. Musculoskeletal: No acute osseous abnormality. No suspicious lytic or blastic lesions. Multilevel degenerative changes of the spine, with severe lower lumbar predominant facet arthropathy. Likely chronic right SI joint subchondral erosive change, with sclerotic margins. IMPRESSION: No acute abnormality in the abdomen. Feeding tube is in place with tip in the distal stomach. No other bowel is interposed between the stomach and the anterior abdominal wall, though the transverse colon is in close proximity. Small bilateral pleural effusions with adjacent bibasilar atelectasis. Electronically Signed   By: Maurine Simmering M.D.   On: 06/27/2021 16:18    Scheduled Meds:  sodium chloride   Intravenous Once   allopurinol  300 mg Per Tube Daily   amLODipine  10 mg Per Tube Daily   Chlorhexidine Gluconate Cloth  6 each Topical Daily   diclofenac Sodium  4 g Topical QID   folic acid  1 mg Per Tube Daily   free water  200 mL Per Tube Q4H   metaxalone  400 mg Per Tube TID   multivitamin with minerals  1 tablet Per Tube Daily   oxymetazoline  2 spray Each Nare BID   rifampin  300 mg Oral Q12H   thiamine injection  100 mg Intravenous Daily   Continuous Infusions:  dextrose 5 % and 0.45 % NaCl with KCl 20 mEq/L 100 mL/hr at 06/27/21 1036   dextrose     feeding supplement (OSMOLITE 1.2 CAL) 1,000 mL (06/27/21 1039)   Nafcillin 12g infusion in dextrose 5% 500  mL 20.8 mL/hr at 06/26/21 1705    Principal Problem:   Abscess in epidural space of cervical spine Active Problems:   Fall   Alcoholic hepatitis   Altered mental status   Acute delirium   Sepsis ([place])   Malnutrition of moderate degree   Consultants: Neurology Psychiatry Infectious disease Neurosurgery  Procedures: Echocardiogram Cervical discectomy and fusion on 06/16/2021  Antibiotics: Acyclovir on 8/5 and 8/6 Ampicillin 8/5 through 8/8 Ceftriaxone 8/5 through 8/9 Vancomycin 8/6 through 8/8 Nafcillin 8/9 >> Rifampin 8/12 >>   Time spent: 45 minutes    Erin Hearing ANP  [facility] 7 am - 330 pm/M-F for direct patient care and secure chat Please refer  to Amion for contact info 17  days

## 2021-06-28 NOTE — TOC Progression Note (Signed)
Transition of Care [facility]) - Progression Note    Patient Details  Name: Kristy [name] MRN: [id number] Date of Birth: [birth year]  Transition of Care [facility]) CM/SW Contact  [name], RN Phone Number: 06/28/2021, 9:49 AM  Clinical Narrative:    Case management spoke with the patient's daughter, Kristy [name], this morning and discussed transitions of care needs to a skilled nursing facility of choice.  The patient's daughter is researching the SNF facilities today and will make a decision as to which facility that she has accepted for admission by Monday, 07/01/2021.  The daughter was given a list of the accepting facilities to research on the Medicare.gov website.  The patient has pending placement of PEG tube and PRBC infusion orders at this time and is not medically stable for transfer.  CM and MSW will continue to follow the patient for [facility] placement.   Expected Discharge Plan: Skilled Nursing Facility Barriers to Discharge: Continued Medical Work up  Expected Discharge Plan and Services Expected Discharge Plan: Skilled Nursing Facility In-house Referral: Clinical Social Work Discharge Planning Services: CM Consult Post Acute Care Choice: Skilled Nursing Facility Living arrangements for the past 2 months: Single Family Home                                       Social Determinants of Health (SDOH) Interventions    Readmission Risk Interventions No flowsheet data found.

## 2021-06-28 NOTE — Consult Note (Signed)
Chief Complaint: Patient was seen in consultation today for gastrostomy tube placement   Referring Physician(s): Junious Silk, NP  Supervising Physician: Simonne Come  Patient Status: [facility] - In-pt  History of Present Illness: Kristy [name] is a 75 y.o. female w/ hx of ETOH abuse, falls, gen weakness, UTI and AMS. She presented to the ED 06/10/21 c/o fall 3 days PTA. 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 Pt had ACDF for large ventral cervical epidural abscess 06/16/21. She was referred for consult for gastrostomy tube placement d/t inadequate PO intake and severe electrolyte imbalance/malnutrition.    Pt lying in bed. She is pleasant and alert and answers questions appropriately. She has no c/o at this time. She is receiving blood transfusion and tube feeds via Cortrak at time of exam. She is in NAD.  Past Surgical History:  Procedure Laterality Date   ANTERIOR CERVICAL DECOMP/DISCECTOMY FUSION N/A 06/16/2021   Procedure: ANTERIOR CERVICAL DISCECTOMY FUSION Cervical five -Cervical six, Evacuation of Epidural Abscess;  Surgeon: Jadene Pierini, MD;  Location: [facility] OR;  Service: Neurosurgery;  Laterality: N/A;    Allergies: Patient has no known allergies.  Medications: Prior to Admission medications   Medication Sig Start Date End Date Taking? Authorizing Provider  allopurinol (ZYLOPRIM) 300 MG tablet Take 300 mg by mouth daily. 12/13/15   [provider]  aspirin 81 MG chewable tablet Chew 81 mg by mouth daily.    [provider]  rosuvastatin (CRESTOR) 20 MG tablet Take 20 mg by mouth daily.    [provider]  valsartan-hydrochlorothiazide (DIOVAN-HCT) 160-25 MG tablet Take 1 tablet by mouth daily. 12/13/15   [provider]     History reviewed. No pertinent family history.  Social History   Socioeconomic History   Marital status: Married    Spouse name: Not on  file   Number of children: Not on file   Years of education: Not on file   Highest education level: Not on file  Occupational History   Not on file  Tobacco Use   Smoking status: Not on file   Smokeless tobacco: Not on file  Substance and Sexual Activity   Alcohol use: Not on file   Drug use: Not on file   Sexual activity: Not on file  Other Topics Concern   Not on file  Social History Narrative   Not on file   Social Determinants of Health   Financial Resource Strain: Not on file  Food Insecurity: Not on file  Transportation Needs: Not on file  Physical Activity: Not on file  Stress: Not on file  Social Connections: Not on file    Review of Systems  Constitutional:  Negative for chills and fever.  Respiratory:  Negative for shortness of breath.   Cardiovascular:  Negative for chest pain.   Vital Signs: BP (!) 108/58 (BP Location: Right Arm)   Pulse 98   Temp 98.4 F (36.9 C) (Oral)   Resp (!) 21   Ht 5\' 6"  (1.676 m)   Wt 188 lb 15 oz (85.7 kg)   SpO2 100%   BMI 30.49 kg/m   Physical Exam Constitutional:      Appearance: She is ill-appearing.  HENT:     Nose:     Comments: Cortrak L nare    Mouth/Throat:     Mouth: Mucous membranes are dry.  Cardiovascular:     Rate and Rhythm: Normal  rate and regular rhythm.     Heart sounds: Normal heart sounds.     Comments: NSR w/ occasional PVCs Pulmonary:     Effort: Pulmonary effort is normal.     Breath sounds: Normal breath sounds. No stridor. No wheezing, rhonchi or rales.  Abdominal:     General: Bowel sounds are normal.     Palpations: Abdomen is soft.     Tenderness: There is no abdominal tenderness. There is no guarding.  Musculoskeletal:     Comments: SCDs in place   Skin:    General: Skin is warm and dry.     Comments: Anterior neck surgical site C/D/I w/o drainage, bleeding or other s/sx of infection  Neurological:     Mental Status: She is alert.  Psychiatric:        Mood and Affect: Mood  normal.        Behavior: Behavior normal.    Imaging: CT ABDOMEN WO CONTRAST  Result Date: 06/27/2021 CLINICAL DATA:  assess anatomy for gastrostomy tube EXAM: CT ABDOMEN WITHOUT CONTRAST TECHNIQUE: Multidetector CT imaging of the abdomen was performed following the standard protocol without IV contrast. COMPARISON:  None. FINDINGS: Lower chest: Small bilateral pleural effusions with bibasilar atelectasis. Hepatobiliary: No focal liver abnormality is seen. No gallstones, gallbladder wall thickening, or biliary dilatation. Pancreas: Unremarkable. No pancreatic ductal dilatation or surrounding inflammatory changes. Spleen: Normal in size without focal abnormality. Adrenals/Urinary Tract: Adrenal glands are unremarkable. No hydronephrosis. There is a 3.0 cm low-density lesion in the left kidney which is compatible with a cyst. Stomach/Bowel: The stomach is within normal limits. There is a feeding tube in place with tip in the distal stomach. There is no other bowel interposed between the stomach and the abdominal wall, though the transverse colon is in close proximity. There is no evidence of bowel obstruction. The appendix is normal. Scattered colonic diverticuli. No bowel wall thickening or adjacent inflammatory changes. Vascular/Lymphatic: Aortoiliac atherosclerotic calcifications. No AAA. No lymphadenopathy. Other: No abdominal wall hernia or abnormality. No abdominal ascites. Musculoskeletal: No acute osseous abnormality. No suspicious lytic or blastic lesions. Multilevel degenerative changes of the spine, with severe lower lumbar predominant facet arthropathy. Likely chronic right SI joint subchondral erosive change, with sclerotic margins. IMPRESSION: No acute abnormality in the abdomen. Feeding tube is in place with tip in the distal stomach. No other bowel is interposed between the stomach and the anterior abdominal wall, though the transverse colon is in close proximity. Small bilateral pleural  effusions with adjacent bibasilar atelectasis. Electronically Signed   By: Caprice Renshaw M.D.   On: 06/27/2021 16:18   DG Chest 1 View  Result Date: 06/11/2021 CLINICAL DATA:  frequent falls, weakness EXAM: CHEST  1 VIEW COMPARISON:  None. FINDINGS: Relative opacification of the left hemithorax is likely artifactual and related to rotation and overlying soft tissue on this examination. The lungs are clear. No pneumothorax or pleural effusion. Cardiac size is within normal limits. No acute bone abnormality. IMPRESSION: No active disease. Electronically Signed   By: Helyn Numbers MD   On: 06/11/2021 00:18   DG Cervical Spine 1 View  Result Date: 06/16/2021 CLINICAL DATA:  Neck pain EXAM: DG CERVICAL SPINE - 1 VIEW; DG C-ARM 1-60 MIN COMPARISON:  MRI 06/16/2021 FINDINGS: Single view intraoperative radiograph of the cervical spine demonstrates cervical spine from the occiput foot through C5-6. C5-6 anterior cervical discectomy and fusion with instrumentation has been performed. Normal alignment. Ray-Tec is seen within the soft tissues anterior to the  fusion. Endotracheal tube is in place. Fluoroscopy time: 3 seconds Dose: 0.25 mGy Images: 1 IMPRESSION: ACDF C5-6.  Ray-Tec in place within the anterior soft tissues. Electronically Signed   By: Helyn Numbers MD   On: 06/16/2021 15:04   CT HEAD WO CONTRAST ( )  Result Date: 06/14/2021 CLINICAL DATA:  Mental status changes. EXAM: CT HEAD WITHOUT CONTRAST TECHNIQUE: Contiguous axial images were obtained from the base of the skull through the vertex without intravenous contrast. COMPARISON:  Head CT 06/2021 FINDINGS: Brain: Stable age related cerebral atrophy, ventriculomegaly and periventricular white matter disease. No extra-axial fluid collections are identified. No CT findings for acute hemispheric infarction or intracranial hemorrhage. No mass lesions. The brainstem and cerebellum are normal. Vascular: Stable vascular calcifications.  No hyperdense vessels.  Skull: No skull fracture or bone lesions. Sinuses/Orbits: The paranasal sinuses and mastoid air cells are clear. The globes are intact. Other: No scalp lesions or scalp hematoma. IMPRESSION: 1. Stable age related cerebral atrophy, ventriculomegaly and periventricular white matter disease. 2. No acute intracranial findings or mass lesions. Electronically Signed   By: Rudie Meyer M.D.   On: 06/14/2021 17:22   CT Head Wo Contrast  Result Date: 06/11/2021 CLINICAL DATA:  Neck trauma EXAM: CT HEAD WITHOUT CONTRAST CT CERVICAL SPINE WITHOUT CONTRAST TECHNIQUE: Multidetector CT imaging of the head and cervical spine was performed following the standard protocol without intravenous contrast. Multiplanar CT image reconstructions of the cervical spine were also generated. COMPARISON:  CT head 05/14/2008 FINDINGS: CT HEAD FINDINGS Brain: Cerebral ventricle sizes are concordant with the degree of cerebral volume loss. Patchy and confluent areas of decreased attenuation are noted throughout the deep and periventricular white matter of the cerebral hemispheres bilaterally, compatible with chronic microvascular ischemic disease. No evidence of large-territorial acute infarction. No parenchymal hemorrhage. No mass lesion. No extra-axial collection. No mass effect or midline shift. No hydrocephalus. Basilar cisterns are patent. Vascular: No hyperdense vessel. Skull: No acute fracture or focal lesion. Sinuses/Orbits: Paranasal sinuses and mastoid air cells are clear. The orbits are unremarkable. Other: None. CT CERVICAL SPINE FINDINGS Alignment: Normal. 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 Multilevel degenerative changes of the spine. No acute fracture. No aggressive appearing focal osseous lesion or focal pathologic process. Soft tissues and spinal canal: No prevertebral fluid or swelling. No visible canal hematoma. Upper chest: Unremarkable. Other: None. IMPRESSION: 1. No acute  intracranial abnormality. 2. No acute displaced fracture or traumatic listhesis of the cervical spine. Electronically Signed   By: Tish Frederickson M.D.   On: 06/11/2021 00:58   CT Cervical Spine Wo Contrast  Result Date: 06/11/2021 CLINICAL DATA:  Neck trauma EXAM: CT HEAD WITHOUT CONTRAST CT CERVICAL SPINE WITHOUT CONTRAST TECHNIQUE: Multidetector CT imaging of the head and cervical spine was performed following the standard protocol without intravenous contrast. Multiplanar CT image reconstructions of the cervical spine were also generated. COMPARISON:  CT head 05/14/2008 FINDINGS: CT HEAD FINDINGS Brain: Cerebral ventricle sizes are concordant with the degree of cerebral volume loss. Patchy and confluent areas of decreased attenuation are noted throughout the deep and periventricular white matter of the cerebral hemispheres bilaterally, compatible with chronic microvascular ischemic disease. No evidence of large-territorial acute infarction. No parenchymal hemorrhage. No mass lesion. No extra-axial collection. No mass effect or midline shift. No hydrocephalus. Basilar cisterns are patent. Vascular: No hyperdense vessel. Skull: No acute fracture or focal lesion. Sinuses/Orbits: Paranasal sinuses and mastoid air cells are clear. The orbits are unremarkable.  Other: None. CT CERVICAL SPINE FINDINGS Alignment: Normal. 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 Multilevel degenerative changes of the spine. No acute fracture. No aggressive appearing focal osseous lesion or focal pathologic process. Soft tissues and spinal canal: No prevertebral fluid or swelling. No visible canal hematoma. Upper chest: Unremarkable. Other: None. IMPRESSION: 1. No acute intracranial abnormality. 2. No acute displaced fracture or traumatic listhesis of the cervical spine. Electronically Signed   By: Tish Frederickson M.D.   On: 06/11/2021 00:58   MR BRAIN WO CONTRAST  Result Date:  06/15/2021 CLINICAL DATA:  Mental status change, unknown cause. Concern for meningitis. EXAM: MRI HEAD WITHOUT CONTRAST TECHNIQUE: Multiplanar, multiecho pulse sequences of the brain and surrounding structures were obtained without intravenous contrast. COMPARISON:  Head CT 06/14/2021 FINDINGS: Brain: There is no evidence of an acute infarct, intracranial hemorrhage, mass, midline shift, or extra-axial fluid collection. There is mild-to-moderate cerebral atrophy. There is confluent T2 hyperintensity along the margins of the occipital and temporal horns of the lateral ventricles suspicious for mild edema. Patchy T2 hyperintensity in the periatrial regions more resembles chronic small vessel ischemia. No definite debris is evident in the ventricles. No sulcal FLAIR signal abnormality is identified. Vascular: Major intracranial vascular flow voids are preserved. Skull and upper cervical spine: No suspicious calvarial lesion. Abnormal intermediate T1 signal material in the midline and left lateral ventral spinal canal at C2-3, possibly epidural. Sinuses/Orbits: Paranasal sinuses and mastoid air cells are clear. Unremarkable orbits. Other: None. IMPRESSION: 1. Confluent periventricular T2 hyperintensity, indeterminate. Given the clinical concern for infection, this could reflect mild edema from ventriculitis although no ventricular debris is evident and there are no other findings of meningitis on this unenhanced study. 2. Mild chronic small vessel ischemic disease elsewhere in the cerebral white matter. 3. No acute infarct. 4. Abnormal material in the included upper cervical spinal canal of uncertain etiology. Cervical spine MRI is recommended for further evaluation. These results will be called to the ordering clinician or representative by the Radiologist Assistant, and communication documented in the PACS or [employer]. Electronically Signed   By: Sebastian Ache M.D.   On: 06/15/2021 19:16   MR CERVICAL SPINE WO  CONTRAST  Result Date: 06/16/2021 CLINICAL DATA:  Cervical radiculopathy. Infection suspected. Elevated CRP and leukocytosis. EXAM: MRI CERVICAL, THORACIC AND LUMBAR SPINE WITHOUT CONTRAST TECHNIQUE: Multiplanar and multiecho pulse sequences of the cervical spine, to include the craniocervical junction and cervicothoracic junction, and thoracic and lumbar spine, were obtained without intravenous contrast. COMPARISON:  Cervical spine CT from admission FINDINGS: MRI CERVICAL SPINE FINDINGS Alignment: Exaggerated cervical lordosis with mild C7-T1 anterolisthesis. Vertebrae: Subtle marrow edema at the C5-C7 vertebral levels. There is disc T2 hyperintensity at C5-6 and C6-7 with extensive prevertebral edema and posterior-lateral paravertebral edema. Cord: Multilevel deformity from epidural collection extending from approximately C2-T4, more inferiorly extensive posteriorly. No detected cord edema Posterior Fossa, vertebral arteries, paraspinal tissues: As above Disc levels: Given patient motion, evaluation of degenerative disease is limited. There is diffuse degenerative facet spurring. Milder disc degeneration with generalized narrowing and bulging. MRI THORACIC SPINE FINDINGS Alignment:  Exaggerated thoracic kyphosis. Vertebrae: T1-2 disc and adjacent endplate hyperintensity. Cord: Normal signal and morphology. Epidural collection continues from the cervical levels into the upper thoracic spine, posteriorly terminating at approximately T3-4. Paraspinal and other soft tissues: Paravertebral edema at the upper thoracic levels related to cervical spine disease. Disc levels: Generalize bridging osteophytes and ankylosis. Upper thoracic degenerative facet spurring.  No canal or foraminal impingement MRI LUMBAR SPINE FINDINGS Segmentation:  5 lumbar type vertebrae Alignment:  Mild degenerative anterolisthesis at L4-5 Vertebrae: No visible fracture, discitis, or aggressive bone lesion. Conus medullaris and cauda equina: Conus  extends to the T12-L1 level. Conus and cauda equina appear normal. Paraspinal and other soft tissues: Cystic appearing spaces in the presacral space on the right intimately associated with piriformis. The sacroiliac joints are largely on covered. No visible adjacent marrow edema Disc levels: T12- L1: Disc narrowing and bulging.  Mild facet spurring L1-L2: Disc narrowing and bulging.  Mild facet spurring L2-L3: Disc narrowing and bulging with degenerative facet spurring. Left subarticular recess stenosis that could affect the L3 nerve root L3-L4: Disc narrowing and bulging with degenerative facet spurring L4-L5: Facet osteoarthritis with spurring and anterolisthesis. The disc is narrowed and bulging. Moderate spinal stenosis. L5-S1:Facet osteoarthritis with bulky spurring. Mild disc space narrowing. Case discussed with Dr. Amada Jupiter. IMPRESSION: 1. 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 2. Suspected inflammation/collection partially covered in the right pelvis along the piriformis, possible septic arthritis of the right sacroiliac joint which is only minimally covered. Recommend dedicated pelvis MRI. Electronically Signed   By: Marnee Spring M.D.   On: 06/16/2021 04:44   MR THORACIC SPINE WO CONTRAST  Result Date: 06/16/2021 CLINICAL DATA:  Cervical radiculopathy. Infection suspected. Elevated CRP and leukocytosis. EXAM: MRI CERVICAL, THORACIC AND LUMBAR SPINE WITHOUT CONTRAST TECHNIQUE: Multiplanar and multiecho pulse sequences of the cervical spine, to include the craniocervical junction and cervicothoracic junction, and thoracic and lumbar spine, were obtained without intravenous contrast. COMPARISON:  Cervical spine CT from admission FINDINGS: MRI CERVICAL SPINE FINDINGS Alignment: Exaggerated cervical lordosis with mild C7-T1 anterolisthesis. Vertebrae: Subtle marrow edema at the C5-C7 vertebral  levels. There is disc T2 hyperintensity at C5-6 and C6-7 with extensive prevertebral edema and posterior-lateral paravertebral edema. Cord: Multilevel deformity from epidural collection extending from approximately C2-T4, more inferiorly extensive posteriorly. No detected cord edema Posterior Fossa, vertebral arteries, paraspinal tissues: As above Disc levels: Given patient motion, evaluation of degenerative disease is limited. There is diffuse degenerative facet spurring. Milder disc degeneration with generalized narrowing and bulging. MRI THORACIC SPINE FINDINGS Alignment:  Exaggerated thoracic kyphosis. Vertebrae: T1-2 disc and adjacent endplate hyperintensity. Cord: Normal signal and morphology. Epidural collection continues from the cervical levels into the upper thoracic spine, posteriorly terminating at approximately T3-4. Paraspinal and other soft tissues: Paravertebral edema at the upper thoracic levels related to cervical spine disease. Disc levels: Generalize bridging osteophytes and ankylosis. Upper thoracic degenerative facet spurring. No canal or foraminal impingement MRI LUMBAR SPINE FINDINGS Segmentation:  5 lumbar type vertebrae Alignment:  Mild degenerative anterolisthesis at L4-5 Vertebrae: No visible fracture, discitis, or aggressive bone lesion. Conus medullaris and cauda equina: Conus extends to the T12-L1 level. Conus and cauda equina appear normal. Paraspinal and other soft tissues: Cystic appearing spaces in the presacral space on the right intimately associated with piriformis. The sacroiliac joints are largely on covered. No visible adjacent marrow edema Disc levels: T12- L1: Disc narrowing and bulging.  Mild facet spurring L1-L2: Disc narrowing and bulging.  Mild facet spurring L2-L3: Disc narrowing and bulging with degenerative facet spurring. Left subarticular recess stenosis that could affect the L3 nerve root L3-L4: Disc narrowing and bulging with degenerative facet spurring L4-L5:  Facet osteoarthritis with spurring and anterolisthesis. The disc is narrowed and bulging. Moderate spinal stenosis. L5-S1:Facet osteoarthritis with  bulky spurring. Mild disc space narrowing. Case discussed with Dr. Amada Jupiter. IMPRESSION: 1. 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 2. Suspected inflammation/collection partially covered in the right pelvis along the piriformis, possible septic arthritis of the right sacroiliac joint which is only minimally covered. Recommend dedicated pelvis MRI. Electronically Signed   By: Marnee Spring M.D.   On: 06/16/2021 04:44   MR LUMBAR SPINE WO CONTRAST  Result Date: 06/16/2021 CLINICAL DATA:  Cervical radiculopathy. Infection suspected. Elevated CRP and leukocytosis. EXAM: MRI CERVICAL, THORACIC AND LUMBAR SPINE WITHOUT CONTRAST TECHNIQUE: Multiplanar and multiecho pulse sequences of the cervical spine, to include the craniocervical junction and cervicothoracic junction, and thoracic and lumbar spine, were obtained without intravenous contrast. COMPARISON:  Cervical spine CT from admission FINDINGS: MRI CERVICAL SPINE FINDINGS Alignment: Exaggerated cervical lordosis with mild C7-T1 anterolisthesis. Vertebrae: Subtle marrow edema at the C5-C7 vertebral levels. There is disc T2 hyperintensity at C5-6 and C6-7 with extensive prevertebral edema and posterior-lateral paravertebral edema. Cord: Multilevel deformity from epidural collection extending from approximately C2-T4, more inferiorly extensive posteriorly. No detected cord edema Posterior Fossa, vertebral arteries, paraspinal tissues: As above Disc levels: Given patient motion, evaluation of degenerative disease is limited. There is diffuse degenerative facet spurring. Milder disc degeneration with generalized narrowing and bulging. MRI THORACIC SPINE FINDINGS Alignment:  Exaggerated thoracic kyphosis.  Vertebrae: T1-2 disc and adjacent endplate hyperintensity. Cord: Normal signal and morphology. Epidural collection continues from the cervical levels into the upper thoracic spine, posteriorly terminating at approximately T3-4. Paraspinal and other soft tissues: Paravertebral edema at the upper thoracic levels related to cervical spine disease. Disc levels: Generalize bridging osteophytes and ankylosis. Upper thoracic degenerative facet spurring. No canal or foraminal impingement MRI LUMBAR SPINE FINDINGS Segmentation:  5 lumbar type vertebrae Alignment:  Mild degenerative anterolisthesis at L4-5 Vertebrae: No visible fracture, discitis, or aggressive bone lesion. Conus medullaris and cauda equina: Conus extends to the T12-L1 level. Conus and cauda equina appear normal. Paraspinal and other soft tissues: Cystic appearing spaces in the presacral space on the right intimately associated with piriformis. The sacroiliac joints are largely on covered. No visible adjacent marrow edema Disc levels: T12- L1: Disc narrowing and bulging.  Mild facet spurring L1-L2: Disc narrowing and bulging.  Mild facet spurring L2-L3: Disc narrowing and bulging with degenerative facet spurring. Left subarticular recess stenosis that could affect the L3 nerve root L3-L4: Disc narrowing and bulging with degenerative facet spurring L4-L5: Facet osteoarthritis with spurring and anterolisthesis. The disc is narrowed and bulging. Moderate spinal stenosis. L5-S1:Facet osteoarthritis with bulky spurring. Mild disc space narrowing. Case discussed with Dr. Amada Jupiter. IMPRESSION: 1. 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 2. Suspected inflammation/collection partially covered in the right pelvis along the piriformis, possible septic arthritis of the right sacroiliac joint which is only minimally covered. Recommend dedicated pelvis MRI.  Electronically Signed   By: Marnee Spring M.D.   On: 06/16/2021 04:44   DG CHEST PORT 1 VIEW  Result Date: 06/14/2021 CLINICAL DATA:  Leukocytosis EXAM: PORTABLE CHEST 1 VIEW COMPARISON:  06/11/2020, 05/14/2008 FINDINGS: The heart size and mediastinal contours are within normal limits. Both lungs are clear. The visualized skeletal structures are unremarkable. IMPRESSION: No active disease. Electronically Signed   By: Jasmine Pang M.D.   On: 06/14/2021 18:30   DG Abd Portable 1V  Result  Date: 06/21/2021 CLINICAL DATA:  Nasogastric tube placement. EXAM: PORTABLE ABDOMEN - 1 VIEW COMPARISON:  None. FINDINGS: The bowel gas pattern is normal. Feeding tube tip is seen in expected position of distal stomach. No radio-opaque calculi or other significant radiographic abnormality are seen. IMPRESSION: Feeding tube tip seen in expected position of distal stomach. Electronically Signed   By: Lupita Raider M.D.   On: 06/21/2021 14:39   DG Abd Portable 1V  Result Date: 06/21/2021 CLINICAL DATA:  Evaluate feeding tube placement EXAM: PORTABLE ABDOMEN - 1 VIEW COMPARISON:  06/17/2021 FINDINGS: The feeding tube tip is 8.2 cm below the GE junction in the expected location of the gastric fundus. Normal bowel gas pattern. IMPRESSION: Feeding tube tip in the gastric fundus. Electronically Signed   By: Signa Kell M.D.   On: 06/21/2021 11:11   DG Abd Portable 1V  Result Date: 06/17/2021 CLINICAL DATA:  Feeding tube placement. EXAM: PORTABLE ABDOMEN - 1 VIEW COMPARISON:  None. FINDINGS: Tip of the weighted enteric tube in the right upper quadrant in the region of the distal stomach. There is air within nondilated bowel loops in the upper abdomen. No obvious free air. IMPRESSION: Tip of the weighted enteric tube in the right upper quadrant in the region of the distal stomach. Electronically Signed   By: Narda Rutherford M.D.   On: 06/17/2021 15:01   DG Swallowing Func-Speech Pathology  Result Date:  06/24/2021 Table formatting from the original result was not included. Objective Swallowing Evaluation: Type of Study: MBS-Modified Barium Swallow Study  Patient Details Name: Kristy [name] MRN: [id number] Date of Birth: July 31, 1946 Today's Date: 06/24/2021 Time: SLP Start Time (ACUTE ONLY): 1230 -SLP Stop Time (ACUTE ONLY): 1300 SLP Time Calculation (min) (ACUTE ONLY): 30 min Past Medical History: No past medical history on file. Past Surgical History: Past Surgical History: Procedure Laterality Date  ANTERIOR CERVICAL DECOMP/DISCECTOMY FUSION N/A 06/16/2021  Procedure: ANTERIOR CERVICAL DISCECTOMY FUSION Cervical five -Cervical six, Evacuation of Epidural Abscess;  Surgeon: Jadene Pierini, MD;  Location: [facility] OR;  Service: Neurosurgery;  Laterality: N/A; HPI: Pt is a 75yo female presenting to [facility] ED on 8/1 after a fall on 7/29. Pt also fell on 8/2 while in the ED. Pt found to be hypokalemic upon admission. 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 Pt is now s/p drainage of abscess and C5-6 ACDF on 06/16/21. Also noted R SI septic arthritis.  PMH: HTN, polycthemia, alcohol use.  Subjective: alerts to name, responds to questions Assessment / Plan / Recommendation CHL IP CLINICAL IMPRESSIONS 06/24/2021 Clinical Impression Ms. Chason presents with a moderate dysphagia, impacted by both mechanical and cognitive components.  She was intermittently lethargic throughout the assessment.  Initially, she held her neck in deep flexion, which appeared to offer some protection when swallowing thin liquids - there was no penetration nor aspiration.  Later in the study, when sleepier and when head was in extension and neutral, thin liquids were immediately aspirated with an ensuing cough response. Purees sat in pharynx and pt was unable to move them due to poor wakefulness at that point in the study. Study was terminated and oral suctioning was provided.   Recommend continuing intensive oral care; offer ice chips after oral care.  Maintain NPO status for now.  SLP will follow for therapeutic PO trials -  as mentation improves we will have a clearer understanding of potential impact of ACDF/prevertebral swelling on swallowing.  Marland Kitchen SLP  Visit Diagnosis Dysphagia, pharyngeal phase (R13.13) Attention and concentration deficit following -- Frontal lobe and executive function deficit following -- Impact on safety and function Moderate aspiration risk   CHL IP TREATMENT RECOMMENDATION 06/24/2021 Treatment Recommendations Therapy as outlined in treatment plan below   Prognosis 06/24/2021 Prognosis for Safe Diet Advancement Fair Barriers to Reach Goals Cognitive deficits Barriers/Prognosis Comment -- CHL IP DIET RECOMMENDATION 06/24/2021 SLP Diet Recommendations NPO Liquid Administration via -- Medication Administration -- Compensations -- Postural Changes --   CHL IP OTHER RECOMMENDATIONS 06/24/2021 Recommended Consults -- Oral Care Recommendations Oral care QID Other Recommendations --   CHL IP FOLLOW UP RECOMMENDATIONS 06/24/2021 Follow up Recommendations Skilled Nursing facility   [facility] IP FREQUENCY AND DURATION 06/24/2021 Speech Therapy Frequency (ACUTE ONLY) min 2x/week Treatment Duration 2 weeks      CHL IP ORAL PHASE 06/24/2021 Oral Phase WFL Oral - Pudding Teaspoon -- Oral - Pudding Cup -- Oral - Honey Teaspoon -- Oral - Honey Cup -- Oral - Nectar Teaspoon -- Oral - Nectar Cup -- Oral - Nectar Straw -- Oral - Thin Teaspoon -- Oral - Thin Cup -- Oral - Thin Straw -- Oral - Puree -- Oral - Mech Soft -- Oral - Regular -- Oral - Multi-Consistency -- Oral - Pill -- Oral Phase - Comment --  CHL IP PHARYNGEAL PHASE 06/24/2021 Pharyngeal Phase Impaired Pharyngeal- Pudding Teaspoon -- Pharyngeal -- Pharyngeal- Pudding Cup -- Pharyngeal -- Pharyngeal- Honey Teaspoon -- Pharyngeal -- Pharyngeal- Honey Cup -- Pharyngeal -- Pharyngeal- Nectar Teaspoon -- Pharyngeal -- Pharyngeal- Nectar Cup  -- Pharyngeal -- Pharyngeal- Nectar Straw -- Pharyngeal -- Pharyngeal- Thin Teaspoon -- Pharyngeal -- Pharyngeal- Thin Cup -- Pharyngeal -- Pharyngeal- Thin Straw -- Pharyngeal -- Pharyngeal- Puree -- Pharyngeal -- Pharyngeal- Mechanical Soft -- Pharyngeal -- Pharyngeal- Regular -- Pharyngeal -- Pharyngeal- Multi-consistency -- Pharyngeal -- Pharyngeal- Pill -- Pharyngeal -- Pharyngeal Comment --  No flowsheet data found. [name] 06/24/2021, 3:12 PM              DG C-Arm 1-60 Min  Result Date: 06/16/2021 CLINICAL DATA:  Neck pain EXAM: DG CERVICAL SPINE - 1 VIEW; DG C-ARM 1-60 MIN COMPARISON:  MRI 06/16/2021 FINDINGS: Single view intraoperative radiograph of the cervical spine demonstrates cervical spine from the occiput foot through C5-6. C5-6 anterior cervical discectomy and fusion with instrumentation has been performed. Normal alignment. Ray-Tec is seen within the soft tissues anterior to the fusion. Endotracheal tube is in place. Fluoroscopy time: 3 seconds Dose: 0.25 mGy Images: 1 IMPRESSION: ACDF C5-6.  Ray-Tec in place within the anterior soft tissues. Electronically Signed   By: Helyn Numbers MD   On: 06/16/2021 15:04   ECHOCARDIOGRAM COMPLETE  Result Date: 06/18/2021    ECHOCARDIOGRAM REPORT   Patient Name:   ADREONA BRAND Date of Exam: 06/18/2021 Medical Rec #:  [id number]     Height:       66.0 in Accession #:    [id number]    Weight:       188.0 lb Date of Birth:  09/28/1946      BSA:          1.948 m Patient Age:    75 years      BP:           133/70 mmHg Patient Gender: F             HR:           98 bpm. Exam Location:  Inpatient Procedure:  2D Echo, Cardiac Doppler and Color Doppler Indications:    Epidural abscess [[id number]]  History:        Patient has no prior history of Echocardiogram examinations.  Sonographer:    Renella Cunas RDCS Referring Phys: [id number] [facility] IMPRESSIONS  1. Left ventricular ejection fraction, by estimation, is 65 to 70%. The left ventricle has normal  function. The left ventricle has no regional wall motion abnormalities. Left ventricular diastolic parameters are consistent with Grade I diastolic dysfunction (impaired relaxation).  2. Right ventricular systolic function is normal. The right ventricular size is normal. There is normal pulmonary artery systolic pressure.  3. The mitral valve is normal in structure. Mild mitral valve regurgitation. No evidence of mitral stenosis.  4. The aortic valve is normal in structure. Aortic valve regurgitation is mild. No aortic stenosis is present.  5. The inferior vena cava is normal in size with greater than 50% respiratory variability, suggesting right atrial pressure of 3 mmHg. FINDINGS  Left Ventricle: Left ventricular ejection fraction, by estimation, is 65 to 70%. The left ventricle has normal function. The left ventricle has no regional wall motion abnormalities. The left ventricular internal cavity size was normal in size. There is  no left ventricular hypertrophy. Left ventricular diastolic parameters are consistent with Grade I diastolic dysfunction (impaired relaxation). Right Ventricle: The right ventricular size is normal. No increase in right ventricular wall thickness. Right ventricular systolic function is normal. There is normal pulmonary artery systolic pressure. The tricuspid regurgitant velocity is 2.44 m/s, and  with an assumed right atrial pressure of 3 mmHg, the estimated right ventricular systolic pressure is 26.8 mmHg. Left Atrium: Left atrial size was normal in size. Right Atrium: Right atrial size was normal in size. Pericardium: There is no evidence of pericardial effusion. Mitral Valve: The mitral valve is normal in structure. Mild mitral valve regurgitation, with posteriorly-directed jet. No evidence of mitral valve stenosis. Tricuspid Valve: The tricuspid valve is normal in structure. Tricuspid valve regurgitation is mild . No evidence of tricuspid stenosis. Aortic Valve: The aortic valve is  normal in structure. Aortic valve regurgitation is mild. No aortic stenosis is present. Pulmonic Valve: The pulmonic valve was normal in structure. Pulmonic valve regurgitation is not visualized. No evidence of pulmonic stenosis. Aorta: The aortic root is normal in size and structure. Venous: The inferior vena cava is normal in size with greater than 50% respiratory variability, suggesting right atrial pressure of 3 mmHg. IAS/Shunts: No atrial level shunt detected by color flow Doppler.  LEFT VENTRICLE PLAX 2D LVIDd:         4.60 cm     Diastology LVIDs:         3.50 cm     LV e' medial:    6.96 cm/s LV PW:         1.10 cm     LV E/e' medial:  10.2 LV IVS:        1.10 cm     LV e' lateral:   7.62 cm/s LVOT diam:     2.20 cm     LV E/e' lateral: 9.4 LV SV:         92 LV SV Index:   47 LVOT Area:     3.80 cm  LV Volumes (MOD) LV vol d, MOD A2C: 79.4 ml LV vol d, MOD A4C: 83.8 ml LV vol s, MOD A2C: 32.1 ml LV vol s, MOD A4C: 33.3 ml LV SV MOD A2C:     47.3  ml LV SV MOD A4C:     83.8 ml LV SV MOD BP:      51.4 ml RIGHT VENTRICLE RV S prime:     17.40 cm/s TAPSE (M-mode): 2.2 cm LEFT ATRIUM           Index       RIGHT ATRIUM           Index LA diam:      3.30 cm 1.69 cm/m  RA Area:     13.30 cm LA Vol (A2C): 37.9 ml 19.46 ml/m RA Volume:   34.40 ml  17.66 ml/m LA Vol (A4C): 24.6 ml 12.63 ml/m  AORTIC VALVE LVOT Vmax:   131.00 cm/s LVOT Vmean:  81.400 cm/s LVOT VTI:    0.243 m  AORTA Ao Root diam: 3.30 cm MITRAL VALVE                TRICUSPID VALVE MV Area (PHT): 2.67 cm     TR Peak grad:   23.8 mmHg MV Decel Time: 284 msec     TR Vmax:        244.00 cm/s MR Peak grad: 82.4 mmHg MR Vmax:      454.00 cm/s   SHUNTS MV E velocity: 71.30 cm/s   Systemic VTI:  0.24 m MV A velocity: 102.00 cm/s  Systemic Diam: 2.20 cm MV E/A ratio:  0.70 Donato Schultz MD Electronically signed by Donato Schultz MD Signature Date/Time: 06/18/2021/1:08:50 PM    Final    VAS [place] UPPER EXTREMITY VENOUS DUPLEX  Result Date: 06/19/2021 UPPER VENOUS  STUDY  Patient Name:  Kristy [name]  Date of Exam:   06/19/2021 Medical Rec #: [id number]      Accession #:    [id number] Date of Birth: 25-Jul-1946       Patient Gender: F Patient Age:   62 years Exam Location:  [facility] Procedure:      VAS [place] UPPER EXTREMITY VENOUS DUPLEX Referring Phys: Odette Fraction --------------------------------------------------------------------------------  Indications: Pain, and Swelling Risk Factors: IV's in LUE (hand and forearm). Limitations: Poor ultrasound/tissue interface and IV bandage, and patient pain intolerance. Comparison Study: No previous exams Performing Technologist: Jody Hill RVT, RDMS  Examination Guidelines: A complete evaluation includes B-mode imaging, spectral Doppler, color Doppler, and power Doppler as needed of all accessible portions of each vessel. Bilateral testing is considered an integral part of a complete examination. Limited examinations for reoccurring indications may be performed as noted.  Right Findings: +----------+------------+---------+-----------+----------+-------+ RIGHT     CompressiblePhasicitySpontaneousPropertiesSummary +----------+------------+---------+-----------+----------+-------+ Subclavian    Full       Yes       Yes                      +----------+------------+---------+-----------+----------+-------+  Left Findings: +----------+------------+---------+-----------+----------+-------+ LEFT      CompressiblePhasicitySpontaneousPropertiesSummary +----------+------------+---------+-----------+----------+-------+ IJV           Full       Yes       Yes                      +----------+------------+---------+-----------+----------+-------+ Subclavian    Full       Yes       Yes                      +----------+------------+---------+-----------+----------+-------+ Axillary      Full       Yes       Yes                      +----------+------------+---------+-----------+----------+-------+  Brachial      Full       Yes       Yes                      +----------+------------+---------+-----------+----------+-------+ Radial        Full                                          +----------+------------+---------+-----------+----------+-------+ Ulnar         Full                                          +----------+------------+---------+-----------+----------+-------+ Cephalic      Full                                          +----------+------------+---------+-----------+----------+-------+ Basilic       Full       Yes       Yes                      +----------+------------+---------+-----------+----------+-------+ Limited visualization of ulnar and radial veins due to IV placement, edema, and patient pain intolerance.  Summary:  Right: No evidence of thrombosis in the subclavian.  Left: No evidence of deep vein thrombosis in the upper extremity. No evidence of superficial vein thrombosis in the upper extremity.  *See table(s) above for measurements and observations.  Diagnosing physician: Coral ElseVance Brabham MD Electronically signed by Coral ElseVance Brabham MD on 06/19/2021 at 6:54:25 PM.    Final     Labs:  CBC: Recent Labs    06/24/21 1042 06/26/21 0753 06/27/21 0543 06/27/21 1336 06/27/21 1932 06/28/21 0639  WBC 9.9 9.7 7.6  --   --  7.4  HGB 9.8* 7.9* 8.0* 6.9* 7.1* 6.7*  HCT 30.7* 25.5* 26.1* 21.6* 22.4* 21.4*  PLT 105* 113* 106*  --   --  109*    COAGS: Recent Labs    06/10/21 2105 06/28/21 0639  INR 1.2 1.4*    BMP: Recent Labs    06/25/21 0329 06/26/21 0753 06/27/21 0214 06/28/21 0639  NA 156* 159* 152* 149*  K 3.1* 3.5 3.3* 3.4*  CL 121* 125* 120* 117*  CO2 26 26 25 25   GLUCOSE 147* 150* 115* 171*  BUN 82* 77* 67* 48*  CALCIUM 8.4* 8.3* 8.0* 8.0*  CREATININE 1.81* 1.73* 1.47* 1.58*  GFRNONAA 29* 30* 37* 34*    LIVER FUNCTION TESTS: Recent Labs    06/12/21 0333 06/14/21 2013 06/19/21 0412 06/23/21 0511 06/28/21 0639   BILITOT 3.3* 6.5* 3.0* 4.7*  --   AST 138* 120* 168* 51*  --   ALT 199* 136* 150* 38  --   ALKPHOS 92 104 133* 105  --   PROT 6.4* 6.9 5.7* 4.6*  --   ALBUMIN 1.8* 1.6* 1.2* <1.0* 1.2*    TUMOR MARKERS: No results for input(s): AFPTM, CEA, CA199, CHROMGRNA in the last 8760 hours.  Assessment and Plan:  Hx of ETOH abuse, falls, gen weakness, UTI and AMS. She presented to the ED 06/10/21 c/o fall 3 days PTA. 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 Also  a cervical epidural abscess from C2-T3-4.   She was referred for consult for gastrostomy tube placement d/t inadequate PO intake and severe electrolyte imbalance/malnutrition.   Na+ remains elevated today @ 149. K+ improved 3.4. BUN 48, creat 1.58. WBC WNL, afebrile.   Hgb 6.7. Plat 109, INR 1.4.   Eliquis stopped 06/27/21 w/ last dose 8/18 w/ 3 day delay for procedures.  Heparin gtt to be stopped 6 hrs prior to procedure. Plan to proceed with g-tube placement next week.   Risks and benefits image guided gastrostomy tube placement was discussed with the patient's daughter via telephone including, but not limited to the need for a barium enema during the procedure, bleeding, infection, peritonitis and/or damage to adjacent structures.  All of the patient's daughters questions were answered, patient daughter is agreeable to proceed.  Consent obtained via telephone by patients daughter, Channelle Bottger, signed and in chart.   Thank you for this interesting consult.  I greatly enjoyed meeting Kristy [name] and look forward to participating in their care.  A copy of this report was sent to the requesting provider on this date.  Electronically Signed: Shon Hough, NP 06/28/2021, 10:32 AM   I spent a total of 30 minutes in face to face in clinical consultation, greater than 50% of which was counseling/coordinating care for gastrostomy tube placement.

## 2021-06-28 NOTE — Progress Notes (Signed)
Nutrition Follow-up  DOCUMENTATION CODES:  Non-severe (moderate) malnutrition in context of chronic illness  INTERVENTION:  Continue TF via Cortrak: -Osmolite 1.2 cal @ 68m/hr (15642md)  -4519mrosource TF BID -Free water flushes per MD, currently 200m37mH  Provides 1952 kcals, 108 grams protein, 1279ml66me water (2479ml 43ml free water with flushes)  May continue these TF orders once PEG placed and ready for use  NUTRITION DIAGNOSIS:  Moderate Malnutrition related to chronic illness as evidenced by moderate muscle depletion, moderate fat depletion, mild muscle depletion, mild fat depletion. -- ongoing  GOAL:  Patient will meet greater than or equal to 90% of their needs -- met with TF  MONITOR:  TF tolerance, Labs, I & O's  REASON FOR ASSESSMENT:  Consult Enteral/tube feeding initiation and management  ASSESSMENT:  Pt with a PMH significant for h/o EtOH abuse who presented to the ED with frequent falls, generalized weakness, and confusion, and was admitted with acute delirium and severe sepsis 2/2 cervical epidural abscess (C2-T3/T4); cervical discitis/osteomyelitis; UTI. Pt has since become less interactive and more confused and has started retaining urine. Neurology was consulted with worsening mental status and nuchal rigidity. LP under fluoro requested, empirically treating meningitis/encephalitis.  8/06-s/p unsuccessful LP 8/07-s/p C5-C6 anterior cervical discectomy and instrumented fusion  8/08-Cortrak placed 8/12-Cortrak replaced (gastric tip per xray)  Meningitis has been ruled out per MD.  Pt remains NPO with TF via Cortrak. SLP following. Plans for PEG placement and PRBC infusion.   Current TF: osmolite 1.2 @ 65ml/h12m5ml pr68mrce TF BID, 200ml fre71mter Q4H  Medications: folvite, mvi with minerals, thiamine Labs: Na 149 (H), K+ 3.4 (L), Cr 1.58 (H), Hgb 6.7 (L) CBGs [id number] x24 37ms I/O: +3.4L since admit   Admission weight: 85.5  kg Current weight: 85.7 kg  Diet Order:   Diet Order             Diet NPO time specified  Diet effective now                  EDUCATION NEEDS:  Not appropriate for education at this time  Skin:  Skin Assessment: Skin Integrity Issues: Skin Integrity Issues:: Incisions Incisions: neck  Last BM:  8/16  Height:  Ht Readings from Last 1 Encounters:  06/14/21 '5\' 6"'  (1.676 m)   Weight:  Wt Readings from Last 1 Encounters:  06/28/21 85.7 kg   BMI:  Body mass index is 30.49 kg/m.  Estimated Nutritional Needs:  Kcal:  1900-2100 Protein:  100-120 grams Fluid:  >1.9L/d  Kristy [name] AveLarkin InaLDN (she/her/hers) RD pager number and weekend/on-call pager number located in [email or web address]

## 2021-06-28 NOTE — Progress Notes (Signed)
Stop tube feeding midnight Tuesday 8/23. May give morning meds w/ sips, for placement of Gastro tube

## 2021-06-28 NOTE — Progress Notes (Signed)
Patient Hgb 6.7, [place] paged.

## 2021-06-28 NOTE — Progress Notes (Signed)
Occupational Therapy Treatment Note  Opens eyes to name. Apparently delirious as she was waiting on a bus to take her to school. Moans/yells with any movement, including elevation of HOB. Follows commands inconsistently. Moving hands/elbows minimally  but not shoulders. Total A for all ADL. Total A +2 for bed mobility. Discussed issues with pain with NP. Goals down graded. Will need SNF.    06/28/21 1200  OT Visit Information  Last OT Received On 06/28/21  Assistance Needed +2  History of Present Illness Pt is a 75yo female presenting to [facility] ED on 8/1 after a fall on 7/29. Pt also fell on 8/2 while in the ED. Pt found to be hypokalemic upon admission. 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 Pt is now s/p drainage of abscess and C5-6 ACDF on 06/16/21. Also noted R SI septic arthritis.  PMH: HTN, polycthemia, alcohol use.  Precautions  Precautions Fall;Cervical;Other (comment)  Precaution Comments NG  Pain Assessment  Pain Assessment Faces  Faces Pain Scale 8  Pain Location moaning/crying with minimal movement  Pain Descriptors / Indicators Grimacing  Pain Intervention(s) Limited activity within patient's tolerance;Repositioned  Cognition  Arousal/Alertness Lethargic  Behavior During Therapy Flat affect;Restless  Overall Cognitive Status Impaired/Different from baseline  Area of Impairment Orientation;Attention;Memory;Following commands;Safety/judgement;Awareness;Problem solving  Orientation Level Disoriented to;Place;Time;Situation  Current Attention Level Focused  Memory Decreased recall of precautions;Decreased short-term memory  Following Commands Follows one step commands inconsistently  Safety/Judgement Decreased awareness of safety;Decreased awareness of deficits  Awareness Intellectual  Problem Solving Slow processing;Decreased initiation;Difficulty sequencing;Requires verbal cues;Requires tactile cues   General Comments Apparently delirius. Pt was waiting on a bus to take her to school.  ADL  General ADL Comments total A with all ADL  Bed Mobility  General bed mobility comments total A +2 with any attempts at bed mobility; HOB raised to 47 degrees; pt initially moaning/yelling however calmed in very short amount of time  Balance  Sitting balance-Leahy Scale Zero  Transfers  General transfer comment unable  at this time  Exercises  Exercises General Upper Extremity  General Exercises - Upper Extremity  Shoulder Flexion Both;Supine;PROM  Elbow Flexion PROM;AAROM;Both;10 reps;Supine  Elbow Extension PROM;AAROM;Both;10 reps;Supine  Shoulder ABduction Both;PROM;10 reps  Digit Composite Flexion PROM;AAROM;Both;10 reps  Composite Extension PROM;AAROM;Both;10 reps;Supine  Other Exercises  Other Exercises head positioned to reduce cervical extension and right side flexion/rotation with head in good posiiton in midline  OT - End of Session  Activity Tolerance Patient limited by fatigue;Patient limited by lethargy;Patient limited by pain  Patient left in bed;with call bell/phone within reach;with chair alarm set  Nurse Communication Mobility status  OT Assessment/Plan  OT Plan Discharge plan remains appropriate  OT Visit Diagnosis Unsteadiness on feet (R26.81);Other abnormalities of gait and mobility (R26.89);Repeated falls (R29.6);Muscle weakness (generalized) (M62.81);Other symptoms and signs involving cognitive function;Pain  Pain - part of body  (all over with any movement)  Follow Up Recommendations SNF  OT Equipment 3 in 1 bedside commode;Other (comment)  AM-PAC OT "6 Clicks" Daily Activity Outcome Measure (Version 2)  Help from another person eating meals? 1  Help from another person taking care of personal grooming? 1  Help from another person toileting, which includes using toliet, bedpan, or urinal? 1  Help from another person bathing (including washing, rinsing, drying)? 1   Help from another person to put on and taking off regular upper body clothing? 1  Help from another person to put on and taking  off regular lower body clothing? 1  6 Click Score 6  Progressive Mobility  What is the highest level of mobility based on the progressive mobility assessment? Level 1 (Bedfast) - Unable to balance while sitting on edge of bed  Mobility Sit up in bed/chair position for meals (nutrition via tube feed, pt is in chair position)  OT Goal Progression  Progress towards OT goals Not progressing toward goals - comment;Goals drowngraded-see care plan  Acute Rehab OT Goals  Patient Stated Goal per family for pt to get better  OT Goal Formulation Patient unable to participate in goal setting  Time For Goal Achievement 07/09/21  Potential to Achieve Goals Fair  OT Time Calculation  OT Start Time (ACUTE ONLY) 1218  OT Stop Time (ACUTE ONLY) 1236  OT Time Calculation (min) 18 min  OT General Charges  $OT Visit 1 Visit  OT Treatments  $Therapeutic Activity 8-22 mins  Luisa Dago, OT/L   Acute OT Clinical Specialist Acute Rehabilitation Services Pager [phone] Office [phone]

## 2021-06-28 NOTE — Plan of Care (Signed)
  Problem: Education: Goal: Knowledge of General Education information will improve Description: Including pain rating scale, medication(s)/side effects and non-pharmacologic comfort measures Outcome: Progressing  Will continue to update patient and family on medication and rehab progress as needed.

## 2021-06-29 LAB — BASIC METABOLIC PANEL
Anion gap: 8 (ref 5–15)
BUN: 42 mg/dL — ABNORMAL HIGH (ref 8–23)
CO2: 22 mmol/L (ref 22–32)
Calcium: 7.8 mg/dL — ABNORMAL LOW (ref 8.9–10.3)
Chloride: 111 mmol/L (ref 98–111)
Creatinine, Ser: 1.56 mg/dL — ABNORMAL HIGH (ref 0.44–1.00)
GFR, Estimated: 34 mL/min — ABNORMAL LOW (ref >60)
Glucose, Bld: 186 mg/dL — ABNORMAL HIGH (ref 70–99)
Potassium: 3.4 mmol/L — ABNORMAL LOW (ref 3.5–5.1)
Sodium: 141 mmol/L (ref 135–145)

## 2021-06-29 LAB — GLUCOSE, CAPILLARY
Glucose-Capillary: 143 mg/dL — ABNORMAL HIGH (ref 70–99)
Glucose-Capillary: 154 mg/dL — ABNORMAL HIGH (ref 70–99)
Glucose-Capillary: 165 mg/dL — ABNORMAL HIGH (ref 70–99)
Glucose-Capillary: 166 mg/dL — ABNORMAL HIGH (ref 70–99)
Glucose-Capillary: 171 mg/dL — ABNORMAL HIGH (ref 70–99)
Glucose-Capillary: 173 mg/dL — ABNORMAL HIGH (ref 70–99)
Glucose-Capillary: 196 mg/dL — ABNORMAL HIGH (ref 70–99)

## 2021-06-29 MED ORDER — ADULT MULTIVITAMIN LIQUID CH
15.0000 mL | Freq: Every day | ORAL | Status: DC
  Administered 2021-06-29 – 2021-07-11 (×14): 15 mL
  Filled 2021-06-29 (×13): qty 15

## 2021-06-29 MED ORDER — BIOTENE DRY MOUTH MT LIQD: 15.0000 mL | OROMUCOSAL | Status: DC | PRN

## 2021-06-29 NOTE — Progress Notes (Signed)
PROGRESS NOTE  Kristy [name]:[id number] DOB: 09/08/1946 DOA: 06/10/2021 PCP: Lujean Amel, MD   LOS: 18 days   Brief Narrative / Interim history: 75 year old female with history of EtOH use, admitted to the hospital 8/1 with frequent falls, generalized weakness and confusion.  She was found to be septic with MSSA bacteremia as well as having cervical epidural abscess/cervical discitis/osteomyelitis.  ID along with neurosurgery were consulted and she is status post I&D on 06/16/2021.  Hospital course complicated by persistent encephalopathy  Subjective / 24h Interval events: Appears asleep but wakes up when interacting with, no significant complaints  Assessment & Plan: Severe sepsis secondary to MSSA Cervical epidural abscess (C2-T3/4);  Cervical discitis/osteomyelitis with associated torticollis and cervical contract; meningitis ruled out UTI (POA);  - Neuro, ID, and Neurosurgery consulted -TTE: No vegetation/endocarditis ID initially concerned for possible systemic or central etiology given multiple sites of staph infection -S/P I/D on  06/16/21 - copious amounts Drainage involving longus colli with myositis, discitis, osteomyelitis, large epidural abscess s/p drainage cultures positive for MSSA - Nafcilln pharmacy dosed 8/07 - 06/30/21 FOLLOWED BY Cefazolin pharmacy dosed 8/22 - 08/11/2021 - Rifampin 600 mg BID to be continued throughout the treatment as she can tolerate for 8 weeks; End Dates: 8/21 for nafcillin; 10/02 for cefazolin   -Given underlying CKD 3B uncertain if patient would be an appropriate candidate for PICC line -8/17 added low-dose Skelaxin TID decreased pain and neck spasm as of 8/19 patient is now keeping head to the midline -Therapy sts pt in significant pain w/ any movement and this is affecting therapy sessions and sleep. Will cahnge from prn narcs to Oxy IR scheduled q 8 hrs   Acute metabolic encephalopathy 2/2 sepsis physiology and persistent  hypernatremia/associated profound dysphagia - Neurology consulted - bedside LP unable to be performed  - MRI remarkable for osteo/epidural abscess as above -SLP following and given altered mentation and overall weakness unable to allow oral intake at this time noting patient unable to swallow adequately -cortrack for feedings and free water/meds and as of 8/18 have consulted IR for PEG tube placement, will happen next week   Epistaxis -Had epistaxis 8/17 but unable to achieve hemostasis with conservative measures (Gore-Tex packing) -ENT/Dr. Lucia Gaskins evaluated patient and cauterized bleeding vessel in right anterior nostril with silver nitrate, aspirin and heparin stopped. -No further bleeding as of this morning on 8/19.  Patient did have some mild bleeding when she was sat upright in the bed and she leaned forward somewhat on 8/18 but no further recurrence   Macrocytosis /anemia of critical illness as well as associated chronic kidney disease -Did require a unit of packed red blood cells on 8/19 following a drop in hemoglobin to 6.7 after her epistaxis, hemoglobin improved appropriately   Thrombocytopenia  -Aspirin and heparin discontinued in context of epistaxis and likely until platelets fully recover to greater than 150,000 it is unlikely we will be able to resume anticoagulants for DVT prophylax   Hypernatremic/hyperchloremic, improving -Likely secondary to previous IV fluid boluses and concurrent IV medications  -Appears very dry noting continues to have elevated BUN and abnormal creatinine, low EGFR and sodium remains elevated at 159 (ECHO 8/9 also consistent with volume depletion noting CVP 3)-8/19 sodium has decreased to 149 -Continue D5 half-normal, decrease the rate to 50.  Seems to have responded well to albumin 3/19   AKI on stage IIIb CKD, stabilizing:  -Baseline renal function: BUN 24 and creatinine 1.5 with he GFR slightly greater than 30 -  Currently at baseline   UTI, POA,  resolved:  - Covered by above abx - treatment completed   Ambulatory dysfunction with fall at home, severe weakness - Secondary to above - PT/OT currently recommending SNF rehabilitation once acute infectious process under better control -    Alcoholic hepatitis  - Based on AST predominance of LFT elevations. Hepatitis panel nonreactive.  Family confirms that patient drank 2 beers per day prior to admission - No indication for prednisolone at this time. Continue trending. - AST/ALT downtrending - bilirubin increased marginally    Alcohol abuse:  - Per family - 2 beers per day of late, etoh level negative in ED.  - Cessation counseling to be repeated once pt more clear-headed. - Empirically supplementing thiamine, folate, MVM.   Hypokalemia -8/16 potassium was low at 3.1 and after repletion has increased to 3.5 -Have added low-dose potassium to maintenance fluids -Continue potassium per tube   Severe protein caloric malnutrition  -Continue tube feeds and free water flushes via cortrack - Albumin continues to downtrend despite increased tube feeds we will give 4 doses of albumin as above Nutrition Problem: Moderate Malnutrition Etiology: chronic illness Signs/Symptoms: moderate muscle depletion, moderate fat depletion, mild muscle depletion, mild fat depletion Interventions: Prostat, Tube feeding   HTN/grade 1 diastolic dysfunction: -Hold ARB given AKI -Continue Norvasc -Echo this admission with an EF of 65 to 70% with right 1 diastolic physiology, also noted with mild MR and AR   HLD: - Hold statin for now; AST has decreased to 51 and ALT normal at 38 total bili slightly elevated at 1.3    Scheduled Meds:  allopurinol  300 mg Per Tube Daily   amLODipine  10 mg Per Tube Daily   Chlorhexidine Gluconate Cloth  6 each Topical Daily   diclofenac Sodium  4 g Topical QID   folic acid  1 mg Per Tube Daily   free water  200 mL Per Tube Q4H   metaxalone  400 mg Per Tube TID    multivitamin  15 mL Per Tube Daily   oxyCODONE  2.5 mg Oral Q8H   oxymetazoline  2 spray Each Nare BID   rifampin  300 mg Oral Q12H   thiamine injection  100 mg Intravenous Daily   Continuous Infusions:   ceFAZolin (ANCEF) IV Stopped (06/28/21 1838)   dextrose 5 % and 0.45 % NaCl with KCl 20 mEq/L 100 mL/hr at 06/29/21 1020   dextrose     feeding supplement (OSMOLITE 1.2 CAL) 1,000 mL (06/27/21 1039)   Nafcillin 12g infusion in dextrose 5% 500 mL 20.8 mL/hr at 06/28/21 1756   PRN Meds:.acetaminophen (TYLENOL) oral liquid 160 mg/5 mL, antiseptic oral rinse, melatonin, naLOXone (NARCAN)  injection, ondansetron (ZOFRAN) IV, polyethylene glycol  Diet Orders (From admission, onward)     Start     Ordered   06/21/21 1451  Diet NPO time specified  Diet effective now        06/21/21 1450            DVT prophylaxis: Place and maintain sequential compression device Start: 06/27/21 0526 SCDs Start: 06/11/21 0509     Code Status: Full Code  Family Communication: No family at bedside  Status is: Inpatient  Remains inpatient appropriate because:Unsafe d/c plan  Dispo: The patient is from: Home              Anticipated d/c is to: SNF  Patient currently is not medically stable to d/c.   Difficult to place patient No  Level of care: Progressive  Consultants:  ID Neurosurgery IR  Procedures:  Cervical discectomy and fusion 8/7  Antimicrobials: Acyclovir on 8/5 and 8/6 Ampicillin 8/5 through 8/8 Ceftriaxone 8/5 through 8/9 Vancomycin 8/6 through 8/8 Nafcillin 8/9 >> Rifampin 8/12 >>    Objective: Vitals:   06/29/21 0432 06/29/21 0718 06/29/21 0741 06/29/21 0800  BP: 132/62 119/64 124/66 129/79  Pulse: (!) 111  (!) 106 (!) 106  Resp: 20  20 (!) 23  Temp: 98.8 F (37.1 C)  98.9 F (37.2 C)   TempSrc: Axillary  Axillary   SpO2: 97%  99% 99%  Weight:      Height:        Intake/Output Summary (Last 24 hours) at 06/29/2021 1147 Last data filed at  06/29/2021 0800 Gross per 24 hour  Intake 7470.06 ml  Output 2201 ml  Net 5269.06 ml   Filed Weights   06/24/21 0400 06/25/21 0336 06/28/21 0500  Weight: 78.6 kg 78.8 kg 85.7 kg    Examination:  Constitutional: NAD Eyes: no scleral icterus ENMT: Mucous membranes are moist.  Dried blood bilateral nostrils Neck: normal, supple Respiratory: clear to auscultation bilaterally, no wheezing, no crackles. Normal respiratory effort.  Cardiovascular: Regular rate and rhythm, no murmurs / rubs / gallops. No LE edema. Good peripheral pulses Abdomen: non distended, no tenderness. Bowel sounds positive.  Musculoskeletal: no clubbing / cyanosis.  Skin: no rashes Neurologic: Grossly nonfocal   Data Reviewed: I have independently reviewed following labs and imaging studies  CBC: Recent Labs  Lab 06/24/21 1042 06/26/21 0753 06/27/21 0543 06/27/21 1336 06/27/21 1932 06/28/21 0639 06/28/21 1758  WBC 9.9 9.7 7.6  --   --  7.4  --   HGB 9.8* 7.9* 8.0* 6.9* 7.1* 6.7* 8.2*  HCT 30.7* 25.5* 26.1* 21.6* 22.4* 21.4* 25.0*  MCV 102.7* 104.1* 104.0*  --   --  103.9*  --   PLT 105* 113* 106*  --   --  109*  --    Basic Metabolic Panel: Recent Labs  Lab 06/25/21 0329 06/26/21 0753 06/27/21 0214 06/28/21 0639 06/29/21 0140  NA 156* 159* 152* 149* 141  K 3.1* 3.5 3.3* 3.4* 3.4*  CL 121* 125* 120* 117* 111  CO2 '26 26 25 25 22  ' GLUCOSE 147* 150* 115* 171* 186*  BUN 82* 77* 67* 48* 42*  CREATININE 1.81* 1.73* 1.47* 1.58* 1.56*  CALCIUM 8.4* 8.3* 8.0* 8.0* 7.8*   Liver Function Tests: Recent Labs  Lab 06/23/21 0511 06/28/21 0639  AST 51*  --   ALT 38  --   ALKPHOS 105  --   BILITOT 4.7*  --   PROT 4.6*  --   ALBUMIN <1.0* 1.2*   Coagulation Profile: Recent Labs  Lab 06/28/21 0639  INR 1.4*   HbA1C: No results for input(s): HGBA1C in the last 72 hours. CBG: Recent Labs  Lab 06/28/21 1554 06/28/21 2006 06/29/21 0009 06/29/21 0436 06/29/21 0740  GLUCAP 136* 136* 143*  171* 173*    No results found for this or any previous visit (from the past 240 hour(s)).   Radiology Studies: No results found.  Marzetta Board, MD, PhD [facility]  Between 7 am - 7 pm I am available, please contact me via Amion (for emergencies) or Securechat (non urgent messages)  Between 7 pm - 7 am I am not available, please contact night coverage MD/APP via Amion

## 2021-06-30 ENCOUNTER — Inpatient Hospital Stay (HOSPITAL_COMMUNITY): Payer: Medicare Other

## 2021-06-30 LAB — CBC
HCT: 21.9 % — ABNORMAL LOW (ref 36.0–46.0)
Hemoglobin: 6.8 g/dL — CL (ref 12.0–15.0)
MCH: 30.4 pg (ref 26.0–34.0)
MCHC: 31.1 g/dL (ref 30.0–36.0)
MCV: 97.8 fL (ref 80.0–100.0)
Platelets: 114 10*3/uL — ABNORMAL LOW (ref 150–400)
RBC: 2.24 MIL/uL — ABNORMAL LOW (ref 3.87–5.11)
RDW: 19.9 % — ABNORMAL HIGH (ref 11.5–15.5)
WBC: 7.1 10*3/uL (ref 4.0–10.5)
nRBC: 0 % (ref 0.0–0.2)

## 2021-06-30 LAB — BASIC METABOLIC PANEL
Anion gap: 7 (ref 5–15)
BUN: 36 mg/dL — ABNORMAL HIGH (ref 8–23)
CO2: 21 mmol/L — ABNORMAL LOW (ref 22–32)
Calcium: 7.4 mg/dL — ABNORMAL LOW (ref 8.9–10.3)
Chloride: 108 mmol/L (ref 98–111)
Creatinine, Ser: 1.58 mg/dL — ABNORMAL HIGH (ref 0.44–1.00)
GFR, Estimated: 34 mL/min — ABNORMAL LOW (ref >60)
Glucose, Bld: 170 mg/dL — ABNORMAL HIGH (ref 70–99)
Potassium: 3.8 mmol/L (ref 3.5–5.1)
Sodium: 136 mmol/L (ref 135–145)

## 2021-06-30 LAB — GLUCOSE, CAPILLARY
Glucose-Capillary: 113 mg/dL — ABNORMAL HIGH (ref 70–99)
Glucose-Capillary: 121 mg/dL — ABNORMAL HIGH (ref 70–99)
Glucose-Capillary: 121 mg/dL — ABNORMAL HIGH (ref 70–99)
Glucose-Capillary: 126 mg/dL — ABNORMAL HIGH (ref 70–99)
Glucose-Capillary: 134 mg/dL — ABNORMAL HIGH (ref 70–99)
Glucose-Capillary: 162 mg/dL — ABNORMAL HIGH (ref 70–99)

## 2021-06-30 LAB — PREPARE RBC (CROSSMATCH)

## 2021-06-30 IMAGING — DX DG CHEST 1V PORT
1 series · 1 of 1 positions shown · non-contrast
Comparison: [DATE]

CLINICAL DATA: Chest xray ordered because of fever.

EXAM:
PORTABLE CHEST - 1 VIEW

[chest]
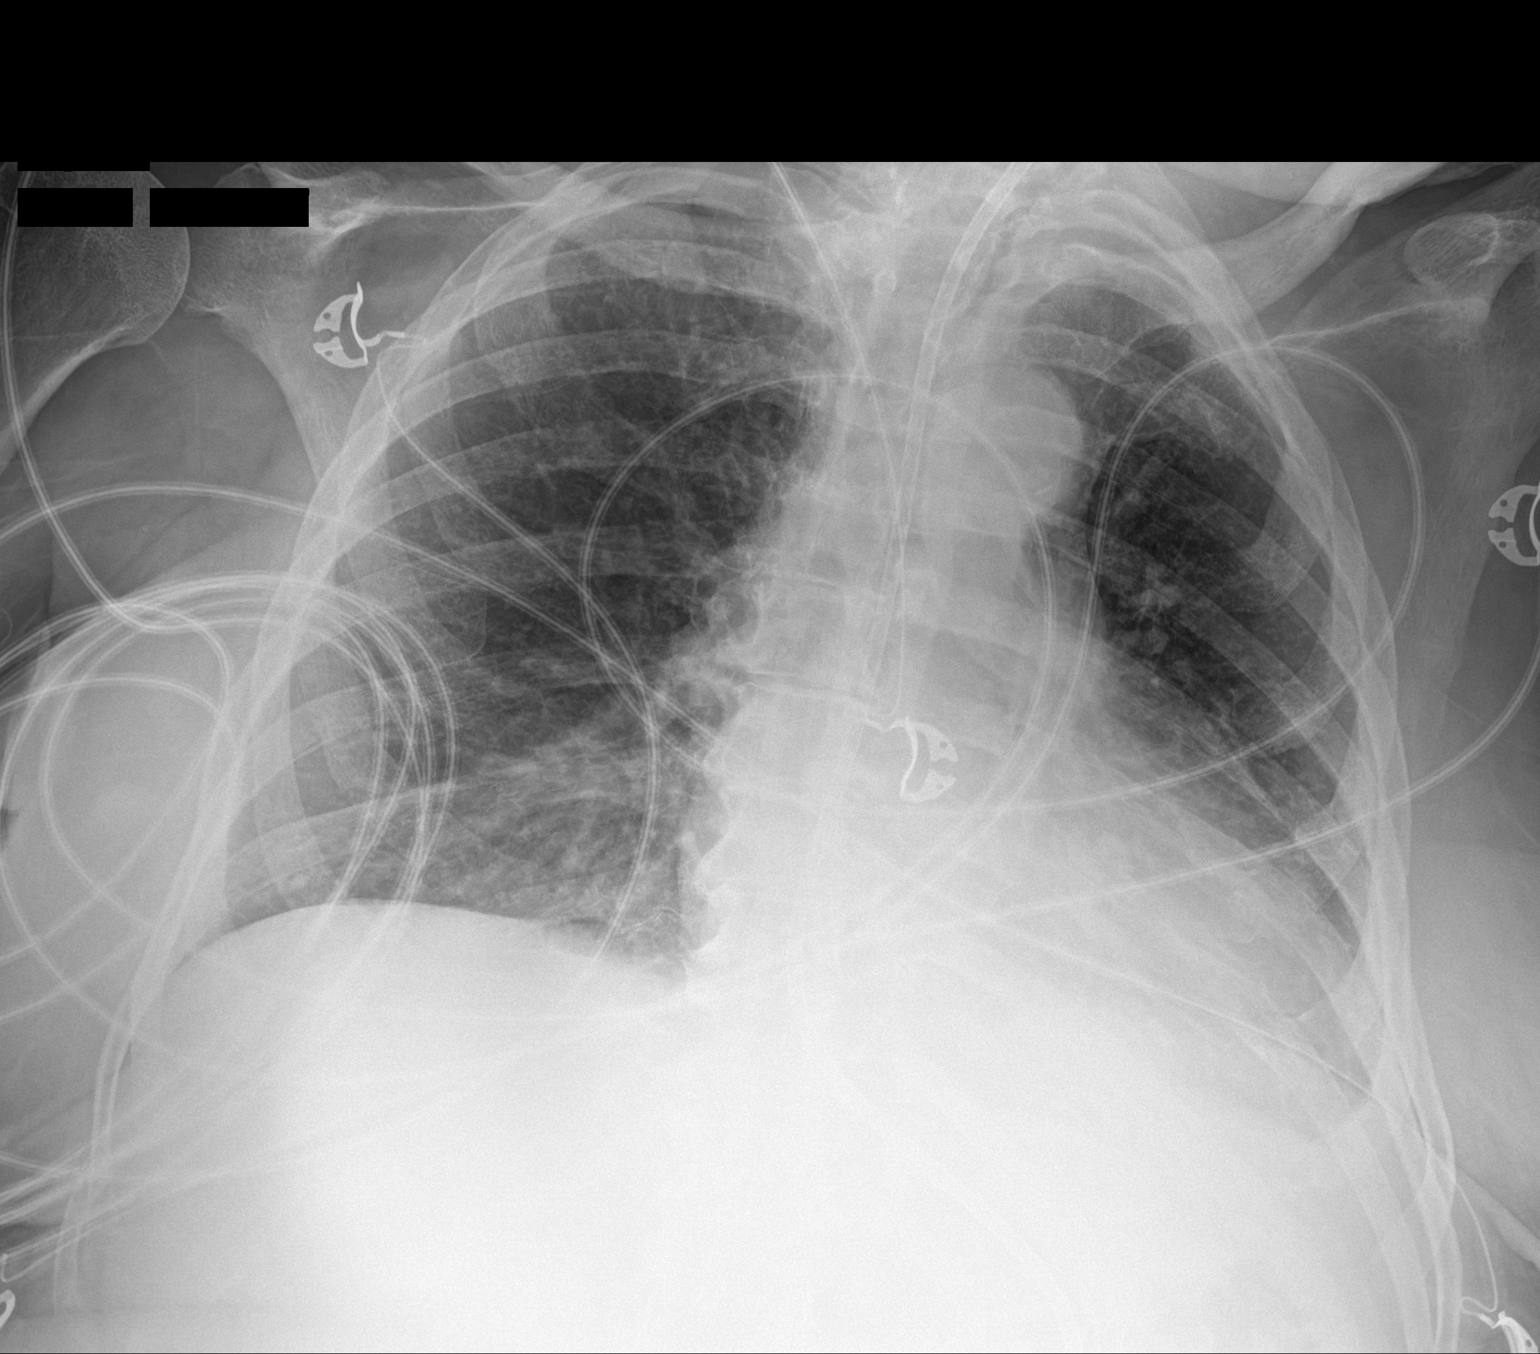

[1 of 1 positions shown; findings below may reference images not displayed]

FINDINGS: Increase in ill-defined interstitial and airspace opacities in the
lung bases, left greater than right.

Heart size and mediastinal contours are within normal limits.

Blunting of left lateral costophrenic angle suggesting small
effusion. No pneumothorax.

Surgical fixation hardware partially seen. Feeding tube extends at
least as far as the stomach, tip not seen.
IMPRESSION: Worsening bibasilar infiltrates or edema, left greater than right.

Possible small left effusion.

## 2021-06-30 MED ORDER — FUROSEMIDE 10 MG/ML IJ SOLN
40.0000 mg | Freq: Once | INTRAMUSCULAR | Status: AC
  Administered 2021-06-30: 40 mg via INTRAVENOUS
  Filled 2021-06-30: qty 4

## 2021-06-30 MED ORDER — SODIUM CHLORIDE 0.9% IV SOLUTION: Freq: Once | INTRAVENOUS | Status: DC

## 2021-06-30 MED ORDER — CEFAZOLIN SODIUM-DEXTROSE 2-4 GM/100ML-% IV SOLN
2.0000 g | Freq: Two times a day (BID) | INTRAVENOUS | Status: DC
  Administered 2021-07-01 – 2021-07-03 (×6): 2 g via INTRAVENOUS
  Filled 2021-06-30 (×8): qty 100

## 2021-06-30 MED ORDER — SODIUM CHLORIDE 0.9 % IV BOLUS
500.0000 mL | Freq: Once | INTRAVENOUS | Status: AC
  Administered 2021-06-30: 500 mL via INTRAVENOUS

## 2021-06-30 NOTE — Progress Notes (Signed)
   06/30/21 0839  Assess: MEWS Score  Temp 99 F (37.2 C)  BP 135/83  Pulse Rate (!) 105  ECG Heart Rate (!) 104  Resp (!) 29  SpO2 98 %  O2 Device Room Air  Assess: MEWS Score  MEWS Temp 0  MEWS Systolic 0  MEWS Pulse 1  MEWS RR 2  MEWS LOC 0  MEWS Score 3  MEWS Score Color Yellow  Assess: if the MEWS score is Yellow or Red  Were vital signs taken at a resting state? Yes  Focused Assessment No change from prior assessment  Early Detection of Sepsis Score *See Row Information* Low  MEWS guidelines implemented *See Row Information* No, previously yellow, continue vital signs every 4 hours  Treat  Pain Scale 0-10  Pain Score 0

## 2021-06-30 NOTE — Progress Notes (Signed)
PROGRESS NOTE  Kristy [name] VQM:[id number] DOB: 1946/05/03 DOA: 06/10/2021 PCP: Lujean Amel, MD   LOS: 19 days   Brief Narrative / Interim history: 75 year old female with history of EtOH use, admitted to the hospital 8/1 with frequent falls, generalized weakness and confusion.  She was found to be septic with MSSA bacteremia as well as having cervical epidural abscess/cervical discitis/osteomyelitis.  ID along with neurosurgery were consulted and she is status post I&D on 06/16/2021.  Hospital course complicated by persistent encephalopathy  Subjective / 24h Interval events: Alert, denies any cough/chest congestion, denies any shortness of breath.  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);  Cervical discitis/osteomyelitis with associated torticollis and cervical contract; meningitis ruled out UTI (POA);  - Neuro, ID, and Neurosurgery consulted -TTE: No vegetation/endocarditis ID initially concerned for possible systemic or central etiology given multiple sites of staph infection -S/P I/D on  06/16/21 - copious amounts Drainage involving longus colli with myositis, discitis, osteomyelitis, large epidural abscess s/p drainage cultures positive for MSSA - Nafcilln pharmacy dosed 8/07 - 06/30/21 FOLLOWED BY Cefazolin pharmacy dosed 8/22 - 08/11/2021 - Rifampin 600 mg BID to be continued throughout the treatment as she can tolerate for 8 weeks; End Dates: 8/21 for nafcillin; 10/02 for cefazolin   -Given underlying CKD 3B uncertain if patient would be an appropriate candidate for PICC line -8/17 added low-dose Skelaxin TID decreased pain and neck spasm as of 8/19 patient is now keeping head to the midline -Therapy sts pt in significant pain w/ any movement and this is affecting therapy sessions and sleep. Will cahnge from prn narcs to Oxy IR scheduled q 8 hrs -Repeat blood cultures today given fever overnight.  Chest x-ray with  worsening bibasilar infiltrates/edema, left greater than right.  She is getting antibiotics.  She is up 9 L, will give a dose of Lasix and stop IV fluids   Acute metabolic encephalopathy 2/2 sepsis physiology and persistent hypernatremia/associated profound dysphagia - Neurology consulted - bedside LP unable to be performed  - MRI remarkable for osteo/epidural abscess as above -SLP following and given altered mentation and overall weakness unable to allow oral intake at this time noting patient unable to swallow adequately -cortrack for feedings and free water/meds and as of 8/18 have consulted IR for PEG tube placement, will happen next week -Hypernatremia resolved   Epistaxis -Had epistaxis 8/17 but unable to achieve hemostasis with conservative measures (Gore-Tex packing) -ENT/Dr. Lucia Gaskins evaluated patient and cauterized bleeding vessel in right anterior nostril with silver nitrate, aspirin and heparin stopped. -No further bleeding as of this morning on 8/19.  Patient did have some mild bleeding when she was sat upright in the bed and she leaned forward somewhat on 8/18 but no further recurrence   Macrocytosis /anemia of critical illness as well as associated chronic kidney disease /acute blood loss anemia due to epistaxis -Received total of 2 packed red blood cells on 8/19 and 8/21   Thrombocytopenia  -Aspirin and heparin discontinued in context of epistaxis and likely until platelets fully recover to greater than 150,000 it is unlikely we will be able to resume anticoagulants for DVT prophylax   Hypernatremic/hyperchloremic, improving -Likely secondary to previous IV fluid boluses and concurrent IV medications  -Appears very dry noting continues to have elevated BUN and abnormal creatinine, low EGFR and sodium remains elevated at 159 (ECHO 8/9 also consistent with volume depletion noting CVP 3)-8/19 sodium has decreased to 149 -Hyponatremia resolved.  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  Monitor while on Lasix   UTI, POA, resolved:  - Covered by above abx - treatment completed   Ambulatory dysfunction with fall at home, severe weakness - Secondary to above - PT/OT currently recommending SNF rehabilitation once acute infectious process under better control    Alcoholic hepatitis  - Based on AST predominance of LFT elevations. Hepatitis panel nonreactive.  Family confirms that patient drank 2 beers per day prior to admission - No indication for prednisolone at this time. Continue trending. - AST/ALT downtrending - bilirubin increased marginally    Alcohol abuse:  - Per family - 2 beers per day of late, etoh level negative in ED.  - Cessation counseling to be repeated once pt more clear-headed. - Empirically supplementing thiamine, folate, MVM.   Hypokalemia -8/16 potassium was low at 3.1 and after repletion has increased to 3.5 -Have added low-dose potassium to maintenance fluids -Continue potassium per tube   Severe protein caloric malnutrition  -Continue tube feeds and free water flushes via cortrack - Albumin continues to downtrend despite increased tube feeds we will give 4 doses of albumin as above Nutrition Problem: Moderate Malnutrition Etiology: chronic illness Signs/Symptoms: moderate muscle depletion, moderate fat depletion, mild muscle depletion, mild fat depletion Interventions: Prostat, Tube feeding   HTN/grade 1 diastolic dysfunction: -Hold ARB given AKI -Continue Norvasc -Echo this admission with an EF of 65 to 70% with right 1 diastolic physiology, also noted with mild MR and AR   HLD: - Hold statin for now; AST has decreased to 51 and ALT normal at 38 total bili slightly elevated at 1.3    Scheduled Meds:  sodium chloride   Intravenous Once   allopurinol  300 mg Per Tube Daily   amLODipine  10 mg Per Tube  Daily   Chlorhexidine Gluconate Cloth  6 each Topical Daily   diclofenac Sodium  4 g Topical QID   folic acid  1 mg Per Tube Daily   free water  200 mL Per Tube Q4H   metaxalone  400 mg Per Tube TID   multivitamin  15 mL Per Tube Daily   oxyCODONE  2.5 mg Oral Q8H   rifampin  300 mg Oral Q12H   thiamine injection  100 mg Intravenous Daily   Continuous Infusions:  [START ON 07/01/2021]  ceFAZolin (ANCEF) IV     dextrose 5 % and 0.45 % NaCl with KCl 20 mEq/L 100 mL/hr at 06/30/21 1026   dextrose     feeding supplement (OSMOLITE 1.2 CAL) 1,000 mL (06/30/21 1026)   Nafcillin 12g infusion in dextrose 5% 500 mL 20.8 mL/hr at 06/29/21 2123   PRN Meds:.acetaminophen (TYLENOL) oral liquid 160 mg/5 mL, antiseptic oral rinse, melatonin, naLOXone (NARCAN)  injection, ondansetron (ZOFRAN) IV, polyethylene glycol  Diet Orders (From admission, onward)     Start     Ordered   06/21/21 1451  Diet NPO time specified  Diet effective now        06/21/21 1450            DVT prophylaxis: Place and maintain sequential compression device Start: 06/27/21 0526 SCDs Start: 06/11/21 0509     Code Status: Full Code  Family Communication: No family at bedside  Status is: Inpatient  Remains inpatient appropriate because:Unsafe d/c plan  Dispo: The patient is from:  Home              Anticipated d/c is to: SNF              Patient currently is not medically stable to d/c.   Difficult to place patient No  Level of care: Progressive  Consultants:  ID Neurosurgery IR  Procedures:  Cervical discectomy and fusion 8/7  Antimicrobials: Acyclovir on 8/5 and 8/6 Ampicillin 8/5 through 8/8 Ceftriaxone 8/5 through 8/9 Vancomycin 8/6 through 8/8 Nafcillin 8/9 >> Rifampin 8/12 >>    Objective: Vitals:   06/30/21 0557 06/30/21 0800 06/30/21 0839 06/30/21 1138  BP: (!) 109/54 124/72 135/83 116/64  Pulse: (!) 106 (!) 104 (!) 105 (!) 115  Resp: (!) 26 (!) 24 (!) 29 (!) 22  Temp: 98.1 F (36.7  C) 100.1 F (37.8 C) 99 F (37.2 C) 98.9 F (37.2 C)  TempSrc: Axillary Axillary Oral Oral  SpO2: 98% 98% 98% 96%  Weight:      Height:        Intake/Output Summary (Last 24 hours) at 06/30/2021 1313 Last data filed at 06/30/2021 1016 Gross per 24 hour  Intake 3943.25 ml  Output 650 ml  Net 3293.25 ml    Filed Weights   06/24/21 0400 06/25/21 0336 06/28/21 0500  Weight: 78.6 kg 78.8 kg 85.7 kg    Examination:  Constitutional: No distress Eyes: No scleral icterus ENMT:mmm.  Dried blood bilateral nostrils Neck: normal, supple Respiratory: Diminished at the bases but overall clear without wheezing Cardiovascular: Regular rate and rhythm, no murmurs, no edema Abdomen: Soft, NT, ND, bowel sounds positive Musculoskeletal: no clubbing / cyanosis.  Skin: No rashes seen Neurologic: Grossly nonfocal   Data Reviewed: I have independently reviewed following labs and imaging studies  CBC: Recent Labs  Lab 06/24/21 1042 06/26/21 0753 06/27/21 0543 06/27/21 1336 06/27/21 1932 06/28/21 0639 06/28/21 1758 06/30/21 0343  WBC 9.9 9.7 7.6  --   --  7.4  --  7.1  HGB 9.8* 7.9* 8.0* 6.9* 7.1* 6.7* 8.2* 6.8*  HCT 30.7* 25.5* 26.1* 21.6* 22.4* 21.4* 25.0* 21.9*  MCV 102.7* 104.1* 104.0*  --   --  103.9*  --  97.8  PLT 105* 113* 106*  --   --  109*  --  114*    Basic Metabolic Panel: Recent Labs  Lab 06/26/21 0753 06/27/21 0214 06/28/21 0639 06/29/21 0140 06/30/21 0343  NA 159* 152* 149* 141 136  K 3.5 3.3* 3.4* 3.4* 3.8  CL 125* 120* 117* 111 108  CO2 _0 21*  GLUCOSE 150* 115* 171* 186* 170*  BUN 77* 67* 48* 42* 36*  CREATININE 1.73* 1.47* 1.58* 1.56* 1.58*  CALCIUM 8.3* 8.0* 8.0* 7.8* 7.4*    Liver Function Tests: Recent Labs  Lab 06/28/21 0639  ALBUMIN 1.2*    Coagulation Profile: Recent Labs  Lab 06/28/21 0639  INR 1.4*    HbA1C: No results for input(s): HGBA1C in the last 72 hours. CBG: Recent Labs  Lab 06/29/21 2028 06/29/21 2351  06/30/21 0347 06/30/21 0804 06/30/21 1147  GLUCAP 154* 166* 162* 121* 126*     Recent Results (from the past 240 hour(s))  Culture, blood (Routine X 2) w Reflex to ID Panel     Status: None (Preliminary result)   Collection Time: 06/30/21  8:40 AM   Specimen: BLOOD  Result Value Ref Range Status   Specimen Description BLOOD BLOOD RIGHT HAND  Final   Special Requests AEROBIC BOTTLE ONLY  Blood Culture adequate volume  Final   Culture   Final    NO GROWTH < 12 HOURS Performed at [facility], [place] [address] [place], [place] [address]    Report Status PENDING  Incomplete  Culture, blood (Routine X 2) w Reflex to ID Panel     Status: None (Preliminary result)   Collection Time: 06/30/21  8:40 AM   Specimen: BLOOD  Result Value Ref Range Status   Specimen Description BLOOD RIGHT ANTECUBITAL  Final   Special Requests   Final    BOTTLES DRAWN AEROBIC AND ANAEROBIC Blood Culture results may not be optimal due to an inadequate volume of blood received in culture bottles   Culture   Final    NO GROWTH < 12 HOURS Performed at [facility], [place] [address], [place], [place] [address]    Report Status PENDING  Incomplete     Radiology Studies: DG CHEST PORT 1 VIEW  Result Date: 06/30/2021 CLINICAL DATA:  Chest xray ordered because of fever. EXAM: PORTABLE CHEST - 1 VIEW COMPARISON:  06/14/2021 FINDINGS: Increase in ill-defined interstitial and airspace opacities in the lung bases, left greater than right. Heart size and mediastinal contours are within normal limits. Blunting of left lateral costophrenic angle suggesting small effusion. No pneumothorax. Surgical fixation hardware partially seen. Feeding tube extends at least as far as the stomach, tip not seen. IMPRESSION: Worsening bibasilar infiltrates or edema, left greater than right. Possible small left effusion. Electronically Signed   By: Lucrezia Europe M.D.   On: 06/30/2021 12:16    Marzetta Board, MD, PhD [facility]  Between 7 am - 7 pm I am available, please contact me via Amion (for emergencies) or Securechat (non urgent messages)  Between 7 pm - 7 am I am not available, please contact night coverage MD/APP via Amion

## 2021-06-30 NOTE — Progress Notes (Addendum)
[facility] night shift PCU coverage note.  The patient is febrile, tachycardic and tachypneic.  She was given acetaminophen by the nursing staff.  I have added a normal saline 500 mL bolus.  We will continue to monitor.  Sanda Klein, MD.  Addendum [phone]  The nursing staff reported that the lab just reported a hemoglobin panic level of 6.8 g/dL.  Full CBC is still pending.  She was transfused on 06/28/2021 due to 6.7 hemoglobin level following an episode of epistaxis.  1 unit PRBC transfusion has been ordered.

## 2021-06-30 NOTE — Plan of Care (Signed)
  Problem: Clinical Measurements: Goal: Respiratory complications will improve Outcome: Progressing   Problem: Clinical Measurements: Goal: Cardiovascular complication will be avoided Outcome: Progressing   Problem: Nutrition: Goal: Adequate nutrition will be maintained Outcome: Progressing   

## 2021-06-30 NOTE — Progress Notes (Signed)
Rec;d call from lab stating that pt had critical hgb of 6.8. Rec'd order to infuse 1 uint of blood. Pt currently receiving transfusion with no s/s of adverse reaction. Vs remain stable will continue to monitor and tx as indicated. Oral care provided with small clots noted in canister after suctioning mouth.

## 2021-07-01 LAB — GLUCOSE, CAPILLARY
Glucose-Capillary: 121 mg/dL — ABNORMAL HIGH (ref 70–99)
Glucose-Capillary: 96 mg/dL (ref 70–99)
Glucose-Capillary: 124 mg/dL — ABNORMAL HIGH (ref 70–99)
Glucose-Capillary: 112 mg/dL — ABNORMAL HIGH (ref 70–99)
Glucose-Capillary: 108 mg/dL — ABNORMAL HIGH (ref 70–99)
Glucose-Capillary: 116 mg/dL — ABNORMAL HIGH (ref 70–99)

## 2021-07-01 LAB — BASIC METABOLIC PANEL
Anion gap: 9 (ref 5–15)
BUN: 38 mg/dL — ABNORMAL HIGH (ref 8–23)
CO2: 21 mmol/L — ABNORMAL LOW (ref 22–32)
Calcium: 7.6 mg/dL — ABNORMAL LOW (ref 8.9–10.3)
Chloride: 106 mmol/L (ref 98–111)
Creatinine, Ser: 1.53 mg/dL — ABNORMAL HIGH (ref 0.44–1.00)
GFR, Estimated: 35 mL/min — ABNORMAL LOW (ref >60)
Glucose, Bld: 121 mg/dL — ABNORMAL HIGH (ref 70–99)
Potassium: 4.2 mmol/L (ref 3.5–5.1)
Sodium: 136 mmol/L (ref 135–145)

## 2021-07-01 LAB — CBC
HCT: 26.7 % — ABNORMAL LOW (ref 36.0–46.0)
Hemoglobin: 8.7 g/dL — ABNORMAL LOW (ref 12.0–15.0)
MCH: 30.7 pg (ref 26.0–34.0)
MCHC: 32.6 g/dL (ref 30.0–36.0)
MCV: 94.3 fL (ref 80.0–100.0)
Platelets: 117 10*3/uL — ABNORMAL LOW (ref 150–400)
RBC: 2.83 MIL/uL — ABNORMAL LOW (ref 3.87–5.11)
RDW: 20 % — ABNORMAL HIGH (ref 11.5–15.5)
WBC: 7.3 10*3/uL (ref 4.0–10.5)
nRBC: 0 % (ref 0.0–0.2)

## 2021-07-01 LAB — TYPE AND SCREEN
ABO/RH(D): B NEG
Antibody Screen: NEGATIVE
Unit division: 0
Unit division: 0
Weak D: POSITIVE

## 2021-07-01 LAB — BPAM RBC
Blood Product Expiration Date: 202208282359
Blood Product Expiration Date: 202208292359
ISSUE DATE / TIME: 202208191048
ISSUE DATE / TIME: 202208210531
Unit Type and Rh: 1700
Unit Type and Rh: 1700

## 2021-07-01 MED ORDER — POTASSIUM CHLORIDE 20 MEQ PO PACK
20.0000 meq | PACK | Freq: Every day | ORAL | Status: DC
  Administered 2021-07-01 – 2021-07-05 (×5): 20 meq
  Filled 2021-07-01 (×6): qty 1

## 2021-07-01 MED ORDER — OXYCODONE HCL 5 MG PO TABS
2.5000 mg | ORAL_TABLET | Freq: Three times a day (TID) | ORAL | Status: DC
Start: 2021-07-01 — End: 2021-07-03
  Administered 2021-07-01 – 2021-07-03 (×5): 2.5 mg
  Filled 2021-07-01 (×5): qty 1

## 2021-07-01 MED ORDER — FREE WATER
125.0000 mL | Status: DC
  Administered 2021-07-01 – 2021-07-11 (×53): 125 mL

## 2021-07-01 NOTE — Plan of Care (Signed)
  Problem: Pain Managment: Goal: General experience of comfort will improve Outcome: Progressing   Problem: Safety: Goal: Ability to remain free from injury will improve Outcome: Progressing   

## 2021-07-01 NOTE — Progress Notes (Signed)
[facility] PROGRESS NOTE  Kristy [name]:[id number] DOB: Aug 31, 1946 DOA: 06/10/2021 PCP: Darrow Bussing, MD  Status: Remains inpatient appropriate because:Persistent severe electrolyte disturbances, Altered mental status, Unsafe d/c plan, IV treatments appropriate due to intensity of illness or inability to take PO, and Inpatient level of care appropriate due to severity of illness  Dispo: The patient is from: Home              Anticipated d/c is to: SNF              Patient currently is not medically stable to d/c.   Difficult to place patient No    Level of care: Progressive  Code Status: Full Family Communication:  DVT prophylaxis: Changed to SQ heparin given estimated GFR 29 COVID vaccination status: Pfizer 02/06/2020 and 02/28/2020; will need booster once has improved from a neurological standpoint  Foley catheter: Yes POA: No Date inserted: 06/16/21 Still needed?/Rationale: Yes-accurate I's/O while hydrating for AKI and hypernatremia  HPI: 75 y.o. female with a history of alcohol use who presented to the ED for frequent falls, generalized weakness, and confusion. She was found to be afebrile, hypertensive, and without hypoxia. Also had hypokalemia (K 2.6), SCr 1.88 (above baseline ~1.5), LFT elevation (AST 576, ALT 305). Ammonia normal. EtOH, salicylate, tylenol levels negative. CT head and cervical spine were reassuring, though the patient was unable to rise to seated position from laying prompting consultation for admission. The patient was given potassium supplementation, started on CIWA protocol, and admitted with note made of continued AMS. She has remained weak and confused, though on 8/5 became less interactive, retaining urine, WBC noted to be rising. Blood and urine cultures ordered, ceftriaxone given with bacteriuria and urinary retention. MR brain has not been done yet. Neurology consulted with worsening mental status and nuchal rigidity. LP under fluoro requested,  empirically treating meningitis/encephalitis.   Subjective: Awake.  Head once again turned to the right.  Patient attempted to turn head but this elicited a significant amount of pain with movement.  Patient otherwise denies ongoing pain.  Objective: Vitals:   06/30/21 2300 07/01/21 0330  BP: (!) 104/55 (!) 105/58  Pulse: 94 88  Resp: 20 18  Temp: 98 F (36.7 C) 98 F (36.7 C)  SpO2: 99% 98%    Intake/Output Summary (Last 24 hours) at 07/01/2021 0731 Last data filed at 07/01/2021 0300 Gross per 24 hour  Intake 2851.68 ml  Output 3400 ml  Net -548.32 ml   Filed Weights   06/24/21 0400 06/25/21 0336 06/28/21 0500  Weight: 78.6 kg 78.8 kg 85.7 kg    Exam: Constitutional: NAD, awake but still not alert ENT: No further nasal bleed Pulmonary: Lungs are clear to auscultation.Room air saturations 99-100%. Cardiovascular: Sinus rhythm, normotensive.  Focal edema of upper extremities. 2+ pedal pulses.  Abdomen: Soft and nontender with normoactive bowel sounds.  Cortrak in place for enteral feedings.  LBM 8/21 Genitourinary: Foley catheter in place with amber-colored urine  Musculoskeletal: Still has preferential positioning of head and neck to the right.  Still has contracture has pain with AROM but no resting pain Skin: Right anterior neck incision approximated with expected inflammatory induration directly beneath scar without any associated erythema Neurologic: CN 2-12 grossly intact. Sensation intact, Strength 2/5 x all 4 extremities.  Psychiatric: Awake and oriented x 3.  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; meningitis  ruled out UTI (POA);  - Neuro, ID, and Neurosurgery consulted -TTE: No vegetation/endocarditis ID initially concerned for possible systemic or central etiology given multiple  sites of staph infection -S/P I/D on  06/16/21 - copious amounts -Drainage involving longus colli with myositis, discitis, osteomyelitis, large epidural abscess s/p drainage cultures positive for MSSA -Completed 2 weeks of nafcillin and is now on Ancef until 10/2 -Continue rifampin for duration of treatment -Continue low-dose Skelaxin TID for neck spasm and contracture  -Continue Oxy IR scheduled q 8 hrs -Low-grade fevers over the weekend (8/21-8/22) without associated neck pain.  No leukocytosis chest x-ray showed either infiltrates versus edema thus treated as edema since she was 9 L positive.  No changes made to antibiotic and plan is to continue to follow.  Acute metabolic encephalopathy 2/2 sepsis physiology and persistent hypernatremia/associated profound dysphagia - Neurology consulted - bedside LP unable to be performed  - MRI remarkable for osteo/epidural abscess as above -SLP following and given altered mentation and overall weakness unable to allow oral intake at this time noting patient unable to swallow adequately -cortrack for feedings and free water/meds and as of 8/18 have consulted IR for PEG tube placement  Epistaxis -Had epistaxis overnight but unable to achieve hemostasis with conservative measures (Gore-Tex packing) -ENT/Dr. Ezzard Standing evaluated patient and cauterized bleeding vessel in right anterior nostril with silver nitrate, aspirin and heparin stopped. -No further bleeding   Macrocytosis /anemia of critical illness as well as associated chronic kidney disease -8/17 hemoglobin 7.9 (?  Hemodilution)-on 8/18 even after recent epistaxis hemoglobin 8.0 -Hgb down to 6.7 after recent epistaxis she was given 1 unit of PRBCs with improvement of hemoglobin to 8.2.  Once again hemoglobin down to 6.8 on 8/21 and was given an additional unit of PRBCs as of 8/22 hemoglobin stable at 8.7  Thrombocytopenia  -platelets 140,000 on 8/9  -8/17 platelets 105,000 and on 8/22 platelets up to  117,000 -Aspirin and heparin discontinued in context of epistaxis and it is unlikely we will be able to resume anticoagulants for DVT prophylaxis   Hypernatremic/hyperchloremic, improving -Likely secondary to previous IV fluid boluses and concurrent IV medications  -Sodium has normalized after administration of D5 1/2NS. Due to volume overload symptoms was discontinued on 8/22 -8/22 Decrease free water to 125 cc over 4 hours since sodium has been ranging between 135 and 136 -Patient hypoalbuminemic and was given 25% albumin x4 runs on 8/17  AKI on stage IIIb CKD, stabilizing:  -Baseline renal function: BUN 24 and creatinine 1.5 with he GFR slightly greater than 30 -8/17: BUN 77 and creatinine 1.73 therefore initiated IV fluids and increased free water with resolution of hypernatremia and improvement of renal function to baseline..  Because of volume overload over this past weekend maintenance fluids discontinued. As of 8/19 BUN 38 and creatinine 1.53 after given IV Lasix for volume overload - Monitor renal function given requirement for vancomycin,   UTI, POA, resolved:  - Covered by above abx - treatment completed   Ambulatory dysfunction with fall at home, severe weakness:  - Secondary to above - PT/OT currently recommending SNF rehabilitation once acute infectious process under better control -    Alcoholic hepatitis:  - Based on AST predominance of LFT elevations. Hepatitis panel nonreactive.  Family confirms that patient drank 2 beers per day prior to admission - No indication for prednisolone at this time. Continue trending. - AST/ALT downtrending - bilirubin increased marginally    Alcohol abuse:  - Per family - 2  beers per day of late, etoh level negative in ED.  - Cessation counseling to be repeated once pt more clear-headed. - Empirically supplementing thiamine, folate, MVM.   Hypokalemia: -8/16 potassium was low at 3.1 and after repletion has increased to 3.5 -8/22  potassium 4.2   Severe protein caloric malnutrition  -Continue tube feeds and free water flushes via cortrack -Repeat albumin level on 8/19 was 1.2-we will repeat in a.m. -Mental status continues to influence patient's ability to eat safely.  After discussion with family plan is to place gastrostomy tube in order to remove Cortrak-this will allow for continued enteral feedings until patient able to sustain adequate oral intake Nutrition Problem: Moderate Malnutrition Etiology: chronic illness Signs/Symptoms: moderate muscle depletion, moderate fat depletion, mild muscle depletion, mild fat depletion Interventions: Prostat, Tube feeding  HTN/grade 1 diastolic dysfunction: -Hold ARB given AKI -Continue Norvasc -Echo this admission with an EF of 65 to 70% with right 1 diastolic physiology, also noted with mild MR and AR -Was +9 L after IV fluid rehydration therefore was given one-time dose of IV Lasix over the weekend   HLD: - Hold statin for now; AST has decreased to 51 and ALT normal at 38 total bili slightly elevated at 1.3      Data Reviewed: Basic Metabolic Panel: Recent Labs  Lab 06/27/21 0214 06/28/21 0639 06/29/21 0140 06/30/21 0343 07/01/21 0431  NA 152* 149* 141 136 136  K 3.3* 3.4* 3.4* 3.8 4.2  CL 120* 117* 111 108 106  CO2 25 25 22  21* 21*  GLUCOSE 115* 171* 186* 170* 121*  BUN 67* 48* 42* 36* 38*  CREATININE 1.47* 1.58* 1.56* 1.58* 1.53*  CALCIUM 8.0* 8.0* 7.8* 7.4* 7.6*   Liver Function Tests: Recent Labs  Lab 06/28/21 0639  ALBUMIN 1.2*   No results for input(s): LIPASE, AMYLASE in the last 168 hours. No results for input(s): AMMONIA in the last 168 hours. CBC: Recent Labs  Lab 06/26/21 0753 06/27/21 0543 06/27/21 1336 06/27/21 1932 06/28/21 0639 06/28/21 1758 06/30/21 0343 07/01/21 0431  WBC 9.7 7.6  --   --  7.4  --  7.1 7.3  HGB 7.9* 8.0*   < > 7.1* 6.7* 8.2* 6.8* 8.7*  HCT 25.5* 26.1*   < > 22.4* 21.4* 25.0* 21.9* 26.7*  MCV 104.1*  104.0*  --   --  103.9*  --  97.8 94.3  PLT 113* 106*  --   --  109*  --  114* 117*   < > = values in this interval not displayed.   Cardiac Enzymes: No results for input(s): CKTOTAL, CKMB, CKMBINDEX, TROPONINI in the last 168 hours. BNP (last 3 results) No results for input(s): BNP in the last 8760 hours.  ProBNP (last 3 results) No results for input(s): PROBNP in the last 8760 hours.  CBG: Recent Labs  Lab 06/30/21 1147 06/30/21 1549 06/30/21 2045 06/30/21 2340 07/01/21 0323  GLUCAP 126* 121* 134* 113* 121*    Recent Results (from the past 240 hour(s))  Culture, blood (Routine X 2) w Reflex to ID Panel     Status: None (Preliminary result)   Collection Time: 06/30/21  8:40 AM   Specimen: BLOOD  Result Value Ref Range Status   Specimen Description BLOOD BLOOD RIGHT HAND  Final   Special Requests AEROBIC BOTTLE ONLY Blood Culture adequate volume  Final   Culture   Final    NO GROWTH < 12 HOURS Performed at [facility], [address],  [place], [place] [address]    Report Status PENDING  Incomplete  Culture, blood (Routine X 2) w Reflex to ID Panel     Status: None (Preliminary result)   Collection Time: 06/30/21  8:40 AM   Specimen: BLOOD  Result Value Ref Range Status   Specimen Description BLOOD RIGHT ANTECUBITAL  Final   Special Requests   Final    BOTTLES DRAWN AEROBIC AND ANAEROBIC Blood Culture results may not be optimal due to an inadequate volume of blood received in culture bottles   Culture   Final    NO GROWTH < 12 HOURS Performed at [facility], [address] [place], [place] [address]    Report Status PENDING  Incomplete      Studies: DG CHEST PORT 1 VIEW  Result Date: 06/30/2021 CLINICAL DATA:  Chest xray ordered because of fever. EXAM: PORTABLE CHEST - 1 VIEW COMPARISON:  06/14/2021 FINDINGS: Increase in ill-defined interstitial and airspace opacities in the lung bases, left greater than right. Heart size and mediastinal contours are  within normal limits. Blunting of left lateral costophrenic angle suggesting small effusion. No pneumothorax. Surgical fixation hardware partially seen. Feeding tube extends at least as far as the stomach, tip not seen. IMPRESSION: Worsening bibasilar infiltrates or edema, left greater than right. Possible small left effusion. Electronically Signed   By: Corlis Leak  Hassell M.D.   On: 06/30/2021 12:16    Scheduled Meds:  sodium chloride   Intravenous Once   allopurinol  300 mg Per Tube Daily   amLODipine  10 mg Per Tube Daily   Chlorhexidine Gluconate Cloth  6 each Topical Daily   diclofenac Sodium  4 g Topical QID   folic acid  1 mg Per Tube Daily   free water  200 mL Per Tube Q4H   metaxalone  400 mg Per Tube TID   multivitamin  15 mL Per Tube Daily   oxyCODONE  2.5 mg Oral Q8H   rifampin  300 mg Oral Q12H   thiamine injection  100 mg Intravenous Daily   Continuous Infusions:   ceFAZolin (ANCEF) IV     dextrose     feeding supplement (OSMOLITE 1.2 CAL) 1,000 mL (06/30/21 1026)    Principal Problem:   Abscess in epidural space of cervical spine Active Problems:   Fall   Alcoholic hepatitis   Altered mental status   Acute delirium   Sepsis (HCC)   Malnutrition of moderate degree   Consultants: Neurology Psychiatry Infectious disease Neurosurgery  Procedures: Echocardiogram Cervical discectomy and fusion on 06/16/2021  Antibiotics: Acyclovir on 8/5 and 8/6 Ampicillin 8/5 through 8/8 Ceftriaxone 8/5 through 8/9 Vancomycin 8/6 through 8/8 Nafcillin 8/9 >> Rifampin 8/12 >>   Time spent: 45 minutes    Junious SilkAllison Amzie Sillas ANP  [facility] 7 am - 330 pm/M-F for direct patient care and secure chat Please refer to Amion for contact info 20  days

## 2021-07-01 NOTE — TOC Progression Note (Addendum)
Transition of Care [facility]) - Progression Note    Patient Details  Name: Kristy [name] MRN: [id number] Date of Birth: [birth year]  [facility] CM/SW Contact  [name], RN Phone Number: 07/01/2021, 1:37 PM  Clinical Narrative:    Case management spoke with Junious Silk, NP and the patient is scheduled for a PEG placement tomorrow.  I spoke with the daughter, [name] on the phone and she is visiting [facility] this evening and will be me a decision for placement by tomorrow, 07/02/2021.  CM and MSW will continue to follow the patient for SNF placement.  07/01/2021 1407 - I spoke with the patient's daughter and the patient's daughter is considering [facility] at this time.  Clinicals were also sent in the hub for [facility] and [facility] at this time per possible bed offer.  The patient's daughter was updated and she states that she was agreeable to the patient receiving a booster for COVID.  07/01/2021 1448 - [name], CM at [facility] has accepted the patient for admission.  I called the daughter and she accepted the offer at [facility] for admission.   Expected Discharge Plan: Skilled Nursing Facility Barriers to Discharge: Continued Medical Work up  Expected Discharge Plan and Services Expected Discharge Plan: Skilled Nursing Facility In-house Referral: Clinical Social Work Discharge Planning Services: CM Consult Post Acute Care Choice: Skilled Nursing Facility Living arrangements for the past 2 months: Single Family Home                                       Social Determinants of Health (SDOH) Interventions    Readmission Risk Interventions No flowsheet data found.

## 2021-07-01 NOTE — Progress Notes (Signed)
Physical Therapy Treatment Patient Details Name: Kristy [name] MRN: [id number] DOB: Nov 11, 1945 Today's Date: 07/01/2021    History of Present Illness Pt is a 75yo female presenting to [facility] ED on 8/1 after a fall on 7/29. Pt also fell on 8/2 while in the ED. Pt found to be hypokalemic upon admission. 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 Pt is now s/p drainage of abscess and C5-6 ACDF on 06/16/21. Also noted R SI septic arthritis.  PMH: HTN, polycthemia, alcohol use.    PT Comments    Pt demonstrating mobility progression this day, reaching EOB x3 minutes with min-max posterior truncal support. Pt overall requiring max-total assist for bed mobility to reach EOB, with periods of vocalizing in pain, but shortlived. Pt expresses she has a goal to walk again, SNF remains appropriate.   Of note, NG tube feedings paused during session and restarted when pt returned to supine, HOB elevation 32 deg.     Follow Up Recommendations  SNF;Supervision/Assistance - 24 hour     Equipment Recommendations  Other (comment) (TBD)    Recommendations for Other Services       Precautions / Restrictions Precautions Precautions: Fall;Cervical Precaution Comments: NG Restrictions Weight Bearing Restrictions: No Other Position/Activity Restrictions: no brace needed    Mobility  Bed Mobility Overal bed mobility: Needs Assistance Bed Mobility: Rolling;Sidelying to Sit;Sit to Supine Rolling: Total assist Sidelying to sit: Total assist   Sit to supine: Total assist   General bed mobility comments: total assist for trunk and LE management, scooting to/from EOB, and boost up in bed with use of bed pads upon return to supine.    Transfers                 General transfer comment: NT  Ambulation/Gait                 Stairs             Wheelchair Mobility    Modified Rankin (Stroke Patients Only)        Balance Overall balance assessment: Needs assistance Sitting-balance support: Bilateral upper extremity supported;Feet supported Sitting balance-Leahy Scale: Poor Sitting balance - Comments: sat EOB x3 minutes initially with max assist transitioning to periods of min guard only Postural control: Posterior lean     Standing balance comment: nt                            Cognition Arousal/Alertness: Awake/alert Behavior During Therapy: Flat affect Overall Cognitive Status: Impaired/Different from baseline Area of Impairment: Orientation;Attention;Memory;Following commands;Safety/judgement;Awareness;Problem solving                 Orientation Level: Disoriented to;Place;Time;Situation Current Attention Level: Focused Memory: Decreased recall of precautions;Decreased short-term memory Following Commands: Follows one step commands consistently Safety/Judgement: Decreased awareness of deficits;Decreased awareness of safety Awareness: Intellectual Problem Solving: Slow processing;Decreased initiation;Difficulty sequencing;Requires verbal cues;Requires tactile cues General Comments: Pt initially states she is at the hospital in [place], but then starts calling for brother during treatment as if she is at home. Pt states year is 1972, repeatedly asks for back scratch even after PT assisting pt in scratching back with towel once sitting EOB.      Exercises General Exercises - Lower Extremity Long Arc Quad: AROM;Both;10 reps;Seated Other Exercises Other Exercises: gentle cervical AAROM in L rotation, unable to reach midline positioning. PT placed towel roll on R side  of pt occiput to encourage more midline positioning.    General Comments General comments (skin integrity, edema, etc.): min nosebleed at R nostril, RN notified      Pertinent Vitals/Pain Pain Assessment: Faces Faces Pain Scale: Hurts whole lot Pain Location: generalized Pain Descriptors /  Indicators: Grimacing Pain Intervention(s): Limited activity within patient's tolerance;Monitored during session;Repositioned    Home Living                      Prior Function            PT Goals (current goals can now be found in the care plan section) Acute Rehab PT Goals Patient Stated Goal: per family for pt to get better PT Goal Formulation: With patient Time For Goal Achievement: 07/08/21 Potential to Achieve Goals: Fair Progress towards PT goals: Progressing toward goals    Frequency    Min 2X/week      PT Plan Current plan remains appropriate    Co-evaluation              AM-PAC PT "6 Clicks" Mobility   Outcome Measure  Help needed turning from your back to your side while in a flat bed without using bedrails?: Total Help needed moving from lying on your back to sitting on the side of a flat bed without using bedrails?: Total Help needed moving to and from a bed to a chair (including a wheelchair)?: Total Help needed standing up from a chair using your arms (e.g., wheelchair or bedside chair)?: Total Help needed to walk in hospital room?: Total Help needed climbing 3-5 steps with a railing? : Total 6 Click Score: 6    End of Session   Activity Tolerance: Patient limited by pain;Patient limited by fatigue Patient left: in bed;with call bell/phone within reach;with bed alarm set Nurse Communication: Mobility status PT Visit Diagnosis: History of falling (Z91.81);Difficulty in walking, not elsewhere classified (R26.2);Muscle weakness (generalized) (M62.81);Other abnormalities of gait and mobility (R26.89)     Time: 1601-0932 PT Time Calculation (min) (ACUTE ONLY): 19 min  Charges:  $Therapeutic Activity: 8-22 mins                     Marye Round, PT DPT Acute Rehabilitation Services Pager [phone]  Office [phone]    Kristy [name] 07/01/2021, 4:40 PM

## 2021-07-01 NOTE — Progress Notes (Signed)
SLP Cancellation Note  Patient Details Name: Kristy [name] MRN: [id number] DOB: April 18, 1946   Cancelled treatment:       Reason Eval/Treat Not Completed: Patient declined treatment and PO trials this date, stating she was about to take a nap. RN notified. Will continue efforts.    Avie Echevaria, MA, CCC-SLP Acute Rehabilitation Services Office Number: [phone]  Paulette Blanch 07/01/2021, 1:35 PM

## 2021-07-02 ENCOUNTER — Inpatient Hospital Stay (HOSPITAL_COMMUNITY): Payer: Medicare Other

## 2021-07-02 HISTORY — PX: IR GASTROSTOMY TUBE MOD SED: IMG625

## 2021-07-02 LAB — CBC
HCT: 27.9 % — ABNORMAL LOW (ref 36.0–46.0)
Hemoglobin: 8.9 g/dL — ABNORMAL LOW (ref 12.0–15.0)
MCH: 30.2 pg (ref 26.0–34.0)
MCHC: 31.9 g/dL (ref 30.0–36.0)
MCV: 94.6 fL (ref 80.0–100.0)
Platelets: 146 10*3/uL — ABNORMAL LOW (ref 150–400)
RBC: 2.95 MIL/uL — ABNORMAL LOW (ref 3.87–5.11)
RDW: 19.3 % — ABNORMAL HIGH (ref 11.5–15.5)
WBC: 7.3 10*3/uL (ref 4.0–10.5)
nRBC: 0 % (ref 0.0–0.2)

## 2021-07-02 LAB — COMPREHENSIVE METABOLIC PANEL
ALT: 13 U/L (ref 0–44)
AST: 26 U/L (ref 15–41)
Albumin: 1 g/dL — ABNORMAL LOW (ref 3.5–5.0)
Alkaline Phosphatase: 132 U/L — ABNORMAL HIGH (ref 38–126)
Anion gap: 9 (ref 5–15)
BUN: 34 mg/dL — ABNORMAL HIGH (ref 8–23)
CO2: 22 mmol/L (ref 22–32)
Calcium: 8 mg/dL — ABNORMAL LOW (ref 8.9–10.3)
Chloride: 106 mmol/L (ref 98–111)
Creatinine, Ser: 1.56 mg/dL — ABNORMAL HIGH (ref 0.44–1.00)
GFR, Estimated: 34 mL/min — ABNORMAL LOW (ref >60)
Glucose, Bld: 74 mg/dL (ref 70–99)
Potassium: 3.6 mmol/L (ref 3.5–5.1)
Sodium: 137 mmol/L (ref 135–145)
Total Bilirubin: 1.7 mg/dL — ABNORMAL HIGH (ref 0.3–1.2)
Total Protein: 5.5 g/dL — ABNORMAL LOW (ref 6.5–8.1)

## 2021-07-02 LAB — GLUCOSE, CAPILLARY
Glucose-Capillary: 68 mg/dL — ABNORMAL LOW (ref 70–99)
Glucose-Capillary: 70 mg/dL (ref 70–99)
Glucose-Capillary: 72 mg/dL (ref 70–99)
Glucose-Capillary: 79 mg/dL (ref 70–99)
Glucose-Capillary: 80 mg/dL (ref 70–99)
Glucose-Capillary: 87 mg/dL (ref 70–99)

## 2021-07-02 MED ORDER — GLUCAGON HCL RDNA (DIAGNOSTIC) 1 MG IJ SOLR
INTRAMUSCULAR | Status: AC
  Filled 2021-07-02: qty 1

## 2021-07-02 MED ORDER — LIDOCAINE HCL 1 % IJ SOLN
INTRAMUSCULAR | Status: AC
  Filled 2021-07-02: qty 20

## 2021-07-02 MED ORDER — IOHEXOL 240 MG/ML SOLN
50.0000 mL | Freq: Once | INTRAMUSCULAR | Status: AC | PRN
  Administered 2021-07-02: 10 mL

## 2021-07-02 MED ORDER — FENTANYL CITRATE (PF) 100 MCG/2ML IJ SOLN
INTRAMUSCULAR | Status: AC
  Filled 2021-07-02: qty 4

## 2021-07-02 MED ORDER — COVID-19 MRNA VAC-TRIS(PFIZER) 30 MCG/0.3ML IM SUSP
0.3000 mL | Freq: Once | INTRAMUSCULAR | Status: AC
  Administered 2021-07-02: 0.3 mL via INTRAMUSCULAR
  Filled 2021-07-02: qty 0.3

## 2021-07-02 MED ORDER — MIDAZOLAM HCL 2 MG/2ML IJ SOLN
INTRAMUSCULAR | Status: AC | PRN
  Administered 2021-07-02: 0.5 mg via INTRAVENOUS
  Administered 2021-07-02: 1 mg via INTRAVENOUS

## 2021-07-02 MED ORDER — MIDAZOLAM HCL 2 MG/2ML IJ SOLN
INTRAMUSCULAR | Status: AC
  Filled 2021-07-02: qty 4

## 2021-07-02 MED ORDER — DEXTROSE 10 % IV SOLN: INTRAVENOUS | Status: DC

## 2021-07-02 MED ORDER — FENTANYL CITRATE (PF) 100 MCG/2ML IJ SOLN
INTRAMUSCULAR | Status: AC | PRN
  Administered 2021-07-02: 50 ug via INTRAVENOUS
  Administered 2021-07-02: 25 ug via INTRAVENOUS

## 2021-07-02 NOTE — Progress Notes (Signed)
[facility] PROGRESS NOTE  Heron NayLinda T Osten ZOX:[id number] DOB: 11/17/1945 DOA: 06/10/2021 PCP: Darrow BussingKoirala, Dibas, MD  Status: Remains inpatient appropriate because:Persistent severe electrolyte disturbances, Altered mental status, Unsafe d/c plan, IV treatments appropriate due to intensity of illness or inability to take PO, and Inpatient level of care appropriate due to severity of illness  Dispo: The patient is from: Home              Anticipated d/c is to: SNF              Patient currently is not medically stable to d/c.   Difficult to place patient No    Level of care: Progressive  Code Status: Full Family Communication:  DVT prophylaxis: Changed to SQ heparin given estimated GFR 29 COVID vaccination status: Pfizer 02/06/2020 and 02/28/2020; COVID booster ordered for 07/02/2021  Foley catheter: Yes POA: No Date inserted: 06/16/21 Still needed?/Rationale: Yes-accurate I's/O while hydrating for AKI and hypernatremia  HPI: 75 y.o. female with a history of alcohol use who presented to the ED for frequent falls, generalized weakness, and confusion. She was found to be afebrile, hypertensive, and without hypoxia. Also had hypokalemia (K 2.6), SCr 1.88 (above baseline ~1.5), LFT elevation (AST 576, ALT 305). Ammonia normal. EtOH, salicylate, tylenol levels negative. CT head and cervical spine were reassuring, though the patient was unable to rise to seated position from laying prompting consultation for admission. The patient was given potassium supplementation, started on CIWA protocol, and admitted with note made of continued AMS. She has remained weak and confused, though on 8/5 became less interactive, retaining urine, WBC noted to be rising. Blood and urine cultures ordered, ceftriaxone given with bacteriuria and urinary retention. MR brain has not been done yet. Neurology consulted with worsening mental status and nuchal rigidity. LP under fluoro requested, empirically treating  meningitis/encephalitis.   Subjective: Patient awake and does engage in conversation.  Denies pain.  Updated her on upcoming PEG tube placement today and what to expect.  As I was leaving the room I did tell her we would try to get her out of bed to the chair at least once a shift to which the patient responded "I did good yesterday" as to her excellent therapy session yesterday where she was able to sit on the edge of the bed minimally assisted for 3 minutes.  Objective: Vitals:   07/02/21 0404 07/02/21 0734  BP: (!) 113/55 (!) 101/50  Pulse: (!) 111 100  Resp: (!) 23 19  Temp: 98.3 F (36.8 C) 98.1 F (36.7 C)  SpO2: 98% 98%    Intake/Output Summary (Last 24 hours) at 07/02/2021 0759 Last data filed at 07/02/2021 0755 Gross per 24 hour  Intake 200 ml  Output 1850 ml  Net -1650 ml   Filed Weights   06/24/21 0400 06/25/21 0336 06/28/21 0500  Weight: 78.6 kg 78.8 kg 85.7 kg    Exam: Constitutional: NAD, awake and conversant Pulmonary: Anterior lung sounds remain clear to auscultation, no increased work of breathing.  Stable on room air with O2 saturation 99-100%. Cardiovascular: Sinus tach/sinus rhythm with frequent unifocal PVCs, normotensive.  Focal edema of upper extremities.  Abdomen: Soft and nontender with normoactive bowel sounds.  Cortrak in place for enteral feedings which are currently on hold secondary to upcoming PEG tube placement.  LBM 8/21 Genitourinary: Foley catheter in place with amber-colored urine  Musculoskeletal: Still has some preferential positioning of head and neck to the right but overall after receiving medications is able  to move head to the midline.  Still has contracture has pain with AROM but no resting pain Skin: Right anterior neck incision approximated healing well Neurologic: CN 2-12 grossly intact. Sensation intact, Strength 2/5 x all 4 extremities.  Psychiatric: Awake and oriented x 3.  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; meningitis ruled out UTI (POA);  - Neuro, ID, and Neurosurgery consulted -TTE: No vegetation/endocarditis ID initially concerned for possible systemic or central etiology given multiple sites of staph infection -S/P I/D on  06/16/21 - copious amounts -Drainage involving longus colli with myositis, discitis, osteomyelitis, large epidural abscess s/p drainage cultures positive for MSSA -Completed 2 weeks of nafcillin and is now on Ancef until 10/2 -Continue rifampin for duration of treatment -Continue low-dose Skelaxin TID for neck spasm and contracture  -Continue Oxy IR scheduled q 8 hrs -Low-grade fevers over the weekend (8/21-8/22) without associated neck pain.  No leukocytosis chest x-ray showed either infiltrates versus edema thus treated as edema since she was 9 L positive.  No changes made to antibiotic and plan is to continue to follow.  No further fevers as of 8/23-blood cultures NGTD  Acute metabolic encephalopathy 2/2 sepsis physiology and persistent hypernatremia/associated profound dysphagia - Neurology consulted - bedside LP unable to be performed  - MRI remarkable for osteo/epidural abscess as above -SLP following and given altered mentation and overall weakness unable to allow oral intake at this time noting patient unable to swallow adequately -cortrack for feedings and free water/meds and as of 8/18 have consulted IR for PEG tube placement  Epistaxis -Had epistaxis overnight but unable to achieve hemostasis with conservative measures (Gore-Tex packing) -ENT/Dr. Ezzard Standing evaluated patient and cauterized bleeding vessel in right anterior nostril with silver nitrate, aspirin and heparin stopped. -No further bleeding   Macrocytosis /anemia of critical illness as well as associated  chronic kidney disease -8/17 hemoglobin 7.9 (?  Hemodilution)-on 8/18 even after recent epistaxis hemoglobin 8.0 -Hgb down to 6.7 after recent epistaxis she was given 1 unit of PRBCs with improvement of hemoglobin to 8.2.  Once again hemoglobin down to 6.8 on 8/21 and was given an additional unit of PRBCs as of 8/23 hemoglobin stable at 8.9  Thrombocytopenia  -platelets 140,000 on 8/9 and several days later decreased to just above 100,000 -8/17 platelets 105,000 and as of 8/23 platelets up to 146,000 -Aspirin and heparin discontinued in context of epistaxis and it is unlikely we will be able to resume anticoagulants for DVT prophylaxis -Was not checked for HIT   Hypernatremic/hyperchloremic, improving -Likely secondary to previous IV fluid boluses and concurrent IV medications  -Sodium normalized after administration of D5 1/2NS.  -Continue Free water -Patient hypoalbuminemic and was given 25% albumin x4 runs on 8/17  AKI on stage IIIb CKD, stabilizing:  -Baseline renal function: BUN 24 and creatinine 1.5 with GFR slightly greater than 30 - 8/23 BUN 34 and creatinine 1.56 after given IV Lasix for mild volume overload - Monitor renal function given requirement for vancomycin -Renal function has been normalized for greater than 1 week therefore will discontinue Foley catheter.   UTI, POA, resolved:  - Covered by above abx - treatment completed   Ambulatory dysfunction with fall at home, severe weakness:  - Secondary to above - PT/OT currently recommending SNF rehabilitation once acute infectious process under better control -    Alcoholic hepatitis:  - Based on  AST predominance of LFT elevations. Hepatitis panel nonreactive.  Family confirms that patient drank 2 beers per day prior to admission - No indication for prednisolone at this time. Continue trending. - AST/ALT downtrending - bilirubin increased marginally    Alcohol abuse:  - Per family - 2 beers per day of late, etoh level  negative in ED.  - Cessation counseling to be repeated once pt more clear-headed. - Empirically supplementing thiamine, folate, MVM.   Hypokalemia: -8/16 potassium was low at 3.1 and after repletion has increased to 3.5 -8/22 potassium 4.2 but on 8/23 potassium down to 3.6.  Patient also having unifocal PVCs.  Continue low-dose daily potassium per tube and follow labs.  Goal for potassium should be >/= 4.0   Severe protein caloric malnutrition  -Continue tube feeds and free water flushes via cortrack -Repeat albumin level on 8/19 was 1.2-remain down to 1.0 as of 8/23 -Mental status continues to influence patient's ability to eat safely.  For PEG today 8/20 Nutrition Problem: Moderate Malnutrition Etiology: chronic illness Signs/Symptoms: moderate muscle depletion, moderate fat depletion, mild muscle depletion, mild fat depletion Interventions: Prostat, Tube feeding  HTN/grade 1 diastolic dysfunction: -Hold ARB given AKI -Continue Norvasc -Echo this admission with an EF of 65 to 70% with right 1 diastolic physiology, also noted with mild MR and AR -Was +9 L after IV fluid rehydration therefore was given one-time dose of IV Lasix over the weekend   HLD: - Hold statin for now; AST has decreased to 51 and ALT normal at 38 total bili slightly elevated at 1.3      Data Reviewed: Basic Metabolic Panel: Recent Labs  Lab 06/28/21 0639 06/29/21 0140 06/30/21 0343 07/01/21 0431 07/02/21 0400  NA 149* 141 136 136 137  K 3.4* 3.4* 3.8 4.2 3.6  CL 117* 111 108 106 106  CO2 25 22 21* 21* 22  GLUCOSE 171* 186* 170* 121* 74  BUN 48* 42* 36* 38* 34*  CREATININE 1.58* 1.56* 1.58* 1.53* 1.56*  CALCIUM 8.0* 7.8* 7.4* 7.6* 8.0*   Liver Function Tests: Recent Labs  Lab 06/28/21 0639 07/02/21 0400  AST  --  26  ALT  --  13  ALKPHOS  --  132*  BILITOT  --  1.7*  PROT  --  5.5*  ALBUMIN 1.2* 1.0*   No results for input(s): LIPASE, AMYLASE in the last 168 hours. No results for  input(s): AMMONIA in the last 168 hours. CBC: Recent Labs  Lab 06/27/21 0543 06/27/21 1336 06/28/21 0639 06/28/21 1758 06/30/21 0343 07/01/21 0431 07/02/21 0400  WBC 7.6  --  7.4  --  7.1 7.3 7.3  HGB 8.0*   < > 6.7* 8.2* 6.8* 8.7* 8.9*  HCT 26.1*   < > 21.4* 25.0* 21.9* 26.7* 27.9*  MCV 104.0*  --  103.9*  --  97.8 94.3 94.6  PLT 106*  --  109*  --  114* 117* 146*   < > = values in this interval not displayed.   Cardiac Enzymes: No results for input(s): CKTOTAL, CKMB, CKMBINDEX, TROPONINI in the last 168 hours. BNP (last 3 results) No results for input(s): BNP in the last 8760 hours.  ProBNP (last 3 results) No results for input(s): PROBNP in the last 8760 hours.  CBG: Recent Labs  Lab 07/01/21 1644 07/01/21 2039 07/01/21 2309 07/02/21 0357 07/02/21 0731  GLUCAP 112* 108* 116* 68* 79    Recent Results (from the past 240 hour(s))  Culture, blood (Routine X 2) w  Reflex to ID Panel     Status: None (Preliminary result)   Collection Time: 06/30/21  8:40 AM   Specimen: BLOOD  Result Value Ref Range Status   Specimen Description BLOOD BLOOD RIGHT HAND  Final   Special Requests AEROBIC BOTTLE ONLY Blood Culture adequate volume  Final   Culture   Final    NO GROWTH < 24 HOURS Performed at [facility], [address] [place], [place] [address]    Report Status PENDING  Incomplete  Culture, blood (Routine X 2) w Reflex to ID Panel     Status: None (Preliminary result)   Collection Time: 06/30/21  8:40 AM   Specimen: BLOOD  Result Value Ref Range Status   Specimen Description BLOOD RIGHT ANTECUBITAL  Final   Special Requests   Final    BOTTLES DRAWN AEROBIC AND ANAEROBIC Blood Culture results may not be optimal due to an inadequate volume of blood received in culture bottles   Culture   Final    NO GROWTH < 24 HOURS Performed at [facility], [address], [place], [place] [address]    Report Status PENDING  Incomplete      Studies: DG CHEST PORT  1 VIEW  Result Date: 06/30/2021 CLINICAL DATA:  Chest xray ordered because of fever. EXAM: PORTABLE CHEST - 1 VIEW COMPARISON:  06/14/2021 FINDINGS: Increase in ill-defined interstitial and airspace opacities in the lung bases, left greater than right. Heart size and mediastinal contours are within normal limits. Blunting of left lateral costophrenic angle suggesting small effusion. No pneumothorax. Surgical fixation hardware partially seen. Feeding tube extends at least as far as the stomach, tip not seen. IMPRESSION: Worsening bibasilar infiltrates or edema, left greater than right. Possible small left effusion. Electronically Signed   By: Corlis Leak M.D.   On: 06/30/2021 12:16    Scheduled Meds:  sodium chloride   Intravenous Once   allopurinol  300 mg Per Tube Daily   amLODipine  10 mg Per Tube Daily   Chlorhexidine Gluconate Cloth  6 each Topical Daily   COVID-19 mRNA Vac-TriS (Pfizer)  0.3 mL Intramuscular Once   diclofenac Sodium  4 g Topical QID   folic acid  1 mg Per Tube Daily   free water  125 mL Per Tube Q4H   metaxalone  400 mg Per Tube TID   multivitamin  15 mL Per Tube Daily   oxyCODONE  2.5 mg Per Tube Q8H   potassium chloride  20 mEq Per Tube Daily   rifampin  300 mg Oral Q12H   thiamine injection  100 mg Intravenous Daily   Continuous Infusions:   ceFAZolin (ANCEF) IV Stopped (07/01/21 2140)   dextrose 50 mL/hr at 07/02/21 2703   dextrose     feeding supplement (OSMOLITE 1.2 CAL) Stopped (07/02/21 0020)    Principal Problem:   Abscess in epidural space of cervical spine Active Problems:   Fall   Alcoholic hepatitis   Altered mental status   Acute delirium   Sepsis (HCC)   Malnutrition of moderate degree   Consultants: Neurology Psychiatry Infectious disease Neurosurgery  Procedures: Echocardiogram Cervical discectomy and fusion on 06/16/2021  Antibiotics: Acyclovir on 8/5 and 8/6 Ampicillin 8/5 through 8/8 Ceftriaxone 8/5 through 8/9 Vancomycin 8/6  through 8/8 Nafcillin 8/9 >> Rifampin 8/12 >>   Time spent: 45 minutes    Junious Silk ANP  [facility] 7 am - 330 pm/M-F for direct patient care and secure chat Please  refer to Amion for contact info 21  days

## 2021-07-02 NOTE — Plan of Care (Signed)
  Problem: Elimination: Goal: Will not experience complications related to urinary retention Outcome: Progressing   Problem: Pain Managment: Goal: General experience of comfort will improve Outcome: Progressing   Problem: Safety: Goal: Ability to remain free from injury will improve Outcome: Progressing   

## 2021-07-02 NOTE — TOC Progression Note (Signed)
Transition of Care [facility]) - Progression Note    Patient Details  Name: Kristy [name] MRN: [id number] Date of Birth: 05-09-1946  Transition of Care [facility]) CM/SW Contact  [name], RN Phone Number: 07/02/2021, 8:00 AM  Clinical Narrative:    Case management spoke with the patient's daughter, [name] and she is agreeable for patient's placement at [facility] in [place], [place].  The daughter agreed to have patient receive a COVID booster prior to admission to a SNF facility.    Patient is scheduled to have a PEG placed today.  CM and MSW will continue to follow the patient for SNF placement.   Expected Discharge Plan: Skilled Nursing Facility Barriers to Discharge: Continued Medical Work up  Expected Discharge Plan and Services Expected Discharge Plan: Skilled Nursing Facility In-house Referral: Clinical Social Work Discharge Planning Services: CM Consult Post Acute Care Choice: Skilled Nursing Facility Living arrangements for the past 2 months: Single Family Home                                       Social Determinants of Health (SDOH) Interventions    Readmission Risk Interventions No flowsheet data found.

## 2021-07-02 NOTE — Progress Notes (Signed)
Pt foley has been removed this shift, pt has a purwick in place

## 2021-07-02 NOTE — Progress Notes (Signed)
[facility] night shift PCU coverage note.  The nursing staff reported that the patient's CBG was 68 mg/dL.  Tube liquid feeding was stopped at midnight due to procedure today.  Dextrose 10% 50 mL/h was ordered.  Sanda Klein, MD.

## 2021-07-02 NOTE — Progress Notes (Signed)
SLP Cancellation Note  Patient Details Name: DAILAH OPPERMAN MRN: [id number] DOB: December 06, 1945   Cancelled treatment:       Reason Eval/Treat Not Completed: Other (comment) (pt NPO for planned PEG placement this date); will follow up for dysphagia intervention.    Ardyth Gal MA, CCC-SLP Acute Rehabilitation Services   07/02/2021, 11:03 AM

## 2021-07-03 LAB — URINALYSIS, COMPLETE (UACMP) WITH MICROSCOPIC
Bilirubin Urine: NEGATIVE
Glucose, UA: NEGATIVE mg/dL
Ketones, ur: NEGATIVE mg/dL
Nitrite: NEGATIVE
Protein, ur: NEGATIVE mg/dL
Specific Gravity, Urine: 1.013 (ref 1.005–1.030)
WBC, UA: >50 WBC/hpf — ABNORMAL HIGH (ref 0–5)
pH: 6 (ref 5.0–8.0)

## 2021-07-03 LAB — GLUCOSE, CAPILLARY
Glucose-Capillary: 131 mg/dL — ABNORMAL HIGH (ref 70–99)
Glucose-Capillary: 144 mg/dL — ABNORMAL HIGH (ref 70–99)
Glucose-Capillary: 144 mg/dL — ABNORMAL HIGH (ref 70–99)
Glucose-Capillary: 63 mg/dL — ABNORMAL LOW (ref 70–99)
Glucose-Capillary: 69 mg/dL — ABNORMAL LOW (ref 70–99)

## 2021-07-03 MED ORDER — ACETAMINOPHEN 160 MG/5ML PO SOLN
650.0000 mg | Freq: Four times a day (QID) | ORAL | Status: DC
  Administered 2021-07-03 – 2021-07-11 (×31): 650 mg
  Filled 2021-07-03 (×30): qty 20.3

## 2021-07-03 MED ORDER — THIAMINE HCL 100 MG PO TABS
100.0000 mg | ORAL_TABLET | Freq: Every day | ORAL | Status: DC
  Administered 2021-07-03 – 2021-07-11 (×9): 100 mg
  Filled 2021-07-03 (×9): qty 1

## 2021-07-03 MED ORDER — DOXAZOSIN MESYLATE 2 MG PO TABS
4.0000 mg | ORAL_TABLET | Freq: Every day | ORAL | Status: DC
  Administered 2021-07-03 – 2021-07-05 (×3): 4 mg
  Filled 2021-07-03 (×3): qty 2

## 2021-07-03 MED ORDER — OSMOLITE 1.2 CAL PO LIQD
1000.0000 mL | ORAL | Status: DC
  Administered 2021-07-03 – 2021-07-11 (×10): 1000 mL
  Filled 2021-07-03 (×14): qty 1000

## 2021-07-03 MED ORDER — TAMSULOSIN HCL 0.4 MG PO CAPS
0.4000 mg | ORAL_CAPSULE | Freq: Every day | ORAL | Status: DC
  Filled 2021-07-03: qty 1

## 2021-07-03 NOTE — Progress Notes (Addendum)
[facility] PROGRESS NOTE  KHAI TORBERT UMP:[id number] DOB: [birth year] DOA: 06/10/2021 PCP: Darrow Bussing, MD  Status: Remains inpatient appropriate because:Persistent severe electrolyte disturbances, Altered mental status, Unsafe d/c plan, IV treatments appropriate due to intensity of illness or inability to take PO, and Inpatient level of care appropriate due to severity of illness  Dispo: The patient is from: Home              Anticipated d/c is to: SNF              Patient currently is not medically stable to d/c.   Difficult to place patient No    Level of care: Progressive  Code Status: Full Family Communication: Patient only-TOC staff have been communicating with patient's family regarding disposition DVT prophylaxis: SCDs COVID vaccination status: Pfizer 02/06/2020 and 02/28/2020; COVID booster ordered for 07/02/2021  Foley catheter: Yes POA: No Date inserted: 06/16/21-dc'd 8/23 but replaced on 8/24 secondary to recurrent urinary retention Still needed?/Rationale: Yes-recurrent urinary retention-needs eventual bladder training prior to removal  HPI: 75 y.o. female with a history of alcohol use who presented to the ED for frequent falls, generalized weakness, and confusion. She was found to be afebrile, hypertensive, and without hypoxia. Also had hypokalemia (K 2.6), SCr 1.88 (above baseline ~1.5), LFT elevation (AST 576, ALT 305). Ammonia normal. EtOH, salicylate, tylenol levels negative. CT head and cervical spine were reassuring, though the patient was unable to rise to seated position from laying prompting consultation for admission. The patient was given potassium supplementation, started on CIWA protocol, and admitted with note made of continued AMS. She has remained weak and confused, though on 8/5 became less interactive, retaining urine, WBC noted to be rising. Blood and urine cultures ordered, ceftriaxone given with bacteriuria and urinary retention. MR brain has not been  done yet. Neurology consulted with worsening mental status and nuchal rigidity. LP under fluoro requested, empirically treating meningitis/encephalitis.   Subjective: Patient awake, still with preferential turning of head to the right.  States neck and upper back pain improved.  With palpation of lower abdomen over suprapubic region patient was noted to say ouch and grimaces with repeated efforts at palpation.  Objective: Vitals:   07/02/21 2349 07/03/21 0410  BP: 110/67 115/70  Pulse: 100 98  Resp: (!) 21 19  Temp: 98.6 F (37 C) 98 F (36.7 C)  SpO2: 99% 99%    Intake/Output Summary (Last 24 hours) at 07/03/2021 0736 Last data filed at 07/03/2021 0650 Gross per 24 hour  Intake 776.08 ml  Output 2250 ml  Net -1473.92 ml   Filed Weights   06/24/21 0400 06/25/21 0336 06/28/21 0500  Weight: 78.6 kg 78.8 kg 85.7 kg    Exam: Constitutional: NAD, awake and conversant Pulmonary: On room air with normal O2 saturations, anterior lung sounds clear slightly diminished in the bases.  No increased work of breathing while supine in bed Cardiovascular: Sinus rhythm with frequent unifocal PVCs, normotensive.  Focal edema of upper extremities.  Normotensive Abdomen: Soft and focally tender over lower abdomen/suprapubic region with normoactive bowel sounds.  PEG tube in place.  LBM 8/21 Genitourinary: Has required 2 separate I's/O catheterizations with large volume urine return between 900 to 1100 cc Musculoskeletal: Still has some preferential positioning of head and neck to the right but overall after receiving medications is able to move head to the midline.  Still has contracture has pain with AROM but no resting pain Skin: Right anterior neck incision approximated healing well Neurologic:  CN 2-12 grossly intact. Sensation intact, Strength 2/5 x all 4 extremities.  Psychiatric: Awake and oriented x name. Confused but pleasant. During therapy session today extremely confused and  hallucinating.  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  Acute metabolic encephalopathy 2/2 sepsis physiology and persistent hypernatremia/associated profound dysphagia - Neurology consulted - bedside LP unable to be performed  - MRI remarkable for osteo/epidural abscess as above -PEG tube placed 8/23 and will resume tube feedings as prior  Recurrent urinary retention -Foley catheter discontinued on 8/23 and required I/O catheterization with large volume retained urine x 2 after removal -Begin Flomax -Check urinalysis and culture to rule out urinary tract infection as culprit -Insert Foley catheter -Likely can begin bladder training by clamping catheter at intervals in the next few days  Epistaxis Resolved -ENT/Dr. Ezzard Standing evaluated patient and cauterized bleeding vessel in right anterior nostril with silver nitrate, aspirin and heparin stopped. -No further bleeding   Macrocytosis /anemia of critical illness as well as associated chronic kidney disease -Hgb nadir 6.7 after recent epistaxis she was given 1 unit of PRBCs with improvement of hemoglobin to 8.2.  Once again hemoglobin down to 6.8 on 8/21 and was given an additional unit of PRBCs as of 8/23  hemoglobin stable at 8.9  Thrombocytopenia  -platelets 140,000 on 8/9 and several days later decreased to just above 100,000 -8/17 platelets 105,000 and as of 8/23 platelets up to 146,000 -Aspirin and heparin discontinued in context of epistaxis and it is unlikely we will be able to resume anticoagulants for DVT prophylaxis   Hypernatremic/hyperchloremic, improving -Likely secondary to previous IV fluid boluses and concurrent IV medications  -Sodium normalized after administration of D5 1/2NS.  -Continue Free water -Patient hypoalbuminemic and was given 25% albumin x4 runs on 8/17  AKI on stage IIIb CKD, stabilizing:  -Baseline renal function: BUN 24 and creatinine 1.5 with GFR slightly greater than 30 - 8/23 BUN 34 and creatinine 1.56 after given IV Lasix for mild volume overload - Monitor renal function given requirement for vancomycin   UTI, POA, resolved:  - Covered by above abx - treatment completed   Ambulatory dysfunction with fall at home, severe weakness:  - Secondary to above - PT/OT currently recommending SNF rehabilitation once acute infectious process under better control -    Alcoholic hepatitis:  - Based on AST predominance of LFT elevations. Hepatitis panel nonreactive.  Family confirms that patient drank 2 beers per day prior to admission - No indication for prednisolone at this time. Continue trending. - AST/ALT downtrending - bilirubin increased marginally    Alcohol abuse:  - Per family - 2 beers per day of late, etoh level negative in ED.  - Cessation counseling to be repeated once pt more clear-headed. - Empirically supplementing thiamine, folate, MVM.   Hypokalemia: -8/16 potassium was low at 3.1 and after repletion has increased to 3.5 -8/22  potassium 4.2 but on 8/23 potassium down to 3.6.  Patient also having unifocal PVCs.  Continue low-dose daily potassium per tube and follow labs.  Goal for potassium should be >/= 4.0   Severe protein caloric  malnutrition  -Continue tube feeds and free water flushes via cortrack -Repeat albumin level on 8/19 was 1.2-remain down to 1.0 as of 8/23 -Mental status continues to influence patient's ability to eat safely.  For PEG today 8/20 Nutrition Problem: Moderate Malnutrition Etiology: chronic illness Signs/Symptoms: moderate muscle depletion, moderate fat depletion, mild muscle depletion, mild fat depletion Interventions: Prostat, Tube feeding  HTN/grade 1 diastolic dysfunction: -Hold ARB given AKI -Continue Norvasc -Echo this admission with an EF of 65 to 70% with right 1 diastolic physiology, also noted with mild MR and AR -Was +9 L after IV fluid rehydration therefore was given one-time dose of IV Lasix over the weekend   HLD: - Hold statin for now; AST has decreased to 51 and ALT normal at 38 total bili slightly elevated at 1.3      Data Reviewed: Basic Metabolic Panel: Recent Labs  Lab 06/28/21 0639 06/29/21 0140 06/30/21 0343 07/01/21 0431 07/02/21 0400  NA 149* 141 136 136 137  K 3.4* 3.4* 3.8 4.2 3.6  CL 117* 111 108 106 106  CO2 25 22 21* 21* 22  GLUCOSE 171* 186* 170* 121* 74  BUN 48* 42* 36* 38* 34*  CREATININE 1.58* 1.56* 1.58* 1.53* 1.56*  CALCIUM 8.0* 7.8* 7.4* 7.6* 8.0*   Liver Function Tests: Recent Labs  Lab 06/28/21 0639 07/02/21 0400  AST  --  26  ALT  --  13  ALKPHOS  --  132*  BILITOT  --  1.7*  PROT  --  5.5*  ALBUMIN 1.2* 1.0*   No results for input(s): LIPASE, AMYLASE in the last 168 hours. No results for input(s): AMMONIA in the last 168 hours. CBC: Recent Labs  Lab 06/27/21 0543 06/27/21 1336 06/28/21 0639 06/28/21 1758 06/30/21 0343 07/01/21 0431 07/02/21 0400  WBC 7.6  --  7.4  --  7.1 7.3 7.3  HGB 8.0*   < > 6.7* 8.2* 6.8* 8.7* 8.9*  HCT 26.1*   < > 21.4* 25.0* 21.9* 26.7* 27.9*  MCV 104.0*  --  103.9*  --  97.8 94.3 94.6  PLT 106*  --  109*  --  114* 117* 146*   < > = values in this interval not displayed.   Cardiac  Enzymes: No results for input(s): CKTOTAL, CKMB, CKMBINDEX, TROPONINI in the last 168 hours. BNP (last 3 results) No results for input(s): BNP in the last 8760 hours.  ProBNP (last 3 results) No results for input(s): PROBNP in the last 8760 hours.  CBG: Recent Labs  Lab 07/02/21 1215 07/02/21 1613 07/02/21 1948 07/02/21 2347 07/03/21 0409  GLUCAP 87 80 70 72 63*    Recent Results (from the past 240 hour(s))  Culture, blood (Routine X 2) w Reflex to ID Panel     Status: None (Preliminary result)   Collection Time: 06/30/21  8:40 AM   Specimen: BLOOD  Result Value Ref Range Status   Specimen Description BLOOD BLOOD RIGHT HAND  Final   Special Requests AEROBIC BOTTLE ONLY Blood Culture adequate volume  Final   Culture   Final    NO GROWTH 2 DAYS Performed at [facility], [address] [place], [place] [address]    Report Status PENDING  Incomplete  Culture, blood (Routine X 2)  w Reflex to ID Panel     Status: None (Preliminary result)   Collection Time: 06/30/21  8:40 AM   Specimen: BLOOD  Result Value Ref Range Status   Specimen Description BLOOD RIGHT ANTECUBITAL  Final   Special Requests   Final    BOTTLES DRAWN AEROBIC AND ANAEROBIC Blood Culture results may not be optimal due to an inadequate volume of blood received in culture bottles   Culture   Final    NO GROWTH 2 DAYS Performed at [facility], [address] [place], [place] [address]    Report Status PENDING  Incomplete      Studies: IR GASTROSTOMY TUBE MOD SED  Result Date: 07/02/2021 INDICATION: 75 year old female with dysphagia. She requires placement of a percutaneous gastrostomy tube. EXAM: Fluoroscopically guided placement of percutaneous pull-through gastrostomy tube Interventional Radiologist:  Sterling Big, MD MEDICATIONS: In patient currently receiving Ancef every 12 hours. No additional antibiotic prophylaxis administered. ANESTHESIA/SEDATION: Versed 1.5 mg IV; Fentanyl 75  mcg IV Moderate Sedation Time:  13 minutes The patient was continuously monitored during the procedure by the interventional radiology nurse under my direct supervision. CONTRAST:  43mL OMNIPAQUE IOHEXOL 240 MG/ML SOLN FLUOROSCOPY TIME:  Fluoroscopy Time: 2 minutes 12 seconds (13 mGy). COMPLICATIONS: None immediate. PROCEDURE: Informed written consent was obtained from the patient after a thorough discussion of the procedural risks, benefits and alternatives. All questions were addressed. Maximal Sterile Barrier Technique was utilized including caps, mask, sterile gowns, sterile gloves, sterile drape, hand hygiene and skin antiseptic. A timeout was performed prior to the initiation of the procedure. Maximal barrier sterile technique utilized including caps, mask, sterile gowns, sterile gloves, large sterile drape, hand hygiene, and chlorhexadine skin prep. An angled catheter was advanced over a wire under fluoroscopic guidance through the nose, down the esophagus and into the body of the stomach. The stomach was then insufflated with several 100 ml of air. Fluoroscopy confirmed location of the gastric bubble, as well as inferior displacement of the barium stained colon. Under direct fluoroscopic guidance, a single T-tack was placed, and the anterior gastric wall drawn up against the anterior abdominal wall. Percutaneous access was then obtained into the mid gastric body with an 18 gauge sheath needle. Aspiration of air, and injection of contrast material under fluoroscopy confirmed needle placement. An Amplatz wire was advanced in the gastric body and the access needle exchanged for a 9-French vascular sheath. A snare device was advanced through the vascular sheath and an Amplatz wire advanced through the angled catheter. The Amplatz wire was successfully snared and this was pulled up through the esophagus and out the mouth. A 20-French Burnell Blanks MIC-PEG tube was then connected to the snare and pulled through  the mouth, down the esophagus, into the stomach and out to the anterior abdominal wall. Hand injection of contrast material confirmed intragastric location. The T-tack retention suture was then cut. The pull through peg tube was then secured with the external bumper and capped. The patient will be observed for several hours with the newly placed tube on low wall suction to evaluate for any post procedure complication. The patient tolerated the procedure well, there is no immediate complication. IMPRESSION: Successful placement of a 20 French pull through gastrostomy tube. Electronically Signed   By: Malachy Moan M.D.   On: 07/02/2021 16:01    Scheduled Meds:  sodium chloride   Intravenous Once   allopurinol  300 mg Per Tube Daily   amLODipine  10 mg  Per Tube Daily   Chlorhexidine Gluconate Cloth  6 each Topical Daily   diclofenac Sodium  4 g Topical QID   folic acid  1 mg Per Tube Daily   free water  125 mL Per Tube Q4H   metaxalone  400 mg Per Tube TID   multivitamin  15 mL Per Tube Daily   oxyCODONE  2.5 mg Per Tube Q8H   potassium chloride  20 mEq Per Tube Daily   rifampin  300 mg Oral Q12H   thiamine injection  100 mg Intravenous Daily   Continuous Infusions:   ceFAZolin (ANCEF) IV 2 g (07/02/21 2114)   dextrose 50 mL/hr at 07/03/21 0649   feeding supplement (OSMOLITE 1.2 CAL) Stopped (07/02/21 0020)    Principal Problem:   Abscess in epidural space of cervical spine Active Problems:   Fall   Alcoholic hepatitis   Altered mental status   Acute delirium   Sepsis (HCC)   Malnutrition of moderate degree   Consultants: Neurology Psychiatry Infectious disease Neurosurgery  Procedures: Echocardiogram Cervical discectomy and fusion on 06/16/2021  Antibiotics: Acyclovir on 8/5 and 8/6 Ampicillin 8/5 through 8/8 Ceftriaxone 8/5 through 8/9 Vancomycin 8/6 through 8/8 Nafcillin 8/9 >> Rifampin 8/12 >>   Time spent: 45 minutes    Junious Silk ANP  [facility] 7 am - 330 pm/M-F for direct patient care and secure chat Please refer to Amion for contact info 22  days

## 2021-07-03 NOTE — Therapy (Signed)
Occupational Therapy Treatment Patient Details Name: Kristy [name] MRN: [id number] DOB: 1945-12-23 Today's Date: 07/03/2021    History of present illness Pt is a 75yo female presenting to [facility] ED on 8/1 after a fall on 7/29. Pt also fell on 8/2 while in the ED. Pt found to be hypokalemic upon admission. 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 Pt is now s/p drainage of abscess and C5-6 ACDF on 06/16/21. Also noted R SI septic arthritis.  PMH: HTN, polycthemia, alcohol use.   OT comments  Pt seen for bed level session this date for bilateral UE gentle P/ROM and repositioning UE's in bed for comfort/edema control. Pt with delirium throughout session believing that she was at home, with family members, on phone and hallucinating throughout. Note Pt is on bedrest.   Follow Up Recommendations  SNF;Supervision/Assistance - 24 hour    Equipment Recommendations  Other (comment) (Defer to next venue)    Recommendations for Other Services Other (comment) (Pallative consult)    Precautions / Restrictions Precautions Precautions: Fall;Cervical Precaution Booklet Issued: No Precaution Comments: NG (Scheduled for PEG placement 07/02/21)       Mobility Bed Mobility Overal bed mobility:  (Pt is on bedrest, NT)   Transfers       General transfer comment: Defer - pt on bedrest    Balance Overall balance assessment:  (Pt is on bedrest)       ADL either performed or assessed with clinical judgement   ADL Overall ADL's : Needs assistance/impaired        General ADL Comments: total A with all ADL (Pt UE's were positioned on pillow with wash cloth placed in hand(s) for proper positioning/edema control. Gentle retrograde massage performed to bilateral UE's (digits, hand, wrists).)    Cognition Arousal/Alertness: Awake/alert Behavior During Therapy: Flat affect Overall Cognitive Status: Impaired/Different from  baseline Area of Impairment: Orientation;Attention;Memory;Following commands;Safety/judgement;Awareness;Problem solving     Orientation Level: Disoriented to;Place;Time;Situation Current Attention Level: Focused Memory: Decreased recall of precautions;Decreased short-term memory Following Commands: Follows one step commands inconsistently Safety/Judgement: Decreased awareness of deficits;Decreased awareness of safety Awareness: Intellectual Problem Solving: Slow processing;Decreased initiation;Difficulty sequencing;Requires verbal cues;Requires tactile cues General Comments: Pt demented throughout session calling out to family memebers, grandchildren that are not there, stating "I'm home" when asked. Telling theraopist at times to "Look out! that stove is hot" Pt was redirected and reoritented consistently but unable to recall that she is in the hospital. Also hallucinating/seeing family, objects from home during session.        Exercises Exercises: General Upper Extremity General Exercises - Upper Extremity Elbow Flexion: PROM;AAROM;Both;10 reps;Supine Elbow Extension: PROM;AAROM;Both;10 reps;Supine Wrist Flexion: PROM;Both;15 reps;Supine Wrist Extension: PROM;Both;15 reps Digit Composite Flexion: PROM;AAROM;Both;10 reps Composite Extension: PROM;AAROM;Both;10 reps;Supine Other Exercises Other Exercises: Gentle P/ROM for shoulder flexion limited to ~30* secondary to bedrest orders noted in chart. Pt was scheduled for PEG placement on 07/02/21      General Comments Pt was seen for bed level gentle passive ROM to bilateral UE's and repositioning of UE's in the bed. (Pt on bedrest)    Pertinent Vitals/ Pain       Pain Assessment: Faces Faces Pain Scale: Hurts little more Pain Location: generalized Pain Descriptors / Indicators: Grimacing;Other (Comment) (yelling out) Pain Intervention(s): Limited activity within patient's tolerance;Monitored during session;Repositioned  Home Living        Prior Functioning/Environment              Frequency  Min 2X/week        Progress Toward Goals  OT Goals(current goals can now be found in the care plan section)  Progress towards OT goals: Not progressing toward goals - comment  Acute Rehab OT Goals Patient Stated Goal: Pt unable to state. OT Goal Formulation: Patient unable to participate in goal setting Time For Goal Achievement: 07/09/21 Potential to Achieve Goals: Fair  Plan Discharge plan remains appropriate    AM-PAC OT "6 Clicks" Daily Activity     Outcome Measure   Help from another person eating meals?: Total Help from another person taking care of personal grooming?: Total Help from another person toileting, which includes using toliet, bedpan, or urinal?: Total Help from another person bathing (including washing, rinsing, drying)?: Total Help from another person to put on and taking off regular upper body clothing?: Total Help from another person to put on and taking off regular lower body clothing?: Total 6 Click Score: 6    End of Session    OT Visit Diagnosis: Unsteadiness on feet (R26.81);Other abnormalities of gait and mobility (R26.89);Repeated falls (R29.6);Muscle weakness (generalized) (M62.81);Other symptoms and signs involving cognitive function;Pain Pain - part of body:  (Generalized)   Activity Tolerance Patient limited by fatigue;Patient limited by lethargy;Patient limited by pain;Other (comment) (Bedrest)   Patient Left in bed;with call bell/phone within reach;with bed alarm set   Nurse Communication          Time: [phone] OT Time Calculation (min): 24 min  Charges: OT General Charges $OT Visit: 1 Visit OT Treatments $Therapeutic Activity: 23-37 mins   Estephanie Hubbs Beth Dixon, OTR/L 07/03/2021, 10:35 AM

## 2021-07-03 NOTE — TOC Progression Note (Signed)
Transition of Care [facility]) - Progression Note    Patient Details  Name: Kristy [name] MRN: [id number] Date of Birth: 1946-01-12  [facility]) CM/SW Contact  [name], RN Phone Number: 07/03/2021, 12:54 PM  Clinical Narrative:    Case management spoke with Ginette Pitman, CM at [facility] and I informed the facility that the daughter was offered Medicare choice for SNF placement and she chose [facility] as the accepting facility for care.  I spoke with Junious Silk, NP and she states that the patient is not medically ready for discharge until the first of next week.  I made [facility] SN[facility] aware of pending discharge and they will reserve an admission bed for the patient.  I called the daughter but was unable to leave a message on her phone since her voicemail was full.   Expected Discharge Plan: Skilled Nursing Facility Barriers to Discharge: Continued Medical Work up  Expected Discharge Plan and Services Expected Discharge Plan: Skilled Nursing Facility In-house Referral: Clinical Social Work Discharge Planning Services: CM Consult Post Acute Care Choice: Skilled Nursing Facility Living arrangements for the past 2 months: Single Family Home                                       Social Determinants of Health (SDOH) Interventions    Readmission Risk Interventions No flowsheet data found.

## 2021-07-03 NOTE — Progress Notes (Signed)
[facility] night shift PCU coverage note.  The nursing staff reported that the bladder scan showed 949 mL of urine.  In and out cath as needed ordered.  Sanda Klein, MD.

## 2021-07-03 NOTE — Progress Notes (Signed)
SLP Cancellation Note  Patient Details Name: Kristy [name] MRN: [id number] DOB: 15-Dec-1945   Cancelled treatment:       Reason Eval/Treat Not Completed: Patient declined, no reason specified (Pt was approached for dysphagia, but she refused repositioning and any p.o. intake despite encouragement and education.)  [name] I. Vear Clock, MS, CCC-SLP Acute Rehabilitation Services Office number [phone] Pager [phone]  [name] 07/03/2021, 4:48 PM

## 2021-07-04 LAB — COMPREHENSIVE METABOLIC PANEL
ALT: 7 U/L (ref 0–44)
AST: 23 U/L (ref 15–41)
Albumin: <1 g/dL — ABNORMAL LOW (ref 3.5–5.0)
Alkaline Phosphatase: 124 U/L (ref 38–126)
Anion gap: 10 (ref 5–15)
BUN: 29 mg/dL — ABNORMAL HIGH (ref 8–23)
CO2: 21 mmol/L — ABNORMAL LOW (ref 22–32)
Calcium: 8.2 mg/dL — ABNORMAL LOW (ref 8.9–10.3)
Chloride: 105 mmol/L (ref 98–111)
Creatinine, Ser: 1.39 mg/dL — ABNORMAL HIGH (ref 0.44–1.00)
GFR, Estimated: 40 mL/min — ABNORMAL LOW (ref >60)
Glucose, Bld: 196 mg/dL — ABNORMAL HIGH (ref 70–99)
Potassium: 3.5 mmol/L (ref 3.5–5.1)
Sodium: 136 mmol/L (ref 135–145)
Total Bilirubin: 1.3 mg/dL — ABNORMAL HIGH (ref 0.3–1.2)
Total Protein: 5.4 g/dL — ABNORMAL LOW (ref 6.5–8.1)

## 2021-07-04 LAB — CBC
HCT: 24.4 % — ABNORMAL LOW (ref 36.0–46.0)
Hemoglobin: 7.6 g/dL — ABNORMAL LOW (ref 12.0–15.0)
MCH: 30.3 pg (ref 26.0–34.0)
MCHC: 31.1 g/dL (ref 30.0–36.0)
MCV: 97.2 fL (ref 80.0–100.0)
Platelets: 147 10*3/uL — ABNORMAL LOW (ref 150–400)
RBC: 2.51 MIL/uL — ABNORMAL LOW (ref 3.87–5.11)
RDW: 18.4 % — ABNORMAL HIGH (ref 11.5–15.5)
WBC: 5.8 10*3/uL (ref 4.0–10.5)
nRBC: 0 % (ref 0.0–0.2)

## 2021-07-04 LAB — GLUCOSE, CAPILLARY
Glucose-Capillary: 144 mg/dL — ABNORMAL HIGH (ref 70–99)
Glucose-Capillary: 145 mg/dL — ABNORMAL HIGH (ref 70–99)
Glucose-Capillary: 173 mg/dL — ABNORMAL HIGH (ref 70–99)
Glucose-Capillary: 179 mg/dL — ABNORMAL HIGH (ref 70–99)
Glucose-Capillary: 191 mg/dL — ABNORMAL HIGH (ref 70–99)
Glucose-Capillary: 191 mg/dL — ABNORMAL HIGH (ref 70–99)

## 2021-07-04 LAB — C-REACTIVE PROTEIN: CRP: 21.5 mg/dL — ABNORMAL HIGH (ref <1.0)

## 2021-07-04 LAB — LACTIC ACID, PLASMA
Lactic Acid, Venous: 2 mmol/L (ref 0.5–1.9)
Lactic Acid, Venous: 1.7 mmol/L (ref 0.5–1.9)

## 2021-07-04 LAB — PHOSPHORUS: Phosphorus: 4.5 mg/dL (ref 2.5–4.6)

## 2021-07-04 MED ORDER — SODIUM CHLORIDE 0.9 % IV SOLN
2.0000 g | INTRAVENOUS | Status: DC
  Administered 2021-07-04 – 2021-07-07 (×4): 2 g via INTRAVENOUS
  Filled 2021-07-04 (×4): qty 20

## 2021-07-04 MED ORDER — SODIUM CHLORIDE 0.9 % IV SOLN: 1.0000 g | INTRAVENOUS | Status: DC

## 2021-07-04 MED ORDER — ALBUMIN HUMAN 25 % IV SOLN
12.5000 g | Freq: Four times a day (QID) | INTRAVENOUS | Status: AC
Start: 2021-07-04 — End: 2021-07-05
  Administered 2021-07-04 – 2021-07-05 (×4): 12.5 g via INTRAVENOUS
  Filled 2021-07-04 (×4): qty 50

## 2021-07-04 MED ORDER — FUROSEMIDE 10 MG/ML IJ SOLN
60.0000 mg | Freq: Once | INTRAMUSCULAR | Status: DC
  Filled 2021-07-04: qty 6

## 2021-07-04 NOTE — Plan of Care (Signed)
  Problem: Nutrition: Goal: Adequate nutrition will be maintained Outcome: Progressing   Problem: Elimination: Goal: Will not experience complications related to bowel motility Outcome: Progressing   Problem: Safety: Goal: Ability to remain free from injury will improve Outcome: Progressing   

## 2021-07-04 NOTE — Plan of Care (Signed)

## 2021-07-04 NOTE — Progress Notes (Signed)
Lactic Acid Result: 2  NP Rennis Harding notified of lab result.

## 2021-07-04 NOTE — Progress Notes (Signed)
Physical Therapy Treatment Patient Details Name: Kristy [name] MRN: [id number] DOB: 08/08/1946 Today's Date: 07/04/2021    History of Present Illness Pt is a 75yo female presenting to [facility] ED on 8/1 after a fall on 7/29. Pt also fell on 8/2 while in the ED. Pt found to be hypokalemic upon admission. 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 Pt is now s/p drainage of abscess and C5-6 ACDF on 06/16/21. Also noted R SI septic arthritis.  PMH: HTN, polycthemia, alcohol use.    PT Comments    Pt resting upon arrival to room, initially agreeable to session. When PT began assisting pt in initiating translation to EOB, pt screaming "no" and refusing EOB activity. Pt noted to be soiled in stool, requiring bilat rolling which was total care given pt pain presentation. Pt tolerated very little LE exercise given pain, but PT encouraged continued AROM of Ues/Les as tolerated. RN in room at end of session for foley cath cleaning s/p BM. Will continue to follow.     Follow Up Recommendations  SNF;Supervision/Assistance - 24 hour     Equipment Recommendations  Other (comment) (tbd)    Recommendations for Other Services       Precautions / Restrictions Precautions Precautions: Fall;Cervical Precaution Booklet Issued: No Precaution Comments: PEG (Scheduled for PEG placement 07/02/21) Restrictions Weight Bearing Restrictions: No Other Position/Activity Restrictions: no brace needed    Mobility  Bed Mobility Overal bed mobility: Needs Assistance Bed Mobility: Rolling Rolling: Total assist;+2 for physical assistance         General bed mobility comments: total +2 for all aspects, pt screaming in pain and shouting "no no no no!" followed by "I'm sorry, I can't help it". rolling necessary for pericare.    Transfers                 General transfer comment: nt  Ambulation/Gait                 Stairs              Wheelchair Mobility    Modified Rankin (Stroke Patients Only)       Balance                                            Cognition Arousal/Alertness: Awake/alert Behavior During Therapy: Flat affect Overall Cognitive Status: Impaired/Different from baseline Area of Impairment: Attention;Memory;Following commands;Safety/judgement;Awareness;Problem solving                   Current Attention Level: Focused Memory: Decreased recall of precautions;Decreased short-term memory Following Commands: Follows one step commands inconsistently Safety/Judgement: Decreased awareness of deficits;Decreased awareness of safety Awareness: Intellectual Problem Solving: Slow processing;Decreased initiation;Difficulty sequencing;Requires verbal cues;Requires tactile cues General Comments: Pt initially agreeable to mobility and pariticipating well, then appears shocked and taken off guard when PT begins to assist with mobility. Pt anxious and screaming when PT moving pt at all, including LEs and UEs.      Exercises General Exercises - Lower Extremity Heel Slides: AAROM;Both;10 reps;Supine Hip ABduction/ADduction: AAROM;Both;10 reps;Supine    General Comments General comments (skin integrity, edema, etc.): PT repositioned UEs on pillows to reduce distal edema, encouraged pt to open/close hands to tolerance as exercise. Pt soiled in stool, required total assist for pericare.      Pertinent Vitals/Pain Pain  Assessment: Faces Faces Pain Scale: Hurts little more Pain Location: pt specifies back, but moans in pain with any movement Pain Descriptors / Indicators: Grimacing;Discomfort;Crying;Moaning Pain Intervention(s): Limited activity within patient's tolerance;Monitored during session    Home Living                      Prior Function            PT Goals (current goals can now be found in the care plan section) Acute Rehab PT Goals Patient  Stated Goal: per family for pt to get better PT Goal Formulation: With patient Time For Goal Achievement: 07/08/21 Potential to Achieve Goals: Fair Progress towards PT goals: Not progressing toward goals - comment (pain limiting)    Frequency    Min 2X/week      PT Plan Current plan remains appropriate    Co-evaluation              AM-PAC PT "6 Clicks" Mobility   Outcome Measure  Help needed turning from your back to your side while in a flat bed without using bedrails?: Total Help needed moving from lying on your back to sitting on the side of a flat bed without using bedrails?: Total Help needed moving to and from a bed to a chair (including a wheelchair)?: Total Help needed standing up from a chair using your arms (e.g., wheelchair or bedside chair)?: Total Help needed to walk in hospital room?: Total Help needed climbing 3-5 steps with a railing? : Total 6 Click Score: 6    End of Session   Activity Tolerance: Patient limited by fatigue;Patient limited by pain Patient left: in bed;with call bell/phone within reach;with bed alarm set;with nursing/sitter in room;with SCD's reapplied Nurse Communication: Mobility status PT Visit Diagnosis: History of falling (Z91.81);Difficulty in walking, not elsewhere classified (R26.2);Muscle weakness (generalized) (M62.81);Other abnormalities of gait and mobility (R26.89)     Time: 1135-1200 PT Time Calculation (min) (ACUTE ONLY): 25 min  Charges:  $Therapeutic Activity: 8-22 mins $Self Care/Home Management: 8-22           Marye Round, PT DPT Acute Rehabilitation Services Pager [phone]  Office [phone]    Truddie Coco 07/04/2021, 2:28 PM

## 2021-07-04 NOTE — Progress Notes (Signed)
Nutrition Follow-up  DOCUMENTATION CODES:  Non-severe (moderate) malnutrition in context of chronic illness  INTERVENTION:  Continue TF via PEG: -Osmolite 1.2 cal @ 30m/hr (15645md)  -4575mrosource TF BID -Free water flushes per MD, currently 125m51mH  Provides 1952 kcals, 108 grams protein, 1279ml61me water (2029ml 28ml free water with flushes)  Recommend discontinuing liquid multivitamin and replacing with mvi with minerals (may be crushed and be administered via tube)  NUTRITION DIAGNOSIS:  Moderate Malnutrition related to chronic illness as evidenced by moderate muscle depletion, moderate fat depletion, mild muscle depletion, mild fat depletion. -- ongoing  GOAL:  Patient will meet greater than or equal to 90% of their needs -- met with TF  MONITOR:  TF tolerance, Labs, I & O's  REASON FOR ASSESSMENT:  Consult Enteral/tube feeding initiation and management  ASSESSMENT:  Pt with a PMH significant for h/o EtOH abuse who presented to the ED with frequent falls, generalized weakness, and confusion, and was admitted with acute delirium and severe sepsis 2/2 cervical epidural abscess (C2-T3/T4); cervical discitis/osteomyelitis; UTI. Pt has since become less interactive and more confused and has started retaining urine. Neurology was consulted with worsening mental status and nuchal rigidity. LP under fluoro requested, empirically treating meningitis/encephalitis.  8/06-s/p unsuccessful LP 8/07-s/p C5-C6 anterior cervical discectomy and instrumented fusion  8/08-Cortrak placed 8/12-Cortrak replaced (gastric tip per xray) 8/23-PEG placed  Foley cath was discontinued on 8/23 and required I/O catheterization with large volume retained urine x2 after removal. Per MD, will need bladder training.   Current TF: osmolite 1.2 @ 65ml/h63m5ml pr93mrce TF BID, 125ml fre63mter Q4H  Medications: folvite, lasix, liquid mvi, klor-con, thiamine Labs: BUN 29 (H), Cr 1.39 (H) CBGs  [id number]  UOP: 1.5mL x24 h28ms I/O: +6.8L since admit   Admission weight: 85.5 kg Current weight: 87.5 kg  Diet Order:   Diet Order             Diet NPO time specified  Diet effective now                  EDUCATION NEEDS:  Not appropriate for education at this time  Skin:  Skin Assessment: Skin Integrity Issues: Skin Integrity Issues:: Incisions Incisions: neck  Last BM:  8/21 type 7  Height:  Ht Readings from Last 1 Encounters:  06/14/21 '5\' 6"'  (1.676 m)   Weight:  Wt Readings from Last 1 Encounters:  07/04/21 87.5 kg   BMI:  Body mass index is 31.14 kg/m.  Estimated Nutritional Needs:  Kcal:  1900-2100 Protein:  100-120 grams Fluid:  >1.9L/d  Kristy [name] AveLarkin InaLDN (she/her/hers) RD pager number and weekend/on-call pager number located in [email or web address]

## 2021-07-04 NOTE — Progress Notes (Signed)
  Speech Language Pathology Treatment: Dysphagia  Patient Details Name: Kristy [name] MRN: [id number] DOB: November 08, 1946 Today's Date: 07/04/2021 Time: 8177-1165 SLP Time Calculation (min) (ACUTE ONLY): 14 min  Assessment / Plan / Recommendation Clinical Impression  Pt was seen for dysphagia treatment. She was adequately alert during the session and participated with encouragement. She tolerated flavored gelatin, puree solids, thin liquids via tsp, and ice chips without overt s/sx of aspiration. Coughing was inconsistently noted with thin liquids via straw. Oral clearance was adequate. Pt consistently refused additional boluses stating, "I don't want no more" and she was not amenable with  encouragement. SLP anticipates that a repeat MBS will be necessary. However, considering limited participation this week, her participation in a MBS at this time is questioned. SLP will continue to follow pt.    HPI HPI: Pt is a 75yo female presenting to [facility] ED on 8/1 after a fall on 7/29. Pt also fell on 8/2 while in the ED. Pt found to be hypokalemic upon admission. 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 Pt is now s/p drainage of abscess and C5-6 ACDF on 06/16/21. Also noted R SI septic arthritis.  PMH: HTN, polycthemia, alcohol use.      SLP Plan  Continue with current plan of care       Recommendations  Diet recommendations: NPO (ice chips following oral care still allowed) Medication Administration: Via alternative means                Oral Care Recommendations: Oral care QID;Staff/trained caregiver to provide oral care Follow up Recommendations: Skilled Nursing facility SLP Visit Diagnosis: Dysphagia, pharyngeal phase (R13.13) Plan: Continue with current plan of care       Irvan Tiedt I. Vear Clock, MS, CCC-SLP Acute Rehabilitation Services Office number [phone] Pager [phone]                Kristy [name] 07/04/2021, 5:56 PM

## 2021-07-04 NOTE — Progress Notes (Addendum)
[facility] PROGRESS NOTE  Kristy [name] RUE:[id number] DOB: 09-04-1946 DOA: 06/10/2021 PCP: Darrow Bussing, MD  Status: Remains inpatient appropriate because:Persistent severe electrolyte disturbances, Altered mental status, Unsafe d/c plan, IV treatments appropriate due to intensity of illness or inability to take PO, and Inpatient level of care appropriate due to severity of illness  Dispo: The patient is from: Home              Anticipated d/c is to: SNF              Patient currently is not medically stable to d/c.   Difficult to place patient No    Level of care: Progressive  Code Status: Full Family Communication: Updated daughter [name] on 8/25 regarding new UTI and infectious related acute delirium. DVT prophylaxis: SCDs COVID vaccination status: Pfizer 02/06/2020 and 02/28/2020; COVID booster ordered for 07/02/2021  Foley catheter: Yes POA: No Date inserted: 06/16/21-dc'd 8/23 but replaced on 8/24 secondary to recurrent urinary retention Still needed?/Rationale: Yes-recurrent urinary retention-needs eventual bladder training prior to removal  HPI: 75 y.o. female with a history of alcohol use who presented to the ED for frequent falls, generalized weakness, and confusion. She was found to be afebrile, hypertensive, and without hypoxia. Also had hypokalemia (K 2.6), SCr 1.88 (above baseline ~1.5), LFT elevation (AST 576, ALT 305). Ammonia normal. EtOH, salicylate, tylenol levels negative. CT head and cervical spine were reassuring, though the patient was unable to rise to seated position from laying prompting consultation for admission. The patient was given potassium supplementation, started on CIWA protocol, and admitted with note made of continued AMS. She has remained weak and confused, though on 8/5 became less interactive, retaining urine, WBC noted to be rising. Blood and urine cultures ordered, ceftriaxone given with bacteriuria and urinary retention. MR brain has not been  done yet. Neurology consulted with worsening mental status and nuchal rigidity. LP under fluoro requested, empirically treating meningitis/encephalitis.   Subjective: Awake and seems slightly more lethargic than she has been over the past 7 days.  Somewhat confused.  Mildly tachypneic and tachycardic  Objective: Vitals:   07/04/21 0451 07/04/21 0605  BP:    Pulse: (!) 106 (!) 105  Resp: 18 15  Temp:    SpO2:      Intake/Output Summary (Last 24 hours) at 07/04/2021 0737 Last data filed at 07/04/2021 0543 Gross per 24 hour  Intake 0 ml  Output 1500 ml  Net -1500 ml   Filed Weights   06/25/21 0336 06/28/21 0500 07/04/21 0500  Weight: 78.8 kg 85.7 kg 87.5 kg    Exam: Constitutional: NAD, awake but mildly confused Pulmonary: Room air with O2 saturation 100%, mild tachypnea in setting of known borderline lactic acidosis.  Anterior lung sounds clear slightly diminished in the bases.  No increased work of breathing while supine in bed Cardiovascular: Sinus rhythm with frequent unifocal PVCs, normotensive but on the lower end.  Mild tachycardia.  Focal edema of upper extremities.  Normotensive Abdomen: Soft and focally tender over lower abdomen/suprapubic region with normoactive bowel sounds.  PEG tube in place.  LBM 8/21 Genitourinary: Foley catheter in place draining dark yellow urine to bedside bag Musculoskeletal: Still has some preferential positioning of head and neck to the right but overall after receiving medications is able to move head to the midline.  Still has contracture has pain with AROM Neurologic: CN 2-12 grossly intact. Sensation intact, Strength 2/5 x all 4 extremities.  Psychiatric: Awake and oriented x name and  location (hospital).  Oriented to year or situation.  Attending physician at bedside during my examination pointed to this writer when she was asked "who brought you to the hospital".  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; meningitis ruled out UTI (POA);  - Neuro, ID, and Neurosurgery consulted -TTE: No vegetation/endocarditis  -S/P I/D on  06/16/21 -involving longus colli with myositis, discitis, osteomyelitis, large epidural abscess s/p drainage -cultures positive for MSSA -Completed 2 weeks of nafcillin and is now on Ancef until 10/2 -Continue rifampin for duration of treatment -Continue low-dose Skelaxin TID for neck spasm and contracture  -Continue scheduled Tylenol -Rocephin used in favor of Ancef given new UTI in context of recent indwelling catheter  Sepsis physiology  2/2 >=100,000 COLONIES/mL KLEBSIELLA ORNITHINOLYTICA /suspected CAUTI/possible associated pyelonephritis -Patient presented with pan sensitive staph UTI which was treated effectively -Patient required Foley catheter due to persistent AKI as well as at least one episode of acute urinary retention -Urine culture positive for Klebsiella; patient remains very tender over suprapubic region -Initiated empiric Rocephin IV prior to preliminary culture demonstrating Klebsiella-discussed with pharmacy and since patient otherwise clinically stable at this juncture appropriate to continue Rocephin.  Can change antibiotics based on sensitivities -Obtain blood cultures, lactic acid (2.0-rpt at 11 am down to 1.7) and CRP (21.5 which is down from 38 sample 8/8) -Sepsis physiology evidenced by altered mentation, tachycardia, source of infection-have discontinue Norvasc for now given optimal BP readings and possible evolving sepsis.  Acute delirium -Secondary to UTI and evolving sepsis physiology  Severe hypoalbuminemia -Albumin <1.0 w/ normal phosphorus -We will give 4 runs of 25% albumin every 6 hours today -Repeat labs in a.m. -Continue tube feeding as ordered  Acute metabolic encephalopathy 2/2 sepsis physiology and persistent  hypernatremia/associated profound dysphagia - Neurology consulted - bedside LP unable to be performed  - MRI remarkable for osteo/epidural abscess as above -PEG tube placed 8/23 and will resume tube feedings as prior  Recurrent urinary retention -Foley catheter discontinued on 8/23 and required I/O catheterization with large volume retained urine x 2 after removal -Unable to use Flomax since it will clog feeding tube so have changed to Cardura -Continue Foley cath -Likely can begin bladder training by clamping catheter at intervals in the next few days  Epistaxis Resolved -ENT/Dr. Ezzard Standing evaluated patient and cauterized bleeding vessel in right anterior nostril with silver nitrate, aspirin and heparin stopped. -No further bleeding   Macrocytosis /anemia of critical illness as well as associated chronic kidney disease -Hgb nadir 6.7 after recent epistaxis she was given 1 unit of PRBCs with improvement of hemoglobin to 8.2.  Once again hemoglobin down to 6.8 on 8/21 and was given an additional unit of PRBCs as of 8/25 hemoglobin has drifted down to 7.6.  This could be reflective of recent IV fluids for 24 hours while awaiting PEG placement.  Could also be reflective of anemia of acute illness/critical illness since now she appears to be having an evolving sepsis picture  Thrombocytopenia  -platelets 140,000 on 8/9 and several days later decreased to just above 100,000 -8/17 platelets 105,000 and as of 8/25 platelets 147,000 -Aspirin and heparin discontinued in context of epistaxis and it is unlikely we will be able to resume anticoagulants for DVT prophylaxis   Hypernatremic/hyperchloremic, improving -Likely secondary to previous IV fluid boluses and concurrent IV medications  -Sodium normalized after administration of D5 1/2NS.  -Continue Free water -  Patient hypoalbuminemic and was given 25% albumin x4 runs on 8/17  AKI on stage IIIb CKD, stabilizing:  -Baseline renal function: BUN 24  and creatinine 1.5 with GFR slightly greater than 30 - 8/25 BUN 29 and creatinine 1.39 - Monitor renal function given requirement for vancomycin   UTI, POA, resolved:  - Covered by above abx - treatment completed   Ambulatory dysfunction with fall at home, severe weakness:  - Secondary to above - PT/OT currently recommending SNF rehabilitation once acute infectious process under better control -    Alcoholic hepatitis:  - Based on AST predominance of LFT elevations. Hepatitis panel nonreactive.  Family confirms that patient drank 2 beers per day prior to admission - No indication for prednisolone at this time. Continue trending. - AST/ALT downtrending - bilirubin increased marginally    Alcohol abuse:  - Per family - 2 beers per day of late, etoh level negative in ED.  - Cessation counseling to be repeated once pt more clear-headed. - Empirically supplementing thiamine, folate, MVM.   Hypokalemia: -8/16 potassium was low at 3.1 and after repletion has increased to 3.5 -8/22 potassium 4.2 but on 8/23 potassium down to 3.6.  Patient also having unifocal PVCs.  Continue low-dose daily potassium per tube and follow labs.  Goal for potassium should be >/= 4.0   Severe protein caloric malnutrition  -Continue tube feeds and free water flushes via cortrack -Repeat albumin level on 8/19 was 1.2-remain down to 1.0 as of 8/23 -Mental status continues to influence patient's ability to eat safely.  For PEG today 8/20 Nutrition Problem: Moderate Malnutrition Etiology: chronic illness Signs/Symptoms: moderate muscle depletion, moderate fat depletion, mild muscle depletion, mild fat depletion Interventions: Prostat, Tube feeding  HTN/grade 1 diastolic dysfunction: -Suboptimal blood pressure readings in context of possible evolving sepsis therefore Norvasc discontinued on 8/25 -Echo this admission with an EF of 65 to 70% with right 1 diastolic physiology, also noted with mild MR and AR -Was +9 L  after IV fluid rehydration therefore was given one-time dose of IV Lasix over the weekend   HLD: - Hold statin for now; AST has decreased to 51 and ALT normal at 38 total bili slightly elevated at 1.3      Data Reviewed: Basic Metabolic Panel: Recent Labs  Lab 06/29/21 0140 06/30/21 0343 07/01/21 0431 07/02/21 0400 07/04/21 0400  NA 141 136 136 137 136  K 3.4* 3.8 4.2 3.6 3.5  CL 111 108 106 106 105  CO2 22 21* 21* 22 21*  GLUCOSE 186* 170* 121* 74 196*  BUN 42* 36* 38* 34* 29*  CREATININE 1.56* 1.58* 1.53* 1.56* 1.39*  CALCIUM 7.8* 7.4* 7.6* 8.0* 8.2*   Liver Function Tests: Recent Labs  Lab 06/28/21 0639 07/02/21 0400 07/04/21 0400  AST  --  26 23  ALT  --  13 7  ALKPHOS  --  132* 124  BILITOT  --  1.7* 1.3*  PROT  --  5.5* 5.4*  ALBUMIN 1.2* 1.0* <1.0*   No results for input(s): LIPASE, AMYLASE in the last 168 hours. No results for input(s): AMMONIA in the last 168 hours. CBC: Recent Labs  Lab 06/28/21 0639 06/28/21 1758 06/30/21 0343 07/01/21 0431 07/02/21 0400 07/04/21 0400  WBC 7.4  --  7.1 7.3 7.3 5.8  HGB 6.7* 8.2* 6.8* 8.7* 8.9* 7.6*  HCT 21.4* 25.0* 21.9* 26.7* 27.9* 24.4*  MCV 103.9*  --  97.8 94.3 94.6 97.2  PLT 109*  --  114* 117* 146*  147*   Cardiac Enzymes: No results for input(s): CKTOTAL, CKMB, CKMBINDEX, TROPONINI in the last 168 hours. BNP (last 3 results) No results for input(s): BNP in the last 8760 hours.  ProBNP (last 3 results) No results for input(s): PROBNP in the last 8760 hours.  CBG: Recent Labs  Lab 07/03/21 0756 07/03/21 1638 07/03/21 1957 07/03/21 2344 07/04/21 0411  GLUCAP 69* 131* 144* 144* 173*    Recent Results (from the past 240 hour(s))  Culture, blood (Routine X 2) w Reflex to ID Panel     Status: None (Preliminary result)   Collection Time: 06/30/21  8:40 AM   Specimen: BLOOD  Result Value Ref Range Status   Specimen Description BLOOD BLOOD RIGHT HAND  Final   Special Requests AEROBIC BOTTLE ONLY  Blood Culture adequate volume  Final   Culture   Final    NO GROWTH 3 DAYS Performed at [facility], [address] [place], [place] [address]    Report Status PENDING  Incomplete  Culture, blood (Routine X 2) w Reflex to ID Panel     Status: None (Preliminary result)   Collection Time: 06/30/21  8:40 AM   Specimen: BLOOD  Result Value Ref Range Status   Specimen Description BLOOD RIGHT ANTECUBITAL  Final   Special Requests   Final    BOTTLES DRAWN AEROBIC AND ANAEROBIC Blood Culture results may not be optimal due to an inadequate volume of blood received in culture bottles   Culture   Final    NO GROWTH 3 DAYS Performed at [facility], [address], [place], [place] [address]    Report Status PENDING  Incomplete      Studies: IR GASTROSTOMY TUBE MOD SED  Result Date: 07/02/2021 INDICATION: 75 year old female with dysphagia. She requires placement of a percutaneous gastrostomy tube. EXAM: Fluoroscopically guided placement of percutaneous pull-through gastrostomy tube Interventional Radiologist:  Sterling Big, MD MEDICATIONS: In patient currently receiving Ancef every 12 hours. No additional antibiotic prophylaxis administered. ANESTHESIA/SEDATION: Versed 1.5 mg IV; Fentanyl 75 mcg IV Moderate Sedation Time:  13 minutes The patient was continuously monitored during the procedure by the interventional radiology nurse under my direct supervision. CONTRAST:  55mL OMNIPAQUE IOHEXOL 240 MG/ML SOLN FLUOROSCOPY TIME:  Fluoroscopy Time: 2 minutes 12 seconds (13 mGy). COMPLICATIONS: None immediate. PROCEDURE: Informed written consent was obtained from the patient after a thorough discussion of the procedural risks, benefits and alternatives. All questions were addressed. Maximal Sterile Barrier Technique was utilized including caps, mask, sterile gowns, sterile gloves, sterile drape, hand hygiene and skin antiseptic. A timeout was performed prior to the initiation of the  procedure. Maximal barrier sterile technique utilized including caps, mask, sterile gowns, sterile gloves, large sterile drape, hand hygiene, and chlorhexadine skin prep. An angled catheter was advanced over a wire under fluoroscopic guidance through the nose, down the esophagus and into the body of the stomach. The stomach was then insufflated with several 100 ml of air. Fluoroscopy confirmed location of the gastric bubble, as well as inferior displacement of the barium stained colon. Under direct fluoroscopic guidance, a single T-tack was placed, and the anterior gastric wall drawn up against the anterior abdominal wall. Percutaneous access was then obtained into the mid gastric body with an 18 gauge sheath needle. Aspiration of air, and injection of contrast material under fluoroscopy confirmed needle placement. An Amplatz wire was advanced in the gastric body and the access needle exchanged for a 9-French vascular sheath. A snare  device was advanced through the vascular sheath and an Amplatz wire advanced through the angled catheter. The Amplatz wire was successfully snared and this was pulled up through the esophagus and out the mouth. A 20-French Burnell Blanks MIC-PEG tube was then connected to the snare and pulled through the mouth, down the esophagus, into the stomach and out to the anterior abdominal wall. Hand injection of contrast material confirmed intragastric location. The T-tack retention suture was then cut. The pull through peg tube was then secured with the external bumper and capped. The patient will be observed for several hours with the newly placed tube on low wall suction to evaluate for any post procedure complication. The patient tolerated the procedure well, there is no immediate complication. IMPRESSION: Successful placement of a 20 French pull through gastrostomy tube. Electronically Signed   By: Malachy Moan M.D.   On: 07/02/2021 16:01    Scheduled Meds:  sodium chloride    Intravenous Once   acetaminophen (TYLENOL) oral liquid 160 mg/5 mL  650 mg Per Tube Q6H   allopurinol  300 mg Per Tube Daily   amLODipine  10 mg Per Tube Daily   Chlorhexidine Gluconate Cloth  6 each Topical Daily   diclofenac Sodium  4 g Topical QID   doxazosin  4 mg Per Tube Daily   folic acid  1 mg Per Tube Daily   free water  125 mL Per Tube Q4H   furosemide  60 mg Intravenous Once   metaxalone  400 mg Per Tube TID   multivitamin  15 mL Per Tube Daily   potassium chloride  20 mEq Per Tube Daily   rifampin  300 mg Oral Q12H   thiamine  100 mg Per Tube Daily   Continuous Infusions:  albumin human      ceFAZolin (ANCEF) IV 2 g (07/03/21 2122)   cefTRIAXone (ROCEPHIN)  IV     dextrose 50 mL/hr at 07/03/21 6728   feeding supplement (OSMOLITE 1.2 CAL) 1,000 mL (07/03/21 1424)    Principal Problem:   Abscess in epidural space of cervical spine Active Problems:   Fall   Alcoholic hepatitis   Altered mental status   Acute delirium   Sepsis (HCC)   Malnutrition of moderate degree   Consultants: Neurology Psychiatry Infectious disease Neurosurgery  Procedures: Echocardiogram Cervical discectomy and fusion on 06/16/2021  Antibiotics: Acyclovir on 8/5 and 8/6 Ampicillin 8/5 through 8/8 Ceftriaxone 8/5 through 8/9 Vancomycin 8/6 through 8/8 Nafcillin 8/9 >> Rifampin 8/12 >>   Time spent: 45 minutes    Junious Silk ANP  [facility] 7 am - 330 pm/M-F for direct patient care and secure chat Please refer to Amion for contact info 23  days

## 2021-07-05 LAB — COMPREHENSIVE METABOLIC PANEL
ALT: 6 U/L (ref 0–44)
AST: 17 U/L (ref 15–41)
Albumin: 1.8 g/dL — ABNORMAL LOW (ref 3.5–5.0)
Alkaline Phosphatase: 146 U/L — ABNORMAL HIGH (ref 38–126)
Anion gap: 8 (ref 5–15)
BUN: 27 mg/dL — ABNORMAL HIGH (ref 8–23)
CO2: 22 mmol/L (ref 22–32)
Calcium: 8.4 mg/dL — ABNORMAL LOW (ref 8.9–10.3)
Chloride: 104 mmol/L (ref 98–111)
Creatinine, Ser: 1.17 mg/dL — ABNORMAL HIGH (ref 0.44–1.00)
GFR, Estimated: 49 mL/min — ABNORMAL LOW (ref >60)
Glucose, Bld: 142 mg/dL — ABNORMAL HIGH (ref 70–99)
Potassium: 3.8 mmol/L (ref 3.5–5.1)
Sodium: 134 mmol/L — ABNORMAL LOW (ref 135–145)
Total Bilirubin: 1.1 mg/dL (ref 0.3–1.2)
Total Protein: 5.9 g/dL — ABNORMAL LOW (ref 6.5–8.1)

## 2021-07-05 LAB — CBC WITH DIFFERENTIAL/PLATELET
Abs Immature Granulocytes: 0.04 10*3/uL (ref 0.00–0.07)
Basophils Absolute: 0 10*3/uL (ref 0.0–0.1)
Basophils Relative: 0 %
Eosinophils Absolute: 0.5 10*3/uL (ref 0.0–0.5)
Eosinophils Relative: 8 %
HCT: 22.3 % — ABNORMAL LOW (ref 36.0–46.0)
Hemoglobin: 7.1 g/dL — ABNORMAL LOW (ref 12.0–15.0)
Immature Granulocytes: 1 %
Lymphocytes Relative: 10 %
Lymphs Abs: 0.6 10*3/uL — ABNORMAL LOW (ref 0.7–4.0)
MCH: 31.3 pg (ref 26.0–34.0)
MCHC: 31.8 g/dL (ref 30.0–36.0)
MCV: 98.2 fL (ref 80.0–100.0)
Monocytes Absolute: 0.4 10*3/uL (ref 0.1–1.0)
Monocytes Relative: 8 %
Neutro Abs: 4.3 10*3/uL (ref 1.7–7.7)
Neutrophils Relative %: 73 %
Platelets: 151 10*3/uL (ref 150–400)
RBC: 2.27 MIL/uL — ABNORMAL LOW (ref 3.87–5.11)
RDW: 18.2 % — ABNORMAL HIGH (ref 11.5–15.5)
WBC: 5.8 10*3/uL (ref 4.0–10.5)
nRBC: 0 % (ref 0.0–0.2)

## 2021-07-05 LAB — LACTIC ACID, PLASMA: Lactic Acid, Venous: 1.4 mmol/L (ref 0.5–1.9)

## 2021-07-05 LAB — CULTURE, BLOOD (ROUTINE X 2)
Culture: NO GROWTH
Culture: NO GROWTH
Special Requests: ADEQUATE

## 2021-07-05 LAB — GLUCOSE, CAPILLARY
Glucose-Capillary: 139 mg/dL — ABNORMAL HIGH (ref 70–99)
Glucose-Capillary: 123 mg/dL — ABNORMAL HIGH (ref 70–99)
Glucose-Capillary: 120 mg/dL — ABNORMAL HIGH (ref 70–99)
Glucose-Capillary: 136 mg/dL — ABNORMAL HIGH (ref 70–99)
Glucose-Capillary: 95 mg/dL (ref 70–99)
Glucose-Capillary: 115 mg/dL — ABNORMAL HIGH (ref 70–99)

## 2021-07-05 LAB — URINE CULTURE

## 2021-07-05 LAB — PROCALCITONIN: Procalcitonin: 0.26 ng/mL

## 2021-07-05 LAB — PHOSPHORUS: Phosphorus: 3.7 mg/dL (ref 2.5–4.6)

## 2021-07-05 LAB — HEMOGLOBIN AND HEMATOCRIT, BLOOD
HCT: 25.5 % — ABNORMAL LOW (ref 36.0–46.0)
Hemoglobin: 8.3 g/dL — ABNORMAL LOW (ref 12.0–15.0)

## 2021-07-05 LAB — PREPARE RBC (CROSSMATCH)

## 2021-07-05 LAB — C-REACTIVE PROTEIN: CRP: 18.4 mg/dL — ABNORMAL HIGH (ref <1.0)

## 2021-07-05 MED ORDER — SODIUM CHLORIDE 0.9% IV SOLUTION: Freq: Once | INTRAVENOUS | Status: DC

## 2021-07-05 MED ORDER — MIDODRINE HCL 5 MG PO TABS
5.0000 mg | ORAL_TABLET | Freq: Three times a day (TID) | ORAL | Status: DC
  Administered 2021-07-05 – 2021-07-10 (×11): 5 mg
  Filled 2021-07-05 (×12): qty 1

## 2021-07-05 MED ORDER — SODIUM CHLORIDE 0.9 % IV SOLN: INTRAVENOUS | Status: AC

## 2021-07-05 NOTE — Plan of Care (Signed)
  Problem: Clinical Measurements: Goal: Ability to maintain clinical measurements within normal limits will improve Outcome: Progressing Goal: Will remain free from infection Outcome: Progressing Goal: Respiratory complications will improve Outcome: Progressing Goal: Cardiovascular complication will be avoided Outcome: Progressing   

## 2021-07-05 NOTE — Progress Notes (Signed)
Occupational Therapy Treatment Patient Details Name: Kristy [name] MRN: [id number] DOB: July 15, 1946 Today's Date: 07/05/2021    History of present illness 75yo female presenting to [facility] ED on 8/1 after a fall on 7/29. Pt also fell on 8/2 while in the ED. Pt found to be hypokalemic upon admission. 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 Pt is now s/p drainage of abscess and C5-6 ACDF on 06/16/21. Also noted R SI septic arthritis.  PMH: HTN, polycthemia, alcohol use.   OT comments  Pt continues to present with decreased activity tolerance due to pain. Pt with edema at BUEs and significant pain. Facilitating PROM of BUE wrist, elbows, and shoulders. Facilitating retrograde massage. Continue to recommend dc to SNF and will continue to follow acutely as admitted.    Follow Up Recommendations  SNF;Supervision/Assistance - 24 hour    Equipment Recommendations  Other (comment) (Defer to next venue)    Recommendations for Other Services Other (comment)    Precautions / Restrictions Precautions Precautions: Fall;Cervical Precaution Booklet Issued: No Precaution Comments: PEG Restrictions Weight Bearing Restrictions: No       Mobility Bed Mobility               General bed mobility comments: Deferred due to pain and focused on ROM    Transfers                 General transfer comment: Deferred    Balance                                           ADL either performed or assessed with clinical judgement   ADL Overall ADL's : Needs assistance/impaired     Grooming: Total assistance;Bed level Grooming Details (indicate cue type and reason): Total A for washing her face.                               General ADL Comments: Focused session on BUE ROM and retrograde massage.     Vision       Perception     Praxis      Cognition Arousal/Alertness:  Awake/alert Behavior During Therapy: Flat affect Overall Cognitive Status: Impaired/Different from baseline Area of Impairment: Attention;Memory;Following commands;Safety/judgement;Awareness;Problem solving;Orientation                 Orientation Level: Disoriented to;Place;Situation;Time Current Attention Level: Focused;Sustained (short sustained; during conversation) Memory: Decreased short-term memory;Decreased recall of precautions Following Commands: Follows one step commands inconsistently;Follows one step commands with increased time Safety/Judgement: Decreased awareness of safety;Decreased awareness of deficits Awareness: Intellectual Problem Solving: Slow processing;Difficulty sequencing;Requires verbal cues General Comments: Pt very fearful of pain and movement. Pt responding well to music and singing of hymns. At times, pt singing along with therapist. Pt not oriented and during conversation stating tangiental information. Pt requesting her snuff mutiple times.        Exercises Exercises: General Upper Extremity;Other exercises General Exercises - Upper Extremity Shoulder ABduction: PROM;15 reps;Supine;Both Shoulder ADduction: PROM;Both;15 reps;Supine Elbow Flexion: PROM;Both;15 reps;Supine Elbow Extension: PROM;Both;15 reps;Supine Wrist Flexion: PROM;Both;15 reps;Supine Wrist Extension: PROM;Both;15 reps;Supine Other Exercises Other Exercises: Retrograde massage to BUEs Other Exercises: Repositioning head; pt tendency for R lateral flexion   Shoulder Instructions       General Comments VSS  Pertinent Vitals/ Pain       Pain Assessment: Faces Faces Pain Scale: Hurts little more Pain Location: moaning/crying with minimal movement Pain Descriptors / Indicators: Grimacing Pain Intervention(s): Monitored during session  Home Living                                          Prior Functioning/Environment              Frequency  Min  2X/week        Progress Toward Goals  OT Goals(current goals can now be found in the care plan section)  Progress towards OT goals: Not progressing toward goals - comment  Acute Rehab OT Goals Patient Stated Goal: per family for pt to get better OT Goal Formulation: Patient unable to participate in goal setting Time For Goal Achievement: 07/09/21 Potential to Achieve Goals: Fair ADL Goals Pt Will Perform Eating: with mod assist;with adaptive utensils;sitting Pt Will Perform Grooming: with max assist;bed level Pt Will Perform Upper Body Bathing: with max assist;bed level Pt Will Transfer to Toilet: with +2 assist;with mod assist;bedside commode;ambulating Additional ADL Goal #1: Pt will follow 1 step commands with 50% accuracy in nondistracting environment Additional ADL Goal #2: Pt will tolerate sitting EOB with Mac A +2 x 5 min Additional ADL Goal #3: Pt will complete hand to mouth pattern with BUE with mod A in preparation for self feeding  Plan Discharge plan remains appropriate    Co-evaluation                 AM-PAC OT "6 Clicks" Daily Activity     Outcome Measure   Help from another person eating meals?: Total Help from another person taking care of personal grooming?: Total Help from another person toileting, which includes using toliet, bedpan, or urinal?: Total Help from another person bathing (including washing, rinsing, drying)?: Total Help from another person to put on and taking off regular upper body clothing?: Total Help from another person to put on and taking off regular lower body clothing?: Total 6 Click Score: 6    End of Session    OT Visit Diagnosis: Unsteadiness on feet (R26.81);Other abnormalities of gait and mobility (R26.89);Repeated falls (R29.6);Muscle weakness (generalized) (M62.81);Other symptoms and signs involving cognitive function;Pain   Activity Tolerance Patient limited by pain   Patient Left in bed;with bed alarm set (elevated  BUE)   Nurse Communication Mobility status        Time: 7579-7282 OT Time Calculation (min): 30 min  Charges: OT General Charges $OT Visit: 1 Visit OT Treatments $Therapeutic Exercise: 23-37 mins  [place], OTR/L Acute Rehab Pager: [phone] Office: Home 07/05/2021, 12:17 PM

## 2021-07-05 NOTE — Progress Notes (Signed)
BP 99/49, MD made aware, see new orders.

## 2021-07-05 NOTE — Progress Notes (Addendum)
[facility] PROGRESS NOTE  Kristy [name] JIR:[id number] DOB: [birth year] DOA: 06/10/2021 PCP: Darrow Bussing, MD  Status: Remains inpatient appropriate because:Persistent severe electrolyte disturbances, Altered mental status, Unsafe d/c plan, IV treatments appropriate due to intensity of illness or inability to take PO, and Inpatient level of care appropriate due to severity of illness  Dispo: The patient is from: Home              Anticipated d/c is to: SNF              Patient currently is not medically stable to d/c.   Difficult to place patient No    Level of care: Progressive  Code Status: Full Family Communication: Updated daughter [name] on 8/25 regarding new UTI and infectious related acute delirium.  On 8/26 defy daughter of need for consent for transfusion. DVT prophylaxis: SCDs COVID vaccination status: Pfizer 02/06/2020 and 02/28/2020; COVID booster been 07/02/2021  Foley catheter: Yes POA: No Date inserted: 06/16/21-dc'd 8/23 but replaced on 8/24 secondary to recurrent urinary retention Still needed?/Rationale: Yes-recurrent urinary retention-needs eventual bladder training prior to removal  HPI: 75 y.o. female with a history of alcohol use who presented to the ED for frequent falls, generalized weakness, and confusion. She was found to be afebrile, hypertensive, and without hypoxia. Also had hypokalemia (K 2.6), SCr 1.88 (above baseline ~1.5), LFT elevation (AST 576, ALT 305). Ammonia normal. EtOH, salicylate, tylenol levels negative. CT head and cervical spine were reassuring, though the patient was unable to rise to seated position from laying prompting consultation for admission. The patient was given potassium supplementation, started on CIWA protocol, and admitted with note made of continued AMS. She has remained weak and confused, though on 8/5 became less interactive, retaining urine, WBC noted to be rising. Blood and urine cultures ordered, ceftriaxone given with  bacteriuria and urinary retention. MR brain has not been done yet. Neurology consulted with worsening mental status and nuchal rigidity. LP under fluoro requested, empirically treating meningitis/encephalitis.   Subjective: Patient more awake and alert and getting closer to baseline today.  Less confused.  Answers most questions appropriately and was aware she did not know the year  Objective: Vitals:   07/05/21 0400 07/05/21 0408  BP:  (!) 118/58  Pulse:  (!) 101  Resp:  19  Temp: (!) 97.4 F (36.3 C) 97.8 F (36.6 C)  SpO2:  100%    Intake/Output Summary (Last 24 hours) at 07/05/2021 0735 Last data filed at 07/05/2021 0552 Gross per 24 hour  Intake 5689.69 ml  Output 1751 ml  Net 3938.69 ml   Filed Weights   06/25/21 0336 06/28/21 0500 07/04/21 0500  Weight: 78.8 kg 85.7 kg 87.5 kg    Exam: Constitutional: NAD, awake and more alert and closer to baseline today Pulmonary: Anterior lung sounds clear and slightly coarse to auscultation bilaterally, mild tachypnea with respiratory rate 28.  Patient is 100% Cardiovascular: Sinus rhythm with frequent unifocal PVCs, normotensive but on the lower end.  Continues with mild tachycardia.  Focal edema of upper extremities slightly improved.  Normotensive Abdomen: Soft and focally tender over lower abdomen/suprapubic region with normoactive bowel sounds with more tenderness in LLQ.  PEG tube in place.  LBM 8/21 Genitourinary: Foley catheter in place draining yellow urine to bedside bag Musculoskeletal: Still has some preferential positioning of head and neck to the right but overall after receiving medications is able to move head to the midline.  Still has contracture has pain with AROM Neurologic:  CN 2-12 grossly intact. Sensation intact, Strength 2/5 x all 4 extremities.  Psychiatric: Alert and oriented times name and place.  Not to year or situation.  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; meningitis ruled out UTI (POA);  - Neuro, ID, and Neurosurgery consulted -TTE: No vegetation/endocarditis  -S/P I/D on  06/16/21 -drainage large epidural abscess -cultures positive for MSSA -Completed 2 weeks of nafcillin and is now on Rocephin until 10/2 -Continue rifampin for duration of treatment -Continue scheduled low-dose Skelaxin and Tylenol for neck spasm and contracture  -Rocephin used in favor of Ancef given new UTI in context of recent indwelling catheter  Sepsis physiology  2/2 >=100,000 COLONIES/mL KLEBSIELLA ORNITHINOLYTICA /suspected CAUTI/possible associated pyelonephritis/sepsis ruled out -Patient presented with pan sensitive staph UTI which was treated effectively -Patient required Foley catheter due to persistent AKI as well as at least one episode of acute urinary retention -Initiated empiric Rocephin IV prior to preliminary culture demonstrating Klebsiella-discussed with pharmacy who recommended continuing Rocephin until sensitivities return -Blood cultures NGTD, sepsis physiology stable.  Lactic acid normalized.  CRP trending down.  Continues with mild tachypnea and tachycardia but is otherwise hemodynamically stable-mentation has improved; renal function remains stable with excellent urinary output after being given albumin on 8/25 -Continues to have abdominal pain with palpation worse on LLQ.  Of note on 8/18 patient had CT abdomen and pelvis prior to G-tube placement unremarkable findings.  Although tender in abdomen exam not consistent with acute abdomen.  Acute delirium -Secondary to UTI and evolving sepsis physiology -As of 8/26 appears to be improving/resolving  Severe hypoalbuminemia -Albumin <1.0 w/ normal phosphorus -8/25 given 4 runs of 25% albumin every 6 hours with significant improvement in urine output and renal function  with BUN 27 and creatinine 1.17 albumin up to 1.8. -Continue tube feeding as ordered  Acute metabolic encephalopathy 2/2 sepsis physiology at presentation and persistent hypernatremia/associated profound dysphagia - Neurology consulted - bedside LP unable to be performed  - MRI remarkable for osteo/epidural abscess as above -PEG tube placed 8/23 and will resume tube feedings as prior -Hypernatremia has resolved.  Presenting infection under control.  New issue of UTI with mild delirium now improving with treatment.  Recurrent urinary retention -Foley catheter discontinued on 8/23 and required I/O catheterization with large volume retained urine x 2 after removal -Unable to use Flomax since it will clog feeding tube so have changed to Cardura -Continue Foley cath -Likely can begin bladder training by clamping catheter at intervals in the next few days  Epistaxis Resolved -ENT/Dr. Ezzard Standing evaluated patient and cauterized bleeding vessel in right anterior nostril with silver nitrate, aspirin and heparin stopped. -No further bleeding   Macrocytosis /anemia of critical illness as well as associated chronic kidney disease -Hgb nadir 6.7 after recent epistaxis she was given 1 unit of PRBCs with improvement of hemoglobin to 8.2.  Hemoglobin down to 6.8 on 8/21 and was given an additional unit of PRBCs  -8/26 hemoglobin has drifted down to 7.1 with attending recommending transfusion of PRBCs.  Daughter contacted for consent.  Thrombocytopenia  -platelets 140,000 on 8/9 and several days later decreased to just above 100,000 -8/17 platelets 105,000 and as of 8/25 platelets 147,000 -Aspirin and heparin discontinued in context of epistaxis and it is unlikely we will be able to resume anticoagulants for DVT prophylaxis  AKI on stage IIIb CKD, stabilizing:  -  Baseline renal function: BUN 24 and creatinine 1.5 with GFR slightly greater than 30 - 8/26 BUN 27 and creatinine 1.17   UTI, POA, resolved:  -  Covered by above abx - treatment completed   Ambulatory dysfunction with fall at home, severe weakness:  - Secondary to above - PT/OT currently recommending SNF rehabilitation once acute infectious process under better control -    Alcoholic hepatitis/regular alcohol use:  - Based on AST predominance of LFT elevations. Hepatitis panel nonreactive.  Family confirms that patient drank 2 beers per day prior to admission - No indication for prednisolone at this time. Continue trending. - AST/ALT downtrending - bilirubin increased marginally  -Per family patient basically drank 1-2 peers daily with alcohol level negative in the ER -Will require eventual cessation counseling once mentation at baseline -Continue thiamine, folate and multiple vitamin   Hypokalemia: -8/16 potassium was low at 3.1 and after repletion has increased to 3.5 -8/22 potassium 4.2 but on 8/23 potassium down to 3.6.  Patient also having unifocal PVCs.  Continue low-dose daily potassium per tube and follow labs.  Goal for potassium should be >/= 4.0   Severe protein caloric malnutrition  -Continue tube feeds and free water flushes via cortrack -Repeat albumin level on 8/19 was 1.2-remain down to 1.0 as of 8/23 -Status post PEG tube placement Nutrition Problem: Moderate Malnutrition Etiology: chronic illness Signs/Symptoms: moderate muscle depletion, moderate fat depletion, mild muscle depletion, mild fat depletion Interventions: Prostat, Tube feeding  HTN/grade 1 diastolic dysfunction: -Suboptimal blood pressure readings in context of possible evolving sepsis therefore Norvasc discontinued on 8/25 -Echo this admission with an EF of 65 to 70% with right 1 diastolic physiology, also noted with mild MR and AR -Was +9 L after IV fluid rehydration therefore was given one-time dose of IV Lasix over the weekend   HLD: - Hold statin for now; AST has decreased to 51 and ALT normal at 38 total bili slightly elevated at  1.3      Data Reviewed: Basic Metabolic Panel: Recent Labs  Lab 06/30/21 0343 07/01/21 0431 07/02/21 0400 07/04/21 0400 07/05/21 0352  NA 136 136 137 136 134*  K 3.8 4.2 3.6 3.5 3.8  CL 108 106 106 105 104  CO2 21* 21* 22 21* 22  GLUCOSE 170* 121* 74 196* 142*  BUN 36* 38* 34* 29* 27*  CREATININE 1.58* 1.53* 1.56* 1.39* 1.17*  CALCIUM 7.4* 7.6* 8.0* 8.2* 8.4*  PHOS  --   --   --  4.5 3.7   Liver Function Tests: Recent Labs  Lab 07/02/21 0400 07/04/21 0400 07/05/21 0352  AST 26 23 17   ALT 13 7 6   ALKPHOS 132* 124 146*  BILITOT 1.7* 1.3* 1.1  PROT 5.5* 5.4* 5.9*  ALBUMIN 1.0* <1.0* 1.8*   No results for input(s): LIPASE, AMYLASE in the last 168 hours. No results for input(s): AMMONIA in the last 168 hours. CBC: Recent Labs  Lab 06/30/21 0343 07/01/21 0431 07/02/21 0400 07/04/21 0400 07/05/21 0352  WBC 7.1 7.3 7.3 5.8 5.8  NEUTROABS  --   --   --   --  4.3  HGB 6.8* 8.7* 8.9* 7.6* 7.1*  HCT 21.9* 26.7* 27.9* 24.4* 22.3*  MCV 97.8 94.3 94.6 97.2 98.2  PLT 114* 117* 146* 147* 151   Cardiac Enzymes: No results for input(s): CKTOTAL, CKMB, CKMBINDEX, TROPONINI in the last 168 hours. BNP (last 3 results) No results for input(s): BNP in the last 8760 hours.  ProBNP (last 3 results)  No results for input(s): PROBNP in the last 8760 hours.  CBG: Recent Labs  Lab 07/04/21 1201 07/04/21 1546 07/04/21 1931 07/04/21 2341 07/05/21 0408  GLUCAP 179* 144* 191* 145* 139*    Recent Results (from the past 240 hour(s))  Culture, blood (Routine X 2) w Reflex to ID Panel     Status: None (Preliminary result)   Collection Time: 06/30/21  8:40 AM   Specimen: BLOOD  Result Value Ref Range Status   Specimen Description BLOOD BLOOD RIGHT HAND  Final   Special Requests AEROBIC BOTTLE ONLY Blood Culture adequate volume  Final   Culture   Final    NO GROWTH 4 DAYS Performed at [facility], [address] [place], [place] [address]    Report Status PENDING   Incomplete  Culture, blood (Routine X 2) w Reflex to ID Panel     Status: None (Preliminary result)   Collection Time: 06/30/21  8:40 AM   Specimen: BLOOD  Result Value Ref Range Status   Specimen Description BLOOD RIGHT ANTECUBITAL  Final   Special Requests   Final    BOTTLES DRAWN AEROBIC AND ANAEROBIC Blood Culture results may not be optimal due to an inadequate volume of blood received in culture bottles   Culture   Final    NO GROWTH 4 DAYS Performed at [facility], [address], [place], [place] [address]    Report Status PENDING  Incomplete  Urine Culture     Status: Abnormal (Preliminary result)   Collection Time: 07/03/21  8:46 AM   Specimen: Urine, Catheterized  Result Value Ref Range Status   Specimen Description URINE, CATHETERIZED  Final   Special Requests NONE  Final   Culture (A)  Final    >=100,000 COLONIES/mL KLEBSIELLA ORNITHINOLYTICA SUSCEPTIBILITIES TO FOLLOW Performed at [facility], [address], [place], [place] [address]    Report Status PENDING  Incomplete      Studies: No results found.  Scheduled Meds:  sodium chloride   Intravenous Once   acetaminophen (TYLENOL) oral liquid 160 mg/5 mL  650 mg Per Tube Q6H   allopurinol  300 mg Per Tube Daily   Chlorhexidine Gluconate Cloth  6 each Topical Daily   diclofenac Sodium  4 g Topical QID   doxazosin  4 mg Per Tube Daily   folic acid  1 mg Per Tube Daily   free water  125 mL Per Tube Q4H   metaxalone  400 mg Per Tube TID   multivitamin  15 mL Per Tube Daily   potassium chloride  20 mEq Per Tube Daily   rifampin  300 mg Oral Q12H   thiamine  100 mg Per Tube Daily   Continuous Infusions:  cefTRIAXone (ROCEPHIN)  IV Stopped (07/04/21 1104)   dextrose 50 mL/hr at 07/04/21 1600   feeding supplement (OSMOLITE 1.2 CAL) 1,000 mL (07/04/21 1832)    Principal Problem:   Abscess in epidural space of cervical spine Active Problems:   Fall   Alcoholic hepatitis   Altered mental  status   Acute delirium   Sepsis (HCC)   Malnutrition of moderate degree   Consultants: Neurology Psychiatry Infectious disease Neurosurgery  Procedures: Echocardiogram Cervical discectomy and fusion on 06/16/2021  Antibiotics: Acyclovir on 8/5 and 8/6 Ampicillin 8/5 through 8/8 Ceftriaxone 8/5 through 8/9 Vancomycin 8/6 through 8/8 Nafcillin 8/9 >> Rifampin 8/12 >>   Time spent: 45 minutes    Junious Silk ANP  [facility]  7 am - 330 pm/M-F for direct patient care and secure chat Please refer to Amion for contact info 24  days

## 2021-07-05 NOTE — Plan of Care (Signed)
  Problem: Clinical Measurements: Goal: Respiratory complications will improve Outcome: Progressing   Problem: Nutrition: Goal: Adequate nutrition will be maintained Outcome: Progressing   Problem: Elimination: Goal: Will not experience complications related to bowel motility Outcome: Progressing   

## 2021-07-06 ENCOUNTER — Inpatient Hospital Stay (HOSPITAL_COMMUNITY): Payer: Medicare Other

## 2021-07-06 DIAGNOSIS — M7989 Other specified soft tissue disorders: Secondary | ICD-10-CM

## 2021-07-06 DIAGNOSIS — R400 Somnolence: Secondary | ICD-10-CM

## 2021-07-06 LAB — COMPREHENSIVE METABOLIC PANEL
ALT: 7 U/L (ref 0–44)
AST: 18 U/L (ref 15–41)
Albumin: 1.5 g/dL — ABNORMAL LOW (ref 3.5–5.0)
Alkaline Phosphatase: 174 U/L — ABNORMAL HIGH (ref 38–126)
Anion gap: 9 (ref 5–15)
BUN: 28 mg/dL — ABNORMAL HIGH (ref 8–23)
CO2: 20 mmol/L — ABNORMAL LOW (ref 22–32)
Calcium: 8.3 mg/dL — ABNORMAL LOW (ref 8.9–10.3)
Chloride: 107 mmol/L (ref 98–111)
Creatinine, Ser: 1.14 mg/dL — ABNORMAL HIGH (ref 0.44–1.00)
GFR, Estimated: 50 mL/min — ABNORMAL LOW (ref >60)
Glucose, Bld: 101 mg/dL — ABNORMAL HIGH (ref 70–99)
Potassium: 4.4 mmol/L (ref 3.5–5.1)
Sodium: 136 mmol/L (ref 135–145)
Total Bilirubin: 1.3 mg/dL — ABNORMAL HIGH (ref 0.3–1.2)
Total Protein: 5.8 g/dL — ABNORMAL LOW (ref 6.5–8.1)

## 2021-07-06 LAB — TYPE AND SCREEN
ABO/RH(D): B NEG
Antibody Screen: NEGATIVE
Unit division: 0

## 2021-07-06 LAB — CBC WITH DIFFERENTIAL/PLATELET
Abs Immature Granulocytes: 0.05 10*3/uL (ref 0.00–0.07)
Basophils Absolute: 0 10*3/uL (ref 0.0–0.1)
Basophils Relative: 0 %
Eosinophils Absolute: 0.5 10*3/uL (ref 0.0–0.5)
Eosinophils Relative: 8 %
HCT: 26 % — ABNORMAL LOW (ref 36.0–46.0)
Hemoglobin: 8.2 g/dL — ABNORMAL LOW (ref 12.0–15.0)
Immature Granulocytes: 1 %
Lymphocytes Relative: 10 %
Lymphs Abs: 0.6 10*3/uL — ABNORMAL LOW (ref 0.7–4.0)
MCH: 30.7 pg (ref 26.0–34.0)
MCHC: 31.5 g/dL (ref 30.0–36.0)
MCV: 97.4 fL (ref 80.0–100.0)
Monocytes Absolute: 0.6 10*3/uL (ref 0.1–1.0)
Monocytes Relative: 9 %
Neutro Abs: 4.5 10*3/uL (ref 1.7–7.7)
Neutrophils Relative %: 72 %
Platelets: 180 10*3/uL (ref 150–400)
RBC: 2.67 MIL/uL — ABNORMAL LOW (ref 3.87–5.11)
RDW: 17.9 % — ABNORMAL HIGH (ref 11.5–15.5)
WBC: 6.3 10*3/uL (ref 4.0–10.5)
nRBC: 0 % (ref 0.0–0.2)

## 2021-07-06 LAB — GLUCOSE, CAPILLARY
Glucose-Capillary: 98 mg/dL (ref 70–99)
Glucose-Capillary: 117 mg/dL — ABNORMAL HIGH (ref 70–99)
Glucose-Capillary: 122 mg/dL — ABNORMAL HIGH (ref 70–99)
Glucose-Capillary: 99 mg/dL (ref 70–99)
Glucose-Capillary: 102 mg/dL — ABNORMAL HIGH (ref 70–99)
Glucose-Capillary: 81 mg/dL (ref 70–99)

## 2021-07-06 LAB — BPAM RBC
Blood Product Expiration Date: 202209292359
ISSUE DATE / TIME: 202208261111
Unit Type and Rh: 1700

## 2021-07-06 LAB — PROCALCITONIN: Procalcitonin: 0.22 ng/mL

## 2021-07-06 LAB — PHOSPHORUS: Phosphorus: 3.3 mg/dL (ref 2.5–4.6)

## 2021-07-06 LAB — MAGNESIUM: Magnesium: 1.6 mg/dL — ABNORMAL LOW (ref 1.7–2.4)

## 2021-07-06 LAB — BRAIN NATRIURETIC PEPTIDE: B Natriuretic Peptide: 452.3 pg/mL — ABNORMAL HIGH (ref 0.0–100.0)

## 2021-07-06 MED ORDER — HEPARIN SODIUM (PORCINE) 5000 UNIT/ML IJ SOLN
5000.0000 [IU] | Freq: Three times a day (TID) | INTRAMUSCULAR | Status: DC
  Administered 2021-07-06 – 2021-07-11 (×15): 5000 [IU] via SUBCUTANEOUS
  Filled 2021-07-06 (×15): qty 1

## 2021-07-06 MED ORDER — NIACIN 500 MG PO TABS
500.0000 mg | ORAL_TABLET | Freq: Every day | ORAL | Status: DC
  Administered 2021-07-06 – 2021-07-10 (×5): 500 mg
  Filled 2021-07-06 (×6): qty 1

## 2021-07-06 MED ORDER — OXYCODONE HCL 5 MG PO TABS
5.0000 mg | ORAL_TABLET | Freq: Four times a day (QID) | ORAL | Status: DC | PRN
  Administered 2021-07-07 – 2021-07-08 (×4): 5 mg via ORAL
  Filled 2021-07-06 (×4): qty 1

## 2021-07-06 NOTE — Progress Notes (Signed)
VASCULAR LAB    Bilateral lower extremity venous duplex has been performed.  See CV proc for preliminary results.   Lira Stephen, RVT 07/06/2021, 5:20 PM

## 2021-07-06 NOTE — Progress Notes (Addendum)
PROGRESS NOTE    Kristy [name]  HEN:[id number] DOB: [birth year] DOA: 06/10/2021 PCP: Darrow Bussing, MD    Chief Complaint  Patient presents with   Fall    Brief Narrative:   75 y.o. female with a history of alcohol use who presented to the ED for frequent falls, generalized weakness, and confusion. She was found to be afebrile, hypertensive, and without hypoxia. Also had hypokalemia (K 2.6), SCr 1.88 (above baseline ~1.5), LFT elevation (AST 576, ALT 305). Ammonia normal. EtOH, salicylate, tylenol levels negative. CT head and cervical spine were reassuring, though the patient was unable to rise to seated position from laying prompting consultation for admission. The patient was given potassium supplementation, started on CIWA protocol, and admitted with note made of continued AMS. She has remained weak and confused, though on 8/5 became less interactive, retaining urine, WBC noted to be rising. Blood and urine cultures ordered, ceftriaxone given with bacteriuria and urinary retention. MR brain has not been done yet. Neurology consulted with worsening mental status and nuchal rigidity. LP under fluoro requested, empirically treating meningitis/encephalitis.   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 - Rifampin 600 mg BID to be continued throughout the treatment as she can tolerate for 8 weeks; End Dates: 8/21 for nafcillin; 10/02 for cefazolin  -Continue scheduled Tylenol for neck spasm and contracture  -Rocephin used in favor of Ancef given new UTI in context of recent indwelling catheter  -Patient continues to have intermittent fever, tachycardia, blood culture remains with no growth to date, continue with Rocephin for now, if remains with persistent fever over next 24/48 hours we will do CT chest/abdomen/pelvis, as well we will check Dopplers to rule out DVT given she is tachycardic, febrile.   Acute delirium -Secondary to UTI and evolving sepsis physiology -Mentation significantly improved with Severe hypoalbuminemia -Albumin <1.0 w/ normal phosphorus -8/25 given 4 runs of 25% albumin every 6 hours with significant improvement in urine output and renal function with BUN 27 and creatinine 1.17 albumin up to 1.8. -Continue tube feeding as ordered   Acute metabolic encephalopathy 2/2 sepsis physiology at presentation and persistent hypernatremia/associated profound dysphagia - Neurology consulted - bedside LP unable to be performed  - MRI remarkable for osteo/epidural abscess as above -PEG tube placed 8/23 and will resume tube feedings as prior -Hypernatremia has resolved.  Presenting infection under control.  New issue of UTI with mild delirium now improving with treatment.   Recurrent urinary retention -Foley catheter discontinued on 8/23 and required I/O catheterization with large volume retained urine x 2 after removal -Unable to use Flomax since it will clog feeding tube so have changed to Cardura -Continue Foley cath -Likely can begin bladder training by clamping catheter at intervals in the next few days   Epistaxis Resolved -ENT/Dr. Ezzard Standing evaluated patient and cauterized bleeding vessel  in right anterior nostril with silver  nitrate, aspirin and heparin stopped. -No further bleeding    Macrocytosis /anemia of critical illness as well as associated chronic kidney disease -Hgb nadir 6.7 after recent epistaxis she was given 1 unit of PRBCs with improvement of hemoglobin to 8.2.  Hemoglobin down to 6.8 on 8/21 and was given an additional unit of PRBCs  -8/26 hemoglobin has drifted down to 7.1 with attending recommending transfusion of PRBCs.  Daughter contacted for consent.   Thrombocytopenia  -platelets 140,000 on 8/9 and several days later decreased to just above 100,000 -8/17 platelets 105,000 and as of 8/25 platelets 147,000 -Aspirin and heparin discontinued in context of epistaxis and it is unlikely we will be able to resume anticoagulants for DVT prophylaxis   AKI on stage IIIb CKD, stabilizing:  -Baseline renal function: BUN 24 and creatinine 1.5 with GFR slightly greater than 30 - 8/26 BUN 27 and creatinine 1.17   UTI, POA, resolved:  - Covered by above abx - treatment completed   Ambulatory dysfunction with fall at home, severe weakness:  - Secondary to above - PT/OT currently recommending SNF rehabilitation once acute infectious process under better control -    Alcoholic hepatitis/regular alcohol use:  - Based on AST predominance of LFT elevations. Hepatitis panel nonreactive.  Family confirms that patient drank 2 beers per day prior to admission - No indication for prednisolone at this time. Continue trending. - AST/ALT downtrending - bilirubin increased marginally  -Per family patient basically drank 1-2 peers daily with alcohol level negative in the ER -Will require eventual cessation counseling once mentation at baseline -Continue thiamine, folate and multiple vitamin   Hypokalemia: -8/16 potassium was low at 3.1 and after repletion has increased to 3.5 -8/22 potassium 4.2 but on 8/23 potassium down to 3.6.  Patient also having unifocal PVCs.  Continue low-dose daily potassium per tube and  follow labs.  Goal for potassium should be >/= 4.0   Severe protein caloric malnutrition  -Continue tube feeds and free water flushes via cortrack -Repeat albumin level on 8/19 was 1.2-remain down to 1.0 as of 8/23 -Status post PEG tube placement Nutrition Problem: Moderate Malnutrition Etiology: chronic illness Signs/Symptoms: moderate muscle depletion, moderate fat depletion, mild muscle depletion, mild fat depletion Interventions: Prostat, Tube feeding   HTN/grade 1 diastolic dysfunction: -Suboptimal blood pressure readings in context of possible evolving sepsis therefore Norvasc discontinued on 8/25 -Echo this admission with an EF of 65 to 70% with right 1 diastolic physiology, also noted with mild MR and AR -Was +9 L after IV fluid rehydration therefore was given one-time dose of IV Lasix over the weekend   HLD: - Hold statin for now; AST has decreased to 51 and ALT normal at 38 total bili slightly elevated at 1.3     Will start niacin given her dermatitis. Patient with frequent PVCs, will check magnesium level as potassium within normal limits.  DVT prophylaxis: [place] heaprin will be resumed today. Code Status: Full Family Communication: None at beside Disposition:   Status is: Inpatient  Remains inpatient appropriate because:Inpatient level of care appropriate due to severity of illness  Dispo: The patient is from: Home              Anticipated d/c is to: SNF              Patient currently is not medically stable to d/c.   Difficult to place patient No        Subjective:  Patient remains with intermittent fever, most recent 100.7 this morning,  Objective: Vitals:   07/06/21 0306 07/06/21 0400 07/06/21 0732 07/06/21 1157  BP: (!) 105/55  124/67 118/62  Pulse:  (!) 119 (!) 118 (!) 108  Resp:  20 19 20   Temp: 98.5 F (36.9 C) 98.1 F (36.7 C) (!) 100.7 F (38.2 C) (!) 100.5 F (38.1 C)  TempSrc: Oral Oral Axillary Axillary  SpO2:   99% 99%  Weight:       Height:        Intake/Output Summary (Last 24 hours) at 07/06/2021 1326 Last data filed at 07/06/2021 0300 Gross per 24 hour  Intake 2355.25 ml  Output 950 ml  Net 1405.25 ml   Filed Weights   06/25/21 0336 06/28/21 0500 07/04/21 0500  Weight: 78.8 kg 85.7 kg 87.5 kg    Examination:  Awake Alert, significantly deconditioned, ill-appearing, frail , answering questions appropriately . Symmetrical Chest wall movement, Good air movement bilaterally, CTAB RRR,No Gallops,Rubs or new Murmurs, No Parasternal Heave +ve B.Sounds, Abd Soft, No tenderness, No rebound - guarding or rigidity. Remains with significant generalized weakness, unable to twitch her fingers and toes, minimally moving her arms and legs, she is with significant upper extremity anasarca, chronic lower extremity skin changes.    Data Reviewed: I have personally reviewed following labs and imaging studies  CBC: Recent Labs  Lab 07/01/21 0431 07/02/21 0400 07/04/21 0400 07/05/21 0352 07/05/21 1930 07/06/21 0217  WBC 7.3 7.3 5.8 5.8  --  6.3  NEUTROABS  --   --   --  4.3  --  4.5  HGB 8.7* 8.9* 7.6* 7.1* 8.3* 8.2*  HCT 26.7* 27.9* 24.4* 22.3* 25.5* 26.0*  MCV 94.3 94.6 97.2 98.2  --  97.4  PLT 117* 146* 147* 151  --  180    Basic Metabolic Panel: Recent Labs  Lab 07/01/21 0431 07/02/21 0400 07/04/21 0400 07/05/21 0352 07/06/21 0217  NA 136 137 136 134* 136  K 4.2 3.6 3.5 3.8 4.4  CL 106 106 105 104 107  CO2 21* 22 21* 22 20*  GLUCOSE 121* 74 196* 142* 101*  BUN 38* 34* 29* 27* 28*  CREATININE 1.53* 1.56* 1.39* 1.17* 1.14*  CALCIUM 7.6* 8.0* 8.2* 8.4* 8.3*  PHOS  --   --  4.5 3.7 3.3    GFR: Estimated Creatinine Clearance: 47.5 mL/min (A) (by C-G formula based on SCr of 1.14 mg/dL (H)).  Liver Function Tests: Recent Labs  Lab 07/02/21 0400 07/04/21 0400 07/05/21 0352 07/06/21 0217  AST 26 23 17 18   ALT 13 7 6 7   ALKPHOS 132* 124 146* 174*  BILITOT 1.7* 1.3* 1.1 1.3*  PROT 5.5* 5.4*  5.9* 5.8*  ALBUMIN 1.0* <1.0* 1.8* 1.5*    CBG: Recent Labs  Lab 07/05/21 1903 07/05/21 2257 07/06/21 0303 07/06/21 0731 07/06/21 1156  GLUCAP 95 115* 98 117* 122*     Recent Results (from the past 240 hour(s))  Culture, blood (Routine X 2) w Reflex to ID Panel     Status: None   Collection Time: 06/30/21  8:40 AM   Specimen: BLOOD  Result Value Ref Range Status   Specimen Description BLOOD BLOOD RIGHT HAND  Final   Special Requests AEROBIC BOTTLE ONLY Blood Culture adequate volume  Final   Culture   Final    NO GROWTH 5 DAYS Performed at [facility], [address] [place], [place] [address]    Report Status 07/05/2021 FINAL  Final  Culture, blood (Routine X 2) w Reflex to ID Panel     Status: None   Collection Time: 06/30/21  8:40 AM   Specimen: BLOOD  Result Value Ref Range Status   Specimen Description BLOOD RIGHT ANTECUBITAL  Final   Special Requests   Final    BOTTLES DRAWN AEROBIC AND ANAEROBIC Blood Culture results may not be optimal due to an inadequate volume of blood received in culture bottles   Culture   Final    NO GROWTH 5 DAYS Performed at [facility], [address], [place], [place] [address]    Report Status 07/05/2021 FINAL  Final  Urine Culture     Status: Abnormal   Collection Time: 07/03/21  8:46 AM   Specimen: Urine, Catheterized  Result Value Ref Range Status   Specimen Description URINE, CATHETERIZED  Final   Special Requests   Final    NONE Performed at [facility], [address] [place], [place] [address]    Culture MULTIPLE SPECIES PRESENT, SUGGEST RECOLLECTION (A)  Final   Report Status 07/05/2021 FINAL  Final  Culture, blood (Routine X 2) w Reflex to ID Panel     Status: None (Preliminary result)   Collection Time: 07/04/21  8:03 AM   Specimen: BLOOD RIGHT HAND  Result Value Ref Range Status   Specimen Description BLOOD RIGHT HAND  Final   Special Requests   Final    BOTTLES DRAWN AEROBIC ONLY Blood  Culture results may not be optimal due to an inadequate volume of blood received in culture bottles   Culture   Final    NO GROWTH 2 DAYS Performed at [facility], [address], [place], [place] [address]    Report Status PENDING  Incomplete  Culture, blood (Routine X 2) w Reflex to ID Panel     Status: None (Preliminary result)   Collection Time: 07/04/21  8:04 AM   Specimen: BLOOD RIGHT HAND  Result Value Ref Range Status   Specimen Description BLOOD RIGHT HAND  Final   Special Requests   Final    BOTTLES DRAWN AEROBIC ONLY Blood Culture results may not be optimal due to an inadequate volume of blood received in culture bottles   Culture   Final    NO GROWTH 2 DAYS Performed at [facility], [address], [place], [place] [address]    Report Status PENDING  Incomplete         Radiology Studies: No results found.      Scheduled Meds:  sodium chloride   Intravenous Once   sodium chloride   Intravenous Once   acetaminophen (TYLENOL) oral liquid 160 mg/5 mL  650 mg Per Tube Q6H   allopurinol  300 mg Per Tube Daily   Chlorhexidine Gluconate Cloth  6 each Topical Daily   diclofenac Sodium  4 g Topical QID   folic acid  1 mg Per Tube Daily   free water  125 mL Per Tube Q4H   heparin injection (subcutaneous)  5,000 Units Subcutaneous Q8H   midodrine  5 mg Per Tube TID WC   multivitamin  15 mL Per Tube Daily   potassium chloride  20 mEq Per Tube Daily   rifampin  300 mg Oral Q12H   thiamine  100 mg Per Tube Daily   Continuous Infusions:  cefTRIAXone (ROCEPHIN)  IV 2 g (07/06/21 0931)   feeding supplement (OSMOLITE 1.2 CAL) 1,000 mL (07/06/21 0357)  LOS: 25 days       Huey Bienenstock, MD [facility]   To contact the attending provider between 7A-7P or the covering provider during after hours 7P-7A, please log into the web site [email or web address] and access using universal [place] password for that web site. If you do not have the  password, please call the hospital operator.  07/06/2021, 1:26 PM

## 2021-07-07 DIAGNOSIS — A419 Sepsis, unspecified organism: Secondary | ICD-10-CM

## 2021-07-07 LAB — COMPREHENSIVE METABOLIC PANEL
ALT: 9 U/L (ref 0–44)
AST: 18 U/L (ref 15–41)
Albumin: 1.4 g/dL — ABNORMAL LOW (ref 3.5–5.0)
Alkaline Phosphatase: 173 U/L — ABNORMAL HIGH (ref 38–126)
Anion gap: 11 (ref 5–15)
BUN: 27 mg/dL — ABNORMAL HIGH (ref 8–23)
CO2: 20 mmol/L — ABNORMAL LOW (ref 22–32)
Calcium: 8.7 mg/dL — ABNORMAL LOW (ref 8.9–10.3)
Chloride: 106 mmol/L (ref 98–111)
Creatinine, Ser: 1.02 mg/dL — ABNORMAL HIGH (ref 0.44–1.00)
GFR, Estimated: 57 mL/min — ABNORMAL LOW (ref >60)
Glucose, Bld: 111 mg/dL — ABNORMAL HIGH (ref 70–99)
Potassium: 4.2 mmol/L (ref 3.5–5.1)
Sodium: 137 mmol/L (ref 135–145)
Total Bilirubin: 1 mg/dL (ref 0.3–1.2)
Total Protein: 6 g/dL — ABNORMAL LOW (ref 6.5–8.1)

## 2021-07-07 LAB — GLUCOSE, CAPILLARY
Glucose-Capillary: 105 mg/dL — ABNORMAL HIGH (ref 70–99)
Glucose-Capillary: 105 mg/dL — ABNORMAL HIGH (ref 70–99)
Glucose-Capillary: 92 mg/dL (ref 70–99)
Glucose-Capillary: 100 mg/dL — ABNORMAL HIGH (ref 70–99)
Glucose-Capillary: 121 mg/dL — ABNORMAL HIGH (ref 70–99)
Glucose-Capillary: 105 mg/dL — ABNORMAL HIGH (ref 70–99)
Glucose-Capillary: 90 mg/dL (ref 70–99)

## 2021-07-07 LAB — CBC WITH DIFFERENTIAL/PLATELET
Abs Immature Granulocytes: 0.12 10*3/uL — ABNORMAL HIGH (ref 0.00–0.07)
Basophils Absolute: 0 10*3/uL (ref 0.0–0.1)
Basophils Relative: 0 %
Eosinophils Absolute: 0.5 10*3/uL (ref 0.0–0.5)
Eosinophils Relative: 6 %
HCT: 27.7 % — ABNORMAL LOW (ref 36.0–46.0)
Hemoglobin: 8.7 g/dL — ABNORMAL LOW (ref 12.0–15.0)
Immature Granulocytes: 1 %
Lymphocytes Relative: 9 %
Lymphs Abs: 0.8 10*3/uL (ref 0.7–4.0)
MCH: 31 pg (ref 26.0–34.0)
MCHC: 31.4 g/dL (ref 30.0–36.0)
MCV: 98.6 fL (ref 80.0–100.0)
Monocytes Absolute: 0.7 10*3/uL (ref 0.1–1.0)
Monocytes Relative: 8 %
Neutro Abs: 6.2 10*3/uL (ref 1.7–7.7)
Neutrophils Relative %: 76 %
Platelets: 211 10*3/uL (ref 150–400)
RBC: 2.81 MIL/uL — ABNORMAL LOW (ref 3.87–5.11)
RDW: 17.9 % — ABNORMAL HIGH (ref 11.5–15.5)
WBC: 8.4 10*3/uL (ref 4.0–10.5)
nRBC: 0 % (ref 0.0–0.2)

## 2021-07-07 LAB — PHOSPHORUS: Phosphorus: 3.8 mg/dL (ref 2.5–4.6)

## 2021-07-07 LAB — PROCALCITONIN: Procalcitonin: 0.21 ng/mL

## 2021-07-07 MED ORDER — MAGNESIUM SULFATE 2 GM/50ML IV SOLN
2.0000 g | Freq: Once | INTRAVENOUS | Status: AC
  Administered 2021-07-07: 2 g via INTRAVENOUS
  Filled 2021-07-07: qty 50

## 2021-07-07 NOTE — Plan of Care (Signed)
  Problem: Nutrition: Goal: Adequate nutrition will be maintained Outcome: Progressing   Problem: Coping: Goal: Level of anxiety will decrease Outcome: Progressing   Problem: Elimination: Goal: Will not experience complications related to bowel motility Outcome: Progressing   

## 2021-07-07 NOTE — Plan of Care (Signed)

## 2021-07-07 NOTE — Progress Notes (Signed)
PROGRESS NOTE    Kristy [name]  ZOX:[id number] DOB: 05/24/1946 DOA: 06/10/2021 PCP: Darrow BussingKoirala, Dibas, MD    Chief Complaint  Patient presents with   Fall    Brief Narrative:   75 y.o. female with a history of alcohol use who presented to the ED for frequent falls, generalized weakness, and confusion. She was found to be afebrile, hypertensive, and without hypoxia. Also had hypokalemia (K 2.6), SCr 1.88 (above baseline ~1.5), LFT elevation (AST 576, ALT 305). Ammonia normal. EtOH, salicylate, tylenol levels negative. CT head and cervical spine were reassuring, though the patient was unable to rise to seated position from laying prompting consultation for admission. The patient was given potassium supplementation, started on CIWA protocol, and admitted with note made of continued AMS. She has remained weak and confused, though on 8/5 became less interactive, retaining urine, WBC noted to be rising. Blood and urine cultures ordered, ceftriaxone given with bacteriuria and urinary retention. MR brain has not been done yet. Neurology consulted with worsening mental status and nuchal rigidity. LP under fluoro requested, empirically treating meningitis/encephalitis.  LP was negative for meningitis/encephalitis.  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 - Rifampin 600 mg BID to be continued throughout the treatment as she can tolerate for 8 weeks; End Dates: 8/21 for nafcillin; 10/02 for cefazolin  -Continue scheduled Tylenol for neck spasm and contracture  -Rocephin used in favor of Ancef given new UTI in context of recent indwelling catheter -Patient is afebrile over last 24 hours, will continue to monitor closely, likely will change back to cefazolin in a.m. if she remains afebrile. -Venous Dopplers negative for DVT, it was obtained given her fever and tachycardia.  PVCs/bigeminy on telemetry - potassium within normal limit, magnesium on the lower side at 1.6, repleted check in a.m.  Acute delirium -Secondary to UTI and evolving sepsis physiology -Mentation significantly improved with Severe hypoalbuminemia -Albumin <1.0 w/ normal phosphorus -8/25 given 4 runs of 25% albumin every 6 hours with significant improvement in urine output and renal function with BUN 27 and creatinine 1.17 albumin up to 1.8. -Continue tube feeding as ordered   Acute metabolic encephalopathy 2/2 sepsis physiology at presentation and persistent hypernatremia/associated profound dysphagia - Neurology consulted - bedside LP unable to be performed  - MRI remarkable for osteo/epidural abscess as above -PEG tube placed 8/23 and will resume tube feedings as prior -Hypernatremia has resolved.  Presenting infection under control.  New issue of UTI with mild delirium now improving with treatment.   Recurrent urinary retention -Foley catheter discontinued on 8/23 and required I/O catheterization with large volume retained urine x 2 after removal -Unable to use Flomax since it will clog feeding tube so have changed to Cardura -Continue Foley cath -Likely can begin bladder training by clamping catheter at intervals in the next few days  Epistaxis Resolved -ENT/Dr. Ezzard Standing  evaluated patient and cauterized bleeding vessel in right anterior nostril with silver nitrate, aspirin and heparin stopped. -No further bleeding    Macrocytosis /anemia of critical illness as well as associated chronic kidney disease -Hgb nadir 6.7 after recent epistaxis she was given 1 unit of PRBCs with improvement of hemoglobin to 8.2.  Hemoglobin down to 6.8 on 8/21 and was given an additional unit of PRBCs  -8/26 hemoglobin has drifted down to 7.1 with attending recommending transfusion of PRBCs.  Daughter contacted for consent.   Thrombocytopenia  -platelets 140,000 on 8/9 and several days later decreased to just above 100,000 -8/17 platelets 105,000 and as of 8/25 platelets 147,000 -Aspirin and heparin discontinued in context of epistaxis and it is unlikely we will be able to resume anticoagulants for DVT prophylaxis   AKI on stage IIIb CKD, stabilizing:  -Baseline renal function: BUN 24 and creatinine 1.5 with GFR slightly greater than 30   UTI, POA, resolved:  - Covered by above abx - treatment completed   Ambulatory dysfunction with fall at home, severe weakness:  - Secondary to above - PT/OT currently recommending SNF rehabilitation once acute infectious process under better control -    Alcoholic hepatitis/regular alcohol use:  - Based on AST predominance of LFT elevations. Hepatitis panel nonreactive.  Family confirms that patient drank 2 beers per day prior to admission - No indication for prednisolone at this time. Continue trending. - AST/ALT downtrending - bilirubin increased marginally  -Per family patient basically drank 1-2 peers daily with alcohol level negative in the ER -Will require eventual cessation counseling once mentation at baseline -Continue thiamine, folate and multiple vitamin   Hypokalemia: -8/16 potassium was low at 3.1 and after repletion has increased to 3.5 -8/22 potassium 4.2 but on 8/23 potassium down to 3.6.  Patient also having unifocal PVCs.   Continue low-dose daily potassium per tube and follow labs.  Goal for potassium should be >/= 4.0   Severe protein caloric malnutrition  -Continue tube feeds and free water flushes via cortrack -Repeat albumin level on 8/19 was 1.2-remain down to 1.0 as of 8/23 -Status post PEG tube placement Nutrition Problem: Moderate Malnutrition Etiology: chronic illness Signs/Symptoms: moderate muscle depletion, moderate fat depletion, mild muscle depletion, mild fat depletion Interventions: Prostat, Tube feeding   HTN/grade 1 diastolic dysfunction: -Suboptimal blood pressure readings in context of possible evolving sepsis therefore Norvasc discontinued on 8/25 -Echo this admission with an EF of 65 to 70% with right 1 diastolic physiology, also noted with mild MR and AR -Was +9 L after IV fluid rehydration therefore was given one-time dose of IV Lasix over the weekend   HLD: - Hold statin for now; AST has decreased to 51 and ALT normal at 38 total bili slightly elevated at 1.3     Will start niacin given her dermatitis.   DVT prophylaxis: Homer heaprin will be resumed today. Code Status: Full Family Communication: None at beside Disposition:   Status is: Inpatient  Remains inpatient appropriate because:Inpatient level of care appropriate due to severity of illness  Dispo: The patient is from: Home              Anticipated d/c is to: SNF              Patient currently is not medically stable to d/c.   Difficult to place patient No        Subjective:  Is afebrile over last 24 hours, no significant events  as discussed with staff.  Objective: Vitals:   07/07/21 0400 07/07/21 0500 07/07/21 0800 07/07/21 1200  BP:   119/66 (!) 127/98  Pulse:   (!) 117 (!) 116  Resp:   (!) 22 (!) 23  Temp: 98.2 F (36.8 C)  98.6 F (37 C) 98.4 F (36.9 C)  TempSrc: Axillary  Oral Oral  SpO2:   100% 99%  Weight:  89 kg    Height:        Intake/Output Summary (Last 24 hours) at 07/07/2021  1519 Last data filed at 07/07/2021 1100 Gross per 24 hour  Intake 1050 ml  Output 3800 ml  Net -2750 ml   Filed Weights   06/28/21 0500 07/04/21 0500 07/07/21 0500  Weight: 85.7 kg 87.5 kg 89 kg    Examination:  Awake Alert, significantly deconditioned, ill-appearing, frail , answering questions appropriately . Symmetrical Chest wall movement, Good air movement bilaterally, CTAB RRR,No Gallops,Rubs or new Murmurs, No Parasternal Heave +ve B.Sounds, Abd Soft, No tenderness, No rebound - guarding or rigidity. Remains with significant generalized weakness, able to twitch her fingers and toes, minimally moving her arms and legs, she is with significant upper extremity anasarca, chronic lower extremity skin changes.    Data Reviewed: I have personally reviewed following labs and imaging studies  CBC: Recent Labs  Lab 07/02/21 0400 07/04/21 0400 07/05/21 0352 07/05/21 1930 07/06/21 0217 07/07/21 0258  WBC 7.3 5.8 5.8  --  6.3 8.4  NEUTROABS  --   --  4.3  --  4.5 6.2  HGB 8.9* 7.6* 7.1* 8.3* 8.2* 8.7*  HCT 27.9* 24.4* 22.3* 25.5* 26.0* 27.7*  MCV 94.6 97.2 98.2  --  97.4 98.6  PLT 146* 147* 151  --  180 211    Basic Metabolic Panel: Recent Labs  Lab 07/02/21 0400 07/04/21 0400 07/05/21 0352 07/06/21 0217 07/07/21 0258  NA 137 136 134* 136 137  K 3.6 3.5 3.8 4.4 4.2  CL 106 105 104 107 106  CO2 22 21* 22 20* 20*  GLUCOSE 74 196* 142* 101* 111*  BUN 34* 29* 27* 28* 27*  CREATININE 1.56* 1.39* 1.17* 1.14* 1.02*  CALCIUM 8.0* 8.2* 8.4* 8.3* 8.7*  MG  --   --   --  1.6*  --   PHOS  --  4.5 3.7 3.3 3.8    GFR: Estimated Creatinine Clearance: 53.6 mL/min (A) (by C-G formula based on SCr of 1.02 mg/dL (H)).  Liver Function Tests: Recent Labs  Lab 07/02/21 0400 07/04/21 0400 07/05/21 0352 07/06/21 0217 07/07/21 0258  AST 26 23 17 18 18   ALT 13 7 6 7 9   ALKPHOS 132* 124 146* 174* 173*  BILITOT 1.7* 1.3* 1.1 1.3* 1.0  PROT 5.5* 5.4* 5.9* 5.8* 6.0*  ALBUMIN  1.0* <1.0* 1.8* 1.5* 1.4*    CBG: Recent Labs  Lab 07/06/21 2256 07/07/21 0302 07/07/21 0419 07/07/21 0908 07/07/21 1208  GLUCAP 81 105* 105* 92 100*     Recent Results (from the past 240 hour(s))  Culture, blood (Routine X 2) w Reflex to ID Panel     Status: None   Collection Time: 06/30/21  8:40 AM   Specimen: BLOOD  Result Value Ref Range Status   Specimen Description BLOOD BLOOD RIGHT HAND  Final   Special Requests AEROBIC BOTTLE ONLY Blood Culture adequate volume  Final   Culture   Final    NO GROWTH 5 DAYS Performed at [facility], [address], [place],  [place] [address]    Report Status 07/05/2021 FINAL  Final  Culture, blood (Routine X 2) w Reflex to ID Panel     Status: None   Collection Time: 06/30/21  8:40 AM   Specimen: BLOOD  Result Value Ref Range Status   Specimen Description BLOOD RIGHT ANTECUBITAL  Final   Special Requests   Final    BOTTLES DRAWN AEROBIC AND ANAEROBIC Blood Culture results may not be optimal due to an inadequate volume of blood received in culture bottles   Culture   Final    NO GROWTH 5 DAYS Performed at [facility], [address] [place], [place] [address]    Report Status 07/05/2021 FINAL  Final  Urine Culture     Status: Abnormal   Collection Time: 07/03/21  8:46 AM   Specimen: Urine, Catheterized  Result Value Ref Range Status   Specimen Description URINE, CATHETERIZED  Final   Special Requests   Final    NONE Performed at [facility], [address] [place], [place] [address]    Culture MULTIPLE SPECIES PRESENT, SUGGEST RECOLLECTION (A)  Final   Report Status 07/05/2021 FINAL  Final  Culture, blood (Routine X 2) w Reflex to ID Panel     Status: None (Preliminary result)   Collection Time: 07/04/21  8:03 AM   Specimen: BLOOD RIGHT HAND  Result Value Ref Range Status   Specimen Description BLOOD RIGHT HAND  Final   Special Requests   Final    BOTTLES DRAWN AEROBIC ONLY Blood Culture results may  not be optimal due to an inadequate volume of blood received in culture bottles   Culture   Final    NO GROWTH 3 DAYS Performed at [facility], [address] [place], [place] [address]    Report Status PENDING  Incomplete  Culture, blood (Routine X 2) w Reflex to ID Panel     Status: None (Preliminary result)   Collection Time: 07/04/21  8:04 AM   Specimen: BLOOD RIGHT HAND  Result Value Ref Range Status   Specimen Description BLOOD RIGHT HAND  Final   Special Requests   Final    BOTTLES DRAWN AEROBIC ONLY Blood Culture results may not be optimal due to an inadequate volume of blood received in culture bottles   Culture   Final    NO GROWTH 3 DAYS Performed at [facility], [address], [place], [place] [address]    Report Status PENDING  Incomplete         Radiology Studies: VAS [place] LOWER EXTREMITY VENOUS (DVT)  Result Date: 07/07/2021  Lower Venous DVT Study Patient Name:  SWEDEN LESURE  Date of Exam:   07/06/2021 Medical Rec #: [id number]      Accession #:    [id number] Date of Birth: August 04, 1946       Patient Gender: F Patient Age:   75 years Exam Location:  [facility] Procedure:      VAS [place] LOWER EXTREMITY VENOUS (DVT) Referring Phys: De Libman --------------------------------------------------------------------------------  Indications: Swelling.  Limitations: Altered mental status, inability to bend legs. Comparison Study: No prior study on file Performing Technologist: Sherren Kerns RVS  Examination Guidelines: A complete evaluation includes B-mode imaging, spectral Doppler, color Doppler, and power Doppler as needed of all accessible portions of each vessel. Bilateral testing is considered an integral part of a complete examination. Limited examinations for reoccurring indications may be performed as noted. The reflux portion  of the exam is performed with the patient in reverse Trendelenburg.   +---------+---------------+---------+-----------+----------+-------------------+ RIGHT    CompressibilityPhasicitySpontaneityPropertiesThrombus Aging      +---------+---------------+---------+-----------+----------+-------------------+ CFV      Full                                                             +---------+---------------+---------+-----------+----------+-------------------+ SFJ      Full                                                             +---------+---------------+---------+-----------+----------+-------------------+ FV Prox  Full                                                             +---------+---------------+---------+-----------+----------+-------------------+ FV Mid   Full                                                             +---------+---------------+---------+-----------+----------+-------------------+ FV DistalFull                                                             +---------+---------------+---------+-----------+----------+-------------------+ PFV                                                   Not well visualized +---------+---------------+---------+-----------+----------+-------------------+ POP                     Yes      Yes                  patent by color and                                                       Doppler             +---------+---------------+---------+-----------+----------+-------------------+ PTV      Full                                                             +---------+---------------+---------+-----------+----------+-------------------+ PERO  Full                                                             +---------+---------------+---------+-----------+----------+-------------------+   +---------+---------------+---------+-----------+----------+-------------------+ LEFT     CompressibilityPhasicitySpontaneityPropertiesThrombus Aging       +---------+---------------+---------+-----------+----------+-------------------+ CFV      Full           Yes      Yes                                      +---------+---------------+---------+-----------+----------+-------------------+ SFJ      Full                                                             +---------+---------------+---------+-----------+----------+-------------------+ FV Prox  Full                                                             +---------+---------------+---------+-----------+----------+-------------------+ FV Mid   Full                                                             +---------+---------------+---------+-----------+----------+-------------------+ FV DistalFull                                                             +---------+---------------+---------+-----------+----------+-------------------+ PFV                                                   Not well visualized +---------+---------------+---------+-----------+----------+-------------------+ POP                                                   patent by color and                                                       Doppler             +---------+---------------+---------+-----------+----------+-------------------+ PTV      Full                                                             +---------+---------------+---------+-----------+----------+-------------------+  PERO     Full                                                             +---------+---------------+---------+-----------+----------+-------------------+     Summary: RIGHT: - There is no evidence of deep vein thrombosis in the lower extremity. However, portions of this examination were limited- see technologist comments above.  - Ultrasound characteristics of enlarged lymph nodes are noted in the groin.  LEFT: - There is no evidence of deep vein thrombosis in the lower  extremity. However, portions of this examination were limited- see technologist comments above.  *See table(s) above for measurements and observations. Electronically signed by Sherald Hess MD on 07/07/2021 at 2:26:33 PM.    Final         Scheduled Meds:  sodium chloride   Intravenous Once   sodium chloride   Intravenous Once   acetaminophen (TYLENOL) oral liquid 160 mg/5 mL  650 mg Per Tube Q6H   allopurinol  300 mg Per Tube Daily   Chlorhexidine Gluconate Cloth  6 each Topical Daily   diclofenac Sodium  4 g Topical QID   folic acid  1 mg Per Tube Daily   free water  125 mL Per Tube Q4H   heparin injection (subcutaneous)  5,000 Units Subcutaneous Q8H   midodrine  5 mg Per Tube TID WC   multivitamin  15 mL Per Tube Daily   niacin  500 mg Per Tube QHS   rifampin  300 mg Oral Q12H   thiamine  100 mg Per Tube Daily   Continuous Infusions:  cefTRIAXone (ROCEPHIN)  IV 2 g (07/07/21 0911)   feeding supplement (OSMOLITE 1.2 CAL) 1,000 mL (07/06/21 0357)     LOS: 26 days       Huey Bienenstock, MD [facility]   To contact the attending provider between 7A-7P or the covering provider during after hours 7P-7A, please log into the web site [email or web address] and access using universal [place] password for that web site. If you do not have the password, please call the hospital operator.  07/07/2021, 3:19 PM

## 2021-07-08 LAB — COMPREHENSIVE METABOLIC PANEL
ALT: 8 U/L (ref 0–44)
AST: 21 U/L (ref 15–41)
Albumin: 1.5 g/dL — ABNORMAL LOW (ref 3.5–5.0)
Alkaline Phosphatase: 165 U/L — ABNORMAL HIGH (ref 38–126)
Anion gap: 10 (ref 5–15)
BUN: 27 mg/dL — ABNORMAL HIGH (ref 8–23)
CO2: 20 mmol/L — ABNORMAL LOW (ref 22–32)
Calcium: 8.8 mg/dL — ABNORMAL LOW (ref 8.9–10.3)
Chloride: 105 mmol/L (ref 98–111)
Creatinine, Ser: 1.03 mg/dL — ABNORMAL HIGH (ref 0.44–1.00)
GFR, Estimated: 57 mL/min — ABNORMAL LOW (ref >60)
Glucose, Bld: 107 mg/dL — ABNORMAL HIGH (ref 70–99)
Potassium: 4.4 mmol/L (ref 3.5–5.1)
Sodium: 135 mmol/L (ref 135–145)
Total Bilirubin: 1 mg/dL (ref 0.3–1.2)
Total Protein: 6.4 g/dL — ABNORMAL LOW (ref 6.5–8.1)

## 2021-07-08 LAB — CBC WITH DIFFERENTIAL/PLATELET
Abs Immature Granulocytes: 0.17 10*3/uL — ABNORMAL HIGH (ref 0.00–0.07)
Basophils Absolute: 0 10*3/uL (ref 0.0–0.1)
Basophils Relative: 0 %
Eosinophils Absolute: 0.5 10*3/uL (ref 0.0–0.5)
Eosinophils Relative: 6 %
HCT: 27.9 % — ABNORMAL LOW (ref 36.0–46.0)
Hemoglobin: 9 g/dL — ABNORMAL LOW (ref 12.0–15.0)
Immature Granulocytes: 2 %
Lymphocytes Relative: 11 %
Lymphs Abs: 0.9 10*3/uL (ref 0.7–4.0)
MCH: 31.6 pg (ref 26.0–34.0)
MCHC: 32.3 g/dL (ref 30.0–36.0)
MCV: 97.9 fL (ref 80.0–100.0)
Monocytes Absolute: 0.7 10*3/uL (ref 0.1–1.0)
Monocytes Relative: 8 %
Neutro Abs: 6 10*3/uL (ref 1.7–7.7)
Neutrophils Relative %: 73 %
Platelets: 228 10*3/uL (ref 150–400)
RBC: 2.85 MIL/uL — ABNORMAL LOW (ref 3.87–5.11)
RDW: 17.7 % — ABNORMAL HIGH (ref 11.5–15.5)
WBC: 8.3 10*3/uL (ref 4.0–10.5)
nRBC: 0 % (ref 0.0–0.2)

## 2021-07-08 LAB — GLUCOSE, CAPILLARY
Glucose-Capillary: 113 mg/dL — ABNORMAL HIGH (ref 70–99)
Glucose-Capillary: 105 mg/dL — ABNORMAL HIGH (ref 70–99)
Glucose-Capillary: 116 mg/dL — ABNORMAL HIGH (ref 70–99)
Glucose-Capillary: 101 mg/dL — ABNORMAL HIGH (ref 70–99)
Glucose-Capillary: 101 mg/dL — ABNORMAL HIGH (ref 70–99)

## 2021-07-08 LAB — PHOSPHORUS: Phosphorus: 3.9 mg/dL (ref 2.5–4.6)

## 2021-07-08 LAB — MAGNESIUM: Magnesium: 1.8 mg/dL (ref 1.7–2.4)

## 2021-07-08 MED ORDER — CEFAZOLIN SODIUM-DEXTROSE 2-4 GM/100ML-% IV SOLN
2.0000 g | Freq: Two times a day (BID) | INTRAVENOUS | Status: DC
  Filled 2021-07-08: qty 100

## 2021-07-08 MED ORDER — CEFAZOLIN (ANCEF) 1 G IV SOLR: 2.0000 g | Freq: Two times a day (BID) | INTRAVENOUS | Status: DC

## 2021-07-08 MED ORDER — CEFAZOLIN SODIUM-DEXTROSE 2-4 GM/100ML-% IV SOLN
2.0000 g | Freq: Three times a day (TID) | INTRAVENOUS | Status: DC
  Administered 2021-07-08 – 2021-07-11 (×8): 2 g via INTRAVENOUS
  Filled 2021-07-08 (×11): qty 100

## 2021-07-08 MED ORDER — CEFAZOLIN SODIUM-DEXTROSE 1-4 GM/50ML-% IV SOLN
1.0000 g | Freq: Two times a day (BID) | INTRAVENOUS | Status: DC
  Administered 2021-07-08: 1 g via INTRAVENOUS
  Filled 2021-07-08 (×2): qty 50

## 2021-07-08 NOTE — Progress Notes (Signed)
[facility] PROGRESS NOTE  Kristy [name] NID:[id number] DOB: [birth year] DOA: 06/10/2021 PCP: Darrow Bussing, MD  Status: Remains inpatient appropriate because:Persistent severe electrolyte disturbances, Altered mental status, Unsafe d/c plan, IV treatments appropriate due to intensity of illness or inability to take PO, and Inpatient level of care appropriate due to severity of illness  Dispo: The patient is from: Home              Anticipated d/c is to: SNF              Patient currently is not medically stable to d/c.   Difficult to place patient No    Level of care: Progressive  Code Status: Full Family Communication: Updated daughter [name] on 8/25 regarding new UTI and infectious related acute delirium.  On 8/26 defy daughter of need for consent for transfusion. DVT prophylaxis: SCDs COVID vaccination status: Pfizer 02/06/2020 and 02/28/2020; COVID booster been 07/02/2021  Foley catheter: Yes POA: No Date inserted: 06/16/21-dc'd 8/23 but replaced on 8/24 secondary to recurrent urinary retention Still needed?/Rationale: Yes-recurrent urinary retention-needs eventual bladder training prior to removal  HPI: 75 y.o. female with a history of alcohol use who presented to the ED for frequent falls, generalized weakness, and confusion. She was found to be afebrile, hypertensive, and without hypoxia. Also had hypokalemia (K 2.6), SCr 1.88 (above baseline ~1.5), LFT elevation (AST 576, ALT 305). Ammonia normal. EtOH, salicylate, tylenol levels negative. CT head and cervical spine were reassuring, though the patient was unable to rise to seated position from laying prompting consultation for admission. The patient was given potassium supplementation, started on CIWA protocol, and admitted with note made of continued AMS. She has remained weak and confused, though on 8/5 became less interactive, retaining urine, WBC noted to be rising. Blood and urine cultures ordered, ceftriaxone given with  bacteriuria and urinary retention. MR brain has not been done yet. Neurology consulted with worsening mental status and nuchal rigidity. LP under fluoro requested, empirically treating meningitis/encephalitis.   Subjective: Much more alert today and communicative.  Still mildly confused but significantly improved as compared to last week  Objective: Vitals:   07/08/21 0436 07/08/21 0621  BP: (!) 173/97 134/68  Pulse: (!) 120 (!) 104  Resp: (!) 21 (!) 22  Temp: 98.9 F (37.2 C)   SpO2: 100%     Intake/Output Summary (Last 24 hours) at 07/08/2021 0746 Last data filed at 07/07/2021 2318 Gross per 24 hour  Intake 100 ml  Output 2050 ml  Net -1950 ml   Filed Weights   07/04/21 0500 07/07/21 0500 07/08/21 0500  Weight: 87.5 kg 89 kg 89.9 kg    Exam: Constitutional: Awake and alert, calm. Pulmonary: Lungs are clear to auscultation anteriorly, respiratory effort is normal, no further tachypnea.  Room air. Cardiovascular: Sinus rhythm w/ unifocal PVCs, chronic stable lower extremity edema, normotensive, S1-S2 Abdomen: Abdominal pain with palpation has resolved.  Normoactive with bowel sounds present.  PEG tube in place.  LBM 8/27 Genitourinary: Foley catheter in place draining yellow urine to bedside bag Musculoskeletal: Still has some preferential positioning of head and neck to the right but overall after receiving medications is able to move head to the midline.  Neurologic: CN 2-12 grossly intact. Sensation intact, Strength 2/5 x all 4 extremities.  Psychiatric: Alert and oriented times name and place.  Not to year or situation.  Patient believes that pillow by her side was her purse.  Reoriented.  Much more alert than last week.  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; meningitis ruled out UTI (POA);  - Neuro, ID, and Neurosurgery  consulted -TTE: No vegetation/endocarditis  -S/P I/D on  06/16/21 -drainage large epidural abscess -cultures positive for MSSA -Completed 2 weeks of nafcillin -Continue rifampin for duration of treatment -Continue scheduled low-dose Skelaxin and Tylenol for neck spasm and contracture  -8/29 DC Rocephin and resume Ancef on previous schedule with last doses due on 10/2  Sepsis physiology  2/2 suspected CAUTI/possible associated pyelonephritis/sepsis ruled out -Patient presented with pan sensitive staph UTI which was treated effectively -Patient required Foley catheter due to persistent AKI as well as at least one episode of acute urinary retention -Preliminary urine culture suggestive of KLEBSIELLA ORNITHINOLYTICA at final culture with multiple species -8/29 will transition back to Ancef to treat epidural abscess -Continues to have tachycardia with DVT ruled out.  Suboptimal blood pressure readings over the weekend therefore midodrine initiated -Previous abdominal pain resolved  Acute delirium -Secondary to UTI and evolving sepsis physiology -As of 8/29 almost back to baseline  Severe hypoalbuminemia -Albumin <1.0 w/ normal phosphorus -8/25 given 4 runs of 25% albumin every 6 hours with significant improvement in urine output and renal function with BUN 27 and creatinine 1.17 albumin up to 1.8.  As of 8/29 albumin down to 1.5 -Continue tube feeding as ordered  Acute metabolic encephalopathy 2/2 sepsis physiology at presentation and persistent hypernatremia/associated profound dysphagia - Neurology consulted - bedside LP unable to be performed  - MRI remarkable for osteo/epidural abscess as above -PEG tube placed 8/23 and will resume tube feedings as prior -Hypernatremia has resolved.  Presenting infection under control.  New issue of UTI with mild delirium now improving with treatment.  Recurrent urinary retention -Foley catheter discontinued on 8/23 and required I/O catheterization with  large volume retained urine x 2 after removal -Unable to use Flomax since it will clog feeding tube so have changed to Cardura -Continue Foley cath -Likely can begin bladder training by clamping catheter at intervals in the next few days  Epistaxis Resolved -ENT/Dr. Ezzard Standing evaluated patient and cauterized bleeding vessel in right anterior nostril with silver nitrate, aspirin and heparin stopped. -No further bleeding   Macrocytosis /anemia of critical illness as well as associated chronic kidney disease -Hgb nadir 6.7 after recent epistaxis she was given 1 unit of PRBCs with improvement of hemoglobin to 8.2.  Hemoglobin down to 6.8 on 8/21 and was given an additional unit of PRBCs  -8/26 hemoglobin has drifted down to 7.1 was given 1 unit of PRBCs and as of 8/29 hemoglobin 9.0  Thrombocytopenia  -platelets 140,000 on 8/9 and several days later decreased to just above 100,000 -8/17 platelets 105,000 and as of 8/25 platelets 147,000 -Aspirin and heparin discontinued in context of epistaxis and it is unlikely we will be able to resume anticoagulants for DVT prophylaxis  AKI on stage IIIb CKD, stabilizing:  -Baseline renal function: BUN 24 and creatinine 1.5 with GFR slightly greater than 30 - 8/29 BUN 27 and creatinine 1..03   UTI, POA, resolved:  - Covered by above abx - treatment completed   Ambulatory dysfunction with fall at home, severe weakness:  - Secondary to above - PT/OT currently recommending SNF rehabilitation once acute infectious process under better control -    Alcoholic hepatitis/regular alcohol use:  - Based on AST predominance of LFT elevations. Hepatitis panel nonreactive.  Family confirms that patient drank  2 beers per day prior to admission - No indication for prednisolone at this time. Continue trending. - AST/ALT downtrending - bilirubin increased marginally  -Per family patient basically drank 1-2 peers daily with alcohol level negative in the ER -Will require  eventual cessation counseling once mentation at baseline -Continue thiamine, folate and multiple vitamin   Hypokalemia: -8/16 potassium was low at 3.1 and after repletion has increased to 3.5 -8/22 potassium 4.2 but on 8/23 potassium down to 3.6.  Patient also having unifocal PVCs.  Continue low-dose daily potassium per tube and follow labs.  Goal for potassium should be >/= 4.0   Severe protein caloric malnutrition  -Continue tube feeds and free water flushes via cortrack -Repeat albumin level on 8/19 was 1.2-remain down to 1.0 as of 8/23 -Status post PEG tube placement Nutrition Problem: Moderate Malnutrition Etiology: chronic illness Signs/Symptoms: moderate muscle depletion, moderate fat depletion, mild muscle depletion, mild fat depletion Interventions: Prostat, Tube feeding  HTN/grade 1 diastolic dysfunction: -Suboptimal blood pressure readings in context of possible evolving sepsis therefore Norvasc discontinued on 8/25 -Echo this admission with an EF of 65 to 70% with right 1 diastolic physiology, also noted with mild MR and AR -Was +9 L after IV fluid rehydration therefore was given one-time dose of IV Lasix over the weekend   HLD: - Hold statin for now; AST has decreased to 51 and ALT normal at 38 total bili slightly elevated at 1.3      Data Reviewed: Basic Metabolic Panel: Recent Labs  Lab 07/04/21 0400 07/05/21 0352 07/06/21 0217 07/07/21 0258 07/08/21 0311  NA 136 134* 136 137 135  K 3.5 3.8 4.4 4.2 4.4  CL 105 104 107 106 105  CO2 21* 22 20* 20* 20*  GLUCOSE 196* 142* 101* 111* 107*  BUN 29* 27* 28* 27* 27*  CREATININE 1.39* 1.17* 1.14* 1.02* 1.03*  CALCIUM 8.2* 8.4* 8.3* 8.7* 8.8*  MG  --   --  1.6*  --  1.8  PHOS 4.5 3.7 3.3 3.8 3.9   Liver Function Tests: Recent Labs  Lab 07/04/21 0400 07/05/21 0352 07/06/21 0217 07/07/21 0258 07/08/21 0311  AST 23 17 18 18 21   ALT 7 6 7 9 8   ALKPHOS 124 146* 174* 173* 165*  BILITOT 1.3* 1.1 1.3* 1.0 1.0   PROT 5.4* 5.9* 5.8* 6.0* 6.4*  ALBUMIN <1.0* 1.8* 1.5* 1.4* 1.5*   No results for input(s): LIPASE, AMYLASE in the last 168 hours. No results for input(s): AMMONIA in the last 168 hours. CBC: Recent Labs  Lab 07/04/21 0400 07/05/21 0352 07/05/21 1930 07/06/21 0217 07/07/21 0258 07/08/21 0311  WBC 5.8 5.8  --  6.3 8.4 8.3  NEUTROABS  --  4.3  --  4.5 6.2 6.0  HGB 7.6* 7.1* 8.3* 8.2* 8.7* 9.0*  HCT 24.4* 22.3* 25.5* 26.0* 27.7* 27.9*  MCV 97.2 98.2  --  97.4 98.6 97.9  PLT 147* 151  --  180 211 228   Cardiac Enzymes: No results for input(s): CKTOTAL, CKMB, CKMBINDEX, TROPONINI in the last 168 hours. BNP (last 3 results) Recent Labs    07/06/21 0217  BNP 452.3*    ProBNP (last 3 results) No results for input(s): PROBNP in the last 8760 hours.  CBG: Recent Labs  Lab 07/07/21 1208 07/07/21 1709 07/07/21 1925 07/07/21 2314 07/08/21 0358  GLUCAP 100* 121* 105* 90 113*    Recent Results (from the past 240 hour(s))  Culture, blood (Routine X 2) w Reflex to ID Panel  Status: None   Collection Time: 06/30/21  8:40 AM   Specimen: BLOOD  Result Value Ref Range Status   Specimen Description BLOOD BLOOD RIGHT HAND  Final   Special Requests AEROBIC BOTTLE ONLY Blood Culture adequate volume  Final   Culture   Final    NO GROWTH 5 DAYS Performed at [facility], [address], [place], [place] [address]    Report Status 07/05/2021 FINAL  Final  Culture, blood (Routine X 2) w Reflex to ID Panel     Status: None   Collection Time: 06/30/21  8:40 AM   Specimen: BLOOD  Result Value Ref Range Status   Specimen Description BLOOD RIGHT ANTECUBITAL  Final   Special Requests   Final    BOTTLES DRAWN AEROBIC AND ANAEROBIC Blood Culture results may not be optimal due to an inadequate volume of blood received in culture bottles   Culture   Final    NO GROWTH 5 DAYS Performed at [facility], [address], [place], [place] [address]    Report Status 07/05/2021  FINAL  Final  Urine Culture     Status: Abnormal   Collection Time: 07/03/21  8:46 AM   Specimen: Urine, Catheterized  Result Value Ref Range Status   Specimen Description URINE, CATHETERIZED  Final   Special Requests   Final    NONE Performed at [facility], [address], [place], [place] [address]    Culture MULTIPLE SPECIES PRESENT, SUGGEST RECOLLECTION (A)  Final   Report Status 07/05/2021 FINAL  Final  Culture, blood (Routine X 2) w Reflex to ID Panel     Status: None (Preliminary result)   Collection Time: 07/04/21  8:03 AM   Specimen: BLOOD RIGHT HAND  Result Value Ref Range Status   Specimen Description BLOOD RIGHT HAND  Final   Special Requests   Final    BOTTLES DRAWN AEROBIC ONLY Blood Culture results may not be optimal due to an inadequate volume of blood received in culture bottles   Culture   Final    NO GROWTH 4 DAYS Performed at [facility], [address], [place], [place] [address]    Report Status PENDING  Incomplete  Culture, blood (Routine X 2) w Reflex to ID Panel     Status: None (Preliminary result)   Collection Time: 07/04/21  8:04 AM   Specimen: BLOOD RIGHT HAND  Result Value Ref Range Status   Specimen Description BLOOD RIGHT HAND  Final   Special Requests   Final    BOTTLES DRAWN AEROBIC ONLY Blood Culture results may not be optimal due to an inadequate volume of blood received in culture bottles   Culture   Final    NO GROWTH 4 DAYS Performed at [facility], [address] [place], [place] [address]    Report Status PENDING  Incomplete      Studies: VAS [place] LOWER EXTREMITY VENOUS (DVT)  Result Date: 07/07/2021  Lower Venous DVT Study Patient Name:  Kristy [name]  Date of Exam:   07/06/2021 Medical Rec #: [id number]      Accession #:    [id number] Date of Birth: 28-Dec-1945       Patient Gender: F Patient Age:   49 years Exam Location:  [facility] Procedure:      VAS [place] LOWER EXTREMITY VENOUS (DVT) Referring Phys: DAWOOD  ELGERGAWY --------------------------------------------------------------------------------  Indications: Swelling.  Limitations: Altered mental status, inability  to bend legs. Comparison Study: No prior study on file Performing Technologist: Sherren Kerns RVS  Examination Guidelines: A complete evaluation includes B-mode imaging, spectral Doppler, color Doppler, and power Doppler as needed of all accessible portions of each vessel. Bilateral testing is considered an integral part of a complete examination. Limited examinations for reoccurring indications may be performed as noted. The reflux portion of the exam is performed with the patient in reverse Trendelenburg.  +---------+---------------+---------+-----------+----------+-------------------+ RIGHT    CompressibilityPhasicitySpontaneityPropertiesThrombus Aging      +---------+---------------+---------+-----------+----------+-------------------+ CFV      Full                                                             +---------+---------------+---------+-----------+----------+-------------------+ SFJ      Full                                                             +---------+---------------+---------+-----------+----------+-------------------+ FV Prox  Full                                                             +---------+---------------+---------+-----------+----------+-------------------+ FV Mid   Full                                                             +---------+---------------+---------+-----------+----------+-------------------+ FV DistalFull                                                             +---------+---------------+---------+-----------+----------+-------------------+ PFV                                                   Not well visualized +---------+---------------+---------+-----------+----------+-------------------+ POP                     Yes      Yes                   patent by color and                                                       Doppler             +---------+---------------+---------+-----------+----------+-------------------+ PTV      Full                                                             +---------+---------------+---------+-----------+----------+-------------------+  PERO     Full                                                             +---------+---------------+---------+-----------+----------+-------------------+   +---------+---------------+---------+-----------+----------+-------------------+ LEFT     CompressibilityPhasicitySpontaneityPropertiesThrombus Aging      +---------+---------------+---------+-----------+----------+-------------------+ CFV      Full           Yes      Yes                                      +---------+---------------+---------+-----------+----------+-------------------+ SFJ      Full                                                             +---------+---------------+---------+-----------+----------+-------------------+ FV Prox  Full                                                             +---------+---------------+---------+-----------+----------+-------------------+ FV Mid   Full                                                             +---------+---------------+---------+-----------+----------+-------------------+ FV DistalFull                                                             +---------+---------------+---------+-----------+----------+-------------------+ PFV                                                   Not well visualized +---------+---------------+---------+-----------+----------+-------------------+ POP                                                   patent by color and                                                       Doppler              +---------+---------------+---------+-----------+----------+-------------------+ PTV      Full                                                             +---------+---------------+---------+-----------+----------+-------------------+  PERO     Full                                                             +---------+---------------+---------+-----------+----------+-------------------+     Summary: RIGHT: - There is no evidence of deep vein thrombosis in the lower extremity. However, portions of this examination were limited- see technologist comments above.  - Ultrasound characteristics of enlarged lymph nodes are noted in the groin.  LEFT: - There is no evidence of deep vein thrombosis in the lower extremity. However, portions of this examination were limited- see technologist comments above.  *See table(s) above for measurements and observations. Electronically signed by Sherald Hess MD on 07/07/2021 at 2:26:33 PM.    Final     Scheduled Meds:  sodium chloride   Intravenous Once   sodium chloride   Intravenous Once   acetaminophen (TYLENOL) oral liquid 160 mg/5 mL  650 mg Per Tube Q6H   allopurinol  300 mg Per Tube Daily   Chlorhexidine Gluconate Cloth  6 each Topical Daily   diclofenac Sodium  4 g Topical QID   folic acid  1 mg Per Tube Daily   free water  125 mL Per Tube Q4H   heparin injection (subcutaneous)  5,000 Units Subcutaneous Q8H   midodrine  5 mg Per Tube TID WC   multivitamin  15 mL Per Tube Daily   niacin  500 mg Per Tube QHS   rifampin  300 mg Oral Q12H   thiamine  100 mg Per Tube Daily   Continuous Infusions:  cefTRIAXone (ROCEPHIN)  IV 2 g (07/07/21 0911)   feeding supplement (OSMOLITE 1.2 CAL) 1,000 mL (07/07/21 1534)    Principal Problem:   Abscess in epidural space of cervical spine Active Problems:   Fall   Alcoholic hepatitis   Altered mental status   Acute delirium   Sepsis (HCC)   Malnutrition of moderate  degree   Consultants: Neurology Psychiatry Infectious disease Neurosurgery  Procedures: Echocardiogram Cervical discectomy and fusion on 06/16/2021  Antibiotics: Acyclovir on 8/5 and 8/6 Ampicillin 8/5 through 8/8 Ceftriaxone 8/5 through 8/9 Vancomycin 8/6 through 8/8 Nafcillin 8/9 >> Rifampin 8/12 >>   Time spent: 45 minutes    Junious Silk ANP  [facility] 7 am - 330 pm/M-F for direct patient care and secure chat Please refer to Amion for contact info 27  days

## 2021-07-08 NOTE — Progress Notes (Signed)
Physical Therapy Treatment Patient Details Name: Kristy [name] MRN: [id number] DOB: 10-Mar-1946 Today's Date: 07/08/2021    History of Present Illness 75yo female presenting to [facility] ED on 8/1 after a fall on 7/29. Pt also fell on 8/2 while in the ED. Pt found to be hypokalemic upon admission. 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 Pt is now s/p drainage of abscess and C5-6 ACDF on 06/16/21. Also noted R SI septic arthritis.  PMH: HTN, polycthemia, alcohol use.    PT Comments    Pt making little progress toward goals, limited mostly by pain, but pt also delirious today.  Emphasis on ROM, rolling, transitions to EOB, sitting balance.  Pt in too much pain/discomfort to continue and was returned to supine.\    Follow Up Recommendations  SNF;Supervision/Assistance - 24 hour     Equipment Recommendations  Other (comment) (TBA)    Recommendations for Other Services       Precautions / Restrictions Precautions Precautions: Fall;Cervical Precaution Comments: PEG Restrictions Other Position/Activity Restrictions: no brace needed    Mobility  Bed Mobility Overal bed mobility: Needs Assistance Bed Mobility: Rolling;Sidelying to Sit;Sit to Sidelying Rolling: +2 for physical assistance;Max assist Sidelying to sit: Total assist;+2 for physical assistance     Sit to sidelying: Total assist;+2 for physical assistance General bed mobility comments: pt unable/unwilling to help and fearful of pain.  Assisted trunk and LE's--Cradled pt's neck during transition to sitting/supine.    Transfers                 General transfer comment: Deferred  Ambulation/Gait                 Stairs             Wheelchair Mobility    Modified Rankin (Stroke Patients Only)       Balance Overall balance assessment: Needs assistance Sitting-balance support: Bilateral upper extremity supported;No upper  extremity supported;Feet supported;Feet unsupported Sitting balance-Leahy Scale: Poor Sitting balance - Comments: sat EOB 6 min- working on balance, gentle movement, cervical neutral.  maximal assist in general                                    Cognition Arousal/Alertness: Awake/alert;Lethargic Behavior During Therapy: Flat affect Overall Cognitive Status: Impaired/Different from baseline Area of Impairment: Attention;Memory;Following commands;Safety/judgement;Awareness;Problem solving;Orientation                 Orientation Level: Situation;Time Current Attention Level: Focused;Sustained Memory: Decreased recall of precautions Following Commands: Follows one step commands inconsistently   Awareness: Intellectual Problem Solving: Slow processing        Exercises Other Exercises Other Exercises: Gentle PROM to all 4 extremities, limited by pt's cying and yelling out to stop.    General Comments General comments (skin integrity, edema, etc.): pt was mildly delirious and painful, crying out with the slightest movement.  Part of the session encompassed peri care and the rest sitting EOB      Pertinent Vitals/Pain Pain Assessment: Faces Faces Pain Scale: Hurts even more Pain Location: moaning/crying with minimal movement Pain Descriptors / Indicators: Grimacing;Crying;Moaning Pain Intervention(s): Limited activity within patient's tolerance;Monitored during session;RN gave pain meds during session    Home Living                      Prior Function  PT Goals (current goals can now be found in the care plan section) Acute Rehab PT Goals Patient Stated Goal: per family for pt to get better PT Goal Formulation: Patient unable to participate in goal setting Time For Goal Achievement: 07/08/21 Potential to Achieve Goals: Fair Progress towards PT goals: Not progressing toward goals - comment (limited by pain, goals updated today)     Frequency    Min 2X/week      PT Plan Current plan remains appropriate    Co-evaluation              AM-PAC PT "6 Clicks" Mobility   Outcome Measure  Help needed turning from your back to your side while in a flat bed without using bedrails?: Total Help needed moving from lying on your back to sitting on the side of a flat bed without using bedrails?: Total Help needed moving to and from a bed to a chair (including a wheelchair)?: Total Help needed standing up from a chair using your arms (e.g., wheelchair or bedside chair)?: Total Help needed to walk in hospital room?: Total Help needed climbing 3-5 steps with a railing? : Total 6 Click Score: 6    End of Session   Activity Tolerance: Patient limited by pain Patient left: in bed;with call bell/phone within reach;with bed alarm set;with nursing/sitter in room;with SCD's reapplied Nurse Communication: Mobility status PT Visit Diagnosis: Other abnormalities of gait and mobility (R26.89);Muscle weakness (generalized) (M62.81);Pain;History of falling (Z91.81);Adult, failure to thrive (R62.7) Pain - part of body:  (generalized)     Time: 3419-6222 PT Time Calculation (min) (ACUTE ONLY): 27 min  Charges:  $Therapeutic Activity: 23-37 mins                     07/08/2021  Jacinto Halim., PT Acute Rehabilitation Services [phone]  (pager) [phone]  (office)   Eliseo Gum Ady Heimann 07/08/2021, 4:32 PM

## 2021-07-08 NOTE — Plan of Care (Signed)
?  Problem: Elimination: ?Goal: Will not experience complications related to bowel motility ?Outcome: Progressing ?  ?Problem: Pain Managment: ?Goal: General experience of comfort will improve ?Outcome: Progressing ?  ?Problem: Safety: ?Goal: Ability to remain free from injury will improve ?Outcome: Progressing ?  ?

## 2021-07-09 ENCOUNTER — Inpatient Hospital Stay (HOSPITAL_COMMUNITY): Payer: Medicare Other

## 2021-07-09 DIAGNOSIS — R52 Pain, unspecified: Secondary | ICD-10-CM

## 2021-07-09 LAB — COMPREHENSIVE METABOLIC PANEL
ALT: 8 U/L (ref 0–44)
AST: 22 U/L (ref 15–41)
Albumin: 1.3 g/dL — ABNORMAL LOW (ref 3.5–5.0)
Alkaline Phosphatase: 160 U/L — ABNORMAL HIGH (ref 38–126)
Anion gap: 10 (ref 5–15)
BUN: 28 mg/dL — ABNORMAL HIGH (ref 8–23)
CO2: 21 mmol/L — ABNORMAL LOW (ref 22–32)
Calcium: 8.7 mg/dL — ABNORMAL LOW (ref 8.9–10.3)
Chloride: 104 mmol/L (ref 98–111)
Creatinine, Ser: 1.03 mg/dL — ABNORMAL HIGH (ref 0.44–1.00)
GFR, Estimated: 57 mL/min — ABNORMAL LOW (ref >60)
Glucose, Bld: 118 mg/dL — ABNORMAL HIGH (ref 70–99)
Potassium: 4.2 mmol/L (ref 3.5–5.1)
Sodium: 135 mmol/L (ref 135–145)
Total Bilirubin: 1.1 mg/dL (ref 0.3–1.2)
Total Protein: 5.8 g/dL — ABNORMAL LOW (ref 6.5–8.1)

## 2021-07-09 LAB — CBC WITH DIFFERENTIAL/PLATELET
Abs Immature Granulocytes: 0.34 10*3/uL — ABNORMAL HIGH (ref 0.00–0.07)
Basophils Absolute: 0 10*3/uL (ref 0.0–0.1)
Basophils Relative: 0 %
Eosinophils Absolute: 0.7 10*3/uL — ABNORMAL HIGH (ref 0.0–0.5)
Eosinophils Relative: 9 %
HCT: 26.3 % — ABNORMAL LOW (ref 36.0–46.0)
Hemoglobin: 8.2 g/dL — ABNORMAL LOW (ref 12.0–15.0)
Immature Granulocytes: 4 %
Lymphocytes Relative: 9 %
Lymphs Abs: 0.8 10*3/uL (ref 0.7–4.0)
MCH: 30.8 pg (ref 26.0–34.0)
MCHC: 31.2 g/dL (ref 30.0–36.0)
MCV: 98.9 fL (ref 80.0–100.0)
Monocytes Absolute: 0.7 10*3/uL (ref 0.1–1.0)
Monocytes Relative: 9 %
Neutro Abs: 5.9 10*3/uL (ref 1.7–7.7)
Neutrophils Relative %: 69 %
Platelets: 226 10*3/uL (ref 150–400)
RBC: 2.66 MIL/uL — ABNORMAL LOW (ref 3.87–5.11)
RDW: 17.7 % — ABNORMAL HIGH (ref 11.5–15.5)
WBC: 8.6 10*3/uL (ref 4.0–10.5)
nRBC: 0 % (ref 0.0–0.2)

## 2021-07-09 LAB — GLUCOSE, CAPILLARY
Glucose-Capillary: 108 mg/dL — ABNORMAL HIGH (ref 70–99)
Glucose-Capillary: 120 mg/dL — ABNORMAL HIGH (ref 70–99)
Glucose-Capillary: 66 mg/dL — ABNORMAL LOW (ref 70–99)
Glucose-Capillary: 71 mg/dL (ref 70–99)
Glucose-Capillary: 78 mg/dL (ref 70–99)
Glucose-Capillary: 82 mg/dL (ref 70–99)
Glucose-Capillary: 95 mg/dL (ref 70–99)

## 2021-07-09 LAB — CULTURE, BLOOD (ROUTINE X 2)
Culture: NO GROWTH
Culture: NO GROWTH

## 2021-07-09 IMAGING — CT CT ELBOW*L* W/O CM
3 of 4 series · 13 of 35 positions shown, 16 images · non-contrast
Comparison: None.

CLINICAL DATA: Acute pain

EXAM:
CT OF THE UPPER LEFT EXTREMITY WITHOUT CONTRAST
TECHNIQUE: Multidetector CT imaging of the upper left extremity was performed
according to the standard protocol.

[Series 8: lfov ext 3.0 i31s 2 · axial · 0.31mm/px · z∈[-431,-329]mm · 5 of 52 slices shown, 7 images]
[im 9/52  soft-tissue]
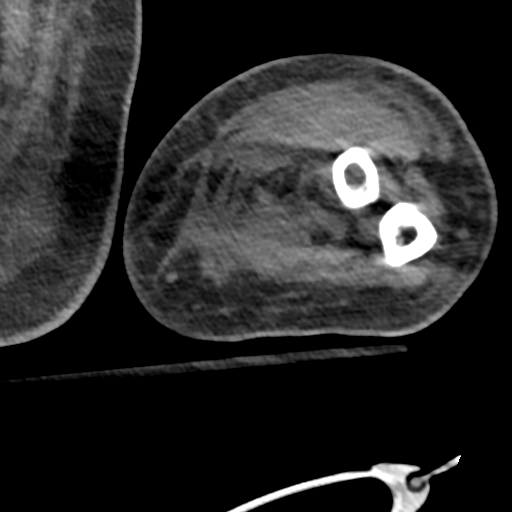
[im 9/52  bone]
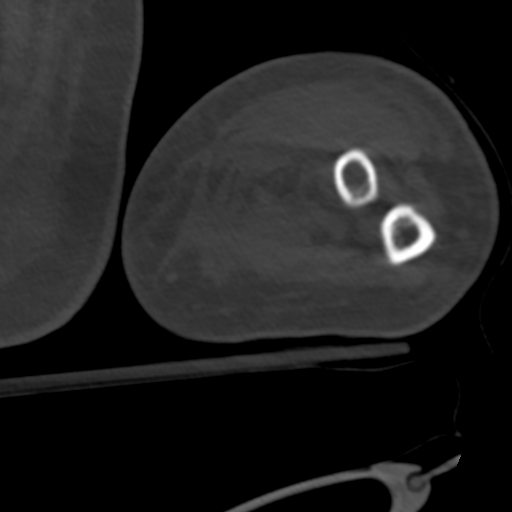
[im 18/52  bone]
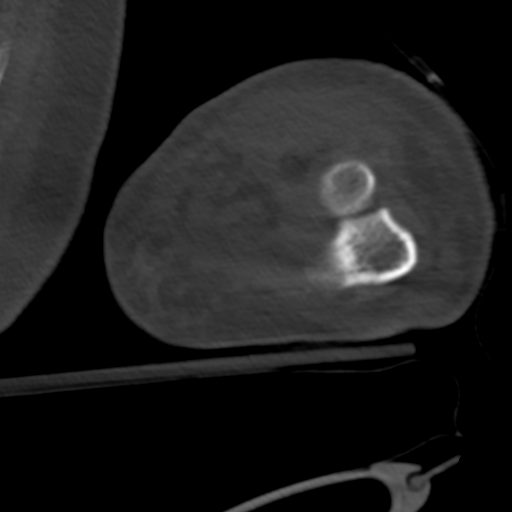
[im 26/52  bone]
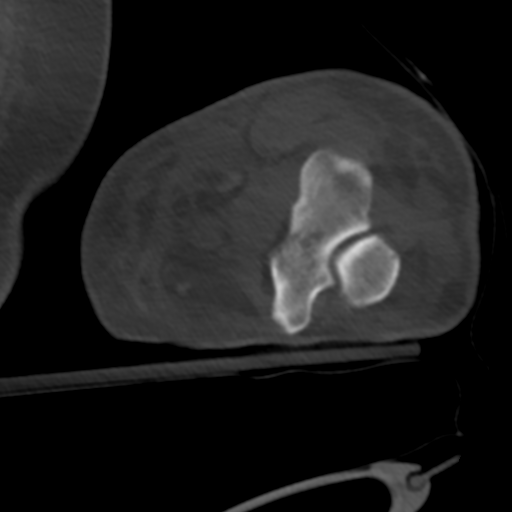
[im 35/52  bone]
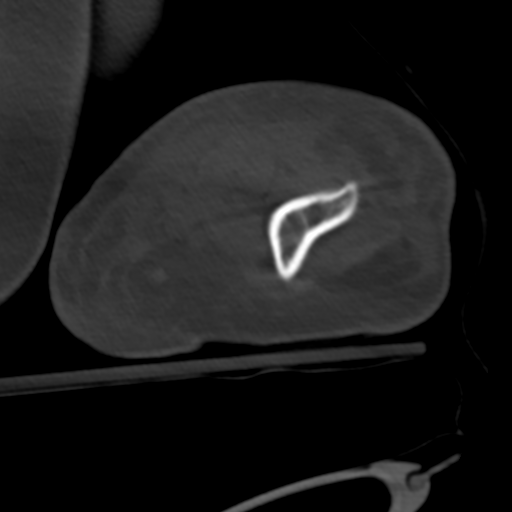
[im 43/52  soft-tissue]
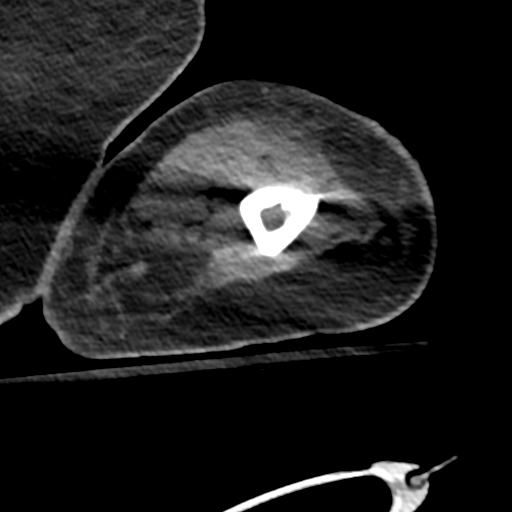
[im 43/52  bone]
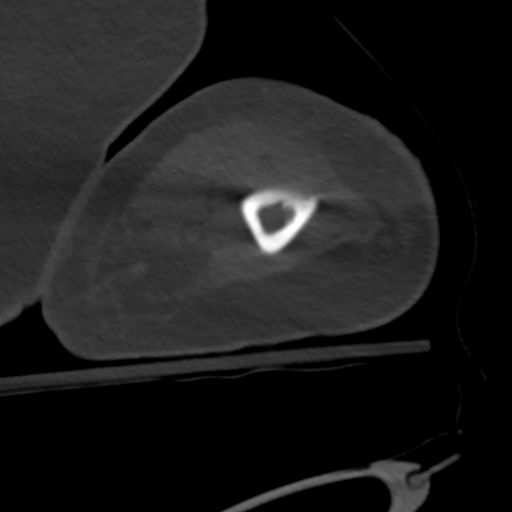

[Series 12: cor st · sagittal · 0.26mm/px · 5 of 40 slices shown, 6 images]
[im 14/40  bone]
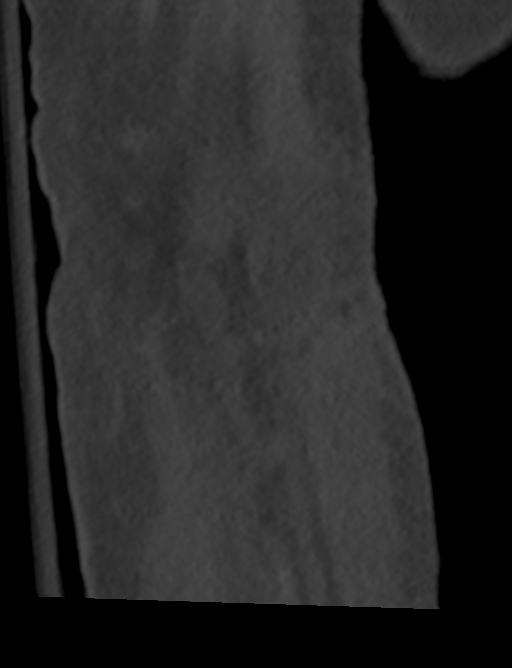
[im 17/40  bone]
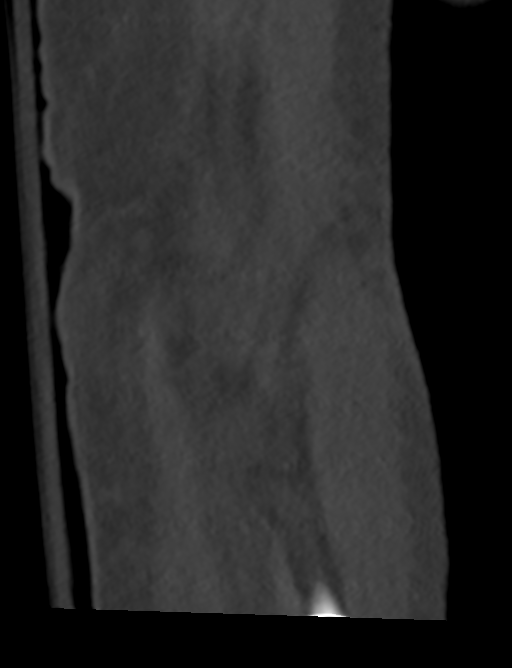
[im 20/40  soft-tissue]
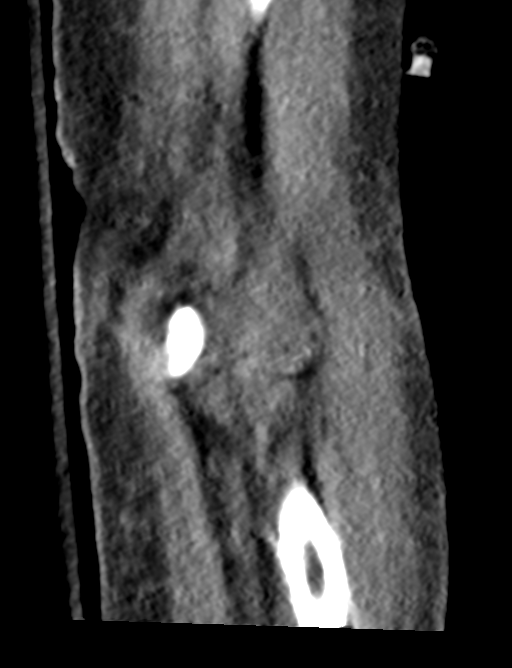
[im 20/40  bone]
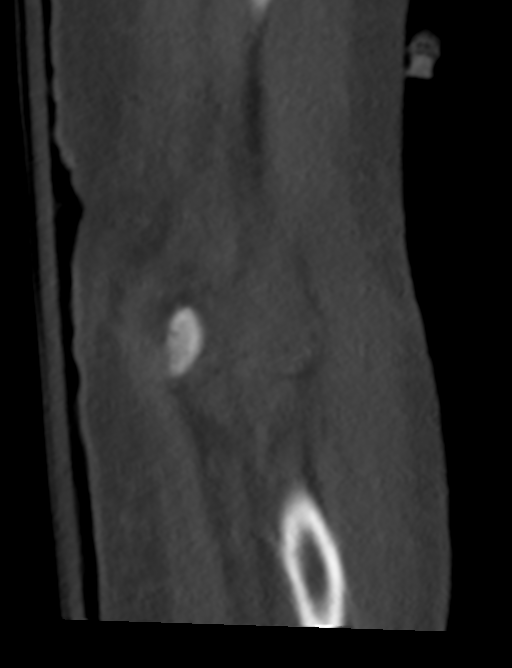
[im 23/40  bone]
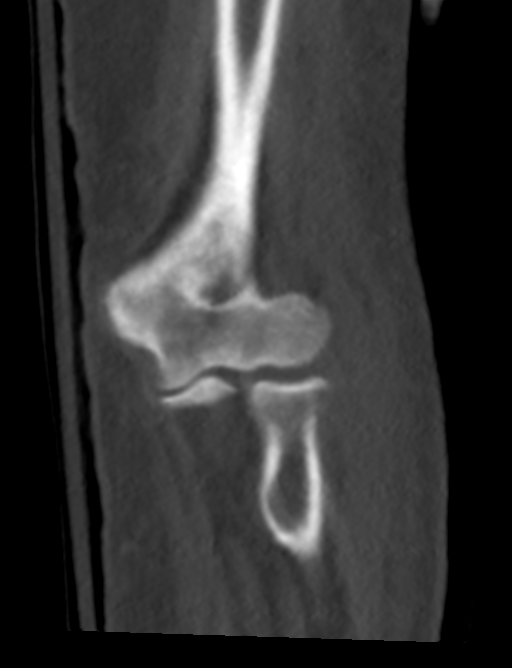
[im 27/40  bone]
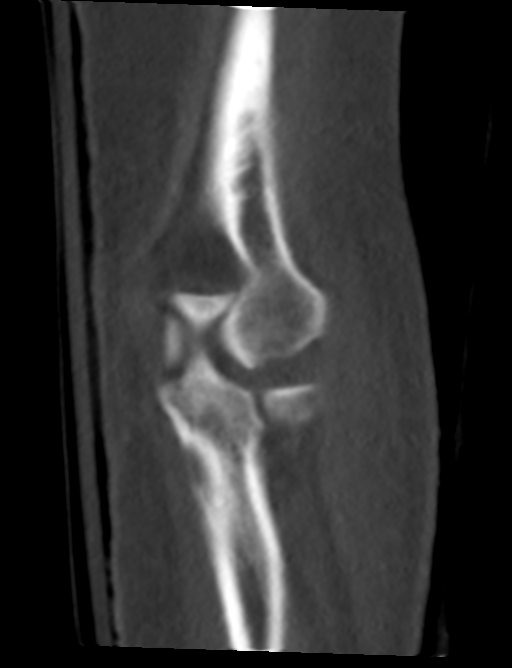

[Series 14: sag st · coronal · 0.21mm/px · 3 of 36 slices shown]
[im 8/36  bone]
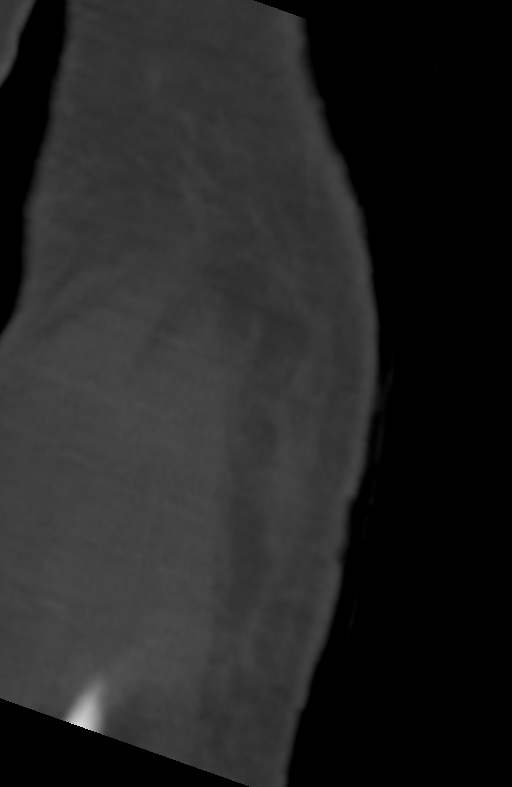
[im 15/36  bone]
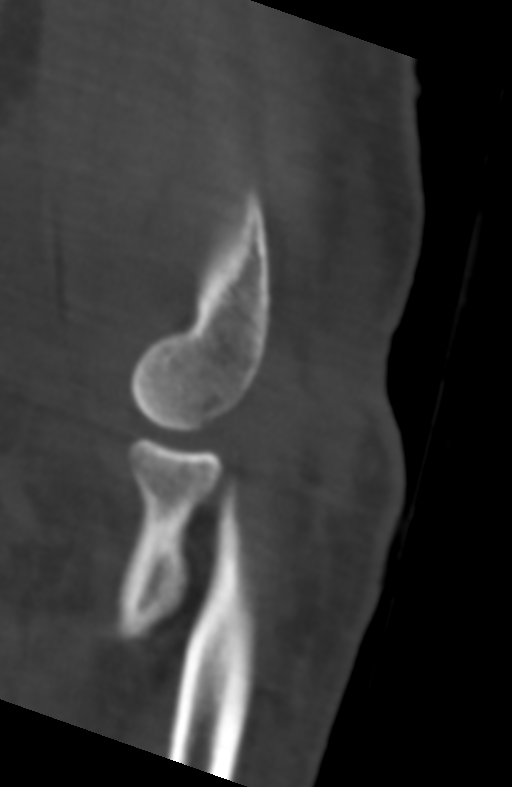
[im 22/36  bone]
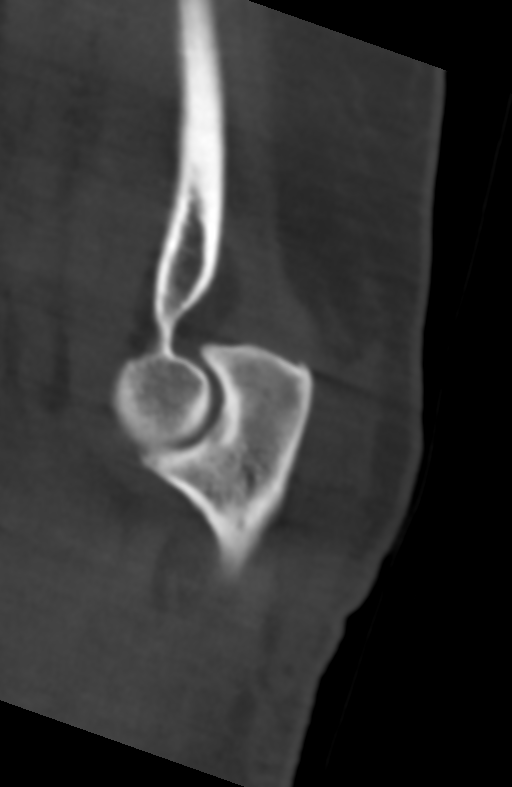

[13 of 35 positions shown; findings below may reference images not displayed]

FINDINGS: Bones/Joint/Cartilage

There is no acute fracture or dislocation. Elbow alignment is
normal. There is no significant degenerative change. There is no
joint effusion.

Ligaments

Suboptimally assessed by CT.

Muscles and Tendons

No significant muscle atrophy.

Soft tissues

There is superficial soft tissue swelling. No focal fluid collection
on noncontrast CT.
IMPRESSION: Mild generalized superficial soft tissue swelling along the elbow.

No acute osseous abnormality or joint effusion.

## 2021-07-09 IMAGING — CT CT SHOULDER*L* W/O CM
1 series · 12 of 14 positions shown, 15 images · non-contrast
Comparison: None.

CLINICAL DATA: Acute pain

EXAM:
CT OF THE UPPER LEFT EXTREMITY WITHOUT CONTRAST
TECHNIQUE: Multidetector CT imaging of the upper left extremity was performed
according to the standard protocol.

[Series 3: lt shoulder 3.0 b31s st · axial · 0.46mm/px · z∈[-209,-44]mm · 12 of 66 slices shown, 15 images]
[im 6/66  soft-tissue]
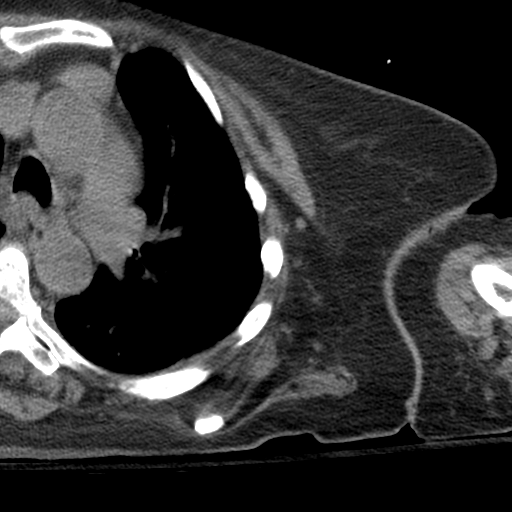
[im 6/66  bone]
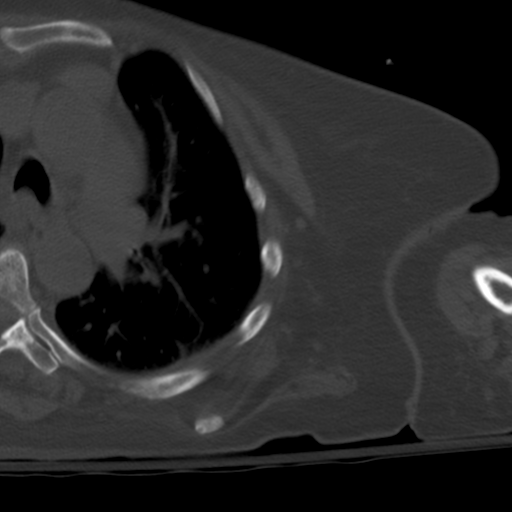
[im 11/66  bone]
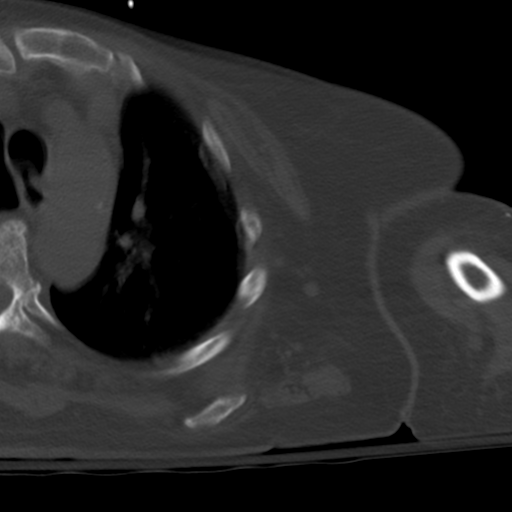
[im 16/66  bone]
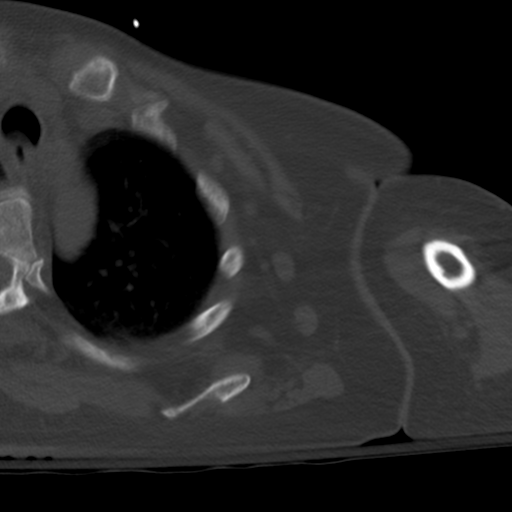
[im 21/66  bone]
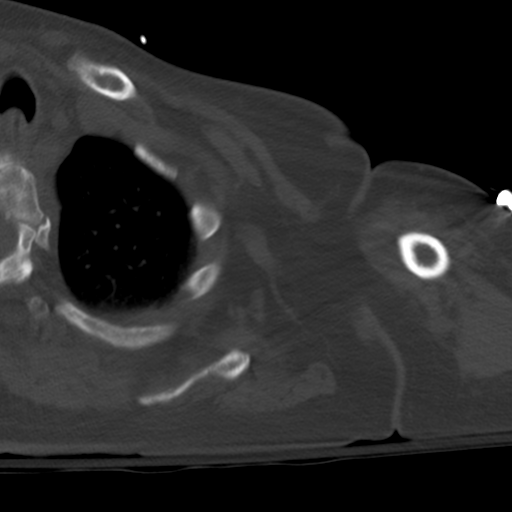
[im 26/66  soft-tissue]
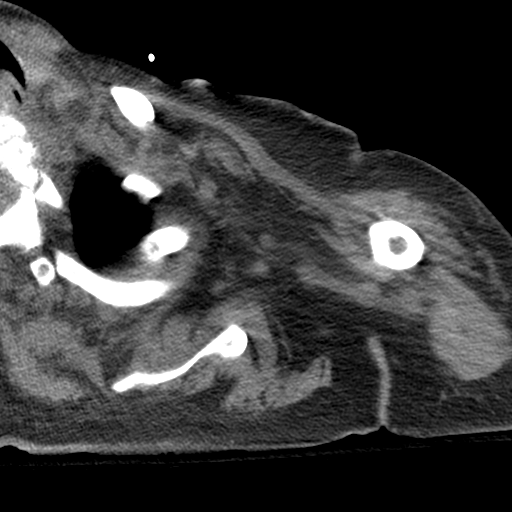
[im 26/66  bone]
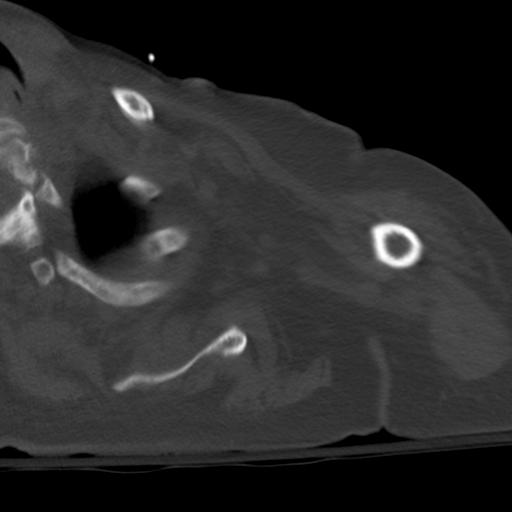
[im 31/66  bone]
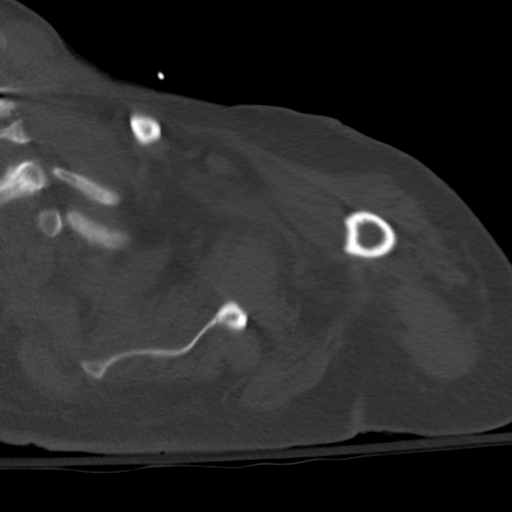
[im 36/66  bone]
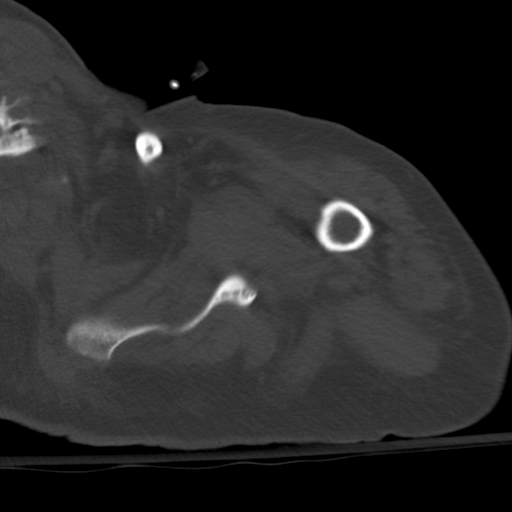
[im 41/66  bone]
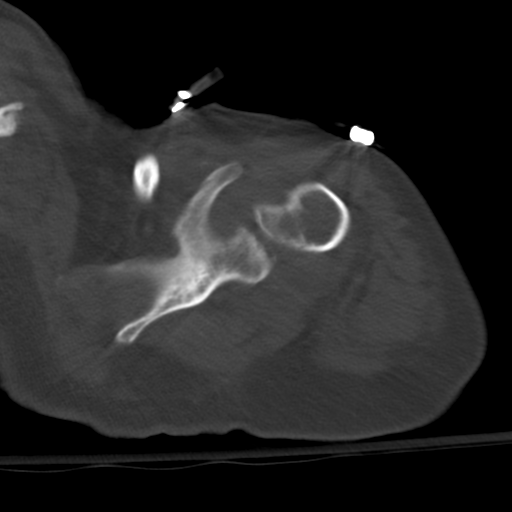
[im 46/66  soft-tissue]
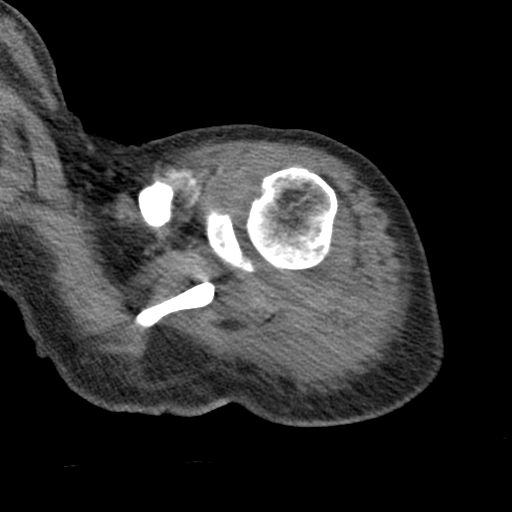
[im 46/66  bone]
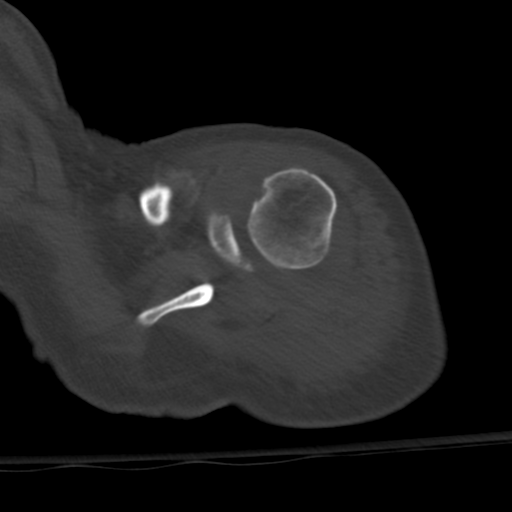
[im 51/66  bone]
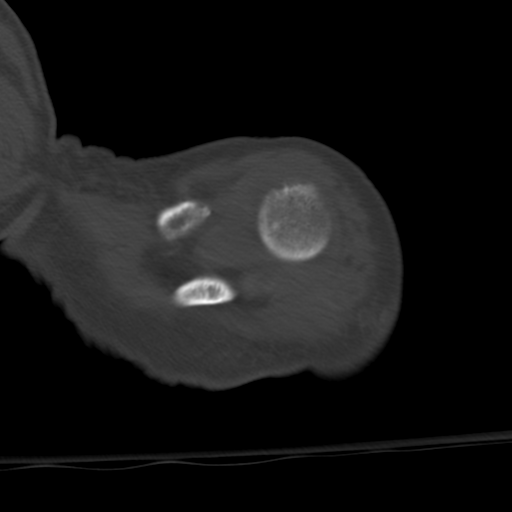
[im 56/66  bone]
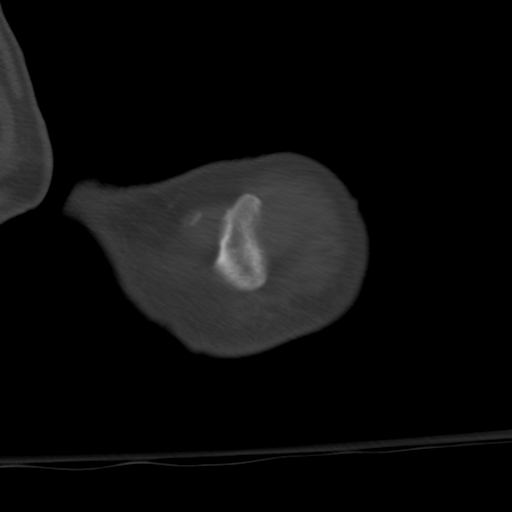
[im 61/66  bone]
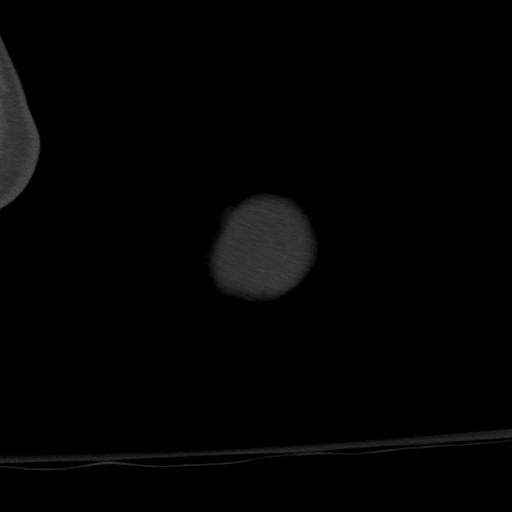

[12 of 14 positions shown; findings below may reference images not displayed]

FINDINGS: Bones/Joint/Cartilage

There is no evidence of acute fracture or dislocation. The shoulder
is held in internal rotation. There is moderate glenohumeral
osteoarthritis with subchondral sclerosis and cystic change. There
is preserved glenoid bone stock and neutral glenoid version. There
is mild widening of the AC joint and mild degenerative change.
Partially visualized ACDF hardware.

Ligaments

Suboptimally assessed by CT.

Muscles and Tendons

Grade 1 muscle atrophy of the cuff musculature. There is
mineralization along the expected course of the coracoclavicular
ligaments.

Soft tissues

No focal fluid collection. There are a few prominent left axillary
lymph nodes.
IMPRESSION: No acute fracture or dislocation.

Moderate glenohumeral osteoarthritis.

Widening of the AC joint with mild degenerative change and
mineralization along the course of the coracoclavicular ligaments,
which can be seen with chronic AC joint injury.

## 2021-07-09 MED ORDER — MORPHINE SULFATE (PF) 2 MG/ML IV SOLN
1.0000 mg | INTRAVENOUS | Status: DC | PRN
  Administered 2021-07-09 (×2): 2 mg via INTRAVENOUS
  Filled 2021-07-09 (×2): qty 1

## 2021-07-09 MED ORDER — LORAZEPAM 2 MG/ML IJ SOLN
INTRAMUSCULAR | Status: AC
  Filled 2021-07-09: qty 1

## 2021-07-09 MED ORDER — LORAZEPAM 2 MG/ML IJ SOLN
1.0000 mg | Freq: Once | INTRAMUSCULAR | Status: AC | PRN
  Administered 2021-07-09: 1 mg via INTRAVENOUS

## 2021-07-09 MED ORDER — DEXTROSE-NACL 5-0.45 % IV SOLN: INTRAVENOUS | Status: DC

## 2021-07-09 NOTE — Progress Notes (Signed)
PROGRESS NOTE    Kristy [name]  EXB:[id number] DOB: 04/03/1946 DOA: 06/10/2021 PCP: Darrow BussingKoirala, Dibas, MD    Chief Complaint  Patient presents with   Fall    Brief Narrative:   75 y.o. female with a history of alcohol use who presented to the ED for frequent falls, generalized weakness, and confusion. She was found to be afebrile, hypertensive, and without hypoxia. Also had hypokalemia (K 2.6), SCr 1.88 (above baseline ~1.5), LFT elevation (AST 576, ALT 305). Ammonia normal. EtOH, salicylate, tylenol levels negative. CT head and cervical spine were reassuring, though the patient was unable to rise to seated position from laying prompting consultation for admission. The patient was given potassium supplementation, started on CIWA protocol, and admitted with note made of continued AMS. She has remained weak and confused, though on 8/5 became less interactive, retaining urine, WBC noted to be rising. Blood and urine cultures ordered, ceftriaxone given with bacteriuria and urinary retention. MR brain has not been done yet. Neurology consulted with worsening mental status and nuchal rigidity. LP under fluoro requested, empirically treating meningitis/encephalitis.  LP was negative for meningitis/encephalitis.  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 - Rifampin 600 mg BID to be continued throughout the treatment as she can tolerate for 8 weeks; End Dates: 8/21 for nafcillin; 10/02 for cefazolin  -Continue scheduled Tylenol for neck spasm and contracture  -Patient is afebrile over last 24 hours, will continue to monitor closely, likely will change back to cefazolin in a.m. if she remains afebrile. -Venous Dopplers negative for DVT, it was obtained given her fever and tachycardia.  All function PEG -Overnight PEG was clogged, was seen by IR, was declogged, able to use again.  Left shoulder/elbow pain -Patient is complaining of left elbow/shoulder pain, her physical exam is nonspecific, will obtain CT shoulder/elbow without contrast for further evaluation.   Acute delirium -Secondary to UTI and evolving sepsis physiology  Severe hypoalbuminemia -Albumin <1.0 w/ normal phosphorus -She was given IV albumin in the past. -Continue tube feeding as ordered   Acute metabolic encephalopathy 2/2 sepsis physiology at presentation and persistent hypernatremia/associated profound dysphagia - Neurology consulted - bedside LP unable to be performed  - MRI remarkable for osteo/epidural abscess as above -PEG tube placed 8/23 and will resume tube feedings as prior -Hypernatremia has resolved.  Presenting infection under control.  New issue of UTI with mild delirium now improving with treatment.   Recurrent urinary retention -Foley catheter discontinued on 8/23 and required I/O catheterization with large volume retained urine x 2 after removal -Unable to use Flomax since it will clog feeding tube so have changed to Cardura -Continue Foley cath -Likely can begin bladder training by clamping catheter at intervals in the next few days   Epistaxis Resolved -ENT/Dr. Ezzard StandingNewman evaluated patient and cauterized bleeding vessel in right anterior nostril  with silver  nitrate, aspirin and heparin stopped. -No further bleeding    Macrocytosis /anemia of critical illness as well as associated chronic kidney disease -Hgb nadir 6.7 after recent epistaxis she was given 1 unit of PRBCs with improvement of hemoglobin to 8.2.  Hemoglobin down to 6.8 on 8/21 and was given an additional unit of PRBCs  -8/26 hemoglobin has drifted down to 7.1 with attending recommending transfusion of PRBCs.  Daughter contacted for consent.   Thrombocytopenia  -platelets 140,000 on 8/9 and several days later decreased to just above 100,000 -8/17 platelets 105,000 and as of 8/25 platelets 147,000 -Aspirin and heparin discontinued in context of epistaxis and it is unlikely we will be able to resume anticoagulants for DVT prophylaxis   AKI on stage IIIb CKD, stabilizing:  -Baseline renal function: BUN 24 and creatinine 1.5 with GFR slightly greater than 30   UTI, POA, resolved:  - Covered by above abx - treatment completed   Ambulatory dysfunction with fall at home, severe weakness:  - Secondary to above - PT/OT currently recommending SNF rehabilitation once acute infectious process under better control -    Alcoholic hepatitis/regular alcohol use:  - Based on AST predominance of LFT elevations. Hepatitis panel nonreactive.  Family confirms that patient drank 2 beers per day prior to admission - No indication for prednisolone at this time. Continue trending. - AST/ALT downtrending - bilirubin increased marginally  -Per family patient basically drank 1-2 peers daily with alcohol level negative in the ER -Will require eventual cessation counseling once mentation at baseline -Continue thiamine, folate and multiple vitamin   Hypokalemia: -8/16 potassium was low at 3.1 and after repletion has increased to 3.5 -8/22 potassium 4.2 but on 8/23 potassium down to 3.6.  Patient also having unifocal PVCs.  Continue low-dose daily potassium per tube and follow labs.  Goal for potassium  should be >/= 4.0   Severe protein caloric malnutrition  -Continue tube feeds and free water flushes via cortrack -Repeat albumin level on 8/19 was 1.2-remain down to 1.0 as of 8/23 -Status post PEG tube placement Nutrition Problem: Moderate Malnutrition Etiology: chronic illness Signs/Symptoms: moderate muscle depletion, moderate fat depletion, mild muscle depletion, mild fat depletion Interventions: Prostat, Tube feeding   HTN/grade 1 diastolic dysfunction: -Suboptimal blood pressure readings in context of possible evolving sepsis therefore Norvasc discontinued on 8/25 -Echo this admission with an EF of 65 to 70% with right 1 diastolic physiology, also noted with mild MR and AR -Was +9 L after IV fluid rehydration therefore was given one-time dose of IV Lasix over the weekend   HLD: - Hold statin for now; AST has decreased to 51 and ALT normal at 38 total bili slightly elevated at 1.3     Started on niacin given her dermatitis.   DVT prophylaxis: [place] heaprin Code Status: Full Family Communication: None at beside Disposition:   Status is: Inpatient  Remains inpatient appropriate because:Inpatient level of care appropriate due to severity of illness  Dispo: The patient is from: Home              Anticipated d/c is to: SNF              Patient currently is not medically stable to d/c.   Difficult to place patient No        Subjective:  PEG tube was clogged yesterday, clogged by IR, she complains of left shoulder/elbow pain.  Objective: Vitals:   07/08/21 2032 07/09/21 0001 07/09/21 0803 07/09/21 1200  BP: 120/72 107/69 (!) 153/93 118/69  Pulse: (!) 105 (!) 103 (!) 122 98  Resp: (!) 21 (!) 22 20 19   Temp:  99.4 F (37.4 C) 99.4 F (37.4 C) 99.3 F (37.4 C)  TempSrc:  Axillary Axillary Oral  SpO2: 100% 99% 96% 100%  Weight:      Height:        Intake/Output Summary (Last 24 hours) at 07/09/2021 1538 Last data filed at 07/09/2021 0600 Gross per 24 hour   Intake 1000 ml  Output 1100 ml  Net -100 ml   Filed Weights   07/04/21 0500 07/07/21 0500 07/08/21 0500  Weight: 87.5 kg 89 kg 89.9 kg    Examination:  Awake Alert, deconditioned, confused, chronically ill-appearing, frail. Symmetrical Chest wall movement, diminished entry at the bases RRR,No Gallops,Rubs or new Murmurs, No Parasternal Heave +ve B.Sounds, Abd Soft, No tenderness, No rebound - guarding or rigidity. She has significant upper extremity anasarca, chronic lower extremity skin changes, she has that she is of skin areas of sloughing and hyperkeratinization in healing phase. -Some intermittent tenderness in left shoulder, left elbow area,.    Data Reviewed: I have personally reviewed following labs and imaging studies  CBC: Recent Labs  Lab 07/05/21 0352 07/05/21 1930 07/06/21 0217 07/07/21 0258 07/08/21 0311 07/09/21 0206  WBC 5.8  --  6.3 8.4 8.3 8.6  NEUTROABS 4.3  --  4.5 6.2 6.0 5.9  HGB 7.1* 8.3* 8.2* 8.7* 9.0* 8.2*  HCT 22.3* 25.5* 26.0* 27.7* 27.9* 26.3*  MCV 98.2  --  97.4 98.6 97.9 98.9  PLT 151  --  180 211 228 226    Basic Metabolic Panel: Recent Labs  Lab 07/04/21 0400 07/05/21 0352 07/06/21 0217 07/07/21 0258 07/08/21 0311 07/09/21 0206  NA 136 134* 136 137 135 135  K 3.5 3.8 4.4 4.2 4.4 4.2  CL 105 104 107 106 105 104  CO2 21* 22 20* 20* 20* 21*  GLUCOSE 196* 142* 101* 111* 107* 118*  BUN 29* 27* 28* 27* 27* 28*  CREATININE 1.39* 1.17* 1.14* 1.02* 1.03* 1.03*  CALCIUM 8.2* 8.4* 8.3* 8.7* 8.8* 8.7*  MG  --   --  1.6*  --  1.8  --   PHOS 4.5 3.7 3.3 3.8 3.9  --     GFR: Estimated Creatinine Clearance: 53.3 mL/min (A) (by C-G formula based on SCr of 1.03 mg/dL (H)).  Liver Function Tests: Recent Labs  Lab 07/05/21 0352 07/06/21 0217 07/07/21 0258 07/08/21 0311 07/09/21 0206  AST 17 18 18 21 22   ALT 6 7 9 8 8   ALKPHOS 146* 174* 173* 165* 160*  BILITOT 1.1 1.3* 1.0 1.0 1.1  PROT 5.9* 5.8* 6.0* 6.4* 5.8*  ALBUMIN 1.8*  1.5* 1.4* 1.5* 1.3*    CBG: Recent Labs  Lab 07/08/21 2359 07/09/21 0307 07/09/21 0700 07/09/21 0803 07/09/21 1159  GLUCAP 108* 95 71 78 82     Recent Results (from the past 240 hour(s))  Culture, blood (Routine X 2) w Reflex to ID Panel     Status: None   Collection Time: 06/30/21  8:40 AM   Specimen: BLOOD  Result Value Ref Range Status   Specimen Description BLOOD BLOOD RIGHT HAND  Final   Special Requests AEROBIC BOTTLE ONLY Blood Culture adequate volume  Final   Culture   Final    NO GROWTH 5 DAYS Performed at [facility], [address] [place], [facility] [address]    Report Status 07/05/2021 FINAL  Final  Culture, blood (Routine X 2) w Reflex to ID Panel     Status: None   Collection Time: 06/30/21  8:40 AM   Specimen: BLOOD  Result Value Ref Range Status   Specimen Description BLOOD RIGHT ANTECUBITAL  Final   Special Requests   Final    BOTTLES DRAWN AEROBIC AND ANAEROBIC Blood Culture results may not be optimal due to an inadequate volume of blood received in culture bottles   Culture   Final    NO GROWTH 5 DAYS Performed at [facility], [address], [place], [place] [address]    Report Status 07/05/2021 FINAL  Final  Urine Culture     Status: Abnormal   Collection Time: 07/03/21  8:46 AM   Specimen: Urine, Catheterized  Result Value Ref Range Status   Specimen Description URINE, CATHETERIZED  Final   Special Requests   Final    NONE Performed at [facility], [address] [place], [place] [address]    Culture MULTIPLE SPECIES PRESENT, SUGGEST RECOLLECTION (A)  Final   Report Status 07/05/2021 FINAL  Final  Culture, blood (Routine X 2) w Reflex to ID Panel     Status: None   Collection Time: 07/04/21  8:03 AM   Specimen: BLOOD RIGHT HAND  Result Value Ref Range Status   Specimen Description BLOOD RIGHT HAND  Final   Special Requests   Final    BOTTLES DRAWN AEROBIC ONLY Blood Culture results may not be optimal due to an  inadequate volume of blood received in culture bottles   Culture   Final    NO GROWTH 5 DAYS Performed at [facility], [address] [place], [place] [address]    Report Status 07/09/2021 FINAL  Final  Culture, blood (Routine X 2) w Reflex to ID Panel     Status: None   Collection Time: 07/04/21  8:04 AM   Specimen: BLOOD RIGHT HAND  Result Value Ref Range Status   Specimen Description BLOOD RIGHT HAND  Final   Special Requests   Final    BOTTLES DRAWN AEROBIC ONLY Blood Culture results may not be optimal due to an inadequate volume of blood received in culture bottles   Culture   Final    NO GROWTH 5 DAYS Performed at [facility], [address] [place], [place] [address]    Report Status 07/09/2021 FINAL  Final         Radiology Studies: No results found.      Scheduled Meds:  acetaminophen (TYLENOL) oral liquid 160 mg/5 mL  650 mg Per Tube Q6H   allopurinol  300 mg Per Tube Daily   Chlorhexidine Gluconate Cloth  6 each Topical Daily   diclofenac Sodium  4 g Topical QID   folic acid  1 mg Per Tube Daily   free water  125 mL Per Tube Q4H   heparin injection (subcutaneous)  5,000 Units Subcutaneous Q8H   midodrine  5 mg Per Tube TID WC   multivitamin  15 mL Per Tube Daily   niacin  500 mg Per Tube QHS   rifampin  300 mg Oral Q12H   thiamine  100 mg Per Tube Daily   Continuous Infusions:   ceFAZolin (ANCEF) IV 2 g (07/09/21 1156)   dextrose 5 % and 0.45% NaCl 50 mL/hr at 07/09/21 1035   feeding supplement (OSMOLITE 1.2 CAL) 1,000 mL (07/08/21 1737)     LOS: 28  days       Huey Bienenstock, MD [facility]   To contact the attending provider between 7A-7P or the covering provider during after hours 7P-7A, please log into the web site [email or web address] and access using universal [place] password for that web site. If you do not have the password, please call the hospital operator.  07/09/2021, 3:38 PM

## 2021-07-09 NOTE — Plan of Care (Signed)

## 2021-07-09 NOTE — Progress Notes (Signed)
Occupational Therapy Treatment Patient Details Name: Kristy [name] MRN: [id number] DOB: 03-12-1946 Today's Date: 07/09/2021    History of present illness 75yo female presenting to [facility] ED on 8/1 after a fall on 7/29. Pt also fell on 8/2 while in the ED. Pt found to be hypokalemic upon admission. 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 Pt is now s/p drainage of abscess and C5-6 ACDF on 06/16/21. Also noted R SI septic arthritis.  PMH: HTN, polycthemia, alcohol use.   OT comments  Patient met lying supine in bed. Eyes remained closed throughout majority of session with patient opening eyes for 1-3 second periods. Patient making very little progress toward goals greatly limited by pain with minimal movement, decreased skin integrity and edema in all 4 extremities. Patient poorly positioned in supine and very resistant to repositioning. Patient allowed therapist to reposition BUE/BLE to reduce edema but would not allow therapist to reposition trunk. Goals updated this session. OT will continue to follow acutely.    Follow Up Recommendations  SNF;Supervision/Assistance - 24 hour    Equipment Recommendations  Other (comment) (Defer to next level of care)    Recommendations for Other Services Other (comment)    Precautions / Restrictions Precautions Precautions: Fall;Cervical Precaution Comments: PEG Restrictions Weight Bearing Restrictions: No       Mobility Bed Mobility Overal bed mobility: Needs Assistance             General bed mobility comments: Deferred; patient refused shouting with attempts at rolling in supine.    Transfers                 General transfer comment: Deferred    Balance Overall balance assessment: Needs assistance                                         ADL either performed or assessed with clinical judgement   ADL Overall ADL's : Needs  assistance/impaired     Grooming: Total assistance;Bed level Grooming Details (indicate cue type and reason): Total A for washing her face.                                     Vision       Perception     Praxis      Cognition Arousal/Alertness: Awake/alert;Lethargic Behavior During Therapy: Flat affect Overall Cognitive Status: Impaired/Different from baseline Area of Impairment: Attention;Memory;Following commands;Safety/judgement;Awareness;Problem solving;Orientation                 Orientation Level: Situation;Time Current Attention Level: Focused;Sustained Memory: Decreased recall of precautions Following Commands: Follows one step commands inconsistently   Awareness: Intellectual Problem Solving: Slow processing General Comments: Patient keeps eyes closed for majoirty of session; tangential speech throughout calling this therapist several different names (presumed names of family members).        Exercises Other Exercises Other Exercises: Attempted gentle PROM to all extremities but patient shouting in pain with minimal movement. Also attempted retrograde massage but patient began cursing at this therapist reporting severe pain.   Shoulder Instructions       General Comments Continues to be limited by pain and hospital delirium    Pertinent Vitals/ Pain       Pain Assessment: Faces Faces Pain Scale: Hurts  whole lot Pain Location: moaning/crying with minimal movement Pain Descriptors / Indicators: Grimacing;Crying;Moaning Pain Intervention(s): Limited activity within patient's tolerance;Monitored during session;Repositioned  Home Living                                          Prior Functioning/Environment              Frequency  Min 2X/week        Progress Toward Goals  OT Goals(current goals can now be found in the care plan section)  Progress towards OT goals: Not progressing toward goals - comment  (Limited by pain and lethargy)  Acute Rehab OT Goals Patient Stated Goal: per family for pt to get better OT Goal Formulation: Patient unable to participate in goal setting Time For Goal Achievement: 07/23/21 Potential to Achieve Goals: Fair ADL Goals Pt Will Perform Grooming: with max assist;bed level Pt Will Perform Upper Body Bathing: with max assist;bed level Additional ADL Goal #1: Pt will follow 1 step commands with 50% accuracy in non distracting environment. Additional ADL Goal #2: Pt will tolerate sitting EOB with Max A +2 x5 min in prep for ADLs. Additional ADL Goal #3: Pt will complete hand to mouth pattern with BUE and Mod A in prep for self-feeding.  Plan Discharge plan remains appropriate;Frequency remains appropriate    Co-evaluation                 AM-PAC OT "6 Clicks" Daily Activity     Outcome Measure   Help from another person eating meals?: Total Help from another person taking care of personal grooming?: Total Help from another person toileting, which includes using toliet, bedpan, or urinal?: Total Help from another person bathing (including washing, rinsing, drying)?: Total Help from another person to put on and taking off regular upper body clothing?: Total Help from another person to put on and taking off regular lower body clothing?: Total 6 Click Score: 6    End of Session    OT Visit Diagnosis: Unsteadiness on feet (R26.81);Other abnormalities of gait and mobility (R26.89);Repeated falls (R29.6);Muscle weakness (generalized) (M62.81);Other symptoms and signs involving cognitive function;Pain Pain - part of body:  ("all over")   Activity Tolerance Patient limited by pain   Patient Left in bed;with bed alarm set;Other (comment) (BUE elevated on pillows for edema management)   Nurse Communication Mobility status        Time: 0347-4259 OT Time Calculation (min): 17 min  Charges: OT General Charges $OT Visit: 1 Visit OT  Treatments $Therapeutic Activity: 8-22 mins  Sonny Anthes H. OTR/L Supplemental OT, Department of rehab services [phone]   Farin Buhman R H. 07/09/2021, 10:35 AM

## 2021-07-09 NOTE — Progress Notes (Signed)
  Speech Language Pathology Treatment: Dysphagia  Patient Details Name: Kristy [name] MRN: [id number] DOB: 1945/12/01 Today's Date: 07/09/2021 Time: 1440-1455 SLP Time Calculation (min) (ACUTE ONLY): 15 min  Assessment / Plan / Recommendation Clinical Impression  Pt seen at bedside for follow up for continued PO trials. Pt awakened easily to verb stim, and voiced readiness to try something to eat. When SLP indicated repositioning to maximize safe swallow, pt said "never mind - I don't want it that bad". Pt tolerated a very minimal elevation of the head of the bed, and accepted 2 small boluses of ice cream. She exhibited throat clearing after 1/2 boluses, likely due to suboptimal positioning. Pt declined further PO presentations. Earlier in her stay, mentation and lethargy were interfering with PO intake. Now it seems to be related to severe pain when repositioned, as her mentation and alertness are improved. SLP will continue to follow to assess PO readiness.   HPI HPI: Pt is a 75yo female presenting to [facility] ED on 8/1 after a fall on 7/29. Pt also fell on 8/2 while in the ED. Pt found to be hypokalemic upon admission. 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 Pt is now s/p drainage of abscess and C5-6 ACDF on 06/16/21. Also noted R SI septic arthritis.  PMH: HTN, polycthemia, alcohol use.      SLP Plan  Continue with current plan of care       Recommendations  Diet recommendations: NPO Medication Administration: Via alternative means                Oral Care Recommendations: Oral care QID;Staff/trained caregiver to provide oral care Follow up Recommendations: Skilled Nursing facility SLP Visit Diagnosis: Dysphagia, pharyngeal phase (R13.13) Plan: Continue with current plan of care       GO               Kristy [name] B. Murvin Natal, [facility], CCC-SLP Speech Language Pathologist Office: [phone]  [name] 07/09/2021, 2:58 PM

## 2021-07-09 NOTE — Plan of Care (Signed)
  Problem: Acute Rehab OT Goals (only OT should resolve) Goal: Pt. Will Perform Grooming 07/09/2021 1032 by Howerton-Davis, Lasya Vetter R, OT Flowsheets (Taken 07/09/2021 1032) Pt Will Perform Grooming:  with max assist  bed level 07/09/2021 1032 by Howerton-Davis, Akera Snowberger R, OT Outcome: Not Progressing Goal: Pt. Will Perform Upper Body Bathing 07/09/2021 1032 by Howerton-Davis, Maximilien Hayashi R, OT Flowsheets (Taken 07/09/2021 1032) Pt Will Perform Upper Body Bathing:  with max assist  bed level 07/09/2021 1032 by Howerton-Davis, Jireh Vinas R, OT Outcome: Not Progressing Goal: OT Additional ADL Goal #1 07/09/2021 1032 by Howerton-Davis, Imari Reen R, OT Flowsheets (Taken 07/09/2021 1032) Additional ADL Goal #1: Pt will follow 1 step commands with 50% accuracy in non distracting environment. 07/09/2021 1032 by Howerton-Davis, Ryan Ogborn R, OT Outcome: Not Progressing   Problem: Acute Rehab OT Goals (only OT should resolve) Goal: OT Additional ADL Goal #2 07/09/2021 1032 by Howerton-Davis, Blue Winther R, OT Flowsheets (Taken 07/09/2021 1032) Additional ADL Goal #2: Pt will tolerate sitting EOB with Max A +2 x5 min in prep for ADLs. 07/09/2021 1032 by Howerton-Davis, Caili Escalera R, OT Outcome: Not Progressing Goal: OT Additional ADL Goal #3 07/09/2021 1032 by Howerton-Davis, Chirag Krueger R, OT Flowsheets (Taken 07/09/2021 1032) Additional ADL Goal #3: Pt will complete hand to mouth pattern with BUE and Mod A in prep for self-feeding. 07/09/2021 1032 by Howerton-Davis, Tijah Hane R, OT Outcome: Not Progressing

## 2021-07-09 NOTE — TOC Progression Note (Signed)
Transition of Care [facility]) - Progression Note    Patient Details  Name: Kristy [name] MRN: [id number] Date of Birth: 1945-11-30  [facility] CM/SW Contact  [name], RN Phone Number: 07/09/2021, 11:01 AM  Clinical Narrative:    CM is continuing to follow the patient for SNF placement at [facility].  The patient's G-tube is currently clogged and waiting for patient to be medically stable to transfer to the SNF facility.  CM and MSW with DTP Team will continue to follow the patient for SNF placement.   Expected Discharge Plan: Skilled Nursing Facility Barriers to Discharge: Continued Medical Work up  Expected Discharge Plan and Services Expected Discharge Plan: Skilled Nursing Facility In-house Referral: Clinical Social Work Discharge Planning Services: CM Consult Post Acute Care Choice: Skilled Nursing Facility Living arrangements for the past 2 months: Single Family Home                                       Social Determinants of Health (SDOH) Interventions    Readmission Risk Interventions No flowsheet data found.

## 2021-07-09 NOTE — Progress Notes (Signed)
Pt G-tube is clogged, unclogging was unsuccessful, Pt upper abd area on right side is visibly distended and hard to touch. Tube feeding was stopped. On call provider was notified, waiting for orders.

## 2021-07-09 NOTE — Progress Notes (Signed)
Patient seen at bedside for g-tube site evaluation due to clogging. Existing 20 Fr G tube noted to have what appears to be crushed medication in the 3 way and within the gastrostomy tube itself.  Tube was successful de clogged. Stomach contents noted in the g tube. recommend medication be crushed to fine powder and diluted in water. Following administration of food or medications the tube should be flushed until line is clear    Okay to use gastrostomy tube.  Please call IR for any questions or concerns regarding the g-tube.

## 2021-07-10 DIAGNOSIS — R1310 Dysphagia, unspecified: Secondary | ICD-10-CM

## 2021-07-10 LAB — CBC WITH DIFFERENTIAL/PLATELET
Abs Immature Granulocytes: 0.37 10*3/uL — ABNORMAL HIGH (ref 0.00–0.07)
Basophils Absolute: 0 10*3/uL (ref 0.0–0.1)
Basophils Relative: 0 %
Eosinophils Absolute: 0.8 10*3/uL — ABNORMAL HIGH (ref 0.0–0.5)
Eosinophils Relative: 9 %
HCT: 27 % — ABNORMAL LOW (ref 36.0–46.0)
Hemoglobin: 8.5 g/dL — ABNORMAL LOW (ref 12.0–15.0)
Immature Granulocytes: 4 %
Lymphocytes Relative: 10 %
Lymphs Abs: 0.9 10*3/uL (ref 0.7–4.0)
MCH: 31 pg (ref 26.0–34.0)
MCHC: 31.5 g/dL (ref 30.0–36.0)
MCV: 98.5 fL (ref 80.0–100.0)
Monocytes Absolute: 0.9 10*3/uL (ref 0.1–1.0)
Monocytes Relative: 10 %
Neutro Abs: 6 10*3/uL (ref 1.7–7.7)
Neutrophils Relative %: 67 %
Platelets: 247 10*3/uL (ref 150–400)
RBC: 2.74 MIL/uL — ABNORMAL LOW (ref 3.87–5.11)
RDW: 17.7 % — ABNORMAL HIGH (ref 11.5–15.5)
WBC: 8.9 10*3/uL (ref 4.0–10.5)
nRBC: 0 % (ref 0.0–0.2)

## 2021-07-10 LAB — BASIC METABOLIC PANEL
Anion gap: 9 (ref 5–15)
BUN: 24 mg/dL — ABNORMAL HIGH (ref 8–23)
CO2: 21 mmol/L — ABNORMAL LOW (ref 22–32)
Calcium: 8.6 mg/dL — ABNORMAL LOW (ref 8.9–10.3)
Chloride: 104 mmol/L (ref 98–111)
Creatinine, Ser: 1.03 mg/dL — ABNORMAL HIGH (ref 0.44–1.00)
GFR, Estimated: 57 mL/min — ABNORMAL LOW (ref >60)
Glucose, Bld: 114 mg/dL — ABNORMAL HIGH (ref 70–99)
Potassium: 4.2 mmol/L (ref 3.5–5.1)
Sodium: 134 mmol/L — ABNORMAL LOW (ref 135–145)

## 2021-07-10 LAB — RESP PANEL BY RT-PCR (FLU A&B, COVID) ARPGX2
Influenza A by PCR: NEGATIVE
Influenza B by PCR: NEGATIVE
SARS Coronavirus 2 by RT PCR: NEGATIVE

## 2021-07-10 LAB — GLUCOSE, CAPILLARY
Glucose-Capillary: 111 mg/dL — ABNORMAL HIGH (ref 70–99)
Glucose-Capillary: 119 mg/dL — ABNORMAL HIGH (ref 70–99)
Glucose-Capillary: 120 mg/dL — ABNORMAL HIGH (ref 70–99)
Glucose-Capillary: 123 mg/dL — ABNORMAL HIGH (ref 70–99)
Glucose-Capillary: 125 mg/dL — ABNORMAL HIGH (ref 70–99)
Glucose-Capillary: 134 mg/dL — ABNORMAL HIGH (ref 70–99)

## 2021-07-10 MED ORDER — ADULT MULTIVITAMIN LIQUID CH: 15.0000 mL | Freq: Every day | ORAL | Status: AC

## 2021-07-10 MED ORDER — FOLIC ACID 1 MG PO TABS: 1.0000 mg | ORAL_TABLET | Freq: Every day | ORAL | Status: AC

## 2021-07-10 MED ORDER — FREE WATER: 125.0000 mL | Status: DC

## 2021-07-10 MED ORDER — PROSOURCE TF PO LIQD
45.0000 mL | Freq: Two times a day (BID) | ORAL | Status: DC
  Administered 2021-07-10 – 2021-07-11 (×2): 45 mL
  Filled 2021-07-10 (×2): qty 45

## 2021-07-10 MED ORDER — ACETAMINOPHEN 160 MG/5ML PO SOLN: 650.0000 mg | Freq: Four times a day (QID) | ORAL | 0 refills | Status: DC

## 2021-07-10 MED ORDER — DICLOFENAC SODIUM 1 % EX GEL: 4.0000 g | Freq: Four times a day (QID) | CUTANEOUS | Status: AC

## 2021-07-10 MED ORDER — MIDODRINE HCL 5 MG PO TABS
5.0000 mg | ORAL_TABLET | Freq: Three times a day (TID) | ORAL | Status: DC
  Administered 2021-07-10 – 2021-07-11 (×3): 5 mg
  Filled 2021-07-10 (×3): qty 1

## 2021-07-10 MED ORDER — CEFAZOLIN SODIUM-DEXTROSE 2-4 GM/100ML-% IV SOLN: 2.0000 g | Freq: Three times a day (TID) | INTRAVENOUS | Status: DC

## 2021-07-10 MED ORDER — OXYCODONE HCL 5 MG PO TABS: 5.0000 mg | ORAL_TABLET | Freq: Four times a day (QID) | ORAL | 0 refills | Status: DC | PRN

## 2021-07-10 MED ORDER — THIAMINE HCL 100 MG PO TABS: 100.0000 mg | ORAL_TABLET | Freq: Every day | ORAL | Status: AC

## 2021-07-10 MED ORDER — NIACIN 500 MG PO TABS: 500.0000 mg | ORAL_TABLET | Freq: Every day | ORAL | Status: AC

## 2021-07-10 MED ORDER — MELATONIN 3 MG PO TABS: 3.0000 mg | ORAL_TABLET | Freq: Every evening | ORAL | 0 refills | Status: DC | PRN

## 2021-07-10 MED ORDER — MIDODRINE HCL 5 MG PO TABS: 5.0000 mg | ORAL_TABLET | Freq: Three times a day (TID) | ORAL | Status: DC

## 2021-07-10 MED ORDER — ALLOPURINOL 300 MG PO TABS: 300.0000 mg | ORAL_TABLET | Freq: Every day | ORAL | Status: DC

## 2021-07-10 MED ORDER — POLYETHYLENE GLYCOL 3350 17 G PO PACK: 17.0000 g | PACK | Freq: Every day | ORAL | 0 refills | Status: DC | PRN

## 2021-07-10 MED ORDER — OSMOLITE 1.2 CAL PO LIQD: 1000.0000 mL | ORAL | 0 refills | Status: DC

## 2021-07-10 MED ORDER — RIFAMPIN 300 MG PO CAPS: 300.0000 mg | ORAL_CAPSULE | Freq: Two times a day (BID) | ORAL | Status: DC

## 2021-07-10 NOTE — Progress Notes (Signed)
PROGRESS NOTE  Kristy [name] ZGY:[id number] DOB: 1946-09-19 DOA: 06/10/2021 PCP: Darrow Bussing, MD   LOS: 29 days   Brief  75 year old female with history of EtOH use, admitted to the hospital 8/1 with frequent falls, generalized weakness and confusion.  Upon further evaluation-she was found to have MSSA bacteremia with cervical epidural abscess/cervical discitis osteomyelitis.  ID/neurosurgery was consulted-patient is s/p C5-6 ACDF on 8/7.  Further hospital course complicated by encephalopathy.    Subjective  Awake alert-answering simple questions appropriately.  Assessment & Plan: Severe sepsis due to MSSA cervical epidural abscess/discitis/osteomyelitis: Sepsis physiology has resolved-patient is s/p I&D by neurosurgery on 8/7-recommendations by ID for IV cefazolin and rifampin until 10/2.  Echo without vegetations.  Limited exam due to pain-but seems to have generalized weakness in all 4 extremities-strength approximately 2/5.  Acute metabolic encephalopathy due to sepsis physiology: Improved.  Dysphagia: PEG tube in place (placed 8/23) seems to be tolerating PEG tube feeds.  Recurrent urinary retention: Indwelling Foley catheter in place-on Cardura-voiding trial over the next few days.  Normocytic anemia: Due to critical illness-stable for close monitoring-transfuse if < 7  Epistaxis: Resolved  AKI on CKD stage IIIb: AKI hemodynamically mediated-renal function has improved-back to baseline.  Alcohol withdrawal: Out of window for withdrawal symptoms.  Awake and alert this morning.  Alcoholic hepatitis: Resolved.  Ambulatory dysfunction/frequent falls: Due to epidural abscess/discitis/osteomyelitis-PT/OT following-continues to have significant weakness-SNF plan on discharge.  Nutrition Status: Nutrition Problem: Moderate Malnutrition Etiology: chronic illness Signs/Symptoms: moderate muscle depletion, moderate fat depletion, mild muscle depletion, mild fat  depletion Interventions: Prostat, Tube feeding    Scheduled Meds:  acetaminophen (TYLENOL) oral liquid 160 mg/5 mL  650 mg Per Tube Q6H   allopurinol  300 mg Per Tube Daily   Chlorhexidine Gluconate Cloth  6 each Topical Daily   diclofenac Sodium  4 g Topical QID   folic acid  1 mg Per Tube Daily   free water  125 mL Per Tube Q4H   heparin injection (subcutaneous)  5,000 Units Subcutaneous Q8H   midodrine  5 mg Per Tube TID WC   multivitamin  15 mL Per Tube Daily   niacin  500 mg Per Tube QHS   rifampin  300 mg Oral Q12H   thiamine  100 mg Per Tube Daily   Continuous Infusions:   ceFAZolin (ANCEF) IV 2 g (07/10/21 0306)   feeding supplement (OSMOLITE 1.2 CAL) 1,000 mL (07/09/21 1559)   PRN Meds:.antiseptic oral rinse, melatonin, morphine injection, naLOXone (NARCAN)  injection, ondansetron (ZOFRAN) IV, oxyCODONE, polyethylene glycol  Diet Orders (From admission, onward)     Start     Ordered   06/21/21 1451  Diet NPO time specified  Diet effective now        06/21/21 1450            DVT prophylaxis: heparin injection 5,000 Units Start: 07/06/21 1400 Place and maintain sequential compression device Start: 06/27/21 0526 SCDs Start: 06/11/21 0509     Code Status: Full Code  Family Communication: No family at bedside  Status is: Inpatient  Remains inpatient appropriate because:Unsafe d/c plan  Dispo: The patient is from: Home              Anticipated d/c is to: SNF              Patient currently is not medically stable to d/c.   Difficult to place patient No  Level of care: Progressive  Consultants:  ID Neurosurgery IR  Procedures:  Cervical discectomy and fusion 8/7 PEG tube placement  Antimicrobials: Acyclovir on 8/5 and 8/6 Ampicillin 8/5 through 8/8 Ceftriaxone 8/5 through 8/9 Vancomycin 8/6 through 8/8 Nafcillin 8/9 >> Rifampin 8/12 >>    Objective: Vitals:   07/09/21 1600 07/10/21 0307 07/10/21 0713 07/10/21 0900  BP: 101/60  126/65    Pulse: 99  (!) 106 (!) 106  Resp: 20  19 17   Temp: 98.9 F (37.2 C)  98.8 F (37.1 C)   TempSrc: Axillary  Oral   SpO2: 98%  98% 100%  Weight:  89.9 kg    Height:        Intake/Output Summary (Last 24 hours) at 07/10/2021 1010 Last data filed at 07/10/2021 0306 Gross per 24 hour  Intake 4055.74 ml  Output 1400 ml  Net 2655.74 ml    Filed Weights   07/07/21 0500 07/08/21 0500 07/10/21 0307  Weight: 89 kg 89.9 kg 89.9 kg    Examination: Gen Exam:Alert awake-not in any distress-frail-chronically sick appearing. HEENT:atraumatic, normocephalic Chest: B/L clear to auscultation anteriorly CVS:S1S2 regular Abdomen:soft non tender, non distended Extremities:no edema Neurology: 2/5 all over.  Limited exam. Skin: no rash    Data Reviewed: I have independently reviewed following labs and imaging studies  CBC: Recent Labs  Lab 07/06/21 0217 07/07/21 0258 07/08/21 0311 07/09/21 0206 07/10/21 0115  WBC 6.3 8.4 8.3 8.6 8.9  NEUTROABS 4.5 6.2 6.0 5.9 6.0  HGB 8.2* 8.7* 9.0* 8.2* 8.5*  HCT 26.0* 27.7* 27.9* 26.3* 27.0*  MCV 97.4 98.6 97.9 98.9 98.5  PLT 180 211 228 226 247    Basic Metabolic Panel: Recent Labs  Lab 07/04/21 0400 07/05/21 0352 07/06/21 0217 07/07/21 0258 07/08/21 0311 07/09/21 0206 07/10/21 0115  NA 136 134* 136 137 135 135 134*  K 3.5 3.8 4.4 4.2 4.4 4.2 4.2  CL 105 104 107 106 105 104 104  CO2 21* 22 20* 20* 20* 21* 21*  GLUCOSE 196* 142* 101* 111* 107* 118* 114*  BUN 29* 27* 28* 27* 27* 28* 24*  CREATININE 1.39* 1.17* 1.14* 1.02* 1.03* 1.03* 1.03*  CALCIUM 8.2* 8.4* 8.3* 8.7* 8.8* 8.7* 8.6*  MG  --   --  1.6*  --  1.8  --   --   PHOS 4.5 3.7 3.3 3.8 3.9  --   --     Liver Function Tests: Recent Labs  Lab 07/05/21 0352 07/06/21 0217 07/07/21 0258 07/08/21 0311 07/09/21 0206  AST 17 18 18 21 22   ALT 6 7 9 8 8   ALKPHOS 146* 174* 173* 165* 160*  BILITOT 1.1 1.3* 1.0 1.0 1.1  PROT 5.9* 5.8* 6.0* 6.4* 5.8*  ALBUMIN 1.8* 1.5* 1.4*  1.5* 1.3*    Coagulation Profile: No results for input(s): INR, PROTIME in the last 168 hours.  HbA1C: No results for input(s): HGBA1C in the last 72 hours. CBG: Recent Labs  Lab 07/09/21 1159 07/09/21 1619 07/09/21 2222 07/10/21 0346 07/10/21 0717  GLUCAP 82 120* 66* 123* 111*     Recent Results (from the past 240 hour(s))  Urine Culture     Status: Abnormal   Collection Time: 07/03/21  8:46 AM   Specimen: Urine, Catheterized  Result Value Ref Range Status   Specimen Description URINE, CATHETERIZED  Final   Special Requests   Final    NONE Performed at [facility], [address], [place], [facility] [address]    Culture MULTIPLE SPECIES PRESENT, SUGGEST RECOLLECTION (A)  Final   Report Status 07/05/2021  FINAL  Final  Culture, blood (Routine X 2) w Reflex to ID Panel     Status: None   Collection Time: 07/04/21  8:03 AM   Specimen: BLOOD RIGHT HAND  Result Value Ref Range Status   Specimen Description BLOOD RIGHT HAND  Final   Special Requests   Final    BOTTLES DRAWN AEROBIC ONLY Blood Culture results may not be optimal due to an inadequate volume of blood received in culture bottles   Culture   Final    NO GROWTH 5 DAYS Performed at [facility], [address], [place], [place] [address]    Report Status 07/09/2021 FINAL  Final  Culture, blood (Routine X 2) w Reflex to ID Panel     Status: None   Collection Time: 07/04/21  8:04 AM   Specimen: BLOOD RIGHT HAND  Result Value Ref Range Status   Specimen Description BLOOD RIGHT HAND  Final   Special Requests   Final    BOTTLES DRAWN AEROBIC ONLY Blood Culture results may not be optimal due to an inadequate volume of blood received in culture bottles   Culture   Final    NO GROWTH 5 DAYS Performed at [facility], [address] [place], [place] [address]    Report Status 07/09/2021 FINAL  Final     Radiology Studies: CT SHOULDER LEFT WO CONTRAST  Result Date: 07/10/2021 CLINICAL DATA:   Acute pain EXAM: CT OF THE UPPER LEFT EXTREMITY WITHOUT CONTRAST TECHNIQUE: Multidetector CT imaging of the upper left extremity was performed according to the standard protocol. COMPARISON:  None. FINDINGS: Bones/Joint/Cartilage There is no evidence of acute fracture or dislocation. The shoulder is held in internal rotation. There is moderate glenohumeral osteoarthritis with subchondral sclerosis and cystic change. There is preserved glenoid bone stock and neutral glenoid version. There is mild widening of the [facility] joint and mild degenerative change. Partially visualized ACDF hardware. Ligaments Suboptimally assessed by CT. Muscles and Tendons Grade 1 muscle atrophy of the cuff musculature. There is mineralization along the expected course of the coracoclavicular ligaments. Soft tissues No focal fluid collection. There are a few prominent left axillary lymph nodes. IMPRESSION: No acute fracture or dislocation. Moderate glenohumeral osteoarthritis. Widening of the [facility] joint with mild degenerative change and mineralization along the course of the coracoclavicular ligaments, which can be seen with chronic AC joint injury. Electronically Signed   By: Caprice Renshaw M.D.   On: 07/10/2021 08:30   CT ELBOW LEFT WO CONTRAST  Result Date: 07/10/2021 CLINICAL DATA:  Acute pain EXAM: CT OF THE UPPER LEFT EXTREMITY WITHOUT CONTRAST TECHNIQUE: Multidetector CT imaging of the upper left extremity was performed according to the standard protocol. COMPARISON:  None. FINDINGS: Bones/Joint/Cartilage There is no acute fracture or dislocation. Elbow alignment is normal. There is no significant degenerative change. There is no joint effusion. Ligaments Suboptimally assessed by CT. Muscles and Tendons No significant muscle atrophy. Soft tissues There is superficial soft tissue swelling. No focal fluid collection on noncontrast CT. IMPRESSION: Mild generalized superficial soft tissue swelling along the elbow. No acute osseous abnormality  or joint effusion. Electronically Signed   By: Caprice Renshaw M.D.   On: 07/10/2021 08:34    Pamella Pert, MD, PhD [facility]  Between 7 am - 7 pm I am available, please contact me via Amion (for emergencies) or Securechat (non urgent messages)  Between 7 pm - 7 am I am not available, please contact night coverage MD/APP via  Amion

## 2021-07-10 NOTE — Progress Notes (Signed)
  Speech Language Pathology Treatment: Dysphagia  Patient Details Name: Kristy [name] MRN: [id number] DOB: 1946/01/16 Today's Date: 07/10/2021 Time: 0950-1002 SLP Time Calculation (min) (ACUTE ONLY): 12 min  Assessment / Plan / Recommendation Clinical Impression  Pt was seen for dysphagia treatment. She was alert and mentation appears improved compared to when she was last seen by this SLP on 8/25. Pt expressed c/o back pain which prohibited optimal bed positioning to reduce aspiration risk. Rest breaks were provided and pt ultimately allowed head of bead to be elevated to 33 degrees. At this suboptimal level, pt tolerated puree boluses and ice cream via tsp without overt s/sx of aspiration and oral phase appeared functional. Pt requested that additional boluses be deferred since she wanted to rest. SLP will continue to follow pt.    HPI HPI: Pt is a 75yo female presenting to [facility] ED on 8/1 after a fall on 7/29. Pt also fell on 8/2 while in the ED. Pt found to be hypokalemic upon admission. 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 Pt is now s/p drainage of abscess and C5-6 ACDF on 06/16/21. Also noted R SI septic arthritis.  PMH: HTN, polycthemia, alcohol use.      SLP Plan  Continue with current plan of care       Recommendations  Medication Administration: Via alternative means                Oral Care Recommendations: Oral care QID;Staff/trained caregiver to provide oral care Follow up Recommendations: Skilled Nursing facility SLP Visit Diagnosis: Dysphagia, pharyngeal phase (R13.13) Plan: Continue with current plan of care       Semiyah Newgent I. Vear Clock, MS, CCC-SLP Acute Rehabilitation Services Office number [phone] Pager [phone]                [name] 07/10/2021, 10:05 AM

## 2021-07-10 NOTE — Progress Notes (Signed)
PHARMACY CONSULT NOTE FOR:  OUTPATIENT  PARENTERAL ANTIBIOTIC THERAPY (OPAT)  Indication: MSSA epidural abscess  Regimen:  -Nafcillin 12g IV q24h x2 weeks (8/7>>8/21) followed by -updated Cefazolin 2g IV q8 (8/22>>10/3)  End date: 08/12/21  IV antibiotic discharge orders are pended. To discharging provider:  please sign these orders via discharge navigator,  Select New Orders & click on the button choice - Manage This Unsigned Work.     Thank you for allowing pharmacy to be a part of this patient's care.   Joeanne Robicheaux S. Merilynn Finland, PharmD, BCPS Clinical Staff Pharmacist [email or web address] Pasty Spillers 07/10/2021, 9:55 AM

## 2021-07-10 NOTE — TOC Progression Note (Signed)
Transition of Care [facility]) - Progression Note    Patient Details  Name: Kristy [name] MRN: [id number] Date of Birth: [birth year]  [facility] CM/SW Contact  [name], RN Phone Number: 07/10/2021, 10:58 AM  Clinical Narrative:    Case management called and spoke with the daughter, [name] on the phone and she would like the patient placed at [facility] in [facility].  I called and spoke with Alphonzo Lemmings, CM at the facility and clinicals were faxed to the facility for pending bed offer and transfer to the facility tomorrow.  CM and MSW on DTP Team will continue to follow the patient for [facility] placement.   Expected Discharge Plan: Skilled Nursing Facility Barriers to Discharge: Continued Medical Work up  Expected Discharge Plan and Services Expected Discharge Plan: Skilled Nursing Facility In-house Referral: Clinical Social Work Discharge Planning Services: CM Consult Post Acute Care Choice: Skilled Nursing Facility Living arrangements for the past 2 months: Single Family Home                                       Social Determinants of Health (SDOH) Interventions    Readmission Risk Interventions No flowsheet data found.

## 2021-07-10 NOTE — Progress Notes (Signed)
[facility] PROGRESS NOTE  Kristy NayLinda T Lalley ZOX:[id number] DOB: 05/23/1946 DOA: 06/10/2021 PCP: Darrow BussingKoirala, Dibas, MD  Status: Remains inpatient appropriate because:Persistent severe electrolyte disturbances, Altered mental status, Unsafe d/c plan, IV treatments appropriate due to intensity of illness or inability to take PO, and Inpatient level of care appropriate due to severity of illness  Dispo: The patient is from: Home              Anticipated d/c is to: SNF              Patient currently is not medically stable to d/c.   Difficult to place patient No    Level of care: Progressive  Code Status: Full Family Communication: Updated daughter Cala BradfordKimberly on 8/25 regarding new UTI and infectious related acute delirium.  On 8/26 defy daughter of need for consent for transfusion. DVT prophylaxis: SCDs COVID vaccination status: Pfizer 02/06/2020 and 02/28/2020; COVID booster been 07/02/2021  Foley catheter: Yes POA: No Date inserted: 06/16/21-dc'd 8/23 but replaced on 8/24 secondary to recurrent urinary retention Still needed?/Rationale: Yes-recurrent urinary retention-needs eventual bladder training prior to removal  HPI: 75 y.o. female with a history of alcohol use who presented to the ED for frequent falls, generalized weakness, and confusion. She was found to be afebrile, hypertensive, and without hypoxia. Also had hypokalemia (K 2.6), SCr 1.88 (above baseline ~1.5), LFT elevation (AST 576, ALT 305). Ammonia normal. EtOH, salicylate, tylenol levels negative. CT head and cervical spine were reassuring, though the patient was unable to rise to seated position from laying prompting consultation for admission. The patient was given potassium supplementation, started on CIWA protocol, and admitted with note made of continued AMS. She has remained weak and confused, though on 8/5 became less interactive, retaining urine, WBC noted to be rising. Blood and urine cultures ordered, ceftriaxone given with  bacteriuria and urinary retention. MR brain has not been done yet. Neurology consulted with worsening mental status and nuchal rigidity. LP under fluoro requested, empirically treating meningitis/encephalitis.   Subjective: Awake and alert.  Still reporting persistent neck pain.  Objective: Vitals:   07/09/21 1600 07/10/21 0713  BP: 101/60 126/65  Pulse: 99 (!) 106  Resp: 20 19  Temp: 98.9 F (37.2 C) 98.8 F (37.1 C)  SpO2: 98% 98%    Intake/Output Summary (Last 24 hours) at 07/10/2021 0741 Last data filed at 07/10/2021 [id number] Gross per 24 hour  Intake 4055.74 ml  Output 1400 ml  Net 2655.74 ml   Filed Weights   07/07/21 0500 07/08/21 0500 07/10/21 0307  Weight: 89 kg 89.9 kg 89.9 kg    Exam: Constitutional: Awake and alert-mild distress as evidenced by ongoing neck pain Pulmonary: Remain clear on room air, no increased work of breathing. Cardiovascular: Rhythm/unifocal PVCs, normotensive, no peripheral edema, extremities warm to touch Abdomen: Normoactive with bowel sounds present.  PEG tube in place.  LBM 8/30 Genitourinary: Foley catheter in place draining yellow urine to bedside bag Musculoskeletal: Still has some preferential positioning of head and neck to the right but overall after receiving medications is able to move head to the midline.  Neurologic: CN 2-12 grossly intact. Sensation intact, Strength 2/5 x all 4 extremities.  Psychiatric: Alert and oriented times name and place.  Not to year or situation.  But less confused than 2 days ago.  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; meningitis ruled out UTI (POA);  - Neuro, ID, and  Neurosurgery consulted -TTE: No vegetation/endocarditis  -S/P I/D on  06/16/21 -drainage large epidural abscess -cultures positive for MSSA -Completed 2 weeks of nafcillin -Continue rifampin  for duration of treatment -Continue scheduled low-dose Skelaxin and Tylenol for neck spasm and contracture -patient has been utilizing oxycodone twice daily prn -8/29 DC Rocephin and resume Ancef on previous schedule with last doses due on 10/2  Sepsis physiology  2/2 suspected CAUTI/possible associated pyelonephritis/sepsis ruled out -Patient presented with pan sensitive staph UTI which was treated effectively -Patient required Foley catheter due to persistent AKI as well as at least one episode of acute urinary retention -Preliminary urine culture suggestive of KLEBSIELLA ORNITHINOLYTICA at final culture with multiple species -8/29 will transition back to Ancef to treat epidural abscess -Continues to have tachycardia with DVT ruled out.  Suboptimal blood pressure readings over the weekend therefore midodrine initiated -Previous abdominal pain resolved  Acute delirium -Secondary to UTI and evolving sepsis physiology -As of 8/29 almost back to baseline  Severe hypoalbuminemia -Albumin <1.0 w/ normal phosphorus -8/25 given 4 runs of 25% albumin every 6 hours with significant improvement in urine output and renal function with BUN 27 and creatinine 1.17 albumin up to 1.8.  As of 8/29 albumin down to 1.5 -Continue tube feeding as ordered  Acute metabolic encephalopathy 2/2 sepsis physiology at presentation and persistent hypernatremia/associated profound dysphagia - Neurology consulted - bedside LP unable to be performed  - MRI remarkable for osteo/epidural abscess as above -PEG tube placed 8/23 and will resume tube feedings as prior -Hypernatremia has resolved.  Presenting infection under control.  New issue of UTI with mild delirium now improving with treatment.  Recurrent urinary retention -Foley catheter discontinued on 8/23 and required I/O catheterization with large volume retained urine x 2 after removal -Unable to use Flomax since it will clog feeding tube so have changed to  Cardura -Continue Foley cath and 8/31 begin bladder training with clamping every 4 hours alternating with unclamping every 4 hour  Epistaxis Resolved -ENT/Dr. Ezzard Standing evaluated patient and cauterized bleeding vessel in right anterior nostril with silver nitrate, aspirin and heparin stopped. -No further bleeding   Macrocytosis /anemia of critical illness as well as associated chronic kidney disease -Hgb nadir 6.7 after recent epistaxis she was given 1 unit of PRBCs with improvement of hemoglobin to 8.2.  Hemoglobin down to 6.8 on 8/21 and was given an additional unit of PRBCs  -8/26 hemoglobin has drifted down to 7.1 was given 1 unit of PRBCs and as of 8/31 hemoglobin 8.5  Thrombocytopenia  Resolved -Aspirin and heparin discontinued in context of epistaxis and it is unlikely we will be able to resume anticoagulants for DVT prophylaxis  AKI on stage IIIb CKD, stabilizing:  -Baseline renal function: BUN 24 and creatinine 1.5 with GFR slightly greater than 30 - 8/29 BUN 21 and creatinine 1..03   UTI, POA, resolved:  - Covered by above abx - treatment completed   Ambulatory dysfunction with fall at home, severe weakness:  - Secondary to above - PT/OT currently recommending SNF rehabilitation once acute infectious process under better control -    Alcoholic hepatitis/regular alcohol use:  - Based on AST predominance of LFT elevations. Hepatitis panel nonreactive.  Family confirms that patient drank 2 beers per day prior to admission - No indication for prednisolone at this time. Continue trending. - AST/ALT downtrending - bilirubin increased marginally  -Per family patient basically drank 1-2 peers daily with alcohol level negative in the ER -Will require eventual cessation  counseling once mentation at baseline -Continue thiamine, folate and multiple vitamin   Hypokalemia: -8/16 potassium was low at 3.1 and after repletion has increased to 3.5 -8/22 potassium 4.2 but on 8/23 potassium  down to 3.6.  Patient also having unifocal PVCs.  Continue low-dose daily potassium per tube and follow labs.  Goal for potassium should be >/= 4.0   Severe protein caloric malnutrition  -Continue tube feeds and free water flushes via cortrack -Repeat albumin level on 8/19 was 1.2-remain down to 1.0 as of 8/23 -Status post PEG tube placement Nutrition Problem: Moderate Malnutrition Etiology: chronic illness Signs/Symptoms: moderate muscle depletion, moderate fat depletion, mild muscle depletion, mild fat depletion Interventions: Prostat, Tube feeding  HTN/grade 1 diastolic dysfunction: -Suboptimal blood pressure readings in context of possible evolving sepsis therefore Norvasc discontinued on 8/25 -Echo this admission with an EF of 65 to 70% with right 1 diastolic physiology, also noted with mild MR and AR -Was +9 L after IV fluid rehydration therefore was given one-time dose of IV Lasix over the weekend   HLD: - Hold statin for now; AST has decreased to 51 and ALT normal at 38 total bili slightly elevated at 1.3      Data Reviewed: Basic Metabolic Panel: Recent Labs  Lab 07/04/21 0400 07/05/21 0352 07/06/21 0217 07/07/21 0258 07/08/21 0311 07/09/21 0206 07/10/21 0115  NA 136 134* 136 137 135 135 134*  K 3.5 3.8 4.4 4.2 4.4 4.2 4.2  CL 105 104 107 106 105 104 104  CO2 21* 22 20* 20* 20* 21* 21*  GLUCOSE 196* 142* 101* 111* 107* 118* 114*  BUN 29* 27* 28* 27* 27* 28* 24*  CREATININE 1.39* 1.17* 1.14* 1.02* 1.03* 1.03* 1.03*  CALCIUM 8.2* 8.4* 8.3* 8.7* 8.8* 8.7* 8.6*  MG  --   --  1.6*  --  1.8  --   --   PHOS 4.5 3.7 3.3 3.8 3.9  --   --    Liver Function Tests: Recent Labs  Lab 07/05/21 0352 07/06/21 0217 07/07/21 0258 07/08/21 0311 07/09/21 0206  AST 17 18 18 21 22   ALT 6 7 9 8 8   ALKPHOS 146* 174* 173* 165* 160*  BILITOT 1.1 1.3* 1.0 1.0 1.1  PROT 5.9* 5.8* 6.0* 6.4* 5.8*  ALBUMIN 1.8* 1.5* 1.4* 1.5* 1.3*   No results for input(s): LIPASE, AMYLASE in the  last 168 hours. No results for input(s): AMMONIA in the last 168 hours. CBC: Recent Labs  Lab 07/06/21 0217 07/07/21 0258 07/08/21 0311 07/09/21 0206 07/10/21 0115  WBC 6.3 8.4 8.3 8.6 8.9  NEUTROABS 4.5 6.2 6.0 5.9 6.0  HGB 8.2* 8.7* 9.0* 8.2* 8.5*  HCT 26.0* 27.7* 27.9* 26.3* 27.0*  MCV 97.4 98.6 97.9 98.9 98.5  PLT 180 211 228 226 247   Cardiac Enzymes: No results for input(s): CKTOTAL, CKMB, CKMBINDEX, TROPONINI in the last 168 hours. BNP (last 3 results) Recent Labs    07/06/21 0217  BNP 452.3*    ProBNP (last 3 results) No results for input(s): PROBNP in the last 8760 hours.  CBG: Recent Labs  Lab 07/09/21 1159 07/09/21 1619 07/09/21 2222 07/10/21 0346 07/10/21 0717  GLUCAP 82 120* 66* 123* 111*    Recent Results (from the past 240 hour(s))  Culture, blood (Routine X 2) w Reflex to ID Panel     Status: None   Collection Time: 06/30/21  8:40 AM   Specimen: BLOOD  Result Value Ref Range Status   Specimen Description BLOOD BLOOD  RIGHT HAND  Final   Special Requests AEROBIC BOTTLE ONLY Blood Culture adequate volume  Final   Culture   Final    NO GROWTH 5 DAYS Performed at [facility], [address], [place], [place] [address]    Report Status 07/05/2021 FINAL  Final  Culture, blood (Routine X 2) w Reflex to ID Panel     Status: None   Collection Time: 06/30/21  8:40 AM   Specimen: BLOOD  Result Value Ref Range Status   Specimen Description BLOOD RIGHT ANTECUBITAL  Final   Special Requests   Final    BOTTLES DRAWN AEROBIC AND ANAEROBIC Blood Culture results may not be optimal due to an inadequate volume of blood received in culture bottles   Culture   Final    NO GROWTH 5 DAYS Performed at [facility], [address], [place], [place] [address]    Report Status 07/05/2021 FINAL  Final  Urine Culture     Status: Abnormal   Collection Time: 07/03/21  8:46 AM   Specimen: Urine, Catheterized  Result Value Ref Range Status   Specimen  Description URINE, CATHETERIZED  Final   Special Requests   Final    NONE Performed at [facility], [address] [place], [place] [address]    Culture MULTIPLE SPECIES PRESENT, SUGGEST RECOLLECTION (A)  Final   Report Status 07/05/2021 FINAL  Final  Culture, blood (Routine X 2) w Reflex to ID Panel     Status: None   Collection Time: 07/04/21  8:03 AM   Specimen: BLOOD RIGHT HAND  Result Value Ref Range Status   Specimen Description BLOOD RIGHT HAND  Final   Special Requests   Final    BOTTLES DRAWN AEROBIC ONLY Blood Culture results may not be optimal due to an inadequate volume of blood received in culture bottles   Culture   Final    NO GROWTH 5 DAYS Performed at [facility], [address], [place], [place] [address]    Report Status 07/09/2021 FINAL  Final  Culture, blood (Routine X 2) w Reflex to ID Panel     Status: None   Collection Time: 07/04/21  8:04 AM   Specimen: BLOOD RIGHT HAND  Result Value Ref Range Status   Specimen Description BLOOD RIGHT HAND  Final   Special Requests   Final    BOTTLES DRAWN AEROBIC ONLY Blood Culture results may not be optimal due to an inadequate volume of blood received in culture bottles   Culture   Final    NO GROWTH 5 DAYS Performed at [facility], [address], [place], [place] [address]    Report Status 07/09/2021 FINAL  Final      Studies: No results found.  Scheduled Meds:  acetaminophen (TYLENOL) oral liquid 160 mg/5 mL  650 mg Per Tube Q6H   allopurinol  300 mg Per Tube Daily   Chlorhexidine Gluconate Cloth  6 each Topical Daily   diclofenac Sodium  4 g Topical QID   folic acid  1 mg Per Tube Daily   free water  125 mL Per Tube Q4H   heparin injection (subcutaneous)  5,000 Units Subcutaneous Q8H   LORazepam       midodrine  5 mg Per Tube TID WC   multivitamin  15 mL Per Tube Daily   niacin  500 mg Per Tube QHS   rifampin  300 mg Oral Q12H  thiamine  100 mg Per Tube Daily   Continuous  Infusions:   ceFAZolin (ANCEF) IV 2 g (07/10/21 0306)   feeding supplement (OSMOLITE 1.2 CAL) 1,000 mL (07/09/21 1559)    Principal Problem:   Abscess in epidural space of cervical spine Active Problems:   Fall   Alcoholic hepatitis   Altered mental status   Acute delirium   Sepsis (HCC)   Malnutrition of moderate degree   Consultants: Neurology Psychiatry Infectious disease Neurosurgery  Procedures: Echocardiogram Cervical discectomy and fusion on 06/16/2021  Antibiotics: Acyclovir on 8/5 and 8/6 Ampicillin 8/5 through 8/8 Ceftriaxone 8/5 through 8/9 Vancomycin 8/6 through 8/8 Nafcillin 8/9 through 8/21 Rifampin 8/12 >> Ancef 8/22 through 8/24 Ceftriaxone 8/25 through 8/28 Ancef 8/29 >>   Time spent: 45 minutes    Junious Silk ANP  [facility] 7 am - 330 pm/M-F for direct patient care and secure chat Please refer to Amion for contact info 29  days

## 2021-07-10 NOTE — Progress Notes (Signed)
Nutrition Follow-up  DOCUMENTATION CODES:  Non-severe (moderate) malnutrition in context of chronic illness  INTERVENTION:  Continue TF via PEG: -Osmolite 1.2 cal @ 23m/hr (15681md)  -4525mrosource TF BID -Free water flushes per MD, currently 125m72mH  Provides 1952 kcals, 108 grams protein, 1279ml51me water (2029ml 40ml free water with flushes)  Recommend discontinuing liquid multivitamin and replacing with mvi with minerals (may be crushed and be administered via tube)  NUTRITION DIAGNOSIS:  Moderate Malnutrition related to chronic illness as evidenced by moderate muscle depletion, moderate fat depletion, mild muscle depletion, mild fat depletion. -- ongoing  GOAL:  Patient will meet greater than or equal to 90% of their needs -- met with TF  MONITOR:  TF tolerance, Labs, I & O's  REASON FOR ASSESSMENT:  Consult Enteral/tube feeding initiation and management  ASSESSMENT:  Pt with a PMH significant for h/o EtOH abuse who presented to the ED with frequent falls, generalized weakness, and confusion, and was admitted with acute delirium and severe sepsis 2/2 cervical epidural abscess (C2-T3/T4); cervical discitis/osteomyelitis; UTI. Pt has since become less interactive and more confused and has started retaining urine. Neurology was consulted with worsening mental status and nuchal rigidity. LP under fluoro requested, empirically treating meningitis/encephalitis.  8/06-s/p unsuccessful LP 8/07-s/p C5-C6 anterior cervical discectomy and instrumented fusion  8/08-Cortrak placed 8/12-Cortrak replaced (gastric tip per xray) 8/23-PEG placed  Bladder training initiated today per NP. Mentation improving. Remains NPO, SLP following. Tolerating TF via PEG per RN. Current TF: osmolite 1.2 @ 65ml/h52m25ml fr33mater Q4H  Medications: folvite, niacin, liquid mvi, thiamine Labs: Na 134 (L), BUN 24 (H), Cr 1.03 (H) CBGs 123-111-117-356-701.4mL x24 77mrs I/O: +11.3L since admit    Admission weight: 85.5 kg Current weight: 89.9 kg  Mild pitting edema to BUE and moderate pitting edema to BLE per RN edema assessment  Diet Order:   Diet Order             Diet NPO time specified  Diet effective now                  EDUCATION NEEDS:  Not appropriate for education at this time  Skin:  Skin Assessment: Skin Integrity Issues: Skin Integrity Issues:: Incisions Incisions: neck  Last BM:  8/29  Height:  Ht Readings from Last 1 Encounters:  06/14/21 '5\' 6"'  (1.676 m)   Weight:  Wt Readings from Last 1 Encounters:  07/10/21 89.9 kg   BMI:  Body mass index is 31.99 kg/m.  Estimated Nutritional Needs:  Kcal:  1900-2100 Protein:  100-120 grams Fluid:  >1.9L/d  Sendy Pluta AvLarkin Ina LDN (she/her/hers) RD pager number and weekend/on-call pager number located in [email or web address]

## 2021-07-11 ENCOUNTER — Other Ambulatory Visit: Payer: Self-pay

## 2021-07-11 DIAGNOSIS — M4642 Discitis, unspecified, cervical region: Secondary | ICD-10-CM

## 2021-07-11 DIAGNOSIS — A4901 Methicillin susceptible Staphylococcus aureus infection, unspecified site: Secondary | ICD-10-CM | POA: Diagnosis present

## 2021-07-11 DIAGNOSIS — R1319 Other dysphagia: Secondary | ICD-10-CM

## 2021-07-11 DIAGNOSIS — E785 Hyperlipidemia, unspecified: Secondary | ICD-10-CM | POA: Diagnosis present

## 2021-07-11 DIAGNOSIS — I5189 Other ill-defined heart diseases: Secondary | ICD-10-CM

## 2021-07-11 DIAGNOSIS — I10 Essential (primary) hypertension: Secondary | ICD-10-CM | POA: Diagnosis present

## 2021-07-11 DIAGNOSIS — N1832 Chronic kidney disease, stage 3b: Secondary | ICD-10-CM | POA: Diagnosis present

## 2021-07-11 DIAGNOSIS — R8290 Unspecified abnormal findings in urine: Secondary | ICD-10-CM | POA: Diagnosis not present

## 2021-07-11 DIAGNOSIS — Z931 Gastrostomy status: Secondary | ICD-10-CM

## 2021-07-11 DIAGNOSIS — R338 Other retention of urine: Secondary | ICD-10-CM

## 2021-07-11 LAB — CBC WITH DIFFERENTIAL/PLATELET
Abs Immature Granulocytes: 1.05 10*3/uL — ABNORMAL HIGH (ref 0.00–0.07)
Basophils Absolute: 0.1 10*3/uL (ref 0.0–0.1)
Basophils Relative: 1 %
Eosinophils Absolute: 1 10*3/uL — ABNORMAL HIGH (ref 0.0–0.5)
Eosinophils Relative: 9 %
HCT: 27.5 % — ABNORMAL LOW (ref 36.0–46.0)
Hemoglobin: 8.8 g/dL — ABNORMAL LOW (ref 12.0–15.0)
Immature Granulocytes: 9 %
Lymphocytes Relative: 8 %
Lymphs Abs: 0.9 10*3/uL (ref 0.7–4.0)
MCH: 31.2 pg (ref 26.0–34.0)
MCHC: 32 g/dL (ref 30.0–36.0)
MCV: 97.5 fL (ref 80.0–100.0)
Monocytes Absolute: 1 10*3/uL (ref 0.1–1.0)
Monocytes Relative: 9 %
Neutro Abs: 7.2 10*3/uL (ref 1.7–7.7)
Neutrophils Relative %: 64 %
Platelets: 246 10*3/uL (ref 150–400)
RBC: 2.82 MIL/uL — ABNORMAL LOW (ref 3.87–5.11)
RDW: 17.4 % — ABNORMAL HIGH (ref 11.5–15.5)
WBC: 11.2 10*3/uL — ABNORMAL HIGH (ref 4.0–10.5)
nRBC: 0 % (ref 0.0–0.2)

## 2021-07-11 LAB — GLUCOSE, CAPILLARY
Glucose-Capillary: 128 mg/dL — ABNORMAL HIGH (ref 70–99)
Glucose-Capillary: 107 mg/dL — ABNORMAL HIGH (ref 70–99)
Glucose-Capillary: 131 mg/dL — ABNORMAL HIGH (ref 70–99)

## 2021-07-11 MED ORDER — PROSOURCE TF PO LIQD: 45.0000 mL | Freq: Two times a day (BID) | ORAL | Status: DC

## 2021-07-11 NOTE — Discharge Summary (Signed)
Physician Discharge Summary  Kristy [name] ZOX:[id number] DOB: 1946-10-04 DOA: 06/10/2021  PCP: Darrow Bussing, MD  Admit date: 06/10/2021 Discharge date: 07/11/2021  Time spent: 35 minutes  Recommendations for Outpatient Follow-up:  Patient will need to follow-up with urology/Dr. Cardell Peach [phone]) after discharge.  Office will call with appointment date and time Patient will discharge to [facility] Patient will need to continue PT and OT with focus on ambulation as well as treatment of neck spasticity/torticollis. Patient has had recurrent urinary retention and will discharge with a Foley catheter in place.  See above regarding urology follow-up.  We have recently initiated bladder training with clamping/unclamping every 4 hours.  This will continue after discharge and until patient follows up with the urologist. Recommendation is to continue regular speech therapy follow-up regarding swallowing.  It is hopeful as patient slowly recovers she will be able to transition to oral diet.   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  Currently on continuous tube feedings: Osmolite 1.2 at 65 cc/h  Filed Weights   07/07/21 0500 07/08/21 0500 07/10/21 0307  Weight: 89 kg 89.9 kg 89.9 kg    History of present illness:  75 y.o. female with a history of  alcohol use who presented to the ED for frequent falls, generalized weakness, and confusion. She was found to be afebrile, hypertensive, and without hypoxia. Also had hypokalemia (K 2.6), SCr 1.88 (above baseline ~1.5), LFT elevation (AST 576, ALT 305). Ammonia normal. EtOH, salicylate, tylenol levels negative. CT head and cervical spine were reassuring, though the patient was unable to rise to seated position from laying prompting consultation for admission. The patient was given potassium supplementation, started on CIWA protocol, and admitted with note made of continued AMS. She has remained weak and confused, though on 8/5 became less interactive, retaining urine, WBC noted to be rising. Blood and urine cultures ordered, ceftriaxone given with bacteriuria and urinary retention. MR brain has not been done yet. Neurology consulted with worsening mental status and nuchal rigidity. LP under fluoro requested, empirically treating meningitis/encephalitis.   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; meningitis ruled out UTI (POA);  - Neuro, ID, and Neurosurgery evaluated this patient during hospitalization -TTE: No vegetation/endocarditis  -S/P I/D on  06/16/21 -drainage large epidural abscess -cultures positive for MSSA -Completed 2 weeks of nafcillin -Continue rifampin for duration of treatment -Continue scheduled low-dose Skelaxin and Tylenol for neck spasm and contracture -patient has been utilizing oxycodone twice daily prn -Continue Ancef with last doses due on 10/2   Sepsis physiology  2/2 suspected CAUTI/possible associated pyelonephritis/sepsis ruled out -Patient presented with MSSA UTI POA -Recent SIRS physiology with preliminary urine culture suggestive of KLEBSIELLA ORNITHINOLYTICA - final culture with multiple species -Midodrine initiated this admission due to  suboptimal blood  pressure readings.  Hopeful as patient continues to improve that this medication can be weaned and discontinued.   Acute delirium -Secondary to UTI and evolving sepsis physiology -As of 8/29 almost back to baseline   Severe hypoalbuminemia -Albumin <1.0 w/ normal phosphorus -8/25 given 4 runs of 25% albumin every 6 hours with significant improvement in urine output and renal function with BUN 27 and creatinine 1.17 albumin up to 1.8.  As of 8/29 albumin down to 1.5 -Continue tube feeding as ordered   Acute metabolic encephalopathy 2/2 sepsis physiology at presentation and persistent hypernatremia/associated profound dysphagia - Neurology consulted - bedside LP unable to be performed  - MRI remarkable for osteo/epidural abscess as above -PEG tube placed 8/23 and will resume tube feedings as prior -Hypernatremia has resolved.  Presenting infection under control.   -As of date of discharge mentation almost back to baseline but still remains highly deconditioned and very sleepy at times   Recurrent urinary retention -Foley catheter discontinued on 8/23 and required I/O catheterization with large volume retained urine x 2 Chane after I/O catheterizations -Unable to use Flomax since it will clog feeding tube so have changed to Cardura -Continue Foley cath -8/31 began bladder training with clamping every 4 hours alternating with unclamping every 4 hours.  Ambulatory referral sent to urologist in [place].   Severe Hypoalbunemia/UE edema -Wrap w/ Kerlix due to oozing -Elevate extremities -Pt reports pain w/ PROM but no redness or signs of infection  Epistaxis Resolved -ENT/Dr. Ezzard Standing evaluated patient and cauterized bleeding vessel in right anterior nostril with silver nitrate, aspirin and heparin stopped. -No further bleeding    Macrocytosis /anemia of critical illness as well as associated chronic kidney disease -Hgb nadir 6.7 secondary to recent epistaxis -Was given a  total of 2 units PRBCs during this admission - 8/31 hemoglobin 8.5   Thrombocytopenia  Resolved -Aspirin and heparin discontinued in context of epistaxis and it is unlikely we will be able to resume anticoagulants for DVT prophylaxis   AKI on stage IIIb CKD, stabilizing:  -Baseline renal function: BUN 24 and creatinine 1.5 with GFR slightly greater than 30 - 8/31 BUN 21 and creatinine 1..03   UTI, POA, resolved:  - Covered by above abx - treatment completed   Ambulatory dysfunction with fall at home, severe weakness:  - Secondary to above - PT/OT currently recommending SNF rehabilitation once acute infectious process under better control    Alcoholic hepatitis/regular alcohol use:  - Based on AST predominance of LFT elevations. Hepatitis panel nonreactive.  Family confirms that patient drank no more than 2 beers per day prior to admission - No indication for prednisolone at this time. Continue trending. -LFTs have normalized -Treated with thiamine, folate and multiple vitamin   Hypokalemia: Resolved  -Potassium 4.2 as of 8/31 - Goal for potassium should be >/= 4.0   Severe protein caloric malnutrition  -Continue tube feeds and free water flushes via cortrack -Albumin 1.3 as of 8/30 -Status post PEG tube placement Nutrition Problem: Moderate Malnutrition Etiology: chronic illness Signs/Symptoms: moderate muscle depletion, moderate fat depletion, mild muscle depletion, mild fat depletion Interventions: Prostat, Tube feeding   HTN/grade 1 diastolic dysfunction: -Suboptimal blood pressure readings in context of possible evolving sepsis therefore Norvasc discontinued on 8/25 -Echo this admission with an EF of 65 to 70% with right 1 diastolic physiology, also noted with mild MR and AR   HLD: - Hold statin for now; AST has decreased to 51 and ALT normal at 38 total bili  slightly elevated at 1.3  Procedures: Echocardiogram Cervical discectomy and fusion on  06/16/2021  Consultations: Neurology Psychiatry Infectious disease Neurosurgery  Antibiotics: Acyclovir on 8/5 and 8/6 Ampicillin 8/5 through 8/8 Ceftriaxone 8/5 through 8/9 Vancomycin 8/6 through 8/8 Nafcillin 8/9 through 8/21 Rifampin 8/12 >> Ancef 8/22 through 8/24 Ceftriaxone 8/25 through 8/28 Ancef 8/29 >>  Discharge Exam: Vitals:   07/11/21 0529 07/11/21 0811  BP:  (!) 157/83  Pulse: 94 100  Resp: 18 17  Temp:  98.2 F (36.8 C)  SpO2: 100% 100%   Constitutional: Awake and alert-mild distress as evidenced by ongoing neck pain Pulmonary: Remain clear on room air, no increased work of breathing. Cardiovascular: Rhythm/unifocal PVCs, normotensive, continues w/ marked edema B UE's with oozing of serous fluid-hands puffy, extremities warm to touch Abdomen: Normoactive with bowel sounds present.  PEG tube in place.  LBM 8/30 Genitourinary: Foley catheter in place draining yellow urine to bedside bag Musculoskeletal: Still has some preferential positioning of head and neck to the right but overall after receiving medications is able to move head to the midline.  Neurologic: CN 2-12 grossly intact. Sensation intact, Strength 2/5 x all 4 extremities.  Psychiatric: Alert and oriented times name and place.  Not to year or situation.  But less confused than 2 days ago.  Discharge Instructions   Discharge Instructions     Ambulatory referral to Urology   Complete by: As directed    Patient discharging with Foley catheter in place due to recurrent urinary retention.  Currently on bladder training program with clamping/unclamping every 4 hours.  Will discharge to [facility] in [place] as opposed to previous message stating that she would discharge to an SNF in [place].   Change dressing (specify)   Complete by: As directed    Patient has significant bilateral upper extremity edema with weeping.  This is secondary to significant hypoalbuminemia.  Currently we are  applying gauze/Kerlix dressing daily and as needed for saturation.  We are also elevating both arms on pillows.   Diet general   Complete by: As directed    Patient is n.p.o. except for tube feedings.  Recommendation is to continue aggressive speech therapy to see if patient can eventually transition to oral diet   Discharge instructions   Complete by: As directed    Continue physical therapy, Occupational Therapy and speech therapy regarding swallowing   Increase activity slowly   Complete by: As directed       Allergies as of 07/11/2021   No Known Allergies      Medication List     STOP taking these medications    aspirin 81 MG chewable tablet   rosuvastatin 20 MG tablet Commonly known as: CRESTOR   valsartan-hydrochlorothiazide 160-25 MG tablet Commonly known as: DIOVAN-HCT       TAKE these medications    acetaminophen 160 MG/5ML solution Commonly known as: TYLENOL Place 20.3 mLs (650 mg total) into feeding tube every 6 (six) hours.   allopurinol 300 MG tablet Commonly known as: ZYLOPRIM Place 1 tablet (300 mg total) into feeding tube daily. What changed: how to take this   ceFAZolin 2-4 GM/100ML-% IVPB Commonly known as: ANCEF Inject 100 mLs (2 g total) into the vein every 8 (eight) hours.   diclofenac Sodium 1 % Gel Commonly known as: VOLTAREN Apply 4 g topically 4 (four) times daily. To neck   feeding supplement (OSMOLITE 1.2 CAL) Liqd Place 1,000 mLs into feeding tube continuous.   feeding supplement (PROSource  TF) liquid Place 45 mLs into feeding tube 2 (two) times daily.   folic acid 1 MG tablet Commonly known as: FOLVITE Place 1 tablet (1 mg total) into feeding tube daily.   free water Soln Place 125 mLs into feeding tube every 4 (four) hours.   melatonin 3 MG Tabs tablet Place 1 tablet (3 mg total) into feeding tube at bedtime as needed.   midodrine 5 MG tablet Commonly known as: PROAMATINE Place 1 tablet (5 mg total) into feeding tube 3  (three) times daily with meals.   multivitamin Liqd Place 15 mLs into feeding tube daily.   niacin 500 MG tablet Place 1 tablet (500 mg total) into feeding tube at bedtime.   oxyCODONE 5 MG immediate release tablet Commonly known as: Oxy IR/ROXICODONE Take 1 tablet (5 mg total) by mouth every 6 (six) hours as needed for breakthrough pain or severe pain.   polyethylene glycol 17 g packet Commonly known as: MIRALAX / GLYCOLAX Place 17 g into feeding tube daily as needed for mild constipation or severe constipation.   rifampin 300 MG capsule Commonly known as: RIFADIN Take 1 capsule (300 mg total) by mouth every 12 (twelve) hours.   thiamine 100 MG tablet Place 1 tablet (100 mg total) into feeding tube daily.               Discharge Care Instructions  (From admission, onward)           Start     Ordered   07/11/21 0000  Change dressing (specify)       Comments: Patient has significant bilateral upper extremity edema with weeping.  This is secondary to significant hypoalbuminemia.  Currently we are applying gauze/Kerlix dressing daily and as needed for saturation.  We are also elevating both arms on pillows.   07/11/21 0938           No Known Allergies    The results of significant diagnostics from this hospitalization (including imaging, microbiology, ancillary and laboratory) are listed below for reference.    Significant Diagnostic Studies: CT ABDOMEN WO CONTRAST  Result Date: 06/27/2021 CLINICAL DATA:  assess anatomy for gastrostomy tube EXAM: CT ABDOMEN WITHOUT CONTRAST TECHNIQUE: Multidetector CT imaging of the abdomen was performed following the standard protocol without IV contrast. COMPARISON:  None. FINDINGS: Lower chest: Small bilateral pleural effusions with bibasilar atelectasis. Hepatobiliary: No focal liver abnormality is seen. No gallstones, gallbladder wall thickening, or biliary dilatation. Pancreas: Unremarkable. No pancreatic ductal dilatation  or surrounding inflammatory changes. Spleen: Normal in size without focal abnormality. Adrenals/Urinary Tract: Adrenal glands are unremarkable. No hydronephrosis. There is a 3.0 cm low-density lesion in the left kidney which is compatible with a cyst. Stomach/Bowel: The stomach is within normal limits. There is a feeding tube in place with tip in the distal stomach. There is no other bowel interposed between the stomach and the abdominal wall, though the transverse colon is in close proximity. There is no evidence of bowel obstruction. The appendix is normal. Scattered colonic diverticuli. No bowel wall thickening or adjacent inflammatory changes. Vascular/Lymphatic: Aortoiliac atherosclerotic calcifications. No AAA. No lymphadenopathy. Other: No abdominal wall hernia or abnormality. No abdominal ascites. Musculoskeletal: No acute osseous abnormality. No suspicious lytic or blastic lesions. Multilevel degenerative changes of the spine, with severe lower lumbar predominant facet arthropathy. Likely chronic right SI joint subchondral erosive change, with sclerotic margins. IMPRESSION: No acute abnormality in the abdomen. Feeding tube is in place with tip in the distal stomach. No other  bowel is interposed between the stomach and the anterior abdominal wall, though the transverse colon is in close proximity. Small bilateral pleural effusions with adjacent bibasilar atelectasis. Electronically Signed   By: Caprice Renshaw M.D.   On: 06/27/2021 16:18   DG Cervical Spine 1 View  Result Date: 06/16/2021 CLINICAL DATA:  Neck pain EXAM: DG CERVICAL SPINE - 1 VIEW; DG C-ARM 1-60 MIN COMPARISON:  MRI 06/16/2021 FINDINGS: Single view intraoperative radiograph of the cervical spine demonstrates cervical spine from the occiput foot through C5-6. C5-6 anterior cervical discectomy and fusion with instrumentation has been performed. Normal alignment. Ray-Tec is seen within the soft tissues anterior to the fusion. Endotracheal tube is  in place. Fluoroscopy time: 3 seconds Dose: 0.25 mGy Images: 1 IMPRESSION: ACDF C5-6.  Ray-Tec in place within the anterior soft tissues. Electronically Signed   By: Helyn Numbers MD   On: 06/16/2021 15:04   CT HEAD WO CONTRAST ( )  Result Date: 06/14/2021 CLINICAL DATA:  Mental status changes. EXAM: CT HEAD WITHOUT CONTRAST TECHNIQUE: Contiguous axial images were obtained from the base of the skull through the vertex without intravenous contrast. COMPARISON:  Head CT 06/2021 FINDINGS: Brain: Stable age related cerebral atrophy, ventriculomegaly and periventricular white matter disease. No extra-axial fluid collections are identified. No CT findings for acute hemispheric infarction or intracranial hemorrhage. No mass lesions. The brainstem and cerebellum are normal. Vascular: Stable vascular calcifications.  No hyperdense vessels. Skull: No skull fracture or bone lesions. Sinuses/Orbits: The paranasal sinuses and mastoid air cells are clear. The globes are intact. Other: No scalp lesions or scalp hematoma. IMPRESSION: 1. Stable age related cerebral atrophy, ventriculomegaly and periventricular white matter disease. 2. No acute intracranial findings or mass lesions. Electronically Signed   By: Rudie Meyer M.D.   On: 06/14/2021 17:22   MR BRAIN WO CONTRAST  Result Date: 06/15/2021 CLINICAL DATA:  Mental status change, unknown cause. Concern for meningitis. EXAM: MRI HEAD WITHOUT CONTRAST TECHNIQUE: Multiplanar, multiecho pulse sequences of the brain and surrounding structures were obtained without intravenous contrast. COMPARISON:  Head CT 06/14/2021 FINDINGS: Brain: There is no evidence of an acute infarct, intracranial hemorrhage, mass, midline shift, or extra-axial fluid collection. There is mild-to-moderate cerebral atrophy. There is confluent T2 hyperintensity along the margins of the occipital and temporal horns of the lateral ventricles suspicious for mild edema. Patchy T2 hyperintensity in the  periatrial regions more resembles chronic small vessel ischemia. No definite debris is evident in the ventricles. No sulcal FLAIR signal abnormality is identified. Vascular: Major intracranial vascular flow voids are preserved. Skull and upper cervical spine: No suspicious calvarial lesion. Abnormal intermediate T1 signal material in the midline and left lateral ventral spinal canal at C2-3, possibly epidural. Sinuses/Orbits: Paranasal sinuses and mastoid air cells are clear. Unremarkable orbits. Other: None. IMPRESSION: 1. Confluent periventricular T2 hyperintensity, indeterminate. Given the clinical concern for infection, this could reflect mild edema from ventriculitis although no ventricular debris is evident and there are no other findings of meningitis on this unenhanced study. 2. Mild chronic small vessel ischemic disease elsewhere in the cerebral white matter. 3. No acute infarct. 4. Abnormal material in the included upper cervical spinal canal of uncertain etiology. Cervical spine MRI is recommended for further evaluation. These results will be called to the ordering clinician or representative by the Radiologist Assistant, and communication documented in the PACS or [employer]. Electronically Signed   By: Sebastian Ache M.D.   On: 06/15/2021 19:16   MR CERVICAL SPINE WO CONTRAST  Result Date: 06/16/2021 CLINICAL DATA:  Cervical radiculopathy. Infection suspected. Elevated CRP and leukocytosis. EXAM: MRI CERVICAL, THORACIC AND LUMBAR SPINE WITHOUT CONTRAST TECHNIQUE: Multiplanar and multiecho pulse sequences of the cervical spine, to include the craniocervical junction and cervicothoracic junction, and thoracic and lumbar spine, were obtained without intravenous contrast. COMPARISON:  Cervical spine CT from admission FINDINGS: MRI CERVICAL SPINE FINDINGS Alignment: Exaggerated cervical lordosis with mild C7-T1 anterolisthesis. Vertebrae: Subtle marrow edema at the C5-C7 vertebral levels. There is  disc T2 hyperintensity at C5-6 and C6-7 with extensive prevertebral edema and posterior-lateral paravertebral edema. Cord: Multilevel deformity from epidural collection extending from approximately C2-T4, more inferiorly extensive posteriorly. No detected cord edema Posterior Fossa, vertebral arteries, paraspinal tissues: As above Disc levels: Given patient motion, evaluation of degenerative disease is limited. There is diffuse degenerative facet spurring. Milder disc degeneration with generalized narrowing and bulging. MRI THORACIC SPINE FINDINGS Alignment:  Exaggerated thoracic kyphosis. Vertebrae: T1-2 disc and adjacent endplate hyperintensity. Cord: Normal signal and morphology. Epidural collection continues from the cervical levels into the upper thoracic spine, posteriorly terminating at approximately T3-4. Paraspinal and other soft tissues: Paravertebral edema at the upper thoracic levels related to cervical spine disease. Disc levels: Generalize bridging osteophytes and ankylosis. Upper thoracic degenerative facet spurring. No canal or foraminal impingement MRI LUMBAR SPINE FINDINGS Segmentation:  5 lumbar type vertebrae Alignment:  Mild degenerative anterolisthesis at L4-5 Vertebrae: No visible fracture, discitis, or aggressive bone lesion. Conus medullaris and cauda equina: Conus extends to the T12-L1 level. Conus and cauda equina appear normal. Paraspinal and other soft tissues: Cystic appearing spaces in the presacral space on the right intimately associated with piriformis. The sacroiliac joints are largely on covered. No visible adjacent marrow edema Disc levels: T12- L1: Disc narrowing and bulging.  Mild facet spurring L1-L2: Disc narrowing and bulging.  Mild facet spurring L2-L3: Disc narrowing and bulging with degenerative facet spurring. Left subarticular recess stenosis that could affect the L3 nerve root L3-L4: Disc narrowing and bulging with degenerative facet spurring L4-L5: Facet  osteoarthritis with spurring and anterolisthesis. The disc is narrowed and bulging. Moderate spinal stenosis. L5-S1:Facet osteoarthritis with bulky spurring. Mild disc space narrowing. Case discussed with Dr. Amada Jupiter. IMPRESSION: 1. 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 2. Suspected inflammation/collection partially covered in the right pelvis along the piriformis, possible septic arthritis of the right sacroiliac joint which is only minimally covered. Recommend dedicated pelvis MRI. Electronically Signed   By: Marnee Spring M.D.   On: 06/16/2021 04:44   MR THORACIC SPINE WO CONTRAST  Result Date: 06/16/2021 CLINICAL DATA:  Cervical radiculopathy. Infection suspected. Elevated CRP and leukocytosis. EXAM: MRI CERVICAL, THORACIC AND LUMBAR SPINE WITHOUT CONTRAST TECHNIQUE: Multiplanar and multiecho pulse sequences of the cervical spine, to include the craniocervical junction and cervicothoracic junction, and thoracic and lumbar spine, were obtained without intravenous contrast. COMPARISON:  Cervical spine CT from admission FINDINGS: MRI CERVICAL SPINE FINDINGS Alignment: Exaggerated cervical lordosis with mild C7-T1 anterolisthesis. Vertebrae: Subtle marrow edema at the C5-C7 vertebral levels. There is disc T2 hyperintensity at C5-6 and C6-7 with extensive prevertebral edema and posterior-lateral paravertebral edema. Cord: Multilevel deformity from epidural collection extending from approximately C2-T4, more inferiorly extensive posteriorly. No detected cord edema Posterior Fossa, vertebral arteries, paraspinal tissues: As above Disc levels: Given patient motion, evaluation of degenerative disease is limited. There is diffuse degenerative facet spurring. Milder disc degeneration with generalized narrowing and bulging. MRI  THORACIC SPINE FINDINGS Alignment:  Exaggerated thoracic kyphosis. Vertebrae:  T1-2 disc and adjacent endplate hyperintensity. Cord: Normal signal and morphology. Epidural collection continues from the cervical levels into the upper thoracic spine, posteriorly terminating at approximately T3-4. Paraspinal and other soft tissues: Paravertebral edema at the upper thoracic levels related to cervical spine disease. Disc levels: Generalize bridging osteophytes and ankylosis. Upper thoracic degenerative facet spurring. No canal or foraminal impingement MRI LUMBAR SPINE FINDINGS Segmentation:  5 lumbar type vertebrae Alignment:  Mild degenerative anterolisthesis at L4-5 Vertebrae: No visible fracture, discitis, or aggressive bone lesion. Conus medullaris and cauda equina: Conus extends to the T12-L1 level. Conus and cauda equina appear normal. Paraspinal and other soft tissues: Cystic appearing spaces in the presacral space on the right intimately associated with piriformis. The sacroiliac joints are largely on covered. No visible adjacent marrow edema Disc levels: T12- L1: Disc narrowing and bulging.  Mild facet spurring L1-L2: Disc narrowing and bulging.  Mild facet spurring L2-L3: Disc narrowing and bulging with degenerative facet spurring. Left subarticular recess stenosis that could affect the L3 nerve root L3-L4: Disc narrowing and bulging with degenerative facet spurring L4-L5: Facet osteoarthritis with spurring and anterolisthesis. The disc is narrowed and bulging. Moderate spinal stenosis. L5-S1:Facet osteoarthritis with bulky spurring. Mild disc space narrowing. Case discussed with Dr. Amada Jupiter. IMPRESSION: 1. 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 2. Suspected inflammation/collection partially covered in the right pelvis along the piriformis, possible septic arthritis of the right sacroiliac joint which is only minimally covered. Recommend dedicated pelvis MRI. Electronically  Signed   By: Marnee Spring M.D.   On: 06/16/2021 04:44   MR LUMBAR SPINE WO CONTRAST  Result Date: 06/16/2021 CLINICAL DATA:  Cervical radiculopathy. Infection suspected. Elevated CRP and leukocytosis. EXAM: MRI CERVICAL, THORACIC AND LUMBAR SPINE WITHOUT CONTRAST TECHNIQUE: Multiplanar and multiecho pulse sequences of the cervical spine, to include the craniocervical junction and cervicothoracic junction, and thoracic and lumbar spine, were obtained without intravenous contrast. COMPARISON:  Cervical spine CT from admission FINDINGS: MRI CERVICAL SPINE FINDINGS Alignment: Exaggerated cervical lordosis with mild C7-T1 anterolisthesis. Vertebrae: Subtle marrow edema at the C5-C7 vertebral levels. There is disc T2 hyperintensity at C5-6 and C6-7 with extensive prevertebral edema and posterior-lateral paravertebral edema. Cord: Multilevel deformity from epidural collection extending from approximately C2-T4, more inferiorly extensive posteriorly. No detected cord edema Posterior Fossa, vertebral arteries, paraspinal tissues: As above Disc levels: Given patient motion, evaluation of degenerative disease is limited. There is diffuse degenerative facet spurring. Milder disc degeneration with generalized narrowing and bulging. MRI THORACIC SPINE FINDINGS Alignment:  Exaggerated thoracic kyphosis. Vertebrae: T1-2 disc and adjacent endplate hyperintensity. Cord: Normal signal and morphology. Epidural collection continues from the cervical levels into the upper thoracic spine, posteriorly terminating at approximately T3-4. Paraspinal and other soft tissues: Paravertebral edema at the upper thoracic levels related to cervical spine disease. Disc levels: Generalize bridging osteophytes and ankylosis. Upper thoracic degenerative facet spurring. No canal or foraminal impingement MRI LUMBAR SPINE FINDINGS Segmentation:  5 lumbar type vertebrae Alignment:  Mild degenerative anterolisthesis at L4-5 Vertebrae: No visible  fracture, discitis, or aggressive bone lesion. Conus medullaris and cauda equina: Conus extends to the T12-L1 level. Conus and cauda equina appear normal. Paraspinal and other soft tissues: Cystic appearing spaces in the presacral space on the right intimately associated with piriformis. The sacroiliac joints are largely on covered. No visible adjacent marrow edema Disc levels: T12-  L1: Disc narrowing and bulging.  Mild facet spurring L1-L2: Disc narrowing and bulging.  Mild facet spurring L2-L3: Disc narrowing and bulging with degenerative facet spurring. Left subarticular recess stenosis that could affect the L3 nerve root L3-L4: Disc narrowing and bulging with degenerative facet spurring L4-L5: Facet osteoarthritis with spurring and anterolisthesis. The disc is narrowed and bulging. Moderate spinal stenosis. L5-S1:Facet osteoarthritis with bulky spurring. Mild disc space narrowing. Case discussed with Dr. Amada Jupiter. IMPRESSION: 1. 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 2. Suspected inflammation/collection partially covered in the right pelvis along the piriformis, possible septic arthritis of the right sacroiliac joint which is only minimally covered. Recommend dedicated pelvis MRI. Electronically Signed   By: Marnee Spring M.D.   On: 06/16/2021 04:44   IR GASTROSTOMY TUBE MOD SED  Result Date: 07/02/2021 INDICATION: 75 year old female with dysphagia. She requires placement of a percutaneous gastrostomy tube. EXAM: Fluoroscopically guided placement of percutaneous pull-through gastrostomy tube Interventional Radiologist:  Sterling Big, MD MEDICATIONS: In patient currently receiving Ancef every 12 hours. No additional antibiotic prophylaxis administered. ANESTHESIA/SEDATION: Versed 1.5 mg IV; Fentanyl 75 mcg IV Moderate Sedation Time:  13 minutes The patient was continuously monitored  during the procedure by the interventional radiology nurse under my direct supervision. CONTRAST:  13mL OMNIPAQUE IOHEXOL 240 MG/ML SOLN FLUOROSCOPY TIME:  Fluoroscopy Time: 2 minutes 12 seconds (13 mGy). COMPLICATIONS: None immediate. PROCEDURE: Informed written consent was obtained from the patient after a thorough discussion of the procedural risks, benefits and alternatives. All questions were addressed. Maximal Sterile Barrier Technique was utilized including caps, mask, sterile gowns, sterile gloves, sterile drape, hand hygiene and skin antiseptic. A timeout was performed prior to the initiation of the procedure. Maximal barrier sterile technique utilized including caps, mask, sterile gowns, sterile gloves, large sterile drape, hand hygiene, and chlorhexadine skin prep. An angled catheter was advanced over a wire under fluoroscopic guidance through the nose, down the esophagus and into the body of the stomach. The stomach was then insufflated with several 100 ml of air. Fluoroscopy confirmed location of the gastric bubble, as well as inferior displacement of the barium stained colon. Under direct fluoroscopic guidance, a single T-tack was placed, and the anterior gastric wall drawn up against the anterior abdominal wall. Percutaneous access was then obtained into the mid gastric body with an 18 gauge sheath needle. Aspiration of air, and injection of contrast material under fluoroscopy confirmed needle placement. An Amplatz wire was advanced in the gastric body and the access needle exchanged for a 9-French vascular sheath. A snare device was advanced through the vascular sheath and an Amplatz wire advanced through the angled catheter. The Amplatz wire was successfully snared and this was pulled up through the esophagus and out the mouth. A 20-French Burnell Blanks MIC-PEG tube was then connected to the snare and pulled through the mouth, down the esophagus, into the stomach and out to the anterior abdominal  wall. Hand injection of contrast material confirmed intragastric location. The T-tack retention suture was then cut. The pull through peg tube was then secured with the external bumper and capped. The patient will be observed for several hours with the newly placed tube on low wall suction to evaluate for any post procedure complication. The patient tolerated the procedure well, there is no immediate complication. IMPRESSION: Successful placement of a 20 French pull through gastrostomy tube. Electronically Signed   By: Malachy Moan  M.D.   On: 07/02/2021 16:01   CT SHOULDER LEFT WO CONTRAST  Result Date: 07/10/2021 CLINICAL DATA:  Acute pain EXAM: CT OF THE UPPER LEFT EXTREMITY WITHOUT CONTRAST TECHNIQUE: Multidetector CT imaging of the upper left extremity was performed according to the standard protocol. COMPARISON:  None. FINDINGS: Bones/Joint/Cartilage There is no evidence of acute fracture or dislocation. The shoulder is held in internal rotation. There is moderate glenohumeral osteoarthritis with subchondral sclerosis and cystic change. There is preserved glenoid bone stock and neutral glenoid version. There is mild widening of the [facility] joint and mild degenerative change. Partially visualized ACDF hardware. Ligaments Suboptimally assessed by CT. Muscles and Tendons Grade 1 muscle atrophy of the cuff musculature. There is mineralization along the expected course of the coracoclavicular ligaments. Soft tissues No focal fluid collection. There are a few prominent left axillary lymph nodes. IMPRESSION: No acute fracture or dislocation. Moderate glenohumeral osteoarthritis. Widening of the [facility] joint with mild degenerative change and mineralization along the course of the coracoclavicular ligaments, which can be seen with chronic AC joint injury. Electronically Signed   By: Caprice Renshaw M.D.   On: 07/10/2021 08:30   CT ELBOW LEFT WO CONTRAST  Result Date: 07/10/2021 CLINICAL DATA:  Acute pain EXAM: CT OF THE  UPPER LEFT EXTREMITY WITHOUT CONTRAST TECHNIQUE: Multidetector CT imaging of the upper left extremity was performed according to the standard protocol. COMPARISON:  None. FINDINGS: Bones/Joint/Cartilage There is no acute fracture or dislocation. Elbow alignment is normal. There is no significant degenerative change. There is no joint effusion. Ligaments Suboptimally assessed by CT. Muscles and Tendons No significant muscle atrophy. Soft tissues There is superficial soft tissue swelling. No focal fluid collection on noncontrast CT. IMPRESSION: Mild generalized superficial soft tissue swelling along the elbow. No acute osseous abnormality or joint effusion. Electronically Signed   By: Caprice Renshaw M.D.   On: 07/10/2021 08:34   DG CHEST PORT 1 VIEW  Result Date: 06/30/2021 CLINICAL DATA:  Chest xray ordered because of fever. EXAM: PORTABLE CHEST - 1 VIEW COMPARISON:  06/14/2021 FINDINGS: Increase in ill-defined interstitial and airspace opacities in the lung bases, left greater than right. Heart size and mediastinal contours are within normal limits. Blunting of left lateral costophrenic angle suggesting small effusion. No pneumothorax. Surgical fixation hardware partially seen. Feeding tube extends at least as far as the stomach, tip not seen. IMPRESSION: Worsening bibasilar infiltrates or edema, left greater than right. Possible small left effusion. Electronically Signed   By: Corlis Leak M.D.   On: 06/30/2021 12:16   DG CHEST PORT 1 VIEW  Result Date: 06/14/2021 CLINICAL DATA:  Leukocytosis EXAM: PORTABLE CHEST 1 VIEW COMPARISON:  06/11/2020, 05/14/2008 FINDINGS: The heart size and mediastinal contours are within normal limits. Both lungs are clear. The visualized skeletal structures are unremarkable. IMPRESSION: No active disease. Electronically Signed   By: Jasmine Pang M.D.   On: 06/14/2021 18:30   DG Abd Portable 1V  Result Date: 06/21/2021 CLINICAL DATA:  Nasogastric tube placement. EXAM: PORTABLE  ABDOMEN - 1 VIEW COMPARISON:  None. FINDINGS: The bowel gas pattern is normal. Feeding tube tip is seen in expected position of distal stomach. No radio-opaque calculi or other significant radiographic abnormality are seen. IMPRESSION: Feeding tube tip seen in expected position of distal stomach. Electronically Signed   By: Lupita Raider M.D.   On: 06/21/2021 14:39   DG Abd Portable 1V  Result Date: 06/21/2021 CLINICAL DATA:  Evaluate feeding tube placement EXAM: PORTABLE ABDOMEN - 1  VIEW COMPARISON:  06/17/2021 FINDINGS: The feeding tube tip is 8.2 cm below the GE junction in the expected location of the gastric fundus. Normal bowel gas pattern. IMPRESSION: Feeding tube tip in the gastric fundus. Electronically Signed   By: Signa Kell M.D.   On: 06/21/2021 11:11   DG Abd Portable 1V  Result Date: 06/17/2021 CLINICAL DATA:  Feeding tube placement. EXAM: PORTABLE ABDOMEN - 1 VIEW COMPARISON:  None. FINDINGS: Tip of the weighted enteric tube in the right upper quadrant in the region of the distal stomach. There is air within nondilated bowel loops in the upper abdomen. No obvious free air. IMPRESSION: Tip of the weighted enteric tube in the right upper quadrant in the region of the distal stomach. Electronically Signed   By: Narda Rutherford M.D.   On: 06/17/2021 15:01   DG Swallowing Func-Speech Pathology  Result Date: 06/24/2021 Table formatting from the original result was not included. Objective Swallowing Evaluation: Type of Study: MBS-Modified Barium Swallow Study  Patient Details Name: Kristy [name] MRN: [id number] Date of Birth: [birth year] Today's Date: 06/24/2021 Time: SLP Start Time (ACUTE ONLY): 1230 -SLP Stop Time (ACUTE ONLY): 1300 SLP Time Calculation (min) (ACUTE ONLY): 30 min Past Medical History: No past medical history on file. Past Surgical History: Past Surgical History: Procedure Laterality Date  ANTERIOR CERVICAL DECOMP/DISCECTOMY FUSION N/A 06/16/2021  Procedure: ANTERIOR CERVICAL  DISCECTOMY FUSION Cervical five -Cervical six, Evacuation of Epidural Abscess;  Surgeon: Jadene Pierini, MD;  Location: [facility] OR;  Service: Neurosurgery;  Laterality: N/A; HPI: Pt is a 75yo female presenting [facility] ED on 8/1 after a fall on 7/29. Pt also fell on 8/2 while in the ED. Pt found to be hypokalemic upon admission. 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 Pt is now s/p drainage of abscess and C5-6 ACDF on 06/16/21. Also noted R SI septic arthritis.  PMH: HTN, polycthemia, alcohol use.  Subjective: alerts to name, responds to questions Assessment / Plan / Recommendation CHL IP CLINICAL IMPRESSIONS 06/24/2021 Clinical Impression Ms. Ozier presents with a moderate dysphagia, impacted by both mechanical and cognitive components.  She was intermittently lethargic throughout the assessment.  Initially, she held her neck in deep flexion, which appeared to offer some protection when swallowing thin liquids - there was no penetration nor aspiration.  Later in the study, when sleepier and when head was in extension and neutral, thin liquids were immediately aspirated with an ensuing cough response. Purees sat in pharynx and pt was unable to move them due to poor wakefulness at that point in the study. Study was terminated and oral suctioning was provided.  Recommend continuing intensive oral care; offer ice chips after oral care.  Maintain NPO status for now.  SLP will follow for therapeutic PO trials -  as mentation improves we will have a clearer understanding of potential impact of ACDF/prevertebral swelling on swallowing.  Marland Kitchen SLP Visit Diagnosis Dysphagia, pharyngeal phase (R13.13) Attention and concentration deficit following -- Frontal lobe and executive function deficit following -- Impact on safety and function Moderate aspiration risk   CHL IP TREATMENT RECOMMENDATION 06/24/2021 Treatment Recommendations Therapy as outlined in  treatment plan below   Prognosis 06/24/2021 Prognosis for Safe Diet Advancement Fair Barriers to Reach Goals Cognitive deficits Barriers/Prognosis Comment -- CHL IP DIET RECOMMENDATION 06/24/2021 SLP Diet Recommendations NPO Liquid Administration via -- Medication Administration -- Compensations -- Postural Changes --   CHL IP OTHER RECOMMENDATIONS 06/24/2021 Recommended Consults --  Oral Care Recommendations Oral care QID Other Recommendations --   CHL IP FOLLOW UP RECOMMENDATIONS 06/24/2021 Follow up Recommendations [facility] D[facility] 06/24/2021 Speech Therapy Frequency (ACUTE ONLY) min 2x/week Treatment Duration 2 weeks      CHL IP ORAL PHASE 06/24/2021 Oral Phase WFL Oral - Pudding Teaspoon -- Oral - Pudding Cup -- Oral - Honey Teaspoon -- Oral - Honey Cup -- Oral - Nectar Teaspoon -- Oral - Nectar Cup -- Oral - Nectar Straw -- Oral - Thin Teaspoon -- Oral - Thin Cup -- Oral - Thin Straw -- Oral - Puree -- Oral - Mech Soft -- Oral - Regular -- Oral - Multi-Consistency -- Oral - Pill -- Oral Phase - Comment --  CHL IP PHARYNGEAL PHASE 06/24/2021 Pharyngeal Phase Impaired Pharyngeal- Pudding Teaspoon -- Pharyngeal -- Pharyngeal- Pudding Cup -- Pharyngeal -- Pharyngeal- Honey Teaspoon -- Pharyngeal -- Pharyngeal- Honey Cup -- Pharyngeal -- Pharyngeal- Nectar Teaspoon -- Pharyngeal -- Pharyngeal- Nectar Cup -- Pharyngeal -- Pharyngeal- Nectar Straw -- Pharyngeal -- Pharyngeal- Thin Teaspoon -- Pharyngeal -- Pharyngeal- Thin Cup -- Pharyngeal -- Pharyngeal- Thin Straw -- Pharyngeal -- Pharyngeal- Puree -- Pharyngeal -- Pharyngeal- Mechanical Soft -- Pharyngeal -- Pharyngeal- Regular -- Pharyngeal -- Pharyngeal- Multi-consistency -- Pharyngeal -- Pharyngeal- Pill -- Pharyngeal -- Pharyngeal Comment --  No flowsheet data found. Blenda MountsCouture, Amanda Laurice 06/24/2021, 3:12 PM              DG C-Arm 1-60 Min  Result Date: 06/16/2021 CLINICAL DATA:  Neck pain EXAM: DG CERVICAL SPINE - 1 VIEW; DG  C-ARM 1-60 MIN COMPARISON:  MRI 06/16/2021 FINDINGS: Single view intraoperative radiograph of the cervical spine demonstrates cervical spine from the occiput foot through C5-6. C5-6 anterior cervical discectomy and fusion with instrumentation has been performed. Normal alignment. Ray-Tec is seen within the soft tissues anterior to the fusion. Endotracheal tube is in place. Fluoroscopy time: 3 seconds Dose: 0.25 mGy Images: 1 IMPRESSION: ACDF C5-6.  Ray-Tec in place within the anterior soft tissues. Electronically Signed   By: Helyn NumbersAshesh  Parikh MD   On: 06/16/2021 15:04   ECHOCARDIOGRAM COMPLETE  Result Date: 06/18/2021    ECHOCARDIOGRAM REPORT   Patient Name:   Heron NayLINDA T [name] Date of Exam: 06/18/2021 Medical Rec #:  [id number]     Height:       66.0 in Accession #:    [id number]    Weight:       188.0 lb Date of Birth:  07/24/1946      BSA:          1.948 m Patient Age:    75 years      BP:           133/70 mmHg Patient Gender: F             HR:           98 bpm. Exam Location:  Inpatient Procedure: 2D Echo, Cardiac Doppler and Color Doppler Indications:    Epidural abscess [[id number]]  History:        Patient has no prior history of Echocardiogram examinations.  Sonographer:    Renella CunasJulia Swaim RDCS Referring Phys: [id number] [facility] LcABINA MANANDHAR IMPRESSIONS  1. Left ventricular ejection fraction, by estimation, is 65 to 70%. The left ventricle has normal function. The left ventricle has no regional wall motion abnormalities. Left ventricular diastolic parameters are consistent with Grade I diastolic dysfunction (impaired relaxation).  2. Right ventricular systolic function is normal. The  right ventricular size is normal. There is normal pulmonary artery systolic pressure.  3. The mitral valve is normal in structure. Mild mitral valve regurgitation. No evidence of mitral stenosis.  4. The aortic valve is normal in structure. Aortic valve regurgitation is mild. No aortic stenosis is present.  5. The inferior vena cava is normal in size  with greater than 50% respiratory variability, suggesting right atrial pressure of 3 mmHg. FINDINGS  Left Ventricle: Left ventricular ejection fraction, by estimation, is 65 to 70%. The left ventricle has normal function. The left ventricle has no regional wall motion abnormalities. The left ventricular internal cavity size was normal in size. There is  no left ventricular hypertrophy. Left ventricular diastolic parameters are consistent with Grade I diastolic dysfunction (impaired relaxation). Right Ventricle: The right ventricular size is normal. No increase in right ventricular wall thickness. Right ventricular systolic function is normal. There is normal pulmonary artery systolic pressure. The tricuspid regurgitant velocity is 2.44 m/s, and  with an assumed right atrial pressure of 3 mmHg, the estimated right ventricular systolic pressure is 26.8 mmHg. Left Atrium: Left atrial size was normal in size. Right Atrium: Right atrial size was normal in size. Pericardium: There is no evidence of pericardial effusion. Mitral Valve: The mitral valve is normal in structure. Mild mitral valve regurgitation, with posteriorly-directed jet. No evidence of mitral valve stenosis. Tricuspid Valve: The tricuspid valve is normal in structure. Tricuspid valve regurgitation is mild . No evidence of tricuspid stenosis. Aortic Valve: The aortic valve is normal in structure. Aortic valve regurgitation is mild. No aortic stenosis is present. Pulmonic Valve: The pulmonic valve was normal in structure. Pulmonic valve regurgitation is not visualized. No evidence of pulmonic stenosis. Aorta: The aortic root is normal in size and structure. Venous: The inferior vena cava is normal in size with greater than 50% respiratory variability, suggesting right atrial pressure of 3 mmHg. IAS/Shunts: No atrial level shunt detected by color flow Doppler.  LEFT VENTRICLE PLAX 2D LVIDd:         4.60 cm     Diastology LVIDs:         3.50 cm     LV e'  medial:    6.96 cm/s LV PW:         1.10 cm     LV E/e' medial:  10.2 LV IVS:        1.10 cm     LV e' lateral:   7.62 cm/s LVOT diam:     2.20 cm     LV E/e' lateral: 9.4 LV SV:         92 LV SV Index:   47 LVOT Area:     3.80 cm  LV Volumes (MOD) LV vol d, MOD A2C: 79.4 ml LV vol d, MOD A4C: 83.8 ml LV vol s, MOD A2C: 32.1 ml LV vol s, MOD A4C: 33.3 ml LV SV MOD A2C:     47.3 ml LV SV MOD A4C:     83.8 ml LV SV MOD BP:      51.4 ml RIGHT VENTRICLE RV S prime:     17.40 cm/s TAPSE (M-mode): 2.2 cm LEFT ATRIUM           Index       RIGHT ATRIUM           Index LA diam:      3.30 cm 1.69 cm/m  RA Area:     13.30 cm LA Vol (A2C): 37.9 ml  19.46 ml/m RA Volume:   34.40 ml  17.66 ml/m LA Vol (A4C): 24.6 ml 12.63 ml/m  AORTIC VALVE LVOT Vmax:   131.00 cm/s LVOT Vmean:  81.400 cm/s LVOT VTI:    0.243 m  AORTA Ao Root diam: 3.30 cm MITRAL VALVE                TRICUSPID VALVE MV Area (PHT): 2.67 cm     TR Peak grad:   23.8 mmHg MV Decel Time: 284 msec     TR Vmax:        244.00 cm/s MR Peak grad: 82.4 mmHg MR Vmax:      454.00 cm/s   SHUNTS MV E velocity: 71.30 cm/s   Systemic VTI:  0.24 m MV A velocity: 102.00 cm/s  Systemic Diam: 2.20 cm MV E/A ratio:  0.70 Donato Schultz MD Electronically signed by Donato Schultz MD Signature Date/Time: 06/18/2021/1:08:50 PM    Final    VAS [place] LOWER EXTREMITY VENOUS (DVT)  Result Date: 07/07/2021  Lower Venous DVT Study Patient Name:  Kristy [name]  Date of Exam:   07/06/2021 Medical Rec #: [id number]      Accession #:    [id number] Date of Birth: 1945-12-05       Patient Gender: F Patient Age:   13 years Exam Location:  [facility] Procedure:      VAS [place] LOWER EXTREMITY VENOUS (DVT) Referring Phys: DAWOOD ELGERGAWY --------------------------------------------------------------------------------  Indications: Swelling.  Limitations: Altered mental status, inability to bend legs. Comparison Study: No prior study on file Performing Technologist: Sherren Kerns RVS  Examination  Guidelines: A complete evaluation includes B-mode imaging, spectral Doppler, color Doppler, and power Doppler as needed of all accessible portions of each vessel. Bilateral testing is considered an integral part of a complete examination. Limited examinations for reoccurring indications may be performed as noted. The reflux portion of the exam is performed with the patient in reverse Trendelenburg.  +---------+---------------+---------+-----------+----------+-------------------+ RIGHT    CompressibilityPhasicitySpontaneityPropertiesThrombus Aging      +---------+---------------+---------+-----------+----------+-------------------+ CFV      Full                                                             +---------+---------------+---------+-----------+----------+-------------------+ SFJ      Full                                                             +---------+---------------+---------+-----------+----------+-------------------+ FV Prox  Full                                                             +---------+---------------+---------+-----------+----------+-------------------+ FV Mid   Full                                                             +---------+---------------+---------+-----------+----------+-------------------+  FV DistalFull                                                             +---------+---------------+---------+-----------+----------+-------------------+ PFV                                                   Not well visualized +---------+---------------+---------+-----------+----------+-------------------+ POP                     Yes      Yes                  patent by color and                                                       Doppler             +---------+---------------+---------+-----------+----------+-------------------+ PTV      Full                                                              +---------+---------------+---------+-----------+----------+-------------------+ PERO     Full                                                             +---------+---------------+---------+-----------+----------+-------------------+   +---------+---------------+---------+-----------+----------+-------------------+ LEFT     CompressibilityPhasicitySpontaneityPropertiesThrombus Aging      +---------+---------------+---------+-----------+----------+-------------------+ CFV      Full           Yes      Yes                                      +---------+---------------+---------+-----------+----------+-------------------+ SFJ      Full                                                             +---------+---------------+---------+-----------+----------+-------------------+ FV Prox  Full                                                             +---------+---------------+---------+-----------+----------+-------------------+ FV Mid   Full                                                             +---------+---------------+---------+-----------+----------+-------------------+  FV DistalFull                                                             +---------+---------------+---------+-----------+----------+-------------------+ PFV                                                   Not well visualized +---------+---------------+---------+-----------+----------+-------------------+ POP                                                   patent by color and                                                       Doppler             +---------+---------------+---------+-----------+----------+-------------------+ PTV      Full                                                             +---------+---------------+---------+-----------+----------+-------------------+ PERO     Full                                                              +---------+---------------+---------+-----------+----------+-------------------+     Summary: RIGHT: - There is no evidence of deep vein thrombosis in the lower extremity. However, portions of this examination were limited- see technologist comments above.  - Ultrasound characteristics of enlarged lymph nodes are noted in the groin.  LEFT: - There is no evidence of deep vein thrombosis in the lower extremity. However, portions of this examination were limited- see technologist comments above.  *See table(s) above for measurements and observations. Electronically signed by Sherald Hess MD on 07/07/2021 at 2:26:33 PM.    Final    VAS [place] UPPER EXTREMITY VENOUS DUPLEX  Result Date: 06/19/2021 UPPER VENOUS STUDY  Patient Name:  Kristy [name]  Date of Exam:   06/19/2021 Medical Rec #: [id number]      Accession #:    [id number] Date of Birth: 04/28/1946       Patient Gender: F Patient Age:   74 years Exam Location:  [facility] Procedure:      VAS [place] UPPER EXTREMITY VENOUS DUPLEX Referring Phys: Odette Fraction --------------------------------------------------------------------------------  Indications: Pain, and Swelling Risk Factors: IV's in LUE (hand and forearm). Limitations: Poor ultrasound/tissue interface and IV bandage, and patient pain intolerance. Comparison Study: No previous exams Performing Technologist: Jody Hill RVT, RDMS  Examination Guidelines: A complete evaluation includes B-mode imaging,  spectral Doppler, color Doppler, and power Doppler as needed of all accessible portions of each vessel. Bilateral testing is considered an integral part of a complete examination. Limited examinations for reoccurring indications may be performed as noted.  Right Findings: +----------+------------+---------+-----------+----------+-------+ RIGHT     CompressiblePhasicitySpontaneousPropertiesSummary +----------+------------+---------+-----------+----------+-------+ Subclavian    Full       Yes        Yes                      +----------+------------+---------+-----------+----------+-------+  Left Findings: +----------+------------+---------+-----------+----------+-------+ LEFT      CompressiblePhasicitySpontaneousPropertiesSummary +----------+------------+---------+-----------+----------+-------+ IJV           Full       Yes       Yes                      +----------+------------+---------+-----------+----------+-------+ Subclavian    Full       Yes       Yes                      +----------+------------+---------+-----------+----------+-------+ Axillary      Full       Yes       Yes                      +----------+------------+---------+-----------+----------+-------+ Brachial      Full       Yes       Yes                      +----------+------------+---------+-----------+----------+-------+ Radial        Full                                          +----------+------------+---------+-----------+----------+-------+ Ulnar         Full                                          +----------+------------+---------+-----------+----------+-------+ Cephalic      Full                                          +----------+------------+---------+-----------+----------+-------+ Basilic       Full       Yes       Yes                      +----------+------------+---------+-----------+----------+-------+ Limited visualization of ulnar and radial veins due to IV placement, edema, and patient pain intolerance.  Summary:  Right: No evidence of thrombosis in the subclavian.  Left: No evidence of deep vein thrombosis in the upper extremity. No evidence of superficial vein thrombosis in the upper extremity.  *See table(s) above for measurements and observations.  Diagnosing physician: Coral Else MD Electronically signed by Coral Else MD on 06/19/2021 at 6:54:25 PM.    Final     Microbiology: Recent Results (from the past 240 hour(s))  Urine Culture      Status: Abnormal   Collection Time: 07/03/21  8:46 AM   Specimen: Urine, Catheterized  Result Value Ref Range Status   Specimen Description URINE, CATHETERIZED  Final  Special Requests   Final    NONE Performed at [facility], [address] [place], [place] [address]    Culture MULTIPLE SPECIES PRESENT, SUGGEST RECOLLECTION (A)  Final   Report Status 07/05/2021 FINAL  Final  Culture, blood (Routine X 2) w Reflex to ID Panel     Status: None   Collection Time: 07/04/21  8:03 AM   Specimen: BLOOD RIGHT HAND  Result Value Ref Range Status   Specimen Description BLOOD RIGHT HAND  Final   Special Requests   Final    BOTTLES DRAWN AEROBIC ONLY Blood Culture results may not be optimal due to an inadequate volume of blood received in culture bottles   Culture   Final    NO GROWTH 5 DAYS Performed at [facility], [address] [place], [place] [address]    Report Status 07/09/2021 FINAL  Final  Culture, blood (Routine X 2) w Reflex to ID Panel     Status: None   Collection Time: 07/04/21  8:04 AM   Specimen: BLOOD RIGHT HAND  Result Value Ref Range Status   Specimen Description BLOOD RIGHT HAND  Final   Special Requests   Final    BOTTLES DRAWN AEROBIC ONLY Blood Culture results may not be optimal due to an inadequate volume of blood received in culture bottles   Culture   Final    NO GROWTH 5 DAYS Performed at [facility], [address] [place], [place] [address]    Report Status 07/09/2021 FINAL  Final  Resp Panel by RT-PCR (Flu A&B, Covid) Nasopharyngeal Swab     Status: None   Collection Time: 07/10/21 12:30 PM   Specimen: Nasopharyngeal Swab; Nasopharyngeal(NP) swabs in vial transport medium  Result Value Ref Range Status   SARS Coronavirus 2 by RT PCR NEGATIVE NEGATIVE Final    Comment: (NOTE) SARS-CoV-2 target nucleic acids are NOT DETECTED.  The SARS-CoV-2 RNA is generally detectable in upper respiratory specimens during the acute phase of  infection. The lowest concentration of SARS-CoV-2 viral copies this assay can detect is 138 copies/mL. A negative result does not preclude SARS-Cov-2 infection and should not be used as the sole basis for treatment or other patient management decisions. A negative result may occur with  improper specimen collection/handling, submission of specimen other than nasopharyngeal swab, presence of viral mutation(s) within the areas targeted by this assay, and inadequate number of viral copies(<138 copies/mL). A negative result must be combined with clinical observations, patient history, and epidemiological information. The expected result is Negative.  Fact Sheet for Patients:  BloggerCourse.com  Fact Sheet for Healthcare Providers:  SeriousBroker.it  This test is no t yet approved or cleared by the [place] FDA and  has been authorized for detection and/or diagnosis of SARS-CoV-2 by FDA under an Emergency Use Authorization (EUA). This EUA will remain  in effect (meaning this test can be used) for the duration of the COVID-19 declaration under Section 564(b)(1) of the Act, 21 U.S.C.section 360bbb-3(b)(1), unless the authorization is terminated  or revoked sooner.       Influenza A by PCR NEGATIVE NEGATIVE Final   Influenza B by PCR NEGATIVE NEGATIVE Final    Comment: (NOTE) The Xpert Xpress SARS-CoV-2/FLU/RSV plus assay is intended as an aid in the diagnosis of influenza from Nasopharyngeal swab specimens and should not be used as a sole basis for treatment. Nasal washings and aspirates are unacceptable for Xpert Xpress SARS-CoV-2/FLU/RSV testing.  Fact Sheet  for Patients: BloggerCourse.com  Fact Sheet for Healthcare Providers: SeriousBroker.it  This test is not yet approved or cleared by the [place] FDA and has been authorized for detection and/or diagnosis of SARS-CoV-2  by FDA under an Emergency Use Authorization (EUA). This EUA will remain in effect (meaning this test can be used) for the duration of the COVID-19 declaration under Section 564(b)(1) of the Act, 21 U.S.C. section 360bbb-3(b)(1), unless the authorization is terminated or revoked.  Performed at [facility], [address], [place], [place] [address]      Labs: Basic Metabolic Panel: Recent Labs  Lab 07/05/21 0352 07/06/21 0217 07/07/21 0258 07/08/21 0311 07/09/21 0206 07/10/21 0115  NA 134* 136 137 135 135 134*  K 3.8 4.4 4.2 4.4 4.2 4.2  CL 104 107 106 105 104 104  CO2 22 20* 20* 20* 21* 21*  GLUCOSE 142* 101* 111* 107* 118* 114*  BUN 27* 28* 27* 27* 28* 24*  CREATININE 1.17* 1.14* 1.02* 1.03* 1.03* 1.03*  CALCIUM 8.4* 8.3* 8.7* 8.8* 8.7* 8.6*  MG  --  1.6*  --  1.8  --   --   PHOS 3.7 3.3 3.8 3.9  --   --    Liver Function Tests: Recent Labs  Lab 07/05/21 0352 07/06/21 0217 07/07/21 0258 07/08/21 0311 07/09/21 0206  AST ALT ALKPHOS 146* 174* 173* 165* 160*  BILITOT 1.1 1.3* 1.0 1.0 1.1  PROT 5.9* 5.8* 6.0* 6.4* 5.8*  ALBUMIN 1.8* 1.5* 1.4* 1.5* 1.3*   No results for input(s): LIPASE, AMYLASE in the last 168 hours. No results for input(s): AMMONIA in the last 168 hours. CBC: Recent Labs  Lab 07/07/21 0258 07/08/21 0311 07/09/21 0206 07/10/21 0115 07/11/21 0722  WBC 8.4 8.3 8.6 8.9 11.2*  NEUTROABS 6.2 6.0 5.9 6.0 PENDING  HGB 8.7* 9.0* 8.2* 8.5* 8.8*  HCT 27.7* 27.9* 26.3* 27.0* 27.5*  MCV 98.6 97.9 98.9 98.5 97.5  PLT 211 228 226 247 246   Cardiac Enzymes: No results for input(s): CKTOTAL, CKMB, CKMBINDEX, TROPONINI in the last 168 hours. BNP: BNP (last 3 results) Recent Labs    07/06/21 0217  BNP 452.3*    ProBNP (last 3 results) No results for input(s): PROBNP in the last 8760 hours.  CBG: Recent Labs  Lab 07/10/21 1558 07/10/21 1934 07/10/21 2330 07/11/21 0345 07/11/21 0811  GLUCAP 120* 125*  119* 128* 107*       Signed:  Junious Silk ANP [facility] 07/11/2021, 9:52 AM

## 2021-07-11 NOTE — NC FL2 (Signed)
[place] MEDICAID FL2 LEVEL OF CARE SCREENING TOOL     IDENTIFICATION  Patient Name: Kristy [name] Birthdate: 19-May-1946 Sex: female Admission Date (Current Location): 06/10/2021  [facility] and [place] Number:  Producer, television/film/video and Address:  [facility] [place], [address], [place], [place] [address]      Provider Number: [id number]  Attending Physician Name and Address:  Maretta Bees, MD  Relative Name and Phone Number:  [name], daughter - [phone]    Current Level of Care: Hospital Recommended Level of Care: Skilled Nursing Facility Prior Approval Number:    Date Approved/Denied:   PASRR Number: [id number]  Discharge Plan: SNF    Current Diagnoses: Patient Active Problem List   Diagnosis Date Noted   Abnormal urinalysis 07/11/2021   Acute urinary retention 07/11/2021   MSSA (methicillin susceptible Staphylococcus aureus) infection/UTI-POA 07/11/2021   Cervical discitis 07/11/2021   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) 07/11/2021   Dysphagia, neurologic  07/11/2021   CKD stage G3b/A1, 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) 07/11/2021   HTN (hypertension) 07/11/2021   Grade I diastolic dysfunction 07/11/2021   HLD (hyperlipidemia) 07/11/2021   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   Malnutrition of moderate degree 06/18/2021   Altered mental status 06/14/2021   Acute delirium 06/14/2021   Sepsis (HCC) 06/14/2021   Fall 06/11/2021   Alcoholic hepatitis 06/11/2021    Orientation RESPIRATION BLADDER Height & Weight     Self, Place  Normal Indwelling catheter Weight: 89.9 kg Height:  5\' 6"  (167.6 cm)  BEHAVIORAL SYMPTOMS/MOOD NEUROLOGICAL BOWEL NUTRITION STATUS      Incontinent Feeding tube (PEG feeding tube - Osmolite 1.2 Cal at 65 cc/hr)  AMBULATORY STATUS COMMUNICATION OF NEEDS Skin   Extensive Assist Verbally Normal, Other (Comment), Surgical wounds (PEG  tube for tube feedings, skin glue intact to right neck surgical incision, multiple skin tears on buttocks)                       Personal Care Assistance Level of Assistance  Bathing, Feeding, Dressing Bathing Assistance: Maximum assistance Feeding assistance: Maximum assistance Dressing Assistance: Maximum assistance     Functional Limitations Info  Sight, Hearing, Speech Sight Info: Adequate Hearing Info: Adequate Speech Info: Adequate    SPECIAL CARE FACTORS FREQUENCY  PT (By licensed PT), OT (By licensed OT)     PT Frequency: 2-3 times per week OT Frequency: 2-3 time per week            Contractures Contractures Info: Not present    Additional Factors Info  Code Status, Allergies Code Status Info: Full code Allergies Info: NKDA           Current Medications (07/11/2021):  This is the current hospital active medication list Current Facility-Administered Medications  Medication Dose Route Frequency Provider Last Rate Last Admin   acetaminophen (TYLENOL) 160 MG/5ML solution 650 mg  650 mg Per Tube Q6H 09/10/2021, NP   650 mg at 07/11/21 0836   allopurinol (ZYLOPRIM) tablet 300 mg  300 mg Per Tube Daily 09/10/21, MD   300 mg at 07/11/21 0836   antiseptic oral rinse (BIOTENE) solution 15 mL  15 mL Mouth Rinse PRN 09/10/21, MD       ceFAZolin (ANCEF) IVPB 2g/100 mL premix  2 g Intravenous Q8H Robertson, Crystal S, RPH 200 mL/hr at 07/11/21 0351 2 g at  07/11/21 0351   Chlorhexidine Gluconate Cloth 2 % PADS 6 each  6 each Topical Daily Jadene Pierini, MD   6 each at 07/10/21 0915   diclofenac Sodium (VOLTAREN) 1 % topical gel 4 g  4 g Topical QID Russella Dar, NP   4 g at 07/11/21 5188   feeding supplement (OSMOLITE 1.2 CAL) liquid 1,000 mL  1,000 mL Per Tube Continuous Russella Dar, NP 65 mL/hr at 07/11/21 0845 1,000 mL at 07/11/21 0845   feeding supplement (PROSource TF) liquid 45 mL  45 mL Per Tube BID Maretta Bees, MD    45 mL at 07/11/21 0836   folic acid (FOLVITE) tablet 1 mg  1 mg Per Tube Daily Azucena Fallen, MD   1 mg at 07/11/21 0836   free water 125 mL  125 mL Per Tube Q4H Russella Dar, NP   125 mL at 07/11/21 0838   heparin injection 5,000 Units  5,000 Units Subcutaneous Q8H Elgergawy, Leana Roe, MD   5,000 Units at 07/11/21 0618   melatonin tablet 3 mg  3 mg Per Tube QHS PRN Azucena Fallen, MD   3 mg at 07/09/21 2218   midodrine (PROAMATINE) tablet 5 mg  5 mg Per Tube TID WC Maretta Bees, MD   5 mg at 07/11/21 4166   multivitamin liquid 15 mL  15 mL Per Tube Daily Leatha Gilding, MD   15 mL at 07/11/21 0836   naloxone (NARCAN) injection 0.4 mg  0.4 mg Intravenous PRN Jadene Pierini, MD       niacin tablet 500 mg  500 mg Per Tube QHS Elgergawy, Leana Roe, MD   500 mg at 07/10/21 2230   ondansetron (ZOFRAN) injection 4 mg  4 mg Intravenous Q6H PRN Jadene Pierini, MD       oxyCODONE (Oxy IR/ROXICODONE) immediate release tablet 5 mg  5 mg Oral Q6H PRN Elgergawy, Leana Roe, MD   5 mg at 07/08/21 2118   polyethylene glycol (MIRALAX / GLYCOLAX) packet 17 g  17 g Per Tube Daily PRN Azucena Fallen, MD       rifampin (RIFADIN) capsule 300 mg  300 mg Oral Q12H Pierce, Dwayne A, RPH   300 mg at 07/11/21 0630   thiamine tablet 100 mg  100 mg Per Tube Daily Joaquim Nam, RPH   100 mg at 07/11/21 1601     Discharge Medications: Please see discharge summary for a list of discharge medications.  Relevant Imaging Results:  Relevant Lab Results:   Additional Information SS# [id number], COVID vaccinations - Pfizer 07/02/2021, 02/28/2020, 02/06/2020  [name], RN

## 2021-07-11 NOTE — TOC Progression Note (Addendum)
Transition of Care [facility]) - Progression Note    Patient Details  Name: Kristy [name] MRN: [id number] Date of Birth: 07/20/1946  Transition of Care [facility]) CM/SW Contact  [name], RN Phone Number: 07/11/2021, 8:29 AM  Clinical Narrative:    Case management called and spoke with Alphonzo Lemmings, CM at [facility] in [place], [place] and the facility will not have an available admission bed for 4-5 days.  I called and spoke with the patient's daughter, [name], on the phone and she would like the patient to transfer to [facility].  I called and spoke with Georgeann Oppenheim, CM at [facility] and the facility is able to offer an admission bed today.  CM spoke with Junious Silk, NP so discharge summary and orders can be completed and sent to the facility.  Georgeann Oppenheim, CM at [facility] will call back so that I can coordinate [facility] transfer to the facility.  CM and MSW will continue to follow the patient for transfer to [facility].  Bedside nursing can call report to [facility] Skilled nursing at [phone] - for Room 116.  [facility] was called for transportation to the facility at 07/11/2021 1150.     Expected Discharge Plan: Skilled Nursing Facility Barriers to Discharge: Continued Medical Work up  Expected Discharge Plan and Services Expected Discharge Plan: Skilled Nursing Facility In-house Referral: Clinical Social Work Discharge Planning Services: CM Consult Post Acute Care Choice: Skilled Nursing Facility Living arrangements for the past 2 months: Single Family Home                                       Social Determinants of Health (SDOH) Interventions    Readmission Risk Interventions No flowsheet data found.

## 2021-07-11 NOTE — Progress Notes (Signed)
Attempted to call report 2x  @ [phone], no answer. At this time pt transport is here to escort pt to [facility]. Will make a attempt to call report 1 more time

## 2021-07-12 ENCOUNTER — Non-Acute Institutional Stay (SKILLED_NURSING_FACILITY): Payer: Self-pay | Admitting: Internal Medicine

## 2021-07-12 ENCOUNTER — Encounter: Payer: Self-pay | Admitting: Internal Medicine

## 2021-07-12 DIAGNOSIS — A4901 Methicillin susceptible Staphylococcus aureus infection, unspecified site: Secondary | ICD-10-CM

## 2021-07-12 DIAGNOSIS — D500 Iron deficiency anemia secondary to blood loss (chronic): Secondary | ICD-10-CM

## 2021-07-12 DIAGNOSIS — R1319 Other dysphagia: Secondary | ICD-10-CM

## 2021-07-12 DIAGNOSIS — K7010 Alcoholic hepatitis without ascites: Secondary | ICD-10-CM

## 2021-07-12 DIAGNOSIS — N1832 Chronic kidney disease, stage 3b: Secondary | ICD-10-CM

## 2021-07-12 DIAGNOSIS — G9341 Metabolic encephalopathy: Secondary | ICD-10-CM

## 2021-07-12 DIAGNOSIS — R338 Other retention of urine: Secondary | ICD-10-CM

## 2021-07-12 NOTE — Patient Instructions (Signed)
See assessment and plan under each diagnosis in the problem list and acutely for this visit 

## 2021-07-12 NOTE — Assessment & Plan Note (Addendum)
Creatinine peaked at 2.74 with a GFR of 18 indicating stage IV AKI. At discharge creatinine 1.03 with a GFR of 57 indicating CKD stage IIIa. Decrease Allopurinol to 150 mg qd.

## 2021-07-12 NOTE — Progress Notes (Signed)
NURSING HOME LOCATION:  [facility] ROOM NUMBER:  [id number]  CODE STATUS:    PCP:  Darrow Bussing MD  This is a comprehensive admission note to this SNFperformed on this date less than 30 days from date of admission. Included are preadmission medical/surgical history; reconciled medication list; family history; social history and comprehensive review of systems.  Corrections and additions to the records were documented. Comprehensive physical exam was also performed. Additionally a clinical summary was entered for each active diagnosis pertinent to this admission in the Problem List to enhance continuity of care.  HPI: Patient was hospitalized 8/1 - 07/11/2021 ,admitted with mental status changes of confusion with associated falls and generalized weakness.  Evaluation revealed an epidural abscess involving the cervical spine with associated discitis. Anterior cervical decompression/discectomy and fusion at C5-6 was performed 8/7.  Cultures were positive for MRSA. ID recommended continuing IV cefazolin and Ancef until 10/2. She had incredibly elevated or abnormal labs.  This included a sodium which was 128 on admission but subsequently rose as high as 167.  At discharge it was 134.  Potassium was 2.6 at admission.  AST was recorded as high as a 576 and ALT 305.  AKI was present with a creatinine of 2.7 and GFR of 18.  At discharge creatinine was 1.3 and GFR 57.  Admission hemoglobin was 15.2 and hematocrit 43.  The hemoglobin dropped as low as 6.7/21.4.  At discharge values were 8.8/ 27.5.  She exhibited thrombocytopenia with platelet count of 100,000.  This did correct and was 246,000 prior to discharge. Course was complicated by encephalopathy which did improve, urinary retention necessitating Foley, and dysphagia. Interventional radiology placed a gastrostomy tube 8/23. She was discharged to this SNF for rehab.  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  Date given as Tuesday, May 14, 1992.  She could not identify the POT[facility].  When I asked if she lived alone she stated she lived "with her".  When I asked that person's identity she said "neighbor".  Shortly thereafter she told me she was living with her brother. She stated that the pulse oximetry device made her arm itch. She told me she had been in the hospital because she hurt her knee and could not walk.  She stated that she was in the hospital so they could examine the knee.  Physical exam:  Pertinent or positive findings: She appears chronically ill.  She kept her eyes closed during the interview and her head turned to the right.  She did briefly open her eyes and there was no jaundice.  There is a large keratotic lesion at the left temple.  The maxilla is edentulous.  The mandibular teeth are markedly coated.  There is marked hirsutism of the chin.  She cried out in pain as I auscultated the chest.  There were no lesions in this area.  She also cried out in pain when the OT staff member lifted her hand.  Breath sounds are markedly decreased.  Heart sounds are distant.  Abdomen is protuberant.  PEG tube present.  PICC line is present in the left upper extremity.  Pedal pulses cannot be palpated.  There are phenomenal keratotic changes to the dorsum of the feet, mainly on the left.  There is stasis hyperpigmentation of the shins greater on the left.  Exfoliative dermatitis is present over the left neck and upper chest.  The  left hand is edematous more so than the right.  She does not exhibit any spontaneous activity of the upper or lower extremities.  General appearance: no acute distress, increased work of breathing is present.   Lymphatic: No lymphadenopathy about the head, neck, axilla. Ears:  External ear exam shows no significant lesions or deformities.   Nose:  External nasal examination shows no deformity or  inflammation. Nasal mucosa are pink and moist without lesions, exudates Neck:  No thyromegaly, masses, tenderness noted.    Heart:  No gallop, murmur, click, rub.  Lungs: without wheezes, rhonchi, rales, rubs. Abdomen: Bowel sounds are normal.  Abdomen is soft and nontender with no organomegaly, hernias, masses. GU: Deferred  Extremities:  No cyanosis, clubbing Neurologic exam: Balance, Rhomberg, finger to nose testing could not be completed due to clinical state Skin: Warm & dry   See clinical summary under each active problem in the Problem List with associated updated therapeutic plan

## 2021-07-12 NOTE — Assessment & Plan Note (Addendum)
AST peaked at 576 and ALT at 305.monitor LFTs

## 2021-07-14 NOTE — Assessment & Plan Note (Signed)
No bleeding dyscrasias reported by Staff Monitor CBC  

## 2021-07-14 NOTE — Assessment & Plan Note (Signed)
Voiding trial when more stable

## 2021-07-14 NOTE — Assessment & Plan Note (Signed)
Monitor electrolytes.

## 2021-07-14 NOTE — Assessment & Plan Note (Signed)
Speech Therapy consult @ SNF

## 2021-07-14 NOTE — Assessment & Plan Note (Signed)
PICC line inserted @ SNF for parenteral antibiotics

## 2021-07-16 ENCOUNTER — Non-Acute Institutional Stay (SKILLED_NURSING_FACILITY): Payer: Self-pay | Admitting: Adult Health

## 2021-07-16 ENCOUNTER — Encounter: Payer: Self-pay | Admitting: Adult Health

## 2021-07-16 DIAGNOSIS — A4901 Methicillin susceptible Staphylococcus aureus infection, unspecified site: Secondary | ICD-10-CM

## 2021-07-16 DIAGNOSIS — L299 Pruritus, unspecified: Secondary | ICD-10-CM

## 2021-07-16 NOTE — Progress Notes (Signed)
Location:  [facility] Room Number: 116 A Place of Service:  SNF (31) Provider:  Kenard Gower, DNP, FNP-BC  Patient Care Team: Darrow Bussing, MD as PCP - General (Family Medicine)  Extended Emergency Contact Information Primary Emergency Contact: Waldren,Kimberly Address: [address]          [place] [address] [place] Phone: [phone] Relation: Daughter Secondary Emergency Contact: Crespin,Sidney Mobile Phone: [phone] Relation: Granddaughter  Code Status:    Goals of care: Advanced Directive information No flowsheet data found.   Chief Complaint  Patient presents with   Acute Visit    Itching all over her body    HPI:  Pt is a 75 y.o. female seen today for an acute visit for itching all over her body. She has rashes on BUE and face. She has dry scabs on bilateral feet.  She was admitted to [facility] on 07/11/21 post hospital admission 06/10/21 to 07/11/21. She has a PMH of essential hypertension, polycythemia and alcohol use disorder. She had a fall at home and was brought to ED.She has been having frequent falls at home, generalized weakness and confusion. She was found to have low K+ 2.6, serum creatinine 1.88 (above baseline ~1.5), LFT elevation (AST 576, ALT 305). Ammonia normal, EtOH, salicylate, tylenol levels negative. CT head and cervical spine were negative. She had urinary retention and rsing WBC so she was started on Ceftriaxone.  Neurology, infectious disease and neurosurgery were consulted for worsening mental status and nuchal rigidity. She S/P I&D of large epidural abscess on 06/16/2021 with cultures positive for MSSA.  She completed 2 weeks of nafcillin and continued on rifampin.  She will continue Ancef with last dose on 08/11/2021.  Midodrine was initiated hypotension   No past medical history on file. Past Surgical History:  Procedure Laterality Date   ANTERIOR CERVICAL  DECOMP/DISCECTOMY FUSION N/A 06/16/2021   Procedure: ANTERIOR CERVICAL DISCECTOMY FUSION Cervical five -Cervical six, Evacuation of Epidural Abscess;  Surgeon: Jadene Pierini, MD;  Location: [facility] OR;  Service: Neurosurgery;  Laterality: N/A;   IR GASTROSTOMY TUBE MOD SED  07/02/2021    No Known Allergies  Outpatient Encounter Medications as of 07/16/2021  Medication Sig   acetaminophen (TYLENOL) 160 MG/5ML solution Place 20.3 mLs (650 mg total) into feeding tube every 6 (six) hours.   allopurinol (ZYLOPRIM) 300 MG tablet Place 1 tablet (300 mg total) into feeding tube daily.   ceFAZolin (ANCEF) 2-4 GM/100ML-% IVPB Inject 100 mLs (2 g total) into the vein every 8 (eight) hours.   diclofenac Sodium (VOLTAREN) 1 % GEL Apply 4 g topically 4 (four) times daily. To neck   folic acid (FOLVITE) 1 MG tablet Place 1 tablet (1 mg total) into feeding tube daily.   melatonin 3 MG TABS tablet Place 1 tablet (3 mg total) into feeding tube at bedtime as needed.   midodrine (PROAMATINE) 5 MG tablet Place 1 tablet (5 mg total) into feeding tube 3 (three) times daily with meals.   Multiple Vitamin (MULTIVITAMIN) LIQD Place 15 mLs into feeding tube daily.   niacin 500 MG tablet Place 1 tablet (500 mg total) into feeding tube at bedtime.   Nutritional Supplements (FEEDING SUPPLEMENT, OSMOLITE 1.2 CAL,) LIQD Place 1,000 mLs into feeding tube continuous.   Nutritional Supplements (FEEDING SUPPLEMENT, PROSOURCE TF,) liquid Place 45 mLs into feeding tube 2 (two) times daily.   oxyCODONE (OXY IR/ROXICODONE) 5 MG immediate release tablet Take 1 tablet (5  mg total) by mouth every 6 (six) hours as needed for breakthrough pain or severe pain.   polyethylene glycol (MIRALAX / GLYCOLAX) 17 g packet Place 17 g into feeding tube daily as needed for mild constipation or severe constipation.   rifampin (RIFADIN) 300 MG capsule Take 1 capsule (300 mg total) by mouth every 12 (twelve) hours.   thiamine 100 MG tablet Place 1 tablet  (100 mg total) into feeding tube daily.   Water For Irrigation, Sterile (FREE WATER) SOLN Place 125 mLs into feeding tube every 4 (four) hours.   No facility-administered encounter medications on file as of 07/16/2021.    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    Immunization History  Administered Date(s) Administered   PFIZER Comirnaty(Gray Top)Covid-19 Tri-Sucrose Vaccine 07/02/2021   PFIZER(Purple Top)SARS-COV-2 Vaccination 02/06/2020, 02/28/2020   Pertinent  Health Maintenance Due  Topic Date Due   COLONOSCOPY (Pts 45-6yrs Insurance coverage will need to be confirmed)  Never done   DEXA SCAN  Never done   PNA vac Low Risk Adult (1 of 2 - PCV13) Never done   INFLUENZA VACCINE  Never done   No flowsheet data found.   Vitals:   07/16/21 1000  BP: 131/73  Pulse: 97  Resp: 18  Temp: (!) 97.5 F (36.4 C)  Weight: 185 lb 4.8 oz (84.1 kg)  Height:  (1.676 m)   Body mass index is 29.91 kg/m.  Physical Exam  GENERAL APPEARANCE: Well nourished. In no acute distress. Obese. SKIN:  see HPI MOUTH and THROAT: Lips are without lesions. Oral mucosa is moist and without lesions.  RESPIRATORY: Breathing is even & unlabored, BS CTAB CARDIAC: RRR, no murmur,no extra heart sounds, GI: Has peg tubeLeft upper arm single lumen PICC EXTREMITIES:   NEUROLOGICAL: There is no tremor. Speech is clear. Alert to self, disoriented to time and place.  Labs reviewed: Recent Labs    06/13/21 0306 06/16/21 0439 07/06/21 0217 07/07/21 0258 07/08/21 0311 07/09/21 0206 07/10/21 0115  NA 136   < > 136 137 135 135 134*  K 4.3   < > 4.4 4.2 4.4 4.2 4.2  CL 100   < > 107 106 105 104 104  CO2 22   < > 20* 20* 20* 21* 21*  GLUCOSE 84   < > 101* 111* 107* 118* 114*  BUN 40*   < > 28* 27* 27* 28* 24*  CREATININE 1.62*   < > 1.14* 1.02* 1.03* 1.03* 1.03*  CALCIUM 9.8   < > 8.3* 8.7* 8.8* 8.7* 8.6*  MG 2.1  --  1.6*  --  1.8  --   --   PHOS  --    < > 3.3 3.8  3.9  --   --    < > = values in this interval not displayed.   Recent Labs    07/07/21 0258 07/08/21 0311 07/09/21 0206  AST ALT ALKPHOS 173* 165* 160*  BILITOT 1.0 1.0 1.1  PROT 6.0* 6.4* 5.8*  ALBUMIN 1.4* 1.5* 1.3*   Recent Labs    07/09/21 0206 07/10/21 0115 07/11/21 0722  WBC 8.6 8.9 11.2*  NEUTROABS 5.9 6.0 7.2  HGB 8.2* 8.5* 8.8*  HCT 26.3* 27.0* 27.5*  MCV 98.9 98.5 97.5  PLT 226 247 246   Lab Results  Component Value Date   TSH 3.885 06/14/2021   No results found for: HGBA1C No results found for:  CHOL, HDL, LDLCALC, LDLDIRECT, TRIG, CHOLHDL  Significant Diagnostic Results in last 30 days:  CT ABDOMEN WO CONTRAST  Result Date: 06/27/2021 CLINICAL DATA:  assess anatomy for gastrostomy tube EXAM: CT ABDOMEN WITHOUT CONTRAST TECHNIQUE: Multidetector CT imaging of the abdomen was performed following the standard protocol without IV contrast. COMPARISON:  None. FINDINGS: Lower chest: Small bilateral pleural effusions with bibasilar atelectasis. Hepatobiliary: No focal liver abnormality is seen. No gallstones, gallbladder wall thickening, or biliary dilatation. Pancreas: Unremarkable. No pancreatic ductal dilatation or surrounding inflammatory changes. Spleen: Normal in size without focal abnormality. Adrenals/Urinary Tract: Adrenal glands are unremarkable. No hydronephrosis. There is a 3.0 cm low-density lesion in the left kidney which is compatible with a cyst. Stomach/Bowel: The stomach is within normal limits. There is a feeding tube in place with tip in the distal stomach. There is no other bowel interposed between the stomach and the abdominal wall, though the transverse colon is in close proximity. There is no evidence of bowel obstruction. The appendix is normal. Scattered colonic diverticuli. No bowel wall thickening or adjacent inflammatory changes. Vascular/Lymphatic: Aortoiliac atherosclerotic calcifications. No AAA. No lymphadenopathy. Other: No  abdominal wall hernia or abnormality. No abdominal ascites. Musculoskeletal: No acute osseous abnormality. No suspicious lytic or blastic lesions. Multilevel degenerative changes of the spine, with severe lower lumbar predominant facet arthropathy. Likely chronic right SI joint subchondral erosive change, with sclerotic margins. IMPRESSION: No acute abnormality in the abdomen. Feeding tube is in place with tip in the distal stomach. No other bowel is interposed between the stomach and the anterior abdominal wall, though the transverse colon is in close proximity. Small bilateral pleural effusions with adjacent bibasilar atelectasis. Electronically Signed   By: Caprice Renshaw M.D.   On: 06/27/2021 16:18   IR GASTROSTOMY TUBE MOD SED  Result Date: 07/02/2021 INDICATION: 75 year old female with dysphagia. She requires placement of a percutaneous gastrostomy tube. EXAM: Fluoroscopically guided placement of percutaneous pull-through gastrostomy tube Interventional Radiologist:  Sterling Big, MD MEDICATIONS: In patient currently receiving Ancef every 12 hours. No additional antibiotic prophylaxis administered. ANESTHESIA/SEDATION: Versed 1.5 mg IV; Fentanyl 75 mcg IV Moderate Sedation Time:  13 minutes The patient was continuously monitored during the procedure by the interventional radiology nurse under my direct supervision. CONTRAST:  10mL OMNIPAQUE IOHEXOL 240 MG/ML SOLN FLUOROSCOPY TIME:  Fluoroscopy Time: 2 minutes 12 seconds (13 mGy). COMPLICATIONS: None immediate. PROCEDURE: Informed written consent was obtained from the patient after a thorough discussion of the procedural risks, benefits and alternatives. All questions were addressed. Maximal Sterile Barrier Technique was utilized including caps, mask, sterile gowns, sterile gloves, sterile drape, hand hygiene and skin antiseptic. A timeout was performed prior to the initiation of the procedure. Maximal barrier sterile technique utilized including caps,  mask, sterile gowns, sterile gloves, large sterile drape, hand hygiene, and chlorhexadine skin prep. An angled catheter was advanced over a wire under fluoroscopic guidance through the nose, down the esophagus and into the body of the stomach. The stomach was then insufflated with several 100 ml of air. Fluoroscopy confirmed location of the gastric bubble, as well as inferior displacement of the barium stained colon. Under direct fluoroscopic guidance, a single T-tack was placed, and the anterior gastric wall drawn up against the anterior abdominal wall. Percutaneous access was then obtained into the mid gastric body with an 18 gauge sheath needle. Aspiration of air, and injection of contrast material under fluoroscopy confirmed needle placement. An Amplatz wire was advanced in the gastric body and the access needle  exchanged for a 9-French vascular sheath. A snare device was advanced through the vascular sheath and an Amplatz wire advanced through the angled catheter. The Amplatz wire was successfully snared and this was pulled up through the esophagus and out the mouth. A 20-French Burnell Blanks MIC-PEG tube was then connected to the snare and pulled through the mouth, down the esophagus, into the stomach and out to the anterior abdominal wall. Hand injection of contrast material confirmed intragastric location. The T-tack retention suture was then cut. The pull through peg tube was then secured with the external bumper and capped. The patient will be observed for several hours with the newly placed tube on low wall suction to evaluate for any post procedure complication. The patient tolerated the procedure well, there is no immediate complication. IMPRESSION: Successful placement of a 20 French pull through gastrostomy tube. Electronically Signed   By: Malachy Moan M.D.   On: 07/02/2021 16:01   CT SHOULDER LEFT WO CONTRAST  Result Date: 07/10/2021 CLINICAL DATA:  Acute pain EXAM: CT OF THE UPPER LEFT  EXTREMITY WITHOUT CONTRAST TECHNIQUE: Multidetector CT imaging of the upper left extremity was performed according to the standard protocol. COMPARISON:  None. FINDINGS: Bones/Joint/Cartilage There is no evidence of acute fracture or dislocation. The shoulder is held in internal rotation. There is moderate glenohumeral osteoarthritis with subchondral sclerosis and cystic change. There is preserved glenoid bone stock and neutral glenoid version. There is mild widening of the [facility] joint and mild degenerative change. Partially visualized ACDF hardware. Ligaments Suboptimally assessed by CT. Muscles and Tendons Grade 1 muscle atrophy of the cuff musculature. There is mineralization along the expected course of the coracoclavicular ligaments. Soft tissues No focal fluid collection. There are a few prominent left axillary lymph nodes. IMPRESSION: No acute fracture or dislocation. Moderate glenohumeral osteoarthritis. Widening of the [facility] joint with mild degenerative change and mineralization along the course of the coracoclavicular ligaments, which can be seen with chronic AC joint injury. Electronically Signed   By: Caprice Renshaw M.D.   On: 07/10/2021 08:30   CT ELBOW LEFT WO CONTRAST  Result Date: 07/10/2021 CLINICAL DATA:  Acute pain EXAM: CT OF THE UPPER LEFT EXTREMITY WITHOUT CONTRAST TECHNIQUE: Multidetector CT imaging of the upper left extremity was performed according to the standard protocol. COMPARISON:  None. FINDINGS: Bones/Joint/Cartilage There is no acute fracture or dislocation. Elbow alignment is normal. There is no significant degenerative change. There is no joint effusion. Ligaments Suboptimally assessed by CT. Muscles and Tendons No significant muscle atrophy. Soft tissues There is superficial soft tissue swelling. No focal fluid collection on noncontrast CT. IMPRESSION: Mild generalized superficial soft tissue swelling along the elbow. No acute osseous abnormality or joint effusion. Electronically  Signed   By: Caprice Renshaw M.D.   On: 07/10/2021 08:34   DG CHEST PORT 1 VIEW  Result Date: 06/30/2021 CLINICAL DATA:  Chest xray ordered because of fever. EXAM: PORTABLE CHEST - 1 VIEW COMPARISON:  06/14/2021 FINDINGS: Increase in ill-defined interstitial and airspace opacities in the lung bases, left greater than right. Heart size and mediastinal contours are within normal limits. Blunting of left lateral costophrenic angle suggesting small effusion. No pneumothorax. Surgical fixation hardware partially seen. Feeding tube extends at least as far as the stomach, tip not seen. IMPRESSION: Worsening bibasilar infiltrates or edema, left greater than right. Possible small left effusion. Electronically Signed   By: Corlis Leak M.D.   On: 06/30/2021 12:16   DG Abd Portable 1V  Result Date:  06/21/2021 CLINICAL DATA:  Nasogastric tube placement. EXAM: PORTABLE ABDOMEN - 1 VIEW COMPARISON:  None. FINDINGS: The bowel gas pattern is normal. Feeding tube tip is seen in expected position of distal stomach. No radio-opaque calculi or other significant radiographic abnormality are seen. IMPRESSION: Feeding tube tip seen in expected position of distal stomach. Electronically Signed   By: Lupita RaiderJames  Green Jr M.D.   On: 06/21/2021 14:39   DG Abd Portable 1V  Result Date: 06/21/2021 CLINICAL DATA:  Evaluate feeding tube placement EXAM: PORTABLE ABDOMEN - 1 VIEW COMPARISON:  06/17/2021 FINDINGS: The feeding tube tip is 8.2 cm below the GE junction in the expected location of the gastric fundus. Normal bowel gas pattern. IMPRESSION: Feeding tube tip in the gastric fundus. Electronically Signed   By: Signa Kellaylor  Stroud M.D.   On: 06/21/2021 11:11   DG Abd Portable 1V  Result Date: 06/17/2021 CLINICAL DATA:  Feeding tube placement. EXAM: PORTABLE ABDOMEN - 1 VIEW COMPARISON:  None. FINDINGS: Tip of the weighted enteric tube in the right upper quadrant in the region of the distal stomach. There is air within nondilated bowel loops in  the upper abdomen. No obvious free air. IMPRESSION: Tip of the weighted enteric tube in the right upper quadrant in the region of the distal stomach. Electronically Signed   By: Narda RutherfordMelanie  Sanford M.D.   On: 06/17/2021 15:01   DG Swallowing Func-Speech Pathology  Result Date: 06/24/2021 Table formatting from the original result was not included. Objective Swallowing Evaluation: Type of Study: MBS-Modified Barium Swallow Study  Patient Details Name: Heron NayLinda T Plaisted MRN: [id number] Date of Birth: 02/11/1946 Today's Date: 06/24/2021 Time: SLP Start Time (ACUTE ONLY): 1230 -SLP Stop Time (ACUTE ONLY): 1300 SLP Time Calculation (min) (ACUTE ONLY): 30 min Past Medical History: No past medical history on file. Past Surgical History: Past Surgical History: Procedure Laterality Date  ANTERIOR CERVICAL DECOMP/DISCECTOMY FUSION N/A 06/16/2021  Procedure: ANTERIOR CERVICAL DISCECTOMY FUSION Cervical five -Cervical six, Evacuation of Epidural Abscess;  Surgeon: Jadene Pierinistergard, Thomas A, MD;  Location: [facility] OR;  Service: Neurosurgery;  Laterality: N/A; HPI: Pt is a 75yo female [facility] after a fall on 7/29. Pt also fell on 8/2 while in the ED. Pt found to be hypokalemic upon admission. 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 Pt is now s/p drainage of abscess and C5-6 ACDF on 06/16/21. Also noted R SI septic arthritis.  PMH: HTN, polycthemia, alcohol use.  Subjective: alerts to name, responds to questions Assessment / Plan / Recommendation CHL IP CLINICAL IMPRESSIONS 06/24/2021 Clinical Impression Ms. Kathaleen Burybney presents with a moderate dysphagia, impacted by both mechanical and cognitive components.  She was intermittently lethargic throughout the assessment.  Initially, she held her neck in deep flexion, which appeared to offer some protection when swallowing thin liquids - there was no penetration nor aspiration.  Later in the study, when sleepier  and when head was in extension and neutral, thin liquids were immediately aspirated with an ensuing cough response. Purees sat in pharynx and pt was unable to move them due to poor wakefulness at that point in the study. Study was terminated and oral suctioning was provided.  Recommend continuing intensive oral care; offer ice chips after oral care.  Maintain NPO status for now.  SLP will follow for therapeutic PO trials -  as mentation improves we will have a clearer understanding of potential impact of ACDF/prevertebral swelling on swallowing.  Marland Kitchen. SLP Visit  Diagnosis Dysphagia, pharyngeal phase (R13.13) Attention and concentration deficit following -- Frontal lobe and executive function deficit following -- Impact on safety and function Moderate aspiration risk   CHL IP TREATMENT RECOMMENDATION 06/24/2021 Treatment Recommendations Therapy as outlined in treatment plan below   Prognosis 06/24/2021 Prognosis for Safe Diet Advancement Fair Barriers to Reach Goals Cognitive deficits Barriers/Prognosis Comment -- CHL IP DIET RECOMMENDATION 06/24/2021 SLP Diet Recommendations NPO Liquid Administration via -- Medication Administration -- Compensations -- Postural Changes --   CHL IP OTHER RECOMMENDATIONS 06/24/2021 Recommended Consults -- Oral Care Recommendations Oral care QID Other Recommendations --   CHL IP FOLLOW UP RECOMMENDATIONS 06/24/2021 Follow up Recommendations Skilled Nursing facility   [facility] IP FREQUENCY AND DURATION 06/24/2021 Speech Therapy Frequency (ACUTE ONLY) min 2x/week Treatment Duration 2 weeks      CHL IP ORAL PHASE 06/24/2021 Oral Phase WFL Oral - Pudding Teaspoon -- Oral - Pudding Cup -- Oral - Honey Teaspoon -- Oral - Honey Cup -- Oral - Nectar Teaspoon -- Oral - Nectar Cup -- Oral - Nectar Straw -- Oral - Thin Teaspoon -- Oral - Thin Cup -- Oral - Thin Straw -- Oral - Puree -- Oral - Mech Soft -- Oral - Regular -- Oral - Multi-Consistency -- Oral - Pill -- Oral Phase - Comment --  CHL IP PHARYNGEAL  PHASE 06/24/2021 Pharyngeal Phase Impaired Pharyngeal- Pudding Teaspoon -- Pharyngeal -- Pharyngeal- Pudding Cup -- Pharyngeal -- Pharyngeal- Honey Teaspoon -- Pharyngeal -- Pharyngeal- Honey Cup -- Pharyngeal -- Pharyngeal- Nectar Teaspoon -- Pharyngeal -- Pharyngeal- Nectar Cup -- Pharyngeal -- Pharyngeal- Nectar Straw -- Pharyngeal -- Pharyngeal- Thin Teaspoon -- Pharyngeal -- Pharyngeal- Thin Cup -- Pharyngeal -- Pharyngeal- Thin Straw -- Pharyngeal -- Pharyngeal- Puree -- Pharyngeal -- Pharyngeal- Mechanical Soft -- Pharyngeal -- Pharyngeal- Regular -- Pharyngeal -- Pharyngeal- Multi-consistency -- Pharyngeal -- Pharyngeal- Pill -- Pharyngeal -- Pharyngeal Comment --  No flowsheet data found. [name] 06/24/2021, 3:12 PM              ECHOCARDIOGRAM COMPLETE  Result Date: 06/18/2021    ECHOCARDIOGRAM REPORT   Patient Name:   ELVERTA DIMICELI Date of Exam: 06/18/2021 Medical Rec #:  [id number]     Height:       66.0 in Accession #:    [id number]    Weight:       188.0 lb Date of Birth:  06/08/1946      BSA:          1.948 m Patient Age:    75 years      BP:           133/70 mmHg Patient Gender: F             HR:           98 bpm. Exam Location:  Inpatient Procedure: 2D Echo, Cardiac Doppler and Color Doppler Indications:    Epidural abscess [[id number]]  History:        Patient has no prior history of Echocardiogram examinations.  Sonographer:    Renella Cunas RDCS Referring Phys: [id number] [facility] IMPRESSIONS  1. Left ventricular ejection fraction, by estimation, is 65 to 70%. The left ventricle has normal function. The left ventricle has no regional wall motion abnormalities. Left ventricular diastolic parameters are consistent with Grade I diastolic dysfunction (impaired relaxation).  2. Right ventricular systolic function is normal. The right ventricular size is normal. There is normal pulmonary artery systolic pressure.  3. The mitral valve is normal  in structure. Mild mitral valve regurgitation.  No evidence of mitral stenosis.  4. The aortic valve is normal in structure. Aortic valve regurgitation is mild. No aortic stenosis is present.  5. The inferior vena cava is normal in size with greater than 50% respiratory variability, suggesting right atrial pressure of 3 mmHg. FINDINGS  Left Ventricle: Left ventricular ejection fraction, by estimation, is 65 to 70%. The left ventricle has normal function. The left ventricle has no regional wall motion abnormalities. The left ventricular internal cavity size was normal in size. There is  no left ventricular hypertrophy. Left ventricular diastolic parameters are consistent with Grade I diastolic dysfunction (impaired relaxation). Right Ventricle: The right ventricular size is normal. No increase in right ventricular wall thickness. Right ventricular systolic function is normal. There is normal pulmonary artery systolic pressure. The tricuspid regurgitant velocity is 2.44 m/s, and  with an assumed right atrial pressure of 3 mmHg, the estimated right ventricular systolic pressure is 26.8 mmHg. Left Atrium: Left atrial size was normal in size. Right Atrium: Right atrial size was normal in size. Pericardium: There is no evidence of pericardial effusion. Mitral Valve: The mitral valve is normal in structure. Mild mitral valve regurgitation, with posteriorly-directed jet. No evidence of mitral valve stenosis. Tricuspid Valve: The tricuspid valve is normal in structure. Tricuspid valve regurgitation is mild . No evidence of tricuspid stenosis. Aortic Valve: The aortic valve is normal in structure. Aortic valve regurgitation is mild. No aortic stenosis is present. Pulmonic Valve: The pulmonic valve was normal in structure. Pulmonic valve regurgitation is not visualized. No evidence of pulmonic stenosis. Aorta: The aortic root is normal in size and structure. Venous: The inferior vena cava is normal in size with greater than 50% respiratory variability, suggesting right  atrial pressure of 3 mmHg. IAS/Shunts: No atrial level shunt detected by color flow Doppler.  LEFT VENTRICLE PLAX 2D LVIDd:         4.60 cm     Diastology LVIDs:         3.50 cm     LV e' medial:    6.96 cm/s LV PW:         1.10 cm     LV E/e' medial:  10.2 LV IVS:        1.10 cm     LV e' lateral:   7.62 cm/s LVOT diam:     2.20 cm     LV E/e' lateral: 9.4 LV SV:         92 LV SV Index:   47 LVOT Area:     3.80 cm  LV Volumes (MOD) LV vol d, MOD A2C: 79.4 ml LV vol d, MOD A4C: 83.8 ml LV vol s, MOD A2C: 32.1 ml LV vol s, MOD A4C: 33.3 ml LV SV MOD A2C:     47.3 ml LV SV MOD A4C:     83.8 ml LV SV MOD BP:      51.4 ml RIGHT VENTRICLE RV S prime:     17.40 cm/s TAPSE (M-mode): 2.2 cm LEFT ATRIUM           Index       RIGHT ATRIUM           Index LA diam:      3.30 cm 1.69 cm/m  RA Area:     13.30 cm LA Vol (A2C): 37.9 ml 19.46 ml/m RA Volume:   34.40 ml  17.66 ml/m LA Vol (A4C): 24.6 ml 12.63 ml/m  AORTIC VALVE LVOT Vmax:   131.00 cm/s LVOT Vmean:  81.400 cm/s LVOT VTI:    0.243 m  AORTA Ao Root diam: 3.30 cm MITRAL VALVE                TRICUSPID VALVE MV Area (PHT): 2.67 cm     TR Peak grad:   23.8 mmHg MV Decel Time: 284 msec     TR Vmax:        244.00 cm/s MR Peak grad: 82.4 mmHg MR Vmax:      454.00 cm/s   SHUNTS MV E velocity: 71.30 cm/s   Systemic VTI:  0.24 m MV A velocity: 102.00 cm/s  Systemic Diam: 2.20 cm MV E/A ratio:  0.70 Donato Schultz MD Electronically signed by Donato Schultz MD Signature Date/Time: 06/18/2021/1:08:50 PM    Final    VAS [place] LOWER EXTREMITY VENOUS (DVT)  Result Date: 07/07/2021  Lower Venous DVT Study Patient Name:  LIAM BOSSMAN  Date of Exam:   07/06/2021 Medical Rec #: [id number]      Accession #:    [id number] Date of Birth: 1946-05-23       Patient Gender: F Patient Age:   9 years Exam Location:  [facility] Procedure:      VAS [place] LOWER EXTREMITY VENOUS (DVT) Referring Phys: DAWOOD ELGERGAWY --------------------------------------------------------------------------------   Indications: Swelling.  Limitations: Altered mental status, inability to bend legs. Comparison Study: No prior study on file Performing Technologist: Sherren Kerns RVS  Examination Guidelines: A complete evaluation includes B-mode imaging, spectral Doppler, color Doppler, and power Doppler as needed of all accessible portions of each vessel. Bilateral testing is considered an integral part of a complete examination. Limited examinations for reoccurring indications may be performed as noted. The reflux portion of the exam is performed with the patient in reverse Trendelenburg.  +---------+---------------+---------+-----------+----------+-------------------+ RIGHT    CompressibilityPhasicitySpontaneityPropertiesThrombus Aging      +---------+---------------+---------+-----------+----------+-------------------+ CFV      Full                                                             +---------+---------------+---------+-----------+----------+-------------------+ SFJ      Full                                                             +---------+---------------+---------+-----------+----------+-------------------+ FV Prox  Full                                                             +---------+---------------+---------+-----------+----------+-------------------+ FV Mid   Full                                                             +---------+---------------+---------+-----------+----------+-------------------+ FV DistalFull                                                             +---------+---------------+---------+-----------+----------+-------------------+  PFV                                                   Not well visualized +---------+---------------+---------+-----------+----------+-------------------+ POP                     Yes      Yes                  patent by color and                                                       Doppler              +---------+---------------+---------+-----------+----------+-------------------+ PTV      Full                                                             +---------+---------------+---------+-----------+----------+-------------------+ PERO     Full                                                             +---------+---------------+---------+-----------+----------+-------------------+   +---------+---------------+---------+-----------+----------+-------------------+ LEFT     CompressibilityPhasicitySpontaneityPropertiesThrombus Aging      +---------+---------------+---------+-----------+----------+-------------------+ CFV      Full           Yes      Yes                                      +---------+---------------+---------+-----------+----------+-------------------+ SFJ      Full                                                             +---------+---------------+---------+-----------+----------+-------------------+ FV Prox  Full                                                             +---------+---------------+---------+-----------+----------+-------------------+ FV Mid   Full                                                             +---------+---------------+---------+-----------+----------+-------------------+ FV DistalFull                                                             +---------+---------------+---------+-----------+----------+-------------------+  PFV                                                   Not well visualized +---------+---------------+---------+-----------+----------+-------------------+ POP                                                   patent by color and                                                       Doppler             +---------+---------------+---------+-----------+----------+-------------------+ PTV      Full                                                              +---------+---------------+---------+-----------+----------+-------------------+ PERO     Full                                                             +---------+---------------+---------+-----------+----------+-------------------+     Summary: RIGHT: - There is no evidence of deep vein thrombosis in the lower extremity. However, portions of this examination were limited- see technologist comments above.  - Ultrasound characteristics of enlarged lymph nodes are noted in the groin.  LEFT: - There is no evidence of deep vein thrombosis in the lower extremity. However, portions of this examination were limited- see technologist comments above.  *See table(s) above for measurements and observations. Electronically signed by Sherald Hess MD on 07/07/2021 at 2:26:33 PM.    Final    VAS [place] UPPER EXTREMITY VENOUS DUPLEX  Result Date: 06/19/2021 UPPER VENOUS STUDY  Patient Name:  REIS PIENTA  Date of Exam:   06/19/2021 Medical Rec #: [id number]      Accession #:    [id number] Date of Birth: 05-18-1946       Patient Gender: F Patient Age:   13 years Exam Location:  [facility] Procedure:      VAS [place] UPPER EXTREMITY VENOUS DUPLEX Referring Phys: Odette Fraction --------------------------------------------------------------------------------  Indications: Pain, and Swelling Risk Factors: IV's in LUE (hand and forearm). Limitations: Poor ultrasound/tissue interface and IV bandage, and patient pain intolerance. Comparison Study: No previous exams Performing Technologist: Jody Hill RVT, RDMS  Examination Guidelines: A complete evaluation includes B-mode imaging, spectral Doppler, color Doppler, and power Doppler as needed of all accessible portions of each vessel. Bilateral testing is considered an integral part of a complete examination. Limited examinations for reoccurring indications may be performed as noted.  Right Findings: +----------+------------+---------+-----------+----------+-------+  RIGHT     CompressiblePhasicitySpontaneousPropertiesSummary +----------+------------+---------+-----------+----------+-------+ Subclavian    Full       Yes  Yes                      +----------+------------+---------+-----------+----------+-------+  Left Findings: +----------+------------+---------+-----------+----------+-------+ LEFT      CompressiblePhasicitySpontaneousPropertiesSummary +----------+------------+---------+-----------+----------+-------+ IJV           Full       Yes       Yes                      +----------+------------+---------+-----------+----------+-------+ Subclavian    Full       Yes       Yes                      +----------+------------+---------+-----------+----------+-------+ Axillary      Full       Yes       Yes                      +----------+------------+---------+-----------+----------+-------+ Brachial      Full       Yes       Yes                      +----------+------------+---------+-----------+----------+-------+ Radial        Full                                          +----------+------------+---------+-----------+----------+-------+ Ulnar         Full                                          +----------+------------+---------+-----------+----------+-------+ Cephalic      Full                                          +----------+------------+---------+-----------+----------+-------+ Basilic       Full       Yes       Yes                      +----------+------------+---------+-----------+----------+-------+ Limited visualization of ulnar and radial veins due to IV placement, edema, and patient pain intolerance.  Summary:  Right: No evidence of thrombosis in the subclavian.  Left: No evidence of deep vein thrombosis in the upper extremity. No evidence of superficial vein thrombosis in the upper extremity.  *See table(s) above for measurements and observations.  Diagnosing physician: Coral Else MD Electronically signed by Coral Else MD on 06/19/2021 at 6:54:25 PM.    Final     Assessment/Plan  1. Pruritus -  will start on hydroxyzine 25 mg 1 tab every 12 hours x2 weeks  2. MSSA (methicillin susceptible Staphylococcus aureus) infection/UTI-POA -Continue cefazolin 2 g every 8 hours via PICC and rifampin 300 mg every 12 hours    Family/ staff Communication: Discussed plan of care with charge nurse.  Labs/tests ordered: CBC with differentials, CMP  Goals of care:   Short-term care   Kenard Gower, DNP, MSN, FNP-BC [facility] [phone] (Monday-Friday 8:00 a.m. - 5:00 p.m.) [phone] (after hours)

## 2021-07-19 ENCOUNTER — Non-Acute Institutional Stay (SKILLED_NURSING_FACILITY): Payer: Self-pay | Admitting: Adult Health

## 2021-07-19 ENCOUNTER — Encounter: Payer: Self-pay | Admitting: Adult Health

## 2021-07-19 DIAGNOSIS — I959 Hypotension, unspecified: Secondary | ICD-10-CM

## 2021-07-19 DIAGNOSIS — A4901 Methicillin susceptible Staphylococcus aureus infection, unspecified site: Secondary | ICD-10-CM

## 2021-07-19 NOTE — Progress Notes (Signed)
Location:  [facility] Room Number: 116 A Place of Service:  SNF (31) Provider:  Kenard Gower, DNP, FNP-BC  Patient Care Team: Darrow Bussing, MD as PCP - General (Family Medicine)  Extended Emergency Contact Information Primary Emergency Contact: Hartsfield,Kimberly Address: [address]          [place] [address] [place] Phone: [phone] Relation: Daughter Secondary Emergency Contact: Schrum,Sidney Mobile Phone: [phone] Relation: Granddaughter  Code Status:  FULL CODE  Goals of care: Advanced Directive information Advanced Directives 07/19/2021  Does Patient Have a Medical Advance Directive? No     Chief Complaint  Patient presents with   Acute Visit    Follow up sepsis    HPI:  Pt is a 75 y.o. female seen today for an acute visit for sepsis follow up. Daughter called and requesting for blood culture. There was no reported fever. She is currently on Cefazolin 2 gm every 8 hours via PICC line and Rifampin 300 mg every 12 hours for cervical discitis/osteomyelitis/severe sepsis secondary to MSSA. Patient was seen in her room today. She was sleeping and woke up to verbal greetings. She was requesting for her back to be scratched. She was recently started on Hydroxyzine for pruritus.    History reviewed. No pertinent past medical history. Past Surgical History:  Procedure Laterality Date   ANTERIOR CERVICAL DECOMP/DISCECTOMY FUSION N/A 06/16/2021   Procedure: ANTERIOR CERVICAL DISCECTOMY FUSION Cervical five -Cervical six, Evacuation of Epidural Abscess;  Surgeon: Jadene Pierini, MD;  Location: [facility] OR;  Service: Neurosurgery;  Laterality: N/A;   IR GASTROSTOMY TUBE MOD SED  07/02/2021    No Known Allergies  Outpatient Encounter Medications as of 07/19/2021  Medication Sig   acetaminophen (TYLENOL) 160 MG/5ML solution Place 20.3 mLs (650 mg total) into feeding tube every 6 (six) hours.   allopurinol (ZYLOPRIM)  100 MG tablet Take 100 mg by mouth daily.   bisacodyl (DULCOLAX) 10 MG suppository Place 10 mg rectally as needed for moderate constipation.   ceFAZolin (ANCEF) 2-4 GM/100ML-% IVPB Inject 100 mLs (2 g total) into the vein every 8 (eight) hours.   diclofenac Sodium (VOLTAREN) 1 % GEL Apply 4 g topically 4 (four) times daily. To neck   folic acid (FOLVITE) 1 MG tablet Place 1 tablet (1 mg total) into feeding tube daily.   hydrOXYzine (ATARAX/VISTARIL) 25 MG tablet Take 25 mg by mouth 3 (three) times daily as needed.   Magnesium Hydroxide (MILK OF MAGNESIA PO) Take 30 mLs by mouth as needed.   melatonin 3 MG TABS tablet Place 1 tablet (3 mg total) into feeding tube at bedtime as needed.   midodrine (PROAMATINE) 5 MG tablet Place 1 tablet (5 mg total) into feeding tube 3 (three) times daily with meals.   Multiple Vitamin (MULTIVITAMIN) LIQD Place 15 mLs into feeding tube daily.   niacin 500 MG tablet Place 1 tablet (500 mg total) into feeding tube at bedtime.   Nutritional Supplements (FEEDING SUPPLEMENT, OSMOLITE 1.2 CAL,) LIQD Place 1,000 mLs into feeding tube continuous.   Nutritional Supplements (FEEDING SUPPLEMENT, PROSOURCE TF,) liquid Place 45 mLs into feeding tube 2 (two) times daily.   oxyCODONE (OXY IR/ROXICODONE) 5 MG immediate release tablet Take 1 tablet (5 mg total) by mouth every 6 (six) hours as needed for breakthrough pain or severe pain. (Patient taking differently: Take 5 mg by mouth every 12 (twelve) hours as needed for breakthrough pain or severe pain.)   polyethylene glycol (MIRALAX /  GLYCOLAX) 17 g packet Place 17 g into feeding tube daily as needed for mild constipation or severe constipation.   rifampin (RIFADIN) 300 MG capsule Take 1 capsule (300 mg total) by mouth every 12 (twelve) hours.   Sodium Phosphates (RA SALINE ENEMA RE) Place rectally as needed.   thiamine 100 MG tablet Place 1 tablet (100 mg total) into feeding tube daily.   Water For Irrigation, Sterile (FREE  WATER) SOLN Place 125 mLs into feeding tube every 4 (four) hours.   [DISCONTINUED] allopurinol (ZYLOPRIM) 300 MG tablet Place 1 tablet (300 mg total) into feeding tube daily.   No facility-administered encounter medications on file as of 07/19/2021.    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   Immunization History  Administered Date(s) Administered   PFIZER Comirnaty(Gray Top)Covid-19 Tri-Sucrose Vaccine 07/02/2021   PFIZER(Purple Top)SARS-COV-2 Vaccination 02/06/2020, 02/28/2020   Pertinent  Health Maintenance Due  Topic Date Due   COLONOSCOPY (Pts 45-87yrs Insurance coverage will need to be confirmed)  Never done   DEXA SCAN  Never done   PNA vac Low Risk Adult (1 of 2 - PCV13) Never done   INFLUENZA VACCINE  Never done   No flowsheet data found.   Vitals:   07/19/21 1531  BP: 103/60  Pulse: 98  Resp: 17  Temp: 97.7 F (36.5 C)  Weight: 185 lb 4.8 oz (84.1 kg)  Height: 5\' 6"  (1.676 m)   Body mass index is 29.91 kg/m.  Physical Exam  GENERAL APPEARANCE: Well nourished. In no acute distress. Obese. SKIN:  has sacral wound stage 2 and maceration on bilateral buttocks. MOUTH and THROAT: Lips are without lesions. Oral mucosa is moist and without lesions.  RESPIRATORY: Breathing is even & unlabored, BS CTAB CARDIAC: RRR, no murmur,no extra heart sounds GI: Abdomen soft, normal BS, no masses, no tenderness NEUROLOGICAL: There is no tremor. Speech is clear. Alert to self, disoriented to time and place. PSYCHIATRIC:  Affect and behavior are appropriate  Labs reviewed: Recent Labs    06/13/21 0306 06/16/21 0439 07/06/21 0217 07/07/21 0258 07/08/21 0311 07/09/21 0206 07/10/21 0115  NA 136   < > 136 137 135 135 134*  K 4.3   < > 4.4 4.2 4.4 4.2 4.2  CL 100   < > 107 106 105 104 104  CO2 22   < > 20* 20* 20* 21* 21*  GLUCOSE 84   < > 101* 111* 107* 118* 114*  BUN 40*   < > 28* 27* 27* 28* 24*  CREATININE 1.62*   < > 1.14* 1.02* 1.03*  1.03* 1.03*  CALCIUM 9.8   < > 8.3* 8.7* 8.8* 8.7* 8.6*  MG 2.1  --  1.6*  --  1.8  --   --   PHOS  --    < > 3.3 3.8 3.9  --   --    < > = values in this interval not displayed.   Recent Labs    07/07/21 0258 07/08/21 0311 07/09/21 0206  AST 18 21 22   ALT 9 8 8   ALKPHOS 173* 165* 160*  BILITOT 1.0 1.0 1.1  PROT 6.0* 6.4* 5.8*  ALBUMIN 1.4* 1.5* 1.3*   Recent Labs    07/09/21 0206 07/10/21 0115 07/11/21 0722  WBC 8.6 8.9 11.2*  NEUTROABS 5.9 6.0 7.2  HGB 8.2* 8.5* 8.8*  HCT 26.3* 27.0* 27.5*  MCV 98.9 98.5 97.5  PLT 226 247 246   Lab Results  Component  Value Date   TSH 3.885 06/14/2021   No results found for: HGBA1C No results found for: CHOL, HDL, LDLCALC, LDLDIRECT, TRIG, CHOLHDL  Significant Diagnostic Results in last 30 days:  CT ABDOMEN WO CONTRAST  Result Date: 06/27/2021 CLINICAL DATA:  assess anatomy for gastrostomy tube EXAM: CT ABDOMEN WITHOUT CONTRAST TECHNIQUE: Multidetector CT imaging of the abdomen was performed following the standard protocol without IV contrast. COMPARISON:  None. FINDINGS: Lower chest: Small bilateral pleural effusions with bibasilar atelectasis. Hepatobiliary: No focal liver abnormality is seen. No gallstones, gallbladder wall thickening, or biliary dilatation. Pancreas: Unremarkable. No pancreatic ductal dilatation or surrounding inflammatory changes. Spleen: Normal in size without focal abnormality. Adrenals/Urinary Tract: Adrenal glands are unremarkable. No hydronephrosis. There is a 3.0 cm low-density lesion in the left kidney which is compatible with a cyst. Stomach/Bowel: The stomach is within normal limits. There is a feeding tube in place with tip in the distal stomach. There is no other bowel interposed between the stomach and the abdominal wall, though the transverse colon is in close proximity. There is no evidence of bowel obstruction. The appendix is normal. Scattered colonic diverticuli. No bowel wall thickening or adjacent  inflammatory changes. Vascular/Lymphatic: Aortoiliac atherosclerotic calcifications. No AAA. No lymphadenopathy. Other: No abdominal wall hernia or abnormality. No abdominal ascites. Musculoskeletal: No acute osseous abnormality. No suspicious lytic or blastic lesions. Multilevel degenerative changes of the spine, with severe lower lumbar predominant facet arthropathy. Likely chronic right SI joint subchondral erosive change, with sclerotic margins. IMPRESSION: No acute abnormality in the abdomen. Feeding tube is in place with tip in the distal stomach. No other bowel is interposed between the stomach and the anterior abdominal wall, though the transverse colon is in close proximity. Small bilateral pleural effusions with adjacent bibasilar atelectasis. Electronically Signed   By: Caprice Renshaw M.D.   On: 06/27/2021 16:18   IR GASTROSTOMY TUBE MOD SED  Result Date: 07/02/2021 INDICATION: 75 year old female with dysphagia. She requires placement of a percutaneous gastrostomy tube. EXAM: Fluoroscopically guided placement of percutaneous pull-through gastrostomy tube Interventional Radiologist:  Sterling Big, MD MEDICATIONS: In patient currently receiving Ancef every 12 hours. No additional antibiotic prophylaxis administered. ANESTHESIA/SEDATION: Versed 1.5 mg IV; Fentanyl 75 mcg IV Moderate Sedation Time:  13 minutes The patient was continuously monitored during the procedure by the interventional radiology nurse under my direct supervision. CONTRAST:  10mL OMNIPAQUE IOHEXOL 240 MG/ML SOLN FLUOROSCOPY TIME:  Fluoroscopy Time: 2 minutes 12 seconds (13 mGy). COMPLICATIONS: None immediate. PROCEDURE: Informed written consent was obtained from the patient after a thorough discussion of the procedural risks, benefits and alternatives. All questions were addressed. Maximal Sterile Barrier Technique was utilized including caps, mask, sterile gowns, sterile gloves, sterile drape, hand hygiene and skin antiseptic. A  timeout was performed prior to the initiation of the procedure. Maximal barrier sterile technique utilized including caps, mask, sterile gowns, sterile gloves, large sterile drape, hand hygiene, and chlorhexadine skin prep. An angled catheter was advanced over a wire under fluoroscopic guidance through the nose, down the esophagus and into the body of the stomach. The stomach was then insufflated with several 100 ml of air. Fluoroscopy confirmed location of the gastric bubble, as well as inferior displacement of the barium stained colon. Under direct fluoroscopic guidance, a single T-tack was placed, and the anterior gastric wall drawn up against the anterior abdominal wall. Percutaneous access was then obtained into the mid gastric body with an 18 gauge sheath needle. Aspiration of air, and injection of contrast material  under fluoroscopy confirmed needle placement. An Amplatz wire was advanced in the gastric body and the access needle exchanged for a 9-French vascular sheath. A snare device was advanced through the vascular sheath and an Amplatz wire advanced through the angled catheter. The Amplatz wire was successfully snared and this was pulled up through the esophagus and out the mouth. A 20-French Burnell Blanks MIC-PEG tube was then connected to the snare and pulled through the mouth, down the esophagus, into the stomach and out to the anterior abdominal wall. Hand injection of contrast material confirmed intragastric location. The T-tack retention suture was then cut. The pull through peg tube was then secured with the external bumper and capped. The patient will be observed for several hours with the newly placed tube on low wall suction to evaluate for any post procedure complication. The patient tolerated the procedure well, there is no immediate complication. IMPRESSION: Successful placement of a 20 French pull through gastrostomy tube. Electronically Signed   By: Malachy Moan M.D.   On: 07/02/2021  16:01   CT SHOULDER LEFT WO CONTRAST  Result Date: 07/10/2021 CLINICAL DATA:  Acute pain EXAM: CT OF THE UPPER LEFT EXTREMITY WITHOUT CONTRAST TECHNIQUE: Multidetector CT imaging of the upper left extremity was performed according to the standard protocol. COMPARISON:  None. FINDINGS: Bones/Joint/Cartilage There is no evidence of acute fracture or dislocation. The shoulder is held in internal rotation. There is moderate glenohumeral osteoarthritis with subchondral sclerosis and cystic change. There is preserved glenoid bone stock and neutral glenoid version. There is mild widening of the [facility] joint and mild degenerative change. Partially visualized ACDF hardware. Ligaments Suboptimally assessed by CT. Muscles and Tendons Grade 1 muscle atrophy of the cuff musculature. There is mineralization along the expected course of the coracoclavicular ligaments. Soft tissues No focal fluid collection. There are a few prominent left axillary lymph nodes. IMPRESSION: No acute fracture or dislocation. Moderate glenohumeral osteoarthritis. Widening of the [facility] joint with mild degenerative change and mineralization along the course of the coracoclavicular ligaments, which can be seen with chronic AC joint injury. Electronically Signed   By: Caprice Renshaw M.D.   On: 07/10/2021 08:30   CT ELBOW LEFT WO CONTRAST  Result Date: 07/10/2021 CLINICAL DATA:  Acute pain EXAM: CT OF THE UPPER LEFT EXTREMITY WITHOUT CONTRAST TECHNIQUE: Multidetector CT imaging of the upper left extremity was performed according to the standard protocol. COMPARISON:  None. FINDINGS: Bones/Joint/Cartilage There is no acute fracture or dislocation. Elbow alignment is normal. There is no significant degenerative change. There is no joint effusion. Ligaments Suboptimally assessed by CT. Muscles and Tendons No significant muscle atrophy. Soft tissues There is superficial soft tissue swelling. No focal fluid collection on noncontrast CT. IMPRESSION: Mild  generalized superficial soft tissue swelling along the elbow. No acute osseous abnormality or joint effusion. Electronically Signed   By: Caprice Renshaw M.D.   On: 07/10/2021 08:34   DG CHEST PORT 1 VIEW  Result Date: 06/30/2021 CLINICAL DATA:  Chest xray ordered because of fever. EXAM: PORTABLE CHEST - 1 VIEW COMPARISON:  06/14/2021 FINDINGS: Increase in ill-defined interstitial and airspace opacities in the lung bases, left greater than right. Heart size and mediastinal contours are within normal limits. Blunting of left lateral costophrenic angle suggesting small effusion. No pneumothorax. Surgical fixation hardware partially seen. Feeding tube extends at least as far as the stomach, tip not seen. IMPRESSION: Worsening bibasilar infiltrates or edema, left greater than right. Possible small left effusion. Electronically Signed   By:  Corlis Leak  Hassell M.D.   On: 06/30/2021 12:16   DG Abd Portable 1V  Result Date: 06/21/2021 CLINICAL DATA:  Nasogastric tube placement. EXAM: PORTABLE ABDOMEN - 1 VIEW COMPARISON:  None. FINDINGS: The bowel gas pattern is normal. Feeding tube tip is seen in expected position of distal stomach. No radio-opaque calculi or other significant radiographic abnormality are seen. IMPRESSION: Feeding tube tip seen in expected position of distal stomach. Electronically Signed   By: Lupita RaiderJames  Green Jr M.D.   On: 06/21/2021 14:39   DG Abd Portable 1V  Result Date: 06/21/2021 CLINICAL DATA:  Evaluate feeding tube placement EXAM: PORTABLE ABDOMEN - 1 VIEW COMPARISON:  06/17/2021 FINDINGS: The feeding tube tip is 8.2 cm below the GE junction in the expected location of the gastric fundus. Normal bowel gas pattern. IMPRESSION: Feeding tube tip in the gastric fundus. Electronically Signed   By: Signa Kellaylor  Stroud M.D.   On: 06/21/2021 11:11   DG Swallowing Func-Speech Pathology  Result Date: 06/24/2021 Table formatting from the original result was not included. Objective Swallowing Evaluation: Type of  Study: MBS-Modified Barium Swallow Study  Patient Details Name: Heron NayLinda T Vannatter MRN: [id number] Date of Birth: 06/29/1946 Today's Date: 06/24/2021 Time: SLP Start Time (ACUTE ONLY): 1230 -SLP Stop Time (ACUTE ONLY): 1300 SLP Time Calculation (min) (ACUTE ONLY): 30 min Past Medical History: No past medical history on file. Past Surgical History: Past Surgical History: Procedure Laterality Date  ANTERIOR CERVICAL DECOMP/DISCECTOMY FUSION N/A 06/16/2021  Procedure: ANTERIOR CERVICAL DISCECTOMY FUSION Cervical five -Cervical six, Evacuation of Epidural Abscess;  Surgeon: Jadene Pierinistergard, Thomas A, MD;  Location: [facility] OR;  Service: Neurosurgery;  Laterality: N/A; HPI: [facility] 75yo [facility] a fall on 7/29. Pt also fell on 8/2 while in the ED. Pt found to be hypokalemic upon admission. 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 Pt is now s/p drainage of abscess and C5-6 ACDF on 06/16/21. Also noted R SI septic arthritis.  PMH: HTN, polycthemia, alcohol use.  Subjective: alerts to name, responds to questions Assessment / Plan / Recommendation CHL IP CLINICAL IMPRESSIONS 06/24/2021 Clinical Impression Ms. Kathaleen Burybney presents with a moderate dysphagia, impacted by both mechanical and cognitive components.  She was intermittently lethargic throughout the assessment.  Initially, she held her neck in deep flexion, which appeared to offer some protection when swallowing thin liquids - there was no penetration nor aspiration.  Later in the study, when sleepier and when head was in extension and neutral, thin liquids were immediately aspirated with an ensuing cough response. Purees sat in pharynx and pt was unable to move them due to poor wakefulness at that point in the study. Study was terminated and oral suctioning was provided.  Recommend continuing intensive oral care; offer ice chips after oral care.  Maintain NPO status for now.  SLP will  follow for therapeutic PO trials -  as mentation improves we will have a clearer understanding of potential impact of ACDF/prevertebral swelling on swallowing.  Marland Kitchen. SLP Visit Diagnosis Dysphagia, pharyngeal phase (R13.13) Attention and concentration deficit following -- Frontal lobe and executive function deficit following -- Impact on safety and function Moderate aspiration risk   CHL IP TREATMENT RECOMMENDATION 06/24/2021 Treatment Recommendations Therapy as outlined in treatment plan below   Prognosis 06/24/2021 Prognosis for Safe Diet Advancement Fair Barriers to Reach Goals Cognitive deficits Barriers/Prognosis Comment -- CHL IP DIET RECOMMENDATION 06/24/2021 SLP Diet Recommendations NPO Liquid Administration via --  Medication Administration -- Compensations -- Postural Changes --   CHL IP OTHER RECOMMENDATIONS 06/24/2021 Recommended Consults -- Oral Care Recommendations Oral care QID Other Recommendations --   CHL IP FOLLOW UP RECOMMENDATIONS 06/24/2021 Follow up Recommendations Skilled Nursing facility   [facility] IP FREQUENCY AND DURATION 06/24/2021 Speech Therapy Frequency (ACUTE ONLY) min 2x/week Treatment Duration 2 weeks      CHL IP ORAL PHASE 06/24/2021 Oral Phase WFL Oral - Pudding Teaspoon -- Oral - Pudding Cup -- Oral - Honey Teaspoon -- Oral - Honey Cup -- Oral - Nectar Teaspoon -- Oral - Nectar Cup -- Oral - Nectar Straw -- Oral - Thin Teaspoon -- Oral - Thin Cup -- Oral - Thin Straw -- Oral - Puree -- Oral - Mech Soft -- Oral - Regular -- Oral - Multi-Consistency -- Oral - Pill -- Oral Phase - Comment --  CHL IP PHARYNGEAL PHASE 06/24/2021 Pharyngeal Phase Impaired Pharyngeal- Pudding Teaspoon -- Pharyngeal -- Pharyngeal- Pudding Cup -- Pharyngeal -- Pharyngeal- Honey Teaspoon -- Pharyngeal -- Pharyngeal- Honey Cup -- Pharyngeal -- Pharyngeal- Nectar Teaspoon -- Pharyngeal -- Pharyngeal- Nectar Cup -- Pharyngeal -- Pharyngeal- Nectar Straw -- Pharyngeal -- Pharyngeal- Thin Teaspoon -- Pharyngeal -- Pharyngeal-  Thin Cup -- Pharyngeal -- Pharyngeal- Thin Straw -- Pharyngeal -- Pharyngeal- Puree -- Pharyngeal -- Pharyngeal- Mechanical Soft -- Pharyngeal -- Pharyngeal- Regular -- Pharyngeal -- Pharyngeal- Multi-consistency -- Pharyngeal -- Pharyngeal- Pill -- Pharyngeal -- Pharyngeal Comment --  No flowsheet data found. [name] 06/24/2021, 3:12 PM              VAS [place] LOWER EXTREMITY VENOUS (DVT)  Result Date: 07/07/2021  Lower Venous DVT Study Patient Name:  SHALESE STRAHAN  Date of Exam:   07/06/2021 Medical Rec #: [id number]      Accession #:    [id number] Date of Birth: 12/27/1945       Patient Gender: F Patient Age:   63 years Exam Location[facility]  [facility] Procedure:      VAS [place] LOWER EXTREMITY VENOUS (DVT) Referring Phys: DAWOOD ELGERGAWY --------------------------------------------------------------------------------  Indications: Swelling.  Limitations: Altered mental status, inability to bend legs. Comparison Study: No prior study on file Performing Technologist: Sherren Kerns RVS  Examination Guidelines: A complete evaluation includes B-mode imaging, spectral Doppler, color Doppler, and power Doppler as needed of all accessible portions of each vessel. Bilateral testing is considered an integral part of a complete examination. Limited examinations for reoccurring indications may be performed as noted. The reflux portion of the exam is performed with the patient in reverse Trendelenburg.  +---------+---------------+---------+-----------+----------+-------------------+ RIGHT    CompressibilityPhasicitySpontaneityPropertiesThrombus Aging      +---------+---------------+---------+-----------+----------+-------------------+ CFV      Full                                                             +---------+---------------+---------+-----------+----------+-------------------+ SFJ      Full                                                              +---------+---------------+---------+-----------+----------+-------------------+ FV Prox  Full                                                             +---------+---------------+---------+-----------+----------+-------------------+  FV Mid   Full                                                             +---------+---------------+---------+-----------+----------+-------------------+ FV DistalFull                                                             +---------+---------------+---------+-----------+----------+-------------------+ PFV                                                   Not well visualized +---------+---------------+---------+-----------+----------+-------------------+ POP                     Yes      Yes                  patent by color and                                                       Doppler             +---------+---------------+---------+-----------+----------+-------------------+ PTV      Full                                                             +---------+---------------+---------+-----------+----------+-------------------+ PERO     Full                                                             +---------+---------------+---------+-----------+----------+-------------------+   +---------+---------------+---------+-----------+----------+-------------------+ LEFT     CompressibilityPhasicitySpontaneityPropertiesThrombus Aging      +---------+---------------+---------+-----------+----------+-------------------+ CFV      Full           Yes      Yes                                      +---------+---------------+---------+-----------+----------+-------------------+ SFJ      Full                                                             +---------+---------------+---------+-----------+----------+-------------------+ FV Prox  Full                                                              +---------+---------------+---------+-----------+----------+-------------------+  FV Mid   Full                                                             +---------+---------------+---------+-----------+----------+-------------------+ FV DistalFull                                                             +---------+---------------+---------+-----------+----------+-------------------+ PFV                                                   Not well visualized +---------+---------------+---------+-----------+----------+-------------------+ POP                                                   patent by color and                                                       Doppler             +---------+---------------+---------+-----------+----------+-------------------+ PTV      Full                                                             +---------+---------------+---------+-----------+----------+-------------------+ PERO     Full                                                             +---------+---------------+---------+-----------+----------+-------------------+     Summary: RIGHT: - There is no evidence of deep vein thrombosis in the lower extremity. However, portions of this examination were limited- see technologist comments above.  - Ultrasound characteristics of enlarged lymph nodes are noted in the groin.  LEFT: - There is no evidence of deep vein thrombosis in the lower extremity. However, portions of this examination were limited- see technologist comments above.  *See table(s) above for measurements and observations. Electronically signed by Sherald Hess MD on 07/07/2021 at 2:26:33 PM.    Final     Assessment/Plan  1. MSSA (methicillin susceptible Staphylococcus aureus) infection/UTI-POA -  continue Cefazolin and Rifampin -  follow up with Infectious Disease -      2. Hypotension, unspecified hypotension type -  BP 103/60 -  continue  Midodrine 5 mg TID -  monitor BPs     Family/ staff Communication: Discussed plan of care with resident  and charge nurse.  Labs/tests ordered:   Urinalysis with culture and sensitivity, CBC and blood culture X 2  Goals of care:   Short-term care   Kenard Gower, DNP, MSN, FNP-BC [facility] [phone] (Monday-Friday 8:00 a.m. - 5:00 p.m.) [phone] (after hours)

## 2021-07-20 ENCOUNTER — Emergency Department (HOSPITAL_COMMUNITY): Payer: Medicare Other

## 2021-07-20 ENCOUNTER — Telehealth: Payer: Self-pay | Admitting: Adult Health

## 2021-07-20 ENCOUNTER — Encounter (HOSPITAL_COMMUNITY): Payer: Self-pay | Admitting: Emergency Medicine

## 2021-07-20 ENCOUNTER — Other Ambulatory Visit: Payer: Self-pay

## 2021-07-20 ENCOUNTER — Inpatient Hospital Stay (HOSPITAL_COMMUNITY)
Admission: EM | Admit: 2021-07-20 | Discharge: 2021-08-06 | DRG: 064 | Disposition: A | Discharge status: Skilled Nursing Facility | Payer: Medicare Other | Source: Skilled Nursing Facility | Attending: Internal Medicine | Admitting: Internal Medicine

## 2021-07-20 DIAGNOSIS — R532 Functional quadriplegia: Secondary | ICD-10-CM | POA: Diagnosis present

## 2021-07-20 DIAGNOSIS — L859 Epidermal thickening, unspecified: Secondary | ICD-10-CM | POA: Diagnosis present

## 2021-07-20 DIAGNOSIS — G061 Intraspinal abscess and granuloma: Secondary | ICD-10-CM | POA: Diagnosis present

## 2021-07-20 DIAGNOSIS — Z20822 Contact with and (suspected) exposure to covid-19: Secondary | ICD-10-CM | POA: Diagnosis present

## 2021-07-20 DIAGNOSIS — D631 Anemia in chronic kidney disease: Secondary | ICD-10-CM | POA: Diagnosis present

## 2021-07-20 DIAGNOSIS — M4804 Spinal stenosis, thoracic region: Secondary | ICD-10-CM | POA: Diagnosis present

## 2021-07-20 DIAGNOSIS — L89152 Pressure ulcer of sacral region, stage 2: Secondary | ICD-10-CM | POA: Diagnosis present

## 2021-07-20 DIAGNOSIS — M4624 Osteomyelitis of vertebra, thoracic region: Secondary | ICD-10-CM | POA: Diagnosis present

## 2021-07-20 DIAGNOSIS — M4649 Discitis, unspecified, multiple sites in spine: Secondary | ICD-10-CM | POA: Diagnosis present

## 2021-07-20 DIAGNOSIS — B961 Klebsiella pneumoniae [K. pneumoniae] as the cause of diseases classified elsewhere: Secondary | ICD-10-CM | POA: Diagnosis present

## 2021-07-20 DIAGNOSIS — I129 Hypertensive chronic kidney disease with stage 1 through stage 4 chronic kidney disease, or unspecified chronic kidney disease: Secondary | ICD-10-CM | POA: Diagnosis present

## 2021-07-20 DIAGNOSIS — G928 Other toxic encephalopathy: Secondary | ICD-10-CM | POA: Diagnosis present

## 2021-07-20 DIAGNOSIS — E440 Moderate protein-calorie malnutrition: Secondary | ICD-10-CM | POA: Diagnosis present

## 2021-07-20 DIAGNOSIS — E876 Hypokalemia: Secondary | ICD-10-CM | POA: Diagnosis not present

## 2021-07-20 DIAGNOSIS — I619 Nontraumatic intracerebral hemorrhage, unspecified: Secondary | ICD-10-CM | POA: Insufficient documentation

## 2021-07-20 DIAGNOSIS — N390 Urinary tract infection, site not specified: Secondary | ICD-10-CM | POA: Diagnosis present

## 2021-07-20 DIAGNOSIS — Z79899 Other long term (current) drug therapy: Secondary | ICD-10-CM

## 2021-07-20 DIAGNOSIS — R531 Weakness: Secondary | ICD-10-CM

## 2021-07-20 DIAGNOSIS — M4620 Osteomyelitis of vertebra, site unspecified: Secondary | ICD-10-CM

## 2021-07-20 DIAGNOSIS — Z981 Arthrodesis status: Secondary | ICD-10-CM | POA: Diagnosis not present

## 2021-07-20 DIAGNOSIS — I6319 Cerebral infarction due to embolism of other precerebral artery: Secondary | ICD-10-CM | POA: Diagnosis present

## 2021-07-20 DIAGNOSIS — M4626 Osteomyelitis of vertebra, lumbar region: Secondary | ICD-10-CM | POA: Diagnosis present

## 2021-07-20 DIAGNOSIS — I878 Other specified disorders of veins: Secondary | ICD-10-CM | POA: Diagnosis present

## 2021-07-20 DIAGNOSIS — N182 Chronic kidney disease, stage 2 (mild): Secondary | ICD-10-CM | POA: Diagnosis present

## 2021-07-20 DIAGNOSIS — R296 Repeated falls: Secondary | ICD-10-CM | POA: Diagnosis present

## 2021-07-20 DIAGNOSIS — Z87440 Personal history of urinary (tract) infections: Secondary | ICD-10-CM

## 2021-07-20 DIAGNOSIS — J9811 Atelectasis: Secondary | ICD-10-CM | POA: Diagnosis present

## 2021-07-20 DIAGNOSIS — M4625 Osteomyelitis of vertebra, thoracolumbar region: Secondary | ICD-10-CM | POA: Diagnosis present

## 2021-07-20 DIAGNOSIS — E162 Hypoglycemia, unspecified: Secondary | ICD-10-CM | POA: Diagnosis not present

## 2021-07-20 DIAGNOSIS — Z79890 Hormone replacement therapy (postmenopausal): Secondary | ICD-10-CM

## 2021-07-20 DIAGNOSIS — M4623 Osteomyelitis of vertebra, cervicothoracic region: Secondary | ICD-10-CM | POA: Diagnosis present

## 2021-07-20 DIAGNOSIS — Z8661 Personal history of infections of the central nervous system: Secondary | ICD-10-CM

## 2021-07-20 DIAGNOSIS — I6330 Cerebral infarction due to thrombosis of unspecified cerebral artery: Secondary | ICD-10-CM | POA: Insufficient documentation

## 2021-07-20 DIAGNOSIS — R29711 NIHSS score 11: Secondary | ICD-10-CM | POA: Diagnosis present

## 2021-07-20 DIAGNOSIS — G062 Extradural and subdural abscess, unspecified: Secondary | ICD-10-CM | POA: Diagnosis present

## 2021-07-20 DIAGNOSIS — M6008 Infective myositis, other site: Secondary | ICD-10-CM | POA: Diagnosis present

## 2021-07-20 DIAGNOSIS — J189 Pneumonia, unspecified organism: Secondary | ICD-10-CM

## 2021-07-20 DIAGNOSIS — M009 Pyogenic arthritis, unspecified: Secondary | ICD-10-CM | POA: Diagnosis present

## 2021-07-20 DIAGNOSIS — R1310 Dysphagia, unspecified: Secondary | ICD-10-CM | POA: Diagnosis present

## 2021-07-20 DIAGNOSIS — I6340 Cerebral infarction due to embolism of unspecified cerebral artery: Secondary | ICD-10-CM | POA: Insufficient documentation

## 2021-07-20 DIAGNOSIS — F0390 Unspecified dementia without behavioral disturbance: Secondary | ICD-10-CM | POA: Diagnosis present

## 2021-07-20 DIAGNOSIS — M4622 Osteomyelitis of vertebra, cervical region: Secondary | ICD-10-CM | POA: Diagnosis present

## 2021-07-20 DIAGNOSIS — B9561 Methicillin susceptible Staphylococcus aureus infection as the cause of diseases classified elsewhere: Secondary | ICD-10-CM | POA: Diagnosis present

## 2021-07-20 DIAGNOSIS — R471 Dysarthria and anarthria: Secondary | ICD-10-CM | POA: Diagnosis present

## 2021-07-20 DIAGNOSIS — Z7189 Other specified counseling: Secondary | ICD-10-CM

## 2021-07-20 DIAGNOSIS — G9341 Metabolic encephalopathy: Secondary | ICD-10-CM | POA: Diagnosis present

## 2021-07-20 DIAGNOSIS — R197 Diarrhea, unspecified: Secondary | ICD-10-CM | POA: Diagnosis not present

## 2021-07-20 DIAGNOSIS — I609 Nontraumatic subarachnoid hemorrhage, unspecified: Secondary | ICD-10-CM

## 2021-07-20 DIAGNOSIS — E785 Hyperlipidemia, unspecified: Secondary | ICD-10-CM | POA: Diagnosis present

## 2021-07-20 DIAGNOSIS — Z931 Gastrostomy status: Secondary | ICD-10-CM

## 2021-07-20 DIAGNOSIS — E8809 Other disorders of plasma-protein metabolism, not elsewhere classified: Secondary | ICD-10-CM | POA: Diagnosis present

## 2021-07-20 DIAGNOSIS — M48061 Spinal stenosis, lumbar region without neurogenic claudication: Secondary | ICD-10-CM | POA: Diagnosis present

## 2021-07-20 DIAGNOSIS — L26 Exfoliative dermatitis: Secondary | ICD-10-CM | POA: Diagnosis present

## 2021-07-20 DIAGNOSIS — M4802 Spinal stenosis, cervical region: Secondary | ICD-10-CM | POA: Diagnosis present

## 2021-07-20 LAB — CBC WITH DIFFERENTIAL/PLATELET
Abs Immature Granulocytes: 0.09 10*3/uL — ABNORMAL HIGH (ref 0.00–0.07)
Basophils Absolute: 0 10*3/uL (ref 0.0–0.1)
Basophils Relative: 0 %
Eosinophils Absolute: 1.8 10*3/uL — ABNORMAL HIGH (ref 0.0–0.5)
Eosinophils Relative: 14 %
HCT: 26 % — ABNORMAL LOW (ref 36.0–46.0)
Hemoglobin: 8 g/dL — ABNORMAL LOW (ref 12.0–15.0)
Immature Granulocytes: 1 %
Lymphocytes Relative: 7 %
Lymphs Abs: 0.9 10*3/uL (ref 0.7–4.0)
MCH: 30.9 pg (ref 26.0–34.0)
MCHC: 30.8 g/dL (ref 30.0–36.0)
MCV: 100.4 fL — ABNORMAL HIGH (ref 80.0–100.0)
Monocytes Absolute: 0.8 10*3/uL (ref 0.1–1.0)
Monocytes Relative: 7 %
Neutro Abs: 8.8 10*3/uL — ABNORMAL HIGH (ref 1.7–7.7)
Neutrophils Relative %: 71 %
Platelets: 212 10*3/uL (ref 150–400)
RBC: 2.59 MIL/uL — ABNORMAL LOW (ref 3.87–5.11)
RDW: 16.6 % — ABNORMAL HIGH (ref 11.5–15.5)
WBC: 12.5 10*3/uL — ABNORMAL HIGH (ref 4.0–10.5)
nRBC: 0 % (ref 0.0–0.2)

## 2021-07-20 LAB — URINALYSIS, ROUTINE W REFLEX MICROSCOPIC
Bilirubin Urine: NEGATIVE
Glucose, UA: NEGATIVE mg/dL
Ketones, ur: NEGATIVE mg/dL
Nitrite: POSITIVE — AB
Protein, ur: NEGATIVE mg/dL
Specific Gravity, Urine: 1.01 (ref 1.005–1.030)
pH: 7 (ref 5.0–8.0)

## 2021-07-20 LAB — I-STAT CHEM 8, ED
BUN: 24 mg/dL — ABNORMAL HIGH (ref 8–23)
Calcium, Ion: 1.15 mmol/L (ref 1.15–1.40)
Chloride: 105 mmol/L (ref 98–111)
Creatinine, Ser: 0.6 mg/dL (ref 0.44–1.00)
Glucose, Bld: 80 mg/dL (ref 70–99)
HCT: 24 % — ABNORMAL LOW (ref 36.0–46.0)
Hemoglobin: 8.2 g/dL — ABNORMAL LOW (ref 12.0–15.0)
Potassium: 4.2 mmol/L (ref 3.5–5.1)
Sodium: 140 mmol/L (ref 135–145)
TCO2: 25 mmol/L (ref 22–32)

## 2021-07-20 LAB — COMPREHENSIVE METABOLIC PANEL
ALT: 5 U/L (ref 0–44)
AST: 17 U/L (ref 15–41)
Albumin: 1.6 g/dL — ABNORMAL LOW (ref 3.5–5.0)
Alkaline Phosphatase: 141 U/L — ABNORMAL HIGH (ref 38–126)
Anion gap: 9 (ref 5–15)
BUN: 26 mg/dL — ABNORMAL HIGH (ref 8–23)
CO2: 23 mmol/L (ref 22–32)
Calcium: 8.7 mg/dL — ABNORMAL LOW (ref 8.9–10.3)
Chloride: 105 mmol/L (ref 98–111)
Creatinine, Ser: 0.77 mg/dL (ref 0.44–1.00)
GFR, Estimated: >60 mL/min (ref >60)
Glucose, Bld: 79 mg/dL (ref 70–99)
Potassium: 4.1 mmol/L (ref 3.5–5.1)
Sodium: 137 mmol/L (ref 135–145)
Total Bilirubin: 0.8 mg/dL (ref 0.3–1.2)
Total Protein: 7.4 g/dL (ref 6.5–8.1)

## 2021-07-20 LAB — RESP PANEL BY RT-PCR (FLU A&B, COVID) ARPGX2
Influenza A by PCR: NEGATIVE
Influenza B by PCR: NEGATIVE
SARS Coronavirus 2 by RT PCR: NEGATIVE

## 2021-07-20 LAB — BRAIN NATRIURETIC PEPTIDE: B Natriuretic Peptide: 169.1 pg/mL — ABNORMAL HIGH (ref 0.0–100.0)

## 2021-07-20 LAB — URINALYSIS, MICROSCOPIC (REFLEX)
RBC / HPF: >50 RBC/hpf (ref 0–5)
Squamous Epithelial / HPF: NONE SEEN (ref 0–5)

## 2021-07-20 LAB — LACTIC ACID, PLASMA: Lactic Acid, Venous: 1.3 mmol/L (ref 0.5–1.9)

## 2021-07-20 IMAGING — DX DG CHEST 1V PORT
1 series · 1 of 1 positions shown · non-contrast
Comparison: [DATE]

CLINICAL DATA: Possible sepsis.

EXAM:
PORTABLE CHEST 1 VIEW

[chest ap]
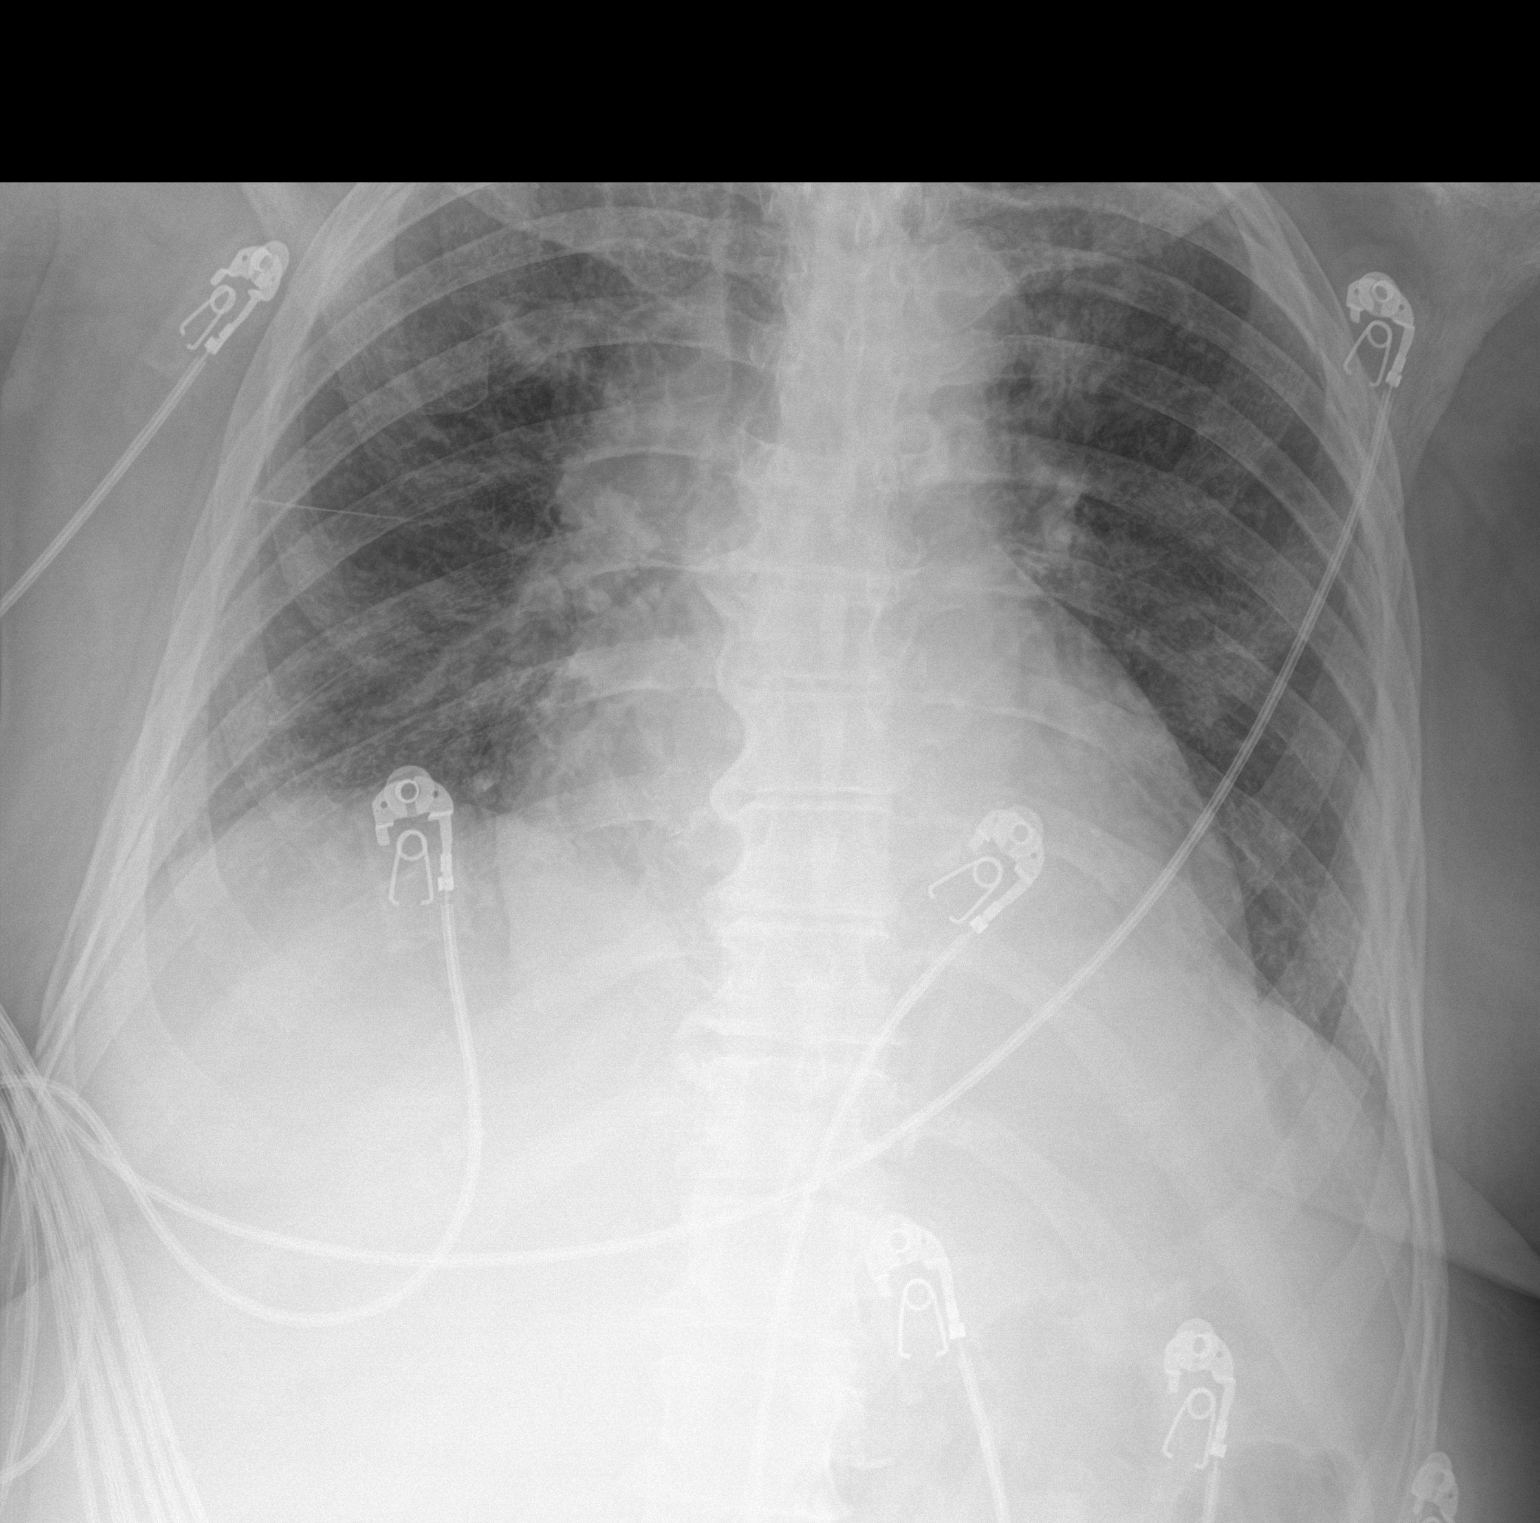

[1 of 1 positions shown; findings below may reference images not displayed]

FINDINGS: Cardiomediastinal silhouette is normal. Mediastinal contours appear
intact.

There is no evidence of focal airspace consolidation, pleural
effusion or pneumothorax. Mild diffuse prominence of the
interstitial markings.

Osseous structures are without acute abnormality. Soft tissues are
grossly normal.
IMPRESSION: Mild diffuse prominence of the interstitial markings, which may
represent pulmonary vascular congestion or early atypical pneumonia.

## 2021-07-20 IMAGING — CT CT HEAD W/O CM
2 series · 15 of 30 positions shown, 17 images · non-contrast
Comparison: MRI [DATE], CT brain [DATE]

CLINICAL DATA: Mental status change

EXAM:
CT HEAD WITHOUT CONTRAST
TECHNIQUE: Contiguous axial images were obtained from the base of the skull
through the vertex without intravenous contrast.

[Series 2: head wo · axial · 0.41mm/px · z∈[-158,-18]mm · 7 of 38 slices shown, 9 images]
[im 5/38  brain]
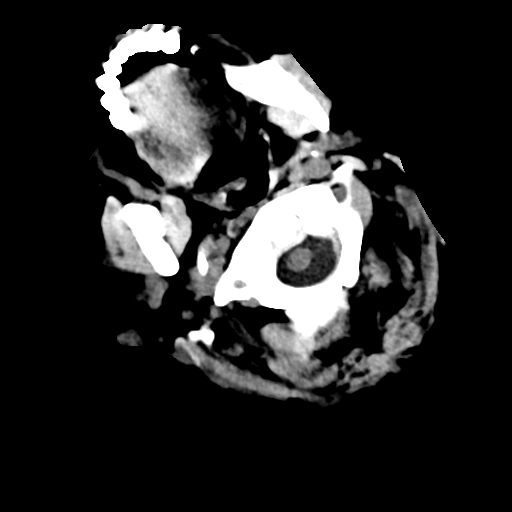
[im 5/38  bone]
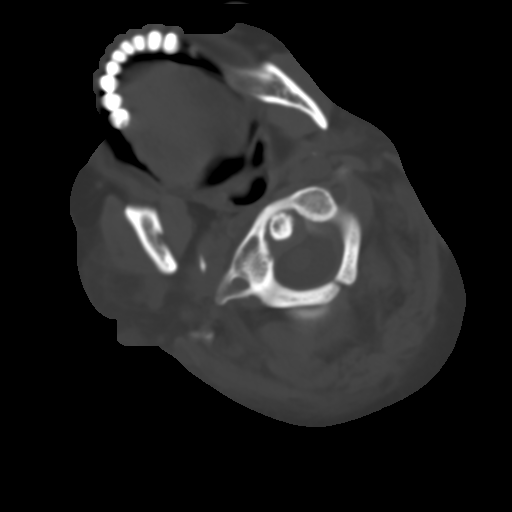
[im 10/38  brain]
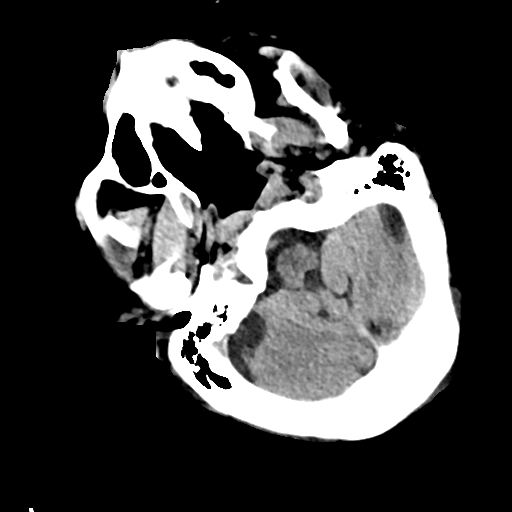
[im 14/38  brain]
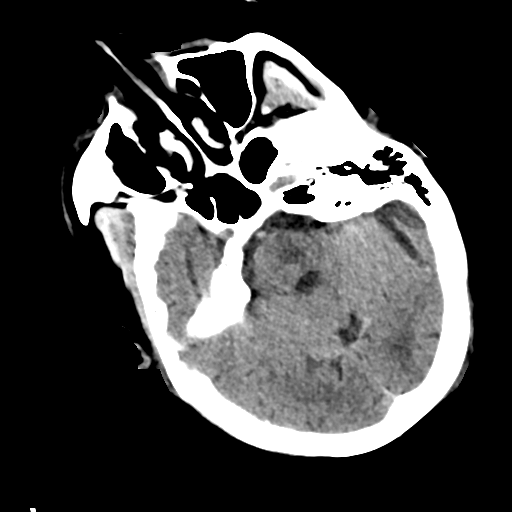
[im 19/38  brain]
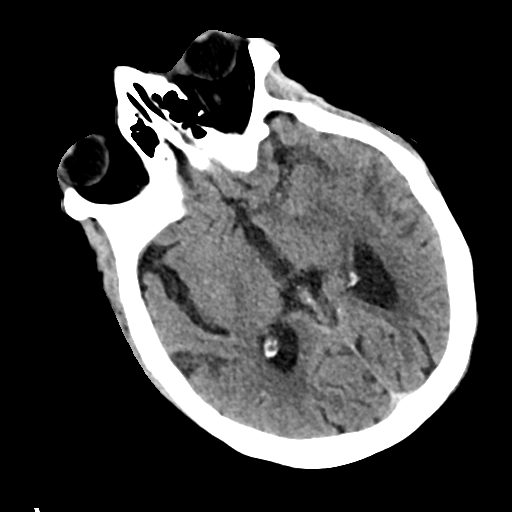
[im 24/38  brain]
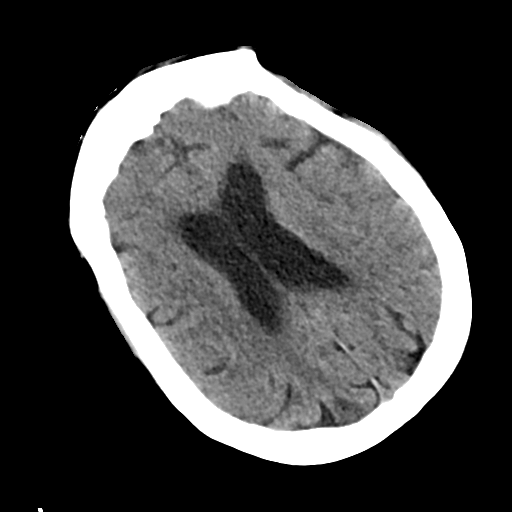
[im 24/38  bone]
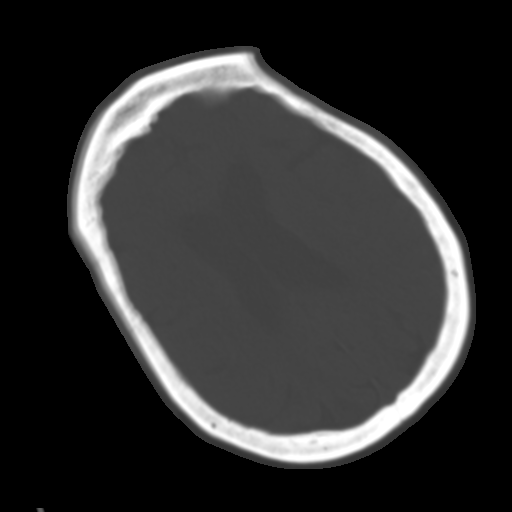
[im 28/38  brain]
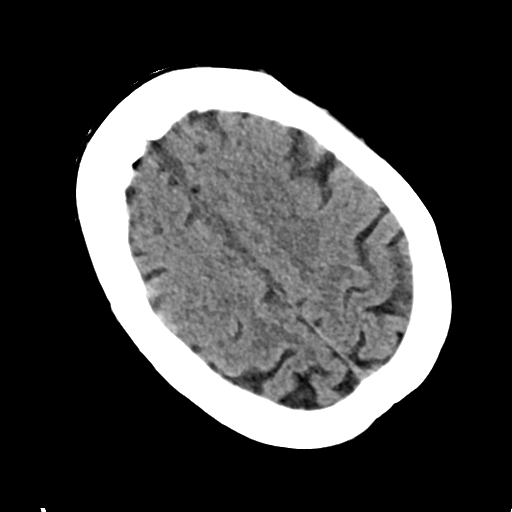
[im 33/38  brain]
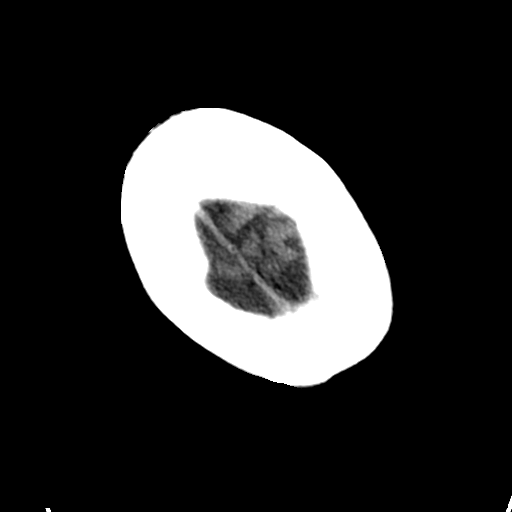

[Series 3: head bone · axial · 0.41mm/px · z∈[-160,-10]mm · 8 of 95 slices shown]
[im 10/95  bone]
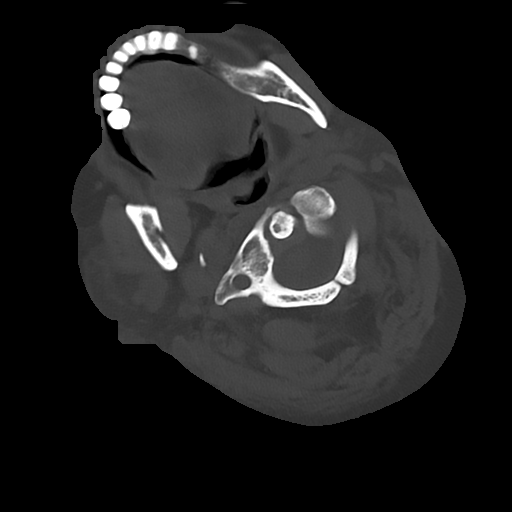
[im 19/95  bone]
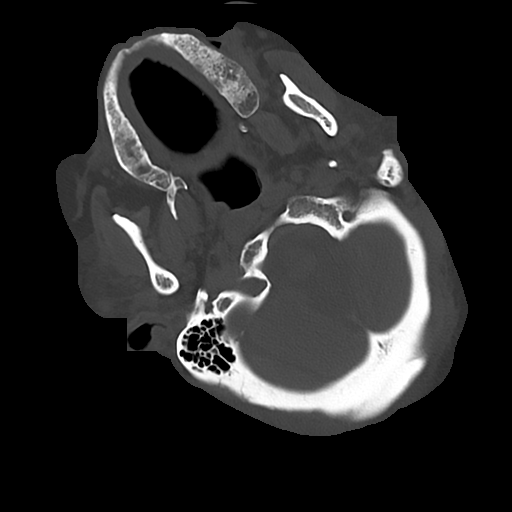
[im 29/95  bone]
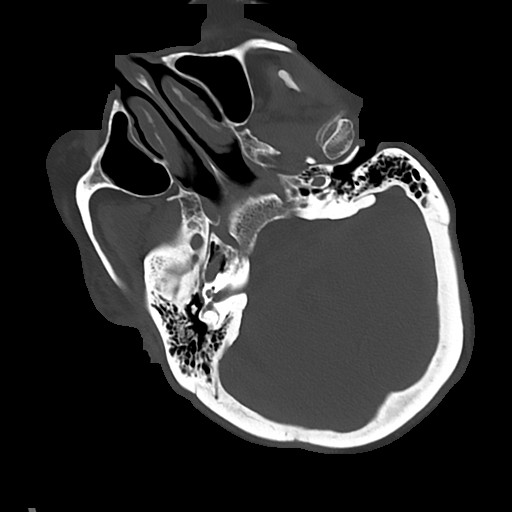
[im 43/95  bone]
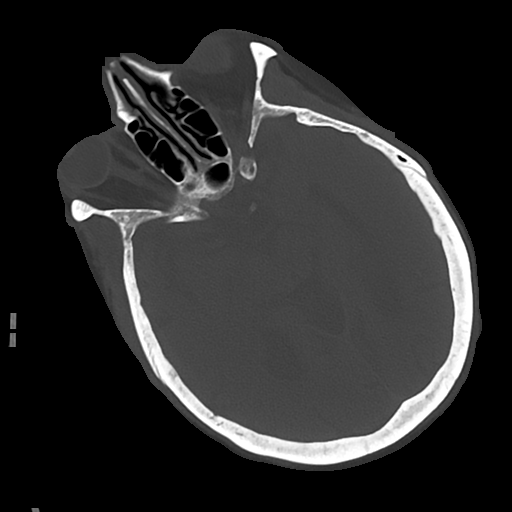
[im 52/95  bone]
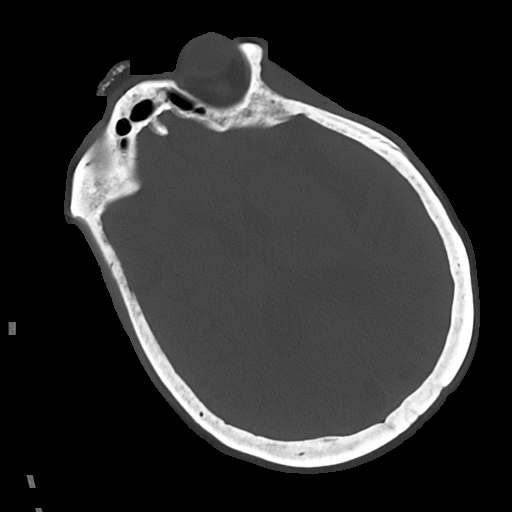
[im 66/95  bone]
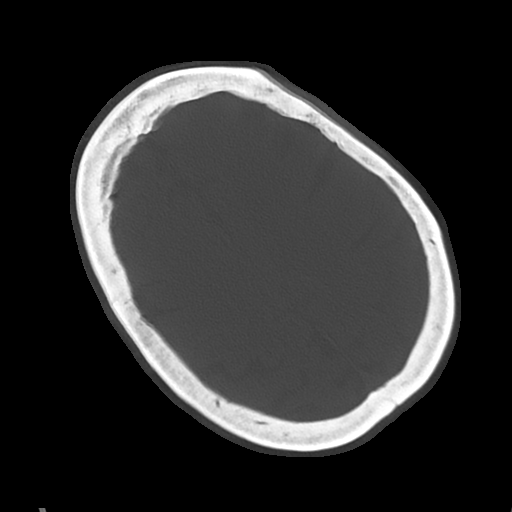
[im 76/95  bone]
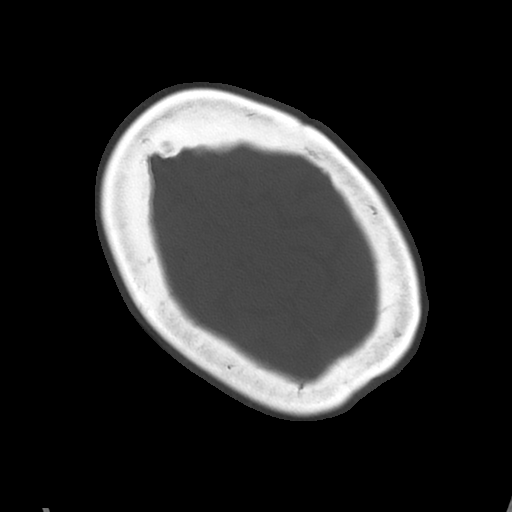
[im 85/95  bone]
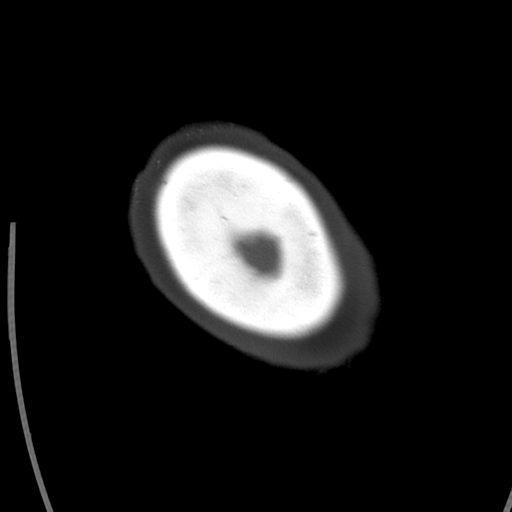

[15 of 30 positions shown; findings below may reference images not displayed]

FINDINGS: Brain: No hemorrhage or intracranial mass is visualized. New focal
hypodensity within the pons, series 2, image 14, sagittal series 5,
image 33. Mild motion degradation. Atrophy and mild chronic small
vessel ischemic changes of the white matter. Stable ventricle size.

Vascular: No hyperdense vessels.  No unexpected calcification

Skull: Normal. Negative for fracture or focal lesion.

Sinuses/Orbits: No acute finding.

Other: Rotated appearance of C1 on C2 likely due to head position.
Incomplete fusion posterior arch of C1
IMPRESSION: 1. Findings are suspicious for acute to subacute pontine infarct,
this is new compared to the previous exams from [facility]. No
hemorrhage identified
2. Atrophy and chronic small vessel ischemic changes of the white
matter

## 2021-07-20 IMAGING — CT CT CHEST W/ CM
2 of 4 series · 14 of 46 positions shown, 16 images · IV contrast (omnipaque)
Comparison: CT of the abdomen [DATE]

CLINICAL DATA: Abdominal pain and fever.

EXAM:
CT CHEST, ABDOMEN, AND PELVIS WITH CONTRAST
TECHNIQUE: Multidetector CT imaging of the chest, abdomen and pelvis was
performed following the standard protocol during bolus
administration of intravenous contrast.
CONTRAST:  75mL OMNIPAQUE IOHEXOL 350 MG/ML SOLN

[Series 3: cap with · axial · 0.82mm/px · z∈[-770,-225]mm · 11 of 129 slices shown, 13 images]
[im 10/129  soft-tissue]
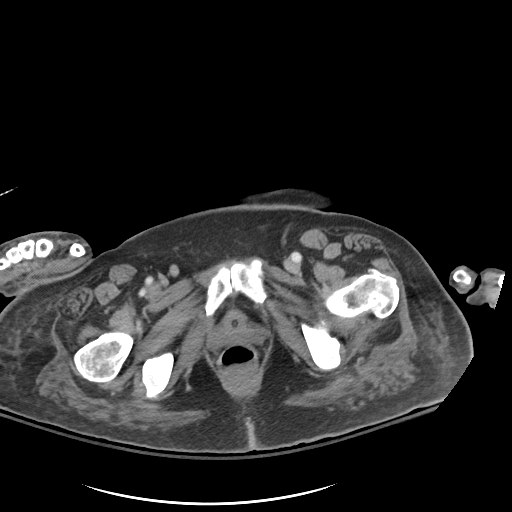
[im 10/129  bone]
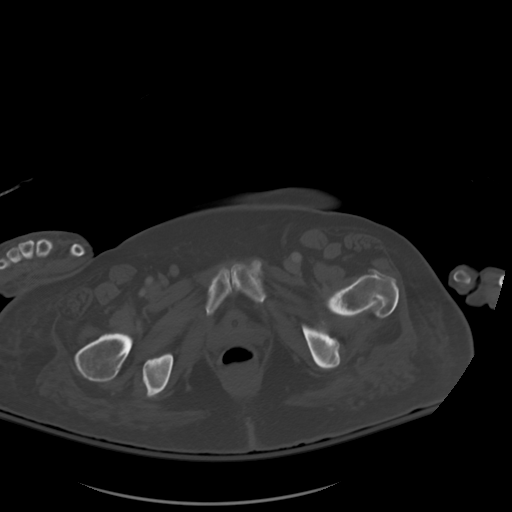
[im 20/129  soft-tissue]
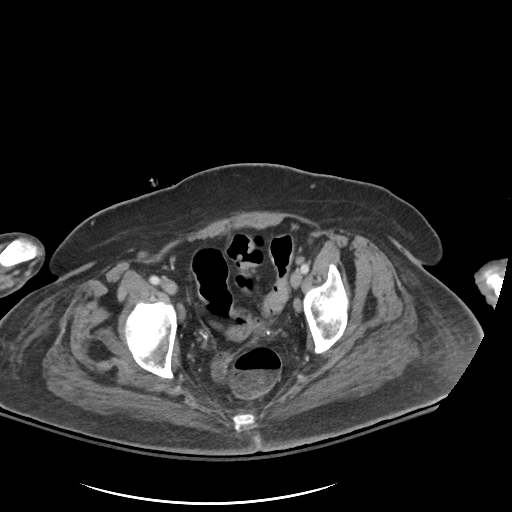
[im 30/129  soft-tissue]
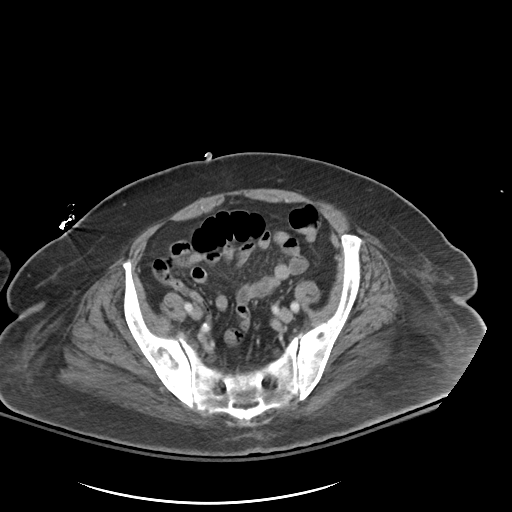
[im 40/129  soft-tissue]
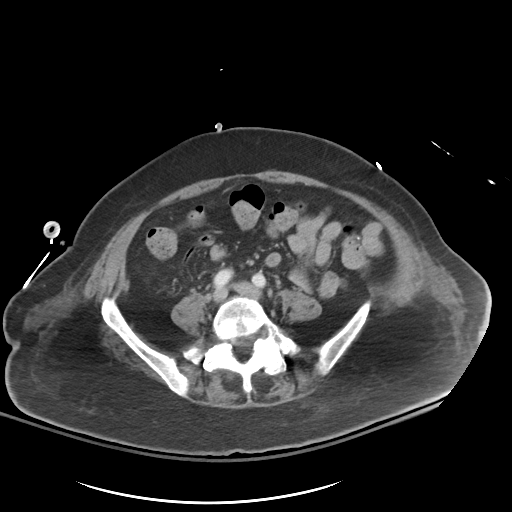
[im 50/129  soft-tissue]
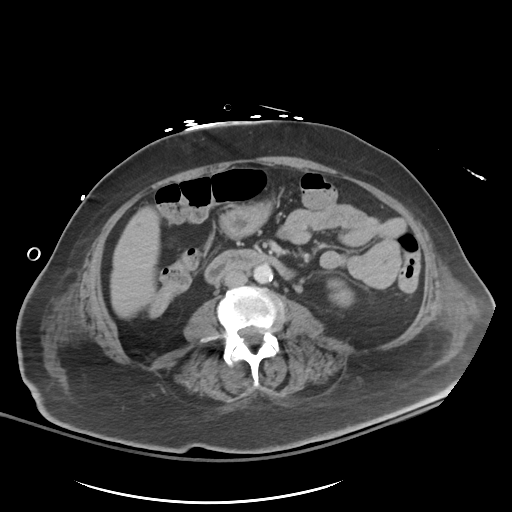
[im 69/129  soft-tissue]
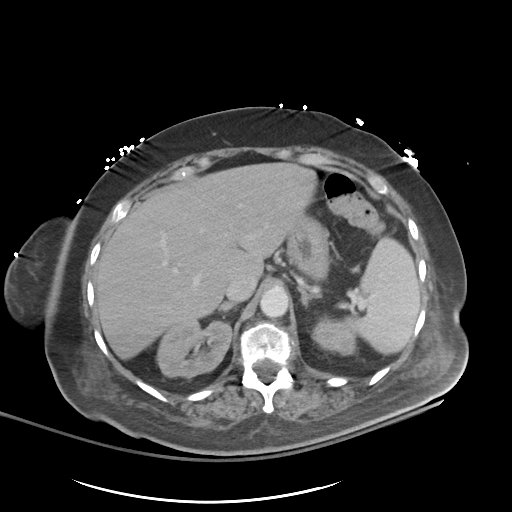
[im 79/129  soft-tissue]
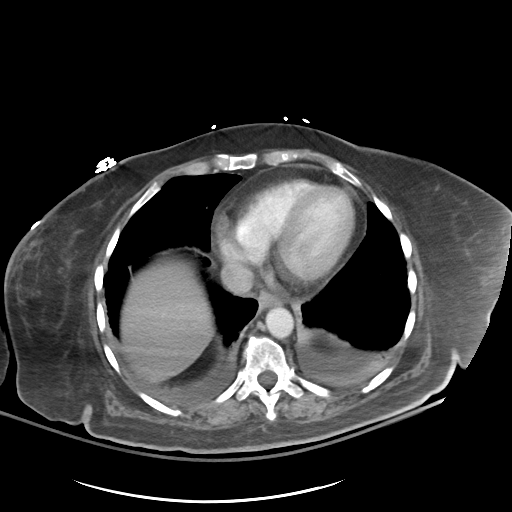
[im 89/129  soft-tissue]
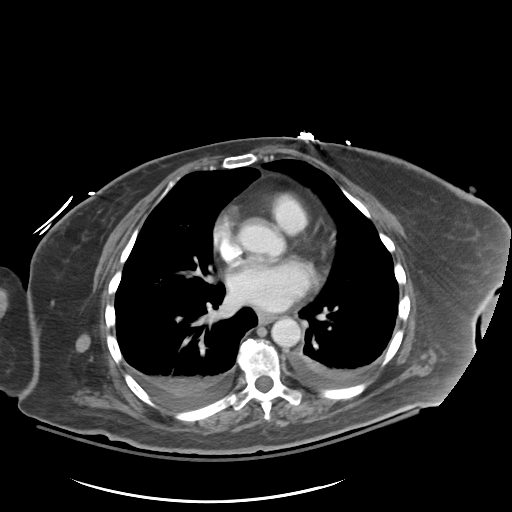
[im 99/129  soft-tissue]
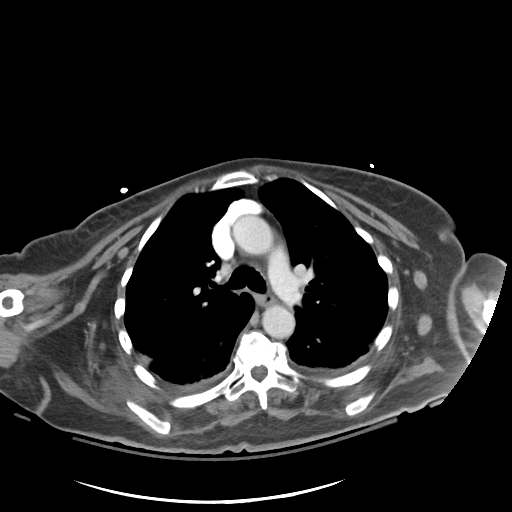
[im 99/129  bone]
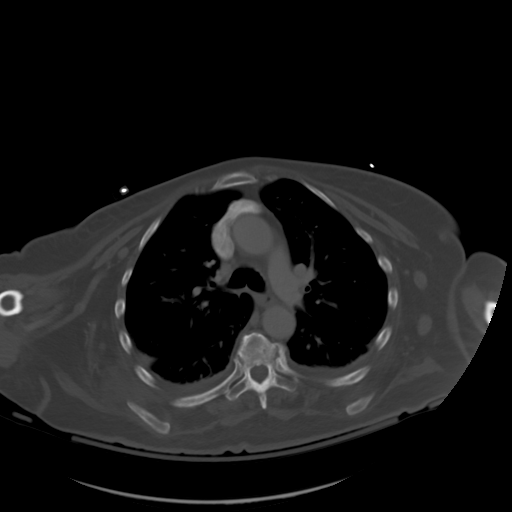
[im 109/129  soft-tissue]
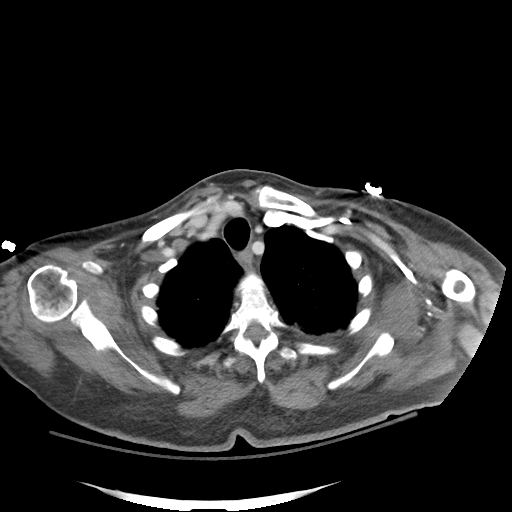
[im 119/129  soft-tissue]
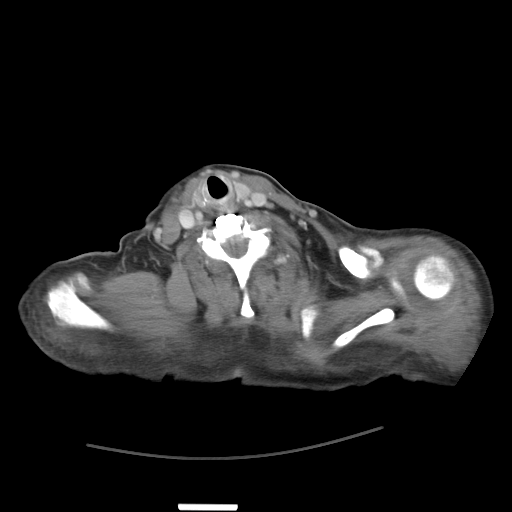

[Series 6: cor · coronal · 0.82mm/px · 3 of 99 slices shown]
[im 33/99  soft-tissue]
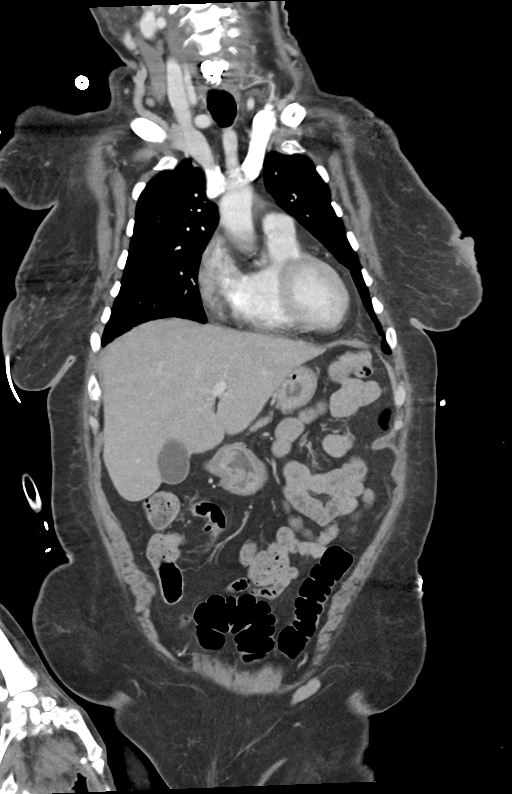
[im 44/99  soft-tissue]
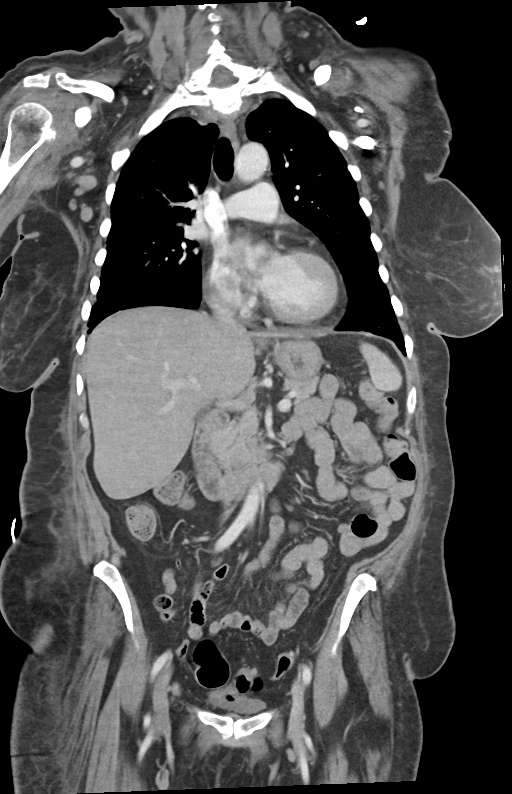
[im 55/99  soft-tissue]
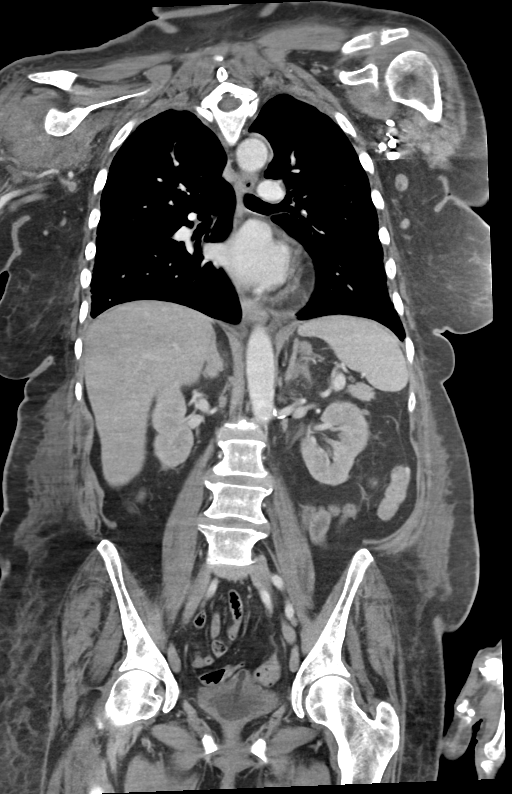

[14 of 46 positions shown; findings below may reference images not displayed]

FINDINGS: CT CHEST FINDINGS

Cardiovascular: Normal heart size. No pericardial effusion. No
central pulmonary embolus. No dissection or aneurysmal dilation of
the aorta.

Mediastinum/Nodes: No enlarged mediastinal, hilar, or axillary lymph
nodes. Thyroid gland, trachea, and esophagus demonstrate no
significant findings.

Lungs/Pleura: Small bilateral pleural effusions. Small amount of
airspace consolidation versus atelectasis in the bilateral lung
bases.

Musculoskeletal: No chest wall mass or suspicious bone lesions
identified.

CT ABDOMEN PELVIS FINDINGS

Hepatobiliary: No focal liver abnormality is seen. No gallstones,
gallbladder wall thickening, or biliary dilatation.

Pancreas: Unremarkable. No pancreatic ductal dilatation or
surrounding inflammatory changes.

Spleen: Normal in size without focal abnormality.

Adrenals/Urinary Tract: Normal adrenal glands. Bilateral too small
to be actually characterized hypoattenuated masses. Stable cystic
appearing lesion in the lower pole of the left kidney.

Stomach/Bowel: Gastrostomy tube is appropriately positioned within
the stomach without significant surrounding inflammatory changes. No
evidence of small-bowel obstruction. Normal caliber of the colon.

Vascular/Lymphatic: Aortic atherosclerosis. No enlarged abdominal or
pelvic lymph nodes.

Reproductive: Status post hysterectomy. No adnexal masses.

Other: No abdominal wall hernia or abnormality. No abdominopelvic
ascites.

Musculoskeletal: There has been an interval development of
dystrophic changes involving the inferior endplate of T12 vertebral
body and superior endplate of L1 vertebral body. There is an
associated narrowing of the intervening intervertebral disc and
small amount of soft tissue thickening anteriorly. This finding is
new when compared to patient's prior CT dated [DATE]
IMPRESSION: 1. Small bilateral pleural effusions.
2. Small amount of airspace consolidation versus atelectasis in the
bilateral lung bases.
3. Interval development of dystrophic changes involving the inferior
endplate of T12 vertebral body and superior endplate of L1 vertebral
body. There is an associated narrowing of the intervening
intervertebral disc and small amount of soft tissue thickening
anteriorly. This finding is new when compared to patient's prior CT
dated [DATE], and therefore may represent sequela of
discitis/osteomyelitis. Differential diagnosis includes asymmetric
discogenic osteoarthritic endplate changes. Please correlate to
patient's symptoms. If further imaging evaluation is desired,
contrast enhanced MRI of the lumbar spine may be considered.
4. Gastrostomy tube appropriately positioned within the stomach
without significant surrounding inflammatory changes.
5. Bilateral too small to be actually characterized hypoattenuated
masses in both kidneys.

Aortic Atherosclerosis ([4W]-[4W]).

## 2021-07-20 MED ORDER — VANCOMYCIN HCL 1750 MG/350ML IV SOLN
1750.0000 mg | Freq: Once | INTRAVENOUS | Status: AC
  Administered 2021-07-20: 1750 mg via INTRAVENOUS
  Filled 2021-07-20: qty 350

## 2021-07-20 MED ORDER — VANCOMYCIN HCL IN DEXTROSE 1-5 GM/200ML-% IV SOLN
1000.0000 mg | Freq: Two times a day (BID) | INTRAVENOUS | Status: DC
  Administered 2021-07-21: 1000 mg via INTRAVENOUS
  Filled 2021-07-20 (×2): qty 200

## 2021-07-20 MED ORDER — SODIUM CHLORIDE 0.9 % IV SOLN
2.0000 g | Freq: Three times a day (TID) | INTRAVENOUS | Status: DC
  Administered 2021-07-20 – 2021-07-21 (×2): 2 g via INTRAVENOUS
  Filled 2021-07-20 (×2): qty 2

## 2021-07-20 MED ORDER — IOHEXOL 350 MG/ML SOLN
75.0000 mL | Freq: Once | INTRAVENOUS | Status: AC | PRN
  Administered 2021-07-20: 75 mL via INTRAVENOUS

## 2021-07-20 MED ORDER — LACTATED RINGERS IV BOLUS
1000.0000 mL | Freq: Once | INTRAVENOUS | Status: AC
  Administered 2021-07-20: 1000 mL via INTRAVENOUS

## 2021-07-20 MED ORDER — DIPHENHYDRAMINE HCL 12.5 MG/5ML PO ELIX
25.0000 mg | ORAL_SOLUTION | Freq: Once | ORAL | Status: AC
  Administered 2021-07-20: 25 mg
  Filled 2021-07-20: qty 10

## 2021-07-20 NOTE — Telephone Encounter (Signed)
Nurse called from [facility] to report that this resident's family has requested that they go the ER due to confusion. The nurse checked her vitals and they were normal and she has not noted a change in cognition but notes indicated that she is chronically confused. Her POA has requested that she go to the [facility].

## 2021-07-20 NOTE — ED Provider Notes (Signed)
[facility] EMERGENCY DEPARTMENT Provider Note   CSN: [id number] Arrival date & time: 07/20/21  1627  LEVEL 5 CAVEAT - DEMENTIA   History No chief complaint on file.   Kristy [name] is a 75 y.o. female.  HPI 75 year old female brought in by EMS at family request.  The patient reportedly was seen by the daughter today who felt she was altered and possibly septic and wanted her sent to the hospital but not [facility].  I talked to the nurse who is taking care of her today and while this is the nurses first day she was talking to her colleagues and it seems like the patient is currently at her baseline.  She has been altered since arriving at the beginning of the month and has had abnormal skin that she discussed as "sloughing".  The patient seems to be acting at the baseline with no fevers.  History reviewed. No pertinent past medical history.  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     No family history on file.  Social History   Tobacco Use   Smoking status: Unknown    Home Medications Prior to Admission medications   Medication Sig Start Date End Date Taking? Authorizing Provider  acetaminophen (TYLENOL) 160 MG/5ML solution Place 20.3 mLs (650 mg total) into feeding tube every 6 (six) hours. 07/10/21  Yes Russella Dar, NP  allopurinol (ZYLOPRIM) 100 MG tablet Take 100 mg by mouth daily.   Yes [provider]  bisacodyl (DULCOLAX) 10 MG suppository Place 10 mg rectally as needed for moderate constipation.   Yes [provider]  ceFAZolin (ANCEF) 2-4 GM/100ML-% IVPB Inject 100 mLs (2 g total) into the vein every 8 (eight) hours. 07/10/21  Yes Russella Dar, NP  diclofenac Sodium (VOLTAREN) 1 % GEL Apply 4 g topically 4 (four) times daily. To neck 07/10/21  Yes Russella Dar, NP  folic acid (FOLVITE) 1 MG tablet Place 1 tablet (1 mg total) into feeding tube daily. 07/11/21  Yes Russella Dar, NP  hydrOXYzine (ATARAX/VISTARIL) 25 MG tablet Take 25 mg by mouth daily as needed for itching.   Yes [provider]  Magnesium Hydroxide (MILK OF MAGNESIA PO) Take 30 mLs by mouth daily as needed (constipation).   Yes [provider]  melatonin 3 MG TABS tablet Place 1  tablet (3 mg total) into feeding tube at bedtime as needed. Patient taking differently: Place 3 mg into feeding tube at bedtime as needed (sleep). 07/10/21  Yes Russella Dar, NP  midodrine (PROAMATINE) 5 MG tablet Place 1 tablet (5 mg total) into feeding tube 3 (three) times daily with meals. 07/10/21  Yes Russella Dar, NP  Multiple Vitamin (MULTIVITAMIN) LIQD Place 15 mLs into feeding tube daily. 07/11/21  Yes Russella Dar, NP  niacin 500 MG tablet Place 1  tablet (500 mg total) into feeding tube at bedtime. 07/10/21  Yes Russella Dar, NP  Nutritional Supplements (FEEDING SUPPLEMENT, OSMOLITE 1.2 CAL,) LIQD Place 1,000 mLs into feeding tube continuous. Patient taking differently: Place 1,000 mLs into feeding tube continuous. 65 ml/hr Continuously with 125 ml water flushed every 4 hours via g-tube 07/10/21  Yes Russella Dar, NP  Nutritional Supplements (FEEDING SUPPLEMENT, PROSOURCE TF,) liquid Place 45 mLs into feeding tube 2 (two) times daily. 07/11/21  Yes Russella Dar, NP  oxyCODONE (OXY IR/ROXICODONE) 5 MG immediate release tablet Take 1 tablet (5 mg total) by mouth every 6 (six) hours as needed for breakthrough pain or severe pain. Patient taking differently: Take 5 mg by mouth every 8 (eight) hours as needed for breakthrough pain or severe pain. 07/10/21  Yes Russella Dar, NP  polyethylene glycol (MIRALAX / GLYCOLAX) 17 g packet Place 17 g into feeding tube daily as needed for mild constipation or severe constipation. 07/10/21  Yes Russella Dar, NP  rifampin (RIFADIN) 300 MG capsule Take 1 capsule (300 mg total) by mouth every 12 (twelve) hours. 07/10/21  Yes Russella Dar, NP  Sodium Phosphates (RA SALINE ENEMA RE) Place 1 Bottle rectally as needed (constipation).   Yes [provider]  thiamine 100 MG tablet Place 1 tablet (100 mg total) into feeding tube daily. 07/11/21  Yes Russella Dar, NP  Water For Irrigation, Sterile (FREE WATER) SOLN Place 125 mLs into feeding tube every 4 (four) hours. 07/10/21  Yes Russella Dar, NP    Allergies    Patient has no known allergies.  Review of Systems   Review of Systems  Unable to perform ROS: Dementia   Physical Exam Updated Vital Signs BP (!) 162/82   Pulse (!) 113   Temp 99.6 F (37.6 C) (Rectal)   Resp (!) 23   SpO2 100%   Physical Exam Vitals and nursing note reviewed.  Constitutional:      Appearance: She is well-developed.     Comments: Patient has her  head turned to the right.  She sometimes moans and sometimes says ow but does not really respond verbally.  She moves all extremities minimally to command.  Otherwise she keeps her eyes closed.  HENT:     Head: Normocephalic and atraumatic.     Right Ear: External ear normal.     Left Ear: External ear normal.     Nose: Nose normal.  Eyes:     General:        Right eye: No discharge.        Left eye: No discharge.     Pupils: Pupils are equal, round, and reactive to light.  Cardiovascular:     Rate and Rhythm: Regular rhythm. Tachycardia present.     Heart sounds: Normal heart sounds.  Pulmonary:     Effort: Pulmonary effort is normal.     Breath sounds: Normal breath sounds.  Abdominal:  Palpations: Abdomen is soft.     Tenderness: There is generalized abdominal tenderness.  Skin:    General: Skin is warm and dry.  Neurological:     Mental Status: She is alert.  Psychiatric:        Mood and Affect: Mood is not anxious.    ED Results / Procedures / Treatments   Labs (all labs ordered are listed, but only abnormal results are displayed) Labs Reviewed  CBC WITH DIFFERENTIAL/PLATELET - Abnormal; Notable for the following components:      Result Value   WBC 12.5 (*)    RBC 2.59 (*)    Hemoglobin 8.0 (*)    HCT 26.0 (*)    MCV 100.4 (*)    RDW 16.6 (*)    Neutro Abs 8.8 (*)    Eosinophils Absolute 1.8 (*)    Abs Immature Granulocytes 0.09 (*)    All other components within normal limits  COMPREHENSIVE METABOLIC PANEL - Abnormal; Notable for the following components:   BUN 26 (*)    Calcium 8.7 (*)    Albumin 1.6 (*)    Alkaline Phosphatase 141 (*)    All other components within normal limits  URINALYSIS, ROUTINE W REFLEX MICROSCOPIC - Abnormal; Notable for the following components:   Hgb urine dipstick LARGE (*)    Nitrite POSITIVE (*)    Leukocytes,Ua MODERATE (*)    All other components within normal limits  URINALYSIS, MICROSCOPIC (REFLEX) - Abnormal; Notable  for the following components:   Bacteria, UA MANY (*)    All other components within normal limits  BRAIN NATRIURETIC PEPTIDE - Abnormal; Notable for the following components:   B Natriuretic Peptide 169.1 (*)    All other components within normal limits  I-STAT CHEM 8, ED - Abnormal; Notable for the following components:   BUN 24 (*)    Hemoglobin 8.2 (*)    HCT 24.0 (*)    All other components within normal limits  RESP PANEL BY RT-PCR (FLU A&B, COVID) ARPGX2  CULTURE, BLOOD (ROUTINE X 2)  CULTURE, BLOOD (ROUTINE X 2)  URINE CULTURE  MRSA NEXT GEN BY PCR, NASAL  LACTIC ACID, PLASMA  BASIC METABOLIC PANEL  CBG MONITORING, ED    EKG EKG Interpretation  Date/Time:  Saturday July 20 2021 16:59:29 EDT Ventricular Rate:  108 PR Interval:  130 QRS Duration: 81 QT Interval:  320 QTC Calculation: 429 R Axis:   27 Text Interpretation: Sinus tachycardia Abnormal R-wave progression, early transition Confirmed by Pricilla Loveless [phone]) on 07/20/2021 5:19:03 PM  Radiology DG Chest Portable 1 View  Result Date: 07/20/2021 CLINICAL DATA:  Possible sepsis. EXAM: PORTABLE CHEST 1 VIEW COMPARISON:  June 30, 2021 FINDINGS: Cardiomediastinal silhouette is normal. Mediastinal contours appear intact. There is no evidence of focal airspace consolidation, pleural effusion or pneumothorax. Mild diffuse prominence of the interstitial markings. Osseous structures are without acute abnormality. Soft tissues are grossly normal. IMPRESSION: Mild diffuse prominence of the interstitial markings, which may represent pulmonary vascular congestion or early atypical pneumonia. Electronically Signed   By: Ted Mcalpine M.D.   On: 07/20/2021 17:14    Procedures .Critical Care Performed by: Pricilla Loveless, MD Authorized by: Pricilla Loveless, MD   Critical care provider statement:    Critical care time (minutes):  45   Critical care time was exclusive of:  Separately billable procedures and  treating other patients   Critical care was necessary to treat or prevent imminent or life-threatening deterioration of the following conditions:  Sepsis   Critical care was time spent personally by me on the following activities:  Discussions with consultants, evaluation of patient's response to treatment, examination of patient, ordering and performing treatments and interventions, ordering and review of laboratory studies, ordering and review of radiographic studies, pulse oximetry, re-evaluation of patient's condition, obtaining history from patient or surrogate and review of old charts   Medications Ordered in ED Medications  ceFEPIme (MAXIPIME) 2 g in sodium chloride 0.9 % 100 mL IVPB (0 g Intravenous Stopped 07/20/21 2225)  vancomycin (VANCOREADY) IVPB 1750 mg/350 mL (1,750 mg Intravenous New Bag/Given 07/20/21 2227)  vancomycin (VANCOCIN) IVPB 1000 mg/200 mL premix (has no administration in time range)  lactated ringers bolus 1,000 mL (0 mLs Intravenous Stopped 07/20/21 2144)  diphenhydrAMINE (BENADRYL) 12.5 MG/5ML elixir 25 mg (25 mg Per Tube Given 07/20/21 1828)  iohexol (OMNIPAQUE) 350 MG/ML injection 75 mL (75 mLs Intravenous Contrast Given 07/20/21 1941)    ED Course  I have reviewed the triage vital signs and the nursing notes.  Pertinent labs & imaging results that were available during my care of the patient were reviewed by me and considered in my medical decision making (see chart for details).    MDM Rules/Calculators/A&P                           Patient is very complicated.  Chart review seems indicate that she had decompression of cervical spine epidural abscess and has had issues since.  Here, I have talked to the daughter over the phone and she indicates that the patient was clearly not herself upon discharge and is more altered when she saw her today.  She feels like she is having trouble breathing.  She is requesting that [facility], solely  because she is not getting the explanation she needs from here.  I did discuss with [facility], and discussed with Dr. Skeet SimmerAkinola, but at this point they are waiting list is very long and they cannot accept the patient, especially as there is no medical reason besides family preference.  I discussed multiple times with Dr. Sharl MaLama, who will admit.  I did update the daughter about being unable to transfer her to [facility] tonight.  Otherwise, there is questionable findings on her imaging including potential subacute stroke and potential T12/L1 lesion that could be infectious.  She is currently on antibiotics but I think with the possible pneumonia on the CT it would be reasonable to broaden her out, especially with some leukocytosis.  She was given fluids and antibiotics.  At 1 point, while she is altered, she did indicate that she was itching so she was given some oral Benadryl.  Otherwise she has remained stable with mild tachycardia.  Admit to hospitalist service. Final Clinical Impression(s) / ED Diagnoses Final diagnoses:  HCAP (healthcare-associated pneumonia)    Rx / DC Orders ED Discharge Orders     None        Pricilla LovelessGoldston, Youlanda Tomassetti, MD 07/20/21 2307

## 2021-07-20 NOTE — ED Triage Notes (Signed)
Pt here from [facility] sent out due to family request for possible sepsis , pt has a stage 2 wound to her sacrum and will respond to questions  has a peg tube foley and a midline Iv to the left upper arm on arrival

## 2021-07-20 NOTE — ED Notes (Signed)
Patient transported to CT 

## 2021-07-20 NOTE — Progress Notes (Signed)
Pharmacy Antibiotic Note  Kristy [name] is a 75 y.o. female admitted on 07/20/2021 with sepsis and confusion.  Pharmacy has been consulted for vancomycin and cefepime dosing.  Patient with tmax of 99.6, wbc 12.5, and normal renal function.   Plan: Cefepime 2g q8 hours Vancomycin 1000 mg IV Q 12 hrs. Goal AUC 400-550. Expected AUC: 491 SCr used: 0.6     Temp (24hrs), Avg:99.6 F (37.6 C), Min:99.6 F (37.6 C), Max:99.6 F (37.6 C)  Recent Labs  Lab 07/20/21 1754 07/20/21 1820  WBC 12.5*  --   CREATININE 0.77 0.60  LATICACIDVEN 1.3  --     Estimated Creatinine Clearance: 66.4 mL/min (by C-G formula based on SCr of 0.6 mg/dL).    No Known Allergies  Thank you for allowing pharmacy to be a part of this patient's care.  Sheppard Coil PharmD., BCPS Clinical Pharmacist 07/20/2021 9:53 PM

## 2021-07-20 NOTE — ED Notes (Signed)
Pt returned from CT scan.

## 2021-07-20 NOTE — ED Notes (Signed)
Pt to CT scan.

## 2021-07-21 ENCOUNTER — Inpatient Hospital Stay (HOSPITAL_COMMUNITY): Payer: Medicare Other

## 2021-07-21 DIAGNOSIS — I6389 Other cerebral infarction: Secondary | ICD-10-CM

## 2021-07-21 DIAGNOSIS — I639 Cerebral infarction, unspecified: Secondary | ICD-10-CM

## 2021-07-21 DIAGNOSIS — I609 Nontraumatic subarachnoid hemorrhage, unspecified: Secondary | ICD-10-CM

## 2021-07-21 DIAGNOSIS — I6330 Cerebral infarction due to thrombosis of unspecified cerebral artery: Secondary | ICD-10-CM | POA: Insufficient documentation

## 2021-07-21 DIAGNOSIS — I619 Nontraumatic intracerebral hemorrhage, unspecified: Secondary | ICD-10-CM | POA: Insufficient documentation

## 2021-07-21 DIAGNOSIS — J189 Pneumonia, unspecified organism: Secondary | ICD-10-CM

## 2021-07-21 DIAGNOSIS — I6340 Cerebral infarction due to embolism of unspecified cerebral artery: Secondary | ICD-10-CM | POA: Insufficient documentation

## 2021-07-21 LAB — MRSA NEXT GEN BY PCR, NASAL: MRSA by PCR Next Gen: NOT DETECTED

## 2021-07-21 LAB — ECHOCARDIOGRAM COMPLETE
AR max vel: 2.6 cm2
AV Area VTI: 2.25 cm2
AV Area mean vel: 2.45 cm2
AV Mean grad: 5 mmHg
AV Peak grad: 10.8 mmHg
Ao pk vel: 1.64 m/s
MV M vel: 6.39 m/s
MV Peak grad: 163.3 mmHg
S' Lateral: 3.2 cm
Single Plane A4C EF: 60.9 %

## 2021-07-21 LAB — LIPID PANEL
Cholesterol: 137 mg/dL (ref 0–200)
HDL: 29 mg/dL — ABNORMAL LOW (ref >40)
LDL Cholesterol: 88 mg/dL (ref 0–99)
Total CHOL/HDL Ratio: 4.7 RATIO
Triglycerides: 100 mg/dL (ref <150)
VLDL: 20 mg/dL (ref 0–40)

## 2021-07-21 LAB — CBC
HCT: 25.2 % — ABNORMAL LOW (ref 36.0–46.0)
Hemoglobin: 7.9 g/dL — ABNORMAL LOW (ref 12.0–15.0)
MCH: 31.2 pg (ref 26.0–34.0)
MCHC: 31.3 g/dL (ref 30.0–36.0)
MCV: 99.6 fL (ref 80.0–100.0)
Platelets: 204 10*3/uL (ref 150–400)
RBC: 2.53 MIL/uL — ABNORMAL LOW (ref 3.87–5.11)
RDW: 16.6 % — ABNORMAL HIGH (ref 11.5–15.5)
WBC: 12.6 10*3/uL — ABNORMAL HIGH (ref 4.0–10.5)
nRBC: 0 % (ref 0.0–0.2)

## 2021-07-21 LAB — GLUCOSE, CAPILLARY
Glucose-Capillary: 69 mg/dL — ABNORMAL LOW (ref 70–99)
Glucose-Capillary: 77 mg/dL (ref 70–99)
Glucose-Capillary: 90 mg/dL (ref 70–99)
Glucose-Capillary: 68 mg/dL — ABNORMAL LOW (ref 70–99)

## 2021-07-21 LAB — BASIC METABOLIC PANEL
Anion gap: 10 (ref 5–15)
BUN: 21 mg/dL (ref 8–23)
CO2: 21 mmol/L — ABNORMAL LOW (ref 22–32)
Calcium: 8.6 mg/dL — ABNORMAL LOW (ref 8.9–10.3)
Chloride: 104 mmol/L (ref 98–111)
Creatinine, Ser: 0.78 mg/dL (ref 0.44–1.00)
GFR, Estimated: >60 mL/min (ref >60)
Glucose, Bld: 86 mg/dL (ref 70–99)
Potassium: 3.8 mmol/L (ref 3.5–5.1)
Sodium: 135 mmol/L (ref 135–145)

## 2021-07-21 LAB — CBG MONITORING, ED
Glucose-Capillary: 91 mg/dL (ref 70–99)
Glucose-Capillary: 86 mg/dL (ref 70–99)

## 2021-07-21 LAB — HEMOGLOBIN A1C
Hgb A1c MFr Bld: 5.1 % (ref 4.8–5.6)
Mean Plasma Glucose: 99.67 mg/dL

## 2021-07-21 IMAGING — MR MR HEAD W/O CM
6 of 10 series · 29 of 48 positions shown · non-contrast
Comparison: CT from [DATE].

CLINICAL DATA: Initial evaluation for mental status change.

EXAM:
MRI HEAD WITHOUT CONTRAST
TECHNIQUE: Multiplanar, multiecho pulse sequences of the brain and surrounding
structures were obtained without intravenous contrast.

[Series 2: FLAIR · sagittal · 5.0mm · 0.23mm/px · 2 of 23 slices shown (1 of 2)]
[im 1/23]
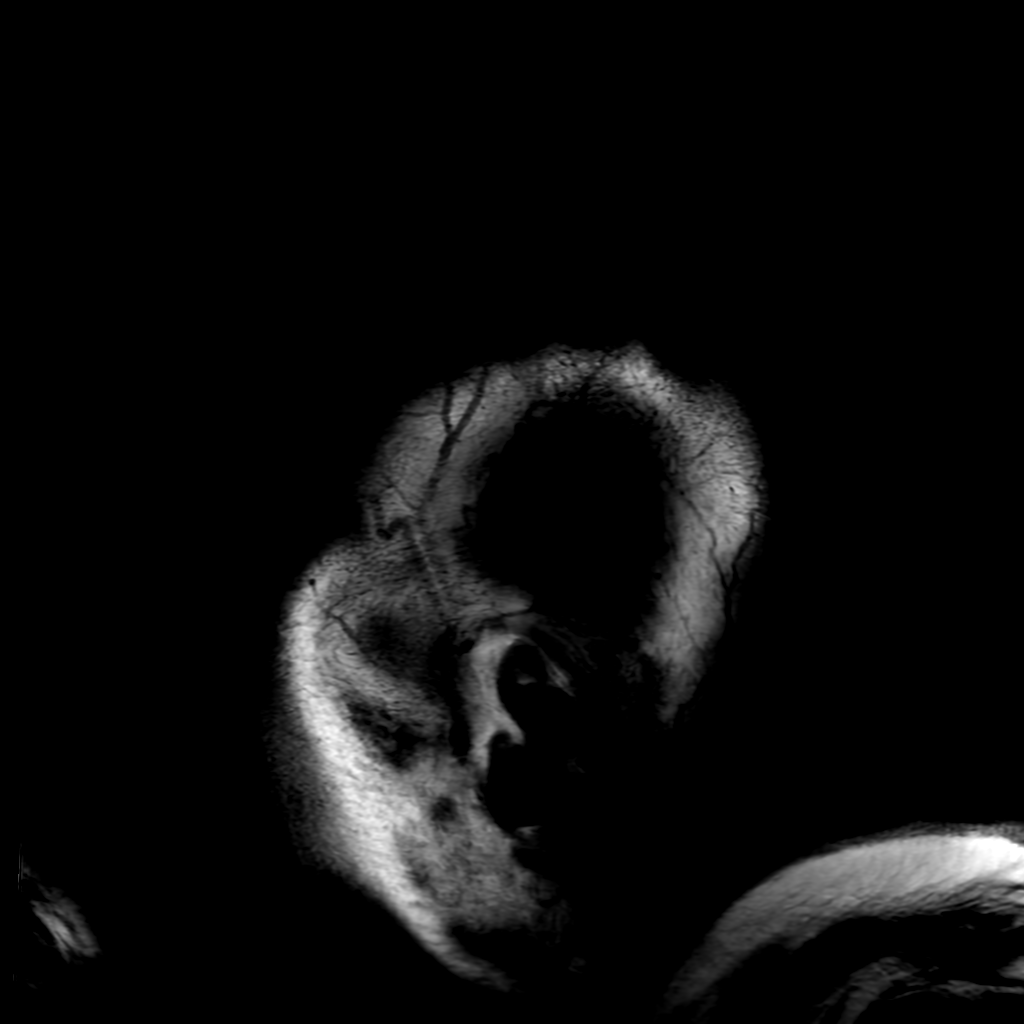
[im 23/23]
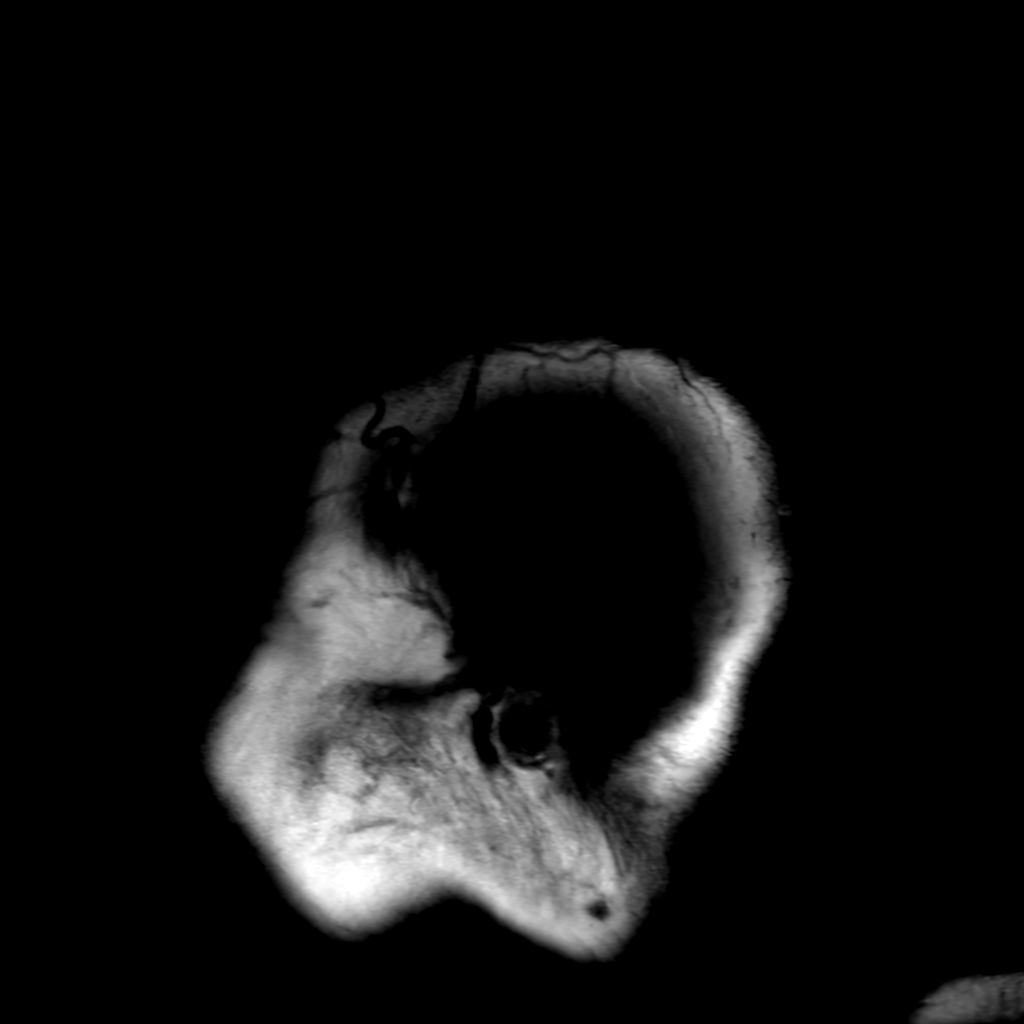

[Series 3: DWI · axial · 3.0mm · 0.94mm/px · z∈[-75,+66]mm · 9 of 99 slices shown (1 of 2)]
[im 1/99]
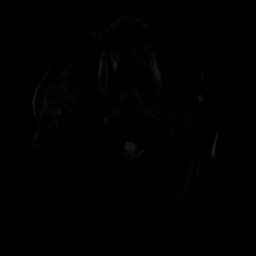
[im 13/99]
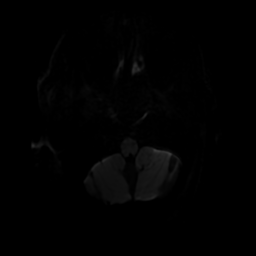
[im 25/99]
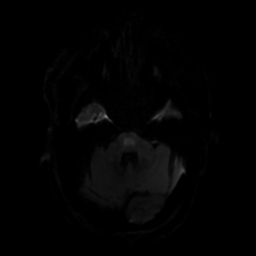
[im 37/99]
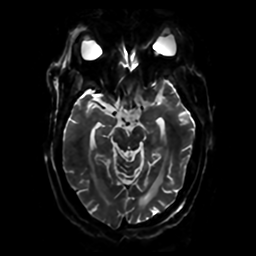
[im 50/99]
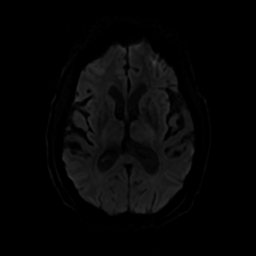
[im 62/99]
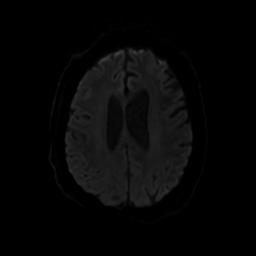
[im 74/99]
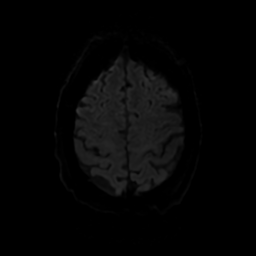
[im 86/99]
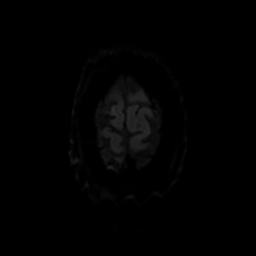
[im 99/99]
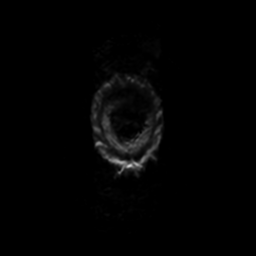

[Series 4: DWI · coronal · 4.0mm · 0.94mm/px · 7 of 74 slices shown (2 of 2)]
[im 1/74]
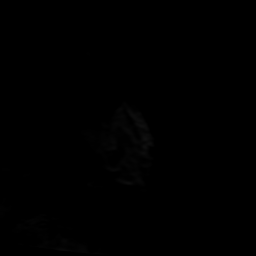
[im 13/74]
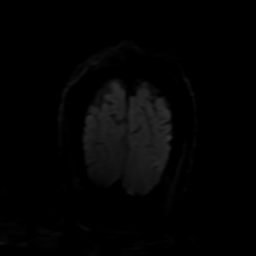
[im 25/74]
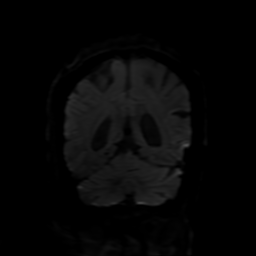
[im 37/74]
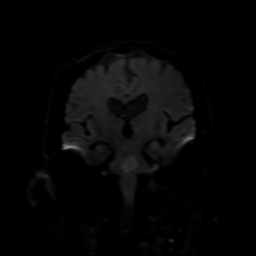
[im 49/74]
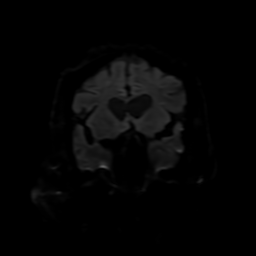
[im 61/74]
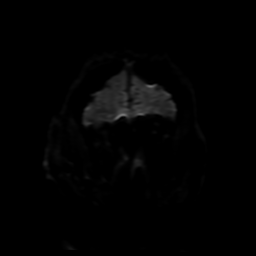
[im 74/74]
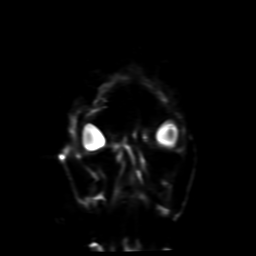

[Series 7: FLAIR · axial · 5.0mm · 0.47mm/px · z∈[-58,+89]mm · 3 of 27 slices shown (2 of 2)]
[im 1/27]
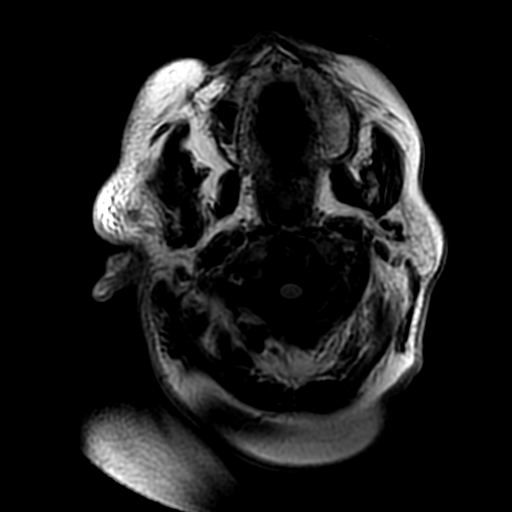
[im 14/27]
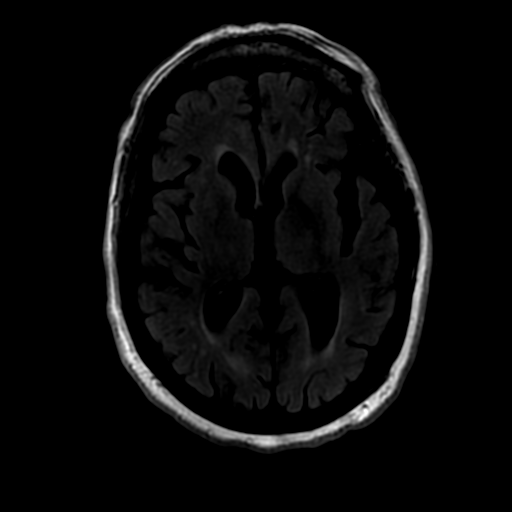
[im 27/27]
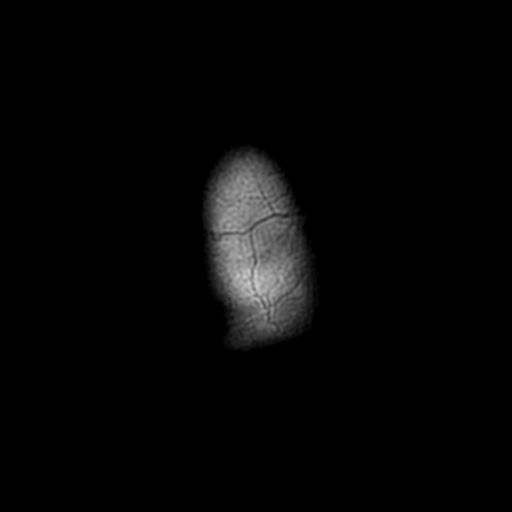

[Series 350: ADC · axial · 3.0mm · 0.94mm/px · z∈[-75,+66]mm · 5 of 49 slices shown (1 of 2)]
[im 1/49]
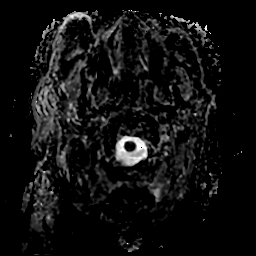
[im 13/49]
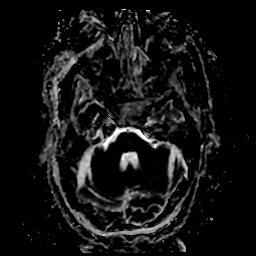
[im 25/49]
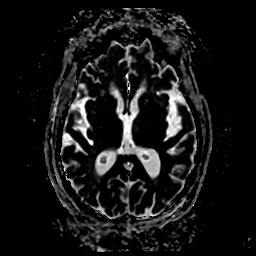
[im 37/49]
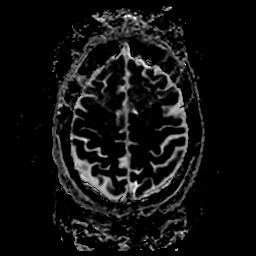
[im 49/49]
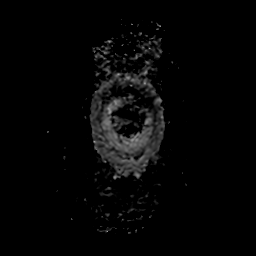

[Series 450: ADC · coronal · 4.0mm · 0.94mm/px · 3 of 35 slices shown (2 of 2)]
[im 1/35]
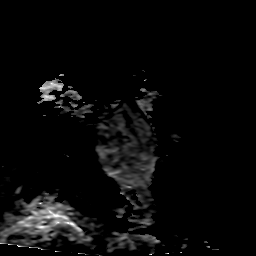
[im 18/35]
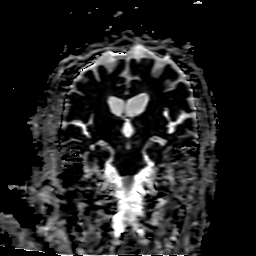
[im 35/35]
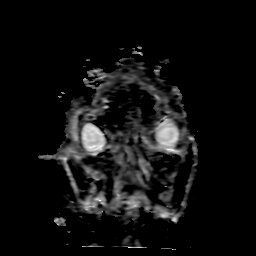

[29 of 48 positions shown; findings below may reference images not displayed]

FINDINGS: Brain: Examination degraded by motion artifact.

Age-related cerebral atrophy. Patchy and confluent T2/FLAIR
hyperintensity within the periventricular and deep white matter both
cerebral hemispheres, most consistent with chronic small vessel
ischemic disease, mild-to-moderate in nature.

Acute to subacute ischemic infarct involving the central pons
measures 1.5 x 0.9 cm (series 3, image 14). No associated hemorrhage
or mass effect. No other evidence for acute or subacute ischemia.
Gray-white matter differentiation otherwise maintained. No other
areas of remote cortical infarction. No other evidence for acute or
chronic intracranial hemorrhage.

No mass lesion, mass effect or midline shift. No hydrocephalus or
extra-axial fluid collection. Pituitary gland suprasellar region
within normal limits. Midline structures intact.

Vascular: Major intracranial vascular flow voids are grossly
maintained on this motion degraded exam.

Skull and upper cervical spine: Craniocervical junction within
normal limits. Bone marrow signal intensity grossly normal. No scalp
soft tissue abnormality.

Sinuses/Orbits: Globes and orbital soft tissues demonstrate no
obvious abnormality. Paranasal sinuses are largely clear. No
significant mastoid effusion.

Other: None.
IMPRESSION: 1. 1.5 cm acute to subacute ischemic nonhemorrhagic central pontine
infarct.
2. Underlying mild to moderate chronic microvascular ischemic
disease.

## 2021-07-21 IMAGING — CT CT ANGIO NECK
1 of 12 series · 4 of 35 positions shown · IV contrast (omnipaque)
Comparison: Brain MRI [N8] hours today.  Head CT [DATE].

CLINICAL DATA: 75-year-old female with neurologic deficit. Central
brainstem infarct on MRI earlier today. Active spinal discitis
osteomyelitis. Cervical ACDF last month.

EXAM:
CT ANGIOGRAPHY HEAD AND NECK
TECHNIQUE: Multidetector CT imaging of the head and neck was performed using
the standard protocol during bolus administration of intravenous
contrast. Multiplanar CT image reconstructions and MIPs were
obtained to evaluate the vascular anatomy. Carotid stenosis
measurements (when applicable) are obtained utilizing NASCET
criteria, using the distal internal carotid diameter as the
denominator.
CONTRAST:  75mL OMNIPAQUE IOHEXOL 350 MG/ML SOLN

[Series 13: ax thins · axial · 0.39mm/px · z∈[-266,-56]mm · 4 of 354 slices shown]
[im 71/354  soft-tissue]
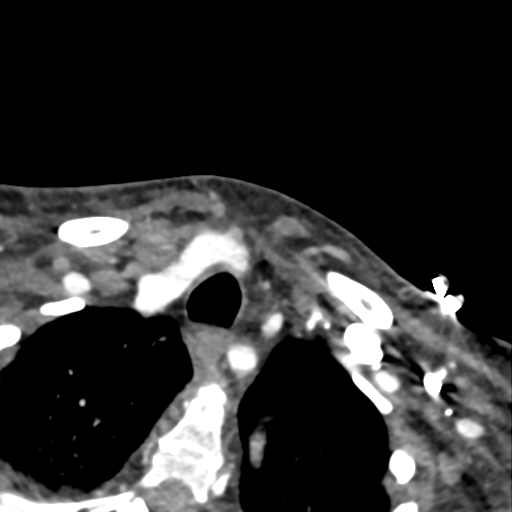
[im 142/354  bone]
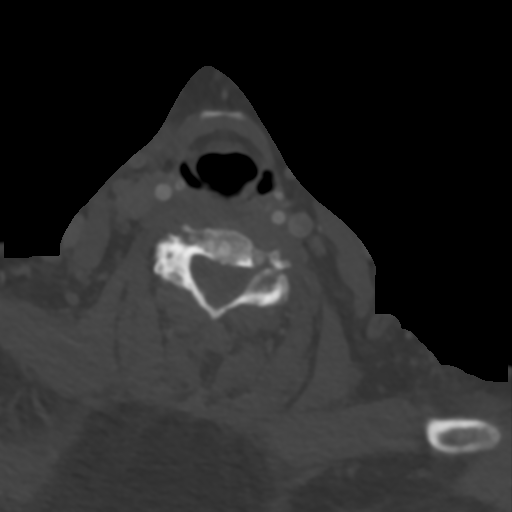
[im 212/354  soft-tissue]
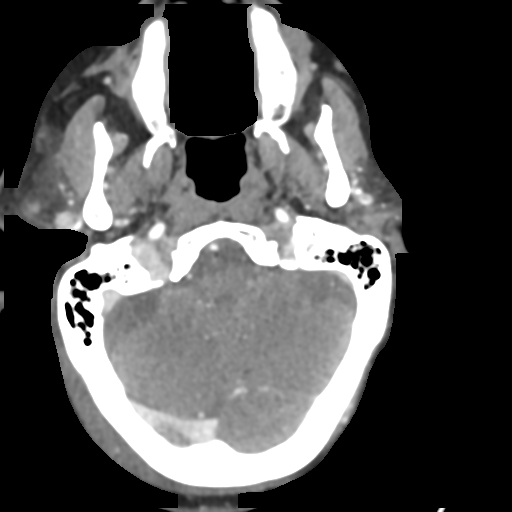
[im 283/354  bone]
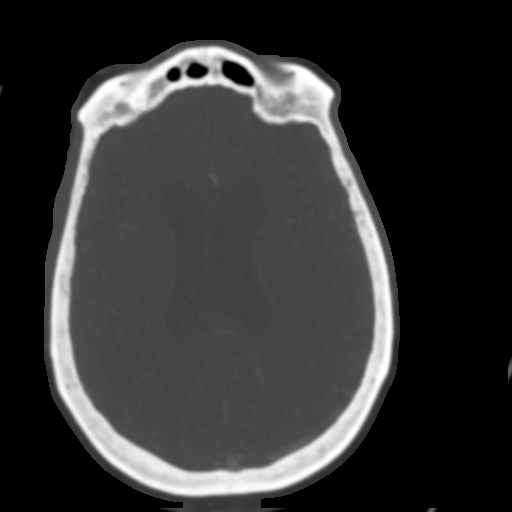

[4 of 35 positions shown; findings below may reference images not displayed]

FINDINGS: CT HEAD

Brain: Confluent hypodensity in the central pons is less distinct
but otherwise stable from the head CT yesterday. No associated
hemorrhage or mass effect. Stable gray-white matter differentiation
elsewhere. No midline shift or ventriculomegaly. No acute
intracranial hemorrhage identified.

Calvarium and skull base: Congenital incomplete ossification of the
posterior C1 ring. No acute osseous abnormality identified.

Paranasal sinuses: Visualized paranasal sinuses and mastoids are
stable and well aerated.

Orbits: No acute orbit or scalp soft tissue finding.

CTA NECK

Skeleton: Absent maxillary dentition. Recent C5-C6 ACDF. Advanced
cervical spine degeneration elsewhere. Partially visible thoracic
kyphoscoliosis. No acute osseous abnormality identified, but there
is prevertebral phlegmon extending from just below the ACDF hardware
to the thoracic inlet, T2 level. No discrete or enhancing fluid
collection.

Upper chest: Evidence of small bilateral layering pleural effusions
with atelectasis. Pulmonary septal thickening in the upper lungs. No
superior mediastinal lymphadenopathy. Visible central pulmonary
arteries are enhancing and appear patent.

Other neck: Mild postoperative soft tissue changes to the right
anterior neck and tracking along the right thyroid to the
prevertebral space related to recent ACDF. Otherwise negative.

Aortic arch: 3 vessel arch configuration. Mild arch atherosclerosis.

Right carotid system: Mildly tortuous brachiocephalic artery and
proximal right CCA without plaque or stenosis. Negative right
carotid bifurcation and cervical right ICA.

Left carotid system: Negative left CCA aside from mild tortuosity.
Mild calcified plaque at the posterior left ICA origin without
stenosis.

Vertebral arteries:
Normal proximal right subclavian artery aside from tortuosity.
Normal right vertebral artery origin. The V1 segment is affected by
the paraspinal phlegmon, but remains patent with no stenosis
identified. The right V2 and V3 segments are patent. No stenosis to
the skull base.

Proximal left subclavian artery and left vertebral artery origin are
normal. The left vertebral is mildly dominant. The V1 segment is
tortuous in the region of the paraspinal phlegmon. The vessel
remains patent without stenosis to the skull base.

CTA HEAD

Posterior circulation: Dominant left vertebral V4 segment primarily
supplies the basilar, the right V4 is diminutive beyond the patent
PICA origin. Left PICA origin is patent. Patent basilar artery. No
basilar stenosis. Patent SCA and PCA origins. Posterior
communicating arteries are diminutive or absent. Bilateral PCA
branches are patent with mild irregularity.

Anterior circulation: Both ICA siphons are patent. No right siphon
plaque or stenosis. No left siphon plaque, although the distal left
ICA appears mildly stenotic to the terminus - but might simply be
non dominant due to the ACA anatomy. Both carotid termini are
patent. Patent MCA origins. Right ACA A1 is dominant, the left is
diminutive or absent. Normal anterior communicating artery.
Bilateral ACA branches are within normal limits. Bilateral MCA M1
segments and bifurcations are somewhat diminutive but patent without
stenosis. No MCA branch occlusion identified, right MCA branches are
mildly irregular.

Venous sinuses: Superior sagittal sinus, dominant right transverse
and sigmoid sinus and right IJ bulb are patent.

Anatomic variants: Dominant left vertebral artery, the right
functionally terminates in PICA. Dominant right and diminutive or
absent left ACA A1 segments.

Review of the MIP images confirms the above findings
IMPRESSION: 1. Cervicothoracic junction paraspinal phlegmon related to discitis
osteomyelitis affects the bilateral Vertebral Artery V1 segments,
but with no associated vertebral artery stenosis or occlusion.
2. Negative for large vessel occlusion. Basilar Artery is patent
without stenosis.
3. Stable CT appearance of the brain since yesterday. Central
pontine infarct without associated hemorrhage or mass effect.
4. Mild for age atherosclerosis in the neck. More advanced
intracranial atherosclerosis, but no significant arterial stenosis
identified.
5. Recent C5-C6 ACDF.
6. Small layering pleural effusions with suspected pulmonary
interstitial edema.

## 2021-07-21 IMAGING — MR MR CERVICAL SPINE WO/W CM
6 of 8 series · 26 of 48 positions shown · IV contrast (gadavist)
Comparison: MRI from [DATE].

CLINICAL DATA: Initial evaluation for cervical radiculopathy,
infection suspected.

EXAM:
MRI CERVICAL SPINE WITHOUT AND WITH CONTRAST
TECHNIQUE: Multiplanar and multiecho pulse sequences of the cervical spine, to
include the craniocervical junction and cervicothoracic junction,
were obtained without and with intravenous contrast.
CONTRAST:  8mL GADAVIST GADOBUTROL 1 MMOL/ML IV SOLN

[Series 2: T1 post-contrast · sagittal · 3.0mm · 0.47mm/px · 2 of 18 slices shown (1 of 2)]
[im 1/18]
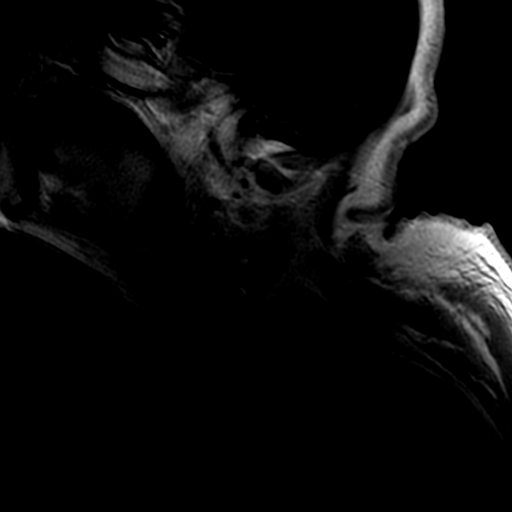
[im 18/18]
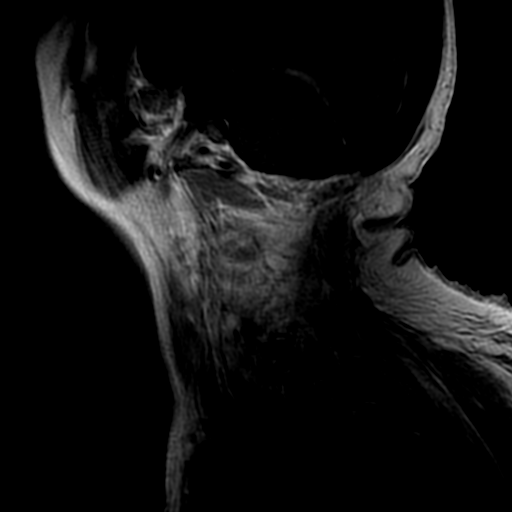

[Series 2: T2 · sagittal · 3.0mm · 0.43mm/px · 3 of 19 slices shown (1 of 2)]
[im 1/19]
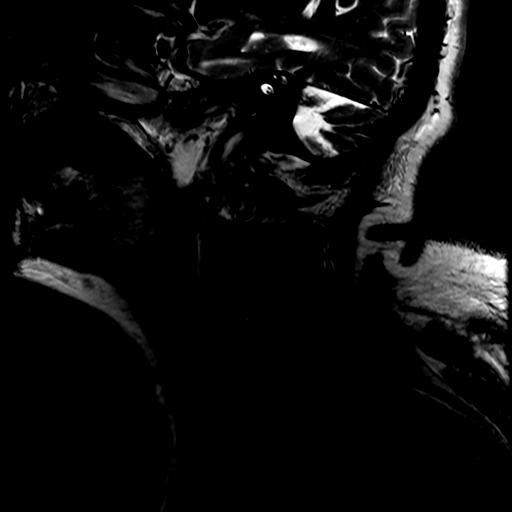
[im 10/19]
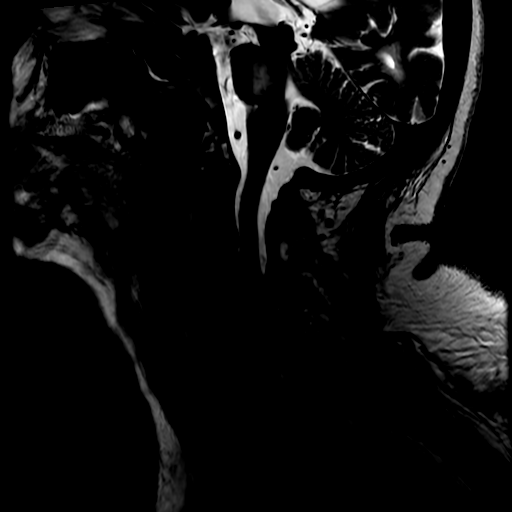
[im 19/19]
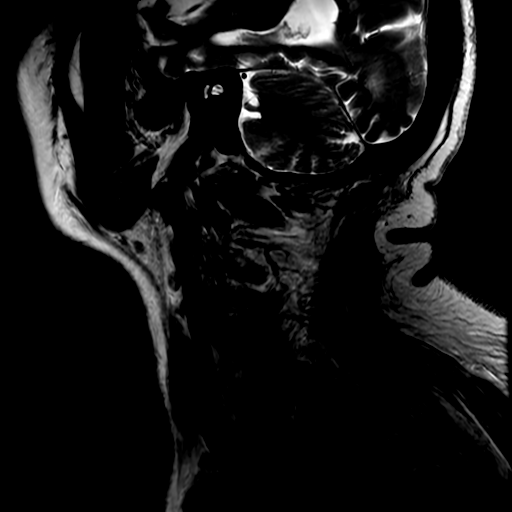

[Series 3: T1 post-contrast · axial · 3.0mm · 0.78mm/px · z∈[-152,+4]mm · 6 of 42 slices shown (2 of 2)]
[im 1/42]
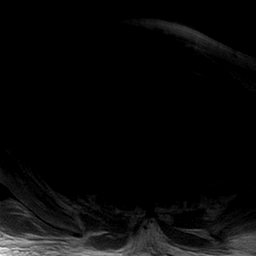
[im 9/42]
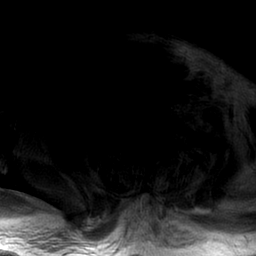
[im 17/42]
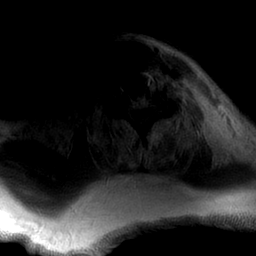
[im 25/42]
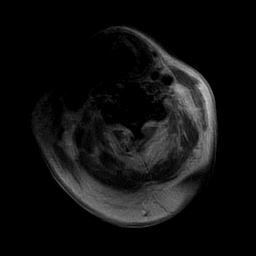
[im 33/42]
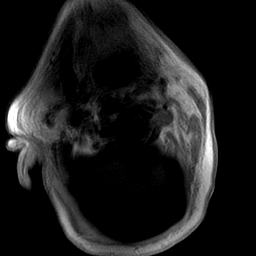
[im 42/42]
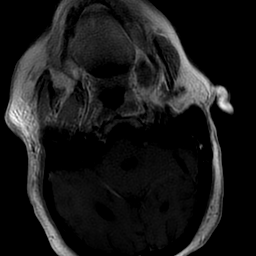

[Series 4: STIR · sagittal · 3.0mm · 0.43mm/px · 3 of 19 slices shown]
[im 1/19]
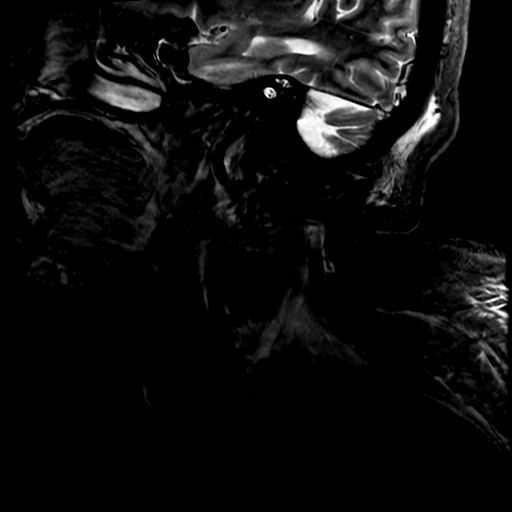
[im 10/19]
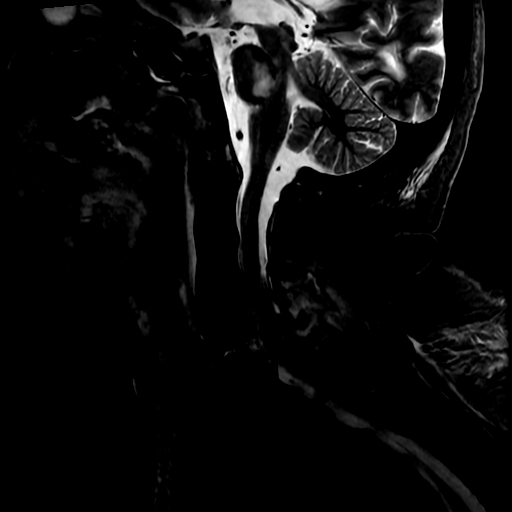
[im 19/19]
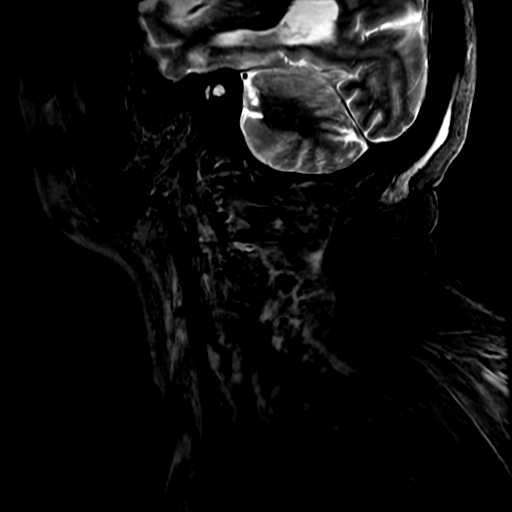

[Series 7: T1 · axial · 3.0mm · 0.70mm/px · z∈[-135,+1]mm · 6 of 37 slices shown]
[im 1/37]
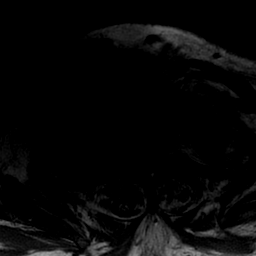
[im 8/37]
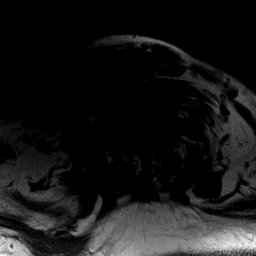
[im 15/37]
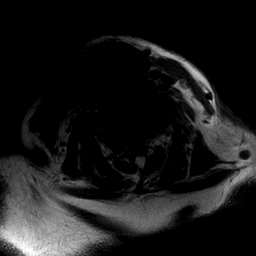
[im 22/37]
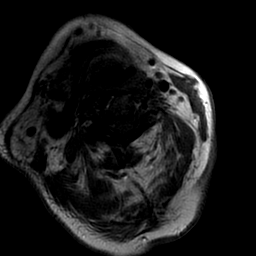
[im 29/37]
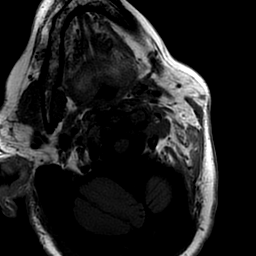
[im 37/37]
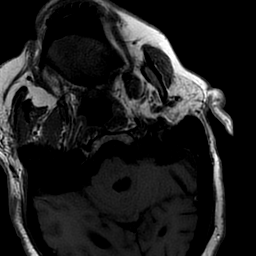

[Series 8: T2 · axial · 3.0mm · 0.35mm/px · z∈[-135,+1]mm · 6 of 37 slices shown (2 of 2)]
[im 1/37]
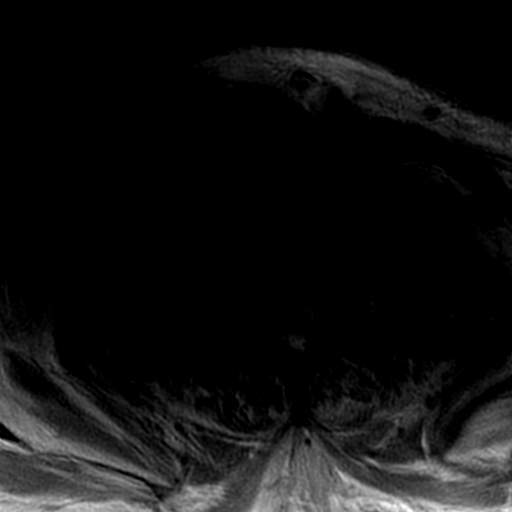
[im 8/37]
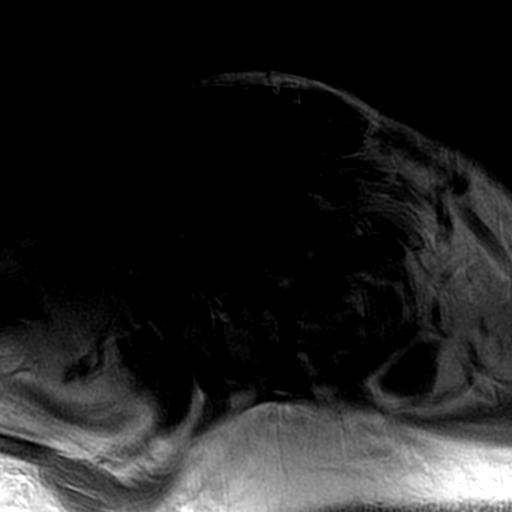
[im 15/37]
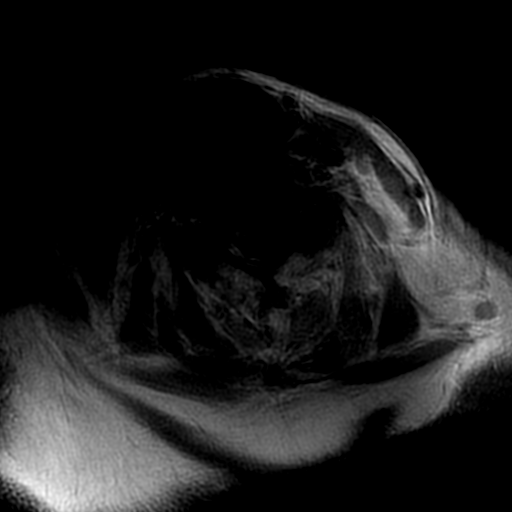
[im 22/37]
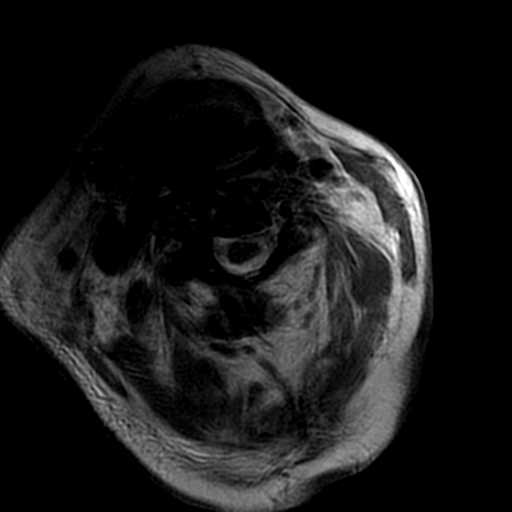
[im 29/37]
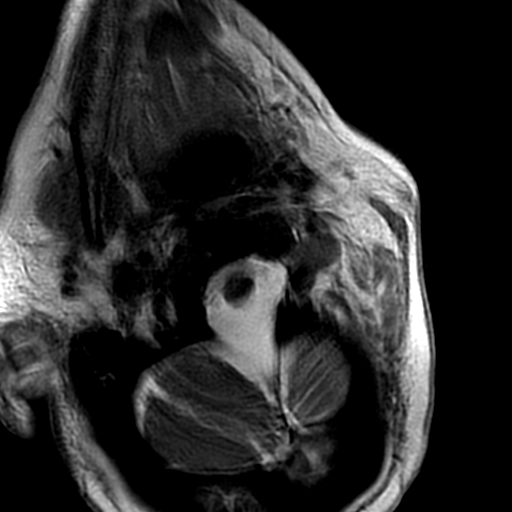
[im 37/37]
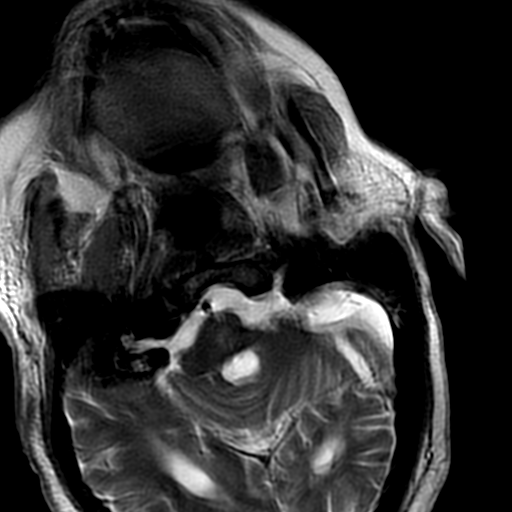

[26 of 48 positions shown; findings below may reference images not displayed]

FINDINGS: Alignment: Examination severely degraded by motion artifact.

Exaggeration of the normal cervical lordosis.  No listhesis.

Vertebrae: Patient has undergone ACDF at C5-6 since prior exam.
There is persistent marrow edema with fluid signal intensity within
the C5-6 interspace, concerning for persistent and/or recurrent
infection. Additionally, abnormal marrow edema in enhancement seen
about the C6-7, C7-T1, and likely T1-2 interspaces, also concerning
for osteomyelitis discitis. Associated epidural phlegmon/abscess
within the ventral epidural space extending from C4 through C6-7
(series 2, image 11). Abnormal enhancement seen diffusely within the
dorsal epidural space extending from C2 through the upper thoracic
spine. Probable small amount of ventral epidural phlegmonous change
at the T1-2 level as well. Associated prevertebral edema, with
additional edema and enhancement within the posterior paraspinous
soft tissues. No visible discrete soft tissue abscess on this motion
degraded exam.

Cord: Mild flattening and deformity of the cervical cord extending
from C4 through C6-7 by the ventral epidural phlegmon/collection. No
visible cord signal changes on this motion degraded exam.

Posterior Fossa, vertebral arteries, paraspinal tissues: Evolving
acute to subacute pontine infarct noted. Paraspinous and
prevertebral edema as above.

Disc levels:

C2-C3: Small right paracentral disc protrusion without stenosis or
impingement. Foramina remain patent.

C3-C4: Mild disc bulge with uncovertebral and facet hypertrophy.
Mild spinal stenosis. No more than mild bilateral foraminal
narrowing.

C4-C5: Epidural phlegmon/abscess within the ventral epidural space.
Moderate spinal stenosis with flattening of the ventral cord.
Evaluation of the neural foramina limited by motion.

C5-C6: Prior ACDF. Persistent ventral epidural abscess/phlegmon.
Moderate spinal stenosis with mild cord flattening but no visible
cord signal changes. Evaluation of the neural foramina limited by
motion.

C6-C7: Ventral epidural phlegmon/abscess with mild-to-moderate
spinal stenosis. Mild cord flattening without visible cord signal
changes. Evaluation of the foramina limited by motion.

C7-T1: Changes of osteomyelitis discitis. Bilateral facet spurring,
greater on the right. No significant spinal stenosis. Suspected
moderate bilateral foraminal narrowing.
IMPRESSION: 1. Technically limited exam due to motion artifact.
2. Sequelae of interval ACDF at C5-6. Evidence for persistent and/or
recurrent osteomyelitis discitis at C5-6, C6-7, C7-T1, and likely
T1-2.
3. Associated ventral epidural phlegmon/abscess extending from C4
through C6-7 with up to moderate spinal stenosis at these levels. No
visible cord signal changes on this motion degraded exam.
4. Associated prevertebral and posterior paraspinous edema without
visible discrete soft tissue abscess on this motion degraded exam.
5. Evolving acute to subacute pontine infarct, better evaluated on
prior brain MRI.

## 2021-07-21 IMAGING — MR MR LUMBAR SPINE WO/W CM
4 of 7 series · 17 of 48 positions shown · IV contrast (Yes GAD)
Comparison: MRI from [DATE]

CLINICAL DATA: Initial evaluation for low back pain.

EXAM:
MRI LUMBAR SPINE WITHOUT AND WITH CONTRAST
TECHNIQUE: Multiplanar and multiecho pulse sequences of the lumbar spine were
obtained without and with intravenous contrast.
CONTRAST:  8mL GADAVIST GADOBUTROL 1 MMOL/ML IV SOLN

[Series 3: T2 · sagittal · 4.0mm · 0.55mm/px · 3 of 15 slices shown (1 of 2)]
[im 1/15]
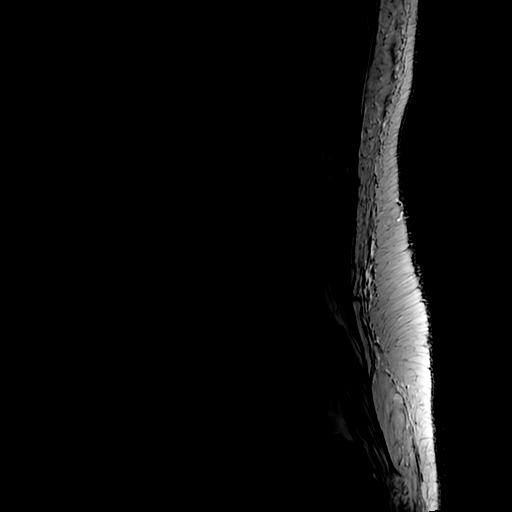
[im 8/15]
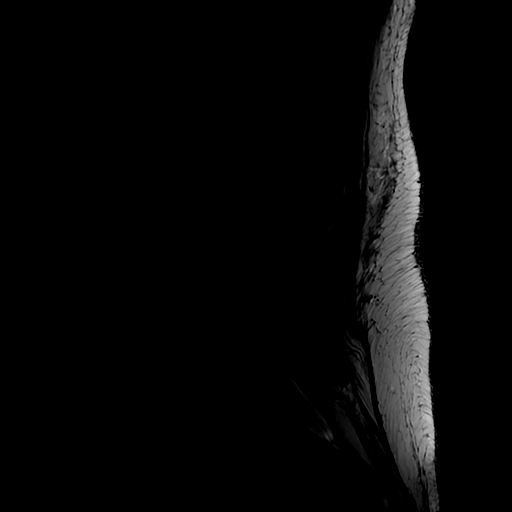
[im 15/15]
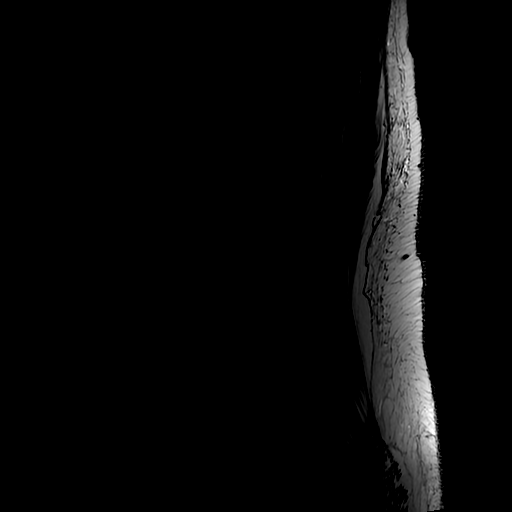

[Series 5: T1 · sagittal · 4.0mm · 0.55mm/px · 3 of 15 slices shown (1 of 2)]
[im 1/15]
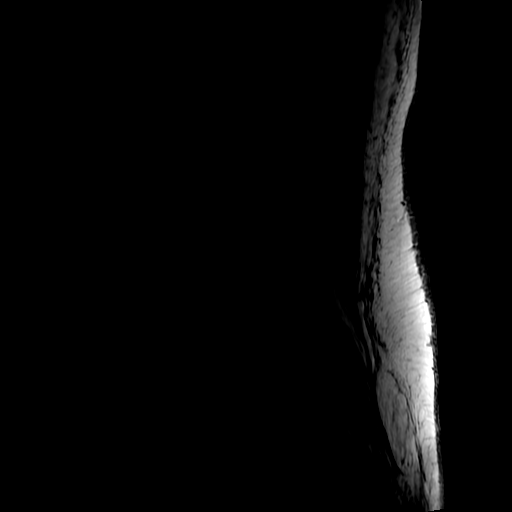
[im 10/15]
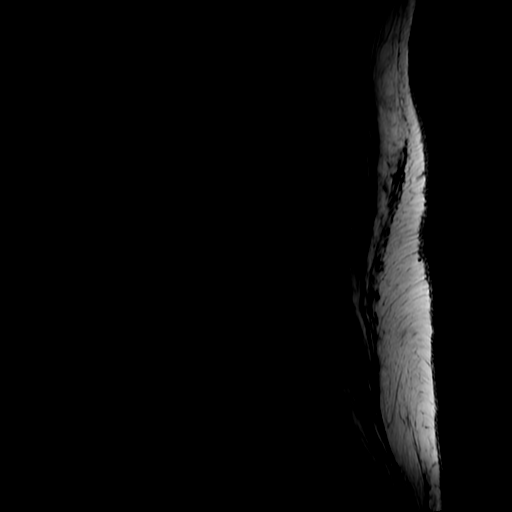
[im 15/15]
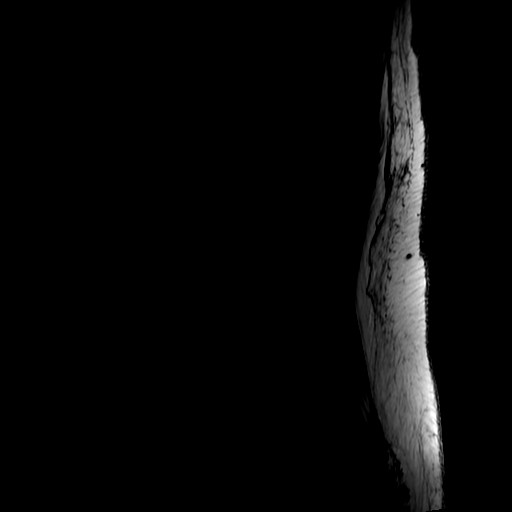

[Series 6: T2 · axial · 4.0mm · 0.39mm/px · z∈[+60,+243]mm · 8 of 43 slices shown (2 of 2)]
[im 1/43]
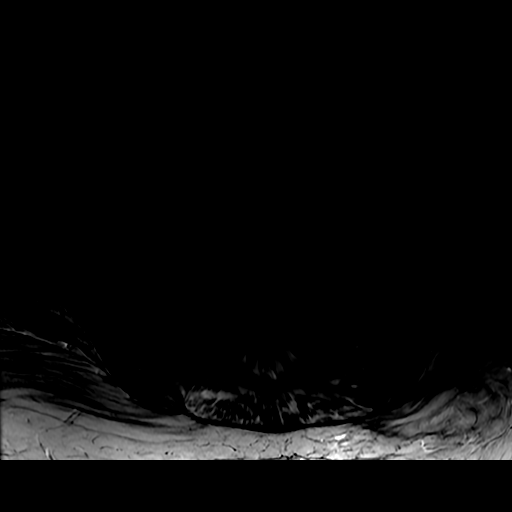
[im 5/43]
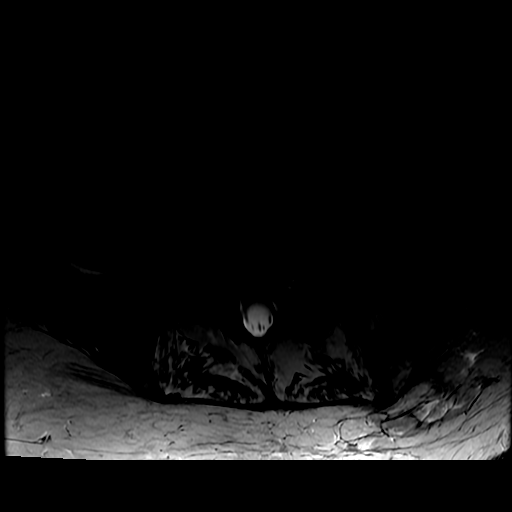
[im 9/43]
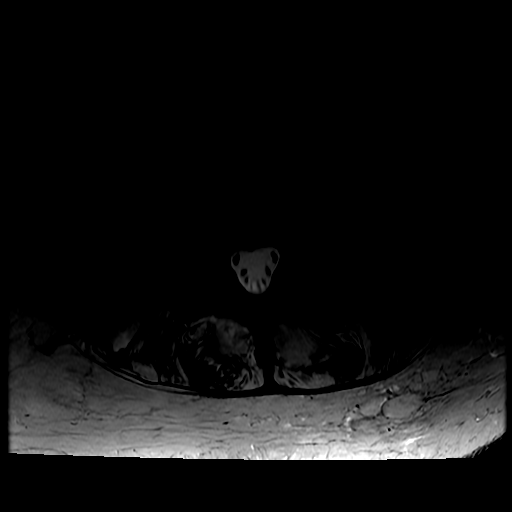
[im 13/43]
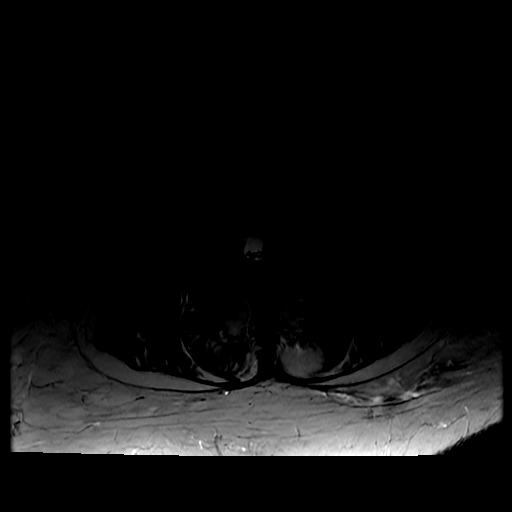
[im 17/43]
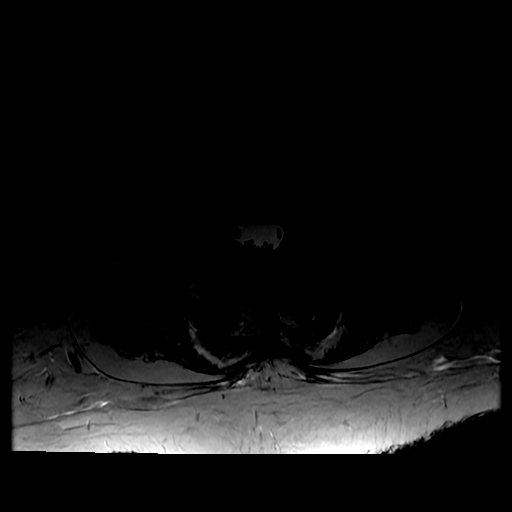
[im 22/43]
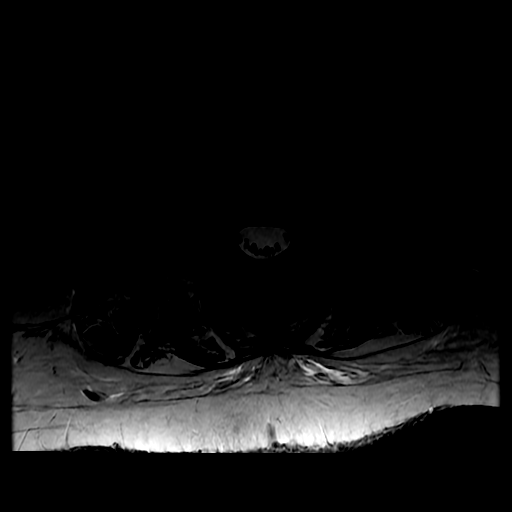
[im 26/43]
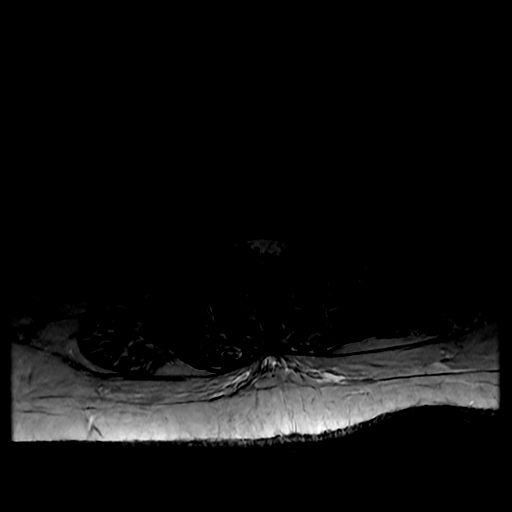
[im 38/43]
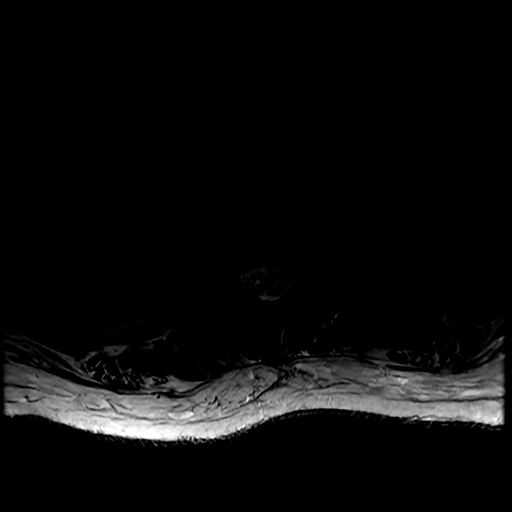

[Series 7: T1 · axial · 4.0mm · 0.39mm/px · z∈[+79,+243]mm · 3 of 43 slices shown (2 of 2)]
[im 5/43]
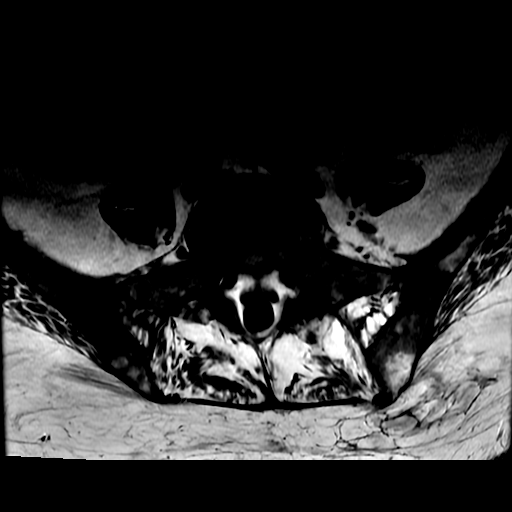
[im 22/43]
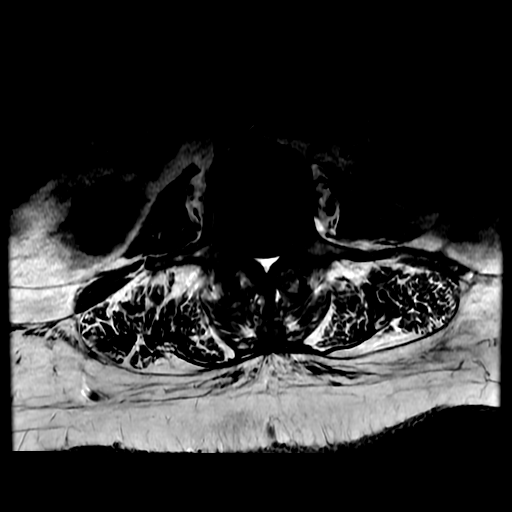
[im 38/43]
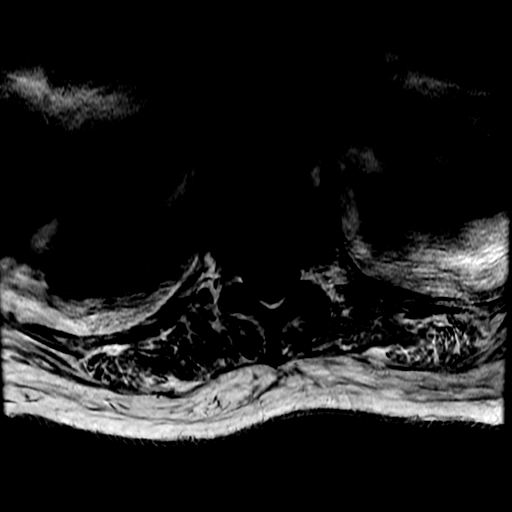

[17 of 48 positions shown; findings below may reference images not displayed]

FINDINGS: Segmentation: Standard. Lowest well-formed disc space labeled the
L5-S1 level.

Alignment: Trace retrolisthesis of L2 on L3 and L3 on L4, with 3 mm
anterolisthesis of L4 on L5, chronic and facet mediated.

Vertebrae: Changes consistent with osteomyelitis discitis present at
T12-L1. No significant epidural phlegmon or abscess. Probable tiny 7
mm abscess along the right margin of the T12-L1 disc space (series
6, image 5).

Irregularity and enhancement seen about the partially visualized
right SI joint, suspicious for septic arthritis. There is edema and
enhancement within the adjacent right iliacus muscle as well as the
right posterior soft tissues and musculature. Superimposed abscess
measuring 2.2 x 1.4 cm is seen within the right posterior soft
tissues at the level of L5 (series 9, image 35).

No other convincing evidence for osteomyelitis discitis or septic
arthritis. Vertebral body height maintained. Bone marrow signal
intensity diffusely heterogeneous. No worrisome osseous lesions.

Conus medullaris and cauda equina: Conus extends to the T12-L1
level. Conus and cauda equina appear normal.

Paraspinal and other soft tissues: Remainder of the visualized soft
tissues demonstrate no other acute finding. 2.7 cm simple left renal
cyst noted.

Disc levels:

T12-L1: Findings consistent with osteomyelitis discitis.
Superimposed mild disc bulge with reactive endplate change. No
spinal stenosis. Foramina remain patent.

L1-2: Mild disc bulge with disc desiccation. Mild right-sided facet
hypertrophy. No stenosis.

L2-3: Disc bulge with disc desiccation, asymmetric to the left.
Moderate bilateral facet hypertrophy. Resultant moderate left with
mild right lateral recess narrowing. Central canal remains patent.
No significant foraminal stenosis.

L3-4: Disc bulge with disc desiccation. Associated reactive endplate
spurring. Moderate right worse than left facet hypertrophy. Mild
narrowing of the lateral recesses. Central canal remains patent. No
significant foraminal stenosis.

L4-5: Anterolisthesis. Disc bulge with disc desiccation. Advanced
bilateral facet arthrosis. Resultant mild canal with mild to
moderate bilateral lateral recess stenosis. Mild bilateral L4
foraminal narrowing.

L5-S1: Disc desiccation with minimal disc bulge. Severe left worse
than right facet arthrosis. No stenosis or impingement.
IMPRESSION: 1. Changes consistent with osteomyelitis discitis at T12-L1.
Probable tiny 7 mm abscess along the right margin of the T12-L1 disc
space. No other significant epidural phlegmon or abscess.
2. Acute septic arthritis of the right SI joint. Associated edema
and enhancement within the adjacent right posterior paraspinous and
iliacus musculature. Superimposed 2.2 x 1.4 cm soft tissue abscess
as above.
3. Underlying multilevel lumbar spondylosis and facet arthrosis with
resultant mild-to-moderate lateral recess narrowing at L2-3 through
L4-5 as above.

## 2021-07-21 MED ORDER — ACETAMINOPHEN 650 MG RE SUPP: 650.0000 mg | Freq: Four times a day (QID) | RECTAL | Status: DC | PRN

## 2021-07-21 MED ORDER — GADOBUTROL 1 MMOL/ML IV SOLN
8.0000 mL | Freq: Once | INTRAVENOUS | Status: AC | PRN
  Administered 2021-07-21: 8 mL via INTRAVENOUS

## 2021-07-21 MED ORDER — ASPIRIN EC 81 MG PO TBEC
81.0000 mg | DELAYED_RELEASE_TABLET | Freq: Every day | ORAL | Status: DC
  Administered 2021-07-21 – 2021-07-24 (×4): 81 mg via ORAL
  Filled 2021-07-21 (×5): qty 1

## 2021-07-21 MED ORDER — OSMOLITE 1.2 CAL PO LIQD
1000.0000 mL | ORAL | Status: DC
  Administered 2021-07-22: 1000 mL
  Filled 2021-07-21: qty 1000

## 2021-07-21 MED ORDER — NAFCILLIN SODIUM 2 G IJ SOLR: 2.0000 g | INTRAMUSCULAR | Status: DC

## 2021-07-21 MED ORDER — MIDODRINE HCL 5 MG PO TABS
5.0000 mg | ORAL_TABLET | Freq: Three times a day (TID) | ORAL | Status: DC
  Administered 2021-07-21 – 2021-07-25 (×13): 5 mg
  Filled 2021-07-21 (×13): qty 1

## 2021-07-21 MED ORDER — ACETAMINOPHEN 325 MG PO TABS
650.0000 mg | ORAL_TABLET | Freq: Four times a day (QID) | ORAL | Status: DC | PRN
  Administered 2021-07-24: 650 mg
  Filled 2021-07-21 (×2): qty 2

## 2021-07-21 MED ORDER — IOHEXOL 350 MG/ML SOLN
75.0000 mL | Freq: Once | INTRAVENOUS | Status: AC | PRN
  Administered 2021-07-21: 75 mL via INTRAVENOUS

## 2021-07-21 MED ORDER — FREE WATER: 125.0000 mL | Freq: Four times a day (QID) | Status: DC

## 2021-07-21 MED ORDER — SODIUM CHLORIDE 0.9 % IV SOLN: INTRAVENOUS | Status: DC

## 2021-07-21 MED ORDER — ONDANSETRON HCL 4 MG PO TABS: 4.0000 mg | ORAL_TABLET | Freq: Four times a day (QID) | ORAL | Status: DC | PRN

## 2021-07-21 MED ORDER — SODIUM CHLORIDE 0.9 % IV SOLN
300.0000 mg | Freq: Two times a day (BID) | INTRAVENOUS | Status: DC
  Administered 2021-07-21 – 2021-07-22 (×2): 300 mg via INTRAVENOUS
  Filled 2021-07-21 (×3): qty 300

## 2021-07-21 MED ORDER — HYDROXYZINE HCL 25 MG PO TABS
25.0000 mg | ORAL_TABLET | Freq: Every day | ORAL | Status: DC | PRN
  Administered 2021-07-21: 25 mg
  Filled 2021-07-21 (×2): qty 1

## 2021-07-21 MED ORDER — DICLOFENAC SODIUM 1 % EX GEL
4.0000 g | Freq: Three times a day (TID) | CUTANEOUS | Status: DC
  Administered 2021-07-21 – 2021-08-06 (×55): 4 g via TOPICAL
  Filled 2021-07-21: qty 100

## 2021-07-21 MED ORDER — PROSOURCE TF PO LIQD
45.0000 mL | Freq: Two times a day (BID) | ORAL | Status: DC
  Administered 2021-07-21 – 2021-07-22 (×3): 45 mL
  Filled 2021-07-21 (×4): qty 45

## 2021-07-21 MED ORDER — ALLOPURINOL 100 MG PO TABS
100.0000 mg | ORAL_TABLET | Freq: Every day | ORAL | Status: DC
  Administered 2021-07-21 – 2021-08-06 (×17): 100 mg
  Filled 2021-07-21 (×18): qty 1

## 2021-07-21 MED ORDER — ACETAMINOPHEN 160 MG/5ML PO SOLN
650.0000 mg | Freq: Four times a day (QID) | ORAL | Status: DC
  Administered 2021-07-21 – 2021-07-28 (×28): 650 mg
  Filled 2021-07-21 (×28): qty 20.3

## 2021-07-21 MED ORDER — THIAMINE HCL 100 MG PO TABS
100.0000 mg | ORAL_TABLET | Freq: Every day | ORAL | Status: DC
  Administered 2021-07-21 – 2021-07-22 (×2): 100 mg
  Filled 2021-07-21 (×2): qty 1

## 2021-07-21 MED ORDER — OSMOLITE 1.2 CAL PO LIQD
1000.0000 mL | ORAL | Status: DC
  Administered 2021-07-21: 1000 mL
  Filled 2021-07-21: qty 1000

## 2021-07-21 MED ORDER — HYDROXYZINE HCL 25 MG PO TABS
25.0000 mg | ORAL_TABLET | Freq: Four times a day (QID) | ORAL | Status: DC | PRN
  Administered 2021-07-22 – 2021-07-26 (×12): 25 mg
  Filled 2021-07-21 (×12): qty 1

## 2021-07-21 MED ORDER — ONDANSETRON HCL 4 MG/2ML IJ SOLN: 4.0000 mg | Freq: Four times a day (QID) | INTRAMUSCULAR | Status: DC | PRN

## 2021-07-21 MED ORDER — STROKE: EARLY STAGES OF RECOVERY BOOK: Freq: Once | Status: DC

## 2021-07-21 MED ORDER — CHLORHEXIDINE GLUCONATE CLOTH 2 % EX PADS
6.0000 | MEDICATED_PAD | Freq: Every day | CUTANEOUS | Status: DC
  Administered 2021-07-22 – 2021-08-06 (×15): 6 via TOPICAL

## 2021-07-21 MED ORDER — OXYCODONE HCL 5 MG PO TABS
5.0000 mg | ORAL_TABLET | Freq: Four times a day (QID) | ORAL | Status: DC | PRN
Start: 2021-07-21 — End: 2021-08-06
  Administered 2021-07-21 – 2021-08-05 (×10): 5 mg
  Filled 2021-07-21 (×11): qty 1

## 2021-07-21 MED ORDER — SODIUM CHLORIDE 0.9 % IV SOLN
2.0000 g | INTRAVENOUS | Status: DC
  Administered 2021-07-21 – 2021-07-22 (×4): 2 g via INTRAVENOUS
  Filled 2021-07-21 (×8): qty 2000

## 2021-07-21 MED ORDER — FREE WATER
125.0000 mL | Status: DC
  Administered 2021-07-21 – 2021-07-28 (×43): 125 mL

## 2021-07-21 NOTE — Progress Notes (Signed)
STROKE TEAM PROGRESS NOTE   INTERVAL HISTORY 75 year old female with recent cervical epidural abscess C2-T3 with acdf on 8/722 with cultures positive for MSSA, admitted with generalized weakness. MRI brain shows subacute midpontine stroke.     Vitals:   07/21/21 0600 07/21/21 0630 07/21/21 0700 07/21/21 0800  BP: (!) 132/121 137/70 (!) 120/101 (!) 156/78  Pulse: (!) 108 (!) 116 (!) 109 (!) 113  Resp: (!) 26 15 (!) 22 (!) 22  Temp:      TempSrc:      SpO2: 99% 100% 95% 100%   CBC:  Recent Labs  Lab 07/20/21 1754 07/20/21 1820 07/21/21 0500  WBC 12.5*  --  12.6*  NEUTROABS 8.8*  --   --   HGB 8.0* 8.2* 7.9*  HCT 26.0* 24.0* 25.2*  MCV 100.4*  --  99.6  PLT 212  --  204   Basic Metabolic Panel:  Recent Labs  Lab 07/20/21 1754 07/20/21 1820 07/21/21 0500  NA 137 140 135  K 4.1 4.2 3.8  CL 105 105 104  CO2 23  --  21*  GLUCOSE 79 80 86  BUN 26* 24* 21  CREATININE 0.77 0.60 0.78  CALCIUM 8.7*  --  8.6*    Lipid Panel:  Recent Labs  Lab 07/21/21 0500  CHOL 137  TRIG 100  HDL 29*  CHOLHDL 4.7  VLDL 20  LDLCALC 88    HgbA1c:  Recent Labs  Lab 07/21/21 0500  HGBA1C 5.1   Urine Drug Screen: No results for input(s): LABOPIA, COCAINSCRNUR, LABBENZ, AMPHETMU, THCU, LABBARB in the last 168 hours.  Alcohol Level No results for input(s): ETH in the last 168 hours.  IMAGING past 24 hours CT HEAD WO CONTRAST ( )  Result Date: 07/20/2021 CLINICAL DATA:  Mental status change EXAM: CT HEAD WITHOUT CONTRAST TECHNIQUE: Contiguous axial images were obtained from the base of the skull through the vertex without intravenous contrast. COMPARISON:  MRI 06/15/2021, CT brain 06/14/2021 FINDINGS: Brain: No hemorrhage or intracranial mass is visualized. New focal hypodensity within the pons, series 2, image 14, sagittal series 5, image 33. Mild motion degradation. Atrophy and mild chronic small vessel ischemic changes of the white matter. Stable ventricle size. Vascular: No  hyperdense vessels.  No unexpected calcification Skull: Normal. Negative for fracture or focal lesion. Sinuses/Orbits: No acute finding. Other: Rotated appearance of C1 on C2 likely due to head position. Incomplete fusion posterior arch of C1 IMPRESSION: 1. Findings are suspicious for acute to subacute pontine infarct, this is new compared to the previous exams from August. No hemorrhage identified 2. Atrophy and chronic small vessel ischemic changes of the white matter Electronically Signed   By: Jasmine Pang M.D.   On: 07/20/2021 20:06   CT Chest W Contrast  Result Date: 07/20/2021 CLINICAL DATA:  Abdominal pain and fever. EXAM: CT CHEST, ABDOMEN, AND PELVIS WITH CONTRAST TECHNIQUE: Multidetector CT imaging of the chest, abdomen and pelvis was performed following the standard protocol during bolus administration of intravenous contrast. CONTRAST:  75mL OMNIPAQUE IOHEXOL 350 MG/ML SOLN COMPARISON:  CT of the abdomen June 27, 2021 FINDINGS: CT CHEST FINDINGS Cardiovascular: Normal heart size. No pericardial effusion. No central pulmonary embolus. No dissection or aneurysmal dilation of the aorta. Mediastinum/Nodes: No enlarged mediastinal, hilar, or axillary lymph nodes. Thyroid gland, trachea, and esophagus demonstrate no significant findings. Lungs/Pleura: Small bilateral pleural effusions. Small amount of airspace consolidation versus atelectasis in the bilateral lung bases. Musculoskeletal: No chest wall mass or suspicious bone  lesions identified. CT ABDOMEN PELVIS FINDINGS Hepatobiliary: No focal liver abnormality is seen. No gallstones, gallbladder wall thickening, or biliary dilatation. Pancreas: Unremarkable. No pancreatic ductal dilatation or surrounding inflammatory changes. Spleen: Normal in size without focal abnormality. Adrenals/Urinary Tract: Normal adrenal glands. Bilateral too small to be actually characterized hypoattenuated masses. Stable cystic appearing lesion in the lower pole of the  left kidney. Stomach/Bowel: Gastrostomy tube is appropriately positioned within the stomach without significant surrounding inflammatory changes. No evidence of small-bowel obstruction. Normal caliber of the colon. Vascular/Lymphatic: Aortic atherosclerosis. No enlarged abdominal or pelvic lymph nodes. Reproductive: Status post hysterectomy. No adnexal masses. Other: No abdominal wall hernia or abnormality. No abdominopelvic ascites. Musculoskeletal: There has been an interval development of dystrophic changes involving the inferior endplate of T12 vertebral body and superior endplate of L1 vertebral body. There is an associated narrowing of the intervening intervertebral disc and small amount of soft tissue thickening anteriorly. This finding is new when compared to patient's prior CT dated June 27, 2021 IMPRESSION: 1. Small bilateral pleural effusions. 2. Small amount of airspace consolidation versus atelectasis in the bilateral lung bases. 3. Interval development of dystrophic changes involving the inferior endplate of T12 vertebral body and superior endplate of L1 vertebral body. There is an associated narrowing of the intervening intervertebral disc and small amount of soft tissue thickening anteriorly. This finding is new when compared to patient's prior CT dated June 27, 2021, and therefore may represent sequela of discitis/osteomyelitis. Differential diagnosis includes asymmetric discogenic osteoarthritic endplate changes. Please correlate to patient's symptoms. If further imaging evaluation is desired, contrast enhanced MRI of the lumbar spine may be considered. 4. Gastrostomy tube appropriately positioned within the stomach without significant surrounding inflammatory changes. 5. Bilateral too small to be actually characterized hypoattenuated masses in both kidneys. Aortic Atherosclerosis (ICD10-I70.0). Electronically Signed   By: Ted Mcalpine M.D.   On: 07/20/2021 20:28   MR BRAIN WO  CONTRAST  Result Date: 07/21/2021 CLINICAL DATA:  Initial evaluation for mental status change. EXAM: MRI HEAD WITHOUT CONTRAST TECHNIQUE: Multiplanar, multiecho pulse sequences of the brain and surrounding structures were obtained without intravenous contrast. COMPARISON:  CT from 07/20/2021. FINDINGS: Brain: Examination degraded by motion artifact. Age-related cerebral atrophy. Patchy and confluent T2/FLAIR hyperintensity within the periventricular and deep white matter both cerebral hemispheres, most consistent with chronic small vessel ischemic disease, mild-to-moderate in nature. Acute to subacute ischemic infarct involving the central pons measures 1.5 x 0.9 cm (series 3, image 14). No associated hemorrhage or mass effect. No other evidence for acute or subacute ischemia. Gray-white matter differentiation otherwise maintained. No other areas of remote cortical infarction. No other evidence for acute or chronic intracranial hemorrhage. No mass lesion, mass effect or midline shift. No hydrocephalus or extra-axial fluid collection. Pituitary gland suprasellar region within normal limits. Midline structures intact. Vascular: Major intracranial vascular flow voids are grossly maintained on this motion degraded exam. Skull and upper cervical spine: Craniocervical junction within normal limits. Bone marrow signal intensity grossly normal. No scalp soft tissue abnormality. Sinuses/Orbits: Globes and orbital soft tissues demonstrate no obvious abnormality. Paranasal sinuses are largely clear. No significant mastoid effusion. Other: None. IMPRESSION: 1. 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 2. Underlying mild to moderate chronic microvascular ischemic disease. Electronically Signed   By: Rise Mu M.D.   On: 07/21/2021 01:41   MR CERVICAL SPINE W WO CONTRAST  Result Date: 07/21/2021 CLINICAL DATA:  Initial evaluation for cervical radiculopathy, infection suspected.  EXAM: MRI CERVICAL  SPINE WITHOUT AND WITH CONTRAST TECHNIQUE: Multiplanar and multiecho pulse sequences of the cervical spine, to include the craniocervical junction and cervicothoracic junction, were obtained without and with intravenous contrast. CONTRAST:  8mL GADAVIST GADOBUTROL 1 MMOL/ML IV SOLN COMPARISON:  MRI from 06/16/2021. FINDINGS: Alignment: Examination severely degraded by motion artifact. Exaggeration of the normal cervical lordosis.  No listhesis. Vertebrae: Patient has undergone ACDF at C5-6 since prior exam. There is persistent marrow edema with fluid signal intensity within the C5-6 interspace, concerning for persistent and/or recurrent infection. Additionally, abnormal marrow edema in enhancement seen about the C6-7, C7-T1, and likely T1-2 interspaces, also concerning for osteomyelitis discitis. Associated epidural phlegmon/abscess within the ventral epidural space extending from C4 through C6-7 (series 2, image 11). Abnormal enhancement seen diffusely within the dorsal epidural space extending from C2 through the upper thoracic spine. Probable small amount of ventral epidural phlegmonous change at the T1-2 level as well. Associated prevertebral edema, with additional edema and enhancement within the posterior paraspinous soft tissues. No visible discrete soft tissue abscess on this motion degraded exam. Cord: Mild flattening and deformity of the cervical cord extending from C4 through C6-7 by the ventral epidural phlegmon/collection. No visible cord signal changes on this motion degraded exam. Posterior Fossa, vertebral arteries, paraspinal tissues: Evolving acute to subacute pontine infarct noted. Paraspinous and prevertebral edema as above. Disc levels: C2-C3: Small right paracentral disc protrusion without stenosis or impingement. Foramina remain patent. C3-C4: Mild disc bulge with uncovertebral and facet hypertrophy. Mild spinal stenosis. No more than mild bilateral foraminal narrowing.  C4-C5: Epidural phlegmon/abscess within the ventral epidural space. Moderate spinal stenosis with flattening of the ventral cord. Evaluation of the neural foramina limited by motion. C5-C6: Prior ACDF. Persistent ventral epidural abscess/phlegmon. Moderate spinal stenosis with mild cord flattening but no visible cord signal changes. Evaluation of the neural foramina limited by motion. C6-C7: Ventral epidural phlegmon/abscess with mild-to-moderate spinal stenosis. Mild cord flattening without visible cord signal changes. Evaluation of the foramina limited by motion. C7-T1: Changes of osteomyelitis discitis. Bilateral facet spurring, greater on the right. No significant spinal stenosis. Suspected moderate bilateral foraminal narrowing. IMPRESSION: 1. Technically limited exam due to motion artifact. 2. Sequelae of interval ACDF at C5-6. 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 3. Associated ventral epidural phlegmon/abscess extending from C4 through C6-7 with up to moderate spinal stenosis at these levels. No visible cord signal changes on this motion degraded exam. 4. Associated prevertebral and posterior paraspinous edema without visible discrete soft tissue abscess on this motion degraded exam. 5. Evolving acute to subacute pontine infarct, better evaluated on prior brain MRI. Electronically Signed   By: Rise MuBenjamin  McClintock M.D.   On: 07/21/2021 02:37   MR Lumbar Spine W Wo Contrast  Result Date: 07/21/2021 CLINICAL DATA:  Initial evaluation for low back pain. EXAM: MRI LUMBAR SPINE WITHOUT AND WITH CONTRAST TECHNIQUE: Multiplanar and multiecho pulse sequences of the lumbar spine were obtained without and with intravenous contrast. CONTRAST:  8mL GADAVIST GADOBUTROL 1 MMOL/ML IV SOLN COMPARISON:  MRI from 06/16/2021 FINDINGS: Segmentation: Standard. Lowest well-formed disc space labeled the L5-S1 level. Alignment: Trace retrolisthesis of L2 on L3 and  L3 on L4, with 3 mm anterolisthesis of L4 on L5, chronic and facet mediated. Vertebrae: Changes consistent with osteomyelitis discitis present at T12-L1. No significant epidural phlegmon or abscess. Probable tiny 7 mm abscess along the right margin of the T12-L1 disc space (series 6, image 5). Irregularity and enhancement seen about  the partially visualized right SI joint, suspicious for septic arthritis. There is edema and enhancement within the adjacent right iliacus muscle as well as the right posterior soft tissues and musculature. Superimposed abscess measuring 2.2 x 1.4 cm is seen within the right posterior soft tissues at the level of L5 (series 9, image 35). No other convincing evidence for osteomyelitis discitis or septic arthritis. Vertebral body height maintained. Bone marrow signal intensity diffusely heterogeneous. No worrisome osseous lesions. Conus medullaris and cauda equina: Conus extends to the T12-L1 level. Conus and cauda equina appear normal. Paraspinal and other soft tissues: Remainder of the visualized soft tissues demonstrate no other acute finding. 2.7 cm simple left renal cyst noted. Disc levels: T12-L1: Findings consistent with osteomyelitis discitis. Superimposed mild disc bulge with reactive endplate change. No spinal stenosis. Foramina remain patent. L1-2: Mild disc bulge with disc desiccation. Mild right-sided facet hypertrophy. No stenosis. L2-3: Disc bulge with disc desiccation, asymmetric to the left. Moderate bilateral facet hypertrophy. Resultant moderate left with mild right lateral recess narrowing. Central canal remains patent. No significant foraminal stenosis. L3-4: Disc bulge with disc desiccation. Associated reactive endplate spurring. Moderate right worse than left facet hypertrophy. Mild narrowing of the lateral recesses. Central canal remains patent. No significant foraminal stenosis. L4-5: Anterolisthesis. Disc bulge with disc desiccation. Advanced bilateral facet  arthrosis. Resultant mild canal with mild to moderate bilateral lateral recess stenosis. Mild bilateral L4 foraminal narrowing. L5-S1: Disc desiccation with minimal disc bulge. Severe left worse than right facet arthrosis. No stenosis or impingement. IMPRESSION: 1. 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 Probable tiny 7 mm abscess along the right margin of the T12-L1 disc space. No other significant epidural phlegmon or abscess. 2. Acute septic arthritis of the right SI joint. Associated edema and enhancement within the adjacent right posterior paraspinous and iliacus musculature. Superimposed 2.2 x 1.4 cm soft tissue abscess as above. 3. Underlying multilevel lumbar spondylosis and facet arthrosis with resultant mild-to-moderate lateral recess narrowing at L2-3 through L4-5 as above. Electronically Signed   By: Rise Mu M.D.   On: 07/21/2021 02:18   CT ABDOMEN PELVIS W CONTRAST  Result Date: 07/20/2021 CLINICAL DATA:  Abdominal pain and fever. EXAM: CT CHEST, ABDOMEN, AND PELVIS WITH CONTRAST TECHNIQUE: Multidetector CT imaging of the chest, abdomen and pelvis was performed following the standard protocol during bolus administration of intravenous contrast. CONTRAST:  62mL OMNIPAQUE IOHEXOL 350 MG/ML SOLN COMPARISON:  CT of the abdomen June 27, 2021 FINDINGS: CT CHEST FINDINGS Cardiovascular: Normal heart size. No pericardial effusion. No central pulmonary embolus. No dissection or aneurysmal dilation of the aorta. Mediastinum/Nodes: No enlarged mediastinal, hilar, or axillary lymph nodes. Thyroid gland, trachea, and esophagus demonstrate no significant findings. Lungs/Pleura: Small bilateral pleural effusions. Small amount of airspace consolidation versus atelectasis in the bilateral lung bases. Musculoskeletal: No chest wall mass or suspicious bone lesions identified. CT ABDOMEN PELVIS FINDINGS Hepatobiliary: No focal liver abnormality is seen. No gallstones, gallbladder wall  thickening, or biliary dilatation. Pancreas: Unremarkable. No pancreatic ductal dilatation or surrounding inflammatory changes. Spleen: Normal in size without focal abnormality. Adrenals/Urinary Tract: Normal adrenal glands. Bilateral too small to be actually characterized hypoattenuated masses. Stable cystic appearing lesion in the lower pole of the left kidney. Stomach/Bowel: Gastrostomy tube is appropriately positioned within the stomach without significant surrounding inflammatory changes. No evidence of small-bowel obstruction. Normal caliber of the colon. Vascular/Lymphatic: Aortic atherosclerosis. No enlarged abdominal or pelvic lymph nodes. Reproductive: Status post hysterectomy. No adnexal masses. Other: No abdominal wall hernia  or abnormality. No abdominopelvic ascites. Musculoskeletal: There has been an interval development of dystrophic changes involving the inferior endplate of T12 vertebral body and superior endplate of L1 vertebral body. There is an associated narrowing of the intervening intervertebral disc and small amount of soft tissue thickening anteriorly. This finding is new when compared to patient's prior CT dated June 27, 2021 IMPRESSION: 1. Small bilateral pleural effusions. 2. Small amount of airspace consolidation versus atelectasis in the bilateral lung bases. 3. Interval development of dystrophic changes involving the inferior endplate of T12 vertebral body and superior endplate of L1 vertebral body. There is an associated narrowing of the intervening intervertebral disc and small amount of soft tissue thickening anteriorly. This finding is new when compared to patient's prior CT dated June 27, 2021, and therefore may represent sequela of discitis/osteomyelitis. Differential diagnosis includes asymmetric discogenic osteoarthritic endplate changes. Please correlate to patient's symptoms. If further imaging evaluation is desired, contrast enhanced MRI of the lumbar spine may be  considered. 4. Gastrostomy tube appropriately positioned within the stomach without significant surrounding inflammatory changes. 5. Bilateral too small to be actually characterized hypoattenuated masses in both kidneys. Aortic Atherosclerosis (ICD10-I70.0). Electronically Signed   By: Ted Mcalpine M.D.   On: 07/20/2021 20:28   DG Chest Portable 1 View  Result Date: 07/20/2021 CLINICAL DATA:  Possible sepsis. EXAM: PORTABLE CHEST 1 VIEW COMPARISON:  June 30, 2021 FINDINGS: Cardiomediastinal silhouette is normal. Mediastinal contours appear intact. There is no evidence of focal airspace consolidation, pleural effusion or pneumothorax. Mild diffuse prominence of the interstitial markings. Osseous structures are without acute abnormality. Soft tissues are grossly normal. IMPRESSION: Mild diffuse prominence of the interstitial markings, which may represent pulmonary vascular congestion or early atypical pneumonia. Electronically Signed   By: Ted Mcalpine M.D.   On: 07/20/2021 17:14    PHYSICAL EXAM  Neurological exam Awake alert oriented to self Able to name objects.  Mildly dysarthric.  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  Palate is symmetric but the elevation is incomplete.  Tongue is midline. Motor exam: She is able to move all 4 extremities but is barely 3/5 in all 4 extremities.  she barely could be antigravity so I would rate her strength in all extremities proximally to be 2/5. Sensation: Intact to touch Coordination-cannot assess due to the above  ASSESSMENT/PLAN Ms. Kristy [name] is a 75 y.o. female with history of   history of hypertension, polycythemia, alcohol use disorder, recent cervical epidural abscess C2-T3  T4 status post ACDF on 06/16/2021 by neurosurgery with cultures positive for MSSA, hospital course complicated by dysphagia and encephalopathy, recurrent UTI, PEG tube placement, brought from a facility where the family when visiting the patient and found her to be more short of breath and more confused than usual.  History is obtained from the chart as patient is unable to provide reliable history.  Presumably, the daughter went to see the patient and she was not acting like her self.  She was also weak all over and all 4 extremities, which prompted call to EMS who brought her to the ER for further evaluation.  Unclear last known well. Patient was not able to provide much meaningful history by herself.  No family was present at the time of  this encounter.   She was admitted in August after sustaining multiple falls.  She was noted to be very encephalopathic.  She was evaluated by neurology and plan was for a lumbar puncture given nuchal rigidity and confusion.  Further imaging revealed that she had a spinal epidural abscess which required emergent operative treatment by neurosurgery.  She had a prolonged hospital course complicated by dysphagia, encephalopathy, recurrent UTIs.  Eventually placed on PEG tube and transferred to a SNF.  At baseline encephalopathic to the point where she is only oriented to self according to chart review.    Stroke:  midpontine  infarct - septic emboli versus cardoembolic versus large vessel etiology    CT head suspicious for acute to subacute pontine infarct, new compared from august. Atrophy and small vessel changes.    CTA head & neck  negative for large vessel occlusion.  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 2D Echo LVEF 60-65%, mild LVH, normal size LA, no IA shunt LDL 88 HgbA1c 5.1 VTE prophylaxis - scd     Diet   Diet NPO time specified   No antithrombotic prior to admission, now on aspirin 81 mg daily.   Therapy  recommendations:  pending Disposition:  pending     Hyperlipidemia Home meds:  none  documented,  LDL 88, goal < 70 Add lipitor 40mg   High intensity statin   Continue statin at discharge    SSI  Other Stroke Risk Factors  Advanced Age >/= 74   Hx stroke/TIA   Other Active Problems  Metabolic encephalopathy-likely at baseline, patient is oriented to self only.  We will follow results of MRI brain. Community-acquired pneumonia-concern for pneumonia on CT chest, patient has been started on vancomycin and cefepime.  Follow blood culture results. Probable CAUTI-patient has abnormal UA, she has a Foley catheter in place for recurrent urinary retention.  Urine culture has been obtained.  We will follow the results of culture. Nutrition-patient has PEG tube in place, will continue Osmolite at 65 mill per hour.  125 mL water every 4 hours via tube. Cervical epidural abscess-s/p C5-C6 ACDF on 8/7, cultures were positive for MSSA, patient was started on cefazolin and rifampin till 08/11/2021.  We will hold cefazolin and rifampin at this time as patient has been started on vancomycin and cefepime as above. T12/L1 dystrophic changes-CT abdomen/pelvis showed T12/L1 dystrophic changes, MRI lumbar spine ordered.  We will follow the results.    Hospital day # 1     To contact Stroke Continuity provider, please refer to 10/11/2021. After hours, contact General Neurology

## 2021-07-21 NOTE — ED Notes (Signed)
Patient to CT.

## 2021-07-21 NOTE — ED Notes (Signed)
Patient transported to MRI 

## 2021-07-21 NOTE — Evaluation (Addendum)
Clinical/Bedside Swallow Evaluation Patient Details  Name: Kristy [name] MRN: [id number] Date of Birth: [birth year]  Today's Date: 07/21/2021 Time: SLP Start Time (ACUTE ONLY): 1130 SLP Stop Time (ACUTE ONLY): 1145 SLP Time Calculation (min) (ACUTE ONLY): 15 min  Past Medical History: History reviewed. No pertinent past medical history. Past Surgical History:  Past Surgical History:  Procedure Laterality Date   ANTERIOR CERVICAL DECOMP/DISCECTOMY FUSION N/A 06/16/2021   Procedure: ANTERIOR CERVICAL DISCECTOMY FUSION Cervical five -Cervical six, Evacuation of Epidural Abscess;  Surgeon: Jadene Pierini, MD;  Location: [facility] OR;  Service: Neurosurgery;  Laterality: N/A;   IR GASTROSTOMY TUBE MOD SED  07/02/2021   HPI:  Kristy [name]  is a 75 y.o. 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  As per daughter when she visited patient today she was more short of breath and found her to be more confused than usual.  Patient was brought to the ED for further evaluation.  Initially daughter wanted her to be transferred to [facility] however ED provider called [facility] and was told that she may have to wait for many days , since there was no urgent need for transfer except family request.  In the ED imaging, CT head showed acute/subacute possible pontine infarct.  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  Chest CT was showing Small bilateral  pleural effusions.  2. Small amount of airspace consolidation versus atelectasis in the bilateral lung bases.  Patient is well known to ST services from previous admission.  Decreased alertness and mentation appeared to be her biggest risk factors for aspiration.  During that admission she recieved a PEG.   Assessment / Plan / Recommendation Clinical Impression  Clinical swallowing evaluation was completed using thin liquids via spoon and straw, pureed material and dual textured solids.  The patient is well known to ST from recent admission.  During that admission she recieved a PEG tube due to issues swallowing due to what appeared to be reduced mentation and alertness.  MBS was completed on 06/24/2021 with aspiration seen given a cough response.  It was felt that lethargy possibly played a role.  This date patient was awake and alert.  She was at least oriented to self being able to state her name and DOB.  Due to ongoing back pain bed was not able to be elevated optimally and she was partially reclined.  She kept her head in a postion which allowed for a chin up position during swallowing despite cues and attempts to get her head in a more neutrall position.  Cranial nerve exam was completed and unremarkable with the exception of facial sensation. Lingual, labial, facial and jaw range of motion and strength appeared to be adequate.  Facial senstation appeared to be intact.  However, she reported reduced sensation in her left forehead.  She presented with an oral dysphagia characterized by slow mastication of dual textured solids with oral residue.  She did not appear sensate to this residue.  Use of liquid wash did assist with  clearance.  Swallow trigger was appreciated to palpation and overt s/s of aspiration were not seen even given serial sips of thin liquids via straw.   Will initiate a dysphagia 1 diet with thin liquids.  ST will follow for therapeutic diet tolerance and possible diet advancement. SLP  Visit Diagnosis: Dysphagia, oral phase (R13.11)    Aspiration Risk  Mild aspiration risk    Diet Recommendation    Dysphagia 1 with thin liquids Medication Administration: Via alternative means    Other  Recommendations Oral Care Recommendations: Oral care QID;Staff/trained caregiver to provide oral care Other Recommendations: Have oral suction available   Follow up Recommendations Skilled Nursing facility      Frequency and Duration min 2x/week  2 weeks       Prognosis Prognosis for Safe Diet Advancement: Good      Swallow Study   General Date of Onset: 07/20/21 HPI: Kristy [name]  is a 75 y.o. 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  As per daughter when she visited patient today she was more short of breath and found her to be more confused than usual.  Patient was brought to the ED for further evaluation.  Initially daughter wanted her to be transferred to [facility] however ED provider called [facility] and was told that she may have to wait for many days , since there was no urgent need for transfer except family request.  In the ED imaging, CT head showed acute/subacute possible pontine infarct.  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  Patient is well known to ST services from previous admission.  Decreased alertness and mentation appeared to be her biggest risk factors for aspiration.  During that admission she  recieved a PEG. Type of Study: Bedside Swallow Evaluation Previous Swallow Assessment: bedside swallow eval 8/6 and 8/12 Diet Prior to this Study: PEG tube Temperature Spikes Noted: No Respiratory Status: Room air History of Recent Intubation: No Behavior/Cognition: Alert;Agitated Oral Cavity Assessment: Other (comment) (mild white coating noted on her tongue) Oral Care Completed by SLP: No Oral Cavity - Dentition: Missing dentition (Pt with no upper, with a few lower teeth.) Vision: Impaired for self-feeding Self-Feeding Abilities: Total assist Patient Positioning: Partially reclined Baseline Vocal Quality: Normal Volitional Swallow: Able to elicit    Oral/Motor/Sensory Function Overall Oral Motor/Sensory Function: Within functional limits   Ice Chips Ice chips: Not tested   Thin Liquid Thin Liquid: Within functional limits Presentation: Spoon;Straw    Nectar Thick Nectar Thick Liquid: Not tested   Honey Thick Honey Thick Liquid: Not tested   Puree Puree: Within functional limits Presentation: Spoon   Solid     Solid: Impaired Presentation: Spoon Oral Phase Impairments: Impaired mastication;Poor awareness of bolus Oral Phase Functional Implications: Oral residue;Prolonged oral transit      Dimas Aguas, MA, CCC-SLP Acute Rehab SLP [phone]  Fleet Contras 07/21/2021,12:03 PM

## 2021-07-21 NOTE — Consult Note (Addendum)
Neurology Consultation  Reason for Consult: Stroke Referring Physician: Dr. Drusilla Kanner  CC: Generalized weakness  History is obtained from: Chart review, patient  HPI: Kristy [name] is a 75 y.o. female past medical history of hypertension, polycythemia, alcohol use disorder, recent cervical epidural abscess C2-T3 T4 status post ACDF on 06/16/2021 by neurosurgery with cultures positive for MSSA, hospital course complicated by dysphagia and encephalopathy, recurrent UTI, PEG tube placement, brought from a facility where the family when visiting the patient and found her to be more short of breath and more confused than usual.  History is obtained from the chart as patient is unable to provide reliable history.  Presumably, the daughter went to see the patient and she was not acting like her self.  She was also weak all over and all 4 extremities, which prompted call to EMS who brought her to the ER for further evaluation.  Unclear last known well. Patient was not able to provide much meaningful history by herself.  No family was present at the time of this encounter.  She was admitted in August after sustaining multiple falls.  She was noted to be very encephalopathic.  She was evaluated by neurology and plan was for a lumbar puncture given nuchal rigidity and confusion.  Further imaging revealed that she had a spinal epidural abscess which required emergent operative treatment by neurosurgery.  She had a prolonged hospital course complicated by dysphagia, encephalopathy, recurrent UTIs.  Eventually placed on PEG tube and transferred to a SNF.  At baseline encephalopathic to the point where she is only oriented to self according to chart review.  LKW: Unclear tpa given?: no, unclear last normal Premorbid modified Rankin scale (mRS): 4-5 based on the description provided by care team  ROS: .  Unable to obtain due to altered mental status.   History reviewed. No pertinent past medical history. As  documented in the HPI  No family history on file. Unable to ascertain due to patient's poor mentation  Social History:   has no history on file for tobacco use, alcohol use, and drug use. Has a history of alcohol abuse  Medications  Current Facility-Administered Medications:    0.9 %  sodium chloride infusion, , Intravenous, Continuous, Sharl Ma, Sarina Ill, MD, Last Rate: 75 mL/hr at 07/21/21 0252, New Bag at 07/21/21 0252   acetaminophen (TYLENOL) 160 MG/5ML solution 650 mg, 650 mg, Per Tube, Q6H, Sharl Ma, Sarina Ill, MD   acetaminophen (TYLENOL) tablet 650 mg, 650 mg, Per Tube, Q6H PRN **OR** acetaminophen (TYLENOL) suppository 650 mg, 650 mg, Rectal, Q6H PRN, Sharl Ma, Sarina Ill, MD   allopurinol (ZYLOPRIM) tablet 100 mg, 100 mg, Per Tube, Daily, Lama, Sarina Ill, MD   ceFEPIme (MAXIPIME) 2 g in sodium chloride 0.9 % 100 mL IVPB, 2 g, Intravenous, Q8H, Sharl Ma, Sarina Ill, MD, Stopped at 07/20/21 2225   diclofenac Sodium (VOLTAREN) 1 % topical gel 4 g, 4 g, Topical, TID AC & HS, Lama, Sarina Ill, MD   feeding supplement (OSMOLITE 1.2 CAL) liquid 1,000 mL, 1,000 mL, Per Tube, Continuous, Lama, Sarina Ill, MD   feeding supplement (PROSource TF) liquid 45 mL, 45 mL, Per Tube, BID, Lama, Sarina Ill, MD   free water 125 mL, 125 mL, Per Tube, Q4H, Sharl Ma, Sarina Ill, MD   hydrOXYzine (ATARAX/VISTARIL) tablet 25 mg, 25 mg, Per Tube, Daily PRN, Sharl Ma, Sarina Ill, MD   midodrine (PROAMATINE) tablet 5 mg, 5 mg, Per Tube, TID WC, Sharl Ma, Sarina Ill, MD   ondansetron [facility])  tablet 4 mg, 4 mg, Per Tube, Q6H PRN **OR** ondansetron (ZOFRAN) injection 4 mg, 4 mg, Intravenous, Q6H PRN, Sharl MaLama, Sarina IllGagan S, MD   oxyCODONE (Oxy IR/ROXICODONE) immediate release tablet 5 mg, 5 mg, Per Tube, Q6H PRN, Sharl MaLama, Sarina IllGagan S, MD   thiamine tablet 100 mg, 100 mg, Per Tube, Daily, Sharl MaLama, Sarina IllGagan S, MD   vancomycin (VANCOCIN) IVPB 1000 mg/200 mL premix, 1,000 mg, Intravenous, Q12H, Sharl MaLama, Sarina IllGagan S, MD  Current Outpatient Medications:    acetaminophen (TYLENOL) 160 MG/5ML  solution, Place 20.3 mLs (650 mg total) into feeding tube every 6 (six) hours., Disp: 120 mL, Rfl: 0   allopurinol (ZYLOPRIM) 100 MG tablet, Take 100 mg by mouth daily., Disp: , Rfl:    bisacodyl (DULCOLAX) 10 MG suppository, Place 10 mg rectally as needed for moderate constipation., Disp: , Rfl:    ceFAZolin (ANCEF) 2-4 GM/100ML-% IVPB, Inject 100 mLs (2 g total) into the vein every 8 (eight) hours., Disp: 1 each, Rfl:    diclofenac Sodium (VOLTAREN) 1 % GEL, Apply 4 g topically 4 (four) times daily. To neck, Disp: , Rfl:    folic acid (FOLVITE) 1 MG tablet, Place 1 tablet (1 mg total) into feeding tube daily., Disp: , Rfl:    hydrOXYzine (ATARAX/VISTARIL) 25 MG tablet, Take 25 mg by mouth daily as needed for itching., Disp: , Rfl:    Magnesium Hydroxide (MILK OF MAGNESIA PO), Take 30 mLs by mouth daily as needed (constipation)., Disp: , Rfl:    melatonin 3 MG TABS tablet, Place 1 tablet (3 mg total) into feeding tube at bedtime as needed. (Patient taking differently: Place 3 mg into feeding tube at bedtime as needed (sleep).), Disp: , Rfl: 0   midodrine (PROAMATINE) 5 MG tablet, Place 1 tablet (5 mg total) into feeding tube 3 (three) times daily with meals., Disp: , Rfl:    Multiple Vitamin (MULTIVITAMIN) LIQD, Place 15 mLs into feeding tube daily., Disp: , Rfl:    niacin 500 MG tablet, Place 1 tablet (500 mg total) into feeding tube at bedtime., Disp: , Rfl:    Nutritional Supplements (FEEDING SUPPLEMENT, OSMOLITE 1.2 CAL,) LIQD, Place 1,000 mLs into feeding tube continuous. (Patient taking differently: Place 1,000 mLs into feeding tube continuous. 65 ml/hr Continuously with 125 ml water flushed every 4 hours via g-tube), Disp: , Rfl: 0   Nutritional Supplements (FEEDING SUPPLEMENT, PROSOURCE TF,) liquid, Place 45 mLs into feeding tube 2 (two) times daily., Disp: , Rfl:    oxyCODONE (OXY IR/ROXICODONE) 5 MG immediate release tablet, Take 1 tablet (5 mg total) by mouth every 6 (six) hours as needed  for breakthrough pain or severe pain. (Patient taking differently: Take 5 mg by mouth every 8 (eight) hours as needed for breakthrough pain or severe pain.), Disp: 30 tablet, Rfl: 0   polyethylene glycol (MIRALAX / GLYCOLAX) 17 g packet, Place 17 g into feeding tube daily as needed for mild constipation or severe constipation., Disp: 14 each, Rfl: 0   rifampin (RIFADIN) 300 MG capsule, Take 1 capsule (300 mg total) by mouth every 12 (twelve) hours., Disp: , Rfl:    Sodium Phosphates (RA SALINE ENEMA RE), Place 1 Bottle rectally as needed (constipation)., Disp: , Rfl:    thiamine 100 MG tablet, Place 1 tablet (100 mg total) into feeding tube daily., Disp: , Rfl:    Water For Irrigation, Sterile (FREE WATER) SOLN, Place 125 mLs into feeding tube every 4 (four) hours., Disp: , Rfl:    Exam: Current vital  signs: BP (!) 142/71   Pulse (!) 115   Temp 98.7 F (37.1 C) (Oral)   Resp (!) 29   SpO2 100%  Vital signs in last 24 hours: Temp:  [98.7 F (37.1 C)-99.6 F (37.6 C)] 98.7 F (37.1 C) (09/11 0256) Pulse Rate:  [99-117] 115 (09/11 0300) Resp:  [17-32] 29 (09/11 0300) BP: (102-171)/(61-114) 142/71 (09/11 0300) SpO2:  [94 %-100 %] 100 % (09/11 0300) General: Awake alert in no distress HEENT: Normocephalic/atraumatic Lungs: Clear Cardiovascular: Regular rate rhythm Abdomen nondistended nontender, PEG in place. Extremities: Crusting on her feet with changes of chronic venous stasis up until mid calf. Neurological exam Awake alert oriented to self Able to name objects.  Mildly dysarthric.  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  Palate is symmetric but the elevation is incomplete.  Tongue is midline. Motor exam: She is able to  move all 4 extremities but is barely 3/5 in all 4 extremities.  Even with a lot of coaching she barely could be antigravity so I would rate her strength in all extremities proximally to be 2/5. Sensation: Intact to touch Coordination-cannot assess due to the above NIH stroke scale 1a Level of Conscious.: 0 1b LOC Questions: 1 1c LOC Commands: 0 2 Best Gaze: 1 3 Visual: 0 4 Facial Palsy: 0 5a Motor Arm - left: 2 5b Motor Arm - Right: 2 6a Motor Leg - Left: 2 6b Motor Leg - Right: 2 7 Limb Ataxia: 0 8 Sensory: 0 9 Best Language: 0 10 Dysarthria: 1 11 Extinct. and Inatten.: 0 TOTAL: 11  Labs I have reviewed labs in epic and the results pertinent to this consultation are:  CBC    Component Value Date/Time   WBC 12.5 (H) 07/20/2021 1754   RBC 2.59 (L) 07/20/2021 1754   HGB 8.2 (L) 07/20/2021 1820   HCT 24.0 (L) 07/20/2021 1820   PLT 212 07/20/2021 1754   MCV 100.4 (H) 07/20/2021 1754   MCH 30.9 07/20/2021 1754   MCHC 30.8 07/20/2021 1754   RDW 16.6 (H) 07/20/2021 1754   LYMPHSABS 0.9 07/20/2021 1754   MONOABS 0.8 07/20/2021 1754   EOSABS 1.8 (H) 07/20/2021 1754   BASOSABS 0.0 07/20/2021 1754    CMP     Component Value Date/Time   NA 140 07/20/2021 1820   K 4.2 07/20/2021 1820   CL 105 07/20/2021 1820   CO2 23 07/20/2021 1754   GLUCOSE 80 07/20/2021 1820   BUN 24 (H) 07/20/2021 1820   CREATININE 0.60 07/20/2021 1820   CALCIUM 8.7 (L) 07/20/2021 1754   PROT 7.4 07/20/2021 1754   ALBUMIN 1.6 (L) 07/20/2021 1754   AST 17 07/20/2021 1754   ALT 5 07/20/2021 1754   ALKPHOS 141 (H) 07/20/2021 1754   BILITOT 0.8 07/20/2021 1754   GFRNONAA >60 07/20/2021 1754   GFRAA 47 (L) 12/29/2015 1631  Urinalysis nitrite positive, moderate leukocyte esterase, 21-50 WBCs.  2D echo August 2022 unremarkable.  Imaging I have reviewed the images obtained:  CT-head-question acute to subacute pontine infarct likely new from the CT of August  MRI examination of the brain-mid  pontine stroke-acute to subacute.   MR cervical spine with and without contrast: Sequela of interval ACDF in comparison to the prior MRI at  C5-6.  Evidence of persistent and/or recurrent osteomyelitis discitis at C5-6, C6-7, C7-T1 and likely T1 and 2.  Associated ventral epidural phlegmon/abscess extending from C4-C6 7 with up to moderate spinal stenosis at these levels with no visible cord signal changes-motion degraded exam. Associated prevertebral and posterior paraspinous edema without visible discrete soft tissue abscess.  Evolving acute to subacute pontine infarct better evaluated on the brain MRI.   CT chest w/contrast IMPRESSION: 1. Small bilateral pleural effusions. 2. Small amount of airspace consolidation versus atelectasis in the bilateral lung bases. 3. Interval development of dystrophic changes involving the inferior endplate of T12 vertebral body and superior endplate of L1 vertebral body. There is an associated narrowing of the intervening intervertebral disc and small amount of soft tissue thickening anteriorly. This finding is new when compared to patient's prior CT dated June 27, 2021, and therefore may represent sequela of discitis/osteomyelitis. Differential diagnosis includes asymmetric discogenic osteoarthritic endplate changes. Please correlate to patient's symptoms. If further imaging evaluation is desired, contrast enhanced MRI of the lumbar spine may be considered. 4. Gastrostomy tube appropriately positioned within the stomach without significant surrounding inflammatory changes. 5. Bilateral too small to be actually characterized hypoattenuated masses in both kidneys.  Assessment:  75 year old with above past medical history presenting for evaluation of confusion and generalized weakness. On examination she has significant weakness in all 4 extremities along with significant dysarthria and dysphagia-the dysarthria and dysphagia I am presuming was ongoing  before as well-but she required the PEG tube 4. This brainstem stroke-is either small vessel in etiology or due to diseased posterior circulation.  Needs complete stroke work-up.  Given the osteomyelitis, recent surgery with the spinal epidural abscess, possibility of septic emboli should also be entertained.  Impression: -Acute ischemic stroke-septic emboli, versus, cardioembolic versus large vessel etiology -Recent epidural spinal abscess status post ACDF, currently on antibiotics - possible thoracolumbar discitis/osteomyelitis  -Cervical osteomyelitis -Multifactorial toxic metabolic encephalopathy due to recent osteomyelitis/abscess as well as recurrent UTIs -Possible current UTI  Recommendations: -Admit to hospitalist -Frequent neurochecks -Telemetry -I would hold off on anticoagulation or weakness given the risk of mycotic aneurysms should the stroke be from septic emboli. -CT angiogram-evaluate large vessels as well as to evaluate for any evidence of mycotic aneurysms. -Repeat 2D echo-might need TEE -A1c -Lipid panel -PT -OT -Speech therapy -Management of toxic metabolic derangements per primary team -Consider touching base with neurosurgery regarding the C-spine MRI findings. -Continue management of the epidural abscess/discitis osteomyelitis per primary team. Plan discussed with Dr. Rachael Darby via securechat  Stroke team will follow with you.  -- Milon Dikes, MD Neurologist [facility] Pager: [phone]

## 2021-07-21 NOTE — Consult Note (Addendum)
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 Maurice Smallstergard that one could consider IR guided aspirate for culture though I suspect this is all bad destructive pathology from MSSA Follow-up blood cultures  New ID team to take over the service tomorrow.  Active Problems:   Generalized weakness   Cerebral thrombosis with cerebral infarction   Cerebral embolism with cerebral infarction   Subarachnoid hemorrhage   Intracerebral hemorrhage   Scheduled Meds:   stroke: mapping our early stages of recovery book   Does not apply Once   acetaminophen  650 mg Per Tube Q6H   allopurinol  100 mg Per Tube Daily   diclofenac Sodium  4 g Topical TID AC & HS   [START ON 07/22/2021] feeding supplement (OSMOLITE 1.2 CAL)  1,000 mL Per Tube Q24H   feeding supplement (PROSource TF)  45 mL Per Tube BID   free water  125 mL Per Tube Q4H   midodrine  5 mg Per Tube TID WC   thiamine  100 mg Per Tube Daily   Continuous Infusions:  sodium chloride 75 mL/hr at 07/21/21 1201   ceFEPime (MAXIPIME) IV Stopped (07/21/21  [id number])   vancomycin 1,000 mg (07/21/21 1250)   PRN Meds:.acetaminophen **OR** acetaminophen, hydrOXYzine, ondansetron **OR** ondansetron (ZOFRAN) IV, oxyCODONE  HPI: Kristy [name] is a 75 y.o. female polycythemia alcohol abuse who was admitted with an epidural abscess from C2 to T3-4 due to methicillin sensitive Staph aureus status post anterior cervical discectomy and instrumentation on August 7.  She was treated initially with nafcillin through August 21 and then changed to cefazolin along with rifampin with plans of completing 8 weeks of therapy.  She has been admitted to the hospital now with acute onset of confusion and bilateral upper extremity weakness.  She has had an MRI of the cervical and lumbar spine which shows new changes consistent with discitis at C6-7 and C7-T1 T1-T2 with an epidural abscess, along with discitis and osteomyelitis at T12-L1 with a small abscess along the right margin of T12-L1 with acute septic arthritis of the right SI joint and a 2.2 x 1.4 cm soft tissue abscess.  MRI of the brain shows an acute ischemic pontine infarct.   2D echocardiogram performed today does not show evidence of endocarditis.  She never had a transesophageal echocardiogram but I doubt she would be a candidate for this given her recent C-spine surgery.  We can certainly switch her back to nafcillin with rifampin for better CNS penetration of her antibiotics though it is not clear to  me that her infarct is due to a septic embolism.  I 84 minutes with the patient including than 50% of the time in face to face counseling of the patient personally reviewing her MRI brain and C-spine L-spine chest x-ray admission CBC and BMP echocardiogram along with review of medical records in preparation for the visit and during the visit and in coordination of her care.    Review of Systems: Review of Systems  Unable to perform ROS: Mental status change   History reviewed. No pertinent past medical  history.  Social History   Tobacco Use   Smoking status: Unknown    No family history on file. No Known Allergies  OBJECTIVE: Blood pressure (!) 164/79, pulse (!) 105, temperature 98.7 F (37.1 C), temperature source Oral, resp. rate (!) 24, SpO2 100 %.  Physical Exam Constitutional:      General: She is not in acute distress.    Appearance: She is well-developed. She is not ill-appearing or diaphoretic.  HENT:     Head: Normocephalic and atraumatic.     Right Ear: Hearing and external ear normal.     Left Ear: Hearing and external ear normal.     Nose: No nasal deformity or rhinorrhea.  Eyes:     General: No scleral icterus.    Extraocular Movements: Extraocular movements intact.     Conjunctiva/sclera: Conjunctivae normal.     Right eye: Right conjunctiva is not injected.     Left eye: Left conjunctiva is not injected.     Comments: Some exudate over the left eyelid  Neck:     Vascular: No JVD.  Cardiovascular:     Rate and Rhythm: Normal rate and regular rhythm.     Heart sounds: Normal heart sounds, S1 normal and S2 normal. No murmur heard.   No friction rub. No gallop.  Pulmonary:     Effort: No respiratory distress.     Breath sounds: No stridor. No wheezing or rhonchi.  Abdominal:     General: Bowel sounds are normal. There is no distension.     Palpations: Abdomen is soft. There is no mass.     Tenderness: There is no abdominal tenderness.     Hernia: No hernia is present.  Musculoskeletal:        General: Normal range of motion.     Right shoulder: Normal.     Left shoulder: Normal.     Cervical back: Normal range of motion and neck supple.     Right hip: Normal.     Left hip: Normal.     Right knee: Normal.     Left knee: Normal.  Lymphadenopathy:     Head:     Right side of head: No submandibular, preauricular or posterior auricular adenopathy.     Left side of head: No submandibular, preauricular or posterior auricular adenopathy.     Cervical: No  cervical adenopathy.     Right cervical: No superficial or deep cervical adenopathy.    Left cervical: No superficial or deep cervical adenopathy.  Skin:    General: Skin is warm and dry.     Coloration: Skin is not pale.     Findings: No abrasion, bruising, ecchymosis, erythema, lesion or rash.     Nails: There is no clubbing.  Neurological:     Mental Status: She is disoriented.     Sensory: No sensory deficit.     Coordination: Coordination normal.     Gait: Gait normal.  Psychiatric:  Attention and Perception: She is attentive.        Mood and Affect: Mood is anxious.        Speech: Speech is delayed.        Behavior: Behavior is cooperative.        Cognition and Memory: Cognition is impaired. Memory is impaired. She exhibits impaired recent memory and impaired remote memory.   Her upper extremity strength is greater in her right upper extremity versus left but strength is at least 4 out of 5 bilaterally extremity strength intact  I tried to examine her cervical spine surgery site I did not see evidence of fluctuance or infection.  Dry skin with lots of hyperkeratosis lower extremity skin more highly pigmented  07/21/2021:        No focal ulcer seen Lab Results Lab Results  Component Value Date   WBC 12.6 (H) 07/21/2021   HGB 7.9 (L) 07/21/2021   HCT 25.2 (L) 07/21/2021   MCV 99.6 07/21/2021   PLT 204 07/21/2021    Lab Results  Component Value Date   CREATININE 0.78 07/21/2021   BUN 21 07/21/2021   NA 135 07/21/2021   K 3.8 07/21/2021   CL 104 07/21/2021   CO2 21 (L) 07/21/2021    Lab Results  Component Value Date   ALT 5 07/20/2021   AST 17 07/20/2021   ALKPHOS 141 (H) 07/20/2021   BILITOT 0.8 07/20/2021     Microbiology: Recent Results (from the past 240 hour(s))  Resp Panel by RT-PCR (Flu A&B, Covid) Nasopharyngeal Swab     Status: None   Collection Time: 07/20/21  5:20 PM   Specimen: Nasopharyngeal Swab; Nasopharyngeal(NP) swabs in vial  transport medium  Result Value Ref Range Status   SARS Coronavirus 2 by RT PCR NEGATIVE NEGATIVE Final    Comment: (NOTE) SARS-CoV-2 target nucleic acids are NOT DETECTED.  The SARS-CoV-2 RNA is generally detectable in upper respiratory specimens during the acute phase of infection. The lowest concentration of SARS-CoV-2 viral copies this assay can detect is 138 copies/mL. A negative result does not preclude SARS-Cov-2 infection and should not be used as the sole basis for treatment or other patient management decisions. A negative result may occur with  improper specimen collection/handling, submission of specimen other than nasopharyngeal swab, presence of viral mutation(s) within the areas targeted by this assay, and inadequate number of viral copies(<138 copies/mL). A negative result must be combined with clinical observations, patient history, and epidemiological information. The expected result is Negative.  Fact Sheet for Patients:  BloggerCourse.com  Fact Sheet for Healthcare Providers:  SeriousBroker.it  This test is no t yet approved or cleared by the [place] FDA and  has been authorized for detection and/or diagnosis of SARS-CoV-2 by FDA under an Emergency Use Authorization (EUA). This EUA will remain  in effect (meaning this test can be used) for the duration of the COVID-19 declaration under Section 564(b)(1) of the Act, 21 U.S.C.section 360bbb-3(b)(1), unless the authorization is terminated  or revoked sooner.       Influenza A by PCR NEGATIVE NEGATIVE Final   Influenza B by PCR NEGATIVE NEGATIVE Final    Comment: (NOTE) The Xpert Xpress SARS-CoV-2/FLU/RSV plus assay is intended as an aid in the diagnosis of influenza from Nasopharyngeal swab specimens and should not be used as a sole basis for treatment. Nasal washings and aspirates are unacceptable for Xpert Xpress SARS-CoV-2/FLU/RSV testing.  Fact  Sheet for Patients: BloggerCourse.com  Fact Sheet for Healthcare Providers:  SeriousBroker.it  This test is not yet approved or cleared by the [place] and has been authorized for detection and/or diagnosis of SARS-CoV-2 by FDA under an Emergency Use Authorization (EUA). This EUA will remain in effect (meaning this test can be used) for the duration of the COVID-19 declaration under Section 564(b)(1) of the Act, 21 U.S.C. section 360bbb-3(b)(1), unless the authorization is terminated or revoked.  Performed at [facility], [address], [place], [place] [address]   MRSA Next Gen by PCR, Nasal     Status: None   Collection Time: 07/20/21  9:44 PM   Specimen: Nasal Mucosa; Nasal Swab  Result Value Ref Range Status   MRSA by PCR Next Gen NOT DETECTED NOT DETECTED Final    Comment: (NOTE) The GeneXpert MRSA Assay (FDA approved for NASAL specimens only), is one component of a comprehensive MRSA colonization surveillance program. It is not intended to diagnose MRSA infection nor to guide or monitor treatment for MRSA infections. Test performance is not FDA approved in patients less than 39 years old. Performed at [facility], [address] [place], [place] [address]     Acey Lav, MD [facility] for Infectious Disease [facility] [phone] pager  07/21/2021, 1:11 PM

## 2021-07-21 NOTE — Progress Notes (Signed)
Pt received by RN, vitals WNL, blood sugar 69 this RN gave 4 oz juice, will retest in 15 min. Tele on, IV assessed and flushed. Meds to be given. Patient resting comfortably.

## 2021-07-21 NOTE — H&P (Addendum)
TRH H&P    Patient Demographics:    Kristy [name], is a 75 y.o. female  MRN: [id number]  DOB - [birth year]  Admit Date - 07/20/2021  Referring MD/NP/PA: Dr. Criss Alvine  Outpatient Primary MD for the patient is Docia Chuck, Dibas, MD  Patient coming from: Skilled nursing facility  Chief complaint-altered mental status   HPI:    Kristy [name]  is a 75 y.o. 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  As per daughter when she visited patient today she was more short of breath and found her to be more confused than usual.  Patient was brought to the ED for further evaluation.  Initially daughter wanted her to be transferred to [facility] however ED provider called [facility] and was told that she may have to wait for many days , since there was no urgent need for transfer except family request.  In the ED imaging, CT head showed acute/subacute possible pontine infarct.  MRI brain was ordered.  CT abdomen/pelvis also showed dystrophic changes involving the inferior endplate of T12 and L1 vertebral body this was associated with narrowing of the intervening intervertebral disc and small amount of soft tissue thickening anteriorly.  This finding was new as compared to prior CTs and may represent sequela of discitis/osteomyelitis.  MRI lumbar spine was ordered.  Lab work showed lactic acid 1.3, BNP 169.1, WBC 12.5.  CT chest showed small amount of airspace consolidation versus atelectasis in the  bilateral lung bases. Patient was started on vancomycin and cefepime.  Patient unable to provide significant history Though denies abdominal pain, denies chest pain or shortness of breath.    Review of systems:    In addition to the HPI above,    All other systems reviewed and are negative.    Past History of the following :    History reviewed. No pertinent past medical history.    Past Surgical History:  Procedure Laterality Date   ANTERIOR CERVICAL DECOMP/DISCECTOMY FUSION N/A 06/16/2021   Procedure: ANTERIOR CERVICAL DISCECTOMY FUSION Cervical five -Cervical six, Evacuation of Epidural Abscess;  Surgeon: Jadene Pierini, MD;  Location: [facility] OR;  Service: Neurosurgery;  Laterality: N/A;   IR GASTROSTOMY TUBE MOD SED  07/02/2021      Social History:      Social History   Tobacco Use   Smoking status: Unknown   Smokeless tobacco: Not on file  Substance Use Topics   Alcohol use: Not on file       Family History :   Unable to obtain as patient has altered mental status   Home Medications:   Prior to Admission medications   Medication Sig Start Date End Date Taking?  Authorizing Provider  acetaminophen (TYLENOL) 160 MG/5ML solution Place 20.3 mLs (650 mg total) into feeding tube every 6 (six) hours. 07/10/21  Yes Russella Dar, NP  allopurinol (ZYLOPRIM) 100 MG tablet Take 100 mg by mouth daily.   Yes [provider]  bisacodyl (DULCOLAX) 10 MG suppository Place 10 mg rectally as needed for moderate constipation.   Yes [provider]  ceFAZolin (ANCEF) 2-4 GM/100ML-% IVPB Inject 100 mLs (2 g total) into the vein every 8 (eight) hours. 07/10/21  Yes Russella Dar, NP  diclofenac Sodium (VOLTAREN) 1 % GEL Apply 4 g topically 4 (four) times daily. To neck 07/10/21  Yes Russella Dar, NP  folic acid (FOLVITE) 1 MG tablet Place 1 tablet (1 mg total) into feeding tube daily. 07/11/21  Yes Russella Dar, NP  hydrOXYzine (ATARAX/VISTARIL) 25 MG  tablet Take 25 mg by mouth daily as needed for itching.   Yes [provider]  Magnesium Hydroxide (MILK OF MAGNESIA PO) Take 30 mLs by mouth daily as needed (constipation).   Yes [provider]  melatonin 3 MG TABS tablet Place 1 tablet (3 mg total) into feeding tube at bedtime as needed. Patient taking differently: Place 3 mg into feeding tube at bedtime as needed (sleep). 07/10/21  Yes Russella Dar, NP  midodrine (PROAMATINE) 5 MG tablet Place 1 tablet (5 mg total) into feeding tube 3 (three) times daily with meals. 07/10/21  Yes Russella Dar, NP  Multiple Vitamin (MULTIVITAMIN) LIQD Place 15 mLs into feeding tube daily. 07/11/21  Yes Russella Dar, NP  niacin 500 MG tablet Place 1 tablet (500 mg total) into feeding tube at bedtime. 07/10/21  Yes Russella Dar, NP  Nutritional Supplements (FEEDING SUPPLEMENT, OSMOLITE 1.2 CAL,) LIQD Place 1,000 mLs into feeding tube continuous. Patient taking differently: Place 1,000 mLs into feeding tube continuous. 65 ml/hr Continuously with 125 ml water flushed every 4 hours via g-tube 07/10/21  Yes Russella Dar, NP  Nutritional Supplements (FEEDING SUPPLEMENT, PROSOURCE TF,) liquid Place 45 mLs into feeding tube 2 (two) times daily. 07/11/21  Yes Russella Dar, NP  oxyCODONE (OXY IR/ROXICODONE) 5 MG immediate release tablet Take 1 tablet (5 mg total) by mouth every 6 (six) hours as needed for breakthrough pain or severe pain. Patient taking differently: Take 5 mg by mouth every 8 (eight) hours as needed for breakthrough pain or severe pain. 07/10/21  Yes Russella Dar, NP  polyethylene glycol (MIRALAX / GLYCOLAX) 17 g packet Place 17 g into feeding tube daily as needed for mild constipation or severe constipation. 07/10/21  Yes Russella Dar, NP  rifampin (RIFADIN) 300 MG capsule Take 1 capsule (300 mg total) by mouth every 12 (twelve) hours. 07/10/21  Yes Russella Dar, NP  Sodium Phosphates (RA SALINE ENEMA RE) Place 1  Bottle rectally as needed (constipation).   Yes [provider]  thiamine 100 MG tablet Place 1 tablet (100 mg total) into feeding tube daily. 07/11/21  Yes Russella Dar, NP  Water For Irrigation, Sterile (FREE WATER) SOLN Place 125 mLs into feeding tube every 4 (four) hours. 07/10/21  Yes Russella Dar, NP     Allergies:    No Known Allergies   Physical Exam:   Vitals  Blood pressure 102/90, pulse (!) 113, temperature 99.2 F (37.3 C), temperature source Oral, resp. rate 17, SpO2 98 %.  1.  General: Appears in no acute distress  2. Psychiatric: Alert, oriented  to self only  3. Neurologic: Cranial nerves II through XII grossly intact, motor strength 2/5 in upper extremities, sensations intact bilaterally, moving lower extremities  4. HEENMT:  Atraumatic normocephalic, extraocular muscles are intact  5. Respiratory : Clear to auscultation bilaterally  6. Cardiovascular : S1-S2, regular, no murmur auscultated  7. Gastrointestinal:  Abdominal soft, nontender, no organomegaly     Data Review:    CBC Recent Labs  Lab 07/20/21 1754 07/20/21 1820  WBC 12.5*  --   HGB 8.0* 8.2*  HCT 26.0* 24.0*  PLT 212  --   MCV 100.4*  --   MCH 30.9  --   MCHC 30.8  --   RDW 16.6*  --   LYMPHSABS 0.9  --   MONOABS 0.8  --   EOSABS 1.8*  --   BASOSABS 0.0  --    ------------------------------------------------------------------------------------------------------------------  Results for orders placed or performed during the hospital encounter of 07/20/21 (from the past 48 hour(s))  Urinalysis, Routine w reflex microscopic     Status: Abnormal   Collection Time: 07/20/21  4:54 PM  Result Value Ref Range   Color, Urine YELLOW YELLOW   APPearance CLEAR CLEAR   Specific Gravity, Urine 1.010 1.005 - 1.030   pH 7.0 5.0 - 8.0   Glucose, UA NEGATIVE NEGATIVE mg/dL   Hgb urine dipstick LARGE (A) NEGATIVE   Bilirubin Urine NEGATIVE NEGATIVE   Ketones, ur NEGATIVE  NEGATIVE mg/dL   Protein, ur NEGATIVE NEGATIVE mg/dL   Nitrite POSITIVE (A) NEGATIVE   Leukocytes,Ua MODERATE (A) NEGATIVE    Comment: Performed at [facility], [address], [place], [place] [address]  Urinalysis, Microscopic (reflex)     Status: Abnormal   Collection Time: 07/20/21  4:54 PM  Result Value Ref Range   RBC / HPF >50 0 - 5 RBC/hpf   WBC, UA 21-50 0 - 5 WBC/hpf   Bacteria, UA MANY (A) NONE SEEN   Squamous Epithelial / LPF NONE SEEN 0 - 5   Mucus PRESENT    Hyaline Casts, UA PRESENT     Comment: Performed at [facility], [address], [place], [place] [address]  Resp Panel by RT-PCR (Flu A&B, Covid) Nasopharyngeal Swab     Status: None   Collection Time: 07/20/21  5:20 PM   Specimen: Nasopharyngeal Swab; Nasopharyngeal(NP) swabs in vial transport medium  Result Value Ref Range   SARS Coronavirus 2 by RT PCR NEGATIVE NEGATIVE    Comment: (NOTE) SARS-CoV-2 target nucleic acids are NOT DETECTED.  The SARS-CoV-2 RNA is generally detectable in upper respiratory specimens during the acute phase of infection. The lowest concentration of SARS-CoV-2 viral copies this assay can detect is 138 copies/mL. A negative result does not preclude SARS-Cov-2 infection and should not be used as the sole basis for treatment or other patient management decisions. A negative result may occur with  improper specimen collection/handling, submission of specimen other than nasopharyngeal swab, presence of viral mutation(s) within the areas targeted by this assay, and inadequate number of viral copies(<138 copies/mL). A negative result must be combined with clinical observations, patient history, and epidemiological information. The expected result is Negative.  Fact Sheet for Patients:  BloggerCourse.com  Fact Sheet for Healthcare Providers:  SeriousBroker.it  This test is no t yet approved or cleared by the [place]  FDA and  has been authorized for detection and/or diagnosis of SARS-CoV-2 by FDA under an Emergency Use Authorization (EUA). This EUA will remain  in effect (meaning this test can be used) for the duration of the COVID-19 declaration under Section 564(b)(1) of the Act, 21 U.S.C.section 360bbb-3(b)(1), unless the authorization is terminated  or revoked sooner.       Influenza A by PCR NEGATIVE NEGATIVE   Influenza B by PCR NEGATIVE NEGATIVE    Comment: (NOTE) The Xpert Xpress SARS-CoV-2/FLU/RSV plus assay is intended as an aid in the diagnosis of influenza from Nasopharyngeal swab specimens and should not be used as a sole basis for treatment. Nasal washings and aspirates are unacceptable for Xpert Xpress SARS-CoV-2/FLU/RSV testing.  Fact Sheet for Patients: BloggerCourse.com  Fact Sheet for Healthcare Providers: SeriousBroker.it  This test is not yet approved or cleared by the [place] FDA and has been authorized for detection and/or diagnosis of SARS-CoV-2 by FDA under an Emergency Use Authorization (EUA). This EUA will remain in effect (meaning this test can be used) for the duration of the COVID-19 declaration under Section 564(b)(1) of the Act, 21 U.S.C. section 360bbb-3(b)(1), unless the authorization is terminated or revoked.  Performed at [facility], [address] [place], [place] [address]   CBC with Differential     Status: Abnormal   Collection Time: 07/20/21  5:54 PM  Result Value Ref Range   WBC 12.5 (H) 4.0 - 10.5 K/uL   RBC 2.59 (L) 3.87 - 5.11 MIL/uL   Hemoglobin 8.0 (L) 12.0 - 15.0 g/dL   HCT 62.9 (L) 52.8 - 41.3 %   MCV 100.4 (H) 80.0 - 100.0 fL   MCH 30.9 26.0 - 34.0 pg   MCHC 30.8 30.0 - 36.0 g/dL   RDW 24.4 (H) 01.0 - 27.2 %   Platelets 212 150 - 400 K/uL   nRBC 0.0 0.0 - 0.2 %   Neutrophils Relative % 71 %   Neutro Abs 8.8 (H) 1.7 - 7.7 K/uL   Lymphocytes Relative 7 %   Lymphs Abs  0.9 0.7 - 4.0 K/uL   Monocytes Relative 7 %   Monocytes Absolute 0.8 0.1 - 1.0 K/uL   Eosinophils Relative 14 %   Eosinophils Absolute 1.8 (H) 0.0 - 0.5 K/uL   Basophils Relative 0 %   Basophils Absolute 0.0 0.0 - 0.1 K/uL   Immature Granulocytes 1 %   Abs Immature Granulocytes 0.09 (H) 0.00 - 0.07 K/uL    Comment: Performed at [facility], [address], [place], [place] [address]  Comprehensive metabolic panel     Status: Abnormal   Collection Time: 07/20/21  5:54 PM  Result Value Ref Range   Sodium 137 135 - 145 mmol/L   Potassium 4.1 3.5 - 5.1 mmol/L   Chloride 105 98 - 111 mmol/L   CO2 23 22 - 32 mmol/L   Glucose, Bld 79 70 - 99 mg/dL    Comment: Glucose reference range applies only to samples taken after fasting for at least 8 hours.   BUN 26 (H) 8 - 23 mg/dL   Creatinine, Ser 4.03 0.44 - 1.00 mg/dL   Calcium 8.7 (L) 8.9 - 10.3 mg/dL   Total Protein 7.4 6.5 - 8.1 g/dL   Albumin 1.6 (L) 3.5 - 5.0 g/dL   AST 17 15 - 41 U/L   ALT 5 0 - 44 U/L   Alkaline Phosphatase 141 (H) 38 - 126 U/L   Total Bilirubin 0.8 0.3 - 1.2 mg/dL   GFR, Estimated >47 >42 mL/min    Comment: (NOTE) Calculated using the CKD-EPI Creatinine Equation (2021)  Anion gap 9 5 - 15    Comment: Performed at [facility], [address] [place], [place] [address]  Lactic acid, plasma     Status: None   Collection Time: 07/20/21  5:54 PM  Result Value Ref Range   Lactic Acid, Venous 1.3 0.5 - 1.9 mmol/L    Comment: Performed at [facility], [address] [place], [place] [address]  I-stat chem 8, ED (not at [facility] or [facility])     Status: Abnormal   Collection Time: 07/20/21  6:20 PM  Result Value Ref Range   Sodium 140 135 - 145 mmol/L   Potassium 4.2 3.5 - 5.1 mmol/L   Chloride 105 98 - 111 mmol/L   BUN 24 (H) 8 - 23 mg/dL   Creatinine, Ser 0.980.60 0.44 - 1.00 mg/dL   Glucose, Bld 80 70 - 99 mg/dL    Comment: Glucose reference range applies only to samples taken after fasting for at  least 8 hours.   Calcium, Ion 1.15 1.15 - 1.40 mmol/L   TCO2 25 22 - 32 mmol/L   Hemoglobin 8.2 (L) 12.0 - 15.0 g/dL   HCT 11.924.0 (L) 14.736.0 - 82.946.0 %  Brain natriuretic peptide     Status: Abnormal   Collection Time: 07/20/21  9:47 PM  Result Value Ref Range   B Natriuretic Peptide 169.1 (H) 0.0 - 100.0 pg/mL    Comment: Performed at [facility], [address] [place], [place] [address]    Chemistries  Recent Labs  Lab 07/20/21 1754 07/20/21 1820  NA 137 140  K 4.1 4.2  CL 105 105  CO2 23  --   GLUCOSE 79 80  BUN 26* 24*  CREATININE 0.77 0.60  CALCIUM 8.7*  --   AST 17  --   ALT 5  --   ALKPHOS 141*  --   BILITOT 0.8  --    ------------------------------------------------------------------------------------------------------------------  ------------------------------------------------------------------------------------------------------------------ GFR: Estimated Creatinine Clearance: 66.4 mL/min (by C-G formula based on SCr of 0.6 mg/dL). Liver Function Tests: Recent Labs  Lab 07/20/21 1754  AST 17  ALT 5  ALKPHOS 141*  BILITOT 0.8  PROT 7.4  ALBUMIN 1.6*   No results for input(s): LIPASE, AMYLASE in the last 168 hours. No results for input(s): AMMONIA in the last 168 hours. Coagulation Profile: No results for input(s): INR, PROTIME in the last 168 hours. Cardiac Enzymes: No results for input(s): CKTOTAL, CKMB, CKMBINDEX, TROPONINI in the last 168 hours. BNP (last 3 results) No results for input(s): PROBNP in the last 8760 hours. HbA1C: No results for input(s): HGBA1C in the last 72 hours. CBG: No results for input(s): GLUCAP in the last 168 hours. Lipid Profile: No results for input(s): CHOL, HDL, LDLCALC, TRIG, CHOLHDL, LDLDIRECT in the last 72 hours. Thyroid Function Tests: No results for input(s): TSH, T4TOTAL, FREET4, T3FREE, THYROIDAB in the last 72 hours. Anemia Panel: No results for input(s): VITAMINB12, FOLATE, FERRITIN, TIBC, IRON,  RETICCTPCT in the last 72 hours.  --------------------------------------------------------------------------------------------------------------- Urine analysis:    Component Value Date/Time   COLORURINE YELLOW 07/20/2021 1654   APPEARANCEUR CLEAR 07/20/2021 1654   LABSPEC 1.010 07/20/2021 1654   PHURINE 7.0 07/20/2021 1654   GLUCOSEU NEGATIVE 07/20/2021 1654   HGBUR LARGE (A) 07/20/2021 1654   BILIRUBINUR NEGATIVE 07/20/2021 1654   KETONESUR NEGATIVE 07/20/2021 1654   PROTEINUR NEGATIVE 07/20/2021 1654   UROBILINOGEN 0.2 05/14/2008 0020   NITRITE POSITIVE (A) 07/20/2021 1654   LEUKOCYTESUR MODERATE (  A) 07/20/2021 1654      Imaging Results:    DG Chest Portable 1 View  Result Date: 07/20/2021 CLINICAL DATA:  Possible sepsis. EXAM: PORTABLE CHEST 1 VIEW COMPARISON:  June 30, 2021 FINDINGS: Cardiomediastinal silhouette is normal. Mediastinal contours appear intact. There is no evidence of focal airspace consolidation, pleural effusion or pneumothorax. Mild diffuse prominence of the interstitial markings. Osseous structures are without acute abnormality. Soft tissues are grossly normal. IMPRESSION: Mild diffuse prominence of the interstitial markings, which may represent pulmonary vascular congestion or early atypical pneumonia. Electronically Signed   By: Ted Mcalpine M.D.   On: 07/20/2021 17:14    My personal review of EKG: Rhythm NSR, no ST changes   Assessment & Plan:    Active Problems:   Generalized weakness   Bilateral upper extremity weakness-patient brought to the ED with encephalopathy and bilateral upper extremity weakness, lesion not new however CT head showed possibility of acute/subacute pontine stroke.  MRI brain and MRI cervical spine has been ordered.  I called and discussed with neurologist on-call Dr. Wilford Corner, he will follow-up on the MRI brain results and see the patient if she has a stroke.  We will follow the results of MRI cervical spine.  We will consult  ID or neurosurgery based on the results of MRI cervical spine. Metabolic encephalopathy-likely at baseline, patient is oriented to self only.  We will follow results of MRI brain. Community-acquired pneumonia-concern for pneumonia on CT chest, patient has been started on vancomycin and cefepime.  Follow blood culture results. Probable CAUTI-patient has abnormal UA, she has a Foley catheter in place for recurrent urinary retention.  Urine culture has been obtained.  We will follow the results of culture. Nutrition-patient has PEG tube in place, will continue Osmolite at 65 mill per hour.  125 mL water every 4 hours via tube. Cervical epidural abscess-s/p C5-C6 ACDF on 8/7, cultures were positive for MSSA, patient was started on cefazolin and rifampin till 08/11/2021.  We will hold cefazolin and rifampin at this time as patient has been started on vancomycin and cefepime as above. T12/L1 dystrophic changes-CT abdomen/pelvis showed T12/L1 dystrophic changes, MRI lumbar spine ordered.  We will follow the results.    DVT Prophylaxis-   SCDs   AM Labs Ordered, also please review Full Orders  Family Communication: Admission, patients condition and plan of care including tests being ordered have been discussed with the patient's daughter on phonr who indicate understanding and agree with the plan and Code Status.  Code Status: Full code  Admission status:, Inpatient :The appropriate admission status for this patient is INPATIENT. Inpatient status is judged to be reasonable and necessary in order to provide the required intensity of service to ensure the patient's safety. The patient's presenting symptoms, physical exam findings, and initial radiographic and laboratory data in the context of their chronic comorbidities is felt to place them at high risk for further clinical deterioration. Furthermore, it is not anticipated that the patient will be medically stable for discharge from the hospital within 2  midnights of admission. The following factors support the admission status of inpatient.     The patient's presenting symptoms include generalized weakness The worrisome physical exam findings include generalized weakness. The initial radiographic and laboratory data are worrisome because of possible stroke. 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

## 2021-07-21 NOTE — Progress Notes (Signed)
Patient seen and examined personally, I reviewed the chart, history and physical and admission note, done by admitting physician this morning and agree with the same with following addendum.  Please refer to the morning admission note for more detailed plan of care.  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  In the ED CT head showed acute/subacute possible pontine infarct, CT abdomen pelvis showed dystrophic changes inferior endplate of T12, L1 vertebral body, narrowing of the intervening intervertebral disc and a small amount of soft tissue thickening anteriorly, new finding compared to previous CTs and may represent sequelae of discitis/osteomyelitis, MRI lumbar C-spine was ordered and lab work showed leukocytosis 12.5, CT chest small amount of airspace consolidation versus atelectasis in the bilateral lung bases.  MRI brain positive for 1.5 acute to subacute ischemic nonhemorrhagic central pontine infarct. MRI C-spine 'Sequelae of interval ACDF at C5-6. 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 3. Associated ventral epidural phlegmon/abscess extending from C4 through C6-7 with up to moderate spinal stenosis at these levels. No visible cord signal changes on this motion degraded exam. 4. Associated prevertebral and posterior paraspinous edema without visible discrete soft tissue abscess on this motion  degraded exam"  MRI L-spine "Changes consistent with osteomyelitis discitis at T12-L1. Probable tiny 7 mm abscess along the right margin of the T12-L1 disc space. No other significant epidural phlegmon or abscess. 2. Acute septic arthritis of the right SI joint. Associated edema and enhancement within the adjacent right posterior paraspinous and iliacus musculature. Superimposed 2.2 x 1.4 cm soft tissue abscess as above. 3. Underlying multilevel lumbar spondylosis and facet arthrosis with resultant mild-to-moderate lateral recess narrowing at L2-3 through L4-5 as above."  Seen and examined this morning.,  Patient is alert awake able to tell me her name and date of birth she thinks she is in the kitchen, denies any complaint.  She is able to move her lower extremities bend knees bilaterally and move upper extremities able to squeeze but grossly weak all over. Follows commands. PEG tube in place  Issues  Acute ischemic stroke Bilateral upper extremity weakness w/ abnormal mri c & l spine: septic emboli versus cardioembolic versus large vessel etiology: In the setting of recent epidural spinal abscess with cervical osteomyelitis, discitis.  Neurology on board completing stroke work-up with A1c, lipid panel echocardiogram, CT angio  Worsening osteomyelitis discitis due to MSSA at C5-6, C6-7, C7-T1 after ACDF surgery along w/ epidural abscess C4--C6 7 w/ moderate spinal stenosis Osteomyelitis discitis at T12-L1 with abscess along with right margin of T12-L1 Acute septic arthritis of the SI joint with soft tissue abscess underwent MRI C and L-spine report details as above notified Dr. Maurice Small from neurosurgery was at bedside seeing the patient this morning.  On cefazolin and rifampin at facility through 08/11/2021 but currently switched to vancomycin/cefepime.  ID also consulted her for further inputs, follow-up blood culture Recent Labs  Lab 07/20/21 1754 07/21/21 0500  WBC 12.5* 12.6*   LATICACIDVEN 1.3  --  Metabolic encephalopathy appears to have chronic encephalopathy at baseline oriented to self, reportedly more confused at the facility and brought to the ED.  Probable CAUTI-abnormal UA on antibiotics, follow-up culture Pneumonia concern for pneumonia on the CT chest empiric vancomycin/cefepime  FEN/PEG tube in place continue tube feeding.  Speech has seen the patient and starting on dysphagia 1/pured diet, RD will be consulted to adjust tube feed.  Goals of care: Currently full code.  Given destructive spreading infectious process along with acute stroke, encephalopathy overall prognosis does not appear bright palliative care consulted.

## 2021-07-21 NOTE — Consult Note (Signed)
Neurosurgery Consultation  Reason for Consult: Epidural abscess Referring Physician: Kc  CC: AMS  HPI: This is a 75 y.o. woman w/ recent MSSA epidural abscess s/p ACDF 8/7. She was more confused than usual and brought back to the ED. Workup showed a pontine infarct on MRI, likely CAUTI, CAP, some new changes on CT eval of the spine so MRI was obtained and NSGY consulted. The patient complains of some itching and denies any pain, specifically any neck / back pain, no radicular pain, no new weakness, but she does moan with movement of any her of extremities. She says they don't hurt but she's afraid of spilling her soda, which does not exist.    ROS: A 14 point ROS was performed and is negative except as noted in the HPI.   PMHx: History reviewed. No pertinent past medical history. FamHx: No family history on file. SocHx:  has no history on file for tobacco use, alcohol use, and drug use.  Exam: Vital signs in last 24 hours: Temp:  [98.7 F (37.1 C)-99.6 F (37.6 C)] 98.7 F (37.1 C) (09/11 0256) Pulse Rate:  [99-117] 105 (09/11 1100) Resp:  [15-32] 24 (09/11 1100) BP: (97-171)/(61-140) 164/79 (09/11 1100) SpO2:  [94 %-100 %] 100 % (09/11 1100) General: Only opens eyes when the lights are off, confused but generally cooperative, lying in bed, appears chronically ill Head: Normocephalic and atruamatic HEENT: Neck supple, transverse cervical incision well healed Pulmonary: breathing room air comfortably, no evidence of increased work of breathing Cardiac: mildly tachy  Abdomen: S NT ND Extremities: 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 Face grossly symmetric while speaking but would not participate in testing. Strength diffusely 4+/5 x4, unable to test sensation or ankle clonus due to difficulty with  participation in exam but Fcx4 without preference, no hoffman's, patellar reflexes 1+   Assessment and Plan: 75 y.o. woman s/p ACDF last month for epidural abscess. MRI L-spine personally reviewed, which shows new T2 changes at the T12-1 disc space c/w discitis, small T2 region in the right parapneumonic disc space that could be a small abscess. Also new R SI joint septic arthritis with associated fluid collection. CTA reviewed w/ respect to spinal osseous anatomy and shows hardware intact and in good position. MRI C-spine reviewed, shows new changes c/w discitis at C6-7 / C7-T1 / T1-T2. A lot of motion in the exam but there's posterior edema that is new or worsened than before c/w septic arthritis. Worsened ventral inflammatory changes c/w epidural phlegmon vs abscess.  -unfortunately, her disease has progressed after drainage of the abscess. Further drainage of her cervical epidural collection is going to be substantially more invasive and unable to completely sterilize the area. Given the new pontine infarct, pneumonia, and progressive multifocal discitis / abscesses, I don't think that further surgery is going to be helpful and is likely to be harmful.  -if new Cx are needed, best target would be the abscess over her SI joint for IR drainage -please call with any concerns or questions  Jadene Pierini, MD 07/21/21 11:30 AM [facility]

## 2021-07-22 DIAGNOSIS — G062 Extradural and subdural abscess, unspecified: Secondary | ICD-10-CM

## 2021-07-22 DIAGNOSIS — M8639 Chronic multifocal osteomyelitis, multiple sites: Secondary | ICD-10-CM

## 2021-07-22 DIAGNOSIS — G928 Other toxic encephalopathy: Secondary | ICD-10-CM

## 2021-07-22 DIAGNOSIS — Z515 Encounter for palliative care: Secondary | ICD-10-CM

## 2021-07-22 DIAGNOSIS — I6310 Cerebral infarction due to embolism of unspecified precerebral artery: Secondary | ICD-10-CM

## 2021-07-22 DIAGNOSIS — M4620 Osteomyelitis of vertebra, site unspecified: Secondary | ICD-10-CM | POA: Diagnosis present

## 2021-07-22 DIAGNOSIS — R531 Weakness: Secondary | ICD-10-CM

## 2021-07-22 LAB — GLUCOSE, CAPILLARY
Glucose-Capillary: 78 mg/dL (ref 70–99)
Glucose-Capillary: 55 mg/dL — ABNORMAL LOW (ref 70–99)
Glucose-Capillary: 63 mg/dL — ABNORMAL LOW (ref 70–99)
Glucose-Capillary: 101 mg/dL — ABNORMAL HIGH (ref 70–99)
Glucose-Capillary: 89 mg/dL (ref 70–99)
Glucose-Capillary: 115 mg/dL — ABNORMAL HIGH (ref 70–99)
Glucose-Capillary: 89 mg/dL (ref 70–99)

## 2021-07-22 LAB — CBC
HCT: 28.4 % — ABNORMAL LOW (ref 36.0–46.0)
Hemoglobin: 8.7 g/dL — ABNORMAL LOW (ref 12.0–15.0)
MCH: 30.7 pg (ref 26.0–34.0)
MCHC: 30.6 g/dL (ref 30.0–36.0)
MCV: 100.4 fL — ABNORMAL HIGH (ref 80.0–100.0)
Platelets: 217 10*3/uL (ref 150–400)
RBC: 2.83 MIL/uL — ABNORMAL LOW (ref 3.87–5.11)
RDW: 16.6 % — ABNORMAL HIGH (ref 11.5–15.5)
WBC: 12.3 10*3/uL — ABNORMAL HIGH (ref 4.0–10.5)
nRBC: 0 % (ref 0.0–0.2)

## 2021-07-22 LAB — COMPREHENSIVE METABOLIC PANEL
ALT: 7 U/L (ref 0–44)
AST: 22 U/L (ref 15–41)
Albumin: 1.6 g/dL — ABNORMAL LOW (ref 3.5–5.0)
Alkaline Phosphatase: 112 U/L (ref 38–126)
Anion gap: 10 (ref 5–15)
BUN: 21 mg/dL (ref 8–23)
CO2: 20 mmol/L — ABNORMAL LOW (ref 22–32)
Calcium: 8.7 mg/dL — ABNORMAL LOW (ref 8.9–10.3)
Chloride: 106 mmol/L (ref 98–111)
Creatinine, Ser: 0.8 mg/dL (ref 0.44–1.00)
GFR, Estimated: >60 mL/min (ref >60)
Glucose, Bld: 71 mg/dL (ref 70–99)
Potassium: 3.9 mmol/L (ref 3.5–5.1)
Sodium: 136 mmol/L (ref 135–145)
Total Bilirubin: 1.9 mg/dL — ABNORMAL HIGH (ref 0.3–1.2)
Total Protein: 7 g/dL (ref 6.5–8.1)

## 2021-07-22 MED ORDER — THIAMINE HCL 100 MG/ML IJ SOLN
400.0000 mg | Freq: Three times a day (TID) | INTRAVENOUS | Status: AC
  Administered 2021-07-22 – 2021-07-25 (×9): 400 mg via INTRAVENOUS
  Filled 2021-07-22 (×10): qty 4

## 2021-07-22 MED ORDER — KETOROLAC TROMETHAMINE 15 MG/ML IJ SOLN
15.0000 mg | Freq: Four times a day (QID) | INTRAMUSCULAR | Status: DC | PRN
Start: 2021-07-22 — End: 2021-07-24
  Administered 2021-07-23: 15 mg via INTRAVENOUS
  Filled 2021-07-22 (×2): qty 1

## 2021-07-22 MED ORDER — DIPHENHYDRAMINE HCL 50 MG/ML IJ SOLN
25.0000 mg | Freq: Four times a day (QID) | INTRAMUSCULAR | Status: DC | PRN
  Administered 2021-07-22 – 2021-07-27 (×10): 25 mg via INTRAVENOUS
  Filled 2021-07-22 (×10): qty 1

## 2021-07-22 MED ORDER — DIPHENHYDRAMINE HCL 50 MG/ML IJ SOLN
25.0000 mg | Freq: Once | INTRAMUSCULAR | Status: AC
  Administered 2021-07-22: 25 mg via INTRAVENOUS
  Filled 2021-07-22: qty 1

## 2021-07-22 MED ORDER — OSMOLITE 1.5 CAL PO LIQD
1000.0000 mL | ORAL | Status: DC
  Administered 2021-07-23: 1000 mL

## 2021-07-22 MED ORDER — TRIAMCINOLONE 0.1 % CREAM:EUCERIN CREAM 1:1
TOPICAL_CREAM | Freq: Two times a day (BID) | CUTANEOUS | Status: DC
  Administered 2021-07-30 – 2021-08-04 (×2): 1 via TOPICAL
  Filled 2021-07-22 (×3): qty 1

## 2021-07-22 MED ORDER — OLANZAPINE 2.5 MG PO TABS
2.5000 mg | ORAL_TABLET | Freq: Every day | ORAL | Status: DC
  Administered 2021-07-22 – 2021-07-27 (×6): 2.5 mg via ORAL
  Filled 2021-07-22 (×6): qty 1

## 2021-07-22 MED ORDER — THIAMINE HCL 100 MG/ML IJ SOLN
100.0000 mg | Freq: Three times a day (TID) | INTRAMUSCULAR | Status: DC
  Administered 2021-07-26 – 2021-07-28 (×8): 100 mg via INTRAVENOUS
  Filled 2021-07-22 (×8): qty 2

## 2021-07-22 MED ORDER — VANCOMYCIN HCL 1250 MG/250ML IV SOLN
1250.0000 mg | INTRAVENOUS | Status: DC
  Administered 2021-07-22: 1250 mg via INTRAVENOUS
  Filled 2021-07-22: qty 250

## 2021-07-22 MED ORDER — PROSOURCE TF PO LIQD
45.0000 mL | Freq: Three times a day (TID) | ORAL | Status: DC
  Administered 2021-07-22 – 2021-08-06 (×44): 45 mL
  Filled 2021-07-22 (×44): qty 45

## 2021-07-22 MED ORDER — DEXTROSE 5 % IV SOLN: INTRAVENOUS | Status: AC

## 2021-07-22 MED ORDER — DEXTROSE 5 % IV SOLN: INTRAVENOUS | Status: DC

## 2021-07-22 MED ORDER — ATORVASTATIN CALCIUM 40 MG PO TABS
40.0000 mg | ORAL_TABLET | Freq: Every day | ORAL | Status: DC
  Administered 2021-07-22 – 2021-07-27 (×6): 40 mg via ORAL
  Filled 2021-07-22 (×6): qty 1

## 2021-07-22 MED ORDER — SODIUM CHLORIDE 0.9 % IV SOLN
2.0000 g | INTRAVENOUS | Status: DC
  Administered 2021-07-22 – 2021-07-24 (×9): 2 g via INTRAVENOUS
  Filled 2021-07-22 (×14): qty 2000

## 2021-07-22 MED ORDER — LUBRIDERM SERIOUSLY SENSITIVE EX LOTN
TOPICAL_LOTION | Freq: Two times a day (BID) | CUTANEOUS | Status: DC
  Administered 2021-07-30 – 2021-08-04 (×3): 1 via TOPICAL
  Filled 2021-07-22 (×4): qty 473

## 2021-07-22 NOTE — Evaluation (Signed)
Physical Therapy Evaluation Patient Details Name: Kristy [name] MRN: [id number] DOB: 08-20-1946 Today's Date: 07/22/2021  History of Present Illness  75 y.o. woman who presented to the ED from SNF 9/11 due to increased confusion and SOB. MRI (+) pontine infarct. Concern for Pneumonia and probably CAUTI. 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 Previously admitted to [facility] 8/1 due to a fall and underwent ACDF at C5-6 (06/16/2021) Hospital course complicated by osteomyelitis discitis at C5-T1, epidural abscess from C4-C7, encephalopathy and dysphagia/PEG tube placed, recurrent urinary retention. PMH: HTN, polycthemia, alcohol use.   Clinical Impression  Pt admitted with above diagnosis. Pt currently with functional limitations due to the deficits listed below (see PT Problem List). At the time of PT eval pt was able to perform bed mobility only due to pain. Focus of session was linen and gown change 2 feeding tube leak. RN present and notified during session. At this time, pt requiring +2 total assist for all mobility and ADL's. She opens her eyes to command but otherwise keeps them closed. Anticipate pt will d/c back to SNF to continue multidisciplinary skilled therapies. Acutely, pt will benefit from skilled PT to increase their independence and safety with mobility to allow discharge to the venue listed below.          Recommendations for follow up therapy are one component of a multi-disciplinary discharge planning process, led by the attending physician.  Recommendations may be updated based on patient status, additional functional criteria and insurance authorization.  Follow Up Recommendations SNF;Supervision/Assistance - 24 hour    Equipment  Recommendations  Other (comment) (TBA)    Recommendations for Other Services       Precautions / Restrictions Precautions Precautions: Fall;Cervical;Other (comment) (at risk for skin breakdown) Precaution Booklet Issued: No Precaution Comments: PEG; indwelling foley Required Braces or Orthoses:  (Pt has B Prevalon boots at SNF form prior admission - educated daughter on need to bring those in if she does not DC back to her SNF soon) Restrictions Weight Bearing Restrictions: No Other Position/Activity Restrictions: no brace needed (from prior ACDF)      Mobility  Bed Mobility Overal bed mobility: Needs Assistance Bed Mobility: Rolling Rolling: +2 for physical assistance;Total assist         General bed mobility comments: total A +2 for rolling; pt does not initiate any movement and begins yelling with minimal movement; With gentle handling, pt is able to tolerate rolling R; Increased pain when rolling L; unable to tolerate sidelying to sit this date. will need Lift to move OOB to chair    Transfers                 General transfer comment:  (NA this date)  Ambulation/Gait                Stairs            Wheelchair Mobility    Modified Rankin (Stroke Patients Only)       Balance     Sitting balance-Leahy Scale: Zero Sitting balance - Comments: Unable to assess  Pertinent Vitals/Pain Pain Assessment: Faces Faces Pain Scale: Hurts whole lot Pain Location: back, neck, arms. legs Pain Descriptors / Indicators: Discomfort;Grimacing;Guarding;Crying;Moaning Pain Intervention(s): Limited activity within patient's tolerance;Monitored during session;Repositioned    Home Living Family/patient expects to be discharged to:: Skilled nursing facility                      Prior Function Level of Independence: Independent         Comments: Prior to fall on 8/1., pt independent; At DC to SNF  @9 /29, pt total A +2 for bed mobility and unable to tolerate sitting EOB     Hand Dominance   Dominant Hand: Right    Extremity/Trunk Assessment   Upper Extremity Assessment Upper Extremity Assessment: Defer to OT evaluation RUE Deficits / Details: weak grasp; unable to make full grasp; elbow held in position of comfort @ 60 degrees flex; unable to tolerate full extension with @ 20-30 degrees of elbow extension contracture. Able to tolerate @ 100 degrees elbow flexion with complaints of pain. Unable to complete hand to mouth pattern or use hand functionally. Begins crying/moaning when her own hadn touched her face. Tolerates @ 45 degrees of shoulder flex; abnormal scapular glide/scapular; moderate edema; fragile skin; tightness LUE Deficits / Details: similar to RUE; not using functionally    Lower Extremity Assessment Lower Extremity Assessment: Generalized weakness RLE Deficits / Details: Unable to fully assess as pt very painful and focus of session was functional mobility to get gown and bed linen changed. No active movement of LE's noted.    Cervical / Trunk Assessment Cervical / Trunk Assessment: Other exceptions Cervical / Trunk Exceptions: neck contracture;  pt holds extension with cervical rotation and lateral flexion to R; moaning/screaming with any attempts of movement or repositioning  Communication   Communication: No difficulties  Cognition Arousal/Alertness: Awake/alert (although eyes closed 90% of session) Behavior During Therapy: Restless Overall Cognitive Status: Impaired/Different from baseline Area of Impairment: Orientation;Attention;Memory;Following commands;Safety/judgement;Awareness;Problem solving                 Orientation Level: Disoriented to;Place;Time;Situation Current Attention Level: Focused Memory: Decreased recall of precautions;Decreased short-term memory Following Commands: Follows one step commands inconsistently Safety/Judgement:  Decreased awareness of safety;Decreased awareness of deficits Awareness: Intellectual Problem Solving: Slow processing;Decreased initiation;Difficulty sequencing;Requires verbal cues;Requires tactile cues General Comments: Eyes closed majority of session - opens on commands; Thnks she is in 08[birth year]13-1979.      General Comments General comments (skin integrity, edema, etc.): Mepalex pads on sacral area and over R ischium replaced. Moisture barrier cream applied after gently cleaning pt    Exercises Other Exercises Other Exercises: encouraged daughter to perform ROM BUE as tolerated Other Exercises: Educated on importance of keeping B heels elevated off bed   Assessment/Plan    PT Assessment Patient needs continued PT services  PT Problem List Decreased strength;Decreased range of motion;Decreased activity tolerance;Decreased balance;Decreased mobility;Decreased coordination;Impaired sensation;Pain;Decreased knowledge of use of DME;Decreased cognition;Decreased knowledge of precautions       PT Treatment Interventions DME instruction;Gait training;Stair training;Functional mobility training;Therapeutic activities;Therapeutic exercise;Balance training;Patient/family education;Cognitive remediation    PT Goals (Current goals can be found in the Care Plan section)  Acute Rehab PT Goals Patient Stated Goal: per family for pt to get better PT Goal Formulation: Patient unable to participate in goal setting Time For Goal Achievement: 08/05/21 Potential to Achieve Goals: Fair    Frequency Min 2X/week   Barriers to discharge  Co-evaluation PT/OT/SLP Co-Evaluation/Treatment: Yes Reason for Co-Treatment: Complexity of the patient's impairments (multi-system involvement);Necessary to address cognition/behavior during functional activity;For patient/therapist safety;To address functional/ADL transfers PT goals addressed during session: Mobility/safety with mobility         AM-PAC PT  "6 Clicks" Mobility  Outcome Measure Help needed turning from your back to your side while in a flat bed without using bedrails?: Total Help needed moving from lying on your back to sitting on the side of a flat bed without using bedrails?: Total Help needed moving to and from a bed to a chair (including a wheelchair)?: Total Help needed standing up from a chair using your arms (e.g., wheelchair or bedside chair)?: Total Help needed to walk in hospital room?: Total Help needed climbing 3-5 steps with a railing? : Total 6 Click Score: 6    End of Session Equipment Utilized During Treatment: Gait belt Activity Tolerance: Patient limited by pain Patient left: in bed;with call bell/phone within reach;with bed alarm set;with nursing/sitter in room;with SCD's reapplied Nurse Communication: Mobility status PT Visit Diagnosis: Other abnormalities of gait and mobility (R26.89);Muscle weakness (generalized) (M62.81);Pain;History of falling (Z91.81);Adult, failure to thrive (R62.7) Pain - part of body:  (generalized)    Time: 9678-9381 PT Time Calculation (min) (ACUTE ONLY): 34 min   Charges:   PT Evaluation $PT Eval High Complexity: 1 High          Conni Slipper, PT, DPT Acute Rehabilitation Services Pager: [phone] Office: [phone]   Marylynn Pearson 07/22/2021, 4:03 PM

## 2021-07-22 NOTE — Progress Notes (Signed)
Patient able to tolerate 1/4 ensure

## 2021-07-22 NOTE — Progress Notes (Addendum)
Initial Nutrition Assessment  DOCUMENTATION CODES:   Non-severe (moderate) malnutrition in context of chronic illness  INTERVENTION:  TF recommendations via PEG: -Change to Osmolite 1.5 to 55 mL/hr (1320 mL/day) -Increase 45 mL ProSource TF - TID -125 mL Free Water q4h (Per MD)  Provides 2100 kcal, 116 gm PRO, & 1006 mL free water (1756 mL total w/ water flushes)  -Consider transitioning to nocturnal TF as pt PO intake increases.  -Continue Thiamine supplement   NUTRITION DIAGNOSIS:   Moderate Malnutrition related to chronic illness as evidenced by mild fat depletion, mild muscle depletion.   GOAL:   Patient will meet greater than or equal to 90% of their needs   MONITOR:   PO intake, TF tolerance, Skin  REASON FOR ASSESSMENT:   Consult Enteral/tube feeding initiation and management  ASSESSMENT:   Pt admitted with confusion and shortness of breath; found with acute ischemic stroke and worsening spinal abscess. Pt recently discharged from [facility] on 9/1 to SNF; admitted with cervical abscess and confusion, resulting in a PEG placement. PMH include alcohol abuse.  Pt uses PEG as main source of nutrition. Current home TF regimen includes Osmolite at 65 mL/hr; pt was unable to provide accurate information regarding her TF. Reports that she uses 2 cans/day and that she cooks her TF prior, high suspect that this is inaccurate as pt is intermittently confused. Reports tolerating her TF.   Pt reports that she cannot eat by mouth, she takes one bite and it makes her sick. Reported at first that she did like the Ensure, then asked about it again and she did not like it. SLP evaluated the pT on 9/11 and placed pt on a D1 diet; per nurse pt has not been eating.   Per EMR, pt had a 6% weight loss in 1 week; unsure how much was related to fluid retention.   Medications reviewed: Thiamin  Labs reviewed.   NUTRITION - FOCUSED PHYSICAL EXAM:  Flowsheet Row Most Recent Value  Orbital  Region Mild depletion  Upper Arm Region Mild depletion  Thoracic and Lumbar Region Mild depletion  Buccal Region Mild depletion  Temple Region Mild depletion  Clavicle Bone Region Mild depletion  Clavicle and Acromion Bone Region Mild depletion  Scapular Bone Region Mild depletion  Dorsal Hand Mild depletion  Patellar Region Moderate depletion  Anterior Thigh Region Moderate depletion  Posterior Calf Region Moderate depletion  Edema (RD Assessment) Moderate  Hair Reviewed  Eyes Unable to assess  Mouth Reviewed  [No top teeth]  Skin Reviewed  [Dry,  rash]  Nails Reviewed       Diet Order:   Diet Order             DIET - DYS 1 Room service appropriate? Yes; Fluid consistency: Thin  Diet effective now                   EDUCATION NEEDS:   No education needs have been identified at this time  Skin:  Skin Assessment: Skin Integrity Issues: Skin Integrity Issues:: Other (Comment) Other: Dry; rash; flakey; tears on buttocks  Last BM:  No Documentation  Height:   Ht Readings from Last 1 Encounters:  07/19/21 5\' 6"  (1.676 m)    Weight:   Wt Readings from Last 1 Encounters:  07/19/21 84.1 kg    Ideal Body Weight:  59.1 kg  BMI:  There is no height or weight on file to calculate BMI.  Estimated Nutritional Needs:   Kcal:  2100-2300  Protein:  100-120 gm  Fluid:  > 2.1 L    Telitha Plath BS, PLDN Clinical Dietitian See [facility] for contact information.

## 2021-07-22 NOTE — Evaluation (Signed)
Occupational Therapy Evaluation Patient Details Name: Kristy [name] MRN: [id number] DOB: Jul 28, 1946 Today's Date: 07/22/2021   History of Present Illness 75 y.o. woman who presented to the ED from SNF 9/11 due to increased confusion and SOB. MRI (+) pontine infarct. Concern for Pneumonia and probablye CAUTI. 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 PMH: HTN, polycthemia, alcohol use.   Clinical Impression   Familiar with pt from previous admission. PTA on 8/1, pt independent with ADL and mobility and enjoyed spending time with family. At time of DC to SNF 9/1, pt total A with all ADL and mobility. At time of DC to SNF, pt limited significantly by pain. On eval today, Pt tolerated rolling to R with slow, gentle handling, however significant pain with rolling toward L. Pt does not attempt to initiate any movement to help with bed mobility. Limited active movement of B hands and elbows ( no active shoulder movement observed) however not able to use BUE functionally. Cervical contraction in extension and R cervical rotation/lateral flexion and does not tolerate any movement to neck. Pt moans/crys out with attempts of any movement. Total A +2 for all mobility and ADL. Pt confused but responding to questions appropriately at times, especially when responding to daughter or talking about her family. Left with HOB @ 40 degrees with use of Trendelenberg feature to increase HOB. In addition to confusion and  level of arousal, pain control is significant limiting factor. Will work with pain management and follow acutely. Will need to DC back to SNF for attempts at rehab.  Recommend nsg try to place pt on "chair position" @ bed level as tolerated.     Recommendations for follow up therapy are one component of a multi-disciplinary discharge planning process, led by the attending physician.  Recommendations may be updated based on patient status, additional functional criteria and insurance authorization.   Follow Up Recommendations  SNF    Equipment Recommendations  None recommended by OT    Recommendations for Other Services       Precautions / Restrictions Precautions Precautions: Fall;Cervical;Other (comment) (at risk for skin breakdown) Precaution Comments: PEG; indwelling foley Required Braces or Orthoses:  (Pt has B Prevalon boots at SNF from prior admission - educated daughter on need to bring those in if she does not DC back to her SNF soon)      Mobility Bed Mobility Overal bed mobility: Needs Assistance   Rolling: +2 for physical assistance;Total assist         General bed mobility comments: total A +2 for rolling; pt does not initiate any movement and begins yelling with minimal movement; With gentle handling, pt is able to tolerate rolling R; Increased pain when rolling L; unable to tolerate sidelying to sit this date. will need Lift to move OOB to chair    Transfers                 General transfer comment:  (NA this date)    Balance     Sitting balance-Leahy Scale: Zero  ADL either performed or assessed with clinical judgement   ADL Overall ADL's : Needs assistance/impaired                                       General ADL Comments: total A with all ADL tasks     Vision   Additional Comments: will further assess; keeps eyes closed     Perception     Praxis      Pertinent  Vitals/Pain Pain Assessment: Faces Faces Pain Scale: Hurts whole lot Pain Location: back, neck, arms. legs Pain Descriptors / Indicators: Discomfort;Grimacing;Guarding;Crying;Moaning Pain Intervention(s): Limited activity within patient's tolerance;Premedicated before session;Relaxation;Repositioned     Hand Dominance Right   Extremity/Trunk Assessment Upper Extremity Assessment Upper Extremity Assessment: RUE deficits/detail;LUE deficits/detail RUE Deficits / Details: weak grasp; unable to make full grasp; elbow held in position of comfort @ 60 degrees flex; unable to tolerate full extension with @ 20-30 degrees of elbow extension contracture. Able to tolerate @ 100 degrees elbow flexion with complaints of pain. Unable to complete hand to mouth pattern or use hand functionally. Begins crying/moaning when her own hadn touched her face. Tolerates @ 45 degrees of shoulder flex; abnormal scapular glide/scapular; moderate edema; fragile skin; tightness LUE Deficits / Details: similar to RUE; not using functionally   Lower Extremity Assessment Lower Extremity Assessment: Defer to PT evaluation   Cervical / Trunk Assessment Cervical / Trunk Assessment: Other exceptions Cervical / Trunk Exceptions: neck contracture;  pt holds extension with cervical rotation and lateral flexion to R; moaning/screaming with any attempts of movement or repositioning   Communication     Cognition Arousal/Alertness: Awake/alert (although eyes closed 90% of session) Behavior During Therapy: Restless Overall Cognitive Status: Impaired/Different from baseline                   Orientation Level: Disoriented to;Place;Time;Situation Current Attention Level: Focused Memory: Decreased recall of precautions;Decreased short-term memory Following Commands: Follows one step commands inconsistently Safety/Judgement: Decreased awareness of safety;Decreased awareness of deficits Awareness: Intellectual Problem  Solving: Slow processing;Decreased initiation;Difficulty sequencing;Requires verbal cues;Requires tactile cues General Comments: Eyes clsoed majority of session - opens on commands; Thnks she is in [employer] Comments  Mepalex pads on sacral area and over R ischium replaced. Moisture barrier cream applied after gently cleaning pt    Exercises Other Exercises Other Exercises: encouraged daughter to perform ROM BUE as tolerated Other Exercises: Educated on importance of keeping B heels elevated off bed   Shoulder Instructions      Home Living Family/patient expects to be discharged to:: Skilled nursing facility                                        Prior Functioning/Environment Level of Independence: Independent        Comments: Prior to fall on 8/1., pt independent; At DC to SNF @9 /29, pt total A +2 for bed mobility adn unable to tolerate sitting EOB        OT Problem List: Decreased strength;Decreased range of motion;Decreased activity tolerance;Impaired balance (sitting and/or standing);Impaired vision/perception;Decreased coordination;Decreased cognition;Decreased safety awareness;Decreased knowledge of use of DME or AE;Impaired sensation;Impaired UE functional use;Pain;Impaired tone;Increased edema      OT Treatment/Interventions: Self-care/ADL training;Therapeutic exercise;Neuromuscular education;DME and/or AE instruction;Therapeutic activities;Splinting;Cognitive remediation/compensation;Visual/perceptual remediation/compensation;Patient/family education;Balance training    OT  Goals(Current goals can be found in the care plan section) Acute Rehab OT Goals Patient Stated Goal: per family for pt to get better OT Goal Formulation: With family Time For Goal Achievement: 08/05/21 Potential to Achieve Goals: Fair  OT Frequency: Min 2X/week   Barriers to D/C:            Co-evaluation              AM-PAC OT "6 Clicks" Daily Activity      Outcome Measure Help from another person eating meals?: Total Help from another person taking care of personal grooming?: Total Help from another person toileting, which includes using toliet, bedpan, or urinal?: Total Help from another person bathing (including washing, rinsing, drying)?: Total Help from another person to put on and taking off regular upper body clothing?: Total Help from another person to put on and taking off regular lower body clothing?: Total 6 Click Score: 6   End of Session Nurse Communication: Mobility status;Other (comment) (need to turn q 2 hours)  Activity Tolerance: Patient limited by pain Patient left: in bed;with call bell/phone within reach;with chair alarm set;with family/visitor present;with nursing/sitter in room  OT Visit Diagnosis: Unsteadiness on feet (R26.81);Other abnormalities of gait and mobility (R26.89);Muscle weakness (generalized) (M62.81);History of falling (Z91.81);Other symptoms and signs involving cognitive function;Pain Pain - part of body:  (with all movement)                Time: 1320-1355 OT Time Calculation (min): 35 min Charges:  OT General Charges $OT Visit: 1 Visit OT Evaluation $OT Eval Moderate Complexity: 1 Mod  Elianny Buxbaum, OT/L   Acute OT Clinical Specialist Acute Rehabilitation Services Pager [phone] Office [phone]   [facility] 07/22/2021, 3:44 PM

## 2021-07-22 NOTE — Progress Notes (Signed)
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 Her strength on initial neurologic exam this admission was 3/5 in all extremities without reported spasticity/tremor/rigidity.   I reviewed repeat mri. It appears changes could be post-op/evolution of infection but no drastic worsening of treatment  There is a question if she has been receiving abx via midline 9/2-9/10 at nursing home; midline removed just prior to 9/10 admission    I spoke with neurology, and new review unclear if pontine mri changes is stroke. Team is considering extension of infection vs osmotic demyelination.   From ID standpoint, I do not see obvious worsening in infectious process.   She has dermatitis that appears to be worse. Not clear what her baseline is but rash not new per daughter. She itches more as of 9/12. So will stop rifampin and continue nafcillin given concern of meningitis per repeat neurology evaluation   PLAN: I agree with neurology it could be benefit in terms of  prognosis/diagnosis (weakness/confusion) what the pontinue mri finding signify (with new mri with contrast plus/minus LP) While we are waiting for patient's family decision regarding #1, will keep her on nafcillin for the next 2 weeks as that has cns penetration vs cefazolin The original plan in terms of abx was to go till 10/02. This will depend on what we find on repeat imaging. I suspect will need longer abx with indefinite suppressive abx for the hardware Given the potential rifampin worsening itch, will hold for now Trend crp/cbc/cmp q72hrs Await neurology discussion with family   I spent more than 65 minute reviewing data/chart, and coordinating care and >50% direct face to face time providing counseling/discussing diagnostics/treatment plan with patient   Active Problems:   Generalized weakness   Cerebral thrombosis with cerebral infarction   Cerebral embolism with cerebral infarction   Subarachnoid hemorrhage   Intracerebral hemorrhage   No Known Allergies  Scheduled Meds:   stroke: mapping our early stages of recovery book   Does not apply Once   acetaminophen  650 mg Per Tube Q6H   allopurinol  100 mg Per Tube Daily   aspirin EC  81 mg Oral Daily   atorvastatin  40 mg Oral QHS   Chlorhexidine Gluconate Cloth  6 each Topical Daily   diclofenac Sodium  4 g Topical TID AC & HS   feeding supplement (OSMOLITE 1.2 CAL)  1,000 mL Per Tube Q24H   feeding supplement (PROSource TF)  45 mL Per Tube BID   free water  125 mL Per Tube Q4H   lubriderm seriously sensitive   Topical BID   midodrine  5 mg  Per Tube TID WC   OLANZapine  2.5 mg Oral QHS   thiamine  100 mg Per Tube Daily   triamcinolone 0.1 % cream : eucerin   Topical BID   Continuous Infusions:  sodium chloride 75 mL/hr at 07/21/21 1201   dextrose     nafcillin IV     PRN Meds:.acetaminophen **OR** acetaminophen, diphenhydrAMINE, hydrOXYzine, ondansetron **OR** ondansetron (ZOFRAN) IV,  oxyCODONE   SUBJECTIVE: Patient with more itch on chronic skin dermatitis. No new rash though No f/c No n/v Still generalized weakness  Our ID team called the nursing home who reports that patient was given cefazolin. We don't see a picc here though  Multidisciplinary meeting today with family; ID/palliative care/primary team were available. Case discussed with neurology as well  Review of Systems: ROS All other ROS was negative, except mentioned above     OBJECTIVE: Vitals:   07/21/21 1434 07/21/21 1528 07/21/21 2000 07/22/21 0907  BP:  (!) 111/96 (!) 144/95 (!) 122/109  Pulse:  (!) 104 87 98  Resp:  18 18 20   Temp: 98.4 F (36.9 C) 98.2 F (36.8 C) (!) 97.5 F (36.4 C) 97.8 F (36.6 C)  TempSrc: Oral Oral Oral Oral  SpO2:  100% 97% 100%   There is no height or weight on file to calculate BMI.  Physical Exam General/constitutional: mild-mod distress complaining of itch; cooperative; verbal is appropriate HEENT: Normocephalic, PER, Conj Clear, EOMI, tongue with white  plaque like change but doesn't scrape off or bleed Neck supple CV: rrr no mrg Lungs: clear to auscultation, normal respiratory effort Abd: Soft, Nontender Ext: no edema Skin: maceration and raw skin multiple places on trunk/ext; no vaginal/oral mucosal involvement; some fresh bleeding at a few places of raw skin. No palpable purpura or petechiae. Hyperpigmentation bilateral distal LE Neuro: generalized weakness; no spasticity/tremors MSK: no focal joint swelling/tenderness; cries out as we turn her of generalized discomfort/pain   Lab Results Lab Results  Component Value Date   WBC 12.3 (H) 07/22/2021   HGB 8.7 (L) 07/22/2021   HCT 28.4 (L) 07/22/2021   MCV 100.4 (H) 07/22/2021   PLT 217 07/22/2021    Lab Results  Component Value Date   CREATININE 0.80 07/22/2021   BUN 21 07/22/2021   NA 136 07/22/2021   K 3.9 07/22/2021   CL 106 07/22/2021   CO2 20 (L) 07/22/2021    Lab Results   Component Value Date   ALT 7 07/22/2021   AST 22 07/22/2021   ALKPHOS 112 07/22/2021   BILITOT 1.9 (H) 07/22/2021      Microbiology: Recent Results (from the past 240 hour(s))  Resp Panel by RT-PCR (Flu A&B, Covid) Nasopharyngeal Swab     Status: None   Collection Time: 07/20/21  5:20 PM   Specimen: Nasopharyngeal Swab; Nasopharyngeal(NP) swabs in vial transport medium  Result Value Ref Range Status   SARS Coronavirus 2 by RT PCR NEGATIVE NEGATIVE Final    Comment: (NOTE) SARS-CoV-2 target nucleic acids are NOT DETECTED.  The SARS-CoV-2 RNA is generally detectable in upper respiratory specimens during the acute phase of infection. The lowest concentration of SARS-CoV-2 viral copies this assay can detect is 138 copies/mL. A negative result does not preclude SARS-Cov-2 infection and should not be used as the sole basis for treatment or other patient management decisions. A negative result may occur with  improper specimen collection/handling, submission of specimen other than nasopharyngeal swab, presence of viral mutation(s) within the areas targeted by this assay, and  inadequate number of viral copies(<138 copies/mL). A negative result must be combined with clinical observations, patient history, and epidemiological information. The expected result is Negative.  Fact Sheet for Patients:  BloggerCourse.com  Fact Sheet for Healthcare Providers:  SeriousBroker.it  This test is no t yet approved or cleared by the [place] FDA and  has been authorized for detection and/or diagnosis of SARS-CoV-2 by FDA under an Emergency Use Authorization (EUA). This EUA will remain  in effect (meaning this test can be used) for the duration of the COVID-19 declaration under Section 564(b)(1) of the Act, 21 U.S.C.section 360bbb-3(b)(1), unless the authorization is terminated  or revoked sooner.       Influenza A by PCR NEGATIVE NEGATIVE  Final   Influenza B by PCR NEGATIVE NEGATIVE Final    Comment: (NOTE) The Xpert Xpress SARS-CoV-2/FLU/RSV plus assay is intended as an aid in the diagnosis of influenza from Nasopharyngeal swab specimens and should not be used as a sole basis for treatment. Nasal washings and aspirates are unacceptable for Xpert Xpress SARS-CoV-2/FLU/RSV testing.  Fact Sheet for Patients: BloggerCourse.com  Fact Sheet for Healthcare Providers: SeriousBroker.it  This test is not yet approved or cleared by the [place] FDA and has been authorized for detection and/or diagnosis of SARS-CoV-2 by FDA under an Emergency Use Authorization (EUA). This EUA will remain in effect (meaning this test can be used) for the duration of the COVID-19 declaration under Section 564(b)(1) of the Act, 21 U.S.C. section 360bbb-3(b)(1), unless the authorization is terminated or revoked.  Performed at [facility], [address], [place], [place] [address]   Culture, blood (routine x 2)     Status: None (Preliminary result)   Collection Time: 07/20/21  6:06 PM   Specimen: BLOOD  Result Value Ref Range Status   Specimen Description BLOOD SITE NOT SPECIFIED  Final   Special Requests   Final    BOTTLES DRAWN AEROBIC AND ANAEROBIC Blood Culture adequate volume   Culture   Final    NO GROWTH 2 DAYS Performed at [facility], [address], [place], [place] [address]    Report Status PENDING  Incomplete  Culture, blood (routine x 2)     Status: None (Preliminary result)   Collection Time: 07/20/21  6:09 PM   Specimen: BLOOD  Result Value Ref Range Status   Specimen Description BLOOD SITE NOT SPECIFIED  Final   Special Requests   Final    BOTTLES DRAWN AEROBIC ONLY Blood Culture adequate volume   Culture   Final    NO GROWTH 2 DAYS Performed at [facility], [address] [place], [place] [address]    Report Status PENDING  Incomplete  MRSA  Next Gen by PCR, Nasal     Status: None   Collection Time: 07/20/21  9:44 PM   Specimen: Nasal Mucosa; Nasal Swab  Result Value Ref Range Status   MRSA by PCR Next Gen NOT DETECTED NOT DETECTED Final    Comment: (NOTE) The GeneXpert MRSA Assay (FDA approved for NASAL specimens only), is one component of a comprehensive MRSA colonization surveillance program. It is not intended to diagnose MRSA infection nor to guide or monitor treatment for MRSA infections. Test performance is not FDA approved in patients less than 59 years old. Performed at [facility], [address] [place], [place] [address]      Serology:   Imaging: If present, new imagings (plain films, ct scans, and  mri) have been personally visualized and interpreted; radiology reports have been reviewed. Decision making incorporated into the Impression / Recommendations.  9/11 cspine mri wwo contrast 1. Technically limited exam due to motion artifact. 2. Sequelae of interval ACDF at C5-6. 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 3. Associated ventral epidural phlegmon/abscess extending from C4 through C6-7 with up to moderate spinal stenosis at these levels. No visible cord signal changes on this motion degraded exam. 4. Associated prevertebral and posterior paraspinous edema without visible discrete soft tissue abscess on this motion degraded exam. 5. Evolving acute to subacute pontine infarct, better evaluated on prior brain MRI  9/11 lumbar mri wwo contrast 1. 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 Probable tiny 7 mm abscess along the right margin of the T12-L1 disc space. No other significant epidural phlegmon or abscess. 2. Acute septic arthritis of the right SI joint. Associated edema and enhancement within the adjacent right posterior paraspinous and iliacus musculature. Superimposed 2.2 x 1.4 cm soft tissue abscess as  above. 3. Underlying multilevel lumbar spondylosis and facet arthrosis with resultant mild-to-moderate lateral recess narrowing at L2-3 through L4-5 as above.  9/11 mri brain without contrast 1. 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 2. Underlying mild to moderate chronic microvascular ischemic disease.  Raymondo Bandrung T Anaka Beazer, MD [facility] [phone] pager    07/22/2021, 3:01 PM

## 2021-07-22 NOTE — Progress Notes (Addendum)
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 She was brought from the nursing facility due to shortness of breath, more confusion than usual baseline .   In the ED CT head showed acute/subacute possible pontine infarct, CT abdomen pelvis showed dystrophic changes inferior endplate of Z85, L1 vertebral body, narrowing of the intervening intervertebral disc and a small amount of soft tissue thickening anteriorly, new finding compared to previous CTs and may represent sequelae of discitis/osteomyelitis, MRI lumbar C-spine was ordered and lab work showed leukocytosis 12.5, CT chest small amount of airspace consolidation versus atelectasis in the bilateral lung bases.   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  9/11 Neurosurgery and ID was consulted-neurosurgery did not advise acute  surgical intervention. Seen by speech and placed on pured diet  Subjective: Seen and examined this  morning. Patient is alert awake able to tell me her name date of birth, states she is in the hospital. Overnight no fever blood pressure stable 100-160s, saturating on room air. 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  completed stroke work-up with A1c 5.1 , lipid panel  ldl 88, goal < 70,CT angio head and neck no LVO. On ASA 81 mg, Lipitor 40 mg. Echo with EF 60 to 65%, no R WMA, normal mitral valve, aortic valve with mild AR.  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 Zada Finders from neurosurgery-do not advise any surgery at this time,ID is following.  Further work-up per ID neurology.  Follow-up blood culture urine culture.  Keeping on nafcillin for now for better CNS penetration.   Recent Labs  Lab 07/20/21 1754 07/21/21 0500 07/22/21 0324  WBC 12.5* 12.6* 12.3*  LATICACIDVEN 1.3  --   --     Acute Metabolic encephalopathy:appears to have subacute encephalopathy, mental state has cleared  some on discharge on 9/1 intermittent confusion.  She was able to tell me her name date of birth current  location.Reportedly more confused at the facility and brought to the ED.Likely multifactorial in the setting of her persistent discitis, question CAUTI and due to her deconditioning. Continue thiamine, bedtime Zyprexa.  Neurology on board  await further recommendations.  CKD stage II- recent AKI- creat stable Recent Labs  Lab 07/20/21 1754 07/20/21 1820 07/21/21 0500 07/22/21 0324  BUN 26* 24* 21 21  CREATININE 0.77 0.60 0.78 0.80    ? CAUTI Recurrent urine retention needing Foley catheter  sincne last admission On admission abnormal UA  wbc 21-50, LE moderate dn nitrite +on antibiotics, follow-up culture- requested urine culture add on.  ?Pneumonia vs atelectasis:CT chest a small amount of airspace consolidation versus atelectasis in the bilateral lung base.  No obvious respiratory symptoms less likely pneumonia.  Will benefit with incentive spirometry once more oriented.    FEN/PEG tube in place continue tube feeding.  Speech has seen the patient and started on dysphagia 1/pured diet, RD consulted to adjust tube feeds. Moderate Protein Calorie malnutrition: low albumin- on Tube feeds, now on PO diet as well. Hypoglycemia on tube feeding and p.o. diet.  Add D5 water for few hrs and stop Recent Labs  Lab 07/21/21 2319 07/22/21 0339 07/22/21 1230 07/22/21 1305 07/22/21 1341  GLUCAP 68* 78 55* 63* 101*     Mildly elevated bilirubin:Monitor  Chronic dermatitis/chronic itching continue Atarax added Benadryl.  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 ID on board - rash worsening-could be from rifampin hold.   Hypertension blood pressure stable she has been on midodrine PTAAa and will continue.  Deconditining Ambulaltory dysfunction Functional quadriplegia: In the setting of discitis osteomyelitis, physical deconditioning encephalopathy.  PT OT as tolerated.  Goals of care: Currently full code.  Given destructive spreading infectious process along with acute stroke,  encephalopathy overall prognosis does not appear in this unfortunate patient, palliative care consulted.  Had family meeting this morning with patient's wife, daughter along with palliative, neurology and infectious disease.  Diet Order             DIET - DYS 1 Room service appropriate? Yes; Fluid consistency: Thin  Diet effective now                   Patient's There is no height or weight on file to calculate BMI. DVT prophylaxis: SCDs Start: 07/21/21 0106 Code Status:   Code Status: Full Code  Family Communication: plan of care discussed with patient at bedside. Met daughter-husband along with neuro, PMT, ID. Daughter requesting transfer for second opinion and requesting to try [facility].  Called [facility] transfer center, spoke with the trance for coordinator regarding request for transfer from daughter for second opinion, left detailed message and waiting for callback  Status is: Inpatient Remains inpatient appropriate because:Ongoing diagnostic testing needed not appropriate for outpatient work up, IV treatments appropriate due to intensity of illness or inability to take PO, and Inpatient level of care appropriate due to severity of illness Dispo: The patient is from: SNF              Anticipated d/c is to: TBD              Patient currently is not medically stable to d/c.   Difficult to place patient No Unresulted Labs (From admission, onward)     Start     Ordered  07/22/21 0500  Comprehensive metabolic panel  Daily,   R      07/21/21 1140   07/22/21 0500  CBC  Daily,   R      07/21/21 1140          Medications reviewed: Scheduled Meds:   stroke: mapping our early stages of recovery book   Does not apply Once   acetaminophen  650 mg Per Tube Q6H   allopurinol  100 mg Per Tube Daily   aspirin EC  81 mg Oral Daily   atorvastatin  40 mg Oral QHS   Chlorhexidine Gluconate Cloth  6 each Topical Daily   diclofenac Sodium  4 g Topical TID AC & HS   feeding  supplement (OSMOLITE 1.2 CAL)  1,000 mL Per Tube Q24H   feeding supplement (PROSource TF)  45 mL Per Tube BID   free water  125 mL Per Tube Q4H   lubriderm seriously sensitive   Topical BID   midodrine  5 mg Per Tube TID WC   OLANZapine  2.5 mg Oral QHS   thiamine  100 mg Per Tube Daily   triamcinolone 0.1 % cream : eucerin   Topical BID   Continuous Infusions:  sodium chloride 75 mL/hr at 07/21/21 1201   vancomycin     Consultants:see note  Procedures:see note Antimicrobials: Anti-infectives (From admission, onward)    Start     Dose/Rate Route Frequency Ordered Stop   07/22/21 1215  vancomycin (VANCOREADY) IVPB 1250 mg/250 mL        1,250 mg 166.7 mL/hr over 90 Minutes Intravenous Every 24 hours 07/22/21 1120     07/21/21 1700  rifampin (RIFADIN) 300 mg in sodium chloride 0.9 % 100 mL IVPB  Status:  Discontinued        300 mg 200 mL/hr over 30 Minutes Intravenous Every 12 hours 07/21/21 1327 07/22/21 1014   07/21/21 1600  nafcillin 2 g in sodium chloride 0.9 % 100 mL IVPB  Status:  Discontinued        2 g 200 mL/hr over 30 Minutes Intravenous Every 4 hours 07/21/21 1455 07/22/21 1014   07/21/21 1330  nafcillin injection 2 g  Status:  Discontinued        2 g Intravenous Every 4 hours 07/21/21 1326 07/21/21 1455   07/21/21 0000  vancomycin (VANCOCIN) IVPB 1000 mg/200 mL premix  Status:  Discontinued        1,000 mg 200 mL/hr over 60 Minutes Intravenous Every 12 hours 07/20/21 2156 07/21/21 1326   07/20/21 2200  ceFEPIme (MAXIPIME) 2 g in sodium chloride 0.9 % 100 mL IVPB  Status:  Discontinued        2 g 200 mL/hr over 30 Minutes Intravenous Every 8 hours 07/20/21 2143 07/21/21 1326   07/20/21 2145  vancomycin (VANCOREADY) IVPB 1750 mg/350 mL        1,750 mg 175 mL/hr over 120 Minutes Intravenous  Once 07/20/21 2143 07/21/21 0308      Culture/Microbiology    Component Value Date/Time   SDES BLOOD SITE NOT SPECIFIED 07/20/2021 1809   SPECREQUEST  07/20/2021 1809     BOTTLES DRAWN AEROBIC ONLY Blood Culture adequate volume   CULT  07/20/2021 1809    NO GROWTH < 24 HOURS Performed at [facility], [address] [place], [place] [address]    REPTSTATUS PENDING 07/20/2021 1809    Other culture-see note  Objective: Vitals: Today's Vitals   07/21/21 2000 07/21/21 2236 07/22/21  0800 07/22/21 0907  BP: (!) 144/95   (!) 122/109  Pulse: 87   98  Resp: 18   20  Temp: (!) 97.5 F (36.4 C)   97.8 F (36.6 C)  TempSrc: Oral   Oral  SpO2: 97%   100%  PainSc: 0-No pain Asleep 8      Intake/Output Summary (Last 24 hours) at 07/22/2021 1314 Last data filed at 07/22/2021 0641 Gross per 24 hour  Intake 1541.13 ml  Output 2400 ml  Net -858.87 ml   There were no vitals filed for this visit. Weight change:   Intake/Output from previous day: 09/11 0701 - 09/12 0700 In: 1541.1 [I.V.:660; NG/GT:10; IV Piggyback:701.1] Out: 2400 [Urine:2400] Intake/Output this shift: No intake/output data recorded. There were no vitals filed for this visit. Examination: General exam: AAO to self, current place, older than stated age, weak appearing. HEENT:Oral mucosa moist, Ear/Nose WNL grossly,dentition normal. Respiratory system: bilaterally diminished no use of accessory muscle, non tender. Cardiovascular system: S1 & S2 +,No JVD. Gastrointestinal system: Abdomen soft, NT,ND, BS+.PEG+ Nervous System:Alert, awake, able to squeeze her hand, move her  knees, w/ generalized weakness.   Extremities: no edema, distal peripheral pulses palpable.  Skin: No rashes,no icterus. MSK: Normal muscle bulk,tone, power Data Reviewed: I have personally reviewed following labs and imaging studies CBC: Recent Labs  Lab 07/20/21 1754 07/20/21 1820 07/21/21 0500 07/22/21 0324  WBC 12.5*  --  12.6* 12.3*  NEUTROABS 8.8*  --   --   --   HGB 8.0* 8.2* 7.9* 8.7*  HCT 26.0* 24.0* 25.2* 28.4*  MCV 100.4*  --  99.6 100.4*  PLT 212  --  204 875   Basic Metabolic Panel: Recent  Labs  Lab 07/20/21 1754 07/20/21 1820 07/21/21 0500 07/22/21 0324  NA 137 140 135 136  K 4.1 4.2 3.8 3.9  CL 105 105 104 106  CO2 23  --  21* 20*  GLUCOSE 79 80 86 71  BUN 26* 24* 21 21  CREATININE 0.77 0.60 0.78 0.80  CALCIUM 8.7*  --  8.6* 8.7*   GFR: Estimated Creatinine Clearance: 66.4 mL/min (by C-G formula based on SCr of 0.8 mg/dL). Liver Function Tests: Recent Labs  Lab 07/20/21 1754 07/22/21 0324  AST 17 22  ALT 5 7  ALKPHOS 141* 112  BILITOT 0.8 1.9*  PROT 7.4 7.0  ALBUMIN 1.6* 1.6*   No results for input(s): LIPASE, AMYLASE in the last 168 hours. No results for input(s): AMMONIA in the last 168 hours. Coagulation Profile: No results for input(s): INR, PROTIME in the last 168 hours. Cardiac Enzymes: No results for input(s): CKTOTAL, CKMB, CKMBINDEX, TROPONINI in the last 168 hours. BNP (last 3 results) No results for input(s): PROBNP in the last 8760 hours. HbA1C: Recent Labs    07/21/21 0500  HGBA1C 5.1   CBG: Recent Labs  Lab 07/21/21 1959 07/21/21 2319 07/22/21 0339 07/22/21 1230 07/22/21 1305  GLUCAP 90 68* 78 55* 63*   Lipid Profile: Recent Labs    07/21/21 0500  CHOL 137  HDL 29*  LDLCALC 88  TRIG 100  CHOLHDL 4.7   Thyroid Function Tests: No results for input(s): TSH, T4TOTAL, FREET4, T3FREE, THYROIDAB in the last 72 hours. Anemia Panel: No results for input(s): VITAMINB12, FOLATE, FERRITIN, TIBC, IRON, RETICCTPCT in the last 72 hours. Sepsis Labs: Recent Labs  Lab 07/20/21 1754  LATICACIDVEN 1.3    Recent Results (from the past 240 hour(s))  Resp Panel by RT-PCR (Flu A&B, Covid)  Nasopharyngeal Swab     Status: None   Collection Time: 07/20/21  5:20 PM   Specimen: Nasopharyngeal Swab; Nasopharyngeal(NP) swabs in vial transport medium  Result Value Ref Range Status   SARS Coronavirus 2 by RT PCR NEGATIVE NEGATIVE Final    Comment: (NOTE) SARS-CoV-2 target nucleic acids are NOT DETECTED.  The SARS-CoV-2 RNA is generally  detectable in upper respiratory specimens during the acute phase of infection. The lowest concentration of SARS-CoV-2 viral copies this assay can detect is 138 copies/mL. A negative result does not preclude SARS-Cov-2 infection and should not be used as the sole basis for treatment or other patient management decisions. A negative result may occur with  improper specimen collection/handling, submission of specimen other than nasopharyngeal swab, presence of viral mutation(s) within the areas targeted by this assay, and inadequate number of viral copies(<138 copies/mL). A negative result must be combined with clinical observations, patient history, and epidemiological information. The expected result is Negative.  Fact Sheet for Patients:  EntrepreneurPulse.com.au  Fact Sheet for Healthcare Providers:  IncredibleEmployment.be  This test is no t yet approved or cleared by the [place] FDA and  has been authorized for detection and/or diagnosis of SARS-CoV-2 by FDA under an Emergency Use Authorization (EUA). This EUA will remain  in effect (meaning this test can be used) for the duration of the COVID-19 declaration under Section 564(b)(1) of the Act, 21 U.S.C.section 360bbb-3(b)(1), unless the authorization is terminated  or revoked sooner.       Influenza A by PCR NEGATIVE NEGATIVE Final   Influenza B by PCR NEGATIVE NEGATIVE Final    Comment: (NOTE) The Xpert Xpress SARS-CoV-2/FLU/RSV plus assay is intended as an aid in the diagnosis of influenza from Nasopharyngeal swab specimens and should not be used as a sole basis for treatment. Nasal washings and aspirates are unacceptable for Xpert Xpress SARS-CoV-2/FLU/RSV testing.  Fact Sheet for Patients: EntrepreneurPulse.com.au  Fact Sheet for Healthcare Providers: IncredibleEmployment.be  This test is not yet approved or cleared by the [place] FDA  and has been authorized for detection and/or diagnosis of SARS-CoV-2 by FDA under an Emergency Use Authorization (EUA). This EUA will remain in effect (meaning this test can be used) for the duration of the COVID-19 declaration under Section 564(b)(1) of the Act, 21 U.S.C. section 360bbb-3(b)(1), unless the authorization is terminated or revoked.  Performed at [facility], [place] [address], [place], [place] [address]   Culture, blood (routine x 2)     Status: None (Preliminary result)   Collection Time: 07/20/21  6:06 PM   Specimen: BLOOD  Result Value Ref Range Status   Specimen Description BLOOD SITE NOT SPECIFIED  Final   Special Requests   Final    BOTTLES DRAWN AEROBIC AND ANAEROBIC Blood Culture adequate volume   Culture   Final    NO GROWTH < 24 HOURS Performed at [facility], [place] [address], [place], [place] [address]    Report Status PENDING  Incomplete  Culture, blood (routine x 2)     Status: None (Preliminary result)   Collection Time: 07/20/21  6:09 PM   Specimen: BLOOD  Result Value Ref Range Status   Specimen Description BLOOD SITE NOT SPECIFIED  Final   Special Requests   Final    BOTTLES DRAWN AEROBIC ONLY Blood Culture adequate volume   Culture   Final    NO GROWTH < 24 HOURS Performed at [facility], [place],  [place] [address]    Report Status PENDING  Incomplete  MRSA Next Gen by PCR, Nasal     Status: None   Collection Time: 07/20/21  9:44 PM   Specimen: Nasal Mucosa; Nasal Swab  Result Value Ref Range Status   MRSA by PCR Next Gen NOT DETECTED NOT DETECTED Final    Comment: (NOTE) The GeneXpert MRSA Assay (FDA approved for NASAL specimens only), is one component of a comprehensive MRSA colonization surveillance program. It is not intended to diagnose MRSA infection nor to guide or monitor treatment for MRSA infections. Test performance is not FDA approved in patients less than 13 years old. Performed at [facility], [place] [address], [place], [place] [address]      Radiology Studies: CT ANGIO HEAD W OR WO CONTRAST  Result Date: 07/21/2021 CLINICAL DATA:  75 year old female with neurologic deficit. 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 Cervical ACDF last month. EXAM: CT ANGIOGRAPHY HEAD AND NECK TECHNIQUE: Multidetector CT imaging of the head and neck was performed using the standard protocol during bolus administration of intravenous contrast. Multiplanar CT image reconstructions and MIPs were obtained to evaluate the vascular anatomy. Carotid stenosis measurements (when applicable) are obtained utilizing NASCET criteria, using the distal internal carotid diameter as the denominator. CONTRAST:  5m OMNIPAQUE IOHEXOL 350 MG/ML SOLN COMPARISON:  Brain MRI 0029 hours today.  Head CT 07/20/2021. FINDINGS: CT HEAD Brain: Confluent hypodensity in the central pons is less distinct but otherwise stable from the head CT yesterday. No associated hemorrhage or mass effect. Stable gray-white matter differentiation elsewhere. No midline shift or ventriculomegaly. No acute intracranial hemorrhage identified. Calvarium and skull base: Congenital incomplete ossification of the posterior C1 ring. No acute osseous abnormality identified. Paranasal sinuses: Visualized paranasal sinuses and mastoids are stable and well aerated. Orbits: No acute orbit or scalp soft tissue finding. CTA NECK Skeleton: Absent maxillary dentition. Recent C5-C6 ACDF. Advanced cervical spine degeneration elsewhere. Partially visible thoracic kyphoscoliosis. No acute osseous abnormality identified, but there is prevertebral phlegmon extending from just below the ACDF hardware to the thoracic inlet, T2 level. No discrete or enhancing fluid collection. Upper chest: Evidence of small bilateral layering pleural effusions with atelectasis. Pulmonary septal thickening in the upper lungs. No superior mediastinal  lymphadenopathy. Visible central pulmonary arteries are enhancing and appear patent. Other neck: Mild postoperative soft tissue changes to the right anterior neck and tracking along the right thyroid to the prevertebral space related to recent ACDF. Otherwise negative. Aortic arch: 3 vessel arch configuration. Mild arch atherosclerosis. Right carotid system: Mildly tortuous brachiocephalic artery and proximal right CCA without plaque or stenosis. Negative right carotid bifurcation and cervical right ICA. Left carotid system: Negative left CCA aside from mild tortuosity. Mild calcified plaque at the posterior left ICA origin without stenosis. Vertebral arteries: Normal proximal right subclavian artery aside from tortuosity. Normal right vertebral artery origin. The V1 segment is affected by the paraspinal phlegmon, but remains patent with no stenosis identified. The right V2 and V3 segments are patent. No stenosis to the skull base. Proximal left subclavian artery and left vertebral artery origin are normal. The left vertebral is mildly dominant. The V1 segment is tortuous in the region of the paraspinal phlegmon. The vessel remains patent without stenosis to the skull base. CTA HEAD Posterior circulation: Dominant left vertebral V4 segment primarily supplies the basilar, the right V4 is diminutive beyond the patent PICA origin. Left PICA origin is patent. Patent basilar artery. No  basilar stenosis. Patent SCA and PCA origins. Posterior communicating arteries are diminutive or absent. Bilateral PCA branches are patent with mild irregularity. Anterior circulation: Both ICA siphons are patent. No right siphon plaque or stenosis. No left siphon plaque, although the distal left ICA appears mildly stenotic to the terminus - but might simply be non dominant due to the ACA anatomy. Both carotid termini are patent. Patent MCA origins. Right ACA A1 is dominant, the left is diminutive or absent. Normal anterior communicating  artery. Bilateral ACA branches are within normal limits. Bilateral MCA M1 segments and bifurcations are somewhat diminutive but patent without stenosis. No MCA branch occlusion identified, right MCA branches are mildly irregular. Venous sinuses: Superior sagittal sinus, dominant right transverse and sigmoid sinus and right IJ bulb are patent. Anatomic variants: Dominant left vertebral artery, the right functionally terminates in PICA. Dominant right and diminutive or absent left ACA A1 segments. Review of the MIP images confirms the above findings IMPRESSION: 1. 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 2. Negative for large vessel occlusion. Basilar Artery is patent without stenosis. 3. Stable CT appearance of the brain since yesterday. 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 4. Mild for age atherosclerosis in the neck. More advanced intracranial atherosclerosis, but no significant arterial stenosis identified. 5. Recent C5-C6 ACDF. 6. Small layering pleural effusions with suspected pulmonary interstitial edema. Electronically Signed   By: Genevie Ann M.D.   On: 07/21/2021 09:24   CT HEAD WO CONTRAST (5MM)  Result Date: 07/20/2021 CLINICAL DATA:  Mental status change EXAM: CT HEAD WITHOUT CONTRAST TECHNIQUE: Contiguous axial images were obtained from the base of the skull through the vertex without intravenous contrast. COMPARISON:  MRI 06/15/2021, CT brain 06/14/2021 FINDINGS: Brain: No hemorrhage or intracranial mass is visualized. New focal hypodensity within the pons, series 2, image 14, sagittal series 5, image 33. Mild motion degradation. Atrophy and mild chronic small vessel ischemic changes of the white matter. Stable ventricle size. Vascular: No hyperdense vessels.  No unexpected calcification Skull: Normal. Negative for  fracture or focal lesion. Sinuses/Orbits: No acute finding. Other: Rotated appearance of C1 on C2 likely due to head position. Incomplete fusion posterior arch of C1 IMPRESSION: 1. Findings are suspicious for acute to subacute pontine infarct, this is new compared to the previous exams from August. No hemorrhage identified 2. Atrophy and chronic small vessel ischemic changes of the white matter Electronically Signed   By: Donavan Foil M.D.   On: 07/20/2021 20:06   CT ANGIO NECK W OR WO CONTRAST  Result Date: 07/21/2021 CLINICAL DATA:  75 year old female with neurologic deficit. 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 Cervical ACDF last month. EXAM: CT ANGIOGRAPHY HEAD AND NECK TECHNIQUE: Multidetector CT imaging of the head and neck was performed using the standard protocol during bolus administration of intravenous contrast. Multiplanar CT image reconstructions and MIPs were obtained to evaluate the vascular anatomy. Carotid stenosis measurements (when applicable) are obtained utilizing NASCET criteria, using the distal internal carotid diameter as the denominator. CONTRAST:  53m OMNIPAQUE IOHEXOL 350 MG/ML SOLN COMPARISON:  Brain MRI 0029 hours today.  Head CT 07/20/2021. FINDINGS: CT HEAD Brain: Confluent hypodensity in the central pons is less distinct but otherwise stable from the head CT yesterday. No associated hemorrhage or mass effect. Stable gray-white matter differentiation elsewhere. No midline shift or ventriculomegaly. No acute intracranial hemorrhage  identified. Calvarium and skull base: Congenital incomplete ossification of the posterior C1 ring. No acute osseous abnormality identified. Paranasal sinuses: Visualized paranasal sinuses and mastoids are stable and well aerated. Orbits: No acute orbit or scalp soft tissue finding. CTA NECK Skeleton: Absent maxillary dentition. Recent C5-C6 ACDF. Advanced cervical spine degeneration elsewhere. Partially  visible thoracic kyphoscoliosis. No acute osseous abnormality identified, but there is prevertebral phlegmon extending from just below the ACDF hardware to the thoracic inlet, T2 level. No discrete or enhancing fluid collection. Upper chest: Evidence of small bilateral layering pleural effusions with atelectasis. Pulmonary septal thickening in the upper lungs. No superior mediastinal lymphadenopathy. Visible central pulmonary arteries are enhancing and appear patent. Other neck: Mild postoperative soft tissue changes to the right anterior neck and tracking along the right thyroid to the prevertebral space related to recent ACDF. Otherwise negative. Aortic arch: 3 vessel arch configuration. Mild arch atherosclerosis. Right carotid system: Mildly tortuous brachiocephalic artery and proximal right CCA without plaque or stenosis. Negative right carotid bifurcation and cervical right ICA. Left carotid system: Negative left CCA aside from mild tortuosity. Mild calcified plaque at the posterior left ICA origin without stenosis. Vertebral arteries: Normal proximal right subclavian artery aside from tortuosity. Normal right vertebral artery origin. The V1 segment is affected by the paraspinal phlegmon, but remains patent with no stenosis identified. The right V2 and V3 segments are patent. No stenosis to the skull base. Proximal left subclavian artery and left vertebral artery origin are normal. The left vertebral is mildly dominant. The V1 segment is tortuous in the region of the paraspinal phlegmon. The vessel remains patent without stenosis to the skull base. CTA HEAD Posterior circulation: Dominant left vertebral V4 segment primarily supplies the basilar, the right V4 is diminutive beyond the patent PICA origin. Left PICA origin is patent. Patent basilar artery. No basilar stenosis. Patent SCA and PCA origins. Posterior communicating arteries are diminutive or absent. Bilateral PCA branches are patent with mild  irregularity. Anterior circulation: Both ICA siphons are patent. No right siphon plaque or stenosis. No left siphon plaque, although the distal left ICA appears mildly stenotic to the terminus - but might simply be non dominant due to the ACA anatomy. Both carotid termini are patent. Patent MCA origins. Right ACA A1 is dominant, the left is diminutive or absent. Normal anterior communicating artery. Bilateral ACA branches are within normal limits. Bilateral MCA M1 segments and bifurcations are somewhat diminutive but patent without stenosis. No MCA branch occlusion identified, right MCA branches are mildly irregular. Venous sinuses: Superior sagittal sinus, dominant right transverse and sigmoid sinus and right IJ bulb are patent. Anatomic variants: Dominant left vertebral artery, the right functionally terminates in PICA. Dominant right and diminutive or absent left ACA A1 segments. Review of the MIP images confirms the above findings IMPRESSION: 1. 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 2. Negative for large vessel occlusion. Basilar Artery is patent without stenosis. 3. Stable CT appearance of the brain since yesterday. 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 4. Mild for age atherosclerosis in the neck. More advanced intracranial atherosclerosis, but no significant arterial stenosis identified. 5. Recent C5-C6 ACDF. 6. Small layering pleural effusions with suspected pulmonary interstitial edema. Electronically Signed   By: Genevie Ann M.D.   On: 07/21/2021 09:24   CT Chest W Contrast  Result Date: 07/20/2021 CLINICAL DATA:  Abdominal pain and fever. EXAM:  CT CHEST, ABDOMEN, AND PELVIS WITH CONTRAST TECHNIQUE: Multidetector CT imaging of the chest, abdomen and pelvis was performed following the standard protocol during bolus  administration of intravenous contrast. CONTRAST:  3m OMNIPAQUE IOHEXOL 350 MG/ML SOLN COMPARISON:  CT of the abdomen June 27, 2021 FINDINGS: CT CHEST FINDINGS Cardiovascular: Normal heart size. No pericardial effusion. No central pulmonary embolus. No dissection or aneurysmal dilation of the aorta. Mediastinum/Nodes: No enlarged mediastinal, hilar, or axillary lymph nodes. Thyroid gland, trachea, and esophagus demonstrate no significant findings. Lungs/Pleura: Small bilateral pleural effusions. Small amount of airspace consolidation versus atelectasis in the bilateral lung bases. Musculoskeletal: No chest wall mass or suspicious bone lesions identified. CT ABDOMEN PELVIS FINDINGS Hepatobiliary: No focal liver abnormality is seen. No gallstones, gallbladder wall thickening, or biliary dilatation. Pancreas: Unremarkable. No pancreatic ductal dilatation or surrounding inflammatory changes. Spleen: Normal in size without focal abnormality. Adrenals/Urinary Tract: Normal adrenal glands. Bilateral too small to be actually characterized hypoattenuated masses. Stable cystic appearing lesion in the lower pole of the left kidney. Stomach/Bowel: Gastrostomy tube is appropriately positioned within the stomach without significant surrounding inflammatory changes. No evidence of small-bowel obstruction. Normal caliber of the colon. Vascular/Lymphatic: Aortic atherosclerosis. No enlarged abdominal or pelvic lymph nodes. Reproductive: Status post hysterectomy. No adnexal masses. Other: No abdominal wall hernia or abnormality. No abdominopelvic ascites. Musculoskeletal: There has been an interval development of dystrophic changes involving the inferior endplate of TV37vertebral body and superior endplate of L1 vertebral body. There is an associated narrowing of the intervening intervertebral disc and small amount of soft tissue thickening anteriorly. This finding is new when compared to patient's prior CT dated June 27, 2021  IMPRESSION: 1. Small bilateral pleural effusions. 2. Small amount of airspace consolidation versus atelectasis in the bilateral lung bases. 3. Interval development of dystrophic changes involving the inferior endplate of TT06vertebral body and superior endplate of L1 vertebral body. There is an associated narrowing of the intervening intervertebral disc and small amount of soft tissue thickening anteriorly. This finding is new when compared to patient's prior CT dated June 27, 2021, and therefore may represent sequela of discitis/osteomyelitis. Differential diagnosis includes asymmetric discogenic osteoarthritic endplate changes. Please correlate to patient's symptoms. If further imaging evaluation is desired, contrast enhanced MRI of the lumbar spine may be considered. 4. Gastrostomy tube appropriately positioned within the stomach without significant surrounding inflammatory changes. 5. Bilateral too small to be actually characterized hypoattenuated masses in both kidneys. Aortic Atherosclerosis (ICD10-I70.0). Electronically Signed   By: DFidela SalisburyM.D.   On: 07/20/2021 20:28   MR BRAIN WO CONTRAST  Result Date: 07/21/2021 CLINICAL DATA:  Initial evaluation for mental status change. EXAM: MRI HEAD WITHOUT CONTRAST TECHNIQUE: Multiplanar, multiecho pulse sequences of the brain and surrounding structures were obtained without intravenous contrast. COMPARISON:  CT from 07/20/2021. FINDINGS: Brain: Examination degraded by motion artifact. Age-related cerebral atrophy. Patchy and confluent T2/FLAIR hyperintensity within the periventricular and deep white matter both cerebral hemispheres, most consistent with chronic small vessel ischemic disease, mild-to-moderate in nature. Acute to subacute ischemic infarct involving the central pons measures 1.5 x 0.9 cm (series 3, image 14). No associated hemorrhage or mass effect. No other evidence for acute or subacute ischemia. Gray-white matter differentiation  otherwise maintained. No other areas of remote cortical infarction. No other evidence for acute or chronic intracranial hemorrhage. No mass lesion, mass effect or midline shift. No hydrocephalus or extra-axial fluid collection. Pituitary gland suprasellar region within normal limits. Midline structures intact. Vascular: Major intracranial vascular  flow voids are grossly maintained on this motion degraded exam. Skull and upper cervical spine: Craniocervical junction within normal limits. Bone marrow signal intensity grossly normal. No scalp soft tissue abnormality. Sinuses/Orbits: Globes and orbital soft tissues demonstrate no obvious abnormality. Paranasal sinuses are largely clear. No significant mastoid effusion. Other: None. IMPRESSION: 1. 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 2. Underlying mild to moderate chronic microvascular ischemic disease. Electronically Signed   By: Jeannine Boga M.D.   On: 07/21/2021 01:41   MR CERVICAL SPINE W WO CONTRAST  Result Date: 07/21/2021 CLINICAL DATA:  Initial evaluation for cervical radiculopathy, infection suspected. EXAM: MRI CERVICAL SPINE WITHOUT AND WITH CONTRAST TECHNIQUE: Multiplanar and multiecho pulse sequences of the cervical spine, to include the craniocervical junction and cervicothoracic junction, were obtained without and with intravenous contrast. CONTRAST:  5m GADAVIST GADOBUTROL 1 MMOL/ML IV SOLN COMPARISON:  MRI from 06/16/2021. FINDINGS: Alignment: Examination severely degraded by motion artifact. Exaggeration of the normal cervical lordosis.  No listhesis. Vertebrae: Patient has undergone ACDF at C5-6 since prior exam. There is persistent marrow edema with fluid signal intensity within the C5-6 interspace, concerning for persistent and/or recurrent infection. Additionally, abnormal marrow edema in enhancement seen about the C6-7, C7-T1, and likely T1-2 interspaces, also concerning for osteomyelitis discitis.  Associated epidural phlegmon/abscess within the ventral epidural space extending from C4 through C6-7 (series 2, image 11). Abnormal enhancement seen diffusely within the dorsal epidural space extending from C2 through the upper thoracic spine. Probable small amount of ventral epidural phlegmonous change at the T1-2 level as well. Associated prevertebral edema, with additional edema and enhancement within the posterior paraspinous soft tissues. No visible discrete soft tissue abscess on this motion degraded exam. Cord: Mild flattening and deformity of the cervical cord extending from C4 through C6-7 by the ventral epidural phlegmon/collection. No visible cord signal changes on this motion degraded exam. Posterior Fossa, vertebral arteries, paraspinal tissues: Evolving acute to subacute pontine infarct noted. Paraspinous and prevertebral edema as above. Disc levels: C2-C3: Small right paracentral disc protrusion without stenosis or impingement. Foramina remain patent. C3-C4: Mild disc bulge with uncovertebral and facet hypertrophy. Mild spinal stenosis. No more than mild bilateral foraminal narrowing. C4-C5: Epidural phlegmon/abscess within the ventral epidural space. Moderate spinal stenosis with flattening of the ventral cord. Evaluation of the neural foramina limited by motion. C5-C6: Prior ACDF. Persistent ventral epidural abscess/phlegmon. Moderate spinal stenosis with mild cord flattening but no visible cord signal changes. Evaluation of the neural foramina limited by motion. C6-C7: Ventral epidural phlegmon/abscess with mild-to-moderate spinal stenosis. Mild cord flattening without visible cord signal changes. Evaluation of the foramina limited by motion. C7-T1: Changes of osteomyelitis discitis. Bilateral facet spurring, greater on the right. No significant spinal stenosis. Suspected moderate bilateral foraminal narrowing. IMPRESSION: 1. Technically limited exam due to motion artifact. 2. Sequelae of interval  ACDF at C5-6. 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 3. Associated ventral epidural phlegmon/abscess extending from C4 through C6-7 with up to moderate spinal stenosis at these levels. No visible cord signal changes on this motion degraded exam. 4. Associated prevertebral and posterior paraspinous edema without visible discrete soft tissue abscess on this motion degraded exam. 5. Evolving acute to subacute pontine infarct, better evaluated on prior brain MRI. Electronically Signed   By: BJeannine BogaM.D.   On: 07/21/2021 02:37   MR Lumbar Spine W Wo Contrast  Result Date: 07/21/2021 CLINICAL DATA:  Initial evaluation for low back  pain. EXAM: MRI LUMBAR SPINE WITHOUT AND WITH CONTRAST TECHNIQUE: Multiplanar and multiecho pulse sequences of the lumbar spine were obtained without and with intravenous contrast. CONTRAST:  32m GADAVIST GADOBUTROL 1 MMOL/ML IV SOLN COMPARISON:  MRI from 06/16/2021 FINDINGS: Segmentation: Standard. Lowest well-formed disc space labeled the L5-S1 level. Alignment: Trace retrolisthesis of L2 on L3 and L3 on L4, with 3 mm anterolisthesis of L4 on L5, chronic and facet mediated. Vertebrae: Changes consistent with osteomyelitis discitis present at T12-L1. No significant epidural phlegmon or abscess. Probable tiny 7 mm abscess along the right margin of the T12-L1 disc space (series 6, image 5). Irregularity and enhancement seen about the partially visualized right SI joint, suspicious for septic arthritis. There is edema and enhancement within the adjacent right iliacus muscle as well as the right posterior soft tissues and musculature. Superimposed abscess measuring 2.2 x 1.4 cm is seen within the right posterior soft tissues at the level of L5 (series 9, image 35). No other convincing evidence for osteomyelitis discitis or septic arthritis. Vertebral body height maintained. Bone marrow signal intensity diffusely  heterogeneous. No worrisome osseous lesions. Conus medullaris and cauda equina: Conus extends to the T12-L1 level. Conus and cauda equina appear normal. Paraspinal and other soft tissues: Remainder of the visualized soft tissues demonstrate no other acute finding. 2.7 cm simple left renal cyst noted. Disc levels: T12-L1: Findings consistent with osteomyelitis discitis. Superimposed mild disc bulge with reactive endplate change. No spinal stenosis. Foramina remain patent. L1-2: Mild disc bulge with disc desiccation. Mild right-sided facet hypertrophy. No stenosis. L2-3: Disc bulge with disc desiccation, asymmetric to the left. Moderate bilateral facet hypertrophy. Resultant moderate left with mild right lateral recess narrowing. Central canal remains patent. No significant foraminal stenosis. L3-4: Disc bulge with disc desiccation. Associated reactive endplate spurring. Moderate right worse than left facet hypertrophy. Mild narrowing of the lateral recesses. Central canal remains patent. No significant foraminal stenosis. L4-5: Anterolisthesis. Disc bulge with disc desiccation. Advanced bilateral facet arthrosis. Resultant mild canal with mild to moderate bilateral lateral recess stenosis. Mild bilateral L4 foraminal narrowing. L5-S1: Disc desiccation with minimal disc bulge. Severe left worse than right facet arthrosis. No stenosis or impingement. IMPRESSION: 1. 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 Probable tiny 7 mm abscess along the right margin of the T12-L1 disc space. No other significant epidural phlegmon or abscess. 2. Acute septic arthritis of the right SI joint. Associated edema and enhancement within the adjacent right posterior paraspinous and iliacus musculature. Superimposed 2.2 x 1.4 cm soft tissue abscess as above. 3. Underlying multilevel lumbar spondylosis and facet arthrosis with resultant mild-to-moderate lateral recess narrowing at L2-3 through L4-5 as above. Electronically  Signed   By: BJeannine BogaM.D.   On: 07/21/2021 02:18   CT ABDOMEN PELVIS W CONTRAST  Result Date: 07/20/2021 CLINICAL DATA:  Abdominal pain and fever. EXAM: CT CHEST, ABDOMEN, AND PELVIS WITH CONTRAST TECHNIQUE: Multidetector CT imaging of the chest, abdomen and pelvis was performed following the standard protocol during bolus administration of intravenous contrast. CONTRAST:  742mOMNIPAQUE IOHEXOL 350 MG/ML SOLN COMPARISON:  CT of the abdomen June 27, 2021 FINDINGS: CT CHEST FINDINGS Cardiovascular: Normal heart size. No pericardial effusion. No central pulmonary embolus. No dissection or aneurysmal dilation of the aorta. Mediastinum/Nodes: No enlarged mediastinal, hilar, or axillary lymph nodes. Thyroid gland, trachea, and esophagus demonstrate no significant findings. Lungs/Pleura: Small bilateral pleural effusions. Small amount of airspace consolidation versus atelectasis in the bilateral lung bases. Musculoskeletal: No chest wall mass or  suspicious bone lesions identified. CT ABDOMEN PELVIS FINDINGS Hepatobiliary: No focal liver abnormality is seen. No gallstones, gallbladder wall thickening, or biliary dilatation. Pancreas: Unremarkable. No pancreatic ductal dilatation or surrounding inflammatory changes. Spleen: Normal in size without focal abnormality. Adrenals/Urinary Tract: Normal adrenal glands. Bilateral too small to be actually characterized hypoattenuated masses. Stable cystic appearing lesion in the lower pole of the left kidney. Stomach/Bowel: Gastrostomy tube is appropriately positioned within the stomach without significant surrounding inflammatory changes. No evidence of small-bowel obstruction. Normal caliber of the colon. Vascular/Lymphatic: Aortic atherosclerosis. No enlarged abdominal or pelvic lymph nodes. Reproductive: Status post hysterectomy. No adnexal masses. Other: No abdominal wall hernia or abnormality. No abdominopelvic ascites. Musculoskeletal: There has been an  interval development of dystrophic changes involving the inferior endplate of S28 vertebral body and superior endplate of L1 vertebral body. There is an associated narrowing of the intervening intervertebral disc and small amount of soft tissue thickening anteriorly. This finding is new when compared to patient's prior CT dated June 27, 2021 IMPRESSION: 1. Small bilateral pleural effusions. 2. Small amount of airspace consolidation versus atelectasis in the bilateral lung bases. 3. Interval development of dystrophic changes involving the inferior endplate of B15 vertebral body and superior endplate of L1 vertebral body. There is an associated narrowing of the intervening intervertebral disc and small amount of soft tissue thickening anteriorly. This finding is new when compared to patient's prior CT dated June 27, 2021, and therefore may represent sequela of discitis/osteomyelitis. Differential diagnosis includes asymmetric discogenic osteoarthritic endplate changes. Please correlate to patient's symptoms. If further imaging evaluation is desired, contrast enhanced MRI of the lumbar spine may be considered. 4. Gastrostomy tube appropriately positioned within the stomach without significant surrounding inflammatory changes. 5. Bilateral too small to be actually characterized hypoattenuated masses in both kidneys. Aortic Atherosclerosis (ICD10-I70.0). Electronically Signed   By: Fidela Salisbury M.D.   On: 07/20/2021 20:28   DG Chest Portable 1 View  Result Date: 07/20/2021 CLINICAL DATA:  Possible sepsis. EXAM: PORTABLE CHEST 1 VIEW COMPARISON:  June 30, 2021 FINDINGS: Cardiomediastinal silhouette is normal. Mediastinal contours appear intact. There is no evidence of focal airspace consolidation, pleural effusion or pneumothorax. Mild diffuse prominence of the interstitial markings. Osseous structures are without acute abnormality. Soft tissues are grossly normal. IMPRESSION: Mild diffuse prominence of  the interstitial markings, which may represent pulmonary vascular congestion or early atypical pneumonia. Electronically Signed   By: Fidela Salisbury M.D.   On: 07/20/2021 17:14   ECHOCARDIOGRAM COMPLETE  Result Date: 07/21/2021    ECHOCARDIOGRAM REPORT   Patient Name:   AVERYANNA SAX Date of Exam: 07/21/2021 Medical Rec #:  [id number]     Height:       66.0 in Accession #:    [id number]    Weight:       185.3 lb Date of Birth:  07-13-1946      BSA:          1.936 m Patient Age:    83 years      BP:           137/70 mmHg Patient Gender: F             HR:           123 bpm. Exam Location:  Inpatient Procedure: 2D Echo, Cardiac Doppler and Color Doppler Indications:    CVA  History:        Patient has prior history of Echocardiogram examinations, most  recent 06/18/2021. Risk Factors:Hypertension.  Sonographer:    MH Referring Phys: [id number] [place]  1. Left ventricular ejection fraction, by estimation, is 60 to 65%. The left ventricle has normal function. The left ventricle has no regional wall motion abnormalities. There is mild left ventricular hypertrophy of the basal-septal segment. Left ventricular diastolic parameters are indeterminate.  2. Right ventricular systolic function is normal. The right ventricular size is normal. There is mildly elevated pulmonary artery systolic pressure.  3. The mitral valve is normal in structure. Mild mitral valve regurgitation. No evidence of mitral stenosis.  4. The aortic valve is tricuspid. Aortic valve regurgitation is trivial. No aortic stenosis is present.  5. The inferior vena cava is normal in size with greater than 50% respiratory variability, suggesting right atrial pressure of 3 mmHg. FINDINGS  Left Ventricle: Left ventricular ejection fraction, by estimation, is 60 to 65%. The left ventricle has normal function. The left ventricle has no regional wall motion abnormalities. The left ventricular internal cavity size was normal in size.  There is  mild left ventricular hypertrophy of the basal-septal segment. Left ventricular diastolic parameters are indeterminate. Right Ventricle: The right ventricular size is normal. Right ventricular systolic function is normal. There is mildly elevated pulmonary artery systolic pressure. The tricuspid regurgitant velocity is 3.04 m/s, and with an assumed right atrial pressure of 3 mmHg, the estimated right ventricular systolic pressure is 50.9 mmHg. Left Atrium: Left atrial size was normal in size. Right Atrium: Right atrial size was normal in size. Pericardium: There is no evidence of pericardial effusion. Presence of pericardial fat pad. Mitral Valve: The mitral valve is normal in structure. Mild mitral valve regurgitation. No evidence of mitral valve stenosis. Tricuspid Valve: The tricuspid valve is normal in structure. Tricuspid valve regurgitation is mild . No evidence of tricuspid stenosis. Aortic Valve: The aortic valve is tricuspid. Aortic valve regurgitation is trivial. No aortic stenosis is present. Aortic valve mean gradient measures 5.0 mmHg. Aortic valve peak gradient measures 10.8 mmHg. Aortic valve area, by VTI measures 2.25 cm. Pulmonic Valve: The pulmonic valve was normal in structure. Pulmonic valve regurgitation is not visualized. No evidence of pulmonic stenosis. Aorta: The aortic root is normal in size and structure. Venous: The inferior vena cava is normal in size with greater than 50% respiratory variability, suggesting right atrial pressure of 3 mmHg. IAS/Shunts: No atrial level shunt detected by color flow Doppler.  LEFT VENTRICLE PLAX 2D LVIDd:         4.50 cm     Diastology LVIDs:         3.20 cm     LV e' medial:  9.14 cm/s LV PW:         1.40 cm     LV e' lateral: 8.38 cm/s LV IVS:        1.60 cm LVOT diam:     2.10 cm LV SV:         67 LV SV Index:   34 LVOT Area:     3.46 cm  LV Volumes (MOD) LV vol d, MOD A4C: 36.3 ml LV vol s, MOD A4C: 14.2 ml LV SV MOD A4C:     36.3 ml RIGHT  VENTRICLE             IVC RV S prime:     15.70 cm/s  IVC diam: 1.70 cm TAPSE (M-mode): 2.3 cm LEFT ATRIUM             Index  RIGHT ATRIUM           Index LA diam:        3.00 cm 1.55 cm/m  RA Area:     10.10 cm LA Vol (A2C):   33.2 ml 17.15 ml/m RA Volume:   21.00 ml  10.85 ml/m LA Vol (A4C):   27.8 ml 14.36 ml/m LA Biplane Vol: 31.9 ml 16.48 ml/m  AORTIC VALVE AV Area (Vmax):    2.60 cm AV Area (Vmean):   2.45 cm AV Area (VTI):     2.25 cm AV Vmax:           164.00 cm/s AV Vmean:          107.000 cm/s AV VTI:            0.295 m AV Peak Grad:      10.8 mmHg AV Mean Grad:      5.0 mmHg LVOT Vmax:         123.00 cm/s LVOT Vmean:        75.800 cm/s LVOT VTI:          0.192 m LVOT/AV VTI ratio: 0.65  AORTA Ao Root diam: 2.90 cm Ao Asc diam:  2.60 cm MR Peak grad: 163.3 mmHg  TRICUSPID VALVE MR Vmax:      639.00 cm/s TR Peak grad:   37.0 mmHg                           TR Vmax:        304.00 cm/s                            SHUNTS                           Systemic VTI:  0.19 m                           Systemic Diam: 2.10 cm Kirk Ruths MD Electronically signed by Kirk Ruths MD Signature Date/Time: 07/21/2021/11:43:12 AM    Final      LOS: 2 days   Antonieta Pert, MD [facility]  07/22/2021, 1:14 PM

## 2021-07-22 NOTE — Progress Notes (Signed)
Pharmacy Antibiotic Note  Kristy [name] is a 75 y.o. 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 Patient was initially re-started on nafcillin/rifampin due to cervical hardware and hx of MSSA.  Now with new rash. Pharmacy has been consulted for vancomycin dosing. SCr stable at < 1. WBC 12.3 and patient afebrile   Plan: Vancomycin 1250 mg every 24 hours Predicted AUC 489 with Scr rounded to 1 Monitor renal function and clinical status       Temp (24hrs), Avg:98 F (36.7 C), Min:97.5 F (36.4 C), Max:98.4 F (36.9 C)  Recent Labs  Lab 07/20/21 1754 07/20/21 1820 07/21/21 0500 07/22/21 0324  WBC 12.5*  --  12.6* 12.3*  CREATININE 0.77 0.60 0.78 0.80  LATICACIDVEN 1.3  --   --   --     Estimated Creatinine Clearance: 66.4 mL/min (by C-G formula based on SCr of 0.8 mg/dL).    No Known Allergies    Thank you for allowing pharmacy to be a part of this patient's care.  Sharin Mons, PharmD, BCPS, BCIDP Infectious Diseases Clinical Pharmacist Phone: [phone] 07/22/2021 11:20 AM

## 2021-07-22 NOTE — Consult Note (Addendum)
Consultation Note Date: 07/22/2021   Patient Name: Kristy [name]  DOB: 1946-03-01  MRN: [id number]  Age / Sex: 75 y.o., female  PCP: Lujean Amel, MD Referring Physician: Antonieta Pert, MD  Reason for Consultation: Establishing goals of care, patient and family support  HPI/Patient Profile: 75 y.o. female  with past medical history of polycythemia, CKD 3b, ETOH use, with a recent admission from 8/1 - 9/1.   During that hospitalization she was found to have an epidural abscess involving the cervical spine along with this discitis.  She underwent C5-C6 ACDF on 8/7-cultures were positive for MSSA.  She was subsequently seen by infectious disease-with recommendations to continue IV cefazolin and Ancef until 10/2.  Hospital course was complicated by encephalopathy, dysphagia (PEG tube in place) and recurrent urinary retention (Foley catheter in place).  She was discharged to [facility].  She was admitted to [facility] on 9/10 with SOB and confusion. 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  Spinal imaging showed worsening osteomyelitis discitis due to MSSA at C5-6 C6-7 C7-T1 after ACDF surgery along with epidural abscess from C4-C6 7 with moderate spinal stenosis, and osteomyelitis discitis at T12-L1 with abscess along the right margin of T12-L1 and acute septic arthritis of the SI joint with a soft tissue abscess.   On my initial exam the patient is having delirium (speaking to someone not in the room) and suffering with severe pruritis.  Clinical Assessment and Goals of Care: I have reviewed medical records including EPIC notes, labs and imaging, received report from RN, assessed the patient and then met at the bedside along with patient's husband Eduard Clos and daughter Joelene Millin to discuss diagnosis prognosis, and GOC.  I introduced Palliative Medicine as  specialized medical care for people living with serious illness. It focuses on providing relief from the symptoms and stress of a serious illness. The goal is to improve quality of life for both the patient and the family.  We discussed a brief life review of the patient and then focused on their current illness. Mrs. Lagrange is married with two children.  Her faith is [facility].  She had a career as a Hospital doctor.  Her great love is family and she has 6 grand children. Joelene Millin shares that on June 02, 2021 just before her previous admission the family went out to dinner.  Her mother was walking, talking, functioning normally and completely independently.  Helene Kelp is preparing to discharge Mrs. Muraoka and has told Joelene Millin that her mother is completely dependent.   Joelene Millin asks - how did we get here?   This morning her mother is acutely delusional.  Joelene Millin asks more than once during our conversation for her mother to be transferred for a second opinion.  I read that prior to coming in [place] requested the ambulance driver go to [place].  The admitting physician tried to transfer the patient at family's request but no beds were available at [facility].   Joelene Millin requests again - thru me  to have her mother transferred.  Her preference is to transfer her to [place].  Mrs. Styles's GOCs of care were not discussed in any detail in our initial meeting as the family is quite surprised to learn of Mrs. Nuttall's stroke and condition.  Space and time was created to allow them to express their concerns for Mrs. Si.    Questions and concerns were addressed. The family was encouraged to call with questions or concerns.  PMT will continue to support holistically.    Addendum:  Convened with Drs. Sherlie Ban, and Clinton, then met again with Joelene Millin and Eduard Clos at bedside.  Dr. Maren Beach explained current condition - atelectasis vs pna, possible UTI, spinal infection, change in PONS area on MRI.  Then  Dr. Gale Journey discussed the spinal infection and antibiotic course.  Per Dr. Gale Journey imaging is rarely done at this point in therapy because the infection with go thru and evolutionary process and may continue to look bad for a period of time.  Joelene Millin reiterated that [facility] was planning to discharge her mother from acute rehab and stating that she is fully dependent for care.  Part of the reason Joelene Millin brought her mother back into the hospital seems to be that Joelene Millin was told she would recover and [facility] is now discharging her stating she is fully dependent.     Joelene Millin requested a transfer to [facility] for a second opinion.  As it stands today - Mrs. Strohm may need a higher level of care for a longer (indefinite) period of time until her infection heals, and she is able to do therapy - or until it is determined that her infection can not heal - then other decisions can be made.  Primary Decision Maker:  NEXT OF KIN, by law her husband is her surrogate unless she has HCPOA paperwork.   Eduard Clos appears to defer to [place].    SUMMARY OF RECOMMENDATIONS   Family would benefit from a comprehensive update on Mrs. Stirn's condition.  I discussed with Dr. Maren Beach and recommended a multidisciplinary meeting. Family has requested a transfer to another hospital system for a second opinion. Appreciate WOC recs on skin condition. Delirium - would consider low dose olanzapine (start at 2.5 mg qhs).  QTC 429. Intense pruritis - determine the cause if possible.  Hydroxyzine PRN for moderate, diphenhydramine IV for severe, emollient lotion.  Further work up is being considered for infection in the CSF.  Barring this,  Mrs. Tagle may need a higher level of care than SNF for a longer period of time until her infection heals, and she is able to do therapy - or until it is confirmed that her infection can not heal - then other decisions may be made.    Code Status/Advance Care Planning: Not discussed.   Currently full code.  Additional Recommendations (Limitations, Scope, Preferences): Full Scope Treatment  Palliative Prophylaxis:  Delirium Protocol  Psycho-social/Spiritual:  Desire for further Chaplaincy support:  Prognosis:  Difficult to determine.  Hopefully acute delirium is reversible. There may be underlying damage from stroke/dementia/infection.   I'm concerned that her extensive spinal osteomyelitis/discitis/septic arthritis will be difficult heal and she will be left with lasting damage to her spine.  Discharge Planning:  Hopefully:   [place] for rehab with Palliative care service follow-up      Primary Diagnoses: Present on Admission: **None**   I have reviewed the medical record, interviewed the patient and family, and examined the patient. The following  aspects are pertinent.  History reviewed. No pertinent past medical history. Social History   Socioeconomic History   Marital status: Married    Spouse name: Not on file   Number of children: Not on file   Years of education: Not on file   Highest education level: Not on file  Occupational History   Not on file  Tobacco Use   Smoking status: Unknown   Smokeless tobacco: Not on file  Substance and Sexual Activity   Alcohol use: Not on file   Drug use: Not on file   Sexual activity: Not on file  Other Topics Concern   Not on file  Social History Narrative   Not on file   Social Determinants of Health   Financial Resource Strain: Not on file  Food Insecurity: Not on file  Transportation Needs: Not on file  Physical Activity: Not on file  Stress: Not on file  Social Connections: Not on file   No family history on file.  No Known Allergies  Review of Systems Unable to assess due to patient's mental status.  Vital Signs: BP (!) 122/109 (BP Location: Right Arm)   Pulse 98   Temp 97.8 F (36.6 C) (Oral)   Resp 20   SpO2 100%  Pain Scale: PAINAD   Pain Score: 8    SpO2:  SpO2: 100 % O2 Device:SpO2: 100 % O2 Flow Rate: .     Palliative Assessment/Data: 20%    Patient on D1 diet with PEG feedings     Time In: 9:00 Time Out: 10:00 Time Total: 2:00 Time Out:  2:41 Total time 101 min. Visit consisted of counseling and education dealing with the complex and emotionally intense issues surrounding the need for palliative care and symptom management in the setting of serious and potentially life-threatening illness. Greater than 50%  of this time was spent counseling and coordinating care related to the above assessment and plan.  Signed by: Florentina Jenny, PA-C Palliative Medicine  Please contact Palliative Medicine Team phone at [phone] for questions and concerns.  For individual provider: See Shea Evans

## 2021-07-22 NOTE — Progress Notes (Signed)
During shift report, pt generalized dry flaky white  rash, weeping, entire body, screaming and yelling at Rns to treat severe itching, scratched neck, RN applied barrier cream, tx with PRN hydroxyzine. Will continue to monitor. MD notified.

## 2021-07-22 NOTE — Plan of Care (Signed)
Received call from [facility] transfer Center- Spoke w/ Dr Collie Siad  [facility]- 2/2 bed constraints/ family request- transfer declined  as specialist neuro, neuros sx, ID Paliiaitive available here and he felt not much to add for medical necissity standpoint. He mentioned 45 patients in waiting at [facility]. Advised high dose thiamine given etoh history.

## 2021-07-22 NOTE — Consult Note (Signed)
WOC Nurse Consult Note: Reason for Consult:Dry, weeping skin.  This is noted at SNF as well and treating with barrier creams and hydroxyzine.  Stage 2 sacral wound Wound type:Exfoliative dermatitis and pressure Pressure Injury POA: Yes Measurement: 2 cm x 1 cm x 0.1 cm  Wound HWE:XHBZ and moist Large plaques of light, weeping skin. Tender to touch.  Drainage (amount, consistency, odor) weeping Periwound:dry skin Dressing procedure/placement/frequency:Bathe daily with soap and water and pat dry. Apply Eucerin cream to trunk and extremities each shift and PRN dry skin.  Continue hydroxyzine for itching.  Sacral foam to breakdown on sacrum. Change every three days and PRN soilage.  Will not follow at this time.  Please re-consult if needed.  Maple Hudson MSN, RN, FNP-BC CWON Wound, Ostomy, Continence Nurse Pager [phone]

## 2021-07-22 NOTE — Progress Notes (Signed)
STROKE TEAM PROGRESS NOTE   INTERVAL HISTORY Patient is cervical epidural abscess following surgery from C2-T3/T4 s/p ACDF on 06/16/2021 with blood cultures positive for MSSA and prolonged hospital course complicated by encephalopathy and dysphagia and recurrent UTI requiring PEG tube placement.  Patient has prolonged encephalopathy and family is concerned that Kristy [name] had increased confusion and generalized weakness prompting an MRI scan which I personally reviewed shows weak diffusion positive lesion in the midline and central pons which is also seen on corresponding T2 and FLAIR images and is likely of subacute nature.  Postcontrast MRI was not done.  CT angiogram of the brain and neck does not show significant large vessel intra or extracranial stenosis patient neurological exam is nonfocal without significant weakness or complaints of subjective diplopia..     Vitals:   07/21/21 1434 07/21/21 1528 07/21/21 2000 07/22/21 0907  BP:  (!) 111/96 (!) 144/95 (!) 122/109  Pulse:  (!) 104 87 98  Resp:  18 18 20   Temp: 98.4 F (36.9 C) 98.2 F (36.8 C) (!) 97.5 F (36.4 C) 97.8 F (36.6 C)  TempSrc: Oral Oral Oral Oral  SpO2:  100% 97% 100%   CBC:  Recent Labs  Lab 07/20/21 1754 07/20/21 1820 07/21/21 0500 07/22/21 0324  WBC 12.5*  --  12.6* 12.3*  NEUTROABS 8.8*  --   --   --   HGB 8.0*   < > 7.9* 8.7*  HCT 26.0*   < > 25.2* 28.4*  MCV 100.4*  --  99.6 100.4*  PLT 212  --  204 217   < > = values in this interval not displayed.   Basic Metabolic Panel:  Recent Labs  Lab 07/21/21 0500 07/22/21 0324  NA 135 136  K 3.8 3.9  CL 104 106  CO2 21* 20*  GLUCOSE 86 71  BUN 21 21  CREATININE 0.78 0.80  CALCIUM 8.6* 8.7*    Lipid Panel:  Recent Labs  Lab 07/21/21 0500  CHOL 137  TRIG 100  HDL 29*  CHOLHDL 4.7  VLDL 20  LDLCALC 88    HgbA1c:  Recent Labs  Lab 07/21/21 0500  HGBA1C 5.1   Urine Drug Screen: No results for input(s): LABOPIA, COCAINSCRNUR, LABBENZ, AMPHETMU,  THCU, LABBARB in the last 168 hours.  Alcohol Level No results for input(s): ETH in the last 168 hours.  IMAGING past 24 hours No results found.  PHYSICAL EXAM  Neurological exam Awake alert oriented to self.  Kristy [name] knows Kristy [name] is in the hospital but cannot tell me the correct name Able to name objects.  Mildly dysarthric.  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  Face appears grossly symmetric.  Palate is symmetric but the elevation is incomplete.  Tongue is midline. Motor exam: Kristy [name] is able to move all 4 extremities but is barely 3/5 in all 4 extremities but symmetric without focal weakness.  Kristy [name] barely could be antigravity so I would rate her strength in all extremities proximally to be 2/5. Sensation: Intact to touch Coordination-cannot assess due to the above  ASSESSMENT/PLAN Kristy [name] is a 75 y.o. female with history of   history of hypertension, polycythemia, alcohol use disorder, recent cervical epidural abscess C2-T3 T4 status post ACDF on 06/16/2021 by neurosurgery with cultures positive for MSSA,  hospital course complicated by dysphagia and encephalopathy, recurrent UTI, PEG tube placement, brought from a facility where the family when visiting the patient and found her to be more short of breath and more confused than usual.  History is obtained from the chart as patient is unable to provide reliable history.  Presumably, the daughter went to see the patient and Kristy [name] was not acting like her self.  Kristy [name] was also weak all over and all 4 extremities, which prompted call to EMS who brought her to the ER for further evaluation.  Unclear last known well. Patient was not able to provide much meaningful history by herself.  No family was present at the time of this  encounter.   Kristy [name] was admitted in August after sustaining multiple falls.  Kristy [name] was noted to be very encephalopathic.  Kristy [name] was evaluated by neurology and plan was for a lumbar puncture given nuchal rigidity and confusion.  Further imaging revealed that Kristy [name] had a spinal epidural abscess which required emergent operative treatment by neurosurgery.  Kristy [name] had a prolonged hospital course complicated by dysphagia, encephalopathy, recurrent UTIs.  Eventually placed on PEG tube and transferred to a SNF.  At baseline encephalopathic to the point where Kristy [name] is only oriented to self according to chart review.    Mid pontine lesion of questionable etiology subacute infarct versus infected emboli in the setting of epidural abscess.   CT head suspicious for acute to subacute pontine infarct, new compared from august. Atrophy and small vessel changes.    CTA head & neck  negative for large vessel occlusion.  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 2D Echo LVEF 60-65%, mild LVH, normal size LA, no IA shunt LDL 88 HgbA1c 5.1 VTE prophylaxis - scd     Diet   DIET - DYS 1 Room service appropriate? Yes; Fluid consistency: Thin   No antithrombotic prior to admission, now on aspirin 81 mg daily.   Therapy recommendations:  pending Disposition:  pending     Hyperlipidemia Home meds:  none  documented,  LDL 88, goal < 70 Add lipitor 40mg   High intensity statin   Continue statin at discharge    SSI  Other Stroke Risk Factors  Advanced Age >/= 59   Hx stroke/TIA   Other Active Problems  Metabolic encephalopathy-likely at baseline, patient is oriented to self only.  We will follow results of MRI brain. Community-acquired pneumonia-concern for pneumonia on CT chest, patient has been started on vancomycin and cefepime.  Follow blood culture results. Probable CAUTI-patient has abnormal UA, Kristy [name] has a Foley catheter in place for recurrent urinary retention.  Urine culture  has been obtained.  We will follow the results of culture. Nutrition-patient has PEG tube in place, will continue Osmolite at 65 mill per hour.  125 mL water every 4 hours via tube. Cervical epidural abscess-s/p C5-C6 ACDF on 8/7, cultures were positive for MSSA, patient was started on cefazolin and rifampin till 08/11/2021.  We will hold cefazolin and rifampin at this time as patient has been started on vancomycin and cefepime as above. T12/L1 dystrophic changes-CT abdomen/pelvis showed T12/L1 dystrophic changes, MRI lumbar spine ordered.  We will follow the results.    I have personally obtained history,examined this patient, reviewed notes, independently viewed imaging studies, participated in medical decision making and plan of care.ROS completed by me personally and pertinent positives fully documented  I have made any additions or clarifications directly to the above note. Agree  with note above.  Patient has had a prolonged hospital stay for for recurrent falls related to spinal epidural abscess requiring surgery and hospital course has been complicated by encephalopathy and recurrent UTIs.  Abnormal MRI scan of the brain shows mid pontine lesion which appears to be subacute not sure whether this is a infarct versus infected emboli versus direct extension of infection cervical spine to the the skull base affecting penetrating arteries resulting in stroke.  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 Continue antibiotics as per ID team.  Recommend MRI scan of the brain with contrast to look for basilar meningitis as possible extension from cervical epidural abscess and if this is found consider spinal tap or tissue biopsy to make sure the patient's antibiotic course was appropriate targeted towards staff and Kristy [name] does not have  any other infection.  Expect slow gradual recovery with antibiotics and ongoing therapies.  No family available at this time for discussion.  Discussed with Dr. Lanae Boast and Dr. Renold Don infectious disease.  Greater than 50% time during this 35-minute visit were spent on counseling and coordination of care and discussion with care team and answering questions.  Delia Heady, MD Medical Director [facility] Stroke Center Pager: [phone] 07/22/2021 4:59 PM      To contact Stroke Continuity provider, please refer to [email or web address]. After hours, contact General Neurology

## 2021-07-22 NOTE — Plan of Care (Signed)

## 2021-07-23 ENCOUNTER — Inpatient Hospital Stay (HOSPITAL_COMMUNITY): Payer: Medicare Other

## 2021-07-23 ENCOUNTER — Inpatient Hospital Stay: Payer: Self-pay | Admitting: Infectious Diseases

## 2021-07-23 DIAGNOSIS — G9341 Metabolic encephalopathy: Secondary | ICD-10-CM

## 2021-07-23 DIAGNOSIS — Z7189 Other specified counseling: Secondary | ICD-10-CM

## 2021-07-23 DIAGNOSIS — J189 Pneumonia, unspecified organism: Secondary | ICD-10-CM

## 2021-07-23 LAB — CBC
HCT: 25.1 % — ABNORMAL LOW (ref 36.0–46.0)
Hemoglobin: 8 g/dL — ABNORMAL LOW (ref 12.0–15.0)
MCH: 31.5 pg (ref 26.0–34.0)
MCHC: 31.9 g/dL (ref 30.0–36.0)
MCV: 98.8 fL (ref 80.0–100.0)
Platelets: 223 10*3/uL (ref 150–400)
RBC: 2.54 MIL/uL — ABNORMAL LOW (ref 3.87–5.11)
RDW: 16.9 % — ABNORMAL HIGH (ref 11.5–15.5)
WBC: 8.7 10*3/uL (ref 4.0–10.5)
nRBC: 0 % (ref 0.0–0.2)

## 2021-07-23 LAB — GLUCOSE, CAPILLARY
Glucose-Capillary: 88 mg/dL (ref 70–99)
Glucose-Capillary: 107 mg/dL — ABNORMAL HIGH (ref 70–99)
Glucose-Capillary: 94 mg/dL (ref 70–99)
Glucose-Capillary: 102 mg/dL — ABNORMAL HIGH (ref 70–99)
Glucose-Capillary: 98 mg/dL (ref 70–99)
Glucose-Capillary: 82 mg/dL (ref 70–99)

## 2021-07-23 LAB — COMPREHENSIVE METABOLIC PANEL
ALT: 5 U/L (ref 0–44)
AST: 16 U/L (ref 15–41)
Albumin: 1.4 g/dL — ABNORMAL LOW (ref 3.5–5.0)
Alkaline Phosphatase: 103 U/L (ref 38–126)
Anion gap: 11 (ref 5–15)
BUN: 27 mg/dL — ABNORMAL HIGH (ref 8–23)
CO2: 20 mmol/L — ABNORMAL LOW (ref 22–32)
Calcium: 8.1 mg/dL — ABNORMAL LOW (ref 8.9–10.3)
Chloride: 105 mmol/L (ref 98–111)
Creatinine, Ser: 1.01 mg/dL — ABNORMAL HIGH (ref 0.44–1.00)
GFR, Estimated: 58 mL/min — ABNORMAL LOW (ref >60)
Glucose, Bld: 101 mg/dL — ABNORMAL HIGH (ref 70–99)
Potassium: 3.7 mmol/L (ref 3.5–5.1)
Sodium: 136 mmol/L (ref 135–145)
Total Bilirubin: 1.7 mg/dL — ABNORMAL HIGH (ref 0.3–1.2)
Total Protein: 6.3 g/dL — ABNORMAL LOW (ref 6.5–8.1)

## 2021-07-23 IMAGING — MR MR HEAD W/ CM
3 series · 27 of 48 positions shown · IV contrast (g GAD)
Comparison: MRI of the brain [DATE].

CLINICAL DATA: Meningitis/CNS infection suspected.

EXAM:
MRI HEAD WITH CONTRAST
TECHNIQUE: Multiplanar, multiecho pulse sequences of the brain and surrounding
structures were obtained with intravenous contrast.
CONTRAST:  7.5mL GADAVIST GADOBUTROL 1 MMOL/ML IV SOLN

[Series 2: T1 · axial · 3.0mm · 0.94mm/px · z∈[-13,+105]mm · 11 of 48 slices shown (1 of 2)]
[im 3/48]
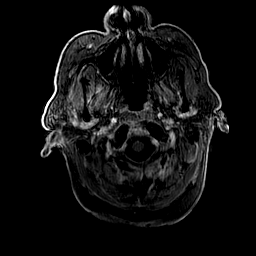
[im 7/48]
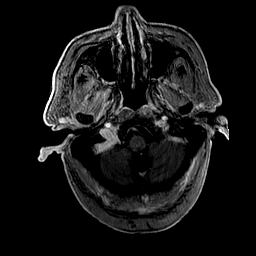
[im 9/48]
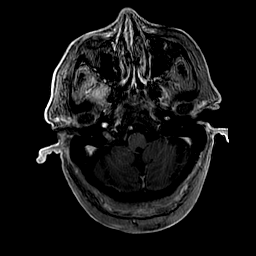
[im 15/48]
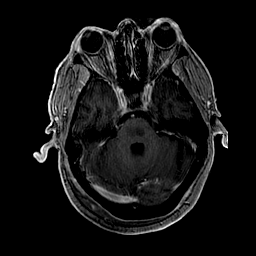
[im 22/48]
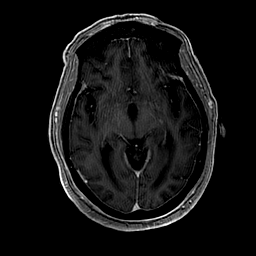
[im 24/48]
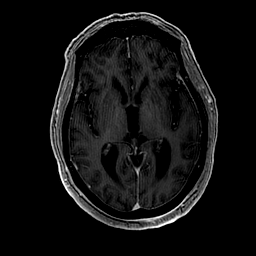
[im 26/48]
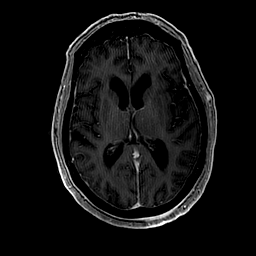
[im 33/48]
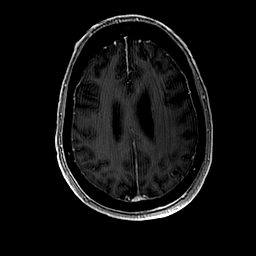
[im 39/48]
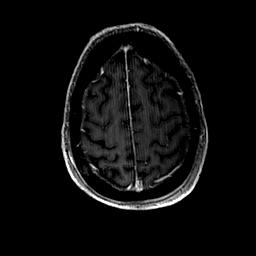
[im 41/48]
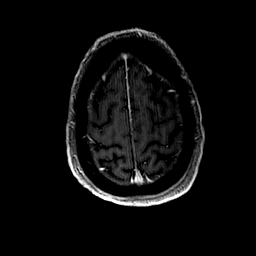
[im 45/48]
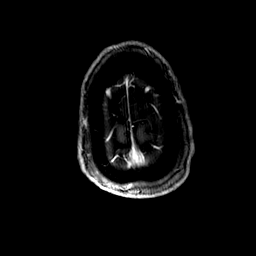

[Series 3: T1 · coronal · 5.0mm · 0.43mm/px · 7 of 27 slices shown (2 of 2)]
[im 1/27]
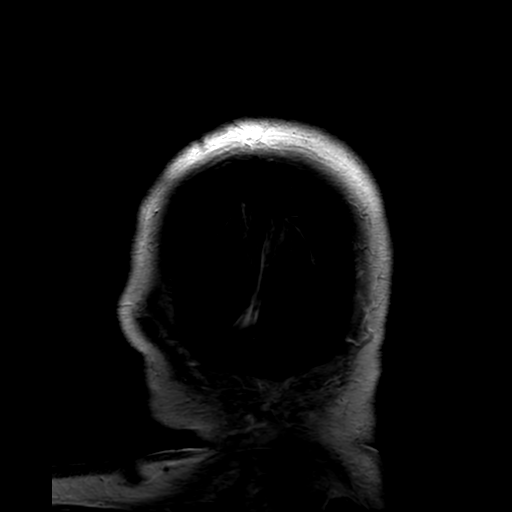
[im 5/27]
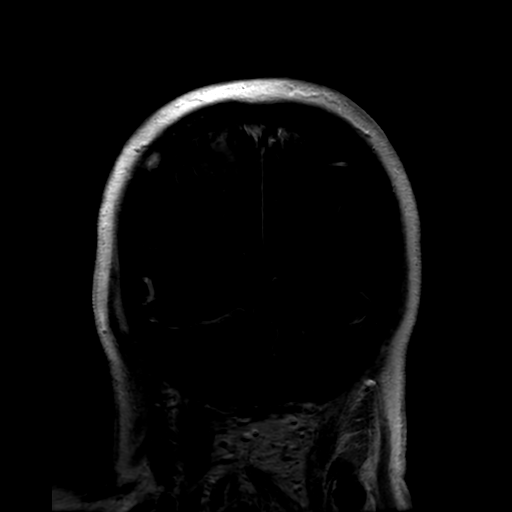
[im 9/27]
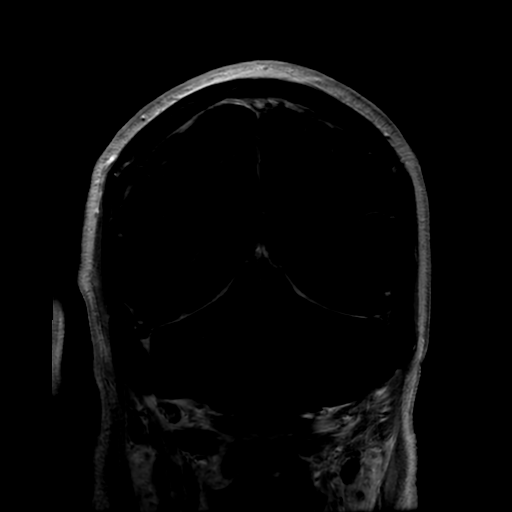
[im 11/27]
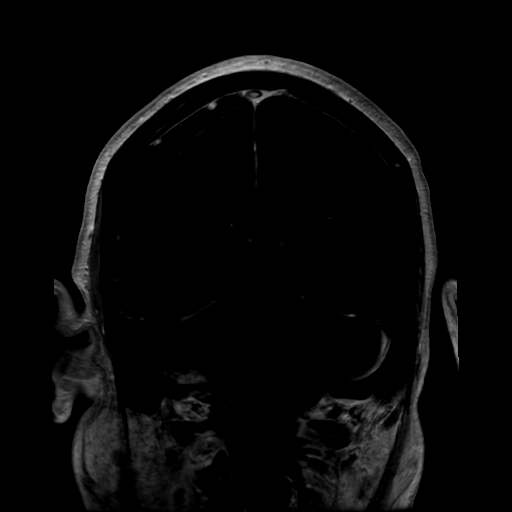
[im 14/27]
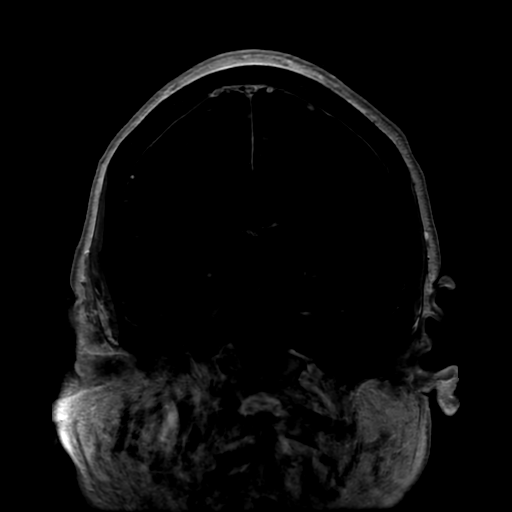
[im 16/27]
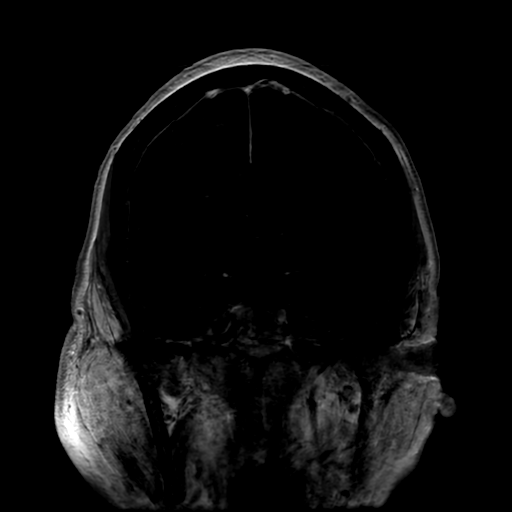
[im 22/27]
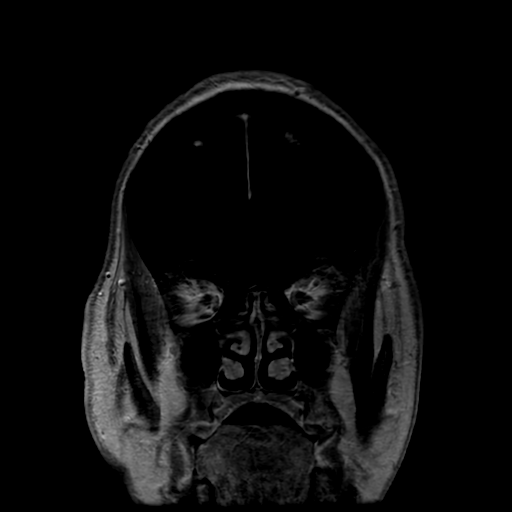

[Series 4: FLAIR post-contrast · sagittal · 5.0mm · 0.47mm/px · 9 of 24 slices shown]
[im 1/24]
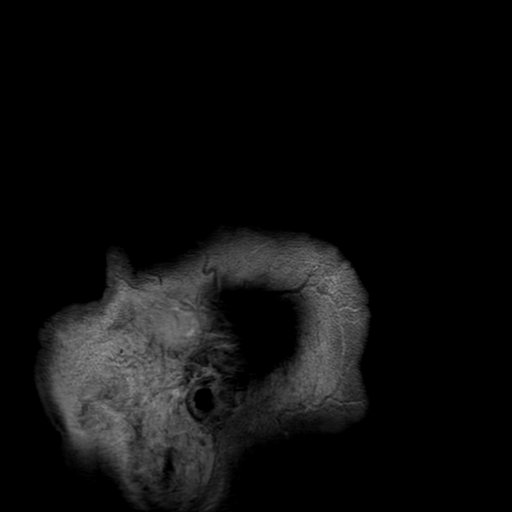
[im 5/24]
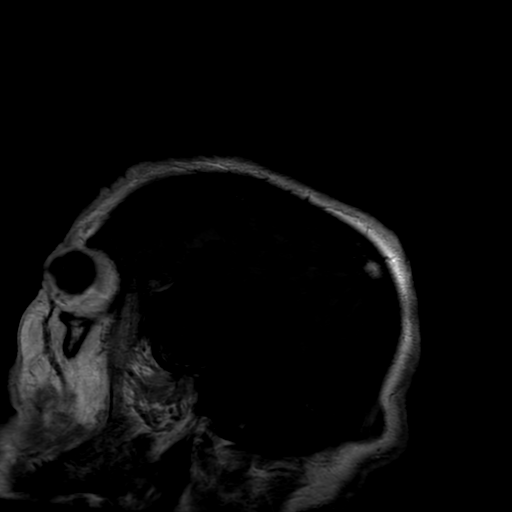
[im 7/24]
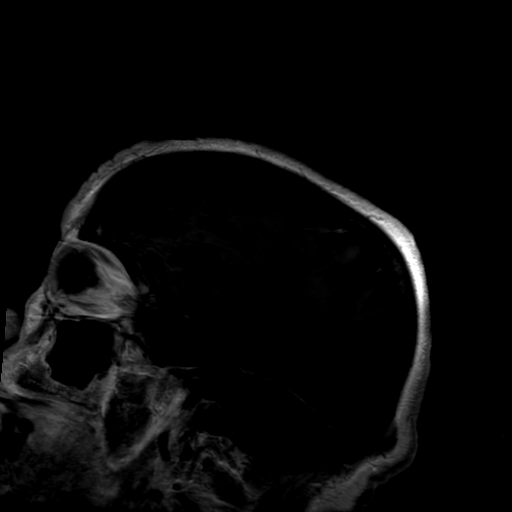
[im 11/24]
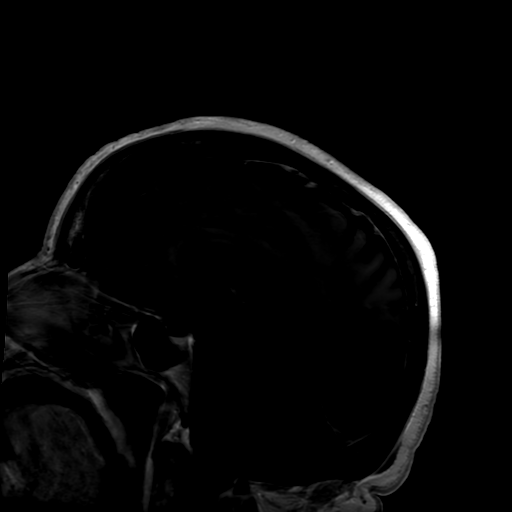
[im 13/24]
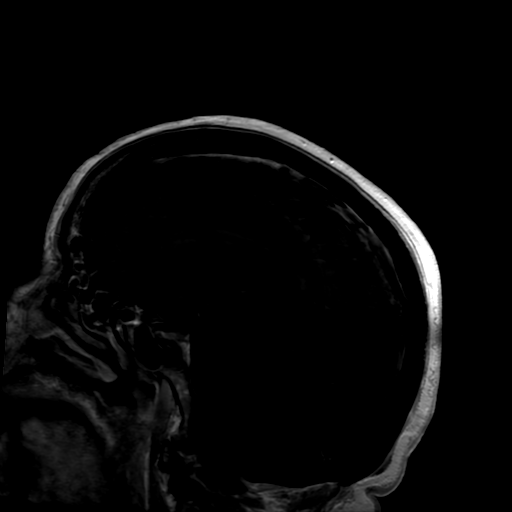
[im 17/24]
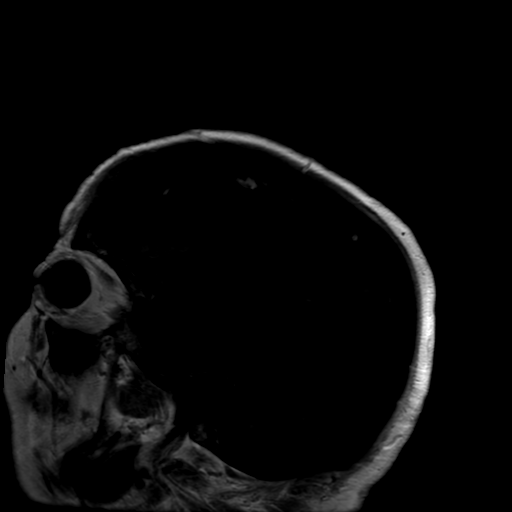
[im 19/24]
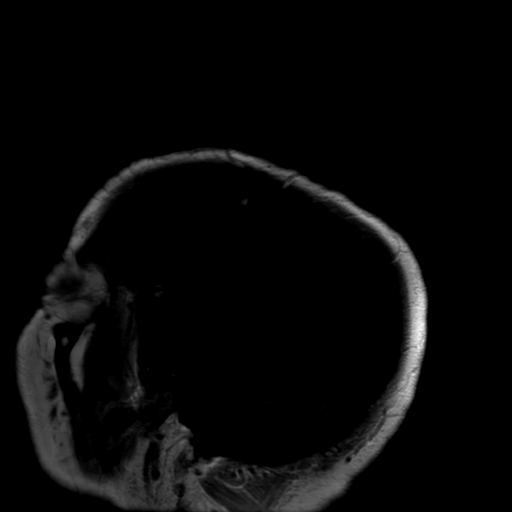
[im 21/24]
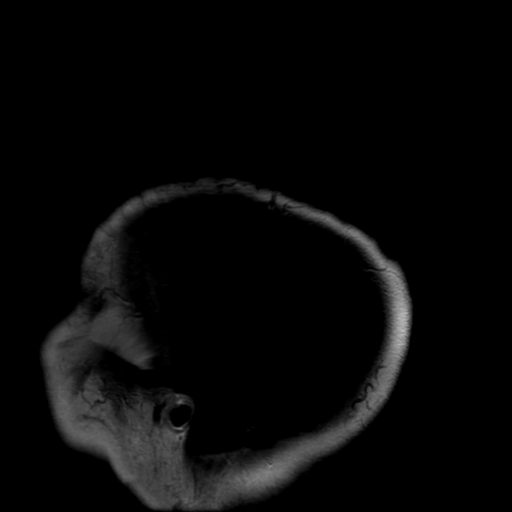
[im 24/24]
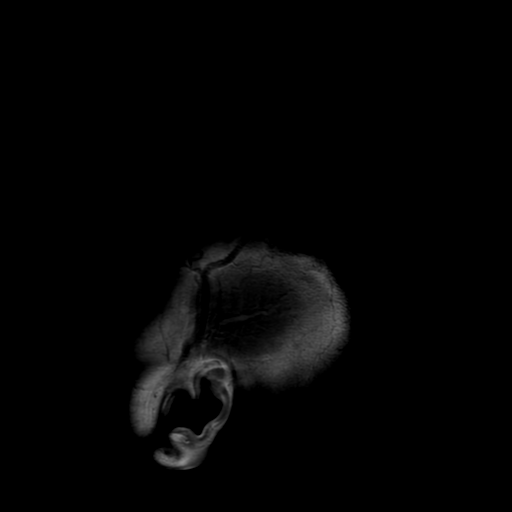

[27 of 48 positions shown; findings below may reference images not displayed]

FINDINGS: Postcontrast images show no evidence of parenchymal or
leptomeningeal focus of abnormal contrast.
IMPRESSION: No intracranial focus of abnormal contrast enhancement.

## 2021-07-23 MED ORDER — LACTATED RINGERS IV SOLN: INTRAVENOUS | Status: DC

## 2021-07-23 MED ORDER — ENOXAPARIN SODIUM 40 MG/0.4ML IJ SOSY
40.0000 mg | PREFILLED_SYRINGE | INTRAMUSCULAR | Status: DC
  Administered 2021-07-23 – 2021-08-05 (×14): 40 mg via SUBCUTANEOUS
  Filled 2021-07-23 (×14): qty 0.4

## 2021-07-23 MED ORDER — SODIUM CHLORIDE 0.9% FLUSH
10.0000 mL | Freq: Two times a day (BID) | INTRAVENOUS | Status: DC
  Administered 2021-07-23 – 2021-08-06 (×25): 10 mL

## 2021-07-23 MED ORDER — SODIUM CHLORIDE 0.9% FLUSH: 10.0000 mL | INTRAVENOUS | Status: DC | PRN

## 2021-07-23 MED ORDER — GADOBUTROL 1 MMOL/ML IV SOLN
7.5000 mL | Freq: Once | INTRAVENOUS | Status: AC | PRN
  Administered 2021-07-23: 7.5 mL via INTRAVENOUS

## 2021-07-23 NOTE — Progress Notes (Signed)
SLP Cancellation Note  Patient Details Name: Kristy [name] MRN: [id number] DOB: 06-19-1946   Cancelled treatment:       Reason Eval/Treat Not Completed: Patient at procedure or test/unavailable. Unable to assess diet tolerance or complete SLE at this time, as pt is currently off unit for testing. Will continue efforts.   Breck Maryland B. Murvin Natal, [facility], CCC-SLP Speech Language Pathologist Office: [phone]  [name] 07/23/2021, 1:52 PM

## 2021-07-23 NOTE — Progress Notes (Addendum)
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 She was brought from the nursing facility due to shortness of breath, more confusion than usual baseline . In the ED: CT head showed acute/subacute possible pontine infarct, CT abdomen pelvis showed dystrophic changes inferior endplate of T12, L1 vertebral body, narrowing of the intervening intervertebral disc and a small amount of soft tissue thickening anteriorly, new finding compared to previous CTs and may represent sequelae of discitis/osteomyelitis, MRI lumbar C-spine was ordered and lab work showed leukocytosis 12.5, CT chest small amount of airspace consolidation versus atelectasis in the bilateral lung bases. 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  9/11 Neurosurgery and ID was consulted-neurosurgery did not advise acute  surgical intervention. Seen by speech and  placed on pured diet 9/13:Repeat MRI brain w/ contrast:no intracranial focus of abnormal contrast.  Subjective: Seen and examined this morning Overnight afebrile. Labs showed improved WBC count. 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 Maurice Small from neurosurgery-do not advise any surgery at this time,ID is following. Continue pain control added prn IV Toradol few doses only  (in order to minimize opiates due to her encephalopathy ) Further work-up per ID/neurology.  Blood cul.tures no growth so far.  Urine culture pending.  Keeping on Nafcillin for now for better CNS penetration.   Recent Labs  Lab 07/20/21 1754 07/21/21 0500 07/22/21 0324 07/23/21 0352  WBC 12.5* 12.6* 12.3* 8.7  LATICACIDVEN 1.3  --   --   --    Acute Metabolic encephalopathy: Appears to have subacute encephalopathy, mental state has cleared some on discharge on 9/1 and w/ intermittent confusion.  Here following simple commands however about self or date of birth and that she is in the hospital. Reportedly more confused at the facility and brought to the ED.Likely multifactorial in the  setting of her persistent spine infection question CAUTI and due to her deconditioning.  Given  history of alcohol use we will try high-dose iv thiamine, cont her bedime Zyprexa.Neurology on board  and input appreciated  Abnormal non contrast MRI 1.5 cm : ?? 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 Neuro completed stroke w/u with up with A1c 5.1 , lipid panel  ldl 88, goal < 70,CT angio head and neck no LVO. Plan for  ASA 81 mg, Lipitor 40 mg.Echo with EF 60 to 65%, no R WMA, normal mitral valve, aortic valve with mild AR.  CKD stage II- recent AKI- creat stable. Recent Labs  Lab 07/20/21 1754 07/20/21 1820 07/21/21 0500 07/22/21 0324 07/23/21 0352  BUN 26* 24* 21 21 27*  CREATININE 0.77 0.60 0.78 0.80 1.01*    ? CAUTI Recurrent urine retention needing Foley catheter  sincne last admission On admission abnormal UA  wbc 21-50, LE moderate dn nitrite +on antibiotics, follow-up culture.  ?Pneumonia vs atelectasis:CT chest a small amount of airspace consolidation versus atelectasis in the bilateral lung base.  No obvious respiratory symptoms less likely pneumonia.  Will benefit with incentive spirometry once more oriented.  But at risk of developing pneumonia given her bedbound status.  FEN/PEG tube in place continue tube feeding.  Speech has seen the patient and started on dysphagia 1/pured diet, RD consulted to adjust tube feeds.  Continue supportive care. Moderate Protein Calorie malnutrition: low albumin-continue to augment nutritional status has very low albumin level.   Hypoglycemia on tube feeding and p.o. diet.  Off D5 water . Recent Labs  Lab 07/22/21 1951 07/22/21 2325 07/23/21 0332 07/23/21 0746 07/23/21 1133  GLUCAP 115* 89 88 107* 94    Mildly elevated bilirubin:Monitor  Chronic dermatitis/chronic itching continue Atarax added Benadryl.  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 ID on board - rash worsening-could be from  rifampin  to stopped  Hypertension blood pressure stable she has been on midodrine PTA, will continue the same   Deconditining Ambulaltory dysfunction Functional quadriplegia: In the setting of discitis osteomyelitis, back pain, physical deconditioning and encephalopathy.  PT OT as tolerated.  Continue pain control ,likely contributing to her ambulatory dysfunction  Chronic anemia Epistaxis on last admission where ENT  Dr Ezzard Standing - cauterized bleeding vessels in the right anterior nostril and aspirin/prophylactic heparin was stopped at that time and patient also had thrombocytopenia and anemia hemoglobin w/ hb up to 6.7 gmin the setting of epistaxis needing 2 unit transfusion. Monitor h/h as below. Recent Labs  Lab 07/20/21 1754 07/20/21 1820 07/21/21 0500 07/22/21 0324 07/23/21 0352  HGB 8.0* 8.2* 7.9* 8.7* 8.0*  HCT 26.0* 24.0* 25.2* 28.4* 25.1*    Goals of care: Currently full code.Given  her infectious process, encephalopathy, co-morbidities palliative care consult appreciated on board.  Of note: Patient was independent pror to last admission and only health issues was : heavy alcohol use" and now severely debilitated, daughter and family wants to pursue full aggressive care.  FEN: On dysphagia diet, Tube Feeds, encourage intake, augment diet.  Attempted transfer EDP called [facility] but no beds so admitted to [facility].  Per daughter request called [facility] transfer line 9/12 evening and was  called by by Dr Collie Siad and NOT accepted for transfer. Diet Order             DIET - DYS 1  Room service appropriate? Yes; Fluid consistency: Thin  Diet effective now                   Patient's There is no height or weight on file to calculate BMI. DVT prophylaxis: enoxaparin (LOVENOX) injection 40 mg Start: 07/23/21 1545 SCDs Start: 07/21/21 0106. Lovenox will added given her lack of mobility/high risk of VTE Code Status:   Code Status: Full Code  Family Communication: plan of care  discussed with patient and family at bedside 9/12.  Called daughter and updated today.  She wants to pursue full aggressive care  Status is: Inpatient Remains inpatient appropriate because:Ongoing diagnostic testing needed not appropriate for outpatient work up, IV treatments appropriate due to intensity of illness or inability to take PO, and Inpatient level of care appropriate due to severity of illness Dispo: The patient is from: SNF              Anticipated d/c is to:TBD              Patient currently is not medically stable to d/c.   Difficult to place patient No  Unresulted Labs (From admission, onward)     Start     Ordered   07/30/21 0500  Creatinine, serum  (enoxaparin (LOVENOX)    CrCl >/= 30 ml/min)  Weekly,   R     Comments: while on enoxaparin therapy   Question:  Specimen collection method  Answer:  Lab=Lab collect   07/23/21 1456   07/23/21 1457  CBC  (enoxaparin (LOVENOX)    CrCl >/= 30 ml/min)  Once,   R       Comments: Baseline for enoxaparin therapy IF NOT ALREADY DRAWN.  Notify MD if PLT < 100 K.   Question:  Specimen collection method  Answer:  Lab=Lab collect   07/23/21 1456   07/23/21 1457  Creatinine, serum  (enoxaparin (LOVENOX)    CrCl >/= 30 ml/min)  Once,   R       Comments: Baseline for enoxaparin therapy IF NOT ALREADY DRAWN.   Question:  Specimen collection method  Answer:  Lab=Lab collect   07/23/21 1456   07/22/21 0500  Comprehensive metabolic panel  Daily,   R      07/21/21 1140   07/22/21 0500  CBC  Daily,   R      07/21/21 1140          Medications reviewed: Scheduled Meds:   stroke: mapping our early stages of recovery book   Does not apply Once   acetaminophen  650 mg Per Tube Q6H   allopurinol  100 mg Per Tube Daily   aspirin EC  81 mg Oral Daily   atorvastatin  40 mg Oral QHS   Chlorhexidine Gluconate Cloth  6 each Topical Daily   diclofenac Sodium  4 g Topical TID AC & HS   enoxaparin (LOVENOX) injection  40 mg Subcutaneous Q24H    feeding supplement (PROSource TF)  45 mL Per Tube TID   free water  125 mL Per Tube Q4H   lubriderm seriously sensitive   Topical BID   midodrine  5 mg Per Tube TID WC   OLANZapine  2.5 mg Oral QHS   [START ON 07/25/2021] thiamine injection  100 mg Intravenous Q8H   triamcinolone 0.1 % cream : eucerin   Topical BID   Continuous Infusions:  feeding supplement (OSMOLITE 1.5 CAL) 1,000 mL (07/23/21 0617)   lactated ringers  nafcillin IV 2 g (07/23/21 1428)   thiamine injection 400 mg (07/23/21 1429)   Consultants:see note  Procedures:see note Antimicrobials: Anti-infectives (From admission, onward)    Start     Dose/Rate Route Frequency Ordered Stop   07/22/21 2200  nafcillin 2 g in sodium chloride 0.9 % 100 mL IVPB        2 g 200 mL/hr over 30 Minutes Intravenous Every 4 hours 07/22/21 1320     07/22/21 1215  vancomycin (VANCOREADY) IVPB 1250 mg/250 mL  Status:  Discontinued        1,250 mg 166.7 mL/hr over 90 Minutes Intravenous Every 24 hours 07/22/21 1120 07/22/21 1320   07/21/21 1700  rifampin (RIFADIN) 300 mg in sodium chloride 0.9 % 100 mL IVPB  Status:  Discontinued        300 mg 200 mL/hr over 30 Minutes Intravenous Every 12 hours 07/21/21 1327 07/22/21 1014   07/21/21 1600  nafcillin 2 g in sodium chloride 0.9 % 100 mL IVPB  Status:  Discontinued        2 g 200 mL/hr over 30 Minutes Intravenous Every 4 hours 07/21/21 1455 07/22/21 1014   07/21/21 1330  nafcillin injection 2 g  Status:  Discontinued        2 g Intravenous Every 4 hours 07/21/21 1326 07/21/21 1455   07/21/21 0000  vancomycin (VANCOCIN) IVPB 1000 mg/200 mL premix  Status:  Discontinued        1,000 mg 200 mL/hr over 60 Minutes Intravenous Every 12 hours 07/20/21 2156 07/21/21 1326   07/20/21 2200  ceFEPIme (MAXIPIME) 2 g in sodium chloride 0.9 % 100 mL IVPB  Status:  Discontinued        2 g 200 mL/hr over 30 Minutes Intravenous Every 8 hours 07/20/21 2143 07/21/21 1326   07/20/21 2145  vancomycin  (VANCOREADY) IVPB 1750 mg/350 mL        1,750 mg 175 mL/hr over 120 Minutes Intravenous  Once 07/20/21 2143 07/21/21 0308      Culture/Microbiology    Component Value Date/Time   SDES URINE, CATHETERIZED 07/22/2021 1737   SPECREQUEST NONE 07/22/2021 1737   CULT (A) 07/22/2021 1737    30,000 COLONIES/mL GRAM NEGATIVE RODS 40,000 COLONIES/mL YEAST SUSCEPTIBILITIES TO FOLLOW Performed at [facility], [address] [place], [place] [address]    REPTSTATUS PENDING 07/22/2021 1737    Other culture-see note  Objective: Vitals: Today's Vitals   07/22/21 2315 07/23/21 0320 07/23/21 0743 07/23/21 1130  BP: (!) 100/57 (!) 101/51 (!) 115/91 124/62  Pulse: 92 97 (!) 105 (!) 101  Resp: 16 16 17 19   Temp: 98.3 F (36.8 C) 98.2 F (36.8 C) 99.7 F (37.6 C) 98.8 F (37.1 C)  TempSrc: Oral Oral Axillary Axillary  SpO2: 97% 98% 99% 100%  PainSc: 4  7       Intake/Output Summary (Last 24 hours) at 07/23/2021 1456 Last data filed at 07/23/2021 0649 Gross per 24 hour  Intake --  Output 1000 ml  Net -1000 ml   There were no vitals filed for this visit. Weight change:   Intake/Output from previous day: 09/12 0701 - 09/13 0700 In: -  Out: 1000 [Urine:1000] Intake/Output this shift: No intake/output data recorded. There were no vitals filed for this visit.  Examination: General exam: AAOx to self, hospital, follows simple commands HEENT:Oral mucosa moist, Ear/Nose WNL grossly, dentition normal. Respiratory system: bilaterally clear breath sounds, no use of accessory muscle Cardiovascular system: S1 &  S2 +, No JVD,. Gastrointestinal system: Abdomen soft, PEG+ NT,ND, BS+ Nervous System:Alert, awake, moving extremities and grossly nonfocal Extremities: No edema, distal peripheral pulses palpable.  Skin: Raw dry macerated skin on the trunk and the neck with mild scaling chronic skin changes skin. MSK: Normal muscle bulk,tone, power   Data Reviewed: I have personally  reviewed following labs and imaging studies CBC: Recent Labs  Lab 07/20/21 1754 07/20/21 1820 07/21/21 0500 07/22/21 0324 07/23/21 0352  WBC 12.5*  --  12.6* 12.3* 8.7  NEUTROABS 8.8*  --   --   --   --   HGB 8.0* 8.2* 7.9* 8.7* 8.0*  HCT 26.0* 24.0* 25.2* 28.4* 25.1*  MCV 100.4*  --  99.6 100.4* 98.8  PLT 212  --  204 217 223   Basic Metabolic Panel: Recent Labs  Lab 07/20/21 1754 07/20/21 1820 07/21/21 0500 07/22/21 0324 07/23/21 0352  NA 137 140 135 136 136  K 4.1 4.2 3.8 3.9 3.7  CL 105 105 104 106 105  CO2 23  --  21* 20* 20*  GLUCOSE 79 80 86 71 101*  BUN 26* 24* 21 21 27*  CREATININE 0.77 0.60 0.78 0.80 1.01*  CALCIUM 8.7*  --  8.6* 8.7* 8.1*   GFR: Estimated Creatinine Clearance: 52.6 mL/min (A) (by C-G formula based on SCr of 1.01 mg/dL (H)). Liver Function Tests: Recent Labs  Lab 07/20/21 1754 07/22/21 0324 07/23/21 0352  AST 17 22 16   ALT 5 7 5   ALKPHOS 141* 112 103  BILITOT 0.8 1.9* 1.7*  PROT 7.4 7.0 6.3*  ALBUMIN 1.6* 1.6* 1.4*   No results for input(s): LIPASE, AMYLASE in the last 168 hours. No results for input(s): AMMONIA in the last 168 hours. Coagulation Profile: No results for input(s): INR, PROTIME in the last 168 hours. Cardiac Enzymes: No results for input(s): CKTOTAL, CKMB, CKMBINDEX, TROPONINI in the last 168 hours. BNP (last 3 results) No results for input(s): PROBNP in the last 8760 hours. HbA1C: Recent Labs    07/21/21 0500  HGBA1C 5.1   CBG: Recent Labs  Lab 07/22/21 1951 07/22/21 2325 07/23/21 0332 07/23/21 0746 07/23/21 1133  GLUCAP 115* 89 88 107* 94   Lipid Profile: Recent Labs    07/21/21 0500  CHOL 137  HDL 29*  LDLCALC 88  TRIG 07/25/21  CHOLHDL 4.7   Thyroid Function Tests: No results for input(s): TSH, T4TOTAL, FREET4, T3FREE, THYROIDAB in the last 72 hours. Anemia Panel: No results for input(s): VITAMINB12, FOLATE, FERRITIN, TIBC, IRON, RETICCTPCT in the last 72 hours. Sepsis Labs: Recent Labs   Lab 07/20/21 1754  LATICACIDVEN 1.3    Recent Results (from the past 240 hour(s))  Resp Panel by RT-PCR (Flu A&B, Covid) Nasopharyngeal Swab     Status: None   Collection Time: 07/20/21  5:20 PM   Specimen: Nasopharyngeal Swab; Nasopharyngeal(NP) swabs in vial transport medium  Result Value Ref Range Status   SARS Coronavirus 2 by RT PCR NEGATIVE NEGATIVE Final    Comment: (NOTE) SARS-CoV-2 target nucleic acids are NOT DETECTED.  The SARS-CoV-2 RNA is generally detectable in upper respiratory specimens during the acute phase of infection. The lowest concentration of SARS-CoV-2 viral copies this assay can detect is 138 copies/mL. A negative result does not preclude SARS-Cov-2 infection and should not be used as the sole basis for treatment or other patient management decisions. A negative result may occur with  improper specimen collection/handling, submission of specimen other than nasopharyngeal swab, presence of viral mutation(s)  within the areas targeted by this assay, and inadequate number of viral copies(<138 copies/mL). A negative result must be combined with clinical observations, patient history, and epidemiological information. The expected result is Negative.  Fact Sheet for Patients:  BloggerCourse.com  Fact Sheet for Healthcare Providers:  SeriousBroker.it  This test is no t yet approved or cleared by the [place] FDA and  has been authorized for detection and/or diagnosis of SARS-CoV-2 by FDA under an Emergency Use Authorization (EUA). This EUA will remain  in effect (meaning this test can be used) for the duration of the COVID-19 declaration under Section 564(b)(1) of the Act, 21 U.S.C.section 360bbb-3(b)(1), unless the authorization is terminated  or revoked sooner.       Influenza A by PCR NEGATIVE NEGATIVE Final   Influenza B by PCR NEGATIVE NEGATIVE Final    Comment: (NOTE) The Xpert Xpress  SARS-CoV-2/FLU/RSV plus assay is intended as an aid in the diagnosis of influenza from Nasopharyngeal swab specimens and should not be used as a sole basis for treatment. Nasal washings and aspirates are unacceptable for Xpert Xpress SARS-CoV-2/FLU/RSV testing.  Fact Sheet for Patients: BloggerCourse.com  Fact Sheet for Healthcare Providers: SeriousBroker.it  This test is not yet approved or cleared by the [place] FDA and has been authorized for detection and/or diagnosis of SARS-CoV-2 by FDA under an Emergency Use Authorization (EUA). This EUA will remain in effect (meaning this test can be used) for the duration of the COVID-19 declaration under Section 564(b)(1) of the Act, 21 U.S.C. section 360bbb-3(b)(1), unless the authorization is terminated or revoked.  Performed at [facility], [address], [place], [place] [address]   Culture, blood (routine x 2)     Status: None (Preliminary result)   Collection Time: 07/20/21  6:06 PM   Specimen: BLOOD  Result Value Ref Range Status   Specimen Description BLOOD SITE NOT SPECIFIED  Final   Special Requests   Final    BOTTLES DRAWN AEROBIC AND ANAEROBIC Blood Culture adequate volume   Culture   Final    NO GROWTH 3 DAYS Performed at [facility], [address] [place], [place] [address]    Report Status PENDING  Incomplete  Culture, blood (routine x 2)     Status: None (Preliminary result)   Collection Time: 07/20/21  6:09 PM   Specimen: BLOOD  Result Value Ref Range Status   Specimen Description BLOOD SITE NOT SPECIFIED  Final   Special Requests   Final    BOTTLES DRAWN AEROBIC ONLY Blood Culture adequate volume   Culture   Final    NO GROWTH 3 DAYS Performed at [facility], [address], [place], [place] [address]    Report Status PENDING  Incomplete  MRSA Next Gen by PCR, Nasal     Status: None   Collection Time: 07/20/21  9:44 PM    Specimen: Nasal Mucosa; Nasal Swab  Result Value Ref Range Status   MRSA by PCR Next Gen NOT DETECTED NOT DETECTED Final    Comment: (NOTE) The GeneXpert MRSA Assay (FDA approved for NASAL specimens only), is one component of a comprehensive MRSA colonization surveillance program. It is not intended to diagnose MRSA infection nor to guide or monitor treatment for MRSA infections. Test performance is not FDA approved in patients less than 88 years old. Performed at [facility], [address], [place], [place] [address]   Urine Culture     Status: Abnormal (  Preliminary result)   Collection Time: 07/22/21  5:37 PM   Specimen: Urine, Catheterized  Result Value Ref Range Status   Specimen Description URINE, CATHETERIZED  Final   Special Requests NONE  Final   Culture (A)  Final    30,000 COLONIES/mL GRAM NEGATIVE RODS 40,000 COLONIES/mL YEAST SUSCEPTIBILITIES TO FOLLOW Performed at [facility], [address], [place], [place] [address]    Report Status PENDING  Incomplete     Radiology Studies: MR BRAIN W CONTRAST  Result Date: 07/23/2021 CLINICAL DATA:  Meningitis/CNS infection suspected. EXAM: MRI HEAD WITH CONTRAST TECHNIQUE: Multiplanar, multiecho pulse sequences of the brain and surrounding structures were obtained with intravenous contrast. CONTRAST:  7.85mL GADAVIST GADOBUTROL 1 MMOL/ML IV SOLN COMPARISON:  MRI of the brain July 22, 2019. FINDINGS: Postcontrast images show no evidence of parenchymal or leptomeningeal focus of abnormal contrast. IMPRESSION: No intracranial focus of abnormal contrast enhancement. Electronically Signed   By: Baldemar Lenis M.D.   On: 07/23/2021 14:15     LOS: 3 days   Lanae Boast, MD [facility]  07/23/2021, 2:56 PM

## 2021-07-23 NOTE — Progress Notes (Addendum)
Daily Progress Note   Patient Name: BUENA BOEHM       Date: 07/23/2021 DOB: Apr 29, 1946  Age: 75 y.o. MRN#: [id number] Attending Physician: Lanae Boast, MD Primary Care Physician: Darrow Bussing, MD Admit Date: 07/20/2021  Reason for Consultation/Follow-up: Establishing goals of care  Subjective: Medical records reviewed. Discussed with RN and assessed patient at the bedside. She is oriented to self and place, states it is 1932. Denies pain or distress and shares that she hopes to go home tomorrow. Patient's daughter [name] and patient's grandson then arrive at the bedside.  Created space and opportunity for Kimberly's thoughts and feelings on patient's current illness. She remains very concerned with the quality of overall care her mother has received. I provided an update on [facility] transfer request (declined at this time). Emotional support was provided and empathetic listening was practiced. We discussed the current plan to obtain MRI brain w/ contrast to evaluate possible pontine infarct and determine further steps. [name] feels this should have already been completed today and understandably voices her frustration. She remains hopeful and committed to advocating for her mother's recovery. She also shares her concern that a blood thinner was not started when she was recently discharged. Educated on the risks and benefits and stratification of patients when considering blood thinners for stroke prevention. We discussed patient's progression to dysphagia 1 diet and the importance of ongoing rehab efforts.  Questions and concerns addressed. PMT will continue to support holistically.   Length of Stay: 3  Current Medications: Scheduled Meds:    stroke: mapping our early stages of recovery book    Does not apply Once   acetaminophen  650 mg Per Tube Q6H   allopurinol  100 mg Per Tube Daily   aspirin EC  81 mg Oral Daily   atorvastatin  40 mg Oral QHS   Chlorhexidine Gluconate Cloth  6 each Topical Daily   diclofenac Sodium  4 g Topical TID AC & HS   feeding supplement (PROSource TF)  45 mL Per Tube TID   free water  125 mL Per Tube Q4H   lubriderm seriously sensitive   Topical BID   midodrine  5 mg Per Tube TID WC   OLANZapine  2.5 mg Oral QHS   [START ON 07/25/2021] thiamine injection  100 mg Intravenous Q8H  triamcinolone 0.1 % cream : eucerin   Topical BID    Continuous Infusions:  feeding supplement (OSMOLITE 1.5 CAL) 1,000 mL (07/23/21 0617)   lactated ringers     nafcillin IV 2 g (07/23/21 0553)   thiamine injection 400 mg (07/23/21 0445)    PRN Meds: acetaminophen **OR** acetaminophen, diphenhydrAMINE, hydrOXYzine, ketorolac, ondansetron **OR** ondansetron (ZOFRAN) IV, oxyCODONE  Physical Exam Vitals and nursing note reviewed.  Constitutional:      General: She is not in acute distress.    Appearance: She is ill-appearing.  Cardiovascular:     Rate and Rhythm: Tachycardia present.  Pulmonary:     Effort: Pulmonary effort is normal.  Neurological:     Mental Status: She is alert. She is disoriented.  Psychiatric:        Behavior: Behavior is cooperative.            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   Cerebral embolism with cerebral infarction 07/21/2021   Subarachnoid hemorrhage 07/21/2021   Intracerebral hemorrhage 07/21/2021   Generalized weakness 07/20/2021   Blood loss anemia 07/12/2021   Abnormal urinalysis 07/11/2021   Acute urinary retention 07/11/2021   MSSA (methicillin susceptible Staphylococcus aureus) infection/UTI-POA 07/11/2021   Cervical discitis 07/11/2021   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) 07/11/2021   Dysphagia, neurologic  07/11/2021   CKD stage G3b/A1, 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 female  with past medical history of polycythemia, CKD 3b, ETOH use, with a recent admission from 8/1 - 9/1.   During that hospitalization she was found to have an epidural abscess involving the cervical spine along with this discitis.  She underwent C5-C6 ACDF on 8/7-cultures were positive for MSSA.  She was subsequently seen by infectious disease-with recommendations to continue IV cefazolin and Ancef until 10/2.  Hospital course was complicated by encephalopathy, dysphagia (PEG tube in place) and recurrent urinary retention (Foley catheter in place).  She was discharged to [facility].   She was admitted to [facility] on 9/10 with SOB and confusion. 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  Spinal imaging showed worsening osteomyelitis discitis due to MSSA at C5-6 C6-7 C7-T1  after ACDF surgery along with epidural abscess from C4-C6 7 with moderate spinal stenosis,  and osteomyelitis discitis at T12-L1 with abscess along the right margin of T12-L1 and acute septic arthritis of the SI joint with a soft tissue abscess.   Assessment: Goals of care conversation Osteomyelitis discitis, cervical Epidural abscess, cervical Acute metabolic encephalopathy Possible central pontine infarct, acute  Recommendations/Plan: Goals are clear for full code/full scope treatment Family remains interested in transfer, unfortunately [facility] has declined Psychosocial and emotional support provided PMT will continue to support, assist with coordination of multidisciplinary meeting if family is agreeable  Goals of Care and Additional Recommendations: Limitations on Scope of Treatment: Full Scope Treatment  CPrognosis:  Unable to determine  Discharge Planning: To Be Determined   Care plan was discussed with Patient's daughter, Dr. Jonathon Bellows, Dr. Pearlean Brownie  Total time: 35 minutes Greater than 50% of this time was spent in counseling and coordinating care related to the above assessment and plan.  Richardson Dopp, PA-C Palliative Medicine Team Team phone # [phone]  Thank you for allowing the Palliative Medicine Team to assist in the care of this patient. Please utilize secure chat with additional questions, if there is no response within 30 minutes please call the above phone number.  Palliative Medicine Team providers are available by phone from 7am to 7pm daily and can be reached through the team cell phone.  Should this patient require assistance outside of these hours, please call the patient's attending physician.

## 2021-07-23 NOTE — Progress Notes (Signed)
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 Her strength on initial neurologic exam this admission was 3/5 in all extremities without reported spasticity/tremor/rigidity.   I reviewed repeat mri. It appears changes could be post-op/evolution of infection but no drastic worsening of treatment  There is a question if she has been receiving abx via midline 9/2-9/10 at nursing home; midline removed just prior to 9/10 admission    I spoke with neurology, and new review unclear if pontine mri changes is stroke. Team is considering extension of infection vs osmotic demyelination.   From ID standpoint, I do not see obvious worsening in infectious process.   She has dermatitis that appears to be worse. Not clear what her baseline is but rash not new per daughter. She itches more as of 9/12. So will stop rifampin and continue nafcillin given concern of meningitis per repeat neurology evaluation  ------------------- 9/13 assessment Stable chronic rash appearance. Wbc down  a bit after stopping rifampin Reviewed neurology recommendation  From id standpoint, agree with mri wwo contrast brain plus minus LP as it could potentially dictate antibiotics choice  On neurological exam strength fairly symmetric and appears to be 4/5 in all extremities which is better from previous documentation  PLAN: Continue nafcillin for now The original plan in terms of abx was to go till 10/02. This will depend on what we find on repeat imaging. I suspect will need longer abx with indefinite suppressive abx for the hardware Q72hrs cbc, cmp, crp  I spent more than 35 minute reviewing data/chart, and coordinating care and >50% direct face to face time providing counseling/discussing diagnostics/treatment plan with patient   Active Problems:   Generalized weakness   Cerebral thrombosis with cerebral infarction   Cerebral embolism with cerebral infarction   Subarachnoid hemorrhage   Intracerebral hemorrhage   Vertebral osteomyelitis (HCC)   Epidural abscess   No Known Allergies  Scheduled Meds:   stroke: mapping our early stages of recovery book   Does not apply Once   acetaminophen  650 mg Per Tube Q6H   allopurinol  100 mg Per Tube Daily   aspirin EC  81 mg Oral Daily   atorvastatin  40 mg Oral QHS   Chlorhexidine Gluconate Cloth  6 each Topical Daily   diclofenac Sodium  4 g Topical TID AC & HS   feeding supplement (PROSource TF)  45 mL Per Tube TID   free water  125 mL Per Tube Q4H   lubriderm seriously sensitive   Topical BID   midodrine  5 mg Per Tube TID WC   OLANZapine  2.5 mg  Oral QHS   [START ON 07/25/2021] thiamine injection  100 mg Intravenous Q8H   triamcinolone 0.1 % cream : eucerin   Topical BID   Continuous Infusions:  feeding supplement (OSMOLITE 1.5 CAL) 1,000 mL (07/23/21 0617)   lactated ringers     nafcillin IV 2 g (07/23/21 0553)   thiamine injection 400 mg (07/23/21 0445)   PRN Meds:.acetaminophen **OR** acetaminophen, diphenhydrAMINE,  hydrOXYzine, ketorolac, ondansetron **OR** ondansetron (ZOFRAN) IV, oxyCODONE   SUBJECTIVE: Rash itch same Wbc improved No n/v/diarrhea/headache Minimal neck and back pain if any  Review of Systems: ROS All other ROS was negative, except mentioned above     OBJECTIVE: Vitals:   07/22/21 2315 07/23/21 0320 07/23/21 0743 07/23/21 1130  BP: (!) 100/57 (!) 101/51 (!) 115/91 124/62  Pulse: 92 97 (!) 105 (!) 101  Resp: 16 16 17 19   Temp: 98.3 F (36.8 C) 98.2 F (36.8 C) 99.7 F (37.6 C) 98.8 F (37.1 C)  TempSrc: Oral Oral Axillary Axillary  SpO2: 97% 98% 99% 100%   There is no height or weight on file to calculate BMI.  Physical Exam General/constitutional: no distress, pleasant HEENT: Normocephalic, PER, Conj Clear, EOMI, Oropharynx clear Neck supple CV: rrr no mrg Lungs: clear to auscultation, normal respiratory effort Abd: Soft, Nontender Ext: no edema Skin: stable maceration and raw skin multiple places on trunk/ext; no vaginal/oral mucosal involvement; some fresh bleeding at a few places of raw skin. No palpable purpura or petechiae. Hyperpigmentation bilateral distal LE Neuro: generalized weakness; no spasticity/tremors; strength 4/5 in all extremities MSK: no focal joint swelling/tenderness; cries out as we turn her of generalized discomfort/pain   Lab Results Lab Results  Component Value Date   WBC 8.7 07/23/2021   HGB 8.0 (L) 07/23/2021   HCT 25.1 (L) 07/23/2021   MCV 98.8 07/23/2021   PLT 223 07/23/2021    Lab Results  Component Value Date   CREATININE 1.01 (H) 07/23/2021   BUN 27 (H) 07/23/2021   NA 136 07/23/2021   K 3.7 07/23/2021   CL 105 07/23/2021   CO2 20 (L) 07/23/2021    Lab Results  Component Value Date   ALT 5 07/23/2021   AST 16 07/23/2021   ALKPHOS 103 07/23/2021   BILITOT 1.7 (H) 07/23/2021      Microbiology: Recent Results (from the past 240 hour(s))  Resp Panel by RT-PCR (Flu A&B, Covid) Nasopharyngeal Swab     Status:  None   Collection Time: 07/20/21  5:20 PM   Specimen: Nasopharyngeal Swab; Nasopharyngeal(NP) swabs in vial transport medium  Result Value Ref Range Status   SARS Coronavirus 2 by RT PCR NEGATIVE NEGATIVE Final    Comment: (NOTE) SARS-CoV-2 target nucleic acids are NOT DETECTED.  The SARS-CoV-2 RNA is generally detectable in upper respiratory specimens during the acute phase of infection. The lowest concentration of SARS-CoV-2 viral copies this assay can detect is 138 copies/mL. A negative result does not preclude SARS-Cov-2 infection and should not be used as the sole basis for treatment or other patient management decisions. A negative result may occur with  improper specimen collection/handling, submission of specimen other than nasopharyngeal swab, presence of viral mutation(s) within the areas targeted by this assay, and inadequate number of viral copies(<138 copies/mL). A negative result must be combined with clinical observations, patient history, and epidemiological information. The expected result is Negative.  Fact Sheet for Patients:  09/19/21  Fact Sheet for Healthcare Providers:  BloggerCourse.com  This test is no t yet approved  or cleared by the [place] and  has been authorized for detection and/or diagnosis of SARS-CoV-2 by FDA under an Emergency Use Authorization (EUA). This EUA will remain  in effect (meaning this test can be used) for the duration of the COVID-19 declaration under Section 564(b)(1) of the Act, 21 U.S.C.section 360bbb-3(b)(1), unless the authorization is terminated  or revoked sooner.       Influenza A by PCR NEGATIVE NEGATIVE Final   Influenza B by PCR NEGATIVE NEGATIVE Final    Comment: (NOTE) The Xpert Xpress SARS-CoV-2/FLU/RSV plus assay is intended as an aid in the diagnosis of influenza from Nasopharyngeal swab specimens and should not be used as a sole basis for  treatment. Nasal washings and aspirates are unacceptable for Xpert Xpress SARS-CoV-2/FLU/RSV testing.  Fact Sheet for Patients: BloggerCourse.com  Fact Sheet for Healthcare Providers: SeriousBroker.it  This test is not yet approved or cleared by the [place] FDA and has been authorized for detection and/or diagnosis of SARS-CoV-2 by FDA under an Emergency Use Authorization (EUA). This EUA will remain in effect (meaning this test can be used) for the duration of the COVID-19 declaration under Section 564(b)(1) of the Act, 21 U.S.C. section 360bbb-3(b)(1), unless the authorization is terminated or revoked.  Performed at [facility], [address] [place], [place] [address]   Culture, blood (routine x 2)     Status: None (Preliminary result)   Collection Time: 07/20/21  6:06 PM   Specimen: BLOOD  Result Value Ref Range Status   Specimen Description BLOOD SITE NOT SPECIFIED  Final   Special Requests   Final    BOTTLES DRAWN AEROBIC AND ANAEROBIC Blood Culture adequate volume   Culture   Final    NO GROWTH 3 DAYS Performed at [facility], [address], [place], [place] [address]    Report Status PENDING  Incomplete  Culture, blood (routine x 2)     Status: None (Preliminary result)   Collection Time: 07/20/21  6:09 PM   Specimen: BLOOD  Result Value Ref Range Status   Specimen Description BLOOD SITE NOT SPECIFIED  Final   Special Requests   Final    BOTTLES DRAWN AEROBIC ONLY Blood Culture adequate volume   Culture   Final    NO GROWTH 3 DAYS Performed at [facility], [address] [place], [place] [address]    Report Status PENDING  Incomplete  MRSA Next Gen by PCR, Nasal     Status: None   Collection Time: 07/20/21  9:44 PM   Specimen: Nasal Mucosa; Nasal Swab  Result Value Ref Range Status   MRSA by PCR Next Gen NOT DETECTED NOT DETECTED Final    Comment: (NOTE) The GeneXpert MRSA Assay  (FDA approved for NASAL specimens only), is one component of a comprehensive MRSA colonization surveillance program. It is not intended to diagnose MRSA infection nor to guide or monitor treatment for MRSA infections. Test performance is not FDA approved in patients less than 23 years old. Performed at [facility], [address], [place], [place] [address]      Serology:   Imaging: If present, new imagings (plain films, ct scans, and mri) have been personally visualized and interpreted; radiology reports have been reviewed. Decision making incorporated into the Impression / Recommendations.  9/11 cspine mri wwo contrast 1. Technically limited exam due to motion artifact. 2. Sequelae of interval ACDF at C5-6. Evidence for persistent and/or recurrent osteomyelitis discitis  at C5-6, C6-7, C7-T1, and likely T1-2. 3. Associated ventral epidural phlegmon/abscess extending from C4 through C6-7 with up to moderate spinal stenosis at these levels. No visible cord signal changes on this motion degraded exam. 4. Associated prevertebral and posterior paraspinous edema without visible discrete soft tissue abscess on this motion degraded exam. 5. Evolving acute to subacute pontine infarct, better evaluated on prior brain MRI  9/11 lumbar mri wwo contrast 1. 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 Probable tiny 7 mm abscess along the right margin of the T12-L1 disc space. No other significant epidural phlegmon or abscess. 2. Acute septic arthritis of the right SI joint. Associated edema and enhancement within the adjacent right posterior paraspinous and iliacus musculature. Superimposed 2.2 x 1.4 cm soft tissue abscess as above. 3. Underlying multilevel lumbar spondylosis and facet arthrosis with resultant mild-to-moderate lateral recess narrowing at L2-3 through L4-5 as above.  9/11 mri brain without contrast 1. 1.5 cm acute to subacute ischemic nonhemorrhagic  central pontine infarct. 2. Underlying mild to moderate chronic microvascular ischemic disease.  Raymondo Band, MD [facility] [phone] pager    07/23/2021, 11:52 AM

## 2021-07-23 NOTE — Progress Notes (Signed)
STROKE TEAM PROGRESS NOTE   INTERVAL HISTORY Patient is doing better.  She is awake alert and interactive when sitting up in bed.  She is able to clearly articulate.  She is slightly disoriented but cooperative on exam.  She has mild quadriparesis mostly proximal weakness of the shoulders and thigh muscles.  She appears to have good strength distally.  MRI scan of the brain with contrast shows no abnormal enhancement in the brain or meninges. I spoke over the phone with the patient's daughter and answered her questions about stroke and prognosis of improvement and need for rehab Vitals:   07/22/21 2315 07/23/21 0320 07/23/21 0743 07/23/21 1130  BP: (!) 100/57 (!) 101/51 (!) 115/91 124/62  Pulse: 92 97 (!) 105 (!) 101  Resp: 16 16 17 19   Temp: 98.3 F (36.8 C) 98.2 F (36.8 C) 99.7 F (37.6 C) 98.8 F (37.1 C)  TempSrc: Oral Oral Axillary Axillary  SpO2: 97% 98% 99% 100%   CBC:  Recent Labs  Lab 07/20/21 1754 07/20/21 1820 07/22/21 0324 07/23/21 0352  WBC 12.5*   < > 12.3* 8.7  NEUTROABS 8.8*  --   --   --   HGB 8.0*   < > 8.7* 8.0*  HCT 26.0*   < > 28.4* 25.1*  MCV 100.4*   < > 100.4* 98.8  PLT 212   < > 217 223   < > = values in this interval not displayed.   Basic Metabolic Panel:  Recent Labs  Lab 07/22/21 0324 07/23/21 0352  NA 136 136  K 3.9 3.7  CL 106 105  CO2 20* 20*  GLUCOSE 71 101*  BUN 21 27*  CREATININE 0.80 1.01*  CALCIUM 8.7* 8.1*    Lipid Panel:  Recent Labs  Lab 07/21/21 0500  CHOL 137  TRIG 100  HDL 29*  CHOLHDL 4.7  VLDL 20  LDLCALC 88    HgbA1c:  Recent Labs  Lab 07/21/21 0500  HGBA1C 5.1   Urine Drug Screen: No results for input(s): LABOPIA, COCAINSCRNUR, LABBENZ, AMPHETMU, THCU, LABBARB in the last 168 hours.  Alcohol Level No results for input(s): ETH in the last 168 hours.  IMAGING past 24 hours MR BRAIN W CONTRAST  Result Date: 07/23/2021 CLINICAL DATA:  Meningitis/CNS infection suspected. EXAM: MRI HEAD WITH CONTRAST  TECHNIQUE: Multiplanar, multiecho pulse sequences of the brain and surrounding structures were obtained with intravenous contrast. CONTRAST:  7.72mL GADAVIST GADOBUTROL 1 MMOL/ML IV SOLN COMPARISON:  MRI of the brain July 22, 2019. FINDINGS: Postcontrast images show no evidence of parenchymal or leptomeningeal focus of abnormal contrast. IMPRESSION: No intracranial focus of abnormal contrast enhancement. Electronically Signed   By: July 24, 2019 M.D.   On: 07/23/2021 14:15    PHYSICAL EXAM  Neurological exam Awake alert oriented to self.  She knows she is in the hospital but cannot tell me the correct name Able to name objects.  Speech is mildly dysarthric.  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  Face appears grossly symmetric.  Palate is symmetric but the elevation is incomplete.  Tongue is midline. Motor exam: She is able to move all 4 extremities but is barely 3/5 in all 4 extremities but symmetric with proximal greater than distal  weakness.  she barely could be antigravity so I would rate her strength  in all extremities proximally to be 2/5. DTRs are brisk symmetric. Sensation: Intact to touch Coordination-cannot assess due to the above  ASSESSMENT/PLAN Ms. Kristy [name] is a 75 y.o. female with history of   history of hypertension, polycythemia, alcohol use disorder, recent cervical epidural abscess C2-T3 T4 status post ACDF on 06/16/2021 by neurosurgery with cultures positive for MSSA, hospital course complicated by dysphagia and encephalopathy, recurrent UTI, PEG tube placement, brought from a facility where the family when visiting the patient and found her to be more short of breath and more confused than usual.  History is obtained from the chart as patient is unable to provide  reliable history.  Presumably, the daughter went to see the patient and she was not acting like her self.  She was also weak all over and all 4 extremities, which prompted call to EMS who brought her to the ER for further evaluation.  Unclear last known well. Patient was not able to provide much meaningful history by herself.  No family was present at the time of this encounter.   She was admitted in August after sustaining multiple falls.  She was noted to be very encephalopathic.  She was evaluated by neurology and plan was for a lumbar puncture given nuchal rigidity and confusion.  Further imaging revealed that she had a spinal epidural abscess which required emergent operative treatment by neurosurgery.  She had a prolonged hospital course complicated by dysphagia, encephalopathy, recurrent UTIs.  Eventually placed on PEG tube and transferred to a SNF.  At baseline encephalopathic to the point where she is only oriented to self according to chart review.    Mid pontine lesion of questionable etiology subacute infarct versus infected emboli in the setting of epidural abscess.   CT head suspicious for acute to subacute pontine infarct, new compared from august. Atrophy and small vessel changes.    CTA head & neck  negative for large vessel occlusion.  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 2D Echo LVEF 60-65%, mild LVH, normal size LA, no IA shunt LDL 88 HgbA1c 5.1 VTE prophylaxis - scd     Diet   DIET - DYS 1 Room service appropriate? Yes; Fluid consistency: Thin   No antithrombotic prior to admission, now on aspirin 81 mg daily.   Therapy recommendations:  SNF Disposition:  pending     Hyperlipidemia Home meds:  none  documented,  LDL 88, goal < 70 Add lipitor 40mg   High intensity statin   Continue statin at discharge    SSI  Other Stroke Risk Factors  Advanced Age >/= 35   Hx stroke/TIA   Other Active Problems  Metabolic  encephalopathy-likely at baseline, patient is oriented to self only.  We will follow results of MRI brain. Community-acquired pneumonia-concern for pneumonia on CT chest, patient has been started on vancomycin and cefepime.  Follow blood culture results. Probable CAUTI-patient has abnormal UA, she has a Foley catheter in place for recurrent urinary retention.  Urine culture has been obtained.  We will follow the results of culture. Nutrition-patient has PEG tube in place, will continue Osmolite at 65 mill per hour.  125 mL water every 4 hours via tube. Cervical epidural abscess-s/p C5-C6 ACDF on 8/7, cultures were positive for MSSA, patient was started on cefazolin and rifampin till 08/11/2021.  We will hold cefazolin and rifampin at this time as patient has been started on vancomycin and cefepime as above. T12/L1 dystrophic changes-CT abdomen/pelvis  showed T12/L1 dystrophic changes, MRI lumbar spine ordered.  We will follow the results.     Patient has had a prolonged hospital stay for for recurrent falls related to spinal epidural abscess requiring surgery and hospital course has been complicated by encephalopathy and recurrent UTIs.  MRI scan of the brain shows mid pontine lesion which appears to be subacute  infarct likely affecting multiple penetrating arteries due to small vessel disease..  Continue aspirin 81 mg daily for stroke prevention and statin for elevated lipids and aggressive risk factor modification.  Patient's clinical exam does not seem compatible with brainstem infarct in that location and her encephalopathy seems to be improving.  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 Continue antibiotics as per ID team.   Expect slow gradual recovery with antibiotics and ongoing therapies.  Discussed with patient's daughter and answered questions.  Discussed with Dr. Lanae Boast stroke team will sign off..  Greater than 50% time during this 35-minute visit  were spent on counseling and coordination of care and discussion with care team and answering questions.  Delia Heady, MD Medical Director [facility] Pager: [phone] 07/23/2021 2:46 PM      To contact Stroke Continuity provider, please refer to [email or web address]. After hours, contact General Neurology

## 2021-07-23 NOTE — Progress Notes (Signed)
Assessed by 2 nurses. Placed Midline, please consider CVC/PICC due to poor vasculature. Tomasita Morrow, RN VAST

## 2021-07-23 NOTE — Care Management Important Message (Signed)
Important Message  Patient Details  Name: Kristy [name] MRN: [id number] Date of Birth: 1946-09-01   Medicare Important Message Given:  Yes     Dorena Bodo 07/23/2021, 1:18 PM

## 2021-07-23 NOTE — Progress Notes (Signed)
  Speech Language Pathology Treatment: Dysphagia  Patient Details Name: Kristy [name] MRN: [id number] DOB: 08-29-1946 Today's Date: 07/23/2021 Time: 1450-1500 SLP Time Calculation (min) (ACUTE ONLY): 10 min  Assessment / Plan / Recommendation Clinical Impression  Pt seen at bedside for assessment of diet tolerance. RN reports pt has been receiving trays, but has declined to eat, preferring PEG feeds. Pt seated at 45 degrees due to significant pain with being positioned more upright. Pt accepted trials of thin liquid via straw, and puree textures. Pt demonstrated timely oral clearing. No overt s/s aspiration noted. Will continue to offer puree solids and thin liquids and encourage PO intake.    HPI HPI: Kristy [name]  is a 75 y.o. female, with medical history of essential hypertension, polycythemia, alcohol use disorder who was recently discharged from [facility] on 07/11/2021 after she was found to have cervical epidural abscess C2 to T3/4 s/p ACDF on 06/16/2021  by neurosurgery, cultures are positive for MSSA, patient started on IV cefazolin and rifampin until 08/11/2021, hospital course was complicated by encephalopathy, dysphagia, PEG tube placed, recurrent urinary retention, Foley catheter placed.  As per daughter when she visited patient today she was more short of breath and found her to be more confused than usual.  Patient was brought to the ED for further evaluation.  Initially daughter wanted her to be transferred to [facility] however ED provider called [facility] and was told that she may have to wait for many days , since there was no urgent need for transfer except family request.  In the ED imaging, CT head showed acute/subacute possible pontine infarct.  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  Patient is well known to ST services from previous admission.   Decreased alertness and mentation appeared to be her biggest risk factors for aspiration.  During that admission she recieved a PEG.      SLP Plan  Continue with current plan of care       Recommendations  Diet recommendations: Dysphagia 1 (puree);Thin liquid Liquids provided via: Straw;Cup Medication Administration: Via alternative means Supervision: Full supervision/cueing for compensatory strategies Compensations: Slow rate;Small sips/bites;Minimize environmental distractions Postural Changes and/or Swallow Maneuvers: Seated upright 90 degrees;Upright 30-60 min after meal                Oral Care Recommendations: Staff/trained caregiver to provide oral care;Oral care BID Follow up Recommendations: Skilled Nursing facility SLP Visit Diagnosis: Dysphagia, oral phase (R13.11) Plan: Continue with current plan of care    Orville Widmann B. Murvin Natal, [facility], CCC-SLP Speech Language Pathologist Office: [phone]     GO                [name] 07/23/2021, 3:07 PM

## 2021-07-23 NOTE — Evaluation (Signed)
Speech Language Pathology Evaluation Patient Details Name: Kristy [name] MRN: [id number] DOB: April 13, 1946 Today's Date: 07/23/2021 Time: 1450-1505 SLP Time Calculation (min) (ACUTE ONLY): 15 min  Problem List:  Patient Active Problem List   Diagnosis Date Noted   Vertebral osteomyelitis (HCC)    Epidural abscess    Generalized weakness 07/20/2021   Blood loss anemia 07/12/2021   Abnormal urinalysis 07/11/2021   Acute urinary retention 07/11/2021   MSSA (methicillin susceptible Staphylococcus aureus) infection/UTI-POA 07/11/2021   Cervical discitis 07/11/2021   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) 07/11/2021   Dysphagia, neurologic  07/11/2021   CKD stage G3b/A1, 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 No pertinent past medical history. Past Surgical History:  Past Surgical History:  Procedure Laterality Date   ANTERIOR CERVICAL DECOMP/DISCECTOMY FUSION N/A 06/16/2021   Procedure: ANTERIOR CERVICAL DISCECTOMY FUSION Cervical five -Cervical six, Evacuation of Epidural Abscess;  Surgeon: Jadene Pierini, MD;  Location: [facility] OR;  Service: Neurosurgery;  Laterality: N/A;   IR GASTROSTOMY TUBE MOD SED  07/02/2021   HPI:  Kristy [name]  is a 75 y.o. female, with medical history of essential hypertension, polycythemia, alcohol use disorder who was recently discharged from [facility] on 07/11/2021 after she was found to have cervical epidural abscess C2 to T3/4 s/p ACDF on 06/16/2021  by neurosurgery, cultures are positive for MSSA, patient  started on IV cefazolin and rifampin until 08/11/2021, hospital course was complicated by encephalopathy, dysphagia, PEG tube placed, recurrent urinary retention, Foley catheter placed.  As per daughter when she visited patient today she was more short of breath and found her to be more confused than usual.  Patient was brought to the ED for further evaluation.  Initially daughter wanted her to be transferred to [facility] however ED provider called [facility] and was told that she may have to wait for many days , since there was no urgent need for transfer except family request.  In the ED imaging, CT head showed acute/subacute possible pontine infarct.  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  Patient is well known to ST services from previous admission.  Decreased alertness and mentation appeared to be her biggest risk factors for aspiration.  During that admission she recieved a PEG.   Assessment / Plan / Recommendation Clinical Impression  The Mini-Mental State Examination (MMSE) was administered. Pt scored 15/27 (unable to complete reading, writing, and figure drawing items due to low vision). Deficits identified in orientation (time), attention, and delayed recall. Anticipate deficits in higher level cognitive skills, including problem solving, reasoning, awareness, and executive functions. Speech and language skills appear to be intact at this time. Pt would benefit from continued ST intervention targeting cognition to maximize safety. Recommend continued speech therapy at next venue of care.    SLP Assessment  SLP Recommendation/Assessment: All further Speech Language Pathology  needs can be addressed in the next venue of care SLP Visit Diagnosis: Cognitive communication deficit (R41.841)    Follow  Up Recommendations  Skilled Nursing facility;24 hour  supervision/assistance       SLP Evaluation Cognition  Overall Cognitive Status: No family/caregiver present to determine baseline cognitive functioning Arousal/Alertness: Awake/alert Orientation Level: Oriented to person;Oriented to place;Disoriented to time;Disoriented to situation       Comprehension  Auditory Comprehension Overall Auditory Comprehension: Appears within functional limits for tasks assessed Reading Comprehension Reading Status: Not tested    Expression Expression Primary Mode of Expression: Verbal Verbal Expression Overall Verbal Expression: Appears within functional limits for tasks assessed Written Expression Dominant Hand: Right Written Expression: Not tested   Oral / Motor  Oral Motor/Sensory Function Overall Oral Motor/Sensory Function: Within functional limits Motor Speech Overall Motor Speech: Appears within functional limits for tasks assessed   GO                   Takera Rayl B. Murvin Natal, [facility], CCC-SLP Speech Language Pathologist Office: [phone]  [name] 07/23/2021, 3:16 PM

## 2021-07-24 LAB — COMPREHENSIVE METABOLIC PANEL
ALT: 5 U/L (ref 0–44)
AST: 16 U/L (ref 15–41)
Albumin: 1.6 g/dL — ABNORMAL LOW (ref 3.5–5.0)
Alkaline Phosphatase: 128 U/L — ABNORMAL HIGH (ref 38–126)
Anion gap: 15 (ref 5–15)
BUN: 29 mg/dL — ABNORMAL HIGH (ref 8–23)
CO2: 19 mmol/L — ABNORMAL LOW (ref 22–32)
Calcium: 8.4 mg/dL — ABNORMAL LOW (ref 8.9–10.3)
Chloride: 104 mmol/L (ref 98–111)
Creatinine, Ser: 1.13 mg/dL — ABNORMAL HIGH (ref 0.44–1.00)
GFR, Estimated: 51 mL/min — ABNORMAL LOW (ref >60)
Glucose, Bld: 86 mg/dL (ref 70–99)
Potassium: 3.6 mmol/L (ref 3.5–5.1)
Sodium: 138 mmol/L (ref 135–145)
Total Bilirubin: 1.9 mg/dL — ABNORMAL HIGH (ref 0.3–1.2)
Total Protein: 7 g/dL (ref 6.5–8.1)

## 2021-07-24 LAB — CBC
HCT: 26.4 % — ABNORMAL LOW (ref 36.0–46.0)
Hemoglobin: 8.5 g/dL — ABNORMAL LOW (ref 12.0–15.0)
MCH: 31.6 pg (ref 26.0–34.0)
MCHC: 32.2 g/dL (ref 30.0–36.0)
MCV: 98.1 fL (ref 80.0–100.0)
Platelets: 239 10*3/uL (ref 150–400)
RBC: 2.69 MIL/uL — ABNORMAL LOW (ref 3.87–5.11)
RDW: 17 % — ABNORMAL HIGH (ref 11.5–15.5)
WBC: 8.8 10*3/uL (ref 4.0–10.5)
nRBC: 0 % (ref 0.0–0.2)

## 2021-07-24 LAB — GLUCOSE, CAPILLARY
Glucose-Capillary: 75 mg/dL (ref 70–99)
Glucose-Capillary: 79 mg/dL (ref 70–99)
Glucose-Capillary: 90 mg/dL (ref 70–99)
Glucose-Capillary: 108 mg/dL — ABNORMAL HIGH (ref 70–99)
Glucose-Capillary: 85 mg/dL (ref 70–99)

## 2021-07-24 MED ORDER — CEFAZOLIN SODIUM-DEXTROSE 2-4 GM/100ML-% IV SOLN
2.0000 g | Freq: Three times a day (TID) | INTRAVENOUS | Status: DC
  Administered 2021-07-24 – 2021-08-06 (×39): 2 g via INTRAVENOUS
  Filled 2021-07-24 (×45): qty 100

## 2021-07-24 NOTE — Progress Notes (Addendum)
PHARMACY CONSULT NOTE FOR:  OUTPATIENT  PARENTERAL ANTIBIOTIC THERAPY (OPAT)  Indication: MSSA Wound/OM, Bacteremia Regimen: Cefazolin 2g IV q8h End date: 08/25/21  IV antibiotic discharge orders are pended. To discharging provider:  please sign these orders via discharge navigator,  Select New Orders & click on the button choice - Manage This Unsigned Work.     Thank you for allowing pharmacy to be a part of this patient's care.  Shirlee More, PharmD PGY2 Infectious Diseases Pharmacy Resident   Please check [email or web address] for unit-specific pharmacy phone numbers

## 2021-07-24 NOTE — Progress Notes (Signed)
Diagnosis: Vertebral (cervical/lumbar) osteomyelitis with epidural abscess Paraspinal muscle abscess Deconditioning Chronic dermatitis unclear etiology   Reviewed neurosurgery note and mri brain with contrast In retrospect her encephalopathy is related to delirium and not an organic primary cns infectious process  Previously some concern the dermatitis might have been exacerbated by rifampin, so holding  Overall treatment failure in setting of hardware is higher especially with staph aureus. Prognosis guarded  Can continue antibiotics for now given full care desire by family. Will switch back to cefazolin as no cns primary infectious process     Culture Result: mssa  No Known Allergies  OPAT Orders Discharge antibiotics to be given via PICC line Discharge antibiotics: cefazolin 2 gram iv q8hours Duration: original date to reevaluate 10/02, but will extend 2 weeks from 10/02 until 10/16 End Date: 10/16  Further evaluation (repeat mri spine) will be needed around 10/02 to see if can transition to oral abx  [facility] Care Per Protocol:  Home health RN for IV administration and teaching; PICC line care and labs.    Labs weekly while on IV antibiotics: _x_ CBC with differential __ BMP _x_ CMP _x_ CRP __ ESR __ Vancomycin trough __ CK  __ Please pull PIC at completion of IV antibiotics __ Please leave PIC in place until doctor has seen patient or been notified  Fax weekly labs to [phone]  Clinic Follow Up Appt: 9/30 @ 10am with Dr Gale Journey  @  [facility] [address], [place], [place] [address] Phone: [phone]

## 2021-07-24 NOTE — Plan of Care (Signed)
  Problem: Education: Goal: Knowledge of General Education information will improve Description: Including pain rating scale, medication(s)/side effects and non-pharmacologic comfort measures Outcome: Progressing   Problem: Health Behavior/Discharge Planning: Goal: Ability to manage health-related needs will improve Outcome: Progressing   Problem: Clinical Measurements: Goal: Ability to maintain clinical measurements within normal limits will improve Outcome: Progressing Goal: Will remain free from infection Outcome: Progressing Goal: Diagnostic test results will improve Outcome: Progressing Goal: Respiratory complications will improve Outcome: Progressing Goal: Cardiovascular complication will be avoided Outcome: Progressing   Problem: Activity: Goal: Risk for activity intolerance will decrease Outcome: Progressing   Problem: Nutrition: Goal: Adequate nutrition will be maintained Outcome: Progressing   Problem: Safety: Goal: Ability to remain free from injury will improve Outcome: Progressing   Problem: Skin Integrity: Goal: Risk for impaired skin integrity will decrease Outcome: Progressing   Problem: Elimination: Goal: Will not experience complications related to bowel motility Outcome: Progressing Goal: Will not experience complications related to urinary retention Outcome: Progressing

## 2021-07-24 NOTE — Progress Notes (Signed)
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  She was brought from the nursing facility due to shortness of breath, more confusion than usual baseline . In the ED: CT head showed acute/subacute possible pontine infarct, CT abdomen pelvis showed dystrophic changes inferior endplate of T12, L1 vertebral body, narrowing of the intervening intervertebral disc and a small amount of soft tissue thickening anteriorly, new finding compared to previous CTs and may represent sequelae of discitis/osteomyelitis, MRI lumbar C-spine was ordered and lab work showed leukocytosis 12.5, CT chest small amount of airspace consolidation versus atelectasis in the bilateral lung bases. ED called [facility] per daughter request but no bed, patient was admitted MRI brain positive for 1.5 cm? ?? acute to subacute ischemic nonhemorrhagic central pontine infarct. MRI C-spine and L spine done: Referto the report for more detail but shows persistent/recurrent osteomyelitis discitis at multiple levels, septic arthritis of SI joints with small soft tissue abscess. 9/11 Neurosurgery and ID was consulted-neurosurgery did not advise acute surgical intervention. Seen by speech and placed on  pured diet 9/13:Repeat MRI brain w/ contrast:no intracranial focus of abnormal contrast.  Subjective: Seen and examined. Alert awake follows simple commands able to tell me her name her date of birth Able to move her lower extremities squeeze with hands but grossly weak in all 4 extremities Afebrile overnight, on room air, no acute events. 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 In ED- Underwent MRI C and L-spine report details and seen by Dr. Maurice Small from neurosurgery-do not advise any surgery at this time,ID is following. Continue pain control. Blood cul.tures no growth so far.Keeping on Nafcillin for now  per ID.  Discussed with neurosurgery this morning. Recent Labs  Lab 07/20/21 1754 07/21/21 0500 07/22/21 0324 07/23/21 0352 07/24/21 0415  WBC 12.5* 12.6* 12.3* 8.7 8.8  LATICACIDVEN 1.3  --   --   --   --   Acute Metabolic encephalopathy: Appears to have subacute encephalopathy, mental state has cleared some on discharge on 9/1 and w/ intermittent confusion.  Here following simple commands however about self or date of birth and that she is in the hospital. Reportedly more confused at the facility and brought to the ED.Likely multifactorial in the  setting of her persistent spine infection question CAUTI  and due to her deconditioning.  Given history of alcohol use treating with high-dose iv thiamine, cont her bedime Zyprexa.Neurology on board  and input appreciated.  SLP eval done scored 17 out of 25 on MMSE 9/13.  Subacute pontine infarct: in ED abnormal non contrast MRI 1.5 cm 2/2 subacute ischemic likely affecting multiple penetrating arteries due to small vessel disease.MRI brain with contrast 9/12-no intracranial focus of abnormal contrast-enhancement.  Discussed with neurologist. Completed stroke w/u with up with A1c 5.1 , lipid panel  ldl 88, goal < 70,CT angio head and neck no LVO. Echo with EF 60 to 65%, no R WMA, normal mitral valve, aortic valve with mild AR.Plan to contine  ASA 81 mg, Lipitor 40 mg.  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  PT OT, supportive care. Augment Nutrition  CKD stage II- recent AKI- now creat slightly up discontinue toradol, cont gentle ivf Recent Labs  Lab 07/20/21 1820 07/21/21 0500 07/22/21 0324 07/23/21 0352 07/24/21 0415  BUN 24* 21 21 27* 29*  CREATININE 0.60 0.78 0.80 1.01* 1.13*    ? CAUTI Recurrent urine retention needing Foley catheter  sincne last admission Urien culture :30,000 COLONIES/mL KLEBSIELLA ORNITHINOLYTICA ,40,000 COLONIES/mL YEAST-??colonization.UA  wbc 21-50, LE moderate, nitrite +.  Follow-up  C/S, awit ID inputs  Bilateral lung base consolidation versus atelectasis,  less likely pneumonia,encourage incentive spirometry.  FEN/PEG tube in place continue tube feeding.  Speech has seen the patient and started on dysphagia 1/pured diet, RD consulted to adjust tube feeds.  Continue supportive care. Moderate Protein Calorie malnutrition: low albumin-continue to augment nutritional status has very low albumin level.   Hypoglycemia on tube feeding  and p.o. diet.  Off D5 water . Recent Labs  Lab 07/23/21 1647 07/23/21 2011 07/23/21 2315 07/24/21 0437 07/24/21 0736  GLUCAP 102* 98 82 75 79    Mildly elevated bilirubin:Monitor  Chronic dermatitis/chronic itching continue Atarax added Benadryl.  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 ID on board - rash worsening-could be from rifampin  to stopped  Hypertension blood pressure stable she has been on midodrine PTA, will continue the same   Chronic anemia Epistaxis on last admission where ENT  Dr Ezzard Standing - cauterized bleeding vessels in the right anterior nostril and aspirin/prophylactic heparin was stopped at that time and patient also had thrombocytopenia and anemia hemoglobin w/ hb up to 6.7 gmin the setting of epistaxis needing 2 unit transfusion. Monitor h/h as below. Recent Labs  Lab 07/20/21 1820 07/21/21 0500 07/22/21 0324 07/23/21 0352 07/24/21 0415  HGB 8.2* 7.9* 8.7* 8.0* 8.5*  HCT 24.0* 25.2* 28.4* 25.1* 26.4*    Goals of care: Currently full code.Given  her infectious process, encephalopathy, co-morbidities palliative care consult appreciated on board.  Of note: Patient was independent pror to last admission and only health issues was : heavy alcohol use" and now severely debilitated, daughter and family wants to pursue full aggressive care.  FEN: On dysphagia diet, Tube Feeds, encourage intake, augment diet.  Attempted transfer EDP called [facility] but no beds so admitted to [facility].  Per daughter request called [facility] transfer line 9/12 evening and was  called by by Dr Collie Siad and NOT accepted for transfer. Diet Order             DIET - DYS 1 Room service appropriate? Yes; Fluid consistency:  Thin  Diet effective now                   Patient's There is no height or weight on file to calculate BMI. DVT prophylaxis: enoxaparin (LOVENOX) injection 40 mg Start: 07/23/21 1545 SCDs Start: 07/21/21 0106. Lovenox added given her lack of mobility/high risk  of VTE ,daughter is very concerned too. Code Status:   Code Status: Full Code  Family Communication: plan of care discussed with patient and family at bedside 9/12.  Called daughter and updated  9/13-in agreement with our current plan of care.  Status is: Inpatient Remains inpatient appropriate because:Ongoing diagnostic testing needed not appropriate for outpatient work up, IV treatments appropriate due to intensity of illness or inability to take PO, and Inpatient level of care appropriate due to severity of illness Dispo: The patient is from: SNF              Anticipated d/c is to:TBD              Patient currently is not medically stable to d/c.   Difficult to place patient No  Unresulted Labs (From admission, onward)     Start     Ordered   07/30/21 0500  Creatinine, serum  (enoxaparin (LOVENOX)    CrCl >/= 30 ml/min)  Weekly,   R     Comments: while on enoxaparin therapy   Question:  Specimen collection method  Answer:  Lab=Lab collect   07/23/21 1456   07/25/21 0500  Basic metabolic panel  Daily,   R     Question:  Specimen collection method  Answer:  Lab=Lab collect   07/24/21 0747   07/25/21 0500  CBC  Daily,   R     Question:  Specimen collection method  Answer:  Lab=Lab collect   07/24/21 0747          Medications reviewed: Scheduled Meds:   stroke: mapping our early stages of recovery book   Does not apply Once   acetaminophen  650 mg Per Tube Q6H   allopurinol  100 mg Per Tube Daily   aspirin EC  81 mg Oral Daily   atorvastatin  40 mg Oral QHS   Chlorhexidine Gluconate Cloth  6 each Topical Daily   diclofenac Sodium  4 g Topical TID AC & HS   enoxaparin (LOVENOX) injection  40 mg Subcutaneous Q24H   feeding supplement (PROSource TF)  45 mL Per Tube TID   free water  125 mL Per Tube Q4H   lubriderm seriously sensitive   Topical BID   midodrine  5 mg Per Tube TID WC   OLANZapine  2.5 mg Oral QHS   sodium chloride flush  10-40 mL Intracatheter Q12H   [START ON  07/25/2021] thiamine injection  100 mg Intravenous Q8H   triamcinolone 0.1 % cream : eucerin   Topical BID   Continuous Infusions:  feeding supplement (OSMOLITE 1.5 CAL) 1,000 mL (07/23/21 0617)   lactated ringers 50 mL/hr at 07/23/21 1053   nafcillin IV 2 g (07/24/21 0910)   thiamine injection 400 mg (07/24/21 0550)   Consultants:see note  Procedures:see note Antimicrobials: Anti-infectives (From admission, onward)    Start     Dose/Rate Route Frequency Ordered Stop   07/22/21 2200  nafcillin 2 g in sodium chloride 0.9 % 100 mL IVPB        2 g 200 mL/hr over 30 Minutes Intravenous Every 4 hours 07/22/21 1320  07/22/21 1215  vancomycin (VANCOREADY) IVPB 1250 mg/250 mL  Status:  Discontinued        1,250 mg 166.7 mL/hr over 90 Minutes Intravenous Every 24 hours 07/22/21 1120 07/22/21 1320   07/21/21 1700  rifampin (RIFADIN) 300 mg in sodium chloride 0.9 % 100 mL IVPB  Status:  Discontinued        300 mg 200 mL/hr over 30 Minutes Intravenous Every 12 hours 07/21/21 1327 07/22/21 1014   07/21/21 1600  nafcillin 2 g in sodium chloride 0.9 % 100 mL IVPB  Status:  Discontinued        2 g 200 mL/hr over 30 Minutes Intravenous Every 4 hours 07/21/21 1455 07/22/21 1014   07/21/21 1330  nafcillin injection 2 g  Status:  Discontinued        2 g Intravenous Every 4 hours 07/21/21 1326 07/21/21 1455   07/21/21 0000  vancomycin (VANCOCIN) IVPB 1000 mg/200 mL premix  Status:  Discontinued        1,000 mg 200 mL/hr over 60 Minutes Intravenous Every 12 hours 07/20/21 2156 07/21/21 1326   07/20/21 2200  ceFEPIme (MAXIPIME) 2 g in sodium chloride 0.9 % 100 mL IVPB  Status:  Discontinued        2 g 200 mL/hr over 30 Minutes Intravenous Every 8 hours 07/20/21 2143 07/21/21 1326   07/20/21 2145  vancomycin (VANCOREADY) IVPB 1750 mg/350 mL        1,750 mg 175 mL/hr over 120 Minutes Intravenous  Once 07/20/21 2143 07/21/21 0308      Culture/Microbiology    Component Value Date/Time   SDES  URINE, CATHETERIZED 07/22/2021 1737   SPECREQUEST NONE 07/22/2021 1737   CULT (A) 07/22/2021 1737    30,000 COLONIES/mL KLEBSIELLA ORNITHINOLYTICA 40,000 COLONIES/mL YEAST SUSCEPTIBILITIES TO FOLLOW REPEATING Performed at [facility], [address], [place], [place] [address]    REPTSTATUS PENDING 07/22/2021 1737    Other culture-see note  Objective: Vitals: Today's Vitals   07/24/21 0000 07/24/21 0400 07/24/21 0716 07/24/21 0800  BP: (!) 144/71 (!) 147/72 (!) 157/73   Pulse: (!) 104 98 (!) 101   Resp: 17  18   Temp: 98.2 F (36.8 C) 97.9 F (36.6 C) 98 F (36.7 C)   TempSrc: Oral Oral Oral   SpO2: 100% 100% 100%   PainSc:  0-No pain  0-No pain    Intake/Output Summary (Last 24 hours) at 07/24/2021 1059 Last data filed at 07/24/2021 0645 Gross per 24 hour  Intake 820 ml  Output 2225 ml  Net -1405 ml   There were no vitals filed for this visit. Weight change:   Intake/Output from previous day: 09/13 0701 - 09/14 0700 In: 820 [I.V.:10; NG/GT:660; IV Piggyback:150] Out: 2225 [Urine:2225] Intake/Output this shift: No intake/output data recorded. There were no vitals filed for this visit.  Examination: General exam: Alert, awake oriented to self date of birth , not in distress, older than stated age, weak appearing. HEENT:Oral mucosa moist, Ear/Nose WNL grossly, dentition normal. Respiratory system: bilaterally clear, no use of accessory muscle Cardiovascular system: S1 & S2 +, No JVD,. Gastrointestinal system: Abdomen soft,PEG+-no drainage, NT,ND, BS+ Nervous System:Alert, awake, moving extremities and grossly nonfocal Extremities: no edema, distal peripheral pulses palpable.  Skin: Dry  wasted  and rawspinal epidural abscess with patient 11 hours o will switch over to Coumadin 11 [place] managed by f skin areas with excoriation marks no rashes,no icterus. MSK: Normal muscle bulk,tone, power    Data Reviewed:  I have personally reviewed following labs and  imaging studies CBC: Recent Labs  Lab 07/20/21 1754 07/20/21 1820 07/21/21 0500 07/22/21 0324 07/23/21 0352 07/24/21 0415  WBC 12.5*  --  12.6* 12.3* 8.7 8.8  NEUTROABS 8.8*  --   --   --   --   --   HGB 8.0* 8.2* 7.9* 8.7* 8.0* 8.5*  HCT 26.0* 24.0* 25.2* 28.4* 25.1* 26.4*  MCV 100.4*  --  99.6 100.4* 98.8 98.1  PLT 212  --  204 217 223 239   Basic Metabolic Panel: Recent Labs  Lab 07/20/21 1754 07/20/21 1820 07/21/21 0500 07/22/21 0324 07/23/21 0352 07/24/21 0415  NA 137 140 135 136 136 138  K 4.1 4.2 3.8 3.9 3.7 3.6  CL 105 105 104 106 105 104  CO2 23  --  21* 20* 20* 19*  GLUCOSE 79 80 86 71 101* 86  BUN 26* 24* 21 21 27* 29*  CREATININE 0.77 0.60 0.78 0.80 1.01* 1.13*  CALCIUM 8.7*  --  8.6* 8.7* 8.1* 8.4*   GFR: Estimated Creatinine Clearance: 47 mL/min (A) (by C-G formula based on SCr of 1.13 mg/dL (H)). Liver Function Tests: Recent Labs  Lab 07/20/21 1754 07/22/21 0324 07/23/21 0352 07/24/21 0415  AST 17 22 16 16   ALT 5 7 5 5   ALKPHOS 141* 112 103 128*  BILITOT 0.8 1.9* 1.7* 1.9*  PROT 7.4 7.0 6.3* 7.0  ALBUMIN 1.6* 1.6* 1.4* 1.6*   No results for input(s): LIPASE, AMYLASE in the last 168 hours. No results for input(s): AMMONIA in the last 168 hours. Coagulation Profile: No results for input(s): INR, PROTIME in the last 168 hours. Cardiac Enzymes: No results for input(s): CKTOTAL, CKMB, CKMBINDEX, TROPONINI in the last 168 hours. BNP (last 3 results) No results for input(s): PROBNP in the last 8760 hours. HbA1C: No results for input(s): HGBA1C in the last 72 hours.  CBG: Recent Labs  Lab 07/23/21 1647 07/23/21 2011 07/23/21 2315 07/24/21 0437 07/24/21 0736  GLUCAP 102* 98 82 75 79   Lipid Profile: No results for input(s): CHOL, HDL, LDLCALC, TRIG, CHOLHDL, LDLDIRECT in the last 72 hours.  Thyroid Function Tests: No results for input(s): TSH, T4TOTAL, FREET4, T3FREE, THYROIDAB in the last 72 hours. Anemia Panel: No results for  input(s): VITAMINB12, FOLATE, FERRITIN, TIBC, IRON, RETICCTPCT in the last 72 hours. Sepsis Labs: Recent Labs  Lab 07/20/21 1754  LATICACIDVEN 1.3    Recent Results (from the past 240 hour(s))  Resp Panel by RT-PCR (Flu A&B, Covid) Nasopharyngeal Swab     Status: None   Collection Time: 07/20/21  5:20 PM   Specimen: Nasopharyngeal Swab; Nasopharyngeal(NP) swabs in vial transport medium  Result Value Ref Range Status   SARS Coronavirus 2 by RT PCR NEGATIVE NEGATIVE Final    Comment: (NOTE) SARS-CoV-2 target nucleic acids are NOT DETECTED.  The SARS-CoV-2 RNA is generally detectable in upper respiratory specimens during the acute phase of infection. The lowest concentration of SARS-CoV-2 viral copies this assay can detect is 138 copies/mL. A negative result does not preclude SARS-Cov-2 infection and should not be used as the sole basis for treatment or other patient management decisions. A negative result may occur with  improper specimen collection/handling, submission of specimen other than nasopharyngeal swab, presence of viral mutation(s) within the areas targeted by this assay, and inadequate number of viral copies(<138 copies/mL). A negative result must be combined with clinical observations, patient history, and epidemiological information. The expected result is Negative.  Fact  Sheet for Patients:  BloggerCourse.com  Fact Sheet for Healthcare Providers:  SeriousBroker.it  This test is no t yet approved or cleared by the [place] FDA and  has been authorized for detection and/or diagnosis of SARS-CoV-2 by FDA under an Emergency Use Authorization (EUA). This EUA will remain  in effect (meaning this test can be used) for the duration of the COVID-19 declaration under Section 564(b)(1) of the Act, 21 U.S.C.section 360bbb-3(b)(1), unless the authorization is terminated  or revoked sooner.       Influenza A by PCR  NEGATIVE NEGATIVE Final   Influenza B by PCR NEGATIVE NEGATIVE Final    Comment: (NOTE) The Xpert Xpress SARS-CoV-2/FLU/RSV plus assay is intended as an aid in the diagnosis of influenza from Nasopharyngeal swab specimens and should not be used as a sole basis for treatment. Nasal washings and aspirates are unacceptable for Xpert Xpress SARS-CoV-2/FLU/RSV testing.  Fact Sheet for Patients: BloggerCourse.com  Fact Sheet for Healthcare Providers: SeriousBroker.it  This test is not yet approved or cleared by the [place] FDA and has been authorized for detection and/or diagnosis of SARS-CoV-2 by FDA under an Emergency Use Authorization (EUA). This EUA will remain in effect (meaning this test can be used) for the duration of the COVID-19 declaration under Section 564(b)(1) of the Act, 21 U.S.C. section 360bbb-3(b)(1), unless the authorization is terminated or revoked.  Performed at [facility], [address] [place], [place] [address]   Culture, blood (routine x 2)     Status: None (Preliminary result)   Collection Time: 07/20/21  6:06 PM   Specimen: BLOOD  Result Value Ref Range Status   Specimen Description BLOOD SITE NOT SPECIFIED  Final   Special Requests   Final    BOTTLES DRAWN AEROBIC AND ANAEROBIC Blood Culture adequate volume   Culture   Final    NO GROWTH 4 DAYS Performed at [facility], [address], [place], [place] [address]    Report Status PENDING  Incomplete  Culture, blood (routine x 2)     Status: None (Preliminary result)   Collection Time: 07/20/21  6:09 PM   Specimen: BLOOD  Result Value Ref Range Status   Specimen Description BLOOD SITE NOT SPECIFIED  Final   Special Requests   Final    BOTTLES DRAWN AEROBIC ONLY Blood Culture adequate volume   Culture   Final    NO GROWTH 4 DAYS Performed at [facility], [address] [place], [place] [address]    Report Status PENDING   Incomplete  MRSA Next Gen by PCR, Nasal     Status: None   Collection Time: 07/20/21  9:44 PM   Specimen: Nasal Mucosa; Nasal Swab  Result Value Ref Range Status   MRSA by PCR Next Gen NOT DETECTED NOT DETECTED Final    Comment: (NOTE) The GeneXpert MRSA Assay (FDA approved for NASAL specimens only), is one component of a comprehensive MRSA colonization surveillance program. It is not intended to diagnose MRSA infection nor to guide or monitor treatment for MRSA infections. Test performance is not FDA approved in patients less than 13 years old. Performed at [facility], [address], [place], [place] [address]   Urine Culture     Status: Abnormal (Preliminary result)   Collection Time: 07/22/21  5:37 PM   Specimen: Urine, Catheterized  Result Value Ref Range Status   Specimen Description URINE, CATHETERIZED  Final   Special Requests NONE  Final   Culture (A)  Final    30,000 COLONIES/mL KLEBSIELLA ORNITHINOLYTICA 40,000 COLONIES/mL YEAST SUSCEPTIBILITIES TO FOLLOW REPEATING Performed at [facility], [address] [place], [place] [address]    Report Status PENDING  Incomplete     Radiology Studies: MR BRAIN W CONTRAST  Result Date: 07/23/2021 CLINICAL DATA:  Meningitis/CNS infection suspected. EXAM: MRI HEAD WITH CONTRAST TECHNIQUE: Multiplanar, multiecho pulse sequences of the brain and surrounding structures were obtained with intravenous contrast. CONTRAST:  7.33mL GADAVIST GADOBUTROL 1 MMOL/ML IV SOLN COMPARISON:  MRI of the brain July 22, 2019. FINDINGS: Postcontrast images show no evidence of parenchymal or leptomeningeal focus of abnormal contrast. IMPRESSION: No intracranial focus of abnormal contrast enhancement. Electronically Signed   By: Baldemar Lenis M.D.   On: 07/23/2021 14:15     LOS: 4 days   Lanae Boast, MD [facility]  07/24/2021, 10:59 AM

## 2021-07-25 ENCOUNTER — Inpatient Hospital Stay: Payer: Self-pay

## 2021-07-25 LAB — BASIC METABOLIC PANEL
Anion gap: 10 (ref 5–15)
BUN: 29 mg/dL — ABNORMAL HIGH (ref 8–23)
CO2: 20 mmol/L — ABNORMAL LOW (ref 22–32)
Calcium: 8.4 mg/dL — ABNORMAL LOW (ref 8.9–10.3)
Chloride: 110 mmol/L (ref 98–111)
Creatinine, Ser: 0.84 mg/dL (ref 0.44–1.00)
GFR, Estimated: >60 mL/min (ref >60)
Glucose, Bld: 97 mg/dL (ref 70–99)
Potassium: 3.5 mmol/L (ref 3.5–5.1)
Sodium: 140 mmol/L (ref 135–145)

## 2021-07-25 LAB — URINE CULTURE: Culture: 30000 — AB

## 2021-07-25 LAB — CULTURE, BLOOD (ROUTINE X 2)
Culture: NO GROWTH
Culture: NO GROWTH
Special Requests: ADEQUATE
Special Requests: ADEQUATE

## 2021-07-25 LAB — GLUCOSE, CAPILLARY
Glucose-Capillary: 108 mg/dL — ABNORMAL HIGH (ref 70–99)
Glucose-Capillary: 65 mg/dL — ABNORMAL LOW (ref 70–99)
Glucose-Capillary: 77 mg/dL (ref 70–99)
Glucose-Capillary: 97 mg/dL (ref 70–99)
Glucose-Capillary: 98 mg/dL (ref 70–99)
Glucose-Capillary: 99 mg/dL (ref 70–99)

## 2021-07-25 LAB — CBC
HCT: 25.1 % — ABNORMAL LOW (ref 36.0–46.0)
Hemoglobin: 7.9 g/dL — ABNORMAL LOW (ref 12.0–15.0)
MCH: 31.2 pg (ref 26.0–34.0)
MCHC: 31.5 g/dL (ref 30.0–36.0)
MCV: 99.2 fL (ref 80.0–100.0)
Platelets: 255 10*3/uL (ref 150–400)
RBC: 2.53 MIL/uL — ABNORMAL LOW (ref 3.87–5.11)
RDW: 17.2 % — ABNORMAL HIGH (ref 11.5–15.5)
WBC: 10.4 10*3/uL (ref 4.0–10.5)
nRBC: 0 % (ref 0.0–0.2)

## 2021-07-25 MED ORDER — LOPERAMIDE HCL 1 MG/7.5ML PO SUSP
2.0000 mg | ORAL | Status: DC | PRN
  Administered 2021-07-25: 2 mg
  Filled 2021-07-25 (×2): qty 15

## 2021-07-25 MED ORDER — ASPIRIN 81 MG PO CHEW
81.0000 mg | CHEWABLE_TABLET | Freq: Every day | ORAL | Status: DC
  Administered 2021-07-25 – 2021-07-27 (×3): 81 mg via ORAL
  Filled 2021-07-25 (×3): qty 1

## 2021-07-25 MED ORDER — SODIUM CHLORIDE 0.9% FLUSH: 10.0000 mL | INTRAVENOUS | Status: DC | PRN

## 2021-07-25 NOTE — Progress Notes (Signed)
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  She was brought from the nursing facility due to shortness of breath, more confusion than usual baseline . In the ED: CT head showed acute/subacute possible pontine infarct, CT abdomen pelvis showed dystrophic changes inferior endplate of T12, L1 vertebral body, narrowing of the intervening intervertebral disc and a small amount of soft tissue thickening anteriorly, new finding compared to previous CTs and may represent sequelae of discitis/osteomyelitis, MRI lumbar C-spine was ordered and lab work showed leukocytosis 12.5, CT chest small amount of airspace consolidation versus atelectasis in the bilateral lung bases. ED called [facility] per daughter request but no bed, patient was admitted MRI brain positive for 1.5 cm? ?? acute to subacute ischemic nonhemorrhagic central pontine infarct. MRI C-spine and L spine done: Referto the report for more detail but shows persistent/recurrent osteomyelitis discitis at multiple levels, septic arthritis of SI joints with small soft tissue abscess. 9/11 Neurosurgery and ID was consulted-neurosurgery did not advise acute surgical intervention. Seen by speech and placed on  pured diet 9/13:Repeat MRI brain w/ contrast:no intracranial focus of abnormal contrast. 9/14 ID recommended to continue with cefazolin through 08/25/2021, outpatient ID follow-up.  Neurology and neurosurgery has signed off. Patient is being managed with PT OT and speech follow-up  Per daughter at home she was laying still on the bed,painful movements and was hurting and did not clearly say where the pain is coming from.After fall brought to the ED. 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 In ED- Underwent MRI C and L-spine report details and seen by Dr. Maurice Small from neurosurgery-do not advise any surgery at this time. Blood cultures no growth..  Seen by ID recommended to continue with cefazolin through 08/25/2021, outpatient ID follow-up.  No fever no leukocytosis at this time.  Acute Metabolic encephalopathy: Appears to have subacute encephalopathy, mental state has cleared some on discharge on 9/1 and w/ intermittent confusion.  Here following simple commands. Reportedly more confused at the facility and brought to the ED.Likely multifactorial in the setting of her persistent spine infection and deconditioning.  Given history of alcohol use being treated with high-dose  thiamine following we will continue on daily  thiamine.  Seen by neurology underwent MRI brain repeat with contrast no acute finding.SLP eval done scored 17 out of 25 on MMSE 9/13.  Continue supportive therapy PT OT speech  Subacute pontine infarct: in ED abnormal non contrast MRI 1.5 cm 2/2 subacute ischemic likely affecting multiple penetrating arteries due to small vessel disease.MRI brain with contrast 9/12-no intracranial focus of abnormal contrast-enhancement.  Discussed with neurologist. Completed stroke w/u with up with A1c 5.1 , lipid panel  ldl 88, goal < 70,CT angio head and neck no LVO. Echo with EF 60 to 65%, no R WMA, normal mitral valve, aortic valve with mild AR.Plan to contine  ASA 81 mg, Lipitor 40 mg.  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  Continue supportive therapy PT OT speech and dietitian follow-up  CKD stage II- recent AKI-renal function stable. Recent Labs  Lab 07/21/21 0500 07/22/21 0324 07/23/21 0352 07/24/21 0415 07/25/21 0318  BUN 21 21 27* 29* 29*  CREATININE 0.78 0.80 1.01* 1.13* 0.84    Diarrhea/loose stool: this am, she is on  tube feed, RD to evaluate to see if defect needs to be changed no abdominal pain no fever chills or leukocytosis.  We will keep on supportive care, as needed Imodium.  No CAUTI Recurrent urine retention needing Foley catheter  sincne last admission Urien culture :30,000 COLONIES/mL KLEBSIELLA ORNITHINOLYTICA ,40,000 COLONIES/mL YEAST-??colonization.UA  wbc 21-50, LE moderate, nitrite + suspect colonization.  Bilateral lung base consolidation versus atelectasis,  less likely pneumonia,encourage incentive spirometry.  FEN/PEG tube in place continue tube feeding.  Speech has seen the patient and started on dysphagia 1/pured diet, augment  nutrition or encourage oral intake.   Moderate Protein Calorie malnutrition: low albumin-continue to augment nutritional status has very low albumin level.   Hypoglycemia on tube feeding and p.o. diet.  Resolved. off D5 water . Recent Labs  Lab 07/24/21 1137 07/24/21 1520 07/24/21 2027 07/25/21 0016 07/25/21 0353  GLUCAP 90 108* 85 77 108*     Mildly elevated bilirubin:Monitor  Chronic dermatitis/chronic itching continue Atarax added Benadryl.  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 ID on board - rash worsening-could be from rifampin  to stopped  Hypertension stable.  She has been on midodrine PTA, blood pressure in 140s to 150s also mild tachycardia-decrease to 2.5 mg 3 times daily  Chronic anemia Epistaxis on last admission where ENT  Dr Ezzard Standing - cauterized bleeding vessels in the right anterior nostril and aspirin/prophylactic heparin was stopped at that time and patient also had thrombocytopenia and anemia hemoglobin w/ hb up to 6.7 gmin the setting of epistaxis needing 2 unit transfusion.  Overall stable here. Recent Labs  Lab 07/21/21 0500 07/22/21 0324 07/23/21 0352 07/24/21 0415 07/25/21 0318  HGB 7.9* 8.7* 8.0* 8.5* 7.9*  HCT 25.2* 28.4* 25.1* 26.4* 25.1*     Goals of care: Currently full code.Given  her infectious process, encephalopathy, co-morbidities palliative care consult appreciated on board.  Daughter has been stating " She was independent pror to last admission and only health issues was : heavy alcohol use" and now severely debilitated, daughter and family wants to pursue full aggressive care.  FEN: On dysphagia diet, Tube Feeds, encourage intake, augment diet.  Attempted transfer EDP called [facility] but no beds so admitted to [facility].  Per daughter request called [facility] transfer line 9/12 evening and was  called by by Dr Collie Siad and  NOT accepted for transfer. Diet Order             DIET - DYS 1 Room service appropriate? Yes; Fluid consistency:  Thin  Diet effective now                   Patient's There is no height or weight on file to calculate BMI. DVT prophylaxis: enoxaparin (LOVENOX) injection 40 mg Start: 07/23/21 1545 SCDs Start: 07/21/21 0106. Lovenox added given her lack of mobility/high risk of VTE ,daughter is very concerned too. Code Status:   Code Status: Full Code  Family Communication: plan of care discussed with patient and family at bedside 9/12.  Called daughter and updated  9/13-in agreement with our current plan of care. Discussed with the daughter in detail answer all the questions consult social worker for return to skilled nursing facility hopefully by tomorrow.  We will get PICC line in today  Status is: Inpatient Remains inpatient appropriate because:Ongoing diagnostic testing needed not appropriate for outpatient work up, IV treatments appropriate due to intensity of illness or inability to take PO, and Inpatient level of care appropriate due to severity of illness Dispo: The patient is from: SNF              Anticipated d/c is to:SNF hopefully in day or two,TOC consulted              Patient currently is not medically stable to d/c.   Difficult to place patient No  Unresulted Labs (From admission, onward)     Start     Ordered   07/30/21 0500  Creatinine, serum  (enoxaparin (LOVENOX)    CrCl >/= 30 ml/min)  Weekly,   R     Comments: while on enoxaparin therapy   Question:  Specimen collection method  Answer:  Lab=Lab collect   07/23/21 1456   07/25/21 0500  Basic metabolic panel  Daily,   R     Question:  Specimen collection method  Answer:  Lab=Lab collect   07/24/21 0747   07/25/21 0500  CBC  Daily,   R     Question:  Specimen collection method  Answer:  Lab=Lab collect   07/24/21 0747          Medications reviewed: Scheduled Meds:   stroke: mapping our early stages of recovery book   Does not apply Once   acetaminophen  650 mg Per Tube Q6H   allopurinol  100 mg Per Tube Daily    aspirin EC  81 mg Oral Daily   atorvastatin  40 mg Oral QHS   Chlorhexidine Gluconate Cloth  6 each Topical Daily   diclofenac Sodium  4 g Topical TID AC & HS   enoxaparin (LOVENOX) injection  40 mg Subcutaneous Q24H   feeding supplement (PROSource TF)  45 mL Per Tube TID   free water  125 mL Per Tube Q4H   lubriderm seriously sensitive   Topical BID   midodrine  5 mg Per Tube TID WC   OLANZapine  2.5 mg Oral QHS   sodium chloride flush  10-40 mL Intracatheter Q12H   thiamine injection  100 mg Intravenous Q8H   triamcinolone 0.1 % cream : eucerin   Topical BID   Continuous Infusions:   ceFAZolin (ANCEF) IV 2 g (07/25/21 0707)   feeding supplement (OSMOLITE 1.5 CAL) 1,000 mL (07/23/21 0617)   lactated ringers 50 mL/hr at 07/24/21 1806   thiamine injection 400 mg (07/25/21 0617)  Consultants:see note  Procedures:see note Antimicrobials: Anti-infectives (From admission, onward)    Start     Dose/Rate Route Frequency Ordered Stop   07/24/21 1400  ceFAZolin (ANCEF) IVPB 2g/100 mL premix        2 g 200 mL/hr over 30 Minutes Intravenous Every 8 hours 07/24/21 1150 08/25/21 1359   07/22/21 2200  nafcillin 2 g in sodium chloride 0.9 % 100 mL IVPB  Status:  Discontinued        2 g 200 mL/hr over 30 Minutes Intravenous Every 4 hours 07/22/21 1320 07/24/21 1150   07/22/21 1215  vancomycin (VANCOREADY) IVPB 1250 mg/250 mL  Status:  Discontinued        1,250 mg 166.7 mL/hr over 90 Minutes Intravenous Every 24 hours 07/22/21 1120 07/22/21 1320   07/21/21 1700  rifampin (RIFADIN) 300 mg in sodium chloride 0.9 % 100 mL IVPB  Status:  Discontinued        300 mg 200 mL/hr over 30 Minutes Intravenous Every 12 hours 07/21/21 1327 07/22/21 1014   07/21/21 1600  nafcillin 2 g in sodium chloride 0.9 % 100 mL IVPB  Status:  Discontinued        2 g 200 mL/hr over 30 Minutes Intravenous Every 4 hours 07/21/21 1455 07/22/21 1014   07/21/21 1330  nafcillin injection 2 g  Status:  Discontinued        2  g Intravenous Every 4 hours 07/21/21 1326 07/21/21 1455   07/21/21 0000  vancomycin (VANCOCIN) IVPB 1000 mg/200 mL premix  Status:  Discontinued        1,000 mg 200 mL/hr over 60 Minutes Intravenous Every 12 hours 07/20/21 2156 07/21/21 1326   07/20/21 2200  ceFEPIme (MAXIPIME) 2 g in sodium chloride 0.9 % 100 mL IVPB  Status:  Discontinued        2 g 200 mL/hr over 30 Minutes Intravenous Every 8 hours 07/20/21 2143 07/21/21 1326   07/20/21 2145  vancomycin (VANCOREADY) IVPB 1750 mg/350 mL        1,750 mg 175 mL/hr over 120 Minutes Intravenous  Once 07/20/21 2143 07/21/21 0308      Culture/Microbiology    Component Value Date/Time   SDES URINE, CATHETERIZED 07/22/2021 1737   SPECREQUEST NONE 07/22/2021 1737   CULT (A) 07/22/2021 1737    30,000 COLONIES/mL KLEBSIELLA ORNITHINOLYTICA 40,000 COLONIES/mL YEAST SUSCEPTIBILITIES TO FOLLOW REPEATING Performed at [facility], [address], [place], [place] [address]    REPTSTATUS PENDING 07/22/2021 1737    Other culture-see note  Objective: Vitals: Today's Vitals   07/24/21 1600 07/24/21 2022 07/25/21 0003 07/25/21 0352  BP:  (!) 149/85 (!) 148/72 (!) 146/83  Pulse:  (!) 104 (!) 101 (!) 110  Resp:  Temp:  98.3 F (36.8 C) 98.6 F (37 C) 99.2 F (37.3 C)  TempSrc:  Oral Oral Oral  SpO2:  99% 100% 100%  PainSc: 0-No pain 0-No pain 5  Asleep    Intake/Output Summary (Last 24 hours) at 07/25/2021 6045 Last data filed at 07/24/2021 1656 Gross per 24 hour  Intake --  Output 1500 ml  Net -1500 ml    There were no vitals filed for this visit. Weight change:   Intake/Output from previous day: 09/14 0701 - 09/15 0700 In: -  Out: 1500 [Urine:1500] Intake/Output this shift: No intake/output data recorded. There were no vitals filed for this visit.  Examination: General exam: AAOx who oriented to self,older than stated age, weak  appearing. HEENT:Oral mucosa moist, Ear/Nose WNL grossly, dentition  normal. Respiratory system: bilaterally clear breath sounds, ,no use of accessory muscle Cardiovascular system: S1 & S2 +, No JVD,. Gastrointestinal system: Abdomen soft, PEG tube present NT,ND, BS+ Nervous System:Alert, awake, moving extremities and grossly weak in 4 extremities Extremities: No edema, distal peripheral pulses palpable.  Skin: Raw areas from itching, dry skin, no icterus. MSK: Normal muscle bulk,tone, power    Data Reviewed: I have personally reviewed following labs and imaging studies CBC: Recent Labs  Lab 07/20/21 1754 07/20/21 1820 07/21/21 0500 07/22/21 0324 07/23/21 0352 07/24/21 0415 07/25/21 0318  WBC 12.5*  --  12.6* 12.3* 8.7 8.8 10.4  NEUTROABS 8.8*  --   --   --   --   --   --   HGB 8.0*   < > 7.9* 8.7* 8.0* 8.5* 7.9*  HCT 26.0*   < > 25.2* 28.4* 25.1* 26.4* 25.1*  MCV 100.4*  --  99.6 100.4* 98.8 98.1 99.2  PLT 212  --  204 217 223 239 255   < > = values in this interval not displayed.    Basic Metabolic Panel: Recent Labs  Lab 07/21/21 0500 07/22/21 0324 07/23/21 0352 07/24/21 0415 07/25/21 0318  NA 135 136 136 138 140  K 3.8 3.9 3.7 3.6 3.5  CL 104 106 105 104 110  CO2 21* 20* 20* 19* 20*  GLUCOSE 86 71 101* 86 97  BUN 21 21 27* 29* 29*  CREATININE 0.78 0.80 1.01* 1.13* 0.84  CALCIUM 8.6* 8.7* 8.1* 8.4* 8.4*    GFR: Estimated Creatinine Clearance: 63.2 mL/min (by C-G formula based on SCr of 0.84 mg/dL). Liver Function Tests: Recent Labs  Lab 07/20/21 1754 07/22/21 0324 07/23/21 0352 07/24/21 0415  AST 17 22 16 16   ALT 5 7 5 5   ALKPHOS 141* 112 103 128*  BILITOT 0.8 1.9* 1.7* 1.9*  PROT 7.4 7.0 6.3* 7.0  ALBUMIN 1.6* 1.6* 1.4* 1.6*    No results for input(s): LIPASE, AMYLASE in the last 168 hours. No results for input(s): AMMONIA in the last 168 hours. Coagulation Profile: No results for input(s): INR, PROTIME in the last 168 hours. Cardiac Enzymes: No results for input(s): CKTOTAL, CKMB, CKMBINDEX, TROPONINI in the  last 168 hours. BNP (last 3 results) No results for input(s): PROBNP in the last 8760 hours. HbA1C: No results for input(s): HGBA1C in the last 72 hours.  CBG: Recent Labs  Lab 07/24/21 1137 07/24/21 1520 07/24/21 2027 07/25/21 0016 07/25/21 0353  GLUCAP 90 108* 85 77 108*    Lipid Profile: No results for input(s): CHOL, HDL, LDLCALC, TRIG, CHOLHDL, LDLDIRECT in the last 72 hours.  Thyroid Function Tests: No results for input(s): TSH, T4TOTAL, FREET4, T3FREE, THYROIDAB in the last 72 hours. Anemia Panel: No results for input(s): VITAMINB12, FOLATE, FERRITIN, TIBC, IRON, RETICCTPCT in the last 72 hours. Sepsis Labs: Recent Labs  Lab 07/20/21 1754  LATICACIDVEN 1.3     Recent Results (from the past 240 hour(s))  Resp Panel by RT-PCR (Flu A&B, Covid) Nasopharyngeal Swab     Status: None   Collection Time: 07/20/21  5:20 PM   Specimen: Nasopharyngeal Swab; Nasopharyngeal(NP) swabs in vial transport medium  Result Value Ref Range Status   SARS Coronavirus 2 by RT PCR NEGATIVE NEGATIVE Final    Comment: (NOTE) SARS-CoV-2 target nucleic acids are NOT DETECTED.  The SARS-CoV-2 RNA is generally detectable in upper respiratory specimens during the acute phase of infection. The lowest concentration  of SARS-CoV-2 viral copies this assay can detect is 138 copies/mL. A negative result does not preclude SARS-Cov-2 infection and should not be used as the sole basis for treatment or other patient management decisions. A negative result may occur with  improper specimen collection/handling, submission of specimen other than nasopharyngeal swab, presence of viral mutation(s) within the areas targeted by this assay, and inadequate number of viral copies(<138 copies/mL). A negative result must be combined with clinical observations, patient history, and epidemiological information. The expected result is Negative.  Fact Sheet for Patients:   BloggerCourse.com  Fact Sheet for Healthcare Providers:  SeriousBroker.it  This test is no t yet approved or cleared by the [place] FDA and  has been authorized for detection and/or diagnosis of SARS-CoV-2 by FDA under an Emergency Use Authorization (EUA). This EUA will remain  in effect (meaning this test can be used) for the duration of the COVID-19 declaration under Section 564(b)(1) of the Act, 21 U.S.C.section 360bbb-3(b)(1), unless the authorization is terminated  or revoked sooner.       Influenza A by PCR NEGATIVE NEGATIVE Final   Influenza B by PCR NEGATIVE NEGATIVE Final    Comment: (NOTE) The Xpert Xpress SARS-CoV-2/FLU/RSV plus assay is intended as an aid in the diagnosis of influenza from Nasopharyngeal swab specimens and should not be used as a sole basis for treatment. Nasal washings and aspirates are unacceptable for Xpert Xpress SARS-CoV-2/FLU/RSV testing.  Fact Sheet for Patients: BloggerCourse.com  Fact Sheet for Healthcare Providers: SeriousBroker.it  This test is not yet approved or cleared by the [place] FDA and has been authorized for detection and/or diagnosis of SARS-CoV-2 by FDA under an Emergency Use Authorization (EUA). This EUA will remain in effect (meaning this test can be used) for the duration of the COVID-19 declaration under Section 564(b)(1) of the Act, 21 U.S.C. section 360bbb-3(b)(1), unless the authorization is terminated or revoked.  Performed at [facility], [address] [place], [place] [address]   Culture, blood (routine x 2)     Status: None (Preliminary result)   Collection Time: 07/20/21  6:06 PM   Specimen: BLOOD  Result Value Ref Range Status   Specimen Description BLOOD SITE NOT SPECIFIED  Final   Special Requests   Final    BOTTLES DRAWN AEROBIC AND ANAEROBIC Blood Culture adequate volume   Culture    Final    NO GROWTH 4 DAYS Performed at [facility], [address], [place], [place] [address]    Report Status PENDING  Incomplete  Culture, blood (routine x 2)     Status: None (Preliminary result)   Collection Time: 07/20/21  6:09 PM   Specimen: BLOOD  Result Value Ref Range Status   Specimen Description BLOOD SITE NOT SPECIFIED  Final   Special Requests   Final    BOTTLES DRAWN AEROBIC ONLY Blood Culture adequate volume   Culture   Final    NO GROWTH 4 DAYS Performed at [facility], [address] [place], [place] [address]    Report Status PENDING  Incomplete  MRSA Next Gen by PCR, Nasal     Status: None   Collection Time: 07/20/21  9:44 PM   Specimen: Nasal Mucosa; Nasal Swab  Result Value Ref Range Status   MRSA by PCR Next Gen NOT DETECTED NOT DETECTED Final    Comment: (NOTE) The GeneXpert MRSA Assay (FDA approved for NASAL specimens only), is one component of a comprehensive MRSA  colonization surveillance program. It is not intended to diagnose MRSA infection nor to guide or monitor treatment for MRSA infections. Test performance is not FDA approved in patients less than 65 years old. Performed at [facility], [address] [place], [place] [address]   Urine Culture     Status: Abnormal (Preliminary result)   Collection Time: 07/22/21  5:37 PM   Specimen: Urine, Catheterized  Result Value Ref Range Status   Specimen Description URINE, CATHETERIZED  Final   Special Requests NONE  Final   Culture (A)  Final    30,000 COLONIES/mL KLEBSIELLA ORNITHINOLYTICA 40,000 COLONIES/mL YEAST SUSCEPTIBILITIES TO FOLLOW REPEATING Performed at [facility], [address], [place], [place] [address]    Report Status PENDING  Incomplete      Radiology Studies: MR BRAIN W CONTRAST  Result Date: 07/23/2021 CLINICAL DATA:  Meningitis/CNS infection suspected. EXAM: MRI HEAD WITH CONTRAST TECHNIQUE: Multiplanar, multiecho pulse sequences of the brain  and surrounding structures were obtained with intravenous contrast. CONTRAST:  7.55mL GADAVIST GADOBUTROL 1 MMOL/ML IV SOLN COMPARISON:  MRI of the brain July 22, 2019. FINDINGS: Postcontrast images show no evidence of parenchymal or leptomeningeal focus of abnormal contrast. IMPRESSION: No intracranial focus of abnormal contrast enhancement. Electronically Signed   By: Baldemar Lenis M.D.   On: 07/23/2021 14:15     LOS: 5 days   Lanae Boast, MD [facility]  07/25/2021, 7:21 AM

## 2021-07-25 NOTE — Progress Notes (Signed)
Occupational Therapy Treatment Note  Excellent session today! Pt premedicated and seen as co-treat with PT. Able to progress to EOB with pt assisting by helping to move legs to EOB. Pt initially painful with sitting, requiring Max A and use of bed pad to mobilize, however able to progress with minguard A while seated EOB at least 20 min during functional ADL activity and exercise. While seated, pt rubbing lotion on B LE and UE with assist. Pt able to bring cup to mouth/wash face with mod A due to weak shoulders and elbow edema. Increased movement BUE, R >L elbow - hands; shoulder AROM remains limited to minimal shoulder flexion. Pt continues to demonstrate confusion however cognition significantly improved and Pt alert, interactive and demonstrating improved situational awareness. Daughter called at end of session to update on significant improvement and was very Adult nurse.  Encourage nsg to use chair position @ bed level at least TID; keep BUE elevated on pillows to help with edema and reorient frequently, blinds open during the day with lights on to help reduce hospital related delirium. Goal is to progress OOB to chair with use of lift equipment. Pt wants to go outside.     07/25/21 1700  OT Visit Information  Last OT Received On 07/25/21  Assistance Needed +2  PT/OT/SLP Co-Evaluation/Treatment Yes  Reason for Co-Treatment Complexity of the patient's impairments (multi-system involvement);Necessary to address cognition/behavior during functional activity;For patient/therapist safety;To address functional/ADL transfers  OT goals addressed during session ADL's and self-care  History of Present Illness 75 y.o. woman who presented to the ED from SNF 9/11 due to increased confusion and SOB. MRI (+) pontine infarct. Concern for Pneumonia and probably CAUTI. 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 Previously admitted to 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 PMH: HTN, polycthemia, alcohol use.  Precautions  Precautions Fall;Cervical;Other (comment) (at risk for skin breakdown)  Precaution Booklet Issued No  Precaution Comments PEG; indwelling foley  Required Braces or Orthoses  (Pt has B Prevalon boots at SNF form prior admission - educated daughter on need to bring those in if she does not DC back to her SNF soon)  Pain Assessment  Pain Assessment Faces  Faces Pain Scale 4  Pain Location L>R knee, R elbow when attempting to self-feed  Pain Descriptors / Indicators Discomfort;Grimacing;Guarding;Restless  Pain Intervention(s) Limited activity within patient's tolerance;Repositioned;Premedicated before session  Cognition  Arousal/Alertness Awake/alert (at times closing eyes but still responsive/verbal)  Behavior During Therapy Restless  Overall Cognitive Status Impaired/Different from baseline  Area of Impairment Orientation;Attention;Memory;Following commands;Safety/judgement;Awareness;Problem solving  Orientation Level Disoriented to;Time;Situation  Current Attention Level Focused;Sustained  Memory Decreased recall of precautions;Decreased short-term memory  Following Commands Follows one step commands with increased time;Follows multi-step commands inconsistently  Safety/Judgement Decreased awareness of safety;Decreased awareness of deficits  Awareness Intellectual  Problem Solving Slow processing;Difficulty sequencing;Requires verbal cues;Requires tactile cues  General Comments significant improvement; increased alertness; confused at times however  able to engage adn follow commands to assist with ADL tasks; pt approiprately stating she wants to get stronger; Asked ot push her out on her front porch  ADL  Functional mobility during  ADLs Maximal assistance;+2 for physical assistance  General ADL Comments Max A for drinking; unable to bring hand to mouth due to UE weakness; able to sit unsupported EOB adn rub lotion on BLE adn UE  Bed Mobility  Overal bed mobility Needs Assistance  Bed Mobility Supine to Sit;Sit to Supine  Supine to sit Mod assist;+2 for physical assistance;HOB elevated  Sit to supine Max assist;+2 for physical assistance  General bed mobility comments pt able to initiate scooting BLE toward EOB and reaching up to railing/therapist to rise; upon sitting, needs increased support maxA progressing to min guard  Balance  Overall balance assessment Needs assistance  Sitting-balance support Single extremity supported;Bilateral upper extremity supported;Feet supported  Sitting balance-Leahy Scale Poor  Sitting balance - Comments variable; initial maxA trunk support, progressing to min guard/close Supervision, pt needing intermittent minA during dynamic tasks and with fatigue; tolerated sitting EOB ~20 mins total  Postural control Right lateral lean  Standing balance support Bilateral upper extremity supported  Standing balance-Leahy Scale Zero  Standing balance comment +2 maxA with chuck pad and B HHA; unable to weight shift  Restrictions  Other Position/Activity Restrictions no brace needed (from prior ACDF)  Transfers  Overall transfer level Needs assistance  Equipment used 2 person hand held assist  Transfers Squat Pivot Transfers;Sit to/from Stand  Sit to Stand Max assist;+2 physical assistance;From elevated surface  Squat pivot transfers Max assist;+2 physical assistance;From elevated surface  General transfer comment partial stand at bedside with transfer pad and +2 assist, unable to weight shift, B feet/knees blocked  for safety; after brief rest break, pt performed single squat pivot toward HOB with +2 maxA and transfer pad support (NA this date)  Exercises  Exercises General Upper Extremity  General Exercises - Upper Extremity  Shoulder Flexion AROM;AAROM;Both;10 reps;Seated  Shoulder ABduction AROM;AAROM;Both;10 reps;Seated  Elbow Flexion AROM;AAROM;Both;10 reps;Seated (complains of papin @ elbow with flexion)  Elbow Extension AROM;AAROM;Both;10 reps;Seated  Wrist Flexion AROM;AAROM;Both;10 reps;Seated  Wrist Extension AROM;AAROM;Both;10 reps;Seated  Digit Composite Flexion AROM;AAROM;Strengthening;Both;20 reps;Seated  Composite Extension AAROM;AROM;Strengthening;Both;15 reps;Seated  OT - End of Session  Activity Tolerance Patient tolerated treatment well  Patient left in bed;with call bell/phone within reach;with bed alarm set;Other (comment) (modified chair position)  Nurse Communication Mobility status  OT Assessment/Plan  OT Plan Discharge plan remains appropriate;Frequency remains appropriate  OT Visit Diagnosis Unsteadiness on feet (R26.81);Other abnormalities of gait and mobility (R26.89);Muscle weakness (generalized) (M62.81);History of falling (Z91.81);Other symptoms and signs involving cognitive function;Pain  Pain - Right/Left  (neck; elbows; legs)  OT Frequency (ACUTE ONLY) Min 2X/week  Follow Up Recommendations SNF  OT Equipment None recommended by OT  AM-PAC OT "6 Clicks" Daily Activity Outcome Measure (Version 2)  Help from another person eating meals? 2  Help from another person taking care of personal grooming? 2  Help from another person toileting, which includes using toliet, bedpan, or urinal? 1  Help from another person bathing (including washing, rinsing, drying)? 2  Help from another person to put on and taking off regular upper body clothing? 1  Help from another person to put on and taking off regular lower body clothing? 1  6 Click Score 9  Progressive Mobility   What is the highest level of mobility based on the progressive mobility assessment? Level 2 (Chairfast) - Balance while sitting on edge of bed and cannot stand  Mobility Sit up in bed/chair position for meals  OT Goal Progression  Progress towards OT goals Progressing toward goals  Acute Rehab OT  Goals  Patient Stated Goal To get walking and go home  OT Goal Formulation With patient/family  Time For Goal Achievement 08/05/21  Potential to Achieve Goals Fair  ADL Goals  Pt Will Perform Eating with mod assist;with adaptive utensils;sitting  Pt Will Perform Grooming with max assist;bed level  Pt Will Perform Upper Body Bathing with max assist;bed level  Pt Will Transfer to Toilet with +2 assist;with mod assist;bedside commode;ambulating  Pt/caregiver will Perform Home Exercise Program Increased ROM;Both right and left upper extremity;With written HEP provided;Independently  Additional ADL Goal #1 Pt will tolerate HOB @ 60 x 20 min to increase tolerance for upright sitting  Additional ADL Goal #2 Pt will complete hand to mouth pattern with Max A to increase independence with grooming  Additional ADL Goal #3 Pt will complete hand to mouth pattern with BUE and Mod A in prep for self-feeding.  OT Time Calculation  OT Start Time (ACUTE ONLY) 1502  OT Stop Time (ACUTE ONLY) 1540  OT Time Calculation (min) 38 min  OT General Charges  $OT Visit 1 Visit  OT Treatments  $Self Care/Home Management  8-22 mins  $Neuromuscular Re-education 8-22 mins  Luisa Dago, OT/L   Acute OT Clinical Specialist Acute Rehabilitation Services Pager [phone] Office [phone]

## 2021-07-25 NOTE — Progress Notes (Signed)
Peripherally Inserted Central Catheter Placement  The IV Nurse has discussed with the patient and/or persons authorized to consent for the patient, the purpose of this procedure and the potential benefits and risks involved with this procedure.  The benefits include less needle sticks, lab draws from the catheter, and the patient may be discharged home with the catheter. Risks include, but not limited to, infection, bleeding, blood clot (thrombus formation), and puncture of an artery; nerve damage and irregular heartbeat and possibility to perform a PICC exchange if needed/ordered by physician.  Alternatives to this procedure were also discussed.  Bard Power PICC patient education guide, fact sheet on infection prevention and patient information card has been provided to patient /or left at bedside.    PICC Placement Documentation  PICC Single Lumen 07/25/21 Left Basilic (Active)  Indication for Insertion or Continuance of Line Home intravenous therapies (PICC only) 07/25/21 1156  Exposed Catheter (cm) 0 cm 07/25/21 1156  Site Assessment Clean;Dry;Intact 07/25/21 1156  Line Status Flushed;Saline locked;Blood return noted 07/25/21 1156  Dressing Type Transparent;Securing device 07/25/21 1156  Dressing Status Clean;Dry;Intact 07/25/21 1156  Antimicrobial disc in place? Yes 07/25/21 1156  Safety Lock Not Applicable 07/25/21 1156  Dressing Change Due 08/01/21 07/25/21 1156       [place], [place] 07/25/2021, 11:59 AM

## 2021-07-25 NOTE — Progress Notes (Signed)
Physical Therapy Treatment Patient Details Name: CAROLE DONER MRN: [id number] DOB: 03/02/1946 Today's Date: 07/25/2021   History of Present Illness 75 y.o. woman who presented to the ED from SNF 9/11 due to increased confusion and SOB. MRI (+) pontine infarct. Concern for Pneumonia and probably CAUTI. 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 PMH: HTN, polycthemia, alcohol use.    PT Comments    Pt received in supine, alert and oriented to self and agreeable to therapy session. Pt motivated to get OOB with decreased insight into deficits but with good participation for seated balance tasks and transfer training at bedside with emphasis on midline seated balance/posture, dynamic reaching activity, sit<>stand trials for neuromuscular re-education and strengthening and pressure relief strategies. Pt with multiple small areas of skin breakdown to back but especially to L lower back over hip, skin cleaned and barrier cream applied for comfort. Pt c/o itching, student RN notified. Pt continues to benefit from PT services to progress toward functional mobility goals.   Recommendations for follow up therapy are one component of a multi-disciplinary discharge planning process, led by the attending physician.  Recommendations may be updated based on patient status, additional functional criteria and insurance authorization.  Follow Up Recommendations  SNF;Supervision/Assistance - 24 hour     Equipment  Recommendations  Other (comment) (TBA)    Recommendations for Other Services       Precautions / Restrictions Precautions Precautions: Fall;Cervical;Other (comment) (at risk for skin breakdown) Precaution Booklet Issued: No Precaution Comments: PEG; indwelling foley Required Braces or Orthoses:  (Pt has B Prevalon boots at SNF form prior admission - educated daughter on need to bring those in if she does not DC back to her SNF soon) Restrictions Weight Bearing Restrictions: No Other Position/Activity Restrictions: no brace needed (from prior ACDF)     Mobility  Bed Mobility Overal bed mobility: Needs Assistance Bed Mobility: Supine to Sit;Sit to Supine     Supine to sit: Mod assist;+2 for physical assistance;HOB elevated Sit to supine: Max assist;+2 for physical assistance   General bed mobility comments: pt able to initiate scooting BLE toward EOB and reaching up to railing/therapist to rise; upon sitting, needs increased support maxA progressing to min guard    Transfers Overall transfer level: Needs assistance Equipment used: 2 person hand held assist Transfers: Squat Pivot Transfers;Sit to/from Stand Sit to Stand: Max assist;+2 physical assistance;From elevated surface   Squat pivot transfers: Max assist;+2 physical assistance;From elevated surface     General transfer comment: partial stand at bedside with transfer pad and +2 assist, unable to weight shift, B feet/knees blocked for safety; after brief rest break, pt performed single squat pivot toward HOB with +2 maxA and transfer pad support (NA this date)  Ambulation/Gait                 Stairs             Wheelchair Mobility    Modified Rankin (Stroke Patients Only)       Balance Overall balance assessment: Needs assistance Sitting-balance support: Single extremity  supported;Bilateral upper extremity supported;Feet supported Sitting balance-Leahy Scale: Poor Sitting balance - Comments:  variable; initial maxA trunk support, progressing to min guard/close Supervision, pt needing intermittent minA during dynamic tasks and with fatigue; tolerated sitting EOB ~20 mins total Postural control: Right lateral lean Standing balance support: Bilateral upper extremity supported Standing balance-Leahy Scale: Zero Standing balance comment: +2 maxA with chuck pad and B HHA; unable to weight shift                            Cognition Arousal/Alertness: Awake/alert (at times closing eyes but still responsive/verbal) Behavior During Therapy: Restless Overall Cognitive Status: Impaired/Different from baseline Area of Impairment: Orientation;Attention;Memory;Following commands;Safety/judgement;Awareness;Problem solving                 Orientation Level: Disoriented to;Time;Situation Current Attention Level: Focused Memory: Decreased recall of precautions;Decreased short-term memory Following Commands: Follows one step commands with increased time;Follows multi-step commands inconsistently Safety/Judgement: Decreased awareness of safety;Decreased awareness of deficits Awareness: Intellectual Problem Solving: Slow processing;Difficulty sequencing;Requires verbal cues;Requires tactile cues General Comments: Pt more alert and with more sustained attention and motivation to participate today, pt at times not oriented to location and needs cues due to internal distraction. Pt with decreased insight into deficits but cooperative with encouragement. Pt states "I want to walk home, I can walk home. I did it a week or two ago." (pt has been non ambulatory at least ~5 weeks per chart review)      Exercises      General Comments General comments (skin integrity, edema, etc.): pt with notable skin breakdown to lower back, especially L side and c/o feeling itchy throughout. Pt able to lean/reach B knees and calves to apply lotion for dynamic reaching activity; Student RN notified of pt  skin breakdown/itching, pt may benefit from foam pad at lower L hip; back and underarms cleaned with washcloth and barrier cream applied for comfort      Pertinent Vitals/Pain Pain Assessment: Faces Faces Pain Scale: Hurts little more Pain Location: L>R knee, R elbow when attempting to self-feed Pain Descriptors / Indicators: Discomfort;Grimacing;Guarding;Restless Pain Intervention(s): Monitored during session;Premedicated before session;Repositioned (oxycodone ~15 mins prior to session)    Home Living                      Prior Function            PT Goals (current goals can now be found in the care plan section) Acute Rehab PT Goals Patient Stated Goal: To get walking and go home PT Goal Formulation: Patient unable to participate in goal setting Time For Goal Achievement: 08/05/21 Potential to Achieve Goals: Fair Progress towards PT goals: Progressing toward goals    Frequency    Min 2X/week      PT Plan Current plan remains appropriate    Co-evaluation PT/OT/SLP Co-Evaluation/Treatment: Yes Reason for Co-Treatment: Complexity of the patient's impairments (multi-system involvement);Necessary to address cognition/behavior during functional activity;For patient/therapist safety;To address functional/ADL transfers PT goals addressed during session: Mobility/safety with mobility;Balance;Strengthening/ROM        AM-PAC PT "6 Clicks" Mobility   Outcome Measure  Help needed turning from your back to your side while in a flat bed without using bedrails?: Total Help needed moving from lying on your back to sitting on the side of a flat bed without using bedrails?: A Lot Help needed moving to and from a bed to a chair (including a wheelchair)?: Total  Help needed standing up from a chair using your arms (e.g., wheelchair or bedside chair)?: Total Help needed to walk in hospital room?: Total Help needed climbing 3-5 steps with a railing? : Total 6 Click Score: 7     End of Session Equipment Utilized During Treatment: Other (comment) (transfer pad and B HHA for safety) Activity Tolerance: Patient tolerated treatment well Patient left: in bed;with call bell/phone within reach;with bed alarm set;with nursing/sitter in room;with SCD's reapplied (heels floated, HOB elevated ~40 degrees and bed in chair position) Nurse Communication: Mobility status;Other (comment) (pt itching, skin breakdown on back, esp L lower back) PT Visit Diagnosis: Other abnormalities of gait and mobility (R26.89);Muscle weakness (generalized) (M62.81);Pain;History of falling (Z91.81);Adult, failure to thrive (R62.7) Pain - part of body:  (knees/elbows)     Time: 2426-8341 PT Time Calculation (min) (ACUTE ONLY): 38 min  Charges:  $Neuromuscular Re-education: 8-22 mins                     Tiernan Suto P., PTA Acute Rehabilitation Services Pager: [phone] Office: [phone]    [place]us [place] 07/25/2021, 4:55 PM

## 2021-07-25 NOTE — Progress Notes (Signed)
Neurosurgery Service Progress Note  Subjective: No complaints this morning   Objective: Vitals:   07/24/21 2022 07/25/21 0003 07/25/21 0352 07/25/21 0800  BP: (!) 149/85 (!) 148/72 (!) 146/83 (!) 157/85  Pulse: (!) 104 (!) 101 (!) 110 (!) 102  Resp: 19 18 20 20   Temp: 98.3 F (36.8 C) 98.6 F (37 C) 99.2 F (37.3 C) 97.7 F (36.5 C)  TempSrc: Oral Oral Oral Axillary  SpO2: 99% 100% 100% 99%    Physical Exam: Aox2 (recently re-oriented by nursing, says she thought she was at home but now knows she in the hospital, year answered 2020), eyes closed and asked me not to open them - can't evaluate pupils / EOMs, face grossly symmetric, TM, Strength diffusely 4/5 x4, SILTx4, no hoffman's, no clonus  Assessment & Plan: 75 y.o. woman s/p ACDF for cervical epidural abscess, p/w AMS, workup has shown progression of her cervical infection, new discitis at T12-1, R SI septic arthritis. MRI w/ central pontine T2 changes  -reviewed ID's notes, for her MRI brain, I agree that this is likely subacute infarct, could potentially be CPM, would not be c/w septic embolic source or localized abscess / etc, do not think she has a brain abscess -her exam does not show any clear evidence of myelopathy / symptoms from cord compression, no change from my standpoint, going to have to see if the antibiotics will work  61  07/25/21 9:03 AM

## 2021-07-25 NOTE — Plan of Care (Signed)

## 2021-07-26 LAB — GLUCOSE, CAPILLARY
Glucose-Capillary: 59 mg/dL — ABNORMAL LOW (ref 70–99)
Glucose-Capillary: 82 mg/dL (ref 70–99)
Glucose-Capillary: 105 mg/dL — ABNORMAL HIGH (ref 70–99)

## 2021-07-26 LAB — BASIC METABOLIC PANEL
Anion gap: 9 (ref 5–15)
BUN: 23 mg/dL (ref 8–23)
CO2: 22 mmol/L (ref 22–32)
Calcium: 8.6 mg/dL — ABNORMAL LOW (ref 8.9–10.3)
Chloride: 110 mmol/L (ref 98–111)
Creatinine, Ser: 0.72 mg/dL (ref 0.44–1.00)
GFR, Estimated: >60 mL/min (ref >60)
Glucose, Bld: 113 mg/dL — ABNORMAL HIGH (ref 70–99)
Potassium: 3.1 mmol/L — ABNORMAL LOW (ref 3.5–5.1)
Sodium: 141 mmol/L (ref 135–145)

## 2021-07-26 LAB — CBC
HCT: 25.7 % — ABNORMAL LOW (ref 36.0–46.0)
Hemoglobin: 8.3 g/dL — ABNORMAL LOW (ref 12.0–15.0)
MCH: 31.7 pg (ref 26.0–34.0)
MCHC: 32.3 g/dL (ref 30.0–36.0)
MCV: 98.1 fL (ref 80.0–100.0)
Platelets: 281 10*3/uL (ref 150–400)
RBC: 2.62 MIL/uL — ABNORMAL LOW (ref 3.87–5.11)
RDW: 17.3 % — ABNORMAL HIGH (ref 11.5–15.5)
WBC: 12.8 10*3/uL — ABNORMAL HIGH (ref 4.0–10.5)
nRBC: 0 % (ref 0.0–0.2)

## 2021-07-26 MED ORDER — POTASSIUM CHLORIDE 20 MEQ PO PACK: 40.0000 meq | PACK | Freq: Once | ORAL | Status: DC

## 2021-07-26 MED ORDER — POTASSIUM CHLORIDE 20 MEQ PO PACK
20.0000 meq | PACK | Freq: Every day | ORAL | Status: DC
Start: 2021-07-26 — End: 2021-08-06
  Administered 2021-07-26 – 2021-08-06 (×12): 20 meq
  Filled 2021-07-26 (×12): qty 1

## 2021-07-26 MED ORDER — POTASSIUM CHLORIDE 20 MEQ PO PACK
40.0000 meq | PACK | Freq: Once | ORAL | Status: AC
  Administered 2021-07-26: 40 meq
  Filled 2021-07-26: qty 2

## 2021-07-26 MED ORDER — JEVITY 1.5 CAL/FIBER PO LIQD
1000.0000 mL | ORAL | Status: DC
  Administered 2021-07-26 – 2021-08-05 (×12): 1000 mL
  Filled 2021-07-26 (×16): qty 1000

## 2021-07-26 MED ORDER — POTASSIUM CHLORIDE 20 MEQ PO PACK: 20.0000 meq | PACK | Freq: Every day | ORAL | Status: DC

## 2021-07-26 MED ORDER — MIDODRINE HCL 5 MG PO TABS
2.5000 mg | ORAL_TABLET | Freq: Three times a day (TID) | ORAL | Status: DC
  Administered 2021-07-26 – 2021-07-27 (×4): 2.5 mg
  Filled 2021-07-26 (×4): qty 1

## 2021-07-26 NOTE — Progress Notes (Signed)
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  She was brought from the nursing facility due to shortness of breath, more confusion than usual baseline . In the ED: CT head showed acute/subacute possible pontine infarct, CT abdomen pelvis showed dystrophic changes inferior endplate of T12, L1 vertebral body, narrowing of the intervening intervertebral disc and a small amount of soft tissue thickening anteriorly, new finding compared to previous CTs and may represent sequelae of discitis/osteomyelitis, MRI lumbar C-spine was ordered and lab work showed leukocytosis 12.5, CT chest small amount of airspace consolidation versus atelectasis in the bilateral lung bases. ED called [facility] per daughter request but no bed, patient was admitted MRI brain positive for 1.5 cm? ?? acute to subacute ischemic nonhemorrhagic central pontine infarct. MRI C-spine and L spine done: Referto the report for more detail but shows persistent/recurrent osteomyelitis discitis at multiple levels, septic arthritis of SI joints with small soft tissue abscess. 9/11 Neurosurgery and ID was consulted-neurosurgery did not advise acute surgical intervention. Seen by speech and placed on  pured diet 9/13:Repeat MRI brain w/ contrast:no intracranial focus of abnormal contrast. 9/14 ID recommended to continue with cefazolin through 08/25/2021, outpatient ID follow-up.  Neurology and neurosurgery has signed off. Patient is being managed with PT OT and speech follow-up  Per daughter at home she was laying still on the bed,painful movements and was hurting and did not clearly say where the pain is coming from.After fall brought to the ED. Per daughter she was walking before she fell  Subjective:  Patient having frequent loose stool- yesterday and needed rectal tube yesterday given that she was dealing with a lot of skin issues, soiling. This morning she is alert awake somewhat agitated. Low blood sugar after holding tube feeds so resumed.  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 In ED-Underwent MRI C and L-spine report details and seen by Dr. Maurice Small from neurosurgery in ED and again on 9/15-do not advise any surgery at this time. Blood cultures no growth.Seen by ID recommended to continue with cefazolin through 08/25/2021, outpatient ID follow-up.   Acute Metabolic encephalopathy: Appears to have subacute encephalopathy, mental state has cleared some on discharge on 9/1 and w/ intermittent confusion.  Here following simple commands. Reportedly more confused at the facility and brought to the ED.Likely multifactorial  in the setting of  her persistent spine infection and deconditioning.  Given history of alcohol use being treated with high-dose thiamine following we will continue on daily thiamine.  Seen by neurology underwent MRI brain repeat with contrast no acute finding.SLP eval done scored 17 out of 25 on MMSE 9/13.  Continue on PTOT, CLP, RD augment diet, suppostive care  Subacute pontine infarct: in ED abnormal non contrast MRI 1.5 cm 2/2 subacute ischemic likely affecting multiple penetrating arteries due to small vessel disease.MRI brain with contrast 9/12-no intracranial focus of abnormal contrast-enhancement.  Discussed with neurologist. Completed stroke w/u with up with A1c 5.1 , lipid panel  ldl 88, goal < 70,CT angio head and neck no LVO. Echo with EF 60 to 65%, no R WMA, normal mitral valve, aortic valve with mild AR.Plan to contine  ASA 81 mg, Lipitor 40 mg.  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 Recent Labs  Lab 07/22/21 0324 07/23/21 0352 07/24/21 0415 07/25/21 0318 07/26/21 0630  BUN 21 27* 29* 29* 23  CREATININE 0.80 1.01* 1.13* 0.84 0.72    Diarrhea/loose stool: ?etiology- on tube feeds-RD to evaluate to see if related to tube feed.  We can try holding the tube feed to see if diarrhea resolves. Will keep on ivf while not on tube feeds. No significant abdomen tenderness fever chills on exam.  Mild leukocytosis fluctuating.  Needed rectal tube to prevent soiling given patient's raw skin-we will try to keep it for minium shortest duration to prevent any complication from rectal tube and discontinue as soon as possible. cont on supportive care, as needed Imodium.  Hypokalemia repleting with  potassium through PEG tube Recent Labs  Lab 07/22/21 0324 07/23/21 0352 07/24/21 0415 07/25/21 0318 07/26/21 0630  K 3.9 3.7 3.6 3.5 3.1*    No CAUTI Recurrent urine retention needing Foley catheter  sincne last admission Urien culture :30,000 COLONIES/mL KLEBSIELLA ORNITHINOLYTICA ,40,000 COLONIES/mL YEAST-??colonization.UA  wbc 21-50, LE moderate, nitrite + suspect colonization.  Antibiotics as above per ID.  Discussed with ID.  Bilateral lung base consolidation versus atelectasis,  less likely pneumonia,encourage incentive spirometry.  FEN/PEG-tube in place continue tube feeding.  Speech has seen the patient and started on dysphagia 1/pured diet, augment nutrition and encourage oral intake.   Moderate Protein Calorie malnutrition: low albumin-continue to augment nutritional status has very low albumin level.   Hypoglycemia-stable after being back on TF, monitor.  We will try D5 normal saline  while holding the feeding Recent Labs  Lab 07/25/21 1621 07/25/21 1946 07/25/21 2337 07/26/21 0408 07/26/21 0524  GLUCAP 65* 99 82 59* 105*     Mildly elevated bilirubin:Monitor  Chronic dermatitis/chronic itching:Continue Atarax, prn Benadryl, skin cream  she has had this for few years. Rifampin stopped in case it was making the rash worse.  Hypertension stable.  She has been on midodrine PTA, blood pressure in 140s to 150s also mild tachycardia-decrease to 2.5 mg 3 times daily  Chronic anemia Epistaxis on last admission where ENT  Dr Ezzard Standing - cauterized bleeding vessels in the right anterior nostril and aspirin/prophylactic heparin was stopped at that time and patient also had thrombocytopenia and anemia hemoglobin w/ hb up to 6.7 gmin the setting of epistaxis needing 2 unit transfusion.  Overall stable here. Recent Labs  Lab 07/22/21 0324 07/23/21 0352 07/24/21 0415 07/25/21 2878  07/26/21 0630  HGB 8.7* 8.0* 8.5* 7.9* 8.3*  HCT 28.4* 25.1* 26.4* 25.1* 25.7*     Goals of  care: Currently full code.Given  her infectious process, encephalopathy, co-morbidities palliative care consult appreciated on board.  Daughter has been stating " She was independent pror to last admission and only health issues was : heavy alcohol use" and now severely debilitated, daughter and family wants to pursue full aggressive care.  Attempted transfer EDP called [facility] but no beds so admitted to [facility].  Per daughter request called [facility] transfer line 9/12 evening and was  called by by Dr Collie Siad and NOT accepted for transfer. Diet Order             DIET - DYS 1 Room service appropriate? Yes; Fluid consistency: Thin  Diet effective now                   Patient's There is no height or weight on file to calculate BMI. DVT prophylaxis: enoxaparin (LOVENOX) injection 40 mg Start: 07/23/21 1545 SCDs Start: 07/21/21 0106. Lovenox added given her lack of mobility/high risk of VTE ,daughter is very concerned too. Code Status:   Code Status: Full Code  Family Communication: plan of care discussed with patient and family at bedside 9/12.  Called daughter and updated  9/13-in agreement with our current plan of care. Discussed with the daughter in detail . Plan for discharge to skilled nursing facility once stable.  S/P PICC line 9/15.    Status is: Inpatient Remains inpatient appropriate because:Ongoing diagnostic testing needed not appropriate for outpatient work up, IV treatments appropriate due to intensity of illness or inability to take PO, and Inpatient level of care appropriate due to severity of illness Dispo: The patient is from: SNF              Anticipated d/c is to:SNF hopefully in day or two,TOC consulted              Patient currently is not medically stable to d/c.   Difficult to place patient No  Unresulted Labs (From admission, onward)     Start     Ordered   07/30/21 0500  Creatinine, serum  (enoxaparin (LOVENOX)    CrCl >/= 30 ml/min)  Weekly,   R     Comments:  while on enoxaparin therapy   Question:  Specimen collection method  Answer:  Lab=Lab collect   07/23/21 1456   07/25/21 0500  Basic metabolic panel  Daily,   R     Question:  Specimen collection method  Answer:  Lab=Lab collect   07/24/21 0747   07/25/21 0500  CBC  Daily,   R     Question:  Specimen collection method  Answer:  Lab=Lab collect   07/24/21 0747          Medications reviewed: Scheduled Meds:   stroke: mapping our early stages of recovery book   Does not apply Once   acetaminophen  650 mg Per Tube Q6H   allopurinol  100 mg Per Tube Daily   aspirin  81 mg Oral Daily   atorvastatin  40 mg Oral QHS   Chlorhexidine Gluconate Cloth  6 each Topical Daily   diclofenac Sodium  4 g Topical TID AC & HS   enoxaparin (LOVENOX) injection  40 mg Subcutaneous Q24H   feeding supplement (PROSource TF)  45 mL Per Tube TID   free water  125 mL Per Tube Q4H   lubriderm  seriously sensitive   Topical BID   midodrine  2.5 mg Per Tube TID WC   OLANZapine  2.5 mg Oral QHS   sodium chloride flush  10-40 mL Intracatheter Q12H   thiamine injection  100 mg Intravenous Q8H   triamcinolone 0.1 % cream : eucerin   Topical BID   Continuous Infusions:   ceFAZolin (ANCEF) IV 2 g (07/26/21 0621)   feeding supplement (OSMOLITE 1.5 CAL) 55 mL/hr at 07/25/21 1632   lactated ringers 50 mL/hr at 07/25/21 1712   Consultants:see note  Procedures:see note Antimicrobials: Anti-infectives (From admission, onward)    Start     Dose/Rate Route Frequency Ordered Stop   07/24/21 1400  ceFAZolin (ANCEF) IVPB 2g/100 mL premix        2 g 200 mL/hr over 30 Minutes Intravenous Every 8 hours 07/24/21 1150 08/25/21 1359   07/22/21 2200  nafcillin 2 g in sodium chloride 0.9 % 100 mL IVPB  Status:  Discontinued        2 g 200 mL/hr over 30 Minutes Intravenous Every 4 hours 07/22/21 1320 07/24/21 1150   07/22/21 1215  vancomycin (VANCOREADY) IVPB 1250 mg/250 mL  Status:  Discontinued        1,250 mg 166.7  mL/hr over 90 Minutes Intravenous Every 24 hours 07/22/21 1120 07/22/21 1320   07/21/21 1700  rifampin (RIFADIN) 300 mg in sodium chloride 0.9 % 100 mL IVPB  Status:  Discontinued        300 mg 200 mL/hr over 30 Minutes Intravenous Every 12 hours 07/21/21 1327 07/22/21 1014   07/21/21 1600  nafcillin 2 g in sodium chloride 0.9 % 100 mL IVPB  Status:  Discontinued        2 g 200 mL/hr over 30 Minutes Intravenous Every 4 hours 07/21/21 1455 07/22/21 1014   07/21/21 1330  nafcillin injection 2 g  Status:  Discontinued        2 g Intravenous Every 4 hours 07/21/21 1326 07/21/21 1455   07/21/21 0000  vancomycin (VANCOCIN) IVPB 1000 mg/200 mL premix  Status:  Discontinued        1,000 mg 200 mL/hr over 60 Minutes Intravenous Every 12 hours 07/20/21 2156 07/21/21 1326   07/20/21 2200  ceFEPIme (MAXIPIME) 2 g in sodium chloride 0.9 % 100 mL IVPB  Status:  Discontinued        2 g 200 mL/hr over 30 Minutes Intravenous Every 8 hours 07/20/21 2143 07/21/21 1326   07/20/21 2145  vancomycin (VANCOREADY) IVPB 1750 mg/350 mL        1,750 mg 175 mL/hr over 120 Minutes Intravenous  Once 07/20/21 2143 07/21/21 0308      Culture/Microbiology    Component Value Date/Time   SDES URINE, CATHETERIZED 07/22/2021 1737   SPECREQUEST  07/22/2021 1737    NONE Performed at [facility], [address], [place], [place] [address]    CULT (A) 07/22/2021 1737    30,000 COLONIES/mL KLEBSIELLA ORNITHINOLYTICA 40,000 COLONIES/mL YEAST    REPTSTATUS 07/25/2021 FINAL 07/22/2021 1737    Other culture-see note  Objective: Vitals: Today's Vitals   07/25/21 1948 07/25/21 2335 07/26/21 0420 07/26/21 0750  BP: (!) 158/85 139/83 (!) 167/71 (!) 152/81  Pulse: 97 (!) 101 (!) 102 (!) 104  Resp: 17 17 20 18   Temp: 98.3 F (36.8 C) 98.3 F (36.8 C) 98.6 F (37 C) 97.8 F (36.6 C)  TempSrc:    Oral  SpO2: 99% 100% 100% 100%  PainSc: 0-No  pain 0-No pain 0-No pain     Intake/Output Summary (Last 24 hours) at  07/26/2021 1043 Last data filed at 07/26/2021 0651 Gross per 24 hour  Intake 3999.42 ml  Output 3300 ml  Net 699.42 ml    There were no vitals filed for this visit. Weight change:   Intake/Output from previous day: 09/15 0701 - 09/16 0700 In: 3999.4 [I.V.:2109.3; IV Piggyback:1890.1] Out: 3300 [Urine:3300] Intake/Output this shift: No intake/output data recorded. There were no vitals filed for this visit.  Examination: General exam: AAOxself, people,older than stated age, weak appearing. HEENT:Oral mucosa moist, Ear/Nose WNL grossly, dentition normal. Respiratory system: bilaterally clear breath sounds, no use of accessory muscle Cardiovascular system: S1 & S2 +, No JVD,. Gastrointestinal system: Abdomen soft,PEG+  NT,ND, BS+ Nervous System:Alert, awake, moving extremities and grossly nonfocal Extremities: NO edema, distal peripheral pulses palpable.  Skin: Dry skin , raw areas w/ scratch mark in trunks MSK: Normal muscle bulk,tone, power     Data Reviewed: I have personally reviewed following labs and imaging studies CBC: Recent Labs  Lab 07/20/21 1754 07/20/21 1820 07/22/21 0324 07/23/21 0352 07/24/21 0415 07/25/21 0318 07/26/21 0630  WBC 12.5*   < > 12.3* 8.7 8.8 10.4 12.8*  NEUTROABS 8.8*  --   --   --   --   --   --   HGB 8.0*   < > 8.7* 8.0* 8.5* 7.9* 8.3*  HCT 26.0*   < > 28.4* 25.1* 26.4* 25.1* 25.7*  MCV 100.4*   < > 100.4* 98.8 98.1 99.2 98.1  PLT 212   < > 217 223 239 255 281   < > = values in this interval not displayed.    Basic Metabolic Panel: Recent Labs  Lab 07/22/21 0324 07/23/21 0352 07/24/21 0415 07/25/21 0318 07/26/21 0630  NA 136 136 138 140 141  K 3.9 3.7 3.6 3.5 3.1*  CL 106 105 104 110 110  CO2 20* 20* 19* 20* 22  GLUCOSE 71 101* 86 97 113*  BUN 21 27* 29* 29* 23  CREATININE 0.80 1.01* 1.13* 0.84 0.72  CALCIUM 8.7* 8.1* 8.4* 8.4* 8.6*    GFR: Estimated Creatinine Clearance: 66.4 mL/min (by C-G formula based on SCr of 0.72  mg/dL). Liver Function Tests: Recent Labs  Lab 07/20/21 1754 07/22/21 0324 07/23/21 0352 07/24/21 0415  AST 17 22 16 16   ALT 5 7 5 5   ALKPHOS 141* 112 103 128*  BILITOT 0.8 1.9* 1.7* 1.9*  PROT 7.4 7.0 6.3* 7.0  ALBUMIN 1.6* 1.6* 1.4* 1.6*    No results for input(s): LIPASE, AMYLASE in the last 168 hours. No results for input(s): AMMONIA in the last 168 hours. Coagulation Profile: No results for input(s): INR, PROTIME in the last 168 hours. Cardiac Enzymes: No results for input(s): CKTOTAL, CKMB, CKMBINDEX, TROPONINI in the last 168 hours. BNP (last 3 results) No results for input(s): PROBNP in the last 8760 hours. HbA1C: No results for input(s): HGBA1C in the last 72 hours.  CBG: Recent Labs  Lab 07/25/21 1621 07/25/21 1946 07/25/21 2337 07/26/21 0408 07/26/21 0524  GLUCAP 65* 99 82 59* 105*    Lipid Profile: No results for input(s): CHOL, HDL, LDLCALC, TRIG, CHOLHDL, LDLDIRECT in the last 72 hours.  Thyroid Function Tests: No results for input(s): TSH, T4TOTAL, FREET4, T3FREE, THYROIDAB in the last 72 hours. Anemia Panel: No results for input(s): VITAMINB12, FOLATE, FERRITIN, TIBC, IRON, RETICCTPCT in the last 72 hours. Sepsis Labs: Recent Labs  Lab 07/20/21 1754  LATICACIDVEN 1.3     Recent Results (from the past 240 hour(s))  Resp Panel by RT-PCR (Flu A&B, Covid) Nasopharyngeal Swab     Status: None   Collection Time: 07/20/21  5:20 PM   Specimen: Nasopharyngeal Swab; Nasopharyngeal(NP) swabs in vial transport medium  Result Value Ref Range Status   SARS Coronavirus 2 by RT PCR NEGATIVE NEGATIVE Final    Comment: (NOTE) SARS-CoV-2 target nucleic acids are NOT DETECTED.  The SARS-CoV-2 RNA is generally detectable in upper respiratory specimens during the acute phase of infection. The lowest concentration of SARS-CoV-2 viral copies this assay can detect is 138 copies/mL. A negative result does not preclude SARS-Cov-2 infection and should not be used  as the sole basis for treatment or other patient management decisions. A negative result may occur with  improper specimen collection/handling, submission of specimen other than nasopharyngeal swab, presence of viral mutation(s) within the areas targeted by this assay, and inadequate number of viral copies(<138 copies/mL). A negative result must be combined with clinical observations, patient history, and epidemiological information. The expected result is Negative.  Fact Sheet for Patients:  BloggerCourse.com  Fact Sheet for Healthcare Providers:  SeriousBroker.it  This test is no t yet approved or cleared by the [place] FDA and  has been authorized for detection and/or diagnosis of SARS-CoV-2 by FDA under an Emergency Use Authorization (EUA). This EUA will remain  in effect (meaning this test can be used) for the duration of the COVID-19 declaration under Section 564(b)(1) of the Act, 21 U.S.C.section 360bbb-3(b)(1), unless the authorization is terminated  or revoked sooner.       Influenza A by PCR NEGATIVE NEGATIVE Final   Influenza B by PCR NEGATIVE NEGATIVE Final    Comment: (NOTE) The Xpert Xpress SARS-CoV-2/FLU/RSV plus assay is intended as an aid in the diagnosis of influenza from Nasopharyngeal swab specimens and should not be used as a sole basis for treatment. Nasal washings and aspirates are unacceptable for Xpert Xpress SARS-CoV-2/FLU/RSV testing.  Fact Sheet for Patients: BloggerCourse.com  Fact Sheet for Healthcare Providers: SeriousBroker.it  This test is not yet approved or cleared by the [place] FDA and has been authorized for detection and/or diagnosis of SARS-CoV-2 by FDA under an Emergency Use Authorization (EUA). This EUA will remain in effect (meaning this test can be used) for the duration of the COVID-19 declaration under Section 564(b)(1)  of the Act, 21 U.S.C. section 360bbb-3(b)(1), unless the authorization is terminated or revoked.  Performed at [facility], [address], [place], [place] [address]   Culture, blood (routine x 2)     Status: None   Collection Time: 07/20/21  6:06 PM   Specimen: BLOOD  Result Value Ref Range Status   Specimen Description BLOOD SITE NOT SPECIFIED  Final   Special Requests   Final    BOTTLES DRAWN AEROBIC AND ANAEROBIC Blood Culture adequate volume   Culture   Final    NO GROWTH 5 DAYS Performed at [facility], [address] [place], [place] [address]    Report Status 07/25/2021 FINAL  Final  Culture, blood (routine x 2)     Status: None   Collection Time: 07/20/21  6:09 PM   Specimen: BLOOD  Result Value Ref Range Status   Specimen Description BLOOD SITE NOT SPECIFIED  Final   Special Requests   Final    BOTTLES DRAWN AEROBIC ONLY Blood Culture adequate volume   Culture   Final  NO GROWTH 5 DAYS Performed at [facility], [address] [place], [place] [address]    Report Status 07/25/2021 FINAL  Final  MRSA Next Gen by PCR, Nasal     Status: None   Collection Time: 07/20/21  9:44 PM   Specimen: Nasal Mucosa; Nasal Swab  Result Value Ref Range Status   MRSA by PCR Next Gen NOT DETECTED NOT DETECTED Final    Comment: (NOTE) The GeneXpert MRSA Assay (FDA approved for NASAL specimens only), is one component of a comprehensive MRSA colonization surveillance program. It is not intended to diagnose MRSA infection nor to guide or monitor treatment for MRSA infections. Test performance is not FDA approved in patients less than 71 years old. Performed at [facility], [address], [place], [place] [address]   Urine Culture     Status: Abnormal   Collection Time: 07/22/21  5:37 PM   Specimen: Urine, Catheterized  Result Value Ref Range Status   Specimen Description URINE, CATHETERIZED  Final   Special Requests   Final    NONE Performed at  [facility], [address] [place], [place] [address]    Culture (A)  Final    30,000 COLONIES/mL KLEBSIELLA ORNITHINOLYTICA 40,000 COLONIES/mL YEAST    Report Status 07/25/2021 FINAL  Final   Organism ID, Bacteria KLEBSIELLA ORNITHINOLYTICA (A)  Final      Susceptibility   Klebsiella ornithinolytica - MIC*    AMPICILLIN >=32 RESISTANT Resistant     CEFAZOLIN >=64 RESISTANT Resistant     CEFEPIME >=32 RESISTANT Resistant     CEFTRIAXONE >=64 RESISTANT Resistant     CIPROFLOXACIN 1 SENSITIVE Sensitive     GENTAMICIN >=16 RESISTANT Resistant     IMIPENEM <=0.25 SENSITIVE Sensitive     NITROFURANTOIN 64 INTERMEDIATE Intermediate     TRIMETH/SULFA >=320 RESISTANT Resistant     AMPICILLIN/SULBACTAM >=32 RESISTANT Resistant     PIP/TAZO 32 INTERMEDIATE Intermediate     * 30,000 COLONIES/mL KLEBSIELLA ORNITHINOLYTICA      Radiology Studies: [place] EKG SITE RITE  Result Date: 07/25/2021 If Site Rite image not attached, placement could not be confirmed due to current cardiac rhythm.    LOS: 6 days   Lanae Boast, MD [facility]  07/26/2021, 10:43 AM

## 2021-07-26 NOTE — Progress Notes (Signed)
Occupational Therapy Treatment Patient Details Name: Kristy [name] MRN: [id number] DOB: [birth year] Today's Date: 07/26/2021   History of present illness 75 y.o. woman who presented to the ED from SNF 9/11 due to increased confusion and SOB. MRI (+) pontine infarct. Concern for Pneumonia and probably CAUTI. 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 PMH: HTN, polycthemia, alcohol use.   OT comments  Pt with increased confusion this pm as compared to yesterday, however able to follow commands and participate with OT. Pt repositioned and moved into chair position. Pt with significantly improved tolerance for upright seated position. Bed repositioned to increase awareness of environment and social interaction with staff. BUE AROM is limited, especially proximally, but continues to improve. With R elbow supported, able to bring washcloth to face to wipe eyes. Will continue to follow acutely.    Recommendations for follow up therapy are one component of a multi-disciplinary discharge planning process, led by the attending physician.  Recommendations may be updated based on patient status, additional functional criteria and insurance authorization.    Follow Up Recommendations       Equipment Recommendations       Recommendations for Other Services      Precautions / Restrictions Precautions Precautions: Fall;Cervical;Other (comment)       Mobility Bed Mobility               General bed mobility  comments: Pt sliding down in bed with legs toward R side of bed. Pt repositioned and moved into chair position @ bed level. BUE supported with pillows. With support of pillow, pt able to reach face with washcloth. Bed oriented toward TV and door to increase awareness of surroundings, as pt faced toward her windows/brick wall. Once positioned toward door/TV, pt able to attend to tasks adn contineud to be interactive with therapist. Did not transfer to chair due to concner of pt falling from chair given poor trunk control adn level of confusion. Pt staes she is going home to shower.    Transfers                      Balance     Sitting balance-Leahy Scale: Poor                                     ADL either performed or assessed with clinical judgement   ADL Overall ADL's : Needs assistance/impaired     Grooming: Maximal assistance Grooming Details (indicate cue type and reason): with arm supported, pt able to bring cloth to face and wipe out eyes, wash face                                     Vision       Perception     Praxis      Cognition Arousal/Alertness: Awake/alert Behavior During Therapy: Restless;Impulsive Overall Cognitive Status: Impaired/Different from baseline  General Comments: Pt talking to herself and pleasantly delirius,however able to participate with OT adn follow commands to help participate in ADL tasks and exercise.        Exercises General Exercises - Upper Extremity Shoulder Flexion: AROM;AAROM;Both;10 reps;Seated Shoulder Extension: AAROM;AROM;Both;10 reps;Seated Shoulder ABduction: AROM;AAROM;Both;10 reps;Seated Elbow Flexion: AROM;AAROM;Both;20 reps;Seated Elbow Extension: AROM;AAROM;Both;20 reps;Seated Digit Composite Flexion: Strengthening;AROM;AAROM;20 reps;Both;Seated   Shoulder Instructions       General Comments When boosting up in bed, pt  spontaneously flexed both knees to help with mobility    Pertinent Vitals/ Pain       Pain Assessment: Faces Faces Pain Scale: Hurts little more Pain Location: neck; BUE with movement - elbows/shoulder Pain Descriptors / Indicators: Discomfort;Grimacing Pain Intervention(s): Limited activity within patient's tolerance;Repositioned  Home Living                                          Prior Functioning/Environment              Frequency           Progress Toward Goals  OT Goals(current goals can now be found in the care plan section)  Progress towards OT goals: Progressing toward goals  Acute Rehab OT Goals Patient Stated Goal: To get walking and go home OT Goal Formulation: With patient/family Time For Goal Achievement: 08/05/21 Potential to Achieve Goals: Fair ADL Goals Pt Will Perform Eating: with mod assist;with adaptive utensils;sitting Pt Will Perform Grooming: with max assist;bed level Pt Will Perform Upper Body Bathing: with max assist;bed level Pt Will Transfer to Toilet: with +2 assist;with mod assist;bedside commode;ambulating Pt/caregiver will Perform Home Exercise Program: Increased ROM;Both right and left upper extremity;With written HEP provided;Independently Additional ADL Goal #1: Pt will tolerate HOB @ 60 x 20 min to increase tolerance for upright sitting Additional ADL Goal #2: Pt will complete hand to mouth pattern with Max A to increase independence with grooming Additional ADL Goal #3: Pt will complete hand to mouth pattern with BUE and Mod A in prep for self-feeding.  Plan Discharge plan remains appropriate;Frequency remains appropriate    Co-evaluation                 AM-PAC OT "6 Clicks" Daily Activity     Outcome Measure                    End of Session    OT Visit Diagnosis: Unsteadiness on feet (R26.81);Other abnormalities of gait and mobility (R26.89);Muscle weakness (generalized) (M62.81);History of  falling (Z91.81);Other symptoms and signs involving cognitive function;Pain Pain - part of body: Shoulder;Arm   Activity Tolerance Patient tolerated treatment well   Patient Left in bed;with call bell/phone within reach;with chair alarm set;Other (comment) (chair position)   Nurse Communication Mobility status;Other (comment) (need to place into chair position at least TID)        Time: 4854-6270 OT Time Calculation (min): 27 min  Charges: OT General Charges $OT Visit: 1 Visit OT Treatments $Self Care/Home Management : 8-22 mins $Neuromuscular Re-education: 8-22 mins  Luisa Dago, OT/L   Acute OT Clinical Specialist Acute Rehabilitation Services Pager [phone] Office [phone]   [facility] 07/26/2021, 3:57 PM

## 2021-07-26 NOTE — Progress Notes (Signed)
Daily Progress Note   Patient Name: Kristy [name]       Date: 07/26/2021 DOB: [birth year]  Age: 75 y.o. MRN#: [id number] Attending Physician: Lanae Boast, MD Primary Care Physician: Darrow Bussing, MD Admit Date: 07/20/2021  Reason for Consultation/Follow-up: Establishing goals of care  Subjective: Medical records reviewed. Patient assessed at the bedside. She continues to have intermittent confusion. No family was present during my visit.  I then called patient's daughter [name] to provide support and continue ongoing goals of care conversation. She shares that she is pleased with patient's progress (particularly yesterday's OT session) and she continues to hope for further progress towards her previous baseline. She confirms that the goal is for her mother to return to SNF when medically ready. She also continues to voice concern regarding patient's dermatitis. Discussed current management and possibility that rash was exacerbated by rifampin.  Outpatient palliative care services were explained and offered. [name] agrees that this would be beneficial for the patient and accept a referral.  Questions and concerns addressed. Family was encouraged to call with additional needs or concerns. PMT will continue to support holistically.   Length of Stay: 6  Current Medications: Scheduled Meds:    stroke: mapping our early stages of recovery book   Does not apply Once   acetaminophen  650 mg Per Tube Q6H   allopurinol  100 mg Per Tube Daily   aspirin  81 mg Oral Daily   atorvastatin  40 mg Oral QHS   Chlorhexidine Gluconate Cloth  6 each Topical Daily   diclofenac Sodium  4 g Topical TID AC & HS   enoxaparin (LOVENOX) injection  40 mg Subcutaneous Q24H   feeding supplement (PROSource TF)  45  mL Per Tube TID   free water  125 mL Per Tube Q4H   lubriderm seriously sensitive   Topical BID   midodrine  2.5 mg Per Tube TID WC   OLANZapine  2.5 mg Oral QHS   sodium chloride flush  10-40 mL Intracatheter Q12H   thiamine injection  100 mg Intravenous Q8H   triamcinolone 0.1 % cream : eucerin   Topical BID    Continuous Infusions:   ceFAZolin (ANCEF) IV 2 g (07/26/21 0621)   feeding supplement (OSMOLITE 1.5 CAL) 55 mL/hr at 07/25/21 1632   lactated ringers 50  mL/hr at 07/25/21 1712    PRN Meds: acetaminophen **OR** acetaminophen, diphenhydrAMINE, hydrOXYzine, loperamide HCl, ondansetron **OR** ondansetron (ZOFRAN) IV, oxyCODONE, sodium chloride flush, sodium chloride flush  Physical Exam Vitals and nursing note reviewed.  Constitutional:      General: She is not in acute distress.    Appearance: She is ill-appearing.  Cardiovascular:     Rate and Rhythm: Tachycardia present.  Pulmonary:     Effort: Pulmonary effort is normal.  Neurological:     Mental Status: She is alert. She is disoriented.  Psychiatric:        Behavior: Behavior is cooperative.            Vital Signs: BP (!) 152/81 (BP Location: Right Arm)   Pulse (!) 104   Temp 97.8 F (36.6 C) (Oral)   Resp 18   SpO2 100%  SpO2: SpO2: 100 % O2 Device: O2 Device: Room Air O2 Flow Rate:    Intake/output summary:  Intake/Output Summary (Last 24 hours) at 07/26/2021 1134 Last data filed at 07/26/2021 0973 Gross per 24 hour  Intake 3999.42 ml  Output 2600 ml  Net 1399.42 ml    LBM: Last BM Date: 07/26/21 Baseline Weight:   Most recent weight:         Palliative Assessment/Data: 30%     Patient Active Problem List   Diagnosis Date Noted   HCAP (healthcare-associated pneumonia)    Goals of care, counseling/discussion    Vertebral osteomyelitis (HCC)    Epidural abscess    Generalized weakness 07/20/2021   Blood loss anemia 07/12/2021   Abnormal urinalysis 07/11/2021   Acute urinary retention  07/11/2021   MSSA (methicillin susceptible Staphylococcus aureus) infection/UTI-POA 07/11/2021   Cervical discitis 07/11/2021   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) 07/11/2021   Dysphagia, neurologic  07/11/2021   CKD stage G3b/A1, 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 female  with past medical history of polycythemia, CKD 3b, ETOH use, with a recent admission from 8/1 - 9/1.   During that hospitalization she was found to have an epidural abscess involving the cervical spine along with this discitis.  She underwent C5-C6 ACDF on 8/7-cultures were positive for MSSA.  She was subsequently seen by infectious disease-with recommendations to continue IV cefazolin and Ancef until 10/2.  Hospital course was complicated by encephalopathy, dysphagia (PEG tube in place) and recurrent urinary retention (Foley catheter in place).  She was discharged to [facility].   She was admitted to [facility] on 9/10 with SOB and confusion. 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  Spinal imaging showed worsening osteomyelitis discitis due to MSSA at C5-6 C6-7 C7-T1 after ACDF surgery along with epidural abscess from C4-C6 7 with moderate spinal stenosis, and osteomyelitis discitis at T12-L1 with abscess along the right margin of T12-L1 and acute septic arthritis of the SI joint with a soft tissue abscess.   Assessment: Goals of care conversation Osteomyelitis discitis,  cervical Epidural abscess, cervical Acute metabolic encephalopathy, fluctuates Subacute pontine infarct  Recommendations/Plan: Full scope treatment remains consistent with goals of care, patient's daughter is hopeful for improvement Patient's daughter is agreeable to outpatient palliative care referral, TOC assistance is appreciated  Psychosocial and emotional support provided PMT is available to family for additional needs and support, will continue to follow peripherally  Goals of Care and Additional Recommendations: Limitations on Scope of Treatment: Full Scope Treatment  Prognosis:  Guarded  Discharge Planning: Skilled Nursing Facility for rehab with Palliative care service follow-up   Care plan was discussed with Patient's daughter  Total time: 15 minutes Greater than 50% of this time was spent in counseling and coordinating care related to the above assessment and plan.  Richardson Dopp, PA-C Palliative Medicine Team Team phone # [phone]  Thank you for allowing the Palliative Medicine Team to assist in the care of this patient. Please utilize secure chat with additional questions, if there is no response within 30 minutes please call the above phone number.  Palliative Medicine Team providers are available by phone from 7am to 7pm daily and can be reached through the team cell phone.  Should this patient require assistance outside of these hours, please call the patient's attending physician.

## 2021-07-26 NOTE — Progress Notes (Signed)
Nutrition Follow-up  DOCUMENTATION CODES:   Non-severe (moderate) malnutrition in context of chronic illness  INTERVENTION:  Continue TF via PEG: -Transition to Jevity 1.5 @ 55 mL/hr (1320 mL/day) -45 mL ProSource TF TID -Free water per MD, currently 123m Q4H        Provides 2100 kcal, 116 grams protein, 1003 mL free water (1753 mL total free water w/ flushes)   If diarrhea does not improve, recommend initiation of Imodium rather than holding TF given pt's malnutrition and ongoing inadequate oral intake  NUTRITION DIAGNOSIS:   Moderate Malnutrition related to chronic illness as evidenced by mild fat depletion, mild muscle depletion.  ongoing  GOAL:   Patient will meet greater than or equal to 90% of their needs  Met with TF  MONITOR:   PO intake, TF tolerance, Skin  REASON FOR ASSESSMENT:   Consult Enteral/tube feeding initiation and management  ASSESSMENT:   Pt admitted with confusion and shortness of breath; found with acute ischemic stroke and worsening spinal abscess. [facility] No pertinent PMH.  PMT evaluated pt today -- full scope treatment remains consistent with GOC.   Discussed pt with RN who reports pt with loose stool, but otherwise tolerating TF via PEG. Will transition pt to fiber-containing TF formula per MD request; however, medications such as antibiotics and Tylenol are likely largely contributing to pt's loose stools and TF should not be held/discontinued in attempt to resolve diarrhea. If loose stools continue despite transition of TF formula, recommend initiation of Imodium. Discussed with MD.   Pt remains on Dysphagia 1 diet but continues to have no po intake. Recommend TF continue to meet 100% of nutritional needs at this time; however, if po intake begins to improve, consider transition to nocturnal TF.   Current TF: Osmolite 1.5 @ 563mhr, 4553mrosource TF TID, 125m92mee water Q4H  Medications: klor-con,  thiamine, IV abx, tylenol  Q6H Labs: K+ 3.1 (L)  UOP: 3.3L x24 hours I/O: -4L since admit  Diet Order:   Diet Order             DIET - DYS 1 Room service appropriate? Yes; Fluid consistency: Thin  Diet effective now                   EDUCATION NEEDS:   No education needs have been identified at this time  Skin:  Skin Assessment: Skin Integrity Issues: Skin Integrity Issues:: Other (Comment) Other: Dry; rash; flakey; tears on buttocks  Last BM:  9/16 type 6  Height:   Ht Readings from Last 1 Encounters:  07/19/21 '5\' 6"'  (1.676 m)    Weight:   Wt Readings from Last 1 Encounters:  07/19/21 84.1 kg    Ideal Body Weight:  59.1 kg  BMI:  There is no height or weight on file to calculate BMI.  Estimated Nutritional Needs:   Kcal:  2100-2300  Protein:  100-120 gm  Fluid:  > 2.1 L    AmanLarkin Ina, RD, LDN (she/her/hers) RD pager number and weekend/on-call pager number located in [place]

## 2021-07-27 LAB — CBC
HCT: 25.4 % — ABNORMAL LOW (ref 36.0–46.0)
Hemoglobin: 7.9 g/dL — ABNORMAL LOW (ref 12.0–15.0)
MCH: 31 pg (ref 26.0–34.0)
MCHC: 31.1 g/dL (ref 30.0–36.0)
MCV: 99.6 fL (ref 80.0–100.0)
Platelets: 284 10*3/uL (ref 150–400)
RBC: 2.55 MIL/uL — ABNORMAL LOW (ref 3.87–5.11)
RDW: 17.7 % — ABNORMAL HIGH (ref 11.5–15.5)
WBC: 13.4 10*3/uL — ABNORMAL HIGH (ref 4.0–10.5)
nRBC: 0 % (ref 0.0–0.2)

## 2021-07-27 LAB — BASIC METABOLIC PANEL
Anion gap: 10 (ref 5–15)
BUN: 24 mg/dL — ABNORMAL HIGH (ref 8–23)
CO2: 20 mmol/L — ABNORMAL LOW (ref 22–32)
Calcium: 8.7 mg/dL — ABNORMAL LOW (ref 8.9–10.3)
Chloride: 110 mmol/L (ref 98–111)
Creatinine, Ser: 0.8 mg/dL (ref 0.44–1.00)
GFR, Estimated: >60 mL/min (ref >60)
Glucose, Bld: 85 mg/dL (ref 70–99)
Potassium: 3.8 mmol/L (ref 3.5–5.1)
Sodium: 140 mmol/L (ref 135–145)

## 2021-07-27 LAB — GLUCOSE, CAPILLARY
Glucose-Capillary: 103 mg/dL — ABNORMAL HIGH (ref 70–99)
Glucose-Capillary: 105 mg/dL — ABNORMAL HIGH (ref 70–99)
Glucose-Capillary: 76 mg/dL (ref 70–99)
Glucose-Capillary: 80 mg/dL (ref 70–99)
Glucose-Capillary: 82 mg/dL (ref 70–99)
Glucose-Capillary: 87 mg/dL (ref 70–99)
Glucose-Capillary: 95 mg/dL (ref 70–99)

## 2021-07-27 MED ORDER — HYDROXYZINE HCL 25 MG PO TABS
50.0000 mg | ORAL_TABLET | Freq: Four times a day (QID) | ORAL | Status: DC | PRN
  Administered 2021-07-28 – 2021-07-30 (×4): 50 mg
  Filled 2021-07-27 (×6): qty 2

## 2021-07-27 MED ORDER — SACCHAROMYCES BOULARDII 250 MG PO CAPS
250.0000 mg | ORAL_CAPSULE | Freq: Two times a day (BID) | ORAL | Status: DC
  Administered 2021-07-27 – 2021-08-06 (×21): 250 mg
  Filled 2021-07-27 (×21): qty 1

## 2021-07-27 NOTE — TOC Initial Note (Signed)
Transition of Care [facility]) - Initial/Assessment Note    Patient Details  Name: Kristy [name] MRN: [id number] Date of Birth: 06-Jan-1946  [facility] CM/SW Contact:    [name], LCSWA Phone Number: 07/27/2021, 4:13 PM  Clinical Narrative:                 CSW was notified by Nurse that pt's daughter had questions about discharge planning. CSW called Kristy [name], she stated that she did not want her mother going back to [facility] as she does not believe it is safe. She asked if pt could go to [facility] instead. CSW advised her that transfers are typically done at the facility level. She stated that she is not comfortable with her returning at all. CSW noted she would send out referral's to several other facilities. Kristy [name] stated she would like [facility], CSW will advise facility to look at referral.  CSW attempted to advise dtr that we cannot hold pt here once she is medically cleared if she has a bed at [facility], Dtr was very irritated and stated she didn't care what the policy was. Daughter did note that she had a good experience with our OT department. CSW will complete workup and referral. Pt cannot DC over weekend as there are no weekend admissions. TOC will continue to follow. Expected Discharge Plan: Skilled Nursing Facility Barriers to Discharge: Continued Medical Work up, SNF Pending bed offer   Patient Goals and CMS Choice Patient states their goals for this hospitalization and ongoing recovery are:: Pt's daughter stating her goal is to get pt into a different SNF. CMS Medicare.gov Compare Post Acute Care list provided to:: Patient Represenative (must comment) (Daughter, Kristy [name]) Choice offered to / list presented to : Adult Children  Expected Discharge Plan and Services Expected Discharge Plan: Skilled Nursing Facility     Post Acute Care Choice: Skilled Nursing Facility Living arrangements for the past 2 months: Skilled Nursing Facility                                       Prior Living Arrangements/Services Living arrangements for the past 2 months: Skilled Nursing Facility Lives with:: Facility Resident Patient language and need for interpreter reviewed:: Yes Do you feel safe going back to the place where you live?: No   Pt's daughter does not believe [facility] is a safe discharge.  Need for Family Participation in Patient Care: Yes (Comment) Care giver support system in place?: Yes (comment)   Criminal Activity/Legal Involvement Pertinent to Current Situation/Hospitalization: No - Comment as needed  Activities of Daily Living      Permission Sought/Granted Permission sought to share information with : Family Supports Permission granted to share information with : Yes, Verbal Permission Granted  Share Information with NAME: Kristy [name]     Permission granted to share info w Relationship: Daughter  Permission granted to share info w Contact Information: [phone]  Emotional Assessment Appearance:: Appears stated age Attitude/Demeanor/Rapport: Unable to Assess Affect (typically observed): Unable to Assess Orientation: : Oriented to Self Alcohol / Substance Use: Not Applicable Psych Involvement: No (comment)  Admission diagnosis:  Generalized weakness [R53.1] HCAP (healthcare-associated pneumonia) [J18.9] Patient Active Problem List   Diagnosis Date Noted   HCAP (healthcare-associated pneumonia)    Goals of care, counseling/discussion    Vertebral osteomyelitis (HCC)    Epidural abscess    Generalized weakness 07/20/2021  Blood loss anemia 07/12/2021   Abnormal urinalysis 07/11/2021   Acute urinary retention 07/11/2021   MSSA (methicillin susceptible Staphylococcus aureus) infection/UTI-POA 07/11/2021   Cervical discitis 07/11/2021   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) 07/11/2021   Dysphagia, neurologic  07/11/2021   CKD stage G3b/A1, GFR 30-44 and albumin creatinine ratio <30 mg/g  (HCC) 07/11/2021   HTN (hypertension) 07/11/2021   Grade I diastolic dysfunction 07/11/2021   HLD (hyperlipidemia) 07/11/2021   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   Malnutrition of moderate degree 06/18/2021   Metabolic encephalopathy 06/14/2021   Acute delirium 06/14/2021   Sepsis (HCC) 06/14/2021   Fall 06/11/2021   Alcoholic hepatitis 06/11/2021   PCP:  Darrow Bussing, MD Pharmacy:   [id number] [place], [place] - [address] AT [facility] & [address] [place] [address] Phone: [phone] Fax: [phone]     Social Determinants of Health (SDOH) Interventions    Readmission Risk Interventions No flowsheet data found.

## 2021-07-27 NOTE — Progress Notes (Signed)
Rectal tube removed. Pt tolerated well.

## 2021-07-27 NOTE — Progress Notes (Addendum)
PROGRESS NOTE    ALYSHIA KERNAN  ZOX:[id number] DOB: Dec 28, 1945 DOA: 07/20/2021 PCP: Darrow Bussing, MD   Brief Narrative: 75 old female with hypertension, polycythemia, hypertension alcohol use disorder two 12 ounce beer daily, recently admitted and discharged from [facility]  06/10/21-07/11/21 for 2 mechanical falls  and back pain at home-chest x-ray CT head C-spine unremarkable on admission, but had leukocytosis AKI abnormal LFTs urine culture with staph aureus blood culture was positive-and on further work-up found to have cervical epidural abscess C2-T3/4 status post ACDF on 06/16/2021 by  Dr Maurice Small +MSSA and was discharged on IV cefazolin and rifampin through 08/11/2021 and hospital course complicated by acute encephalopathy, dysphagia needing PEG tube, had recurrent urinary retention/UTI-Foley catheter was placed and discharged to SNF.  Readmitted from the nursing facility due to shortness of breath, more confusion than usual baseline .  In the ED:CT head showed acute/subacute possible pontine infarct, CT abdomen pelvis showed dystrophic changes inferior endplate of T12, L1 vertebral body, narrowing of the intervening intervertebral disc and a small amount of soft tissue thickening anteriorly, new finding compared to previous CTs and may represent sequelae of discitis/osteomyelitis, MRI lumbar C-spine was ordered and lab work showed leukocytosis 12.5, CT chest small amount of airspace consolidation versus atelectasis in the bilateral lung bases. ED called [facility] per daughter request but no bed, then patient was admitted MRI brain positive for 1.5 cm? acute to subacute ischemic nonhemorrhagic central pontine infarct. MRI C-spine and L spine done: Referto the report for more detail but shows -vertebral -cervical/lumbar osteomyelitis with epidural abscesses, paraspinal muscle abscesses. 9/11 Neurosurgery, ID, neurology was consulted Neurosurgery did not advise acute surgical intervention/ I&D. Also  seen by speech and placed on pured diet, was n.p.o. plus tube feed previously 9/13:Repeat MRI brain w/ contrast:no intracranial focus of abnormal contrast enhancement. 9/14 ID recommended to continue with cefazolin through 08/25/2021, outpatient ID follow-up, overall treatment failure in the setting of hardware is higher especially in the setting of Staph aureus  Neurology and ID signed off, neurosurgery again saw on  9/15 and signed off  Subjective: Seen this morning nursing reports no diarrhea since changing tube feed, rectal tube being discontinued this morning. Patient is alert awake oriented to self, her date of birth, current hospital but unable to tell me current date, she is tearful this morning talking about her mother who died when she was 75-year-old and also tearful thinking about her brothers.  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  Seen by ID, neurosurgery-no need for acute surgical intervention, recommended to continue through 08/25/2021, outpatient ID follow-up. Blood cultures no growth. Per ID overall treatment failure in the setting of hardware is higher especially in the setting of Staph aureus.  Acute Metabolic encephalopathy: Appears to have subacute encephalopathy, mental state has cleared some on discharge on 9/1 and w/ intermittent confusion.  Able to follow simple commands moving all her extremities alert awake oriented to self date of birth current place but not to date.  Suspecting multifactorial in the setting of her persistent spine infection and deconditioning, history of alcohol abuse, now with subacute pontine infarct.status post  High-dose thiamine.Seen by neurology-had extensive work-up.SLP eval done scored 15/27 on MMSE 9/13( disregard the different score mentioned before,  was error). Continue on PTOT, CLP, RD augment diet, supportive care.Daughter endorses patient is having "double vision"-I have sent message to Dr Thomasena Edis from neuro to evaluate/follow her.   Subacute pontine infarct: in ED abnormal non contrast MRI 1.5 cm 2/2 subacute ischemic likely affecting multiple penetrating arteries due to small vessel disease.MRI brain with contrast 9/12-no intracranial focus of abnormal contrast-enhancement.  Discussed with neurologist. Completed stroke w/u with up with A1c 5.1 , lipid panel  ldl 88, goal < 70,CT angio head and neck no LVO. Echo with EF 60 to 65%, no R WMA, normal mitral valve, aortic valve with mild AR.Plan to contine  ASA 81 mg, Lipitor 40 mg.  Generalized weakness in upper/lower extremities Deconditining Ambulaltory dysfunction Functional quadriplegia: Suspect multifactorial due to some cervical myelopathy/infection pain related, encephalopathy, deconditioning, pain.Continue supportive therapy PT OT speech and dietitian follow-up  CKD stage II- recent AKI-renal function stable. Recent Labs  Lab 07/23/21 0352 07/24/21 0415 07/25/21 0318 07/26/21 0630 07/27/21 0142  BUN 27* 29* 29* 23 24*  CREATININE 1.01* 1.13* 0.84 0.72 0.80    Diarrhea/loose stool: Nursing reports improvement after changing different from Osmolite to JVD.  Discontinue rectal tube today and monitor .  Add probiotics while on antibiotics.  Hypokalemia resolved.   Recurrent urine retention needing Foley catheter  since last admission Urien culture :30,000 COLONIES/mL KLEBSIELLA ORNITHINOLYTICA ,40,000 COLONIES/mL YEAST-??colonization.UA  wbc 21-50, LE moderate, nitrite + suspect colonization.  Antibiotics as above per ID.discussed with ID-No CAUTI.  Bilateral lung base consolidation versus atelectasis,  less likely pneumonia-no  respiratory symptoms.  Suspect due to atelectasis,encourage incentive spirometry.  Moderate Protein Calorie malnutrition: low albumin. TF feeding changed to Jevity due to diarrhea.  Also taking some orally.  Encourage p.o. intake.  Augment diet   Hypoglycemia- resolved. Cont TF Recent Labs  Lab 07/26/21 0408 07/26/21 0524 07/27/21 0104 07/27/21 0327 07/27/21 0623  GLUCAP 59* 105* 105* 82 80     Mildly elevated bilirubin:Monitor.  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 Rifampin stopped in case it was making the rash worse. Saw dermatology before.Will need dermatology follow up, biopsy will not be helpful.   Hypertension:BP stable. She had been on midodrine PTA, blood pressure on higher side/mild tachycardia-decrease from 5 to 2.5 mg 3 times daily 9/16  Chronic anemia Epistaxis on last admission  needing blood transfusion, by ENT.  No recurrence here. Recent Labs  Lab 07/23/21 0352 07/24/21 0415 07/25/21 0318 07/26/21 0630 07/27/21 0142  HGB 8.0* 8.5* 7.9* 8.3* 7.9*  HCT 25.1* 26.4* 25.1* 25.7* 25.4*   Goals of care: Currently full code.Given  her infectious process, encephalopathy, co-morbidities palliative care consulted.patient daughter reports patient was functional prior to last hospital admission requesting full aggressive care.  daughter requested transfer to multiple places- no bed at [place], [place] declined. Prognosis remains guarded and needs to be seen how she does with current antibiotic, supportive care planning. Plan is to send her back to facility if WBC count labs remained stable and no recurrence of diarrhea  Attempted transfer EDP called [facility] but no beds so admitted to [facility]. Per daughter request called [facility] transfer line 9/12 evening and was  called by by Dr Collie Siad and NOT accepted for transfer. Diet Order             DIET - DYS 1 Room service  appropriate? Yes; Fluid consistency: Thin  Diet effective now  Patient's There is no height or weight on file to calculate BMI.  DVT prophylaxis: enoxaparin (LOVENOX) injection 40 mg Start: 07/23/21 1545 SCDs Start: 07/21/21 0106. Lovenox added given her lack of mobility/high risk of VTE ,daughter is very concerned too. Code Status:   Code Status: Full Code  Family Communication: plan of care discussed with husband and daughter along with ID palliative care at bedside 9/12, multiple calls made since then.  Updated daughter again today 07/27/21.  Status WV:PXTGGYIRS Remains inpatient appropriate because:Ongoing diagnostic testing needed not appropriate for outpatient work up, IV treatments appropriate due to intensity of illness or inability to take PO, and Inpatient level of care appropriate due to severity of illness  Dispo:The patient is from: SNF [facility]             Anticipated d/c is to:SNF, daughter prefers to go to [place] and not back to [place] since she was told at [facility] her mother may not be able to walk again.             Patient currently is not medically stable to d/c.Marland Kitchen            Difficult to place patient No  Unresulted Labs (From admission, onward)     Start     Ordered   07/30/21 0500  Creatinine, serum  (enoxaparin (LOVENOX)    CrCl >/= 30 ml/min)  Weekly,   R     Comments: while on enoxaparin therapy   Question:  Specimen collection method  Answer:  Lab=Lab collect   07/23/21 1456          Medications reviewed: Scheduled Meds:   stroke: mapping our early stages of recovery book   Does not apply Once   acetaminophen  650 mg Per Tube Q6H   allopurinol  100 mg Per Tube Daily   aspirin  81 mg Oral Daily   atorvastatin  40 mg Oral QHS   Chlorhexidine Gluconate Cloth  6 each Topical Daily   diclofenac Sodium  4 g Topical TID AC & HS   enoxaparin (LOVENOX) injection  40 mg Subcutaneous Q24H   feeding supplement (PROSource TF)  45 mL Per  Tube TID   free water  125 mL Per Tube Q4H   lubriderm seriously sensitive   Topical BID   midodrine  2.5 mg Per Tube TID WC   OLANZapine  2.5 mg Oral QHS   potassium chloride  20 mEq Per Tube Daily   sodium chloride flush  10-40 mL Intracatheter Q12H   thiamine injection  100 mg Intravenous Q8H   triamcinolone 0.1 % cream : eucerin   Topical BID   Continuous Infusions:   ceFAZolin (ANCEF) IV 2 g (07/27/21 0615)   feeding supplement (JEVITY 1.5 CAL/FIBER) 1,000 mL (07/26/21 1653)   Consultants:see note  Procedures:see note Antimicrobials: Anti-infectives (From admission, onward)    Start     Dose/Rate Route Frequency Ordered Stop   07/24/21 1400  ceFAZolin (ANCEF) IVPB 2g/100 mL premix        2 g 200 mL/hr over 30 Minutes Intravenous Every 8 hours 07/24/21 1150 08/25/21 1359   07/22/21 2200  nafcillin 2 g in sodium chloride 0.9 % 100 mL IVPB  Status:  Discontinued        2 g 200 mL/hr over 30 Minutes Intravenous Every 4 hours 07/22/21 1320 07/24/21 1150   07/22/21 1215  vancomycin (VANCOREADY) IVPB 1250 mg/250 mL  Status:  Discontinued  1,250 mg 166.7 mL/hr over 90 Minutes Intravenous Every 24 hours 07/22/21 1120 07/22/21 1320   07/21/21 1700  rifampin (RIFADIN) 300 mg in sodium chloride 0.9 % 100 mL IVPB  Status:  Discontinued        300 mg 200 mL/hr over 30 Minutes Intravenous Every 12 hours 07/21/21 1327 07/22/21 1014   07/21/21 1600  nafcillin 2 g in sodium chloride 0.9 % 100 mL IVPB  Status:  Discontinued        2 g 200 mL/hr over 30 Minutes Intravenous Every 4 hours 07/21/21 1455 07/22/21 1014   07/21/21 1330  nafcillin injection 2 g  Status:  Discontinued        2 g Intravenous Every 4 hours 07/21/21 1326 07/21/21 1455   07/21/21 0000  vancomycin (VANCOCIN) IVPB 1000 mg/200 mL premix  Status:  Discontinued        1,000 mg 200 mL/hr over 60 Minutes Intravenous Every 12 hours 07/20/21 2156 07/21/21 1326   07/20/21 2200  ceFEPIme (MAXIPIME) 2 g in sodium chloride 0.9  % 100 mL IVPB  Status:  Discontinued        2 g 200 mL/hr over 30 Minutes Intravenous Every 8 hours 07/20/21 2143 07/21/21 1326   07/20/21 2145  vancomycin (VANCOREADY) IVPB 1750 mg/350 mL        1,750 mg 175 mL/hr over 120 Minutes Intravenous  Once 07/20/21 2143 07/21/21 0308      Culture/Microbiology    Component Value Date/Time   SDES URINE, CATHETERIZED 07/22/2021 1737   SPECREQUEST  07/22/2021 1737    NONE Performed at [facility], [address] [place], [place] [address]    CULT (A) 07/22/2021 1737    30,000 COLONIES/mL KLEBSIELLA ORNITHINOLYTICA 40,000 COLONIES/mL YEAST    REPTSTATUS 07/25/2021 FINAL 07/22/2021 1737    Other culture-see note  Objective: Vitals: Today's Vitals   07/26/21 2038 07/26/21 2317 07/27/21 0330 07/27/21 0332  BP: (!) 146/98 (!) 157/89 (!) 152/99 (!) 157/95  Pulse: (!) 103 (!) 103 (!) 111 (!) 110  Resp: 18 18    Temp: 98.8 F (37.1 C) 98.7 F (37.1 C) 97.8 F (36.6 C)   TempSrc: Oral Oral Oral   SpO2: 100% 100% 100% 100%  PainSc: 0-No pain 0-No pain      Intake/Output Summary (Last 24 hours) at 07/27/2021 0730 Last data filed at 07/27/2021 0120 Gross per 24 hour  Intake --  Output 1700 ml  Net -1700 ml    There were no vitals filed for this visit. Weight change:   Intake/Output from previous day: 09/16 0701 - 09/17 0700 In: -  Out: 1700 [Urine:1700] Intake/Output this shift: No intake/output data recorded. There were no vitals filed for this visit.  Examination: General exam: AAO to self people place, tearful this morning, older than stated age, weak appearing. HEENT:Oral mucosa moist, Ear/Nose WNL grossly, dentition normal. Respiratory system: bilaterally clear, no use of accessory muscle Cardiovascular system: S1 & S2 +, No JVD,. Gastrointestinal system: Abdomen soft, PEG plus NT,ND, BS+ Nervous System:Alert, awake, moving extremities and grossly nonfocal Extremities: no edema, distal peripheral pulses palpable.   Skin: Excoriation mark, dry , raw skin trunk and extremities.  MSK: thin muscle bulk,tone, power   Data Reviewed: I have personally reviewed following labs and imaging studies CBC: Recent Labs  Lab 07/20/21 1754 07/20/21 1820 07/23/21 0352 07/24/21 0415 07/25/21 0318 07/26/21 0630 07/27/21 0142  WBC 12.5*   < > 8.7 8.8 10.4 12.8* 13.4*  NEUTROABS  8.8*  --   --   --   --   --   --   HGB 8.0*   < > 8.0* 8.5* 7.9* 8.3* 7.9*  HCT 26.0*   < > 25.1* 26.4* 25.1* 25.7* 25.4*  MCV 100.4*   < > 98.8 98.1 99.2 98.1 99.6  PLT 212   < > 223 239 255 281 284   < > = values in this interval not displayed.    Basic Metabolic Panel: Recent Labs  Lab 07/23/21 0352 07/24/21 0415 07/25/21 0318 07/26/21 0630 07/27/21 0142  NA 136 138 140 141 140  K 3.7 3.6 3.5 3.1* 3.8  CL 105 104 110 110 110  CO2 20* 19* 20* 22 20*  GLUCOSE 101* 86 97 113* 85  BUN 27* 29* 29* 23 24*  CREATININE 1.01* 1.13* 0.84 0.72 0.80  CALCIUM 8.1* 8.4* 8.4* 8.6* 8.7*    GFR: Estimated Creatinine Clearance: 66.4 mL/min (by C-G formula based on SCr of 0.8 mg/dL). Liver Function Tests: Recent Labs  Lab 07/20/21 1754 07/22/21 0324 07/23/21 0352 07/24/21 0415  AST 17 22 16 16   ALT 5 7 5 5   ALKPHOS 141* 112 103 128*  BILITOT 0.8 1.9* 1.7* 1.9*  PROT 7.4 7.0 6.3* 7.0  ALBUMIN 1.6* 1.6* 1.4* 1.6*    No results for input(s): LIPASE, AMYLASE in the last 168 hours. No results for input(s): AMMONIA in the last 168 hours. Coagulation Profile: No results for input(s): INR, PROTIME in the last 168 hours. Cardiac Enzymes: No results for input(s): CKTOTAL, CKMB, CKMBINDEX, TROPONINI in the last 168 hours. BNP (last 3 results) No results for input(s): PROBNP in the last 8760 hours. HbA1C: No results for input(s): HGBA1C in the last 72 hours.  CBG: Recent Labs  Lab 07/26/21 0408 07/26/21 0524 07/27/21 0104 07/27/21 0327 07/27/21 0623  GLUCAP 59* 105* 105* 82 80    Lipid Profile: No results for input(s):  CHOL, HDL, LDLCALC, TRIG, CHOLHDL, LDLDIRECT in the last 72 hours.  Thyroid Function Tests: No results for input(s): TSH, T4TOTAL, FREET4, T3FREE, THYROIDAB in the last 72 hours. Anemia Panel: No results for input(s): VITAMINB12, FOLATE, FERRITIN, TIBC, IRON, RETICCTPCT in the last 72 hours. Sepsis Labs: Recent Labs  Lab 07/20/21 1754  LATICACIDVEN 1.3     Recent Results (from the past 240 hour(s))  Resp Panel by RT-PCR (Flu A&B, Covid) Nasopharyngeal Swab     Status: None   Collection Time: 07/20/21  5:20 PM   Specimen: Nasopharyngeal Swab; Nasopharyngeal(NP) swabs in vial transport medium  Result Value Ref Range Status   SARS Coronavirus 2 by RT PCR NEGATIVE NEGATIVE Final    Comment: (NOTE) SARS-CoV-2 target nucleic acids are NOT DETECTED.  The SARS-CoV-2 RNA is generally detectable in upper respiratory specimens during the acute phase of infection. The lowest concentration of SARS-CoV-2 viral copies this assay can detect is 138 copies/mL. A negative result does not preclude SARS-Cov-2 infection and should not be used as the sole basis for treatment or other patient management decisions. A negative result may occur with  improper specimen collection/handling, submission of specimen other than nasopharyngeal swab, presence of viral mutation(s) within the areas targeted by this assay, and inadequate number of viral copies(<138 copies/mL). A negative result must be combined with clinical observations, patient history, and epidemiological information. The expected result is Negative.  Fact Sheet for Patients:  09/19/21  Fact Sheet for Healthcare Providers:  09/19/21  This test is no t yet approved or cleared by  the [employer] and  has been authorized for detection and/or diagnosis of SARS-CoV-2 by FDA under an Emergency Use Authorization (EUA). This EUA will remain  in effect (meaning this test can be  used) for the duration of the COVID-19 declaration under Section 564(b)(1) of the Act, 21 U.S.C.section 360bbb-3(b)(1), unless the authorization is terminated  or revoked sooner.       Influenza A by PCR NEGATIVE NEGATIVE Final   Influenza B by PCR NEGATIVE NEGATIVE Final    Comment: (NOTE) The Xpert Xpress SARS-CoV-2/FLU/RSV plus assay is intended as an aid in the diagnosis of influenza from Nasopharyngeal swab specimens and should not be used as a sole basis for treatment. Nasal washings and aspirates are unacceptable for Xpert Xpress SARS-CoV-2/FLU/RSV testing.  Fact Sheet for Patients: BloggerCourse.com  Fact Sheet for Healthcare Providers: SeriousBroker.it  This test is not yet approved or cleared by the [place] FDA and has been authorized for detection and/or diagnosis of SARS-CoV-2 by FDA under an Emergency Use Authorization (EUA). This EUA will remain in effect (meaning this test can be used) for the duration of the COVID-19 declaration under Section 564(b)(1) of the Act, 21 U.S.C. section 360bbb-3(b)(1), unless the authorization is terminated or revoked.  Performed at [facility], [address], [place], [place] [address]   Culture, blood (routine x 2)     Status: None   Collection Time: 07/20/21  6:06 PM   Specimen: BLOOD  Result Value Ref Range Status   Specimen Description BLOOD SITE NOT SPECIFIED  Final   Special Requests   Final    BOTTLES DRAWN AEROBIC AND ANAEROBIC Blood Culture adequate volume   Culture   Final    NO GROWTH 5 DAYS Performed at [facility], [address] [place], [place] [address]    Report Status 07/25/2021 FINAL  Final  Culture, blood (routine x 2)     Status: None   Collection Time: 07/20/21  6:09 PM   Specimen: BLOOD  Result Value Ref Range Status   Specimen Description BLOOD SITE NOT SPECIFIED  Final   Special Requests   Final    BOTTLES DRAWN AEROBIC ONLY  Blood Culture adequate volume   Culture   Final    NO GROWTH 5 DAYS Performed at [facility], [address] [place], [place] [address]    Report Status 07/25/2021 FINAL  Final  MRSA Next Gen by PCR, Nasal     Status: None   Collection Time: 07/20/21  9:44 PM   Specimen: Nasal Mucosa; Nasal Swab  Result Value Ref Range Status   MRSA by PCR Next Gen NOT DETECTED NOT DETECTED Final    Comment: (NOTE) The GeneXpert MRSA Assay (FDA approved for NASAL specimens only), is one component of a comprehensive MRSA colonization surveillance program. It is not intended to diagnose MRSA infection nor to guide or monitor treatment for MRSA infections. Test performance is not FDA approved in patients less than 30 years old. Performed at [facility], [address], [place], [place] [address]   Urine Culture     Status: Abnormal   Collection Time: 07/22/21  5:37 PM   Specimen: Urine, Catheterized  Result Value Ref Range Status   Specimen Description URINE, CATHETERIZED  Final   Special Requests   Final    NONE Performed at [facility], [address] [place], [place] [address]    Culture (A)  Final  30,000 COLONIES/mL KLEBSIELLA ORNITHINOLYTICA 40,000 COLONIES/mL YEAST    Report Status 07/25/2021 FINAL  Final   Organism ID, Bacteria KLEBSIELLA ORNITHINOLYTICA (A)  Final      Susceptibility   Klebsiella ornithinolytica - MIC*    AMPICILLIN >=32 RESISTANT Resistant     CEFAZOLIN >=64 RESISTANT Resistant     CEFEPIME >=32 RESISTANT Resistant     CEFTRIAXONE >=64 RESISTANT Resistant     CIPROFLOXACIN 1 SENSITIVE Sensitive     GENTAMICIN >=16 RESISTANT Resistant     IMIPENEM <=0.25 SENSITIVE Sensitive     NITROFURANTOIN 64 INTERMEDIATE Intermediate     TRIMETH/SULFA >=320 RESISTANT Resistant     AMPICILLIN/SULBACTAM >=32 RESISTANT Resistant     PIP/TAZO 32 INTERMEDIATE Intermediate     * 30,000 COLONIES/mL KLEBSIELLA ORNITHINOLYTICA      Radiology Studies: No  results found.   LOS: 7 days   Lanae Boast, MD [facility]  07/27/2021, 7:30 AM

## 2021-07-27 NOTE — Plan of Care (Signed)
  Problem: Activity: Goal: Risk for activity intolerance will decrease Outcome: Progressing   Problem: Nutrition: Goal: Adequate nutrition will be maintained Outcome: Progressing   Problem: Elimination: Goal: Will not experience complications related to bowel motility Outcome: Progressing Goal: Will not experience complications related to urinary retention Outcome: Progressing   

## 2021-07-28 LAB — COMPREHENSIVE METABOLIC PANEL
ALT: 5 U/L (ref 0–44)
AST: 16 U/L (ref 15–41)
Albumin: 1.7 g/dL — ABNORMAL LOW (ref 3.5–5.0)
Alkaline Phosphatase: 122 U/L (ref 38–126)
Anion gap: 10 (ref 5–15)
BUN: 25 mg/dL — ABNORMAL HIGH (ref 8–23)
CO2: 21 mmol/L — ABNORMAL LOW (ref 22–32)
Calcium: 8.4 mg/dL — ABNORMAL LOW (ref 8.9–10.3)
Chloride: 109 mmol/L (ref 98–111)
Creatinine, Ser: 0.78 mg/dL (ref 0.44–1.00)
GFR, Estimated: >60 mL/min (ref >60)
Glucose, Bld: 87 mg/dL (ref 70–99)
Potassium: 3.7 mmol/L (ref 3.5–5.1)
Sodium: 140 mmol/L (ref 135–145)
Total Bilirubin: 1.1 mg/dL (ref 0.3–1.2)
Total Protein: 6.7 g/dL (ref 6.5–8.1)

## 2021-07-28 LAB — CBC
HCT: 24.5 % — ABNORMAL LOW (ref 36.0–46.0)
Hemoglobin: 7.7 g/dL — ABNORMAL LOW (ref 12.0–15.0)
MCH: 31.2 pg (ref 26.0–34.0)
MCHC: 31.4 g/dL (ref 30.0–36.0)
MCV: 99.2 fL (ref 80.0–100.0)
Platelets: 280 10*3/uL (ref 150–400)
RBC: 2.47 MIL/uL — ABNORMAL LOW (ref 3.87–5.11)
RDW: 17.8 % — ABNORMAL HIGH (ref 11.5–15.5)
WBC: 13.3 10*3/uL — ABNORMAL HIGH (ref 4.0–10.5)
nRBC: 0 % (ref 0.0–0.2)

## 2021-07-28 LAB — GLUCOSE, CAPILLARY
Glucose-Capillary: 100 mg/dL — ABNORMAL HIGH (ref 70–99)
Glucose-Capillary: 129 mg/dL — ABNORMAL HIGH (ref 70–99)
Glucose-Capillary: 61 mg/dL — ABNORMAL LOW (ref 70–99)
Glucose-Capillary: 70 mg/dL (ref 70–99)
Glucose-Capillary: 74 mg/dL (ref 70–99)
Glucose-Capillary: 79 mg/dL (ref 70–99)
Glucose-Capillary: 94 mg/dL (ref 70–99)

## 2021-07-28 MED ORDER — THIAMINE HCL 100 MG PO TABS
100.0000 mg | ORAL_TABLET | Freq: Three times a day (TID) | ORAL | Status: DC
  Administered 2021-07-28 – 2021-08-06 (×28): 100 mg
  Filled 2021-07-28 (×28): qty 1

## 2021-07-28 MED ORDER — SODIUM CHLORIDE 0.9 % IV BOLUS
500.0000 mL | Freq: Once | INTRAVENOUS | Status: AC
  Administered 2021-07-28: 500 mL via INTRAVENOUS

## 2021-07-28 MED ORDER — ACETAMINOPHEN 160 MG/5ML PO SOLN
500.0000 mg | Freq: Four times a day (QID) | ORAL | Status: DC
  Administered 2021-07-28: 500 mg
  Filled 2021-07-28: qty 20.3

## 2021-07-28 MED ORDER — ACETAMINOPHEN 160 MG/5ML PO SOLN
500.0000 mg | Freq: Three times a day (TID) | ORAL | Status: DC
  Administered 2021-07-28 – 2021-08-06 (×27): 500 mg
  Filled 2021-07-28 (×26): qty 20.3

## 2021-07-28 MED ORDER — METOPROLOL TARTRATE 25 MG/10 ML ORAL SUSPENSION
25.0000 mg | Freq: Two times a day (BID) | ORAL | Status: DC
  Administered 2021-07-28 – 2021-08-04 (×15): 25 mg
  Filled 2021-07-28 (×17): qty 10

## 2021-07-28 MED ORDER — ATORVASTATIN CALCIUM 40 MG PO TABS
40.0000 mg | ORAL_TABLET | Freq: Every day | ORAL | Status: DC
  Administered 2021-07-28 – 2021-08-05 (×9): 40 mg
  Filled 2021-07-28 (×9): qty 1

## 2021-07-28 MED ORDER — OLANZAPINE 2.5 MG PO TABS: 2.5000 mg | ORAL_TABLET | Freq: Every day | ORAL | Status: DC

## 2021-07-28 MED ORDER — FREE WATER
200.0000 mL | Status: DC
  Administered 2021-07-28 – 2021-08-06 (×55): 200 mL

## 2021-07-28 MED ORDER — METOPROLOL TARTRATE 25 MG/10 ML ORAL SUSPENSION
12.5000 mg | Freq: Two times a day (BID) | ORAL | Status: DC
  Administered 2021-07-28: 12.5 mg
  Filled 2021-07-28 (×2): qty 5

## 2021-07-28 MED ORDER — DIPHENHYDRAMINE HCL 12.5 MG/5ML PO ELIX
25.0000 mg | ORAL_SOLUTION | Freq: Four times a day (QID) | ORAL | Status: DC | PRN
  Administered 2021-07-29 – 2021-08-06 (×14): 25 mg
  Filled 2021-07-28 (×17): qty 10

## 2021-07-28 MED ORDER — OLANZAPINE 2.5 MG PO TABS
5.0000 mg | ORAL_TABLET | Freq: Every day | ORAL | Status: DC
  Administered 2021-07-28 – 2021-08-05 (×9): 5 mg
  Filled 2021-07-28 (×10): qty 2

## 2021-07-28 MED ORDER — ACETAMINOPHEN 500 MG PO TABS: 500.0000 mg | ORAL_TABLET | Freq: Four times a day (QID) | ORAL | Status: DC | PRN

## 2021-07-28 MED ORDER — ASPIRIN 81 MG PO CHEW
81.0000 mg | CHEWABLE_TABLET | Freq: Every day | ORAL | Status: DC
  Administered 2021-07-28 – 2021-08-06 (×10): 81 mg
  Filled 2021-07-28 (×11): qty 1

## 2021-07-28 MED ORDER — DEXTROSE 50 % IV SOLN
INTRAVENOUS | Status: AC
  Filled 2021-07-28: qty 50

## 2021-07-28 NOTE — Progress Notes (Signed)
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 She was discharged on IV cefazolin and rifampin through 08/11/2021. That hospital course was complicated by acute encephalopathy, dysphagia needing PEG tube, urinary retention/UTI requiring Foley catheter. She was discharged to SNF, and subsequently readmitted from the nursing facility due to shortness of breath and more confusion than usual baseline.  Significant Events:  In the ED:CT head showed acute/subacute possible pontine infarct, CT abdomen pelvis showed dystrophic changes inferior endplate of T12, L1 vertebral body, narrowing of the intervening intervertebral disc and a small amount of soft tissue thickening anteriorly, new finding compared to previous CTs and may represent sequelae of discitis/osteomyelitis, MRI lumbar C-spine was ordered and lab work showed leukocytosis 12.5, CT chest small amount of airspace consolidation versus atelectasis in the bilateral lung bases. ED called [facility] per daughter request but no bed, then patient was admitted MRI brain positive for 1.5 cm? acute to subacute ischemic nonhemorrhagic central pontine infarct. MRI C-spine and L spine shows -vertebral -cervical/lumbar osteomyelitis with epidural abscesses, paraspinal muscle abscesses. 9/11 Neurosurgery, ID, Neurology was consulted Neurosurgery did not advise acute surgical intervention/ I&D. Also seen by speech and placed on pured diet, was n.p.o. plus tube feed previously 9/13:Repeat MRI brain w/ contrast:no intracranial focus of abnormal contrast enhancement. 9/14 ID recommended to continue with cefazolin through 08/25/2021,  outpatient ID follow-up, overall treatment failure in the setting of hardware is higher especially in the setting of Staph aureus  Neurology and ID signed off, Neurosurgery saw on  9/15 and signed off  Consultants:  Neurosurgery ID Neurology  Code Status: FULL CODE  Antimicrobials:  Cefepime 9/10 Vancomycin 9/10 > 9/12 Nafcillin 9/11 > 9/14 Rifampin 9/11 Cefazolin 9/14 >  DVT prophylaxis: Lovenox  Subjective: Afebrile.  Blood pressure elevated above goal.  Some tachycardia with heart rates into the 120s.  Saturations stable.  The patient is being attended to by the staff at the time of visit and is very agitated.  She is being verbally abusive to the staff and refusing to comply with simple cleaning procedures.  She does not appear to be in acute distress.  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 -recent ACDF surgery/evacuation of epidural abscess C4-C6 06/16/2021 Was on long-term antibiotics at SNF through 08/11/2021 - MRI noted persistent/recurrent infection of area - has been reevaluated by Neurosurgery and ID during this hospitalization with no need for repeat surgical intervention -continue antibiotics through 08/25/2021 with outpatient ID follow-up - high likelihood of treatment failure with hardware in place with staff aureus infection  Acute metabolic encephalopathy At time of recent discharge was reportedly able to follow simple commands and was moving all of her extremities - evaluated extensively by neurology this admission - remains altered at time of my visit today - very much awake but agitated and confused   Subacute pontine CVA Noted on noncontrasted MRI at time of admission through ED -felt to be small vessel disease in etiology -A1c 5.1 -LDL 88 -CT angio head and neck reveals no large vessel disease -no significant valvular findings on TTE -plan to continue ASA 81 mg plus Lipitor 40 mg  daily  Generalized weakness -deconditioning -functional quadriplegia -ambulatory dysfunction Multifactorial due to cervical myelopathy/infection/encephalopathy -PT/OT to continue  CKD stage II Renal function stable at present  Diarrhea Appears to have resolved by changing from Osmolite to JVD feeding  Hypokalemia Corrected with supplementation  Recurrent urinary retention requiring Foley catheter insertion  Moderate protein calorie malnutrition  Chronic dermatitis with pruritus -foot hyperkeratosis Atarax as needed -skin cream  HTN Blood pressure not yet at goal -adjust medical therapy again today  Chronic anemia Hemoglobin stable    Family Communication:  Status is: Inpatient  Remains inpatient appropriate because:Inpatient level of care appropriate due to severity of illness  Dispo: The patient is from: SNF              Anticipated d/c is to: SNF              Patient currently is not medically stable to d/c.   Difficult to place patient No   Objective: Blood pressure (!) 169/91, pulse (!) 103, temperature 98.5 F (36.9 C), temperature source Oral, resp. rate 18, SpO2 100 %.  Intake/Output Summary (Last 24 hours) at 07/28/2021 0802 Last data filed at 07/27/2021 1251 Gross per 24 hour  Intake --  Output 1200 ml  Net -1200 ml   There were no vitals filed for this visit.  Examination: General: No acute respiratory distress Lungs: Clear to auscultation bilaterally without wheezes or crackles Cardiovascular: Tachycardic but regular without murmur or rub Abdomen: Nontender, nondistended, soft, bowel sounds positive, no rebound, no ascites, no appreciable mass Extremities: No significant cyanosis, clubbing, or edema bilateral lower extremities  CBC: Recent Labs  Lab 07/26/21 0630 07/27/21 0142 07/28/21 0346  WBC 12.8* 13.4* 13.3*  HGB 8.3* 7.9* 7.7*  HCT 25.7* 25.4* 24.5*  MCV 98.1 99.6 99.2  PLT 281 284 280   Basic Metabolic Panel: Recent Labs  Lab  07/26/21 0630 07/27/21 0142 07/28/21 0346  NA 141 140 140  K 3.1* 3.8 3.7  CL 110 110 109  CO2 22 20* 21*  GLUCOSE 113* 85 87  BUN 23 24* 25*  CREATININE 0.72 0.80 0.78  CALCIUM 8.6* 8.7* 8.4*   GFR: Estimated Creatinine Clearance: 66.4 mL/min (by C-G formula based on SCr of 0.78 mg/dL).  Liver Function Tests: Recent Labs  Lab 07/22/21 0324 07/23/21 0352 07/24/21 0415 07/28/21 0346  AST 22 16 16 16   ALT 7 5 5 5   ALKPHOS 112 103 128* 122  BILITOT 1.9* 1.7* 1.9* 1.1  PROT 7.0 6.3* 7.0 6.7  ALBUMIN 1.6* 1.4* 1.6* 1.7*    HbA1C: Hgb A1c MFr Bld  Date/Time Value Ref Range Status  07/21/2021 05:00 AM 5.1 4.8 - 5.6 % Final    Comment:    (NOTE) Pre diabetes:          5.7%-6.4%  Diabetes:              >6.4%  Glycemic control for   <7.0% adults with diabetes     CBG: Recent Labs  Lab 07/27/21 1625 07/27/21 1942 07/27/21 2347 07/28/21 0402 07/28/21 0734  GLUCAP 103* 76 95 74 94    Recent Results (from the past 240 hour(s))  Resp Panel by RT-PCR (Flu A&B, Covid) Nasopharyngeal Swab     Status: None   Collection Time: 07/20/21  5:20 PM   Specimen: Nasopharyngeal Swab; Nasopharyngeal(NP) swabs in vial transport medium  Result Value Ref Range Status   SARS Coronavirus 2 by RT PCR NEGATIVE NEGATIVE Final    Comment: (NOTE) SARS-CoV-2 target  nucleic acids are NOT DETECTED.  The SARS-CoV-2 RNA is generally detectable in upper respiratory specimens during the acute phase of infection. The lowest concentration of SARS-CoV-2 viral copies this assay can detect is 138 copies/mL. A negative result does not preclude SARS-Cov-2 infection and should not be used as the sole basis for treatment or other patient management decisions. A negative result may occur with  improper specimen collection/handling, submission of specimen other than nasopharyngeal swab, presence of viral mutation(s) within the areas targeted by this assay, and inadequate number of viral copies(<138  copies/mL). A negative result must be combined with clinical observations, patient history, and epidemiological information. The expected result is Negative.  Fact Sheet for Patients:  BloggerCourse.com  Fact Sheet for Healthcare Providers:  SeriousBroker.it  This test is no t yet approved or cleared by the [place] FDA and  has been authorized for detection and/or diagnosis of SARS-CoV-2 by FDA under an Emergency Use Authorization (EUA). This EUA will remain  in effect (meaning this test can be used) for the duration of the COVID-19 declaration under Section 564(b)(1) of the Act, 21 U.S.C.section 360bbb-3(b)(1), unless the authorization is terminated  or revoked sooner.       Influenza A by PCR NEGATIVE NEGATIVE Final   Influenza B by PCR NEGATIVE NEGATIVE Final    Comment: (NOTE) The Xpert Xpress SARS-CoV-2/FLU/RSV plus assay is intended as an aid in the diagnosis of influenza from Nasopharyngeal swab specimens and should not be used as a sole basis for treatment. Nasal washings and aspirates are unacceptable for Xpert Xpress SARS-CoV-2/FLU/RSV testing.  Fact Sheet for Patients: BloggerCourse.com  Fact Sheet for Healthcare Providers: SeriousBroker.it  This test is not yet approved or cleared by the [place] FDA and has been authorized for detection and/or diagnosis of SARS-CoV-2 by FDA under an Emergency Use Authorization (EUA). This EUA will remain in effect (meaning this test can be used) for the duration of the COVID-19 declaration under Section 564(b)(1) of the Act, 21 U.S.C. section 360bbb-3(b)(1), unless the authorization is terminated or revoked.  Performed at [facility], [address], [place], [place] [address]   Culture, blood (routine x 2)     Status: None   Collection Time: 07/20/21  6:06 PM   Specimen: BLOOD  Result Value Ref Range Status    Specimen Description BLOOD SITE NOT SPECIFIED  Final   Special Requests   Final    BOTTLES DRAWN AEROBIC AND ANAEROBIC Blood Culture adequate volume   Culture   Final    NO GROWTH 5 DAYS Performed at [facility], [address], [place], [place] [address]    Report Status 07/25/2021 FINAL  Final  Culture, blood (routine x 2)     Status: None   Collection Time: 07/20/21  6:09 PM   Specimen: BLOOD  Result Value Ref Range Status   Specimen Description BLOOD SITE NOT SPECIFIED  Final   Special Requests   Final    BOTTLES DRAWN AEROBIC ONLY Blood Culture adequate volume   Culture   Final    NO GROWTH 5 DAYS Performed at [facility], [address] [place], [place] [address]    Report Status 07/25/2021 FINAL  Final  MRSA Next Gen by PCR, Nasal     Status: None   Collection Time: 07/20/21  9:44 PM   Specimen: Nasal Mucosa; Nasal Swab  Result Value Ref Range Status   MRSA by PCR Next Gen NOT DETECTED NOT DETECTED  Final    Comment: (NOTE) The GeneXpert MRSA Assay (FDA approved for NASAL specimens only), is one component of a comprehensive MRSA colonization surveillance program. It is not intended to diagnose MRSA infection nor to guide or monitor treatment for MRSA infections. Test performance is not FDA approved in patients less than 82 years old. Performed at [facility], [address] [place], [place] [address]   Urine Culture     Status: Abnormal   Collection Time: 07/22/21  5:37 PM   Specimen: Urine, Catheterized  Result Value Ref Range Status   Specimen Description URINE, CATHETERIZED  Final   Special Requests   Final    NONE Performed at [facility], [address], [place], [place] [address]    Culture (A)  Final    30,000 COLONIES/mL KLEBSIELLA ORNITHINOLYTICA 40,000 COLONIES/mL YEAST    Report Status 07/25/2021 FINAL  Final   Organism ID, Bacteria KLEBSIELLA ORNITHINOLYTICA (A)  Final      Susceptibility   Klebsiella ornithinolytica  - MIC*    AMPICILLIN >=32 RESISTANT Resistant     CEFAZOLIN >=64 RESISTANT Resistant     CEFEPIME >=32 RESISTANT Resistant     CEFTRIAXONE >=64 RESISTANT Resistant     CIPROFLOXACIN 1 SENSITIVE Sensitive     GENTAMICIN >=16 RESISTANT Resistant     IMIPENEM <=0.25 SENSITIVE Sensitive     NITROFURANTOIN 64 INTERMEDIATE Intermediate     TRIMETH/SULFA >=320 RESISTANT Resistant     AMPICILLIN/SULBACTAM >=32 RESISTANT Resistant     PIP/TAZO 32 INTERMEDIATE Intermediate     * 30,000 COLONIES/mL KLEBSIELLA ORNITHINOLYTICA     Scheduled Meds:   stroke: mapping our early stages of recovery book   Does not apply Once   acetaminophen  650 mg Per Tube Q6H   allopurinol  100 mg Per Tube Daily   aspirin  81 mg Per Tube Daily   atorvastatin  40 mg Per Tube QHS   Chlorhexidine Gluconate Cloth  6 each Topical Daily   diclofenac Sodium  4 g Topical TID AC & HS   enoxaparin (LOVENOX) injection  40 mg Subcutaneous Q24H   feeding supplement (PROSource TF)  45 mL Per Tube TID   free water  125 mL Per Tube Q4H   lubriderm seriously sensitive   Topical BID   OLANZapine  2.5 mg Per Tube QHS   potassium chloride  20 mEq Per Tube Daily   saccharomyces boulardii  250 mg Per Tube BID   sodium chloride flush  10-40 mL Intracatheter Q12H   thiamine injection  100 mg Intravenous Q8H   triamcinolone 0.1 % cream : eucerin   Topical BID   Continuous Infusions:   ceFAZolin (ANCEF) IV 2 g (07/28/21 0615)   feeding supplement (JEVITY 1.5 CAL/FIBER) 1,000 mL (07/27/21 1435)     LOS: 8 days   Lonia Blood, MD [facility] Office  [phone] Pager - Text Page per Amion  If 7PM-7AM, please contact night-coverage per Amion 07/28/2021, 8:02 AM

## 2021-07-28 NOTE — NC FL2 (Signed)
[place] MEDICAID FL2 LEVEL OF CARE SCREENING TOOL     IDENTIFICATION  Patient Name: Kristy [name] Birthdate: 04/30/1946 Sex: female Admission Date (Current Location): 07/20/2021  [facility] and [place] Number:  Producer, television/film/video and Address:  [facility] [place], [address], [place], [place] [address]      Provider Number: [id number]  Attending Physician Name and Address:  Lonia Blood, MD  Relative Name and Phone Number:  Nidya Bouyer, daughter - [phone]    Current Level of Care: Hospital Recommended Level of Care: Skilled Nursing Facility Prior Approval Number:    Date Approved/Denied:   PASRR Number: [id number]  Discharge Plan: SNF    Current Diagnoses: Patient Active Problem List   Diagnosis Date Noted   HCAP (healthcare-associated pneumonia)    Goals of care, counseling/discussion    Vertebral osteomyelitis (HCC)    Epidural abscess    Generalized weakness 07/20/2021   Blood loss anemia 07/12/2021   Abnormal urinalysis 07/11/2021   Acute urinary retention 07/11/2021   MSSA (methicillin susceptible Staphylococcus aureus) infection/UTI-POA 07/11/2021   Cervical discitis 07/11/2021   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) 07/11/2021   Dysphagia, neurologic  07/11/2021   CKD stage G3b/A1, 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) 07/11/2021   HTN (hypertension) 07/11/2021   Grade I diastolic dysfunction 07/11/2021   HLD (hyperlipidemia) 07/11/2021   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   Malnutrition of moderate degree 06/18/2021   Metabolic encephalopathy 06/14/2021   Acute delirium 06/14/2021   Sepsis (HCC) 06/14/2021   Fall 06/11/2021   Alcoholic hepatitis 06/11/2021    Orientation RESPIRATION BLADDER Height & Weight     Self  Normal Indwelling catheter Weight:   Height:     BEHAVIORAL SYMPTOMS/MOOD NEUROLOGICAL BOWEL NUTRITION STATUS      Incontinent Feeding  tube (PEG feeding tube - Osmolite 1.2 Cal at 65 cc/hr)  AMBULATORY STATUS COMMUNICATION OF NEEDS Skin   Extensive Assist Verbally Normal, Other (Comment), Surgical wounds (PEG tube for tube feedings, skin glue intact to right neck surgical incision, multiple skin tears on buttocks)                       Personal Care Assistance Level of Assistance  Bathing, Feeding, Dressing Bathing Assistance: Maximum assistance Feeding assistance: Maximum assistance Dressing Assistance: Maximum assistance     Functional Limitations Info  Sight, Hearing, Speech Sight Info: Impaired Hearing Info: Adequate Speech Info: Adequate    SPECIAL CARE FACTORS FREQUENCY  PT (By licensed PT), OT (By licensed OT)     PT Frequency: 2-3x week OT Frequency: 2-3x week            Contractures Contractures Info: Not present    Additional Factors Info  Code Status, Allergies Code Status Info: Full Allergies Info: NKA           Current Medications (07/28/2021):  This is the current hospital active medication list Current Facility-Administered Medications  Medication Dose Route Frequency Provider Last Rate Last Admin    stroke: mapping our early stages of recovery book   Does not apply Once Milon Dikes, MD       acetaminophen (TYLENOL) 160 MG/5ML solution 650 mg  650 mg Per Tube Q6H Meredeth Ide, MD   650 mg at 07/28/21 5631   acetaminophen (TYLENOL) tablet 650 mg  650 mg Per Tube Q6H PRN Meredeth Ide, MD   650 mg  at 07/24/21 2309   Or   acetaminophen (TYLENOL) suppository 650 mg  650 mg Rectal Q6H PRN Meredeth Ide, MD       allopurinol (ZYLOPRIM) tablet 100 mg  100 mg Per Tube Daily Meredeth Ide, MD   100 mg at 07/27/21 1044   aspirin chewable tablet 81 mg  81 mg Per Tube Daily Pierce, Dwayne A, RPH       atorvastatin (LIPITOR) tablet 40 mg  40 mg Per Tube QHS Pierce, Dwayne A, RPH       ceFAZolin (ANCEF) IVPB 2g/100 mL premix  2 g Intravenous Q8H Vu, Trung T, MD 200 mL/hr at 07/28/21 0615 2  g at 07/28/21 0615   Chlorhexidine Gluconate Cloth 2 % PADS 6 each  6 each Topical Daily Meredeth Ide, MD   6 each at 07/27/21 1209   diclofenac Sodium (VOLTAREN) 1 % topical gel 4 g  4 g Topical TID AC & HS Meredeth Ide, MD   4 g at 07/27/21 2102   diphenhydrAMINE (BENADRYL) injection 25 mg  25 mg Intravenous Q6H PRN Dellinger, Marianne L, PA-C   25 mg at 07/27/21 1044   enoxaparin (LOVENOX) injection 40 mg  40 mg Subcutaneous Q24H Kc, Ramesh, MD   40 mg at 07/27/21 1428   feeding supplement (JEVITY 1.5 CAL/FIBER) liquid 1,000 mL  1,000 mL Per Tube Continuous Kc, Dayna Barker, MD 55 mL/hr at 07/27/21 1435 1,000 mL at 07/27/21 1435   feeding supplement (PROSource TF) liquid 45 mL  45 mL Per Tube TID Lanae Boast, MD   45 mL at 07/27/21 2102   free water 125 mL  125 mL Per Tube Q4H Meredeth Ide, MD   125 mL at 07/28/21 0402   hydrOXYzine (ATARAX/VISTARIL) tablet 50 mg  50 mg Per Tube Q6H PRN Lanae Boast, MD   50 mg at 07/28/21 0356   loperamide HCl (IMODIUM) 1 MG/7.5ML suspension 2 mg  2 mg Per Tube PRN Lanae Boast, MD   2 mg at 07/25/21 1604   lubriderm seriously sensitive lotion   Topical BID Dellinger, Tora Kindred, PA-C   Given at 07/27/21 2102   OLANZapine (ZYPREXA) tablet 2.5 mg  2.5 mg Per Tube QHS Pierce, Dwayne A, RPH       ondansetron (ZOFRAN) tablet 4 mg  4 mg Per Tube Q6H PRN Meredeth Ide, MD       Or   ondansetron (ZOFRAN) injection 4 mg  4 mg Intravenous Q6H PRN Meredeth Ide, MD       oxyCODONE (Oxy IR/ROXICODONE) immediate release tablet 5 mg  5 mg Per Tube Q6H PRN Meredeth Ide, MD   5 mg at 07/25/21 1453   potassium chloride (KLOR-CON) packet 20 mEq  20 mEq Per Tube Daily Kc, Ramesh, MD   20 mEq at 07/27/21 1045   saccharomyces boulardii (FLORASTOR) capsule 250 mg  250 mg Per Tube BID Kc, Ramesh, MD   250 mg at 07/27/21 2102   sodium chloride flush (NS) 0.9 % injection 10-40 mL  10-40 mL Intracatheter Q12H Kc, Ramesh, MD   10 mL at 07/27/21 2103   sodium chloride flush (NS) 0.9 %  injection 10-40 mL  10-40 mL Intracatheter PRN Kc, Ramesh, MD       sodium chloride flush (NS) 0.9 % injection 10-40 mL  10-40 mL Intracatheter PRN Kc, Ramesh, MD       thiamine (B-1) injection 100 mg  100 mg Intravenous Q8H Kc, Ramesh,  MD   100 mg at 07/28/21 0938   triamcinolone 0.1 % cream : eucerin cream, 1:1   Topical BID Lanae Boast, MD   Given at 07/27/21 2102     Discharge Medications: Please see discharge summary for a list of discharge medications.  Relevant Imaging Results:  Relevant Lab Results:   Additional Information SS# [id number], COVID vaccinations - Pfizer 07/02/2021, 02/28/2020, 02/06/2020  [name], LCSWA

## 2021-07-28 NOTE — Progress Notes (Signed)
Pt MEWS score changed to yellow. HR increased MD notified

## 2021-07-28 NOTE — Plan of Care (Signed)
  Problem: Coping: Goal: Level of anxiety will decrease Outcome: Progressing   Problem: Pain Managment: Goal: General experience of comfort will improve Outcome: Progressing   Problem: Safety: Goal: Ability to remain free from injury will improve Outcome: Progressing   Problem: Skin Integrity: Goal: Risk for impaired skin integrity will decrease Outcome: Progressing   

## 2021-07-29 LAB — CBC
HCT: 25.7 % — ABNORMAL LOW (ref 36.0–46.0)
Hemoglobin: 8.1 g/dL — ABNORMAL LOW (ref 12.0–15.0)
MCH: 31.6 pg (ref 26.0–34.0)
MCHC: 31.5 g/dL (ref 30.0–36.0)
MCV: 100.4 fL — ABNORMAL HIGH (ref 80.0–100.0)
Platelets: 265 10*3/uL (ref 150–400)
RBC: 2.56 MIL/uL — ABNORMAL LOW (ref 3.87–5.11)
RDW: 18.3 % — ABNORMAL HIGH (ref 11.5–15.5)
WBC: 11.5 10*3/uL — ABNORMAL HIGH (ref 4.0–10.5)
nRBC: 0 % (ref 0.0–0.2)

## 2021-07-29 LAB — BASIC METABOLIC PANEL
Anion gap: 10 (ref 5–15)
BUN: 24 mg/dL — ABNORMAL HIGH (ref 8–23)
CO2: 20 mmol/L — ABNORMAL LOW (ref 22–32)
Calcium: 8.5 mg/dL — ABNORMAL LOW (ref 8.9–10.3)
Chloride: 108 mmol/L (ref 98–111)
Creatinine, Ser: 0.72 mg/dL (ref 0.44–1.00)
GFR, Estimated: >60 mL/min (ref >60)
Glucose, Bld: 95 mg/dL (ref 70–99)
Potassium: 3.8 mmol/L (ref 3.5–5.1)
Sodium: 138 mmol/L (ref 135–145)

## 2021-07-29 LAB — GLUCOSE, CAPILLARY
Glucose-Capillary: 109 mg/dL — ABNORMAL HIGH (ref 70–99)
Glucose-Capillary: 76 mg/dL (ref 70–99)
Glucose-Capillary: 85 mg/dL (ref 70–99)
Glucose-Capillary: 85 mg/dL (ref 70–99)
Glucose-Capillary: 87 mg/dL (ref 70–99)
Glucose-Capillary: 92 mg/dL (ref 70–99)

## 2021-07-29 MED ORDER — ELDERTONIC PO LIQD
15.0000 mL | Freq: Every day | ORAL | Status: DC
  Administered 2021-07-29 – 2021-08-06 (×9): 15 mL via ORAL
  Filled 2021-07-29 (×10): qty 15

## 2021-07-29 MED ORDER — OLANZAPINE 2.5 MG PO TABS
2.5000 mg | ORAL_TABLET | Freq: Every day | ORAL | Status: DC
  Administered 2021-07-30 – 2021-08-06 (×8): 2.5 mg
  Filled 2021-07-29 (×8): qty 1

## 2021-07-29 MED ORDER — MELATONIN 3 MG PO TABS
3.0000 mg | ORAL_TABLET | Freq: Every day | ORAL | Status: DC
  Administered 2021-07-29 – 2021-08-05 (×8): 3 mg via ORAL
  Filled 2021-07-29 (×9): qty 1

## 2021-07-29 NOTE — Plan of Care (Signed)

## 2021-07-29 NOTE — TOC Progression Note (Signed)
Transition of Care [facility]) - Progression Note    Patient Details  Name: YEILA MORRO MRN: [id number] Date of Birth: 1946-10-12  Transition of Care [facility]) CM/SW Contact  Baldemar Lenis, [place] Phone Number: 07/29/2021, 4:05 PM  Clinical Narrative:   CSW asked [facility] to review the patient's referral, no response received as of yet. CSW also received a call from [place] with [facility] that she had spoken with the daughter about home health services. CSW contacted daughter, [name], about the call and asked if she wanted to take the patient home instead. [name] said she did not give authorization for Britney to call CSW, and that she is still interested in SNF placement. CSW to follow.    Expected Discharge Plan: Skilled Nursing Facility Barriers to Discharge: Continued Medical Work up, SNF Pending bed offer  Expected Discharge Plan and Services Expected Discharge Plan: Skilled Nursing Facility     Post Acute Care Choice: Skilled Nursing Facility Living arrangements for the past 2 months: Skilled Nursing Facility                                       Social Determinants of Health (SDOH) Interventions    Readmission Risk Interventions No flowsheet data found.

## 2021-07-29 NOTE — Consult Note (Addendum)
Reason for Consult:Verbally abusive, agitated Referring Physician: Lanae Boast, MD  Kristy [name] is an 75 y.o. female.  HPI:   Kristy [name]  is a 75 y.o. 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    Patient seen in mid-AM. Lying diagonally in bed with one foot hanging off of bed; refused repositioning when offered. She is oriented to self, location (hospital + [place]). Not oriented to month (stated March), or year (stated February 8th). She was able to state the days of the weeks backwards without issue. Patient had some difficulty explaining why she was in the hospital when asked she responded with "I don't know why".  Answers often tangential to question posed: when asked about pain commented that her asthma is coming back.   Denied any psych history, history of or current SI, HI, psychotropic medication use. Denied hallucinations. Shortly after psych team left the room NA came in to try to move her to bathroom and she became more agitated.   What appears to be perioral skin lesions c/w cheliosis.   Throughout the entire interview she continued to focus on getting nurse to help her to the bathroom. Despite reassurances that they would be called after the interview she continued to fixate on this and would not answer  other questions.  History reviewed. No pertinent past medical history.  Past Surgical History:  Procedure Laterality Date   ANTERIOR CERVICAL DECOMP/DISCECTOMY FUSION N/A 06/16/2021   Procedure: ANTERIOR CERVICAL DISCECTOMY FUSION Cervical five -Cervical six, Evacuation of Epidural Abscess;  Surgeon: Jadene Pierini, MD;  Location: [facility] OR;  Service: Neurosurgery;  Laterality: N/A;   IR GASTROSTOMY TUBE MOD SED  07/02/2021    No family history on file.  Social History:  has no history on file for tobacco use, alcohol use, and drug use.  Allergies: No Known Allergies  Medications: I have reviewed the patient's current medications.  Results for orders placed or performed during the hospital encounter of 07/20/21 (from the past 48 hour(s))  Glucose, capillary     Status: None   Collection Time: 07/27/21 12:42 PM  Result Value Ref Range   Glucose-Capillary 87 70 - 99 mg/dL    Comment: Glucose reference range applies only to samples taken after fasting for at least 8 hours.  Glucose, capillary     Status: Abnormal   Collection Time: 07/27/21  4:25 PM  Result Value Ref Range   Glucose-Capillary 103 (H) 70 - 99 mg/dL    Comment: Glucose reference range applies only to samples taken after fasting for at least 8 hours.  Glucose, capillary     Status: None   Collection Time: 07/27/21  7:42 PM  Result Value Ref Range   Glucose-Capillary 76 70 - 99 mg/dL    Comment: Glucose reference range applies only to samples taken after fasting for at least 8 hours.  Glucose, capillary     Status: None  Collection Time: 07/27/21 11:47 PM  Result Value Ref Range   Glucose-Capillary 95 70 - 99 mg/dL    Comment: Glucose reference range applies only to samples taken after fasting for at least 8 hours.  CBC     Status: Abnormal   Collection Time: 07/28/21  3:46 AM  Result Value Ref Range   WBC 13.3 (H) 4.0 - 10.5 K/uL   RBC 2.47 (L) 3.87 - 5.11 MIL/uL   Hemoglobin 7.7 (L) 12.0 - 15.0 g/dL   HCT 37.6  (L) 28.3 - 46.0 %   MCV 99.2 80.0 - 100.0 fL   MCH 31.2 26.0 - 34.0 pg   MCHC 31.4 30.0 - 36.0 g/dL   RDW 15.1 (H) 76.1 - 60.7 %   Platelets 280 150 - 400 K/uL   nRBC 0.0 0.0 - 0.2 %    Comment: Performed at [facility], [address], [place], [place] [address]  Comprehensive metabolic panel     Status: Abnormal   Collection Time: 07/28/21  3:46 AM  Result Value Ref Range   Sodium 140 135 - 145 mmol/L   Potassium 3.7 3.5 - 5.1 mmol/L   Chloride 109 98 - 111 mmol/L   CO2 21 (L) 22 - 32 mmol/L   Glucose, Bld 87 70 - 99 mg/dL    Comment: Glucose reference range applies only to samples taken after fasting for at least 8 hours.   BUN 25 (H) 8 - 23 mg/dL   Creatinine, Ser 2.69 0.44 - 1.00 mg/dL   Calcium 8.4 (L) 8.9 - 10.3 mg/dL   Total Protein 6.7 6.5 - 8.1 g/dL   Albumin 1.7 (L) 3.5 - 5.0 g/dL   AST 16 15 - 41 U/L   ALT 5 0 - 44 U/L   Alkaline Phosphatase 122 38 - 126 U/L   Total Bilirubin 1.1 0.3 - 1.2 mg/dL   GFR, Estimated >48 >54 mL/min    Comment: (NOTE) Calculated using the CKD-EPI Creatinine Equation (2021)    Anion gap 10 5 - 15    Comment: Performed at [facility], [address], [place], [place] [address]  Glucose, capillary     Status: None   Collection Time: 07/28/21  4:02 AM  Result Value Ref Range   Glucose-Capillary 74 70 - 99 mg/dL    Comment: Glucose reference range applies only to samples taken after fasting for at least 8 hours.  Glucose, capillary     Status: None   Collection Time: 07/28/21  7:34 AM  Result Value Ref Range   Glucose-Capillary 94 70 - 99 mg/dL    Comment: Glucose reference range applies only to samples taken after fasting for at least 8 hours.  Glucose, capillary     Status: Abnormal   Collection Time: 07/28/21 11:23 AM  Result Value Ref Range   Glucose-Capillary 100 (H) 70 - 99 mg/dL    Comment: Glucose reference range applies only to samples taken after fasting for at least 8 hours.  Glucose, capillary     Status:  Abnormal   Collection Time: 07/28/21  3:23 PM  Result Value Ref Range   Glucose-Capillary 61 (L) 70 - 99 mg/dL    Comment: Glucose reference range applies only to samples taken after fasting for at least 8 hours.  Glucose, capillary     Status: Abnormal   Collection Time: 07/28/21  4:00 PM  Result Value Ref Range   Glucose-Capillary 129 (H) 70 - 99 mg/dL  Comment: Glucose reference range applies only to samples taken after fasting for at least 8 hours.  Glucose, capillary     Status: None   Collection Time: 07/28/21  8:06 PM  Result Value Ref Range   Glucose-Capillary 70 70 - 99 mg/dL    Comment: Glucose reference range applies only to samples taken after fasting for at least 8 hours.   Comment 1 Notify RN    Comment 2 Document in Chart   Glucose, capillary     Status: None   Collection Time: 07/28/21 11:45 PM  Result Value Ref Range   Glucose-Capillary 79 70 - 99 mg/dL    Comment: Glucose reference range applies only to samples taken after fasting for at least 8 hours.   Comment 1 Notify RN    Comment 2 Document in Chart   Glucose, capillary     Status: None   Collection Time: 07/29/21  3:06 AM  Result Value Ref Range   Glucose-Capillary 85 70 - 99 mg/dL    Comment: Glucose reference range applies only to samples taken after fasting for at least 8 hours.   Comment 1 Notify RN    Comment 2 Document in Chart   CBC     Status: Abnormal   Collection Time: 07/29/21  6:20 AM  Result Value Ref Range   WBC 11.5 (H) 4.0 - 10.5 K/uL   RBC 2.56 (L) 3.87 - 5.11 MIL/uL   Hemoglobin 8.1 (L) 12.0 - 15.0 g/dL   HCT 16.1 (L) 09.6 - 04.5 %   MCV 100.4 (H) 80.0 - 100.0 fL   MCH 31.6 26.0 - 34.0 pg   MCHC 31.5 30.0 - 36.0 g/dL   RDW 40.9 (H) 81.1 - 91.4 %   Platelets 265 150 - 400 K/uL   nRBC 0.0 0.0 - 0.2 %    Comment: Performed at [facility], [address] [place], [place] [address]  Basic metabolic panel     Status: Abnormal   Collection Time: 07/29/21  6:20 AM  Result  Value Ref Range   Sodium 138 135 - 145 mmol/L   Potassium 3.8 3.5 - 5.1 mmol/L   Chloride 108 98 - 111 mmol/L   CO2 20 (L) 22 - 32 mmol/L   Glucose, Bld 95 70 - 99 mg/dL    Comment: Glucose reference range applies only to samples taken after fasting for at least 8 hours.   BUN 24 (H) 8 - 23 mg/dL   Creatinine, Ser 6.21 0.44 - 1.00 mg/dL   Calcium 8.5 (L) 8.9 - 10.3 mg/dL   GFR, Estimated >30 >86 mL/min    Comment: (NOTE) Calculated using the CKD-EPI Creatinine Equation (2021)    Anion gap 10 5 - 15    Comment: Performed at [facility], [address], [place], [place] [address]  Glucose, capillary     Status: None   Collection Time: 07/29/21  7:31 AM  Result Value Ref Range   Glucose-Capillary 76 70 - 99 mg/dL    Comment: Glucose reference range applies only to samples taken after fasting for at least 8 hours.    No results found.  Review of Systems  Unable to perform ROS: Psychiatric disorder  Blood pressure (!) 127/108, pulse 93, temperature 97.7 F (36.5 C), temperature source Oral, resp. rate 16, SpO2 100 %. Physical Exam Vitals and nursing note reviewed.  Constitutional:      Appearance: Normal appearance. She is obese.  HENT:  Mouth/Throat:      Comments: Perioral lesions  Pulmonary:     Breath sounds: Wheezing present.  Musculoskeletal:     Comments: Limited and appears painful to movement  Neurological:     General: No focal deficit present.     Mental Status: She is alert.    07/29/21 1446  Presentation  General Appearance Disheveled  Eye Contact Minimal  Speech Slurred;Slow;Garbled  Speech Volume Decreased  Mood and Affect  Mood Anxious;Irritable;Labile  Affect Labile  Thought Processes  Thought Process Irrevelant  Descriptions of Associations Tangential  Orientation Partial (self and location, not month, year, or situation)  Thought Content Rumination;Tangential  Hallucinations None  Ideas of Reference None  Suicidal Thoughts No   Homicidal Thoughts No  Sensorium  Memory Immediate Fair;Recent Poor  Judgment Impaired  Insight Lacking  Executive Functions  Concentration Poor  Attention Span Poor  Recall Poor  Fund of Knowledge Poor  Language Poor  Psychomotor Activity  Psychomotor Activity Restlessness  Assets  Assets Social Support  Sleep  Sleep  (unable to assess)    Assessment/Plan: Kristy [name] is a 75 y.o. 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 Psychiatry was consulted for agitation.  On initial examination, patient was disoriented and tangential. She was ruminating about using the bathroom during the entire interview and though she initially answered some questions eventually would not answer any more because of her need to use the bathroom. She also had some crusting around her mouth that could be angular cheilitis. Since she is on significant antibiotic regiment she could become deficient in multiple vitamins.  We plan to increase her Zyprexa by starting a morning dose, starting melatonin, a multivitamin, and delirium precautions. Recommend continued treatment of her infections.    Recommendations: -Increase Zyprexa to 2.5 mg AM and 5 mg QHS -Start Melatonin 3 mg at 6 PM -Start Multivitamin daily -Start Delirium Precautions   Delirium recs 1. Avoid benzodiazepines, antihistamines, anticholinergics, and minimize opiate use as these may worsen delirium. 2: Assess, prevent and manage pain as lack of treatment can result in delirium.  3: Provide appropriate lighting and clear signage; a clock and calendar should be easily visible to the patient. 4: Monitor environmental  factors. Reduce light and noise at night (close shades, turn off lights, turn off TV, ect). Correct any alterations in sleep cycle. 5: Reorient the patient to person, place, time and situation on each encounter.  6: Correct sensory deficits if possible (replace eye glasses, hearing aids, ect). 7: Avoid restraints if able. Severely delirious patients benefit from constant observation by a sitter.   Continue rest of care per Primary Team Psychiatry will continue to follow the patient   Lauro Franklin 07/29/2021, 10:13 AM

## 2021-07-29 NOTE — Progress Notes (Signed)
PT Cancellation Note  Patient Details Name: Kristy [name] MRN: [id number] DOB: 01-Mar-1946   Cancelled Treatment:    Reason Eval/Treat Not Completed: Other (comment) Checked in am and pt needing to use bed pan - nursing aware.  Checked in afternoon and pt sleeping, attempted to arouse verbally, tactile, lifting HOB, adjusting pt in bed, but pt not overly arousable. She would open eyes but then back to sleep and not following commands.  Will f/u as able.   Anise Salvo, PT Acute Rehab Services Pager [phone] [facility] [phone]  Rayetta Humphrey 07/29/2021, 2:31 PM

## 2021-07-29 NOTE — Progress Notes (Signed)
PROGRESS NOTE    Kristy [name]  QRF:[id number] DOB: [birth year] DOA: 07/20/2021 PCP: Darrow Bussing, MD   Brief Narrative: 10 old female with hypertension, polycythemia, hypertension alcohol use disorder two 12 ounce beer daily, recently admitted and discharged from [facility]  06/10/21-07/11/21 for 2 mechanical falls  and back pain at home-chest x-ray CT head C-spine unremarkable on admission, but had leukocytosis AKI abnormal LFTs urine culture with staph aureus blood culture was positive-and on further work-up found to have cervical epidural abscess C2-T3/4 status post ACDF on 06/16/2021 by  Dr Maurice Small +MSSA and was discharged on IV cefazolin and rifampin through 08/11/2021 and hospital course complicated by acute encephalopathy, dysphagia needing PEG tube, had recurrent urinary retention/UTI-Foley catheter was placed and discharged to SNF.  Readmitted from the nursing facility due to shortness of breath, more confusion than usual baseline .  In the ED:CT head showed acute/subacute possible pontine infarct, CT abdomen pelvis showed dystrophic changes inferior endplate of T12, L1 vertebral body, narrowing of the intervening intervertebral disc and a small amount of soft tissue thickening anteriorly, new finding compared to previous CTs and may represent sequelae of discitis/osteomyelitis, MRI lumbar C-spine was ordered and lab work showed leukocytosis 12.5, CT chest small amount of airspace consolidation versus atelectasis in the bilateral lung bases. ED called [facility] per daughter request but no bed, then patient was admitted MRI brain positive for 1.5 cm? acute to subacute ischemic nonhemorrhagic central pontine infarct. MRI C-spine and L spine done: Referto the report for more detail but shows -vertebral -cervical/lumbar osteomyelitis with epidural abscesses, paraspinal muscle abscesses.  9/11 Neurosurgery, ID, neurology was consulted Neurosurgery did not advise acute surgical intervention/ I&D. Also  seen by speech and placed on pured diet, was n.p.o. plus tube feed previously 9/13:Repeat MRI brain w/ contrast:no intracranial focus of abnormal contrast enhancement. 9/14 ID recommended to continue with cefazolin through 08/25/2021, outpatient ID follow-up, overall treatment failure in the setting of hardware is higher especially in the setting of Staph aureus  Neurology and ID signed off, neurosurgery again saw on  9/15 and signed off.  Subjective: Seen this morning.  Patient is verbally abusive. Alert awake.  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  Seen by ID, neurosurgery-no need for acute surgical intervention, recommended to continue antibiotic through 08/25/2021, outpatient ID follow-up. Blood culture no growth since admission. Per ID overall treatment failure in the setting of hardware is higher especially in the setting of Staph aureus.  Acute Metabolic encephalopathy: On last discharge mental state had cleared some on 9/1 but he still had intermittent confusion was able to follow simple commands- patient following simple commands here, nauseous is agitated and verbally abusive.Suspecting multifactorial in the setting of her persistent spine infection and deconditioning, history of alcohol abuse, now with subacute pontine infarct. S/P High-dose thiamine.Seen by neurology-had extensive work-up. Continue on PTOT, SLP, RD follow-up and supportive care .  Patient hallucinating at times.Psychiatry consulted.    Subacute pontine infarct: nocontrast MRI 1.5 cm 2/2 subacute ischemic likely affecting multiple penetrating arteries due to small vessel disease.MRI brain with contrast repeated  9/12-no intracranial focus of abnormal contrast-enhancement.  Discussed with neurologist. Completed stroke w/u with up with A1c 5.1 , lipid panel  ldl 88, goal < 70,CT angio head and neck no LVO. Echo with EF 60 to 65%, no R WMA, normal mitral valve, aortic valve with mild AR. Cont  ASA 81 mg, Lipitor 40 mg.  Generalized weakness in upper/lower extremities Deconditining Ambulaltory dysfunction Functional quadriplegia: multifactorial due to some cervical myelopathy/infection, pain related, encephalopathy, deconditioning,.continue on PT and OT and supportive care.    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  No need of further antibiotics continue cefazolin. ?  Colonization Continue Foley care, monitor output, monitor renal function closely Recent Labs  Lab 07/25/21 0318 07/26/21 0630 07/27/21 0142 07/28/21 0346 07/29/21 0620  BUN 29* 23 24* 25* 24*  CREATININE 0.84 0.72 0.80 0.78 0.72    Diarrhea/loose stool: Needing rectal tube briefly but resolved after changing Osmolite to  JVT.  Added  probiotics while on antibiotics.   Hypokalemia resolved.  Bilateral lung base consolidation versus atelectasis,  less likely pneumonia-no respiratory symptoms.  Encourage incentive spirometry  Moderate Protein Calorie malnutrition: low albumin. TF feeding changed to Jevity due to diarrhea.  Also taking some orally.  Encourage p.o. intake.  Augment diet   Hypoglycemia- resolved. Cont TF Recent Labs  Lab 07/28/21 1523 07/28/21 1600 07/28/21 2006 07/28/21 2345 07/29/21 0306  GLUCAP 61* 129* 70 79 85     Mildly elevated  bilirubin:Monitor.  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 Rifampin stopped in case it was making the rash worse. Saw dermatology before.Will need dermatology follow up, ? biopsy  as OP.   Hypertension:BP stable. She had been on midodrine PTA, blood pressure on higher side/mild tachycardia-decrease from 5 to 2.5 mg 3 times daily 9/16  Chronic anemia Epistaxis on last admission-needing blood transfusion,by ENT. Stable this admission Recent Labs  Lab 07/25/21 0318 07/26/21 0630 07/27/21 0142 07/28/21 0346 07/29/21 0620  HGB 7.9* 8.3* 7.9* 7.7* 8.1*  HCT 25.1* 25.7* 25.4* 24.5* 25.7*   YBW:LSLHTDSKA full code.Given her infectious process, encephalopathy, co-morbidities palliative care consulted.Patient daughter claims patient was functional prior to last hospital admission and requesting full aggressive care.  PMT involved, seen by multiple consults, attempted transfer to [facility] but was declined.  Plan is for SNF. Attempted transfer EDP called [facility] but no beds so admitted to [facility]. Per daughter request called [facility] transfer line 9/12 evening and was  called by by Dr Collie Siad and NOT accepted for transfer. Diet Order             DIET - DYS 1 Room service appropriate? Yes; Fluid consistency: Thin  Diet effective now                 Patient's There is no height or weight on file to calculate BMI.  DVT prophylaxis: enoxaparin (LOVENOX) injection 40 mg Start: 07/23/21 1545 SCDs Start: 07/21/21 0106. Code Status:   Code Status: Full Code  Family Communication: plan of care discussed with husband and daughter along with ID palliative care at bedside 9/12, multiple calls made since then.   Status JG:OTLXBWIOM Remains inpatient appropriate because:Ongoing diagnostic testing needed not appropriate for outpatient work up, IV treatments appropriate due to intensity  of illness or inability to take PO,  and Inpatient level of care appropriate due to severity of illness  Dispo:The patient is from: SNF [facility]             Anticipated d/c is to:SNF, daughter prefers to go to [place] and not back to [place]. TOC consulted            Patient currently stable.            Difficult to place patient No  Unresulted Labs (From admission, onward)     Start     Ordered   07/28/21 0346  Pathologist smear review  Once,   R        07/28/21 0346   07/27/21 1107  Aerobic Culture w Gram Stain (superficial specimen)  Once,   R        07/27/21 1106          Medications reviewed: Scheduled Meds:  acetaminophen  500 mg Per Tube Q8H   allopurinol  100 mg Per Tube Daily   aspirin  81 mg Per Tube Daily   atorvastatin  40 mg Per Tube QHS   Chlorhexidine Gluconate Cloth  6 each Topical Daily   diclofenac Sodium  4 g Topical TID AC & HS   enoxaparin (LOVENOX) injection  40 mg Subcutaneous Q24H   feeding supplement (PROSource TF)  45 mL Per Tube TID   free water  200 mL Per Tube Q4H   lubriderm seriously sensitive   Topical BID   metoprolol tartrate  25 mg Per Tube BID   OLANZapine  5 mg Per Tube QHS   potassium chloride  20 mEq Per Tube Daily   saccharomyces boulardii  250 mg Per Tube BID   sodium chloride flush  10-40 mL Intracatheter Q12H   thiamine  100 mg Per Tube Q8H   triamcinolone 0.1 % cream : eucerin   Topical BID   Continuous Infusions:   ceFAZolin (ANCEF) IV 2 g (07/29/21 0538)   feeding supplement (JEVITY 1.5 CAL/FIBER) 1,000 mL (07/28/21 1039)   Consultants:see note  Procedures:see note Antimicrobials: Anti-infectives (From admission, onward)    Start     Dose/Rate Route Frequency Ordered Stop   07/24/21 1400  ceFAZolin (ANCEF) IVPB 2g/100 mL premix        2 g 200 mL/hr over 30 Minutes Intravenous Every 8 hours 07/24/21 1150 08/25/21 1359   07/22/21 2200  nafcillin 2 g in sodium chloride 0.9 % 100 mL IVPB  Status:  Discontinued        2  g 200 mL/hr over 30 Minutes Intravenous Every 4 hours 07/22/21 1320 07/24/21 1150   07/22/21 1215  vancomycin (VANCOREADY) IVPB 1250 mg/250 mL  Status:  Discontinued        1,250 mg 166.7 mL/hr over 90 Minutes Intravenous Every 24 hours 07/22/21 1120 07/22/21 1320   07/21/21 1700  rifampin (RIFADIN) 300 mg in sodium chloride 0.9 % 100 mL IVPB  Status:  Discontinued        300 mg 200 mL/hr over 30 Minutes Intravenous Every 12 hours 07/21/21 1327 07/22/21 1014   07/21/21 1600  nafcillin 2 g in sodium chloride 0.9 % 100 mL IVPB  Status:  Discontinued        2 g 200 mL/hr over 30 Minutes Intravenous Every 4 hours 07/21/21 1455 07/22/21 1014   07/21/21 1330  nafcillin injection 2 g  Status:  Discontinued        2 g Intravenous Every 4 hours 07/21/21 1326 07/21/21 1455  07/21/21 0000  vancomycin (VANCOCIN) IVPB 1000 mg/200 mL premix  Status:  Discontinued        1,000 mg 200 mL/hr over 60 Minutes Intravenous Every 12 hours 07/20/21 2156 07/21/21 1326   07/20/21 2200  ceFEPIme (MAXIPIME) 2 g in sodium chloride 0.9 % 100 mL IVPB  Status:  Discontinued        2 g 200 mL/hr over 30 Minutes Intravenous Every 8 hours 07/20/21 2143 07/21/21 1326   07/20/21 2145  vancomycin (VANCOREADY) IVPB 1750 mg/350 mL        1,750 mg 175 mL/hr over 120 Minutes Intravenous  Once 07/20/21 2143 07/21/21 0308      Culture/Microbiology    Component Value Date/Time   SDES URINE, CATHETERIZED 07/22/2021 1737   SPECREQUEST  07/22/2021 1737    NONE Performed at [facility], [address], [place], [place] [address]    CULT (A) 07/22/2021 1737    30,000 COLONIES/mL KLEBSIELLA ORNITHINOLYTICA 40,000 COLONIES/mL YEAST    REPTSTATUS 07/25/2021 FINAL 07/22/2021 1737    Other culture-see note  Objective: Vitals: Today's Vitals   07/28/21 2144 07/28/21 2344 07/29/21 0308 07/29/21 0730  BP: (!) 159/98 116/85 126/78 (!) 127/108  Pulse: (!) 111 88 94 93  Resp:  17 18 16   Temp:  98.6 F (37 C) 98.1 F  (36.7 C) 97.7 F (36.5 C)  TempSrc:  Axillary Oral Oral  SpO2:  100% 100% 100%  PainSc:        Intake/Output Summary (Last 24 hours) at 07/29/2021 0738 Last data filed at 07/29/2021 0411 Gross per 24 hour  Intake 4428 ml  Output 2150 ml  Net 2278 ml    There were no vitals filed for this visit. Weight change:   Intake/Output from previous day: 09/18 0701 - 09/19 0700 In: 4428 [NG/GT:4428] Out: 2150 [Urine:2150] Intake/Output this shift: No intake/output data recorded. There were no vitals filed for this visit.  Examination: General exam:AA, agitated moving extremities HEENT:Oral mucosa moist, Ear/Nose WNL grossly, dentition normal. Respiratory system: bilaterally diminished, no use of accessory muscle Cardiovascular system: S1 & S2 +, No JVD,. Gastrointestinal system: Abdomen soft,PEG+, NT,ND, BS+ Nervous System:Alert, awake, moving extremities and grossly nonfocal Extremities: No edema, distal peripheral pulses palpable.  Skin: Dry skin, no icterus. MSK: Normal muscle bulk,tone, power    Data Reviewed: I have personally reviewed following labs and imaging studies CBC: Recent Labs  Lab 07/25/21 0318 07/26/21 0630 07/27/21 0142 07/28/21 0346 07/29/21 0620  WBC 10.4 12.8* 13.4* 13.3* 11.5*  HGB 7.9* 8.3* 7.9* 7.7* 8.1*  HCT 25.1* 25.7* 25.4* 24.5* 25.7*  MCV 99.2 98.1 99.6 99.2 100.4*  PLT 255 281 284 280 265    Basic Metabolic Panel: Recent Labs  Lab 07/25/21 0318 07/26/21 0630 07/27/21 0142 07/28/21 0346 07/29/21 0620  NA 140 141 140 140 138  K 3.5 3.1* 3.8 3.7 3.8  CL 110 110 110 109 108  CO2 20* 22 20* 21* 20*  GLUCOSE 97 113* 85 87 95  BUN 29* 23 24* 25* 24*  CREATININE 0.84 0.72 0.80 0.78 0.72  CALCIUM 8.4* 8.6* 8.7* 8.4* 8.5*    GFR: Estimated Creatinine Clearance: 66.4 mL/min (by C-G formula based on SCr of 0.72 mg/dL). Liver Function Tests: Recent Labs  Lab 07/23/21 0352 07/24/21 0415 07/28/21 0346  AST 16 16 16   ALT 5 5 5   ALKPHOS  103 128* 122  BILITOT 1.7* 1.9* 1.1  PROT 6.3* 7.0 6.7  ALBUMIN 1.4* 1.6* 1.7*  No results for input(s): LIPASE, AMYLASE in the last 168 hours. No results for input(s): AMMONIA in the last 168 hours. Coagulation Profile: No results for input(s): INR, PROTIME in the last 168 hours. Cardiac Enzymes: No results for input(s): CKTOTAL, CKMB, CKMBINDEX, TROPONINI in the last 168 hours. BNP (last 3 results) No results for input(s): PROBNP in the last 8760 hours. HbA1C: No results for input(s): HGBA1C in the last 72 hours.  CBG: Recent Labs  Lab 07/28/21 1523 07/28/21 1600 07/28/21 2006 07/28/21 2345 07/29/21 0306  GLUCAP 61* 129* 70 79 85    Lipid Profile: No results for input(s): CHOL, HDL, LDLCALC, TRIG, CHOLHDL, LDLDIRECT in the last 72 hours.  Thyroid Function Tests: No results for input(s): TSH, T4TOTAL, FREET4, T3FREE, THYROIDAB in the last 72 hours. Anemia Panel: No results for input(s): VITAMINB12, FOLATE, FERRITIN, TIBC, IRON, RETICCTPCT in the last 72 hours. Sepsis Labs: No results for input(s): PROCALCITON, LATICACIDVEN in the last 168 hours.   Recent Results (from the past 240 hour(s))  Resp Panel by RT-PCR (Flu A&B, Covid) Nasopharyngeal Swab     Status: None   Collection Time: 07/20/21  5:20 PM   Specimen: Nasopharyngeal Swab; Nasopharyngeal(NP) swabs in vial transport medium  Result Value Ref Range Status   SARS Coronavirus 2 by RT PCR NEGATIVE NEGATIVE Final    Comment: (NOTE) SARS-CoV-2 target nucleic acids are NOT DETECTED.  The SARS-CoV-2 RNA is generally detectable in upper respiratory specimens during the acute phase of infection. The lowest concentration of SARS-CoV-2 viral copies this assay can detect is 138 copies/mL. A negative result does not preclude SARS-Cov-2 infection and should not be used as the sole basis for treatment or other patient management decisions. A negative result may occur with  improper specimen collection/handling,  submission of specimen other than nasopharyngeal swab, presence of viral mutation(s) within the areas targeted by this assay, and inadequate number of viral copies(<138 copies/mL). A negative result must be combined with clinical observations, patient history, and epidemiological information. The expected result is Negative.  Fact Sheet for Patients:  BloggerCourse.com  Fact Sheet for Healthcare Providers:  SeriousBroker.it  This test is no t yet approved or cleared by the [place] FDA and  has been authorized for detection and/or diagnosis of SARS-CoV-2 by FDA under an Emergency Use Authorization (EUA). This EUA will remain  in effect (meaning this test can be used) for the duration of the COVID-19 declaration under Section 564(b)(1) of the Act, 21 U.S.C.section 360bbb-3(b)(1), unless the authorization is terminated  or revoked sooner.       Influenza A by PCR NEGATIVE NEGATIVE Final   Influenza B by PCR NEGATIVE NEGATIVE Final    Comment: (NOTE) The Xpert Xpress SARS-CoV-2/FLU/RSV plus assay is intended as an aid in the diagnosis of influenza from Nasopharyngeal swab specimens and should not be used as a sole basis for treatment. Nasal washings and aspirates are unacceptable for Xpert Xpress SARS-CoV-2/FLU/RSV testing.  Fact Sheet for Patients: BloggerCourse.com  Fact Sheet for Healthcare Providers: SeriousBroker.it  This test is not yet approved or cleared by the [place] FDA and has been authorized for detection and/or diagnosis of SARS-CoV-2 by FDA under an Emergency Use Authorization (EUA). This EUA will remain in effect (meaning this test can be used) for the duration of the COVID-19 declaration under Section 564(b)(1) of the Act, 21 U.S.C. section 360bbb-3(b)(1), unless the authorization is terminated or revoked.  Performed at [facility], [address] [place], [place] [address]  Culture, blood (routine x 2)     Status: None   Collection Time: 07/20/21  6:06 PM   Specimen: BLOOD  Result Value Ref Range Status   Specimen Description BLOOD SITE NOT SPECIFIED  Final   Special Requests   Final    BOTTLES DRAWN AEROBIC AND ANAEROBIC Blood Culture adequate volume   Culture   Final    NO GROWTH 5 DAYS Performed at [facility], [address], [place], [place] [address]    Report Status 07/25/2021 FINAL  Final  Culture, blood (routine x 2)     Status: None   Collection Time: 07/20/21  6:09 PM   Specimen: BLOOD  Result Value Ref Range Status   Specimen Description BLOOD SITE NOT SPECIFIED  Final   Special Requests   Final    BOTTLES DRAWN AEROBIC ONLY Blood Culture adequate volume   Culture   Final    NO GROWTH 5 DAYS Performed at [facility], [address] [place], [place] [address]    Report Status 07/25/2021 FINAL  Final  MRSA Next Gen by PCR, Nasal     Status: None   Collection Time: 07/20/21  9:44 PM   Specimen: Nasal Mucosa; Nasal Swab  Result Value Ref Range Status   MRSA by PCR Next Gen NOT DETECTED NOT DETECTED Final    Comment: (NOTE) The GeneXpert MRSA Assay (FDA approved for NASAL specimens only), is one component of a comprehensive MRSA colonization surveillance program. It is not intended to diagnose MRSA infection nor to guide or monitor treatment for MRSA infections. Test performance is not FDA approved in patients less than 25 years old. Performed at [facility], [address], [place], [place] [address]   Urine Culture     Status: Abnormal   Collection Time: 07/22/21  5:37 PM   Specimen: Urine, Catheterized  Result Value Ref Range Status   Specimen Description URINE, CATHETERIZED  Final   Special Requests   Final    NONE Performed at [facility], [address] [place], [place] [address]    Culture (A)  Final    30,000 COLONIES/mL KLEBSIELLA ORNITHINOLYTICA 40,000  COLONIES/mL YEAST    Report Status 07/25/2021 FINAL  Final   Organism ID, Bacteria KLEBSIELLA ORNITHINOLYTICA (A)  Final      Susceptibility   Klebsiella ornithinolytica - MIC*    AMPICILLIN >=32 RESISTANT Resistant     CEFAZOLIN >=64 RESISTANT Resistant     CEFEPIME >=32 RESISTANT Resistant     CEFTRIAXONE >=64 RESISTANT Resistant     CIPROFLOXACIN 1 SENSITIVE Sensitive     GENTAMICIN >=16 RESISTANT Resistant     IMIPENEM <=0.25 SENSITIVE Sensitive     NITROFURANTOIN 64 INTERMEDIATE Intermediate     TRIMETH/SULFA >=320 RESISTANT Resistant     AMPICILLIN/SULBACTAM >=32 RESISTANT Resistant     PIP/TAZO 32 INTERMEDIATE Intermediate     * 30,000 COLONIES/mL KLEBSIELLA ORNITHINOLYTICA      Radiology Studies: No results found.   LOS: 9 days   Lanae Boast, MD [facility]  07/29/2021, 7:38 AM

## 2021-07-29 NOTE — Plan of Care (Signed)
  Problem: Clinical Measurements: Goal: Ability to maintain clinical measurements within normal limits will improve Outcome: Progressing Goal: Will remain free from infection Outcome: Progressing Goal: Diagnostic test results will improve Outcome: Progressing   Problem: Activity: Goal: Risk for activity intolerance will decrease Outcome: Progressing   Problem: Nutrition: Goal: Adequate nutrition will be maintained Outcome: Progressing   Problem: Pain Managment: Goal: General experience of comfort will improve Outcome: Progressing   Problem: Skin Integrity: Goal: Risk for impaired skin integrity will decrease Outcome: Progressing   

## 2021-07-30 LAB — BASIC METABOLIC PANEL
Anion gap: 10 (ref 5–15)
BUN: 32 mg/dL — ABNORMAL HIGH (ref 8–23)
CO2: 20 mmol/L — ABNORMAL LOW (ref 22–32)
Calcium: 8.2 mg/dL — ABNORMAL LOW (ref 8.9–10.3)
Chloride: 107 mmol/L (ref 98–111)
Creatinine, Ser: 0.76 mg/dL (ref 0.44–1.00)
GFR, Estimated: >60 mL/min (ref >60)
Glucose, Bld: 81 mg/dL (ref 70–99)
Potassium: 3.9 mmol/L (ref 3.5–5.1)
Sodium: 137 mmol/L (ref 135–145)

## 2021-07-30 LAB — CBC
HCT: 22.9 % — ABNORMAL LOW (ref 36.0–46.0)
Hemoglobin: 7.2 g/dL — ABNORMAL LOW (ref 12.0–15.0)
MCH: 31.7 pg (ref 26.0–34.0)
MCHC: 31.4 g/dL (ref 30.0–36.0)
MCV: 100.9 fL — ABNORMAL HIGH (ref 80.0–100.0)
Platelets: 231 10*3/uL (ref 150–400)
RBC: 2.27 MIL/uL — ABNORMAL LOW (ref 3.87–5.11)
RDW: 18.4 % — ABNORMAL HIGH (ref 11.5–15.5)
WBC: 10.9 10*3/uL — ABNORMAL HIGH (ref 4.0–10.5)
nRBC: 0 % (ref 0.0–0.2)

## 2021-07-30 LAB — PATHOLOGIST SMEAR REVIEW: Path Review: INCREASED

## 2021-07-30 LAB — GLUCOSE, CAPILLARY
Glucose-Capillary: 78 mg/dL (ref 70–99)
Glucose-Capillary: 103 mg/dL — ABNORMAL HIGH (ref 70–99)
Glucose-Capillary: 65 mg/dL — ABNORMAL LOW (ref 70–99)
Glucose-Capillary: 82 mg/dL (ref 70–99)
Glucose-Capillary: 76 mg/dL (ref 70–99)

## 2021-07-30 MED ORDER — HYDROXYZINE HCL 25 MG PO TABS
12.5000 mg | ORAL_TABLET | Freq: Four times a day (QID) | ORAL | Status: DC | PRN
  Administered 2021-07-30 – 2021-08-05 (×11): 12.5 mg
  Filled 2021-07-30 (×13): qty 1

## 2021-07-30 NOTE — Plan of Care (Signed)
Patient still confused.

## 2021-07-30 NOTE — Progress Notes (Signed)
Speech Language Pathology Treatment: Dysphagia  Patient Details Name: Kristy [name] MRN: [id number] DOB: 1946-06-02 Today's Date: 07/30/2021 Time: 1255-1310 SLP Time Calculation (min) (ACUTE ONLY): 15 min  Assessment / Plan / Recommendation Clinical Impression  Pt continues with minimal motivation to eat; impaired MS has been primary barrier to PO intake. She remains tangential but redirectable.  Was willing to eat a little applesauce and drink some water, but declined after only a few bites/sips then resumed yelling/moaning.  There were no overt concerns for dysphagia/aspiration. Verbal cues were for attention and encouragement.    SLP to f/u briefly to determine ability to tolerate advanced diet.     HPI HPI: Kristy [name]  is a 75 y.o. female, with medical history of essential hypertension, polycythemia, alcohol use disorder who was recently discharged from [facility] on 07/11/2021 after she was found to have cervical epidural abscess C2 to T3/4 s/p ACDF on 06/16/2021  by neurosurgery, cultures are positive for MSSA, patient started on IV cefazolin and rifampin until 08/11/2021, hospital course was complicated by encephalopathy, dysphagia, PEG tube placed, recurrent urinary retention, Foley catheter placed.  As per daughter when she visited patient today she was more short of breath and found her to be more confused than usual.  Patient was brought to the ED for further evaluation.  Initially daughter wanted her to be transferred to [facility] however ED provider called [facility] and was told that she may have to wait for many days , since there was no urgent need for transfer except family request.  In the ED imaging, CT head showed acute/subacute possible pontine infarct.  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  Patient is well known to ST services from previous  admission.  Decreased alertness and mentation appeared to be her biggest risk factors for aspiration.  During that admission she recieved a PEG.      SLP Plan  Continue with current plan of care      Recommendations for follow up therapy are one component of a multi-disciplinary discharge planning process, led by the attending physician.  Recommendations may be updated based on patient status, additional functional criteria and insurance authorization.    Recommendations  Diet recommendations: Thin liquid;Dysphagia 1 (puree) Liquids provided via: Straw;Cup Medication Administration: Via alternative means Supervision: Staff to assist with self feeding Compensations: Minimize environmental distractions                Oral Care Recommendations: Staff/trained caregiver to provide oral care;Oral care BID Follow up Recommendations: Skilled Nursing facility;24 hour supervision/assistance SLP Visit Diagnosis: Dysphagia, unspecified (R13.10) Plan: Continue with current plan of care       GO               Kristy Borin L. Samson Frederic, MA CCC/SLP Acute Rehabilitation Services Office number [phone] Pager [phone]  Kristy [name]  07/30/2021, 1:13 PM

## 2021-07-30 NOTE — Progress Notes (Signed)
Occupational Therapy Treatment Patient Details Name: Kristy [name] MRN: [id number] DOB: [birth year] Today's Date: 07/30/2021   History of present illness 75 y.o. woman who presented to the ED from SNF 9/11 due to increased confusion and SOB. MRI (+) pontine infarct. Concern for Pneumonia and probably CAUTI. 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 Previously admitted to [facility] 8/1 due to a fall and underwent ACDF at C5-6 (06/16/2021) Hospital course complicated by osteomyelitis discitis at C5-T1, epidural abscess from C4-C7, encephalopathy and dysphagia/PEG tube placed, recurrent urinary retention. PMH: HTN, polycthemia, alcohol use.   OT comments  Pt asking someone to get out of her refrigerator on entry to room. Pt apparently delirious however able to easily redirect to task. Pt agreeable to mobilize to EOB and then to recliner. Able to sit EOB at least 15 minutes, occasionally with minguard assist. Demonstrating increase BUE strength and ROM, however continues to be limited more proximally; RUE stronger than left. Able to stand step pivot to chair with MAx A +2 with use of bed pad. Will need to use Maxi move with nsg to mobilize back to bed. Recommend up in chair no longer than 2 hours due to sitting tolerance and concerns over skin integrity. Pressure redistribution pad ordered - placed in room. Recommend Nsg staff mobilize pt OOB daily with use of Maximize and frequently reorient. Pt would benefit from being placed at nursing station when up in chair if allowed. Called and updated daughter [name] on progress. Will continue to follow acutely.   Recommendations for follow up therapy are one component of a multi-disciplinary discharge planning process, led by  the attending physician.  Recommendations may be updated based on patient status, additional functional criteria and insurance authorization.    Follow Up Recommendations  SNF    Equipment Recommendations  None recommended by OT    Recommendations for Other Services      Precautions / Restrictions Precautions Precautions: Fall Precaution Comments: PEG; indwelling foley       Mobility Bed Mobility Overal bed mobility: Needs Assistance Bed Mobility: Rolling;Sidelying to Sit Rolling: Max assist;+2 for physical assistance Sidelying to sit: Max assist       General bed mobility comments: Pt able to initiate movement of BL/UE; able to tuen head to look R/L - L more restricted    Transfers Overall transfer level: Needs assistance   Transfers: Sit to/from Stand;Stand Pivot Transfers Sit to Stand: Max assist;+2 physical assistance Stand pivot transfers: Max assist       General transfer comment: Ability to increase upright posture, however with kyphotic ana forward head position; pt helping with boost up and transition to stand    Balance Overall balance assessment: Needs assistance   Sitting balance-Leahy Scale: Poor Sitting balance - Comments: moments of sitting EOB with minguard A     Standing balance-Leahy Scale: Zero                             ADL either performed or assessed with clinical judgement   ADL Overall ADL's : Needs assistance/impaired     Grooming: Moderate assistance  Functional mobility during ADLs: Maximal assistance General ADL Comments: Able to wash face in R sidelying position; RUE propped up with several pillows to allow pt to wash face and scratch top of head; when sitting EOB able to rub lotion on B thighs; activating lateral weight shifts ot help scoot     Vision       Perception     Praxis      Cognition Arousal/Alertness: Awake/alert Behavior During Therapy:  Restless;Anxious Overall Cognitive Status: Impaired/Different from baseline Area of Impairment: Orientation;Attention;Memory;Following commands;Safety/judgement;Awareness;Problem solving                 Orientation Level: Disoriented to;Place;Time;Situation Current Attention Level: Focused;Sustained Memory: Decreased recall of precautions;Decreased short-term memory Following Commands: Follows one step commands with increased time Safety/Judgement: Decreased awareness of safety;Decreased awareness of deficits Awareness: Intellectual Problem Solving: Slow processing;Decreased initiation;Difficulty sequencing;Requires verbal cues;Requires tactile cues General Comments: Apparently delirious however able to redirect to task easily; enjoys talking with others; repositioned to see out her door, TV and clock. Pt ableto say, I can see the door and the clock and wa able to tell the appropriate time        Exercises General Exercises - Upper Extremity Shoulder Flexion: AAROM;Both;10 reps;Seated Elbow Flexion: AAROM;Both;10 reps;Seated Elbow Extension: AAROM;Both;10 reps Digit Composite Flexion: AROM;AAROM;Both;10 reps   Shoulder Instructions       General Comments itching    Pertinent Vitals/ Pain       Pain Assessment: Faces Faces Pain Scale: Hurts little more Pain Location: neck; BUE with movement - elbows/shoulder Pain Descriptors / Indicators: Discomfort;Grimacing Pain Intervention(s): Limited activity within patient's tolerance;Repositioned (discomfort from itching)  Home Living                                          Prior Functioning/Environment              Frequency  Min 2X/week        Progress Toward Goals  OT Goals(current goals can now be found in the care plan section)  Progress towards OT goals: Progressing toward goals  Acute Rehab OT Goals Patient Stated Goal: To get walking and go home OT Goal Formulation: With  patient/family Time For Goal Achievement: 08/05/21 Potential to Achieve Goals: Fair ADL Goals Pt Will Perform Eating: with mod assist;with adaptive utensils;sitting Pt Will Perform Grooming: with max assist;bed level Pt Will Perform Upper Body Bathing: with max assist;bed level Pt Will Transfer to Toilet: with +2 assist;with mod assist;bedside commode;ambulating Pt/caregiver will Perform Home Exercise Program: Increased ROM;Both right and left upper extremity;With written HEP provided;Independently Additional ADL Goal #1: Pt will tolerate HOB @ 60 x 20 min to increase tolerance for upright sitting Additional ADL Goal #2: Pt will complete hand to mouth pattern with Max A to increase independence with grooming Additional ADL Goal #3: Pt will complete hand to mouth pattern with BUE and Mod A in prep for self-feeding.  Plan Discharge plan remains appropriate;Frequency remains appropriate    Co-evaluation    PT/OT/SLP Co-Evaluation/Treatment: Yes Reason for Co-Treatment: Complexity of the patient's impairments (multi-system involvement);Necessary to address cognition/behavior during functional activity;For patient/therapist safety;To address functional/ADL transfers   OT goals addressed during session: ADL's and self-care;Strengthening/ROM      AM-PAC OT "6 Clicks" Daily Activity     Outcome Measure   Help from another person eating meals?: A Lot Help from another  person taking care of personal grooming?: A Lot Help from another person toileting, which includes using toliet, bedpan, or urinal?: Total Help from another person bathing (including washing, rinsing, drying)?: A Lot Help from another person to put on and taking off regular upper body clothing?: Total Help from another person to put on and taking off regular lower body clothing?: Total 6 Click Score: 9    End of Session Equipment Utilized During Treatment: Gait belt  OT Visit Diagnosis: Unsteadiness on feet (R26.81);Other  abnormalities of gait and mobility (R26.89);Muscle weakness (generalized) (M62.81);History of falling (Z91.81);Other symptoms and signs involving cognitive function;Pain Pain - part of body: Shoulder;Arm (itching)   Activity Tolerance Patient tolerated treatment well   Patient Left in chair;with call bell/phone within reach;with chair alarm set (posey seat belt - quick release)   Nurse Communication Mobility status;Need for lift equipment;Other (comment) (itching)        Time: 1000-1047 OT Time Calculation (min): 47 min  Charges: OT General Charges $OT Visit: 1 Visit OT Treatments $Self Care/Home Management : 8-22 mins $Neuromuscular Re-education: 8-22 mins  Luisa Dago, OT/L   Acute OT Clinical Specialist Acute Rehabilitation Services Pager [phone] Office [phone]   [facility] 07/30/2021, 11:07 AM

## 2021-07-30 NOTE — TOC Progression Note (Signed)
Transition of Care [facility]) - Progression Note    Patient Details  Name: Kristy [name] MRN: [id number] Date of Birth: Aug 20, 1946  [facility] CM/SW Contact  Baldemar Lenis, [place] Phone Number: 07/30/2021, 4:19 PM  Clinical Narrative:   CSW contacted [facility] to review referral, discussed questions that they had. [facility] called back that they are unable to get any tube feedings in stock until after Halloween so they will be unable to accept the patient because of that. CSW called daughter to provide update, and CSW provided other bed offers. Daughter didn't like the other bed offers that the patient had, asked CSW to reach out to [facility] in [place]. CSW to contact [facility] in [place] to see if they would be able to offer a bed for the patient. CSW to follow.    Expected Discharge Plan: Skilled Nursing Facility Barriers to Discharge: Continued Medical Work up, SNF Pending bed offer  Expected Discharge Plan and Services Expected Discharge Plan: Skilled Nursing Facility     Post Acute Care Choice: Skilled Nursing Facility Living arrangements for the past 2 months: Skilled Nursing Facility                                       Social Determinants of Health (SDOH) Interventions    Readmission Risk Interventions No flowsheet data found.

## 2021-07-30 NOTE — Consult Note (Addendum)
Reason for Consult:Verbally abusive, agitated Referring Physician: Lanae Boast, MD  Kristy [name] is an 75 y.o. female.  HPI:   Kristy [name]  is a 75 y.o. female, with medical history of essential hypertension, polycythemia, alcohol use disorder who was recently discharged from [facility] on 07/11/2021 after she was found to have cervical epidural abscess C2 to T3/4 s/p ACDF on 06/16/2021  by neurosurgery, cultures are positive for MSSA, patient started on IV cefazolin and rifampin until 08/11/2021, hospital course was complicated by encephalopathy, dysphagia, PEG tube placed, recurrent urinary retention, Foley catheter placed.  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    Patient seen midmorning while she was sitting in the bedside chair.  Today she was more able to hold conversation.  She reported that she was doing okay but still had significant pain in her neck and arm.  She reported that her sleep was okay.  She reported that she was eating and drinking okay.  She reported no SI, HI, or AVH.  She is alert and oriented to person and place but not month and year (February 1932).  On delirium testing she was able to name the days of the week backwards however was unable to count from 42-27.  She did at one point fall asleep during the assessment but was easily arousable.  She was aware of why she was in the hospital and had an upbeat mood.   History reviewed. No pertinent past medical history.  Past Surgical History:  Procedure Laterality Date   ANTERIOR CERVICAL DECOMP/DISCECTOMY FUSION N/A 06/16/2021   Procedure: ANTERIOR CERVICAL DISCECTOMY FUSION Cervical five -Cervical six, Evacuation of Epidural Abscess;  Surgeon: Jadene Pierini, MD;  Location: [facility] OR;  Service: Neurosurgery;   Laterality: N/A;   IR GASTROSTOMY TUBE MOD SED  07/02/2021    No family history on file.  Social History:  has no history on file for tobacco use, alcohol use, and drug use.  Allergies: No Known Allergies  Medications: I have reviewed the patient's current medications.  Results for orders placed or performed during the hospital encounter of 07/20/21 (from the past 48 hour(s))  Glucose, capillary     Status: Abnormal   Collection Time: 07/28/21 11:23 AM  Result Value Ref Range   Glucose-Capillary 100 (H) 70 - 99 mg/dL    Comment: Glucose reference range applies only to samples taken after fasting for at least 8 hours.  Glucose, capillary     Status: Abnormal   Collection Time: 07/28/21  3:23 PM  Result Value Ref Range   Glucose-Capillary 61 (L) 70 - 99 mg/dL    Comment: Glucose reference range applies only to samples taken after fasting for at least 8 hours.  Glucose, capillary     Status: Abnormal   Collection Time: 07/28/21  4:00 PM  Result Value Ref Range   Glucose-Capillary 129 (H) 70 - 99 mg/dL    Comment: Glucose reference range applies only to samples taken after fasting for at least 8 hours.  Glucose, capillary     Status: None   Collection Time: 07/28/21  8:06 PM  Result Value Ref Range   Glucose-Capillary 70 70 - 99 mg/dL    Comment: Glucose reference range applies only to samples taken after fasting for at least 8 hours.   Comment 1 Notify RN  Comment 2 Document in Chart   Glucose, capillary     Status: None   Collection Time: 07/28/21 11:45 PM  Result Value Ref Range   Glucose-Capillary 79 70 - 99 mg/dL    Comment: Glucose reference range applies only to samples taken after fasting for at least 8 hours.   Comment 1 Notify RN    Comment 2 Document in Chart   Glucose, capillary     Status: None   Collection Time: 07/29/21  3:06 AM  Result Value Ref Range   Glucose-Capillary 85 70 - 99 mg/dL    Comment: Glucose reference range applies only to samples taken after  fasting for at least 8 hours.   Comment 1 Notify RN    Comment 2 Document in Chart   CBC     Status: Abnormal   Collection Time: 07/29/21  6:20 AM  Result Value Ref Range   WBC 11.5 (H) 4.0 - 10.5 K/uL   RBC 2.56 (L) 3.87 - 5.11 MIL/uL   Hemoglobin 8.1 (L) 12.0 - 15.0 g/dL   HCT 09.3 (L) 23.5 - 57.3 %   MCV 100.4 (H) 80.0 - 100.0 fL   MCH 31.6 26.0 - 34.0 pg   MCHC 31.5 30.0 - 36.0 g/dL   RDW 22.0 (H) 25.4 - 27.0 %   Platelets 265 150 - 400 K/uL   nRBC 0.0 0.0 - 0.2 %    Comment: Performed at [facility], [address] [place], [place] [address]  Basic metabolic panel     Status: Abnormal   Collection Time: 07/29/21  6:20 AM  Result Value Ref Range   Sodium 138 135 - 145 mmol/L   Potassium 3.8 3.5 - 5.1 mmol/L   Chloride 108 98 - 111 mmol/L   CO2 20 (L) 22 - 32 mmol/L   Glucose, Bld 95 70 - 99 mg/dL    Comment: Glucose reference range applies only to samples taken after fasting for at least 8 hours.   BUN 24 (H) 8 - 23 mg/dL   Creatinine, Ser 2.83 0.44 - 1.00 mg/dL   Calcium 8.5 (L) 8.9 - 10.3 mg/dL   GFR, Estimated >15 >17 mL/min    Comment: (NOTE) Calculated using the CKD-EPI Creatinine Equation (2021)    Anion gap 10 5 - 15    Comment: Performed at [facility], [address], [place], [place] [address]  Glucose, capillary     Status: None   Collection Time: 07/29/21  7:31 AM  Result Value Ref Range   Glucose-Capillary 76 70 - 99 mg/dL    Comment: Glucose reference range applies only to samples taken after fasting for at least 8 hours.  Glucose, capillary     Status: None   Collection Time: 07/29/21 12:25 PM  Result Value Ref Range   Glucose-Capillary 87 70 - 99 mg/dL    Comment: Glucose reference range applies only to samples taken after fasting for at least 8 hours.  Glucose, capillary     Status: Abnormal   Collection Time: 07/29/21  3:36 PM  Result Value Ref Range   Glucose-Capillary 109 (H) 70 - 99 mg/dL    Comment: Glucose reference range  applies only to samples taken after fasting for at least 8 hours.  Glucose, capillary     Status: None   Collection Time: 07/29/21  7:58 PM  Result Value Ref Range   Glucose-Capillary 85 70 - 99 mg/dL    Comment: Glucose reference range applies  only to samples taken after fasting for at least 8 hours.   Comment 1 Notify RN    Comment 2 Document in Chart   Glucose, capillary     Status: None   Collection Time: 07/29/21 11:31 PM  Result Value Ref Range   Glucose-Capillary 92 70 - 99 mg/dL    Comment: Glucose reference range applies only to samples taken after fasting for at least 8 hours.   Comment 1 Notify RN    Comment 2 Document in Chart   Basic metabolic panel     Status: Abnormal   Collection Time: 07/30/21  2:57 AM  Result Value Ref Range   Sodium 137 135 - 145 mmol/L   Potassium 3.9 3.5 - 5.1 mmol/L   Chloride 107 98 - 111 mmol/L   CO2 20 (L) 22 - 32 mmol/L   Glucose, Bld 81 70 - 99 mg/dL    Comment: Glucose reference range applies only to samples taken after fasting for at least 8 hours.   BUN 32 (H) 8 - 23 mg/dL   Creatinine, Ser 0.25 0.44 - 1.00 mg/dL   Calcium 8.2 (L) 8.9 - 10.3 mg/dL   GFR, Estimated >85 >27 mL/min    Comment: (NOTE) Calculated using the CKD-EPI Creatinine Equation (2021)    Anion gap 10 5 - 15    Comment: Performed at [facility], [address] [place], [place] [address]  CBC     Status: Abnormal   Collection Time: 07/30/21  2:57 AM  Result Value Ref Range   WBC 10.9 (H) 4.0 - 10.5 K/uL   RBC 2.27 (L) 3.87 - 5.11 MIL/uL   Hemoglobin 7.2 (L) 12.0 - 15.0 g/dL   HCT 35.3 (L) 61.4 - 43.1 %   MCV 100.9 (H) 80.0 - 100.0 fL   MCH 31.7 26.0 - 34.0 pg   MCHC 31.4 30.0 - 36.0 g/dL   RDW 54.0 (H) 08.6 - 76.1 %   Platelets 231 150 - 400 K/uL   nRBC 0.0 0.0 - 0.2 %    Comment: Performed at [facility], [address], [place], [place] [address]  Glucose, capillary     Status: None   Collection Time: 07/30/21  3:13 AM  Result Value Ref  Range   Glucose-Capillary 78 70 - 99 mg/dL    Comment: Glucose reference range applies only to samples taken after fasting for at least 8 hours.   Comment 1 Notify RN    Comment 2 Document in Chart   Glucose, capillary     Status: Abnormal   Collection Time: 07/30/21  7:22 AM  Result Value Ref Range   Glucose-Capillary 103 (H) 70 - 99 mg/dL    Comment: Glucose reference range applies only to samples taken after fasting for at least 8 hours.    No results found.  Review of Systems  Respiratory:  Negative for chest tightness and shortness of breath.   Cardiovascular:  Negative for chest pain.  Gastrointestinal:  Negative for abdominal pain, constipation, diarrhea, nausea and vomiting.  Neurological:  Negative for headaches.  Psychiatric/Behavioral:  Positive for confusion (mild). Negative for agitation, hallucinations, sleep disturbance and suicidal ideas. The patient is not nervous/anxious.   Blood pressure 109/69, pulse 82, temperature 97.8 F (36.6 C), temperature source Axillary, resp. rate 18, SpO2 100 %. Physical Exam Vitals and nursing note reviewed.  Constitutional:      General: She is not in acute distress.    Appearance: Normal appearance.  She is obese. She is not ill-appearing or toxic-appearing.  HENT:     Head: Normocephalic and atraumatic.     Mouth/Throat:      Comments: Perioral lesions concerning for Angular Cheilitis Pulmonary:     Effort: Pulmonary effort is normal.  Neurological:     Mental Status: She is alert.      07/30/21 1502  Presentation  General Appearance Appropriate for Environment;Casual  Eye Contact Fair  Speech Clear and Coherent;Slow  Speech Volume Normal  Mood and Affect  Mood Euthymic  Affect Congruent;Appropriate  Thought Processes  Thought Process Coherent  Descriptions of Associations Intact  Orientation Partial (Oriented to person, place, and situation not month or year.)  Thought Content Logical;WDL  Hallucinations None   Ideas of Reference None  Suicidal Thoughts No  Homicidal Thoughts No  Sensorium  Memory Immediate Fair;Recent Fair  Judgment Poor  Insight Poor  Executive Functions  Concentration Poor (but with good effort)  Attention Span Poor (but with good effort)  Recall Jennelle Human of Knowledge Fair  Language Fair  Psychomotor Activity  Psychomotor Activity Normal  Assets  Assets Resilience;Social Support  Sleep  Sleep Fair     Assessment/Plan: Kristy [name] is a 75 y.o. 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 Psychiatry was consulted for agitation.  On initial examination, patient was disoriented and tangential. She was ruminating about using the bathroom during the entire interview and though she initially answered some questions eventually would not answer any more because of her need to use the bathroom. She also had some crusting around her mouth that could be angular cheilitis.     Today patient is much improved from yesterday.  She still has some signs of delirium but is much improved.  Our recommendations for today are to make no medication changes, continue with delirium precautions, and continue to treat her infections.  We have discussed these recommendations with primary team.    Recommendations: -Continue Zyprexa to 2.5 mg AM and 5 mg QHS -Continue Melatonin 3 mg at 6 PM -Continue Multivitamin daily -Continue Delirium Precautions   Delirium recs 1. Avoid benzodiazepines, antihistamines, anticholinergics, and minimize opiate use as these may worsen delirium. 2: Assess, prevent and manage pain as lack of treatment can result in delirium.   3: Provide appropriate lighting and clear signage; a clock and calendar should be easily visible to the patient. 4: Monitor environmental factors. Reduce light and noise at night (close shades, turn off lights, turn off TV, ect). Correct any alterations in sleep cycle. 5: Reorient the patient to person, place, time and situation on each encounter.  6: Correct sensory deficits if possible (replace eye glasses, hearing aids, ect). 7: Avoid restraints if able. Severely delirious patients benefit from constant observation by a sitter.   Continue rest of care per Primary Team Psychiatry will continue to follow the patient   Lauro Franklin 07/30/2021, 8:20 AM

## 2021-07-30 NOTE — Progress Notes (Signed)
Physical Therapy Treatment Patient Details Name: Kristy [name] MRN: [id number] DOB: Dec 30, 1945 Today's Date: 07/30/2021   History of Present Illness 75 y.o. woman who presented to the ED from SNF 9[facility] due to increased confusion and SOB. MRI (+) pontine infarct. Concern for Pneumonia and probably CAUTI. 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 PMH: HTN, polycthemia, alcohol use.    PT Comments    Pt received in supine, delirious initially but cooperative with encouragement. Pt frequently perseverating on symptoms of itching/discomfort but able to be redirected to perform bed mobility and transfer training at bedside. Pt continuing to perform transfers with +2 maxA but able to stand for longer period of time this dated and performed pivot to recliner with heavy +2 assist. Pt continues to demonstrate R lean/cervical lateral flexion at rest and positioned in chair to encourage midline posture. Geomat cushion obtained for pt but did not arrive prior to her getting up to chair so placed in room for next session. Pt continues to benefit from PT services to progress toward functional mobility goals.    Recommendations for follow up therapy are one component of a multi-disciplinary discharge planning process, led by the attending physician.  Recommendations may be updated based on patient status, additional functional criteria and insurance authorization.  Follow Up Recommendations  SNF;Supervision/Assistance - 24  hour     Equipment Recommendations  Other (comment) (TBA)    Recommendations for Other Services       Precautions / Restrictions Precautions Precautions: Fall Precaution Comments: PEG; indwelling foley Restrictions Weight Bearing Restrictions: No     Mobility  Bed Mobility Overal bed mobility: Needs Assistance Bed Mobility: Rolling;Sidelying to Sit Rolling: Max assist Sidelying to sit: Max assist;+2 for physical assistance       General bed mobility comments: Pt able to initiate movement of BL/UE; able to tuen head to look R/L - L more restricted;    Transfers Overall transfer level: Needs assistance   Transfers: Sit to/from [employer] Transfers Sit to Stand: Max assist;+2 physical assistance;From elevated surface Stand pivot transfers: Max assist;+2 physical assistance;+2 safety/equipment       General transfer comment: Ability to increase upright posture, however with kyphotic ana forward head position; pt helping with boost up and transition to stand, using B HHA, feet blocked and using transfer pad when pivoting to chair with +2 at hips/UE; minimal foot clearance  Ambulation/Gait             General Gait Details: pt unable to significantly deweight BLE for stepping, pivot only to chair   Stairs             Wheelchair Mobility    Modified Rankin (Stroke Patients Only)       Balance Overall balance assessment: Needs assistance Sitting-balance support: Single extremity supported;Bilateral upper extremity supported;Feet supported Sitting balance-Leahy Scale: Poor Sitting balance - Comments: moments of sitting EOB with minguard A, initially modA   Standing balance support: Bilateral upper extremity supported Standing balance-Leahy Scale: Zero Standing balance comment: +2 maxA with chuck pad and B HHA; unable to  weight shift                            Cognition Arousal/Alertness: Awake/alert Behavior During Therapy:  Restless;Anxious Overall Cognitive Status: Impaired/Different from baseline Area of Impairment: Orientation;Attention;Memory;Following commands;Safety/judgement;Awareness;Problem solving                 Orientation Level: Disoriented to;Place;Time;Situation Current Attention Level: Focused;Sustained Memory: Decreased recall of precautions;Decreased short-term memory Following Commands: Follows one step commands with increased time Safety/Judgement: Decreased awareness of safety;Decreased awareness of deficits Awareness: Intellectual Problem Solving: Slow processing;Decreased initiation;Difficulty sequencing;Requires verbal cues;Requires tactile cues General Comments: Pt apparently delirious and needs frequent redirection to task (perseverating on feeling itchy throughout session in various areas of body but especially her back), but participatory with encouragement; Often closing her eyes and needs reminders for eyes open.      Exercises General Exercises - Upper Extremity Shoulder Flexion: AAROM;Both;10 reps;Seated Elbow Flexion: AAROM;Both;10 reps;Seated Elbow Extension: AAROM;Both;10 reps Digit Composite Flexion: AROM;AAROM;Both;10 reps General Exercises - Lower Extremity Hip Flexion/Marching: AROM;10 reps;Seated    General Comments General comments (skin integrity, edema, etc.): pt with noted rash on neck/back and dry skin; constantly c/o feeling itchy      Pertinent Vitals/Pain Pain Assessment: Faces Faces Pain Scale: Hurts little more Pain Location: neck; BUE with movement - elbows/shoulder Pain Descriptors / Indicators: Discomfort;Grimacing Pain Intervention(s): Limited activity within patient's tolerance;Monitored during session;Repositioned    Home Living                      Prior Function            PT Goals (current goals can now be found in the care plan section) Acute Rehab PT Goals Patient Stated Goal: To get walking and go home PT Goal  Formulation: Patient unable to participate in goal setting Time For Goal Achievement: 08/05/21 Progress towards PT goals: Progressing toward goals    Frequency    Min 2X/week      PT Plan Current plan remains appropriate    Co-evaluation PT/OT/SLP Co-Evaluation/Treatment: Yes Reason for Co-Treatment: Complexity of the patient's impairments (multi-system involvement);Necessary to address cognition/behavior during functional activity;For patient/therapist safety;To address functional/ADL transfers PT goals addressed during session: Mobility/safety with mobility;Balance;Strengthening/ROM OT goals addressed during session: ADL's and self-care;Strengthening/ROM      AM-PAC PT "6 Clicks" Mobility   Outcome Measure  Help needed turning from your back to your side while in a flat bed without using bedrails?: A Lot Help needed moving from lying on your back to sitting on the side of a flat bed without using bedrails?: Total Help needed moving to and from a bed to a chair (including a wheelchair)?: Total Help needed standing up from a chair using your arms (e.g., wheelchair or bedside chair)?: Total Help needed to walk in hospital room?: Total Help needed climbing 3-5 steps with a railing? : Total 6 Click Score: 7    End of Session Equipment Utilized During Treatment: Other (comment);Gait belt (transfer pad and B HHA for safety) Activity Tolerance: Patient tolerated treatment well Patient left: in chair;with call bell/phone within reach;with chair alarm set;Other (comment) (lap belt alarm set, cushions x3 under RUE to prevent leaning to R) Nurse Communication: Mobility status;Need for lift equipment;Other (comment) (not to sit up >2 hours) PT Visit Diagnosis: Other abnormalities of gait and mobility (R26.89);Muscle weakness (generalized) (M62.81);Pain;History of falling (Z91.81);Adult, failure to thrive (R62.7) Pain - part of body:  (generalized  UE and back)     Time: 2353-6144 PT  Time Calculation (min) (ACUTE ONLY): 33 min  Charges:  $Therapeutic Activity: 8-22 mins                     Yarenis Cerino P., PTA Acute Rehabilitation Services Pager: [phone] Office: [phone]    Angus Palms 07/30/2021, 11:57 AM

## 2021-07-30 NOTE — Plan of Care (Signed)
  Problem: Skin Integrity: Goal: Risk for impaired skin integrity will decrease Outcome: Not Progressing  Patient has chronic dermitis and scratches continuously

## 2021-07-30 NOTE — Progress Notes (Signed)
PROGRESS NOTE    Kristy [name]  KYH:[id number] DOB: [birth year] DOA: 07/20/2021 PCP: Darrow Bussing, MD   Brief Narrative: 75 old female with hypertension, polycythemia, hypertension alcohol use disorder two 12 ounce beer daily, recently admitted and discharged from [facility]  06/10/21-07/11/21 for 2 mechanical falls  and back pain at home-chest x-ray CT head C-spine unremarkable on admission, but had leukocytosis AKI abnormal LFTs urine culture with staph aureus blood culture was positive-and on further work-up found to have cervical epidural abscess C2-T3/4 status post ACDF on 06/16/2021 by  Dr Maurice Small +MSSA and was discharged on IV cefazolin and rifampin through 08/11/2021 and hospital course complicated by acute encephalopathy, dysphagia needing PEG tube, had recurrent urinary retention/UTI-Foley catheter was placed and discharged to SNF.  Readmitted from the nursing facility due to shortness of breath, more confusion than usual baseline .  In the ED:CT head showed acute/subacute possible pontine infarct, CT abdomen pelvis showed dystrophic changes inferior endplate of T12, L1 vertebral body, narrowing of the intervening intervertebral disc and a small amount of soft tissue thickening anteriorly, new finding compared to previous CTs and may represent sequelae of discitis/osteomyelitis, MRI lumbar C-spine was ordered and lab work showed leukocytosis 12.5, CT chest small amount of airspace consolidation versus atelectasis in the bilateral lung bases. ED called [facility] per daughter request but no bed, then patient was admitted MRI brain positive for 1.5 cm? acute to subacute ischemic nonhemorrhagic central pontine infarct. MRI C-spine and L spine done: Referto the report for more detail but shows -vertebral -cervical/lumbar osteomyelitis with epidural abscesses, paraspinal muscle abscesses.  9/11 Neurosurgery, ID, neurology was consulted Neurosurgery did not advise acute surgical intervention/ I&D. Also  seen by speech and placed on pured diet, was n.p.o. plus tube feed previously 9/13:Repeat MRI brain w/ contrast:no intracranial focus of abnormal contrast enhancement. 9/14 ID recommended to continue with cefazolin through 08/25/2021, outpatient ID follow-up, overall treatment failure in the setting of hardware is higher especially in the setting of Staph aureus  Neurology and ID signed off, neurosurgery again saw on  9/15 and signed off. 9/19-psychiatry consulted  Subjective:  Seen and examined. Patient is alert awake complains of itching. Able to tell me that she is in the hospital, knew the current president, however due to birth.  Denies any double vision.  She is able to move her legs and arms although grossly weak in all 4. 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  Seen by ID, neurosurgery-no need for acute surgical intervention, recommended to continue antibiotic through 08/25/2021, outpatient ID follow-up. Blood culture no growth since admission. Per ID overall treatment failure in the setting of hardware is higher especially in the setting of Staph aureus. Leukocytosis is stable/improving.  She is afebrile.  PICC line in place.  Acute Metabolic encephalopathy: On last discharge mental state had cleared some on 9/1 but he still had intermittent confusion was able to follow simple commands- patient following simple commands here, nauseous is agitated and verbally abusive.Suspecting multifactorial in the setting of her persistent spine infection and deconditioning, history of alcohol abuse, now with subacute pontine infarct. S/P High-dose  thiamine.Seen by neurology-had extensive work-up. Continue on PTOT, SLP, RD follow-up and supportive care .  Psychiatry consulted  9/19 appreciate input, added Zyprexa 2.5 mg a.m. and continue nightly depression, mentation appears better today. Will minimize benzodiazepine Ativan antihistaminic and sedatives as much as possible.   Subacute pontine infarct: nocontrast MRI 1.5 cm 2/2 subacute ischemic likely affecting multiple penetrating arteries due to small vessel disease.MRI brain with contrast repeated 9/12-no intracranial focus of abnormal contrast-enhancement.  Discussed with neurologist. Completed stroke w/u with up with A1c 5.1 , lipid panel  ldl 88, goal < 70,CT angio head and neck no LVO. Echo with EF 60 to 65%, no R WMA, normal mitral valve, aortic valve with mild AR. Cont  ASA 81 mg, Lipitor 40 mg.  Generalized weakness in upper/lower extremities Deconditining Ambulaltory dysfunction Functional quadriplegia: multifactorial due to some cervical myelopathy/infection, pain related, encephalopathy, deconditioning,cont PT/OT, supportive care.    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  No need of further antibiotics continue cefazolin. ?  Colonization Continue Foley care, continue Flomax monitor urine output and renal function intermittently Recent Labs  Lab 07/26/21 0630 07/27/21 0142 07/28/21 0346 07/29/21 0620 07/30/21 0257  BUN 23 24* 25* 24* 32*  CREATININE 0.72 0.80 0.78 0.72 0.76    Diarrhea/loose stool: Needing rectal tube briefly but resolved after changing Osmolite to  JVT.  Continue probiotics while on antibiotics.   Hypokalemia resolved.  Bilateral lung base consolidation versus atelectasis,  less likely pneumonia-no respiratory symptoms.  Encourage incentive spirometry  Moderate  Protein Calorie malnutrition: low albumin. TF feeding changed to Jevity due to diarrhea.  Also taking some orally.  Encourage p.o. intake.  Augment diet   Hypoglycemia- resolved. Cont TF Recent Labs  Lab 07/29/21 1536 07/29/21 1958 07/29/21 2331 07/30/21 0313 07/30/21 0722  GLUCAP 109* 85 92 78 103*     Mildly elevated bilirubin:Monitor.  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  We will cut down dose of atarax as it  may be worsening her mental status , On prn Benadryl, skin cream.She has had this skin condition for few years. Rifampin stopped in case it was making the rash worse. Saw dermatology before.Will need dermatology follow up, ? biopsy  as OP.   Hypertension:BP stable.  Was on midodrine prior to admission, discontinued and tolerating well   Chronic anemia Epistaxis on last admission-needing blood transfusion Monitor transfuse if less than 7 g. Recent Labs  Lab 07/26/21 0630 07/27/21 0142 07/28/21 0346 07/29/21 0620 07/30/21 0257  HGB 8.3* 7.9* 7.7* 8.1* 7.2*  HCT 25.7* 25.4* 24.5* 25.7* 22.9*   RUE:[id number] full code.Given her infectious process, encephalopathy, co-morbidities palliative care consulted.Patient daughter claims patient was functional prior to last hospital admission and requesting full aggressive care.  PMT involved, seen by multiple consults, attempted transfer to [facility] but was declined.  Plan is for SNF. Attempted transfer EDP called [facility] but no beds so admitted to [facility]. Per daughter request called [facility] transfer line 9/12 evening and was  called by by Dr Collie Siad and NOT accepted for transfer. Diet Order             DIET - DYS 1 Room service appropriate? Yes; Fluid consistency: Thin  Diet effective now                 Patient's There is no height or weight on file to calculate BMI.  DVT prophylaxis: enoxaparin (LOVENOX) injection 40 mg Start: 07/23/21 1545 SCDs  Start:  07/21/21 0106. Code Status:   Code Status: Full Code  Family Communication: plan of care discussed with husband and daughter along with ID palliative care at bedside 9/12, multiple calls made since then.  TOC invilved and communicating with daughter. She is aware abt plan for SNF and cont on iv antibiotics, ptot supportive care.  Status RX:[id number] Remains inpatient appropriate because:Ongoing diagnostic testing needed not appropriate for outpatient work up, IV treatments appropriate due to intensity of illness or inability to take PO, and Inpatient level of care appropriate due to severity of illness  Dispo:The patient is from: SNF [facility]             Anticipated d/c is to:SNF, daughter prefers to go to [place] and not back to [place]. TOC consulted-pending bed offers            Patient currently stable.            Difficult to place patient No  Unresulted Labs (From admission, onward)     Start     Ordered   07/28/21 0346  Pathologist smear review  Once,   R        07/28/21 0346          Medications reviewed: Scheduled Meds:  acetaminophen  500 mg Per Tube Q8H   allopurinol  100 mg Per Tube Daily   aspirin  81 mg Per Tube Daily   atorvastatin  40 mg Per Tube QHS   Chlorhexidine Gluconate Cloth  6 each Topical Daily   diclofenac Sodium  4 g Topical TID AC & HS   enoxaparin (LOVENOX) injection  40 mg Subcutaneous Q24H   feeding supplement (PROSource TF)  45 mL Per Tube TID   free water  200 mL Per Tube Q4H   geriatric multivitamins-minerals  15 mL Oral Daily   lubriderm seriously sensitive   Topical BID   melatonin  3 mg Oral QHS   metoprolol tartrate  25 mg Per Tube BID   OLANZapine  2.5 mg Per Tube Daily   OLANZapine  5 mg Per Tube QHS   potassium chloride  20 mEq Per Tube Daily   saccharomyces boulardii  250 mg Per Tube BID   sodium chloride flush  10-40 mL Intracatheter Q12H   thiamine  100 mg Per Tube Q8H   triamcinolone 0.1 % cream : eucerin   Topical BID    Continuous Infusions:   ceFAZolin (ANCEF) IV 2 g (07/30/21 0534)   feeding supplement (JEVITY 1.5 CAL/FIBER) 1,000 mL (07/30/21 0537)   Consultants:see note  Procedures:see note Antimicrobials: Anti-infectives (From admission, onward)    Start     Dose/Rate Route Frequency Ordered Stop   07/24/21 1400  ceFAZolin (ANCEF) IVPB 2g/100 mL premix        2 g 200 mL/hr over 30 Minutes Intravenous Every 8 hours 07/24/21 1150 08/25/21 1359   07/22/21 2200  nafcillin 2 g in sodium chloride 0.9 % 100 mL IVPB  Status:  Discontinued        2 g 200 mL/hr over 30 Minutes Intravenous Every 4 hours 07/22/21 1320 07/24/21 1150   07/22/21 1215  vancomycin (VANCOREADY) IVPB 1250 mg/250 mL  Status:  Discontinued        1,250 mg 166.7 mL/hr over 90 Minutes Intravenous Every 24 hours 07/22/21 1120 07/22/21 1320   07/21/21 1700  rifampin (RIFADIN) 300 mg in sodium chloride 0.9 % 100 mL IVPB  Status:  Discontinued  300 mg 200 mL/hr over 30 Minutes Intravenous Every 12 hours 07/21/21 1327 07/22/21 1014   07/21/21 1600  nafcillin 2 g in sodium chloride 0.9 % 100 mL IVPB  Status:  Discontinued        2 g 200 mL/hr over 30 Minutes Intravenous Every 4 hours 07/21/21 1455 07/22/21 1014   07/21/21 1330  nafcillin injection 2 g  Status:  Discontinued        2 g Intravenous Every 4 hours 07/21/21 1326 07/21/21 1455   07/21/21 0000  vancomycin (VANCOCIN) IVPB 1000 mg/200 mL premix  Status:  Discontinued        1,000 mg 200 mL/hr over 60 Minutes Intravenous Every 12 hours 07/20/21 2156 07/21/21 1326   07/20/21 2200  ceFEPIme (MAXIPIME) 2 g in sodium chloride 0.9 % 100 mL IVPB  Status:  Discontinued        2 g 200 mL/hr over 30 Minutes Intravenous Every 8 hours 07/20/21 2143 07/21/21 1326   07/20/21 2145  vancomycin (VANCOREADY) IVPB 1750 mg/350 mL        1,750 mg 175 mL/hr over 120 Minutes Intravenous  Once 07/20/21 2143 07/21/21 0308      Culture/Microbiology    Component Value Date/Time   SDES  URINE, CATHETERIZED 07/22/2021 1737   SPECREQUEST  07/22/2021 1737    NONE Performed at [facility], [address] [place], [place] [address]    CULT (A) 07/22/2021 1737    30,000 COLONIES/mL KLEBSIELLA ORNITHINOLYTICA 40,000 COLONIES/mL YEAST    REPTSTATUS 07/25/2021 FINAL 07/22/2021 1737    Other culture-see note  Objective: Vitals: Today's Vitals   07/29/21 2353 07/30/21 0314 07/30/21 0400 07/30/21 0717  BP:  (!) 117/59  109/69  Pulse:  80  82  Resp:  17  18  Temp:  97.8 F (36.6 C)  97.8 F (36.6 C)  TempSrc:  Oral  Axillary  SpO2:  99%  100%  PainSc: Asleep  Asleep     Intake/Output Summary (Last 24 hours) at 07/30/2021 1103 Last data filed at 07/30/2021 5643 Gross per 24 hour  Intake --  Output 800 ml  Net -800 ml    There were no vitals filed for this visit. Weight change:   Intake/Output from previous day: 09/19 0701 - 09/20 0700 In: 0  Out: 800 [Urine:800] Intake/Output this shift: Total I/O In: -  Out: 800 [Urine:800] There were no vitals filed for this visit.  Examination:  General exam: AAOx place,current president and her date of birth. She is turned towards right HEENT:Oral mucosa moist,Ear/Nose WNL grossly, dentition normal. Respiratory system:Bilaterally clear, no use of accessory muscle. Cardiovascular system:S1 & S2 +,No JVD. Gastrointestinal system:Abdomen soft, PEG+,NT,ND, BS+. Nervous System:Alert, awake,moving extremities and grossly non-focal. Extremities: No edema, distal peripheral pulses palpable.  Skin:Skin dry, scratch marks+,no icterus. PIR:JJOACZ muscle bulk,tone, power.   Data Reviewed: I have personally reviewed following labs and imaging studies CBC: Recent Labs  Lab 07/26/21 0630 07/27/21 0142 07/28/21 0346 07/29/21 0620 07/30/21 0257  WBC 12.8* 13.4* 13.3* 11.5* 10.9*  HGB 8.3* 7.9* 7.7* 8.1* 7.2*  HCT 25.7* 25.4* 24.5* 25.7* 22.9*  MCV 98.1 99.6 99.2 100.4* 100.9*  PLT 281 284 280 265 231    Basic  Metabolic Panel: Recent Labs  Lab 07/26/21 0630 07/27/21 0142 07/28/21 0346 07/29/21 0620 07/30/21 0257  NA 141 140 140 138 137  K 3.1* 3.8 3.7 3.8 3.9  CL 110 110 109 108 107  CO2 22 20* 21* 20* 20*  GLUCOSE 113* 85 87 95 81  BUN 23 24* 25* 24* 32*  CREATININE 0.72 0.80 0.78 0.72 0.76  CALCIUM 8.6* 8.7* 8.4* 8.5* 8.2*    GFR: Estimated Creatinine Clearance: 66.4 mL/min (by C-G formula based on SCr of 0.76 mg/dL). Liver Function Tests: Recent Labs  Lab 07/24/21 0415 07/28/21 0346  AST 16 16  ALT 5 5  ALKPHOS 128* 122  BILITOT 1.9* 1.1  PROT 7.0 6.7  ALBUMIN 1.6* 1.7*    No results for input(s): LIPASE, AMYLASE in the last 168 hours. No results for input(s): AMMONIA in the last 168 hours. Coagulation Profile: No results for input(s): INR, PROTIME in the last 168 hours. Cardiac Enzymes: No results for input(s): CKTOTAL, CKMB, CKMBINDEX, TROPONINI in the last 168 hours. BNP (last 3 results) No results for input(s): PROBNP in the last 8760 hours. HbA1C: No results for input(s): HGBA1C in the last 72 hours.  CBG: Recent Labs  Lab 07/29/21 1536 07/29/21 1958 07/29/21 2331 07/30/21 0313 07/30/21 0722  GLUCAP 109* 85 92 78 103*    Lipid Profile: No results for input(s): CHOL, HDL, LDLCALC, TRIG, CHOLHDL, LDLDIRECT in the last 72 hours.  Thyroid Function Tests: No results for input(s): TSH, T4TOTAL, FREET4, T3FREE, THYROIDAB in the last 72 hours. Anemia Panel: No results for input(s): VITAMINB12, FOLATE, FERRITIN, TIBC, IRON, RETICCTPCT in the last 72 hours. Sepsis Labs: No results for input(s): PROCALCITON, LATICACIDVEN in the last 168 hours.   Recent Results (from the past 240 hour(s))  Resp Panel by RT-PCR (Flu A&B, Covid) Nasopharyngeal Swab     Status: None   Collection Time: 07/20/21  5:20 PM   Specimen: Nasopharyngeal Swab; Nasopharyngeal(NP) swabs in vial transport medium  Result Value Ref Range Status   SARS Coronavirus 2 by RT PCR NEGATIVE  NEGATIVE Final    Comment: (NOTE) SARS-CoV-2 target nucleic acids are NOT DETECTED.  The SARS-CoV-2 RNA is generally detectable in upper respiratory specimens during the acute phase of infection. The lowest concentration of SARS-CoV-2 viral copies this assay can detect is 138 copies/mL. A negative result does not preclude SARS-Cov-2 infection and should not be used as the sole basis for treatment or other patient management decisions. A negative result may occur with  improper specimen collection/handling, submission of specimen other than nasopharyngeal swab, presence of viral mutation(s) within the areas targeted by this assay, and inadequate number of viral copies(<138 copies/mL). A negative result must be combined with clinical observations, patient history, and epidemiological information. The expected result is Negative.  Fact Sheet for Patients:  BloggerCourse.com  Fact Sheet for Healthcare Providers:  SeriousBroker.it  This test is no t yet approved or cleared by the [place] FDA and  has been authorized for detection and/or diagnosis of SARS-CoV-2 by FDA under an Emergency Use Authorization (EUA). This EUA will remain  in effect (meaning this test can be used) for the duration of the COVID-19 declaration under Section 564(b)(1) of the Act, 21 U.S.C.section 360bbb-3(b)(1), unless the authorization is terminated  or revoked sooner.       Influenza A by PCR NEGATIVE NEGATIVE Final   Influenza B by PCR NEGATIVE NEGATIVE Final    Comment: (NOTE) The Xpert Xpress SARS-CoV-2/FLU/RSV plus assay is intended as an aid in the diagnosis of influenza from Nasopharyngeal swab specimens and should not be used as a sole basis for treatment. Nasal washings and aspirates are unacceptable for Xpert Xpress SARS-CoV-2/FLU/RSV testing.  Fact Sheet for Patients: BloggerCourse.com  Fact Sheet for Healthcare  Providers:  SeriousBroker.it  This test is not yet approved or cleared by the [place] and has been authorized for detection and/or diagnosis of SARS-CoV-2 by FDA under an Emergency Use Authorization (EUA). This EUA will remain in effect (meaning this test can be used) for the duration of the COVID-19 declaration under Section 564(b)(1) of the Act, 21 U.S.C. section 360bbb-3(b)(1), unless the authorization is terminated or revoked.  Performed at [facility], [address] [place], [place] [address]   Culture, blood (routine x 2)     Status: None   Collection Time: 07/20/21  6:06 PM   Specimen: BLOOD  Result Value Ref Range Status   Specimen Description BLOOD SITE NOT SPECIFIED  Final   Special Requests   Final    BOTTLES DRAWN AEROBIC AND ANAEROBIC Blood Culture adequate volume   Culture   Final    NO GROWTH 5 DAYS Performed at [facility], [address] [place], [place] [address]    Report Status 07/25/2021 FINAL  Final  Culture, blood (routine x 2)     Status: None   Collection Time: 07/20/21  6:09 PM   Specimen: BLOOD  Result Value Ref Range Status   Specimen Description BLOOD SITE NOT SPECIFIED  Final   Special Requests   Final    BOTTLES DRAWN AEROBIC ONLY Blood Culture adequate volume   Culture   Final    NO GROWTH 5 DAYS Performed at [facility], [address], [place], [place] [address]    Report Status 07/25/2021 FINAL  Final  MRSA Next Gen by PCR, Nasal     Status: None   Collection Time: 07/20/21  9:44 PM   Specimen: Nasal Mucosa; Nasal Swab  Result Value Ref Range Status   MRSA by PCR Next Gen NOT DETECTED NOT DETECTED Final    Comment: (NOTE) The GeneXpert MRSA Assay (FDA approved for NASAL specimens only), is one component of a comprehensive MRSA colonization surveillance program. It is not intended to diagnose MRSA infection nor to guide or monitor treatment for MRSA infections. Test performance  is not FDA approved in patients less than 53 years old. Performed at [facility], [address], [place], [place] [address]   Urine Culture     Status: Abnormal   Collection Time: 07/22/21  5:37 PM   Specimen: Urine, Catheterized  Result Value Ref Range Status   Specimen Description URINE, CATHETERIZED  Final   Special Requests   Final    NONE Performed at [facility], [address] [place], [place] [address]    Culture (A)  Final    30,000 COLONIES/mL KLEBSIELLA ORNITHINOLYTICA 40,000 COLONIES/mL YEAST    Report Status 07/25/2021 FINAL  Final   Organism ID, Bacteria KLEBSIELLA ORNITHINOLYTICA (A)  Final      Susceptibility   Klebsiella ornithinolytica - MIC*    AMPICILLIN >=32 RESISTANT Resistant     CEFAZOLIN >=64 RESISTANT Resistant     CEFEPIME >=32 RESISTANT Resistant     CEFTRIAXONE >=64 RESISTANT Resistant     CIPROFLOXACIN 1 SENSITIVE Sensitive     GENTAMICIN >=16 RESISTANT Resistant     IMIPENEM <=0.25 SENSITIVE Sensitive     NITROFURANTOIN 64 INTERMEDIATE Intermediate     TRIMETH/SULFA >=320 RESISTANT Resistant     AMPICILLIN/SULBACTAM >=32 RESISTANT Resistant     PIP/TAZO 32 INTERMEDIATE Intermediate     * 30,000 COLONIES/mL KLEBSIELLA ORNITHINOLYTICA      Radiology Studies: No results  found.   LOS: 10 days   Lanae Boast, MD [facility]  07/30/2021, 11:03 AM

## 2021-07-31 LAB — GLUCOSE, CAPILLARY
Glucose-Capillary: 100 mg/dL — ABNORMAL HIGH (ref 70–99)
Glucose-Capillary: 77 mg/dL (ref 70–99)
Glucose-Capillary: 82 mg/dL (ref 70–99)
Glucose-Capillary: 84 mg/dL (ref 70–99)
Glucose-Capillary: 92 mg/dL (ref 70–99)

## 2021-07-31 LAB — CBC
HCT: 22.5 % — ABNORMAL LOW (ref 36.0–46.0)
Hemoglobin: 7 g/dL — ABNORMAL LOW (ref 12.0–15.0)
MCH: 31.7 pg (ref 26.0–34.0)
MCHC: 31.1 g/dL (ref 30.0–36.0)
MCV: 101.8 fL — ABNORMAL HIGH (ref 80.0–100.0)
Platelets: 216 10*3/uL (ref 150–400)
RBC: 2.21 MIL/uL — ABNORMAL LOW (ref 3.87–5.11)
RDW: 18.8 % — ABNORMAL HIGH (ref 11.5–15.5)
WBC: 9.6 10*3/uL (ref 4.0–10.5)
nRBC: 0.3 % — ABNORMAL HIGH (ref 0.0–0.2)

## 2021-07-31 LAB — BASIC METABOLIC PANEL
Anion gap: 9 (ref 5–15)
BUN: 30 mg/dL — ABNORMAL HIGH (ref 8–23)
CO2: 20 mmol/L — ABNORMAL LOW (ref 22–32)
Calcium: 8.3 mg/dL — ABNORMAL LOW (ref 8.9–10.3)
Chloride: 109 mmol/L (ref 98–111)
Creatinine, Ser: 0.72 mg/dL (ref 0.44–1.00)
GFR, Estimated: >60 mL/min (ref >60)
Glucose, Bld: 103 mg/dL — ABNORMAL HIGH (ref 70–99)
Potassium: 3.9 mmol/L (ref 3.5–5.1)
Sodium: 138 mmol/L (ref 135–145)

## 2021-07-31 NOTE — Progress Notes (Signed)
PROGRESS NOTE    KALLIOPI [name]  WUJ:[id number] DOB: May 03, 1946 DOA: 07/20/2021 PCP: Darrow Bussing, MD   Brief Narrative: 54 old female with hypertension, polycythemia, hypertension alcohol use disorder two 12 ounce beer daily, recently admitted and discharged from [facility]  06/10/21-07/11/21 for 2 mechanical falls  and back pain at home-chest x-ray CT head C-spine unremarkable on admission, but had leukocytosis AKI abnormal LFTs urine culture with staph aureus blood culture was positive-and on further work-up found to have cervical epidural abscess C2-T3/4 status post ACDF on 06/16/2021 by  Dr Maurice Small +MSSA and was discharged on IV cefazolin and rifampin through 08/11/2021 and hospital course complicated by acute encephalopathy, dysphagia needing PEG tube, had recurrent urinary retention/UTI-Foley catheter was placed and discharged to SNF.  Readmitted from the nursing facility due to shortness of breath, more confusion than usual baseline .  In the ED:CT head showed acute/subacute possible pontine infarct, CT abdomen pelvis showed dystrophic changes inferior endplate of T12, L1 vertebral body, narrowing of the intervening intervertebral disc and a small amount of soft tissue thickening anteriorly, new finding compared to previous CTs and may represent sequelae of discitis/osteomyelitis, MRI lumbar C-spine was ordered and lab work showed leukocytosis 12.5, CT chest small amount of airspace consolidation versus atelectasis in the bilateral lung bases. ED called [facility] per daughter request but no bed, then patient was admitted MRI brain positive for 1.5 cm? acute to subacute ischemic nonhemorrhagic central pontine infarct. MRI C-spine and L spine done: Referto the report for more detail but shows -vertebral -cervical/lumbar osteomyelitis with epidural abscesses, paraspinal muscle abscesses.  9/11 Neurosurgery, ID, neurology was consulted Neurosurgery did not advise acute surgical intervention/ I&D. Also  seen by speech and placed on pured diet, was n.p.o. plus tube feed previously 9/13:Repeat MRI brain w/ contrast:no intracranial focus of abnormal contrast enhancement. 9/14 ID recommended to continue with cefazolin through 08/25/2021, outpatient ID follow-up, overall treatment failure in the setting of hardware is higher especially in the setting of Staph aureus  Neurology and ID signed off, neurosurgery again saw on  9/15 and signed off. 9/19-psychiatry consulted-meds as adjusted and patient no more verbally abusive and mentation much better on 9/20  Subjective: Seen and examined this morning. Patient was sleeping did wake up for me withdreww both lower legs and bent knees to touch sensation on her foot. " I am awake" Appears calm not verbally aggressive. Afebrile overnight, on room air. 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  Seen by ID, neurosurgery-did not advise acute surgical intervention. Blood culture no growth since admission.ID plans to continue Cefazolin through 08/25/2021 w/ PICC line in place. Per ID overall treatment failure in the setting of hardware is higher especially in the setting of Staph aureus.  Acute Metabolic encephalopathy: On last discharge mental state had cleared some on 9/1 but he still had intermittent confusion was able to follow simple commands- patient following simple commands here, nauseous is agitated and verbally abusive.Suspecting multifactorial in the setting of her persistent spine infection and deconditioning, history of alcohol abuse, now with subacute pontine infarct. S/P High-dose thiamine.Seen by neurology-had extensive work-up. Psychiatry  consulted 9/19 appreciate input, added Zyprexa 2.5 mg a.m. and mentation much better, less verbally abusive.Will minimize benzodiazepine Ativan antihistaminic and sedatives  as much as possible.Continue on PTOT, SLP, RD follow-up and supportive care .   Subacute pontine infarct: nocontrast MRI 1.5 cm 2/2 subacute ischemic likely affecting multiple penetrating arteries due to small vessel disease.MRI brain with contrast repeated 9/12-no intracranial focus of abnormal contrast-enhancement.  Discussed with neurologist. Completed stroke w/u with up with A1c 5.1 , lipid panel  ldl 88, goal < 70,CT angio head and neck no LVO. Echo with EF 60 to 65%, no R WMA, normal mitral valve, aortic valve with mild AR. Cont  ASA 81 mg, Lipitor 40 mg.  Generalized weakness in upper/lower extremities Deconditining Ambulaltory dysfunction Functional quadriplegia: multifactorial due to some cervical myelopathy/infection, pain related, encephalopathy, deconditioning,cont PT/OT, supportive care.    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  No need of further antibiotics continue cefazolin. ?  Colonization Continue Foley care, continue Flomax monitor urine output and renal function intermittently Recent Labs  Lab 07/27/21 0142 07/28/21 0346 07/29/21 0620 07/30/21 0257 07/31/21 0445  BUN 24* 25* 24* 32* 30*  CREATININE 0.80 0.78 0.72 0.76 0.72    Diarrhea/loose stool: Needing rectal tube briefly but resolved after changing Osmolite to  Jevity.  Continue probiotics while on antibiotics.   Hypokalemia resolved.  CT w/ Bilateral lung base consolidation/atelectasis-no respiratory symptoms, do not suspect pneumonia.  Encourage incentive spirometry  Moderate Protein Calorie malnutrition: low albumin. TF feeding changed to Jevity due to  diarrhea.  Also taking some orally.  Encourage p.o. intake.  Augment diet   Hypoglycemia-intermittently low noted y9/20 12 pm- monitor cbg. Cont TF Recent Labs  Lab 07/30/21 1216 07/30/21 1605 07/30/21 2017 07/31/21 0005 07/31/21 0354  GLUCAP 65* 82 76 84 92     Mildly elevated bilirubin:Monitor.  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  We will cut down dose of atarax as it  may be worsening her mental status , On prn Benadryl, skin cream.She has had this skin condition for few years. Rifampin stopped in case it was making the rash worse. Saw dermatology before.Will need dermatology follow up, ? biopsy  as OP.   Hypertension:BP stable Was on midodrine prior to admission, discontinued and tolerating well   Chronic anemia Epistaxis on last admission-needing blood transfusion Monitor transfuse if less than 7 g-hemoglobin slowly downtrending no obvious blood loss noted.  May need PRBC transfusion-type and screen Today check CBC in the morning. Recent Labs  Lab 07/27/21 0142 07/28/21 0346 07/29/21 0620 07/30/21 0257 07/31/21 0445  HGB 7.9* 7.7* 8.1* 7.2* 7.0*  HCT 25.4* 24.5* 25.7* 22.9* 22.5*   IHK:[id number] full code.Given her infectious process, encephalopathy, co-morbidities palliative care consulted.Patient daughter claims patient was functional prior to last hospital admission and requesting full aggressive care at this time.PMT involved, overall prognosis does not appear bright. Seen by multiple consults, attempted transfer to [facility] but was declined.  Plan is for SNF.Attempted transfer EDP called [facility] but no beds so admitted to [facility]. Per daughter request called [facility] transfer line 9/12 evening and was  called by by Dr Collie Siad and NOT accepted for transfer. Diet Order             DIET - DYS 1 Room service appropriate? Yes; Fluid consistency: Thin  Diet effective now                 Patient's There is no  height or  weight on file to calculate BMI.  DVT prophylaxis: enoxaparin (LOVENOX) injection 40 mg Start: 07/23/21 1545 SCDs Start: 07/21/21 0106. Code Status:   Code Status: Full Code  Family Communication: plan of care discussed with husband and daughter along with ID palliative care at bedside 9/12, multiple calls made since then.  TOC invilved and communicating with daughter. She is aware abt plan for SNF and cont on iv antibiotics, ptot supportive care.  Status OV:FIEPPIRJJ Remains inpatient appropriate because:Ongoing diagnostic testing needed not appropriate for outpatient work up, IV treatments appropriate due to intensity of illness or inability to take PO, and Inpatient level of care appropriate due to severity of illness  Dispo:The patient is from: SNF [facility]             Anticipated d/c is to:SNF, daughter prefers to go to [place] and not back to [place]. TOC consulted-pending bed offers            Patient currently stable.            Difficult to place patient No  Unresulted Labs (From admission, onward)     Start     Ordered   07/31/21 0500  Basic metabolic panel  Daily,   R     Question:  Specimen collection method  Answer:  Unit=Unit collect   07/30/21 1119   07/31/21 0500  CBC  Daily,   R     Question:  Specimen collection method  Answer:  Unit=Unit collect   07/30/21 1119          Medications reviewed: Scheduled Meds:  acetaminophen  500 mg Per Tube Q8H   allopurinol  100 mg Per Tube Daily   aspirin  81 mg Per Tube Daily   atorvastatin  40 mg Per Tube QHS   Chlorhexidine Gluconate Cloth  6 each Topical Daily   diclofenac Sodium  4 g Topical TID AC & HS   enoxaparin (LOVENOX) injection  40 mg Subcutaneous Q24H   feeding supplement (PROSource TF)  45 mL Per Tube TID   free water  200 mL Per Tube Q4H   geriatric multivitamins-minerals  15 mL Oral Daily   lubriderm seriously sensitive   Topical BID   melatonin  3 mg Oral QHS   metoprolol tartrate  25 mg  Per Tube BID   OLANZapine  2.5 mg Per Tube Daily   OLANZapine  5 mg Per Tube QHS   potassium chloride  20 mEq Per Tube Daily   saccharomyces boulardii  250 mg Per Tube BID   sodium chloride flush  10-40 mL Intracatheter Q12H   thiamine  100 mg Per Tube Q8H   triamcinolone 0.1 % cream : eucerin   Topical BID   Continuous Infusions:   ceFAZolin (ANCEF) IV 2 g (07/31/21 0631)   feeding supplement (JEVITY 1.5 CAL/FIBER) 1,000 mL (07/30/21 0537)   Consultants:see note  Procedures:see note Antimicrobials: Anti-infectives (From admission, onward)    Start     Dose/Rate Route Frequency Ordered Stop   07/24/21 1400  ceFAZolin (ANCEF) IVPB 2g/100 mL premix        2 g 200 mL/hr over 30 Minutes Intravenous Every 8 hours 07/24/21 1150 08/25/21 1359   07/22/21 2200  nafcillin 2 g in sodium chloride 0.9 % 100 mL IVPB  Status:  Discontinued        2 g 200 mL/hr over 30 Minutes Intravenous Every 4 hours 07/22/21 1320 07/24/21 1150   07/22/21 1215  vancomycin (VANCOREADY) IVPB 1250  mg/250 mL  Status:  Discontinued        1,250 mg 166.7 mL/hr over 90 Minutes Intravenous Every 24 hours 07/22/21 1120 07/22/21 1320   07/21/21 1700  rifampin (RIFADIN) 300 mg in sodium chloride 0.9 % 100 mL IVPB  Status:  Discontinued        300 mg 200 mL/hr over 30 Minutes Intravenous Every 12 hours 07/21/21 1327 07/22/21 1014   07/21/21 1600  nafcillin 2 g in sodium chloride 0.9 % 100 mL IVPB  Status:  Discontinued        2 g 200 mL/hr over 30 Minutes Intravenous Every 4 hours 07/21/21 1455 07/22/21 1014   07/21/21 1330  nafcillin injection 2 g  Status:  Discontinued        2 g Intravenous Every 4 hours 07/21/21 1326 07/21/21 1455   07/21/21 0000  vancomycin (VANCOCIN) IVPB 1000 mg/200 mL premix  Status:  Discontinued        1,000 mg 200 mL/hr over 60 Minutes Intravenous Every 12 hours 07/20/21 2156 07/21/21 1326   07/20/21 2200  ceFEPIme (MAXIPIME) 2 g in sodium chloride 0.9 % 100 mL IVPB  Status:  Discontinued         2 g 200 mL/hr over 30 Minutes Intravenous Every 8 hours 07/20/21 2143 07/21/21 1326   07/20/21 2145  vancomycin (VANCOREADY) IVPB 1750 mg/350 mL        1,750 mg 175 mL/hr over 120 Minutes Intravenous  Once 07/20/21 2143 07/21/21 0308      Culture/Microbiology    Component Value Date/Time   SDES URINE, CATHETERIZED 07/22/2021 1737   SPECREQUEST  07/22/2021 1737    NONE Performed at [facility], [address] [place], [place] [address]    CULT (A) 07/22/2021 1737    30,000 COLONIES/mL KLEBSIELLA ORNITHINOLYTICA 40,000 COLONIES/mL YEAST    REPTSTATUS 07/25/2021 FINAL 07/22/2021 1737    Other culture-see note  Objective: Vitals: Today's Vitals   07/30/21 1559 07/30/21 2008 07/31/21 0007 07/31/21 0355  BP: 138/79 127/72 (!) 155/84 110/71  Pulse: 93 97 85 91  Resp: 18 (!) 22 20 20   Temp: 98.8 F (37.1 C) 98.1 F (36.7 C) 98 F (36.7 C) 98 F (36.7 C)  TempSrc: Axillary Oral    SpO2: 100% 100% 93% 100%  PainSc:        Intake/Output Summary (Last 24 hours) at 07/31/2021 0733 Last data filed at 07/31/2021 0355 Gross per 24 hour  Intake 2674 ml  Output 2352 ml  Net 322 ml    There were no vitals filed for this visit. Weight change:   Intake/Output from previous day: 09/20 0701 - 09/21 0700 In: 2674 [NG/GT:2674] Out: 2352 [Urine:2350; Stool:2] Intake/Output this shift: No intake/output data recorded. There were no vitals filed for this visit.  Examination:  General exam: Alert awake oriented to self place, HEENT:Oral mucosa moist, Ear/Nose WNL grossly, dentition normal. Respiratory system: bilaterally diminished, no use of accessory muscle Cardiovascular system: S1 & S2 +, No JVD,. Gastrointestinal system: Abdomen soft,PEG+ NT,ND, BS+ Nervous System:Alert, awake, moving extremities and grossly nonfocal Extremities: No edema, distal peripheral pulses palpable.  Skin: Dry scaly skin , areas of scars, multiple sites, hyperkeratosis on foot no rashes,no  icterus. MSK: Normal muscle bulk,tone, power   Data Reviewed: I have personally reviewed following labs and imaging studies CBC: Recent Labs  Lab 07/27/21 0142 07/28/21 0346 07/29/21 0620 07/30/21 0257 07/31/21 0445  WBC 13.4* 13.3* 11.5* 10.9* 9.6  HGB 7.9* 7.7* 8.1*  7.2* 7.0*  HCT 25.4* 24.5* 25.7* 22.9* 22.5*  MCV 99.6 99.2 100.4* 100.9* 101.8*  PLT 284 280 265 231 216    Basic Metabolic Panel: Recent Labs  Lab 07/27/21 0142 07/28/21 0346 07/29/21 0620 07/30/21 0257 07/31/21 0445  NA 140 140 138 137 138  K 3.8 3.7 3.8 3.9 3.9  CL 110 109 108 107 109  CO2 20* 21* 20* 20* 20*  GLUCOSE 85 87 95 81 103*  BUN 24* 25* 24* 32* 30*  CREATININE 0.80 0.78 0.72 0.76 0.72  CALCIUM 8.7* 8.4* 8.5* 8.2* 8.3*    GFR: Estimated Creatinine Clearance: 66.4 mL/min (by C-G formula based on SCr of 0.72 mg/dL). Liver Function Tests: Recent Labs  Lab 07/28/21 0346  AST 16  ALT 5  ALKPHOS 122  BILITOT 1.1  PROT 6.7  ALBUMIN 1.7*    No results for input(s): LIPASE, AMYLASE in the last 168 hours. No results for input(s): AMMONIA in the last 168 hours. Coagulation Profile: No results for input(s): INR, PROTIME in the last 168 hours. Cardiac Enzymes: No results for input(s): CKTOTAL, CKMB, CKMBINDEX, TROPONINI in the last 168 hours. BNP (last 3 results) No results for input(s): PROBNP in the last 8760 hours. HbA1C: No results for input(s): HGBA1C in the last 72 hours.  CBG: Recent Labs  Lab 07/30/21 1216 07/30/21 1605 07/30/21 2017 07/31/21 0005 07/31/21 0354  GLUCAP 65* 82 76 84 92    Lipid Profile: No results for input(s): CHOL, HDL, LDLCALC, TRIG, CHOLHDL, LDLDIRECT in the last 72 hours.  Thyroid Function Tests: No results for input(s): TSH, T4TOTAL, FREET4, T3FREE, THYROIDAB in the last 72 hours. Anemia Panel: No results for input(s): VITAMINB12, FOLATE, FERRITIN, TIBC, IRON, RETICCTPCT in the last 72 hours. Sepsis Labs: No results for input(s): PROCALCITON,  LATICACIDVEN in the last 168 hours.   Recent Results (from the past 240 hour(s))  Urine Culture     Status: Abnormal   Collection Time: 07/22/21  5:37 PM   Specimen: Urine, Catheterized  Result Value Ref Range Status   Specimen Description URINE, CATHETERIZED  Final   Special Requests   Final    NONE Performed at [facility], [address] [place], [place] [address]    Culture (A)  Final    30,000 COLONIES/mL KLEBSIELLA ORNITHINOLYTICA 40,000 COLONIES/mL YEAST    Report Status 07/25/2021 FINAL  Final   Organism ID, Bacteria KLEBSIELLA ORNITHINOLYTICA (A)  Final      Susceptibility   Klebsiella ornithinolytica - MIC*    AMPICILLIN >=32 RESISTANT Resistant     CEFAZOLIN >=64 RESISTANT Resistant     CEFEPIME >=32 RESISTANT Resistant     CEFTRIAXONE >=64 RESISTANT Resistant     CIPROFLOXACIN 1 SENSITIVE Sensitive     GENTAMICIN >=16 RESISTANT Resistant     IMIPENEM <=0.25 SENSITIVE Sensitive     NITROFURANTOIN 64 INTERMEDIATE Intermediate     TRIMETH/SULFA >=320 RESISTANT Resistant     AMPICILLIN/SULBACTAM >=32 RESISTANT Resistant     PIP/TAZO 32 INTERMEDIATE Intermediate     * 30,000 COLONIES/mL KLEBSIELLA ORNITHINOLYTICA      Radiology Studies: No results found.   LOS: 11 days   Lanae Boast, MD [facility]  07/31/2021, 7:33 AM

## 2021-07-31 NOTE — TOC Progression Note (Signed)
Transition of Care [facility]) - Progression Note    Patient Details  Name: Kristy [name] MRN: [id number] Date of Birth: [birth year]  [facility] CM/SW Contact  Baldemar Len[place], [place] Phone Number: 07/31/2021, 12:02 PM  Clinical Narrative:   CSW contacted [facility] in [place] to discuss patient's referral. [facility] has no beds available and unsure when they'll have a bed available, no time soon. CSW also asked them about a tube feeding shortage and they said they weren't aware of a tube feeding shortage. CSW attempted to contact daughter to discuss, but received an error message in trying to call the daughter. CSW sent a text asking for a call back.    Expected Discharge Plan: Skilled Nursing Facility Barriers to Discharge: Continued Medical Work up, SNF Pending bed offer  Expected Discharge Plan and Services Expected Discharge Plan: Skilled Nursing Facility     Post Acute Care Choice: Skilled Nursing Facility Living arrangements for the past 2 months: Skilled Nursing Facility                                       Social Determinants of Health (SDOH) Interventions    Readmission Risk Interventions No flowsheet data found.

## 2021-07-31 NOTE — Plan of Care (Signed)

## 2021-08-01 LAB — GLUCOSE, CAPILLARY
Glucose-Capillary: 106 mg/dL — ABNORMAL HIGH (ref 70–99)
Glucose-Capillary: 80 mg/dL (ref 70–99)
Glucose-Capillary: 83 mg/dL (ref 70–99)
Glucose-Capillary: 92 mg/dL (ref 70–99)
Glucose-Capillary: 93 mg/dL (ref 70–99)
Glucose-Capillary: 97 mg/dL (ref 70–99)

## 2021-08-01 LAB — TYPE AND SCREEN
ABO/RH(D): B NEG
Antibody Screen: NEGATIVE

## 2021-08-01 LAB — BASIC METABOLIC PANEL
Anion gap: 9 (ref 5–15)
BUN: 25 mg/dL — ABNORMAL HIGH (ref 8–23)
CO2: 22 mmol/L (ref 22–32)
Calcium: 8.8 mg/dL — ABNORMAL LOW (ref 8.9–10.3)
Chloride: 108 mmol/L (ref 98–111)
Creatinine, Ser: 0.69 mg/dL (ref 0.44–1.00)
GFR, Estimated: >60 mL/min (ref >60)
Glucose, Bld: 89 mg/dL (ref 70–99)
Potassium: 4 mmol/L (ref 3.5–5.1)
Sodium: 139 mmol/L (ref 135–145)

## 2021-08-01 LAB — CBC
HCT: 24.8 % — ABNORMAL LOW (ref 36.0–46.0)
Hemoglobin: 7.8 g/dL — ABNORMAL LOW (ref 12.0–15.0)
MCH: 31.6 pg (ref 26.0–34.0)
MCHC: 31.5 g/dL (ref 30.0–36.0)
MCV: 100.4 fL — ABNORMAL HIGH (ref 80.0–100.0)
Platelets: 227 10*3/uL (ref 150–400)
RBC: 2.47 MIL/uL — ABNORMAL LOW (ref 3.87–5.11)
RDW: 18.6 % — ABNORMAL HIGH (ref 11.5–15.5)
WBC: 11.6 10*3/uL — ABNORMAL HIGH (ref 4.0–10.5)
nRBC: 0 % (ref 0.0–0.2)

## 2021-08-01 NOTE — Progress Notes (Addendum)
Speech Language Pathology Treatment: Dysphagia  Patient Details Name: Kristy [name] MRN: [id number] DOB: 09-16-1946 Today's Date: 08/01/2021 Time: 8099-8338 SLP Time Calculation (min) (ACUTE ONLY): 13 min  Assessment / Plan / Recommendation Clinical Impression  Pt was more willing to participate today- She allowed minimal repositioning to optimize safety.  HOB was elevated to allow safe eating.  She accepted several bites of a pop tart with quite functional mastication and consumed successive boluses of thin liquid with no s/s of aspiration, self-feeding with RUE. When motivated to eat, her oral and pharyngeal function appears to be WNL.  Primary barrier to PO intake remains her mental status.    Recommend advancing diet to dysphagia 3/mechanical soft, thin liquids to liberalize choices here and in next level of care.  Pt will need assist with tray set-up and self-feeding as needed.  There are no further SLP needs.  Our service will sign off.    HPI HPI: Kristy [name]  is a 75 y.o. female, with medical history of essential hypertension, polycythemia, alcohol use disorder who was recently discharged from [facility] on 07/11/2021 after she was found to have cervical epidural abscess C2 to T3/4 s/p ACDF on 06/16/2021  by neurosurgery, cultures are positive for MSSA, patient started on IV cefazolin and rifampin until 08/11/2021, hospital course was complicated by encephalopathy, dysphagia, PEG tube placed, recurrent urinary retention, Foley catheter placed.  As per daughter when she visited patient today she was more short of breath and found her to be more confused than usual.  Patient was brought to the ED for further evaluation.  Initially daughter wanted her to be transferred to [facility] however ED provider called [facility] and was told that she may have to wait for many days , since there was no urgent need for transfer except family request.  In the ED imaging, CT head showed acute/subacute  possible pontine infarct.  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  Patient is well known to ST services from previous admission.  Decreased alertness and mentation appeared to be her biggest risk factors for aspiration.  During that admission she recieved a PEG.      SLP Plan  Discharge SLP treatment due to (comment)      Recommendations for follow up therapy are one component of a multi-disciplinary discharge planning process, led by the attending physician.  Recommendations may be updated based on patient status, additional functional criteria and insurance authorization.    Recommendations  Diet recommendations: Dysphagia 3 (mechanical soft);Thin liquid Liquids provided via: Straw;Cup Medication Administration: Via alternative means Supervision: Staff to assist with self feeding Compensations: Minimize environmental distractions                Oral Care Recommendations: Oral care BID Follow up Recommendations: Skilled Nursing facility;24 hour supervision/assistance SLP Visit Diagnosis: Dysphagia, unspecified (R13.10) Plan: Discharge SLP treatment due to (comment)- eating/swallowing safely       GO               Kristy Bianca L. Samson Frederic, MA CCC/SLP Acute Rehabilitation Services Office number [phone] Pager [phone]  Kristy [name] 08/01/2021, 11:02 AM

## 2021-08-01 NOTE — Progress Notes (Signed)
Nutrition Follow-up  DOCUMENTATION CODES:   Non-severe (moderate) malnutrition in context of chronic illness  INTERVENTION:   Continue TF via PEG: -Jevity 1.5 @ 55 mL/hr (1320 mL/day) -45 mL ProSource TF TID -Free water per MD, currently 200 ml Q 4H        Provides 2100 kcal, 116 grams protein, 1003 mL free water (2203 mL total free water w/ flushes)  NUTRITION DIAGNOSIS:   Moderate Malnutrition related to chronic illness as evidenced by mild fat depletion, mild muscle depletion.  Ongoing  GOAL:   Patient will meet greater than or equal to 90% of their needs  Met with TF  MONITOR:   PO intake, TF tolerance, Skin  REASON FOR ASSESSMENT:   Consult Enteral/tube feeding initiation and management  ASSESSMENT:   Pt admitted with confusion and shortness of breath; found with acute ischemic stroke and worsening spinal abscess. Pt recently discharged from [facility] on 9/1 to SNF. No pertinent PMH.  9/22- s/p BSE- advanced to dysphagia 3 diet with thin liquids  Reviewed I/O's: -1.5 L x 24 hours and -6.7 L since admission  UOP: 1.5 L x 24 hours  Pt sleeping soundly at time of visit.   Case discussed with RN, who reports pt continues to tolerate TF well. Per RN, pt is not eating despite being on a diet. No meal completions documented. Per SLP note, pt was more participatory today and advanced to dysphagia 3 diet.   Medications reviewed and include melatonin, potassium chloride, florastor, and thiamine.   Labs reviewed: CBGS: 80-106.   Diet Order:   Diet Order             DIET DYS 3 Room service appropriate? Yes with Assist; Fluid consistency: Thin  Diet effective now                   EDUCATION NEEDS:   No education needs have been identified at this time  Skin:  Skin Assessment: Skin Integrity Issues: Skin Integrity Issues:: Other (Comment) Other: Dry; rash; flakey; tears on buttocks  Last BM:  07/31/21 (type 6)  Height:   Ht Readings from Last 1  Encounters:  07/19/21 '5\' 6"'  (1.676 m)    Weight:   Wt Readings from Last 1 Encounters:  07/19/21 84.1 kg    Ideal Body Weight:  59.1 kg  BMI:  There is no height or weight on file to calculate BMI.  Estimated Nutritional Needs:   Kcal:  2100-2300  Protein:  100-120 gm  Fluid:  > 2.1 L    Loistine Chance, RD, LDN, [place] Registered Dietitian II Certified Diabetes Care and Education Specialist Please refer to [facility] for RD and/or RD on-call/weekend/after hours pager

## 2021-08-01 NOTE — TOC Progression Note (Addendum)
Transition of Care [facility]) - Progression Note    Patient Details  Name: Kristy [name] MRN: [id number] Date of Birth: [birth year]  Transition of Care [facility]) CM/SW Contact  Kermit Balo, RN Phone Number: 08/01/2021, 10:39 AM  Clinical Narrative:    CM spoke to patients daughter this am. She is interested in [facility] for rehab. [facility] offered a bed but currently are not taking admissions and can not say when they will start.  CM reached out to [facility], [facility] and rehab and [place] [facility], per daughter request, and had to leave voicemails at both facilities.  TOC following.  Expected Discharge Plan: Skilled Nursing Facility Barriers to Discharge: Continued Medical Work up, SNF Pending bed offer  Expected Discharge Plan and Services Expected Discharge Plan: Skilled Nursing Facility     Post Acute Care Choice: Skilled Nursing Facility Living arrangements for the past 2 months: Skilled Nursing Facility                                       Social Determinants of Health (SDOH) Interventions    Readmission Risk Interventions No flowsheet data found.

## 2021-08-01 NOTE — Progress Notes (Signed)
PROGRESS NOTE    Kristy [name]  ZOX:[id number] DOB: 22-Apr-1946 DOA: 07/20/2021 PCP: Darrow Bussing, MD   Brief Narrative: 83 old female with hypertension, polycythemia, hypertension alcohol use disorder two 12 ounce beer daily, recently admitted and discharged from [facility]  06/10/21-07/11/21 for 2 mechanical falls  and back pain at home-chest x-ray CT head C-spine unremarkable on admission, but had leukocytosis AKI abnormal LFTs urine culture with staph aureus blood culture was positive-and on further work-up found to have cervical epidural abscess C2-T3/4 status post ACDF on 06/16/2021 by  Dr Maurice Small +MSSA and was discharged on IV cefazolin and rifampin through 08/11/2021 and hospital course complicated by acute encephalopathy, dysphagia needing PEG tube, had recurrent urinary retention/UTI-Foley catheter was placed and discharged to SNF.  Readmitted from the nursing facility due to shortness of breath, more confusion than usual baseline .  In the ED:CT head showed acute/subacute possible pontine infarct, CT abdomen pelvis showed dystrophic changes inferior endplate of T12, L1 vertebral body, narrowing of the intervening intervertebral disc and a small amount of soft tissue thickening anteriorly, new finding compared to previous CTs and may represent sequelae of discitis/osteomyelitis, MRI lumbar C-spine was ordered and lab work showed leukocytosis 12.5, CT chest small amount of airspace consolidation versus atelectasis in the bilateral lung bases. ED called [facility] per daughter request but no bed, then patient was admitted MRI brain positive for 1.5 cm? acute to subacute ischemic nonhemorrhagic central pontine infarct. MRI C-spine and L spine done: Referto the report for more detail but shows -vertebral -cervical/lumbar osteomyelitis with epidural abscesses, paraspinal muscle abscesses.  9/11 Neurosurgery, ID, neurology was consulted-Neurosurgery did not advise acute surgical intervention/ I&D.   SLP-started pured diet, was n.p.o. plus tube feed PTA 9/13:Repeat MRI brain w/ contrast:no intracranial focus of abnormal contrast enhancement. 9/14 ID recommended to continue with cefazolin through 08/25/2021, outpatient ID follow-up, overall treatment failure in the setting of hardware is higher especially in the setting of Staph aureus  Neurology and ID signed off, neurosurgery again saw on  9/15 and signed off. 9/19-psychiatry consulted-meds as adjusted and patient no more verbally abusive and mentation much better on 9/20  Subjective: Seen this morning.  She is alert awake calm Follow simple commands.  Pleasantly confused Told me her name date of birth thinks she is at home but added " this hospital is like home" She was able to raise her lower extremities well above the ground with good strength Upper extremity still weak but able to squeeze Afebrile overnight blood pressure stable.  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  Seen by ID, neurosurgery-did not advise acute surgical intervention. Blood culture no growth this admission.I D plans to continue Cefazolin through 08/25/2021 w/ PICC line in place but overall treatment failure in the setting of hardware is higher especially in the setting of Staph aureus.  Acute Metabolic encephalopathy: On last discharge mental state had cleared some on 9/1 but he still had intermittent confusion was able to follow simple commands.uspecting multifactorial in the setting of her persistent spine infection and deconditioning, history of alcohol abuse, now with subacute pontine infarct. S/P High-dose thiamine.Seen by neurology-had extensive work-up. Psychiatry consulted  9/19 as patient was verbally abusing -added Zyprexa 2.5 mg a.m. and now more calm.Will minimize  benzodiazepine Ativan antihistaminic and sedatives as much as possible. Prn Atarax dose decreased to 12.5 g continue on PTOT, SLP, RD follow-up and supportive care .   Subacute pontine infarct: nocontrast MRI 1.5 cm 2/2 subacute ischemic likely affecting multiple penetrating arteries due to small vessel disease.MRI brain with contrast repeated 9/12-no intracranial focus of abnormal contrast-enhancement.  Discussed with neurologist. Completed stroke w/u with up with A1c 5.1 , lipid panel  ldl 88, goal < 70,CT angio head and neck no LVO. Echo with EF 60 to 65%, no R WMA, normal mitral valve, aortic valve with mild AR. Cont  ASA 81 mg, Lipitor 40 mg.  Generalized weakness in upper/lower extremities/Deconditining Ambulaltory dysfunction/Functional quadriplegia: multifactorial due to above 3 , some cervical myelopathy/infection, pain related, encephalopathy, deconditioning,cont PT/OT, supportive care.    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  No need of further antibiotics continue cefazolin. ?  Colonization Continue Foley care, continue Flomax monitor urine output and renal function intermittently Recent Labs  Lab 07/28/21 0346 07/29/21 0620 07/30/21 0257 07/31/21 0445 08/01/21 0350  BUN 25* 24* 32* 30* 25*  CREATININE 0.78 0.72 0.76 0.72 0.69   Diarrhea/loose stool: There is needing rectal tube briefly but resolved after changing Osmolite to  Jevity.  Continue probiotics while on antibiotics.   Hypokalemia resolved.  CT w/ Bilateral lung base consolidation/atelectasis-no respiratory symptoms, do not suspect pneumonia.  Encourage incentive spirometry  Moderate Protein Calorie malnutrition: Hypoglycemia: Hypoalbuminemia: TF feeding  changed to Jevity due to diarrhea.  Encourage oral intake, augment nutritional status intermittently low noted y9/20 12 pm- monitor cbg. Cont TF Recent Labs  Lab 07/31/21 1556 07/31/21 2028 08/01/21 0004 08/01/21 0356 08/01/21 0844  GLUCAP 82 100* 106* 80 83    Mildly elevated bilirubin:Monitor.  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  We will cut down dose of atarax as it  may be worsening her mental status , On prn Benadryl, skin cream.She has had this skin condition for few years. Rifampin stopped in case it was making the rash worse. Saw dermatology before.Will need dermatology follow up, ? biopsy  as OP.   Hypertension:BP stable Was on midodrine prior to admission, discontinued and tolerating well   Chronic anemia Epistaxis on last admission-needing blood transfusion Monitor transfuse if less than 7 g-hemoglobin slowly downtrending no obvious blood loss noted.  May need PRBC transfusion-type and screened, transfuse if less than 7 g.  Recent Labs  Lab 07/28/21 0346 07/29/21 0620 07/30/21 0257 07/31/21 0445 08/01/21 0350  HGB 7.7* 8.1* 7.2* 7.0* 7.8*  HCT 24.5* 25.7* 22.9* 22.5* 24.8*  ZSM:OLMBEMLJQ full code.Given her infectious process, encephalopathy, co-morbidities palliative care consulted.Patient daughter claims patient was functional prior to last hospital admission and requesting full aggressive care at this time.PMT involved, overall prognosis does not appear bright. Seen by multiple consults, attempted transfer to [facility] but was declined.  Plan is for SNF.Attempted transfer EDP called [facility] but no beds so admitted to [facility]. Per daughter request called [facility] transfer line 9/12 evening and was  called by by Dr Collie Siad and NOT accepted for transfer. Diet Order             DIET - DYS 1 Room service appropriate? Yes; Fluid consistency: Thin  Diet effective now                 Patient's There is  no  height or weight on file to calculate BMI.  DVT prophylaxis: enoxaparin (LOVENOX) injection 40 mg Start: 07/23/21 1545 SCDs Start: 07/21/21 0106. Code Status:   Code Status: Full Code  Family Communication: plan of care discussed with husband and daughter along with ID palliative care at bedside 9/12, multiple calls made since then.  TOC invilved and communicating with daughter. She is aware abt plan for SNF and cont on iv antibiotics, ptot supportive care.  Status JX:[id number] Remains inpatient appropriate because:Ongoing diagnostic testing needed not appropriate for outpatient work up, IV treatments appropriate due to intensity of illness or inability to take PO, and Inpatient level of care appropriate due to severity of illness  Dispo:The patient is from: SNF [facility]             Anticipated d/c is to:SNF, daughter prefers to go to [place] and not back to [place]. TOC consulted-pending bed offers            Patient currently stable.            Difficult to place patient No  Unresulted Labs (From admission, onward)     Start     Ordered   07/31/21 0500  Basic metabolic panel  Daily,   R     Question:  Specimen collection method  Answer:  Unit=Unit collect   07/30/21 1119   07/31/21 0500  CBC  Daily,   R     Question:  Specimen collection method  Answer:  Unit=Unit collect   07/30/21 1119          Medications reviewed: Scheduled Meds:  acetaminophen  500 mg Per Tube Q8H   allopurinol  100 mg Per Tube Daily   aspirin  81 mg Per Tube Daily   atorvastatin  40 mg Per Tube QHS   Chlorhexidine Gluconate Cloth  6 each Topical Daily   diclofenac Sodium  4 g Topical TID AC & HS   enoxaparin (LOVENOX) injection  40 mg Subcutaneous Q24H   feeding supplement (PROSource TF)  45 mL Per Tube TID   free water  200 mL Per Tube Q4H   geriatric multivitamins-minerals  15 mL Oral Daily   lubriderm seriously sensitive   Topical BID   melatonin  3 mg Oral QHS   metoprolol tartrate  25 mg  Per Tube BID   OLANZapine  2.5 mg Per Tube Daily   OLANZapine  5 mg Per Tube QHS   potassium chloride  20 mEq Per Tube Daily   saccharomyces boulardii  250 mg Per Tube BID   sodium chloride flush  10-40 mL Intracatheter Q12H   thiamine  100 mg Per Tube Q8H   triamcinolone 0.1 % cream : eucerin   Topical BID   Continuous Infusions:   ceFAZolin (ANCEF) IV 2 g (08/01/21 0502)   feeding supplement (JEVITY 1.5 CAL/FIBER) 1,000 mL (08/01/21 0912)   Consultants:see note  Procedures:see note Antimicrobials: Anti-infectives (From admission, onward)    Start     Dose/Rate Route Frequency Ordered Stop   07/24/21 1400  ceFAZolin (ANCEF) IVPB 2g/100 mL premix        2 g 200 mL/hr over 30 Minutes Intravenous Every 8 hours 07/24/21 1150 08/25/21 1359   07/22/21 2200  nafcillin 2 g in sodium chloride 0.9 % 100 mL IVPB  Status:  Discontinued        2 g 200 mL/hr over 30 Minutes Intravenous Every 4 hours 07/22/21 1320 07/24/21 1150   07/22/21 1215  vancomycin (VANCOREADY) IVPB 1250 mg/250 mL  Status:  Discontinued        1,250 mg 166.7 mL/hr over 90 Minutes Intravenous Every 24 hours 07/22/21 1120 07/22/21 1320   07/21/21 1700  rifampin (RIFADIN) 300 mg in sodium chloride 0.9 % 100 mL IVPB  Status:  Discontinued        300 mg 200 mL/hr over 30 Minutes Intravenous Every 12 hours 07/21/21 1327 07/22/21 1014   07/21/21 1600  nafcillin 2 g in sodium chloride 0.9 % 100 mL IVPB  Status:  Discontinued        2 g 200 mL/hr over 30 Minutes Intravenous Every 4 hours 07/21/21 1455 07/22/21 1014   07/21/21 1330  nafcillin injection 2 g  Status:  Discontinued        2 g Intravenous Every 4 hours 07/21/21 1326 07/21/21 1455   07/21/21 0000  vancomycin (VANCOCIN) IVPB 1000 mg/200 mL premix  Status:  Discontinued        1,000 mg 200 mL/hr over 60 Minutes Intravenous Every 12 hours 07/20/21 2156 07/21/21 1326   07/20/21 2200  ceFEPIme (MAXIPIME) 2 g in sodium chloride 0.9 % 100 mL IVPB  Status:  Discontinued         2 g 200 mL/hr over 30 Minutes Intravenous Every 8 hours 07/20/21 2143 07/21/21 1326   07/20/21 2145  vancomycin (VANCOREADY) IVPB 1750 mg/350 mL        1,750 mg 175 mL/hr over 120 Minutes Intravenous  Once 07/20/21 2143 07/21/21 0308      Culture/Microbiology    Component Value Date/Time   SDES URINE, CATHETERIZED 07/22/2021 1737   SPECREQUEST  07/22/2021 1737    NONE Performed at [facility], [address], [place], [place] [address]    CULT (A) 07/22/2021 1737    30,000 COLONIES/mL KLEBSIELLA ORNITHINOLYTICA 40,000 COLONIES/mL YEAST    REPTSTATUS 07/25/2021 FINAL 07/22/2021 1737    Other culture-see note  Objective: Vitals: Today's Vitals   08/01/21 0112 08/01/21 0200 08/01/21 0357 08/01/21 0900  BP:   99/82 (!) 150/87  Pulse:   98 100  Resp:   17 18  Temp:   97.7 F (36.5 C) 98.1 F (36.7 C)  TempSrc:   Oral Axillary  SpO2:   100% 98%  PainSc: 10-Worst pain ever Asleep      Intake/Output Summary (Last 24 hours) at 08/01/2021 0941 Last data filed at 07/31/2021 2316 Gross per 24 hour  Intake 10 ml  Output 1500 ml  Net -1490 ml   There were no vitals filed for this visit. Weight change:   Intake/Output from previous day: 09/21 0701 - 09/22 0700 In: 10 [I.V.:10] Out: 1500 [Urine:1500] Intake/Output this shift: No intake/output data recorded. There were no vitals filed for this visit.  Examination:  General exam: AAOx 2, older than stated age, weak appearing. HEENT:Oral mucosa moist, Ear/Nose WNL grossly, dentition normal. Respiratory system: bilaterally diminished, no use of accessory muscle Cardiovascular system: S1 & S2 +, No JVD,. Gastrointestinal system: Abdomen soft,NT,ND, BS+ Nervous System:Alert, awake, able to raise her lower extremities well above the ground with good strength Upper extremity still weak but able to squeeze Extremities: Chronic hyperkeratotic skin changes on the foot,distal peripheral pulses palpable.  Skin: Chronic   mark of scratching, raw rash no rashes,no icterus. MSK: Normal muscle bulk,tone, power   Data Reviewed: I have personally reviewed following labs and imaging studies CBC: Recent Labs  Lab 07/28/21 0346 07/29/21 0620 07/30/21 0257 07/31/21 0445 08/01/21  0350  WBC 13.3* 11.5* 10.9* 9.6 11.6*  HGB 7.7* 8.1* 7.2* 7.0* 7.8*  HCT 24.5* 25.7* 22.9* 22.5* 24.8*  MCV 99.2 100.4* 100.9* 101.8* 100.4*  PLT 280 265 231 216 227   Basic Metabolic Panel: Recent Labs  Lab 07/28/21 0346 07/29/21 0620 07/30/21 0257 07/31/21 0445 08/01/21 0350  NA 140 138 137 138 139  K 3.7 3.8 3.9 3.9 4.0  CL 109 108 107 109 108  CO2 21* 20* 20* 20* 22  GLUCOSE 87 95 81 103* 89  BUN 25* 24* 32* 30* 25*  CREATININE 0.78 0.72 0.76 0.72 0.69  CALCIUM 8.4* 8.5* 8.2* 8.3* 8.8*   GFR: Estimated Creatinine Clearance: 66.4 mL/min (by C-G formula based on SCr of 0.69 mg/dL). Liver Function Tests: Recent Labs  Lab 07/28/21 0346  AST 16  ALT 5  ALKPHOS 122  BILITOT 1.1  PROT 6.7  ALBUMIN 1.7*   No results for input(s): LIPASE, AMYLASE in the last 168 hours. No results for input(s): AMMONIA in the last 168 hours. Coagulation Profile: No results for input(s): INR, PROTIME in the last 168 hours. Cardiac Enzymes: No results for input(s): CKTOTAL, CKMB, CKMBINDEX, TROPONINI in the last 168 hours. BNP (last 3 results) No results for input(s): PROBNP in the last 8760 hours. HbA1C: No results for input(s): HGBA1C in the last 72 hours.  CBG: Recent Labs  Lab 07/31/21 1556 07/31/21 2028 08/01/21 0004 08/01/21 0356 08/01/21 0844  GLUCAP 82 100* 106* 80 83   Lipid Profile: No results for input(s): CHOL, HDL, LDLCALC, TRIG, CHOLHDL, LDLDIRECT in the last 72 hours.  Thyroid Function Tests: No results for input(s): TSH, T4TOTAL, FREET4, T3FREE, THYROIDAB in the last 72 hours. Anemia Panel: No results for input(s): VITAMINB12, FOLATE, FERRITIN, TIBC, IRON, RETICCTPCT in the last 72 hours. Sepsis  Labs: No results for input(s): PROCALCITON, LATICACIDVEN in the last 168 hours.   Recent Results (from the past 240 hour(s))  Urine Culture     Status: Abnormal   Collection Time: 07/22/21  5:37 PM   Specimen: Urine, Catheterized  Result Value Ref Range Status   Specimen Description URINE, CATHETERIZED  Final   Special Requests   Final    NONE Performed at [facility], [address] [place], [place] [address]    Culture (A)  Final    30,000 COLONIES/mL KLEBSIELLA ORNITHINOLYTICA 40,000 COLONIES/mL YEAST    Report Status 07/25/2021 FINAL  Final   Organism ID, Bacteria KLEBSIELLA ORNITHINOLYTICA (A)  Final      Susceptibility   Klebsiella ornithinolytica - MIC*    AMPICILLIN >=32 RESISTANT Resistant     CEFAZOLIN >=64 RESISTANT Resistant     CEFEPIME >=32 RESISTANT Resistant     CEFTRIAXONE >=64 RESISTANT Resistant     CIPROFLOXACIN 1 SENSITIVE Sensitive     GENTAMICIN >=16 RESISTANT Resistant     IMIPENEM <=0.25 SENSITIVE Sensitive     NITROFURANTOIN 64 INTERMEDIATE Intermediate     TRIMETH/SULFA >=320 RESISTANT Resistant     AMPICILLIN/SULBACTAM >=32 RESISTANT Resistant     PIP/TAZO 32 INTERMEDIATE Intermediate     * 30,000 COLONIES/mL KLEBSIELLA ORNITHINOLYTICA      Radiology Studies: No results found.   LOS: 12 days   Lanae Boast, MD [facility]  08/01/2021, 9:41 AM

## 2021-08-02 LAB — GLUCOSE, CAPILLARY
Glucose-Capillary: 105 mg/dL — ABNORMAL HIGH (ref 70–99)
Glucose-Capillary: 73 mg/dL (ref 70–99)
Glucose-Capillary: 74 mg/dL (ref 70–99)
Glucose-Capillary: 75 mg/dL (ref 70–99)
Glucose-Capillary: 80 mg/dL (ref 70–99)
Glucose-Capillary: 91 mg/dL (ref 70–99)
Glucose-Capillary: 93 mg/dL (ref 70–99)

## 2021-08-02 NOTE — Progress Notes (Addendum)
Physical Therapy Treatment Patient Details Name: Kristy [name] MRN: [id number] DOB: 08-Aug-1946 Today's Date: 08/02/2021   History of Present Illness 75 y.o. woman who presented to the ED from SNF 9/11 due to increased confusion and SOB. MRI (+) pontine infarct. Concern for Pneumonia and probably CAUTI. 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 PMH: HTN, polycthemia, alcohol use.    PT Comments    Pt received up on bed pan, agreeable to PT session with encouragement and noted to be delirious, needing frequent reorientation to task/situation. Pt needing increased time/multimodal cues for sequencing simple mobility tasks for rolling to L/R sides for clean-up and able to follow some commands for UE/LE exercises and gentle cervical AROM within tolerance. Pt performed rolls with up to maxA and able to assist with posterior scooting in supine toward HOB with BLE when cued. Pt continues to benefit from PT services to progress toward functional mobility goals.    Recommendations for follow up therapy are one component of a multi-disciplinary discharge planning process, led by the attending physician.  Recommendations may be updated based on patient status, additional functional criteria and insurance authorization.  Follow Up Recommendations  SNF;Supervision/Assistance - 24 hour     Equipment Recommendations  Other (comment) (TBA)    Recommendations for Other Services       Precautions / Restrictions  Precautions Precautions: Fall Precaution Comments: PEG; indwelling foley Restrictions Weight Bearing Restrictions: No     Mobility  Bed Mobility Overal bed mobility: Needs Assistance Bed Mobility: Rolling Rolling: Max assist         General bed mobility comments: Pt received on BSC, pt able to initiate roll, needing maxA to bring her hips and BLE over to the side. Unable to maintain roll on L side, able to maintain roll on R side.    Transfers                 General transfer comment: Deferred due to BM and clean up this session  Ambulation/Gait                 Stairs             Wheelchair Mobility    Modified Rankin (Stroke Patients Only)       Balance Overall balance assessment: Needs assistance     Sitting balance - Comments: Deferred this session due to BM and clean up                                    Cognition Arousal/Alertness: Awake/alert Behavior During Therapy: Impulsive;Restless Overall Cognitive Status: Impaired/Different from baseline Area of Impairment: Orientation;Attention;Memory;Following commands;Safety/judgement;Awareness;Problem solving                 Orientation Level: Disoriented to;Place;Time;Situation Current Attention Level: Focused;Sustained Memory: Decreased recall of precautions;Decreased short-term memory Following Commands: Follows one step commands with increased time Safety/Judgement: Decreased awareness of safety;Decreased awareness of deficits Awareness: Intellectual Problem Solving: Slow processing;Decreased initiation;Difficulty sequencing;Requires verbal cues;Requires tactile cues General  Comments: Pt delirious this session, seeing people climbing in the room, stating that someone is accusing her of getting them pregnant, then by the end of the session reporting that she is pregnant. Therapists attempting to redirect pt throughout session.      Exercises General Exercises -  Lower Extremity Ankle Circles/Pumps: AAROM;Both;AROM;10 reps;Supine Heel Slides: AAROM;Both;10 reps;Supine Other Exercises Other Exercises: working on flexing neck x5 Other Exercises: Pulling herself to both sides x2 Other Exercises: reaching RUE forward x5 Other Exercises: cervical rotation to L x 5 reps Other Exercises: BUE pushing/pulling on bed rail while in sidelying for clean-up x5-10 reps    General Comments General comments (skin integrity, edema, etc.): Pt with continued itching, rash which appears to have spread over legs/more prominent over knees this date since previous session. Mitts donned after rolling to protect pt skin. VSS on RA      Pertinent Vitals/Pain Pain Assessment: Faces Faces Pain Scale: Hurts little more Pain Location: Neck and R toes when putting on socks Pain Descriptors / Indicators: Discomfort;Grimacing Pain Intervention(s): Limited activity within patient's tolerance;Monitored during session;Repositioned    Home Living                      Prior Function            PT Goals (current goals can now be found in the care plan section) Acute Rehab PT Goals Patient Stated Goal: To get walking and go home PT Goal Formulation: Patient unable to participate in goal setting Time For Goal Achievement: 08/05/21 Potential to Achieve Goals: Fair Progress towards PT goals: Progressing toward goals (slor progress)    Frequency    Min 2X/week      PT Plan Current plan remains appropriate    Co-evaluation              AM-PAC PT "6 Clicks" Mobility   Outcome Measure  Help needed turning from your back to your side while in a flat bed without using bedrails?: A Lot Help needed moving from lying on your back to sitting on the side of a flat bed without using bedrails?: Total Help needed moving to and from a bed to a chair (including a wheelchair)?: Total Help needed standing up from a chair using your arms (e.g., wheelchair or bedside  chair)?: Total Help needed to walk in hospital room?: Total Help needed climbing 3-5 steps with a railing? : Total 6 Click Score: 7    End of Session Equipment Utilized During Treatment: Other (comment) (transfer pads) Activity Tolerance: Other (comment) (limited due to need for bowel movement, pt more delirious and unable to follow commands safely for edge of bed) Patient left: in bed;with call bell/phone within reach;with bed alarm set;with SCD's reapplied;Other (comment) (B heels floated, HOB elevated >40 degrees, mitts on) Nurse Communication: Mobility status;Need for lift equipment;Other (comment) PT Visit Diagnosis: Other abnormalities of gait and mobility (R26.89);Muscle weakness (generalized) (M62.81);Pain;History of falling (Z91.81);Adult, failure to thrive (R62.7)     Time: 2703-5009 PT Time Calculation (min) (ACUTE ONLY): 14 min  Charges:  $Therapeutic Exercise: 8-22 mins                     Edelyn Heidel P., PTA Acute Rehabilitation Services Pager: [phone] Office: [phone]    [place] 08/02/2021, 3:51 PM

## 2021-08-02 NOTE — TOC Progression Note (Signed)
Transition of Care [facility]) - Progression Note    Patient Details  Name: Kristy [name] MRN: [id number] Date of Birth: [birth year]  [facility] CM/SW Contact  [name], [place] Phone Number: 08/02/2021, 2:47 PM  Clinical Narrative:    CSW followed up with the preferred SNF's none had any bed availibility. CSW updated daughter who noted she would review the other available facilities. CSW faxed pt out further and will follow up with daughter for choice. TOC will continue to follow.   Expected Discharge Plan: Skilled Nursing Facility Barriers to Discharge: Continued Medical Work up, SNF Pending bed offer  Expected Discharge Plan and Services Expected Discharge Plan: Skilled Nursing Facility     Post Acute Care Choice: Skilled Nursing Facility Living arrangements for the past 2 months: Skilled Nursing Facility                                       Social Determinants of Health (SDOH) Interventions    Readmission Risk Interventions No flowsheet data found.

## 2021-08-02 NOTE — Consult Note (Signed)
Brief Psychiatry Consult Note  The patient was last seen by the psychiatry service on 07/30/2021. Interim documentation by primary team and nursing staff has been reviewed - patient's mentation continues to wax and wane but has not been aggressive and is following commands/participating in care. At this time, patient is delirious without ongoing behavioral disturbance and there is no evidence of acute psychiatric disturbance requiring ongoing psychiatric consultation. Please see last consult note for full assessment; note that the standard treatment of delirium is to treat underlying medical conditions (infection) and that it can take some time to resolve in the elderly. Final medication recommendations are as follows:  -Continue Zyprexa to 2.5 mg PO qAM and 5 mg PO QHS -Continue Melatonin 3 mg at 6 PM -Continue Multivitamin daily -Continue Delirium Precautions  We will sign off at this time. This has been communicated to the primary team. If issues arise in the future, don't hesitate to contact the Psychiatry Inpatient Consult Service (pager [phone]).   Kristy [name]

## 2021-08-02 NOTE — Progress Notes (Signed)
PROGRESS NOTE    GENESA ARGENTO  TIW:[id number] DOB: [birth year] DOA: 07/20/2021 PCP: Darrow Bussing, MD   Brief Narrative: 82 old female with hypertension, polycythemia, hypertension alcohol use disorder two 12 ounce beer daily, recently admitted and discharged from [facility]  06/10/21-07/11/21 for 2 mechanical falls  and back pain at home-chest x-ray CT head C-spine unremarkable on admission, but had leukocytosis AKI abnormal LFTs urine culture with staph aureus blood culture was positive-and on further work-up found to have cervical epidural abscess C2-T3/4 status post ACDF on 06/16/2021 by  Dr Maurice Small +MSSA and was discharged on IV cefazolin and rifampin through 08/11/2021 and hospital course complicated by acute encephalopathy, dysphagia needing PEG tube, had recurrent urinary retention/UTI-Foley catheter was placed and discharged to SNF.  Readmitted from the nursing facility due to shortness of breath, more confusion than usual baseline .  In the ED:CT head showed acute/subacute possible pontine infarct, CT abdomen pelvis showed dystrophic changes inferior endplate of T12, L1 vertebral body, narrowing of the intervening intervertebral disc and a small amount of soft tissue thickening anteriorly, new finding compared to previous CTs and may represent sequelae of discitis/osteomyelitis, MRI lumbar C-spine was ordered and lab work showed leukocytosis 12.5, CT chest small amount of airspace consolidation versus atelectasis in the bilateral lung bases. ED called [facility] per daughter request but no bed, then patient was admitted MRI brain positive for 1.5 cm? acute to subacute ischemic nonhemorrhagic central pontine infarct. MRI C-spine and L spine done: Referto the report for more detail but shows -vertebral -cervical/lumbar osteomyelitis with epidural abscesses, paraspinal muscle abscesses.  9/11 Neurosurgery, ID, neurology was consulted-Neurosurgery did not advise acute surgical intervention/ I&D.   SLP-started pured diet, was n.p.o. plus tube feed PTA 9/13:Repeat MRI brain w/ contrast:no intracranial focus of abnormal contrast enhancement. 9/14 ID recommended to continue with cefazolin through 08/25/2021, outpatient ID follow-up, overall treatment failure in the setting of hardware is higher especially in the setting of Staph aureus  Neurology and ID signed off, neurosurgery again saw on  9/15 and signed off. 9/19-psychiatry consulted-meds as adjusted and patient no more verbally abusive and mentation much better on 9/20 Patient has been calm, mentation waxing and waning but able to follow simple commands  Subjective: Seen this morning alert awake able to tell me her name her date of birth appears calm.  Able to move all extremities but weak on upper extremities. 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  Seen by ID, neurosurgery-did not advise acute surgical intervention. Blood culture no growth this admission.I D plans to continue Cefazolin through 08/25/2021 w/ PICC line in place but overall treatment failure in the setting of hardware is higher especially in the setting of Staph aureus.  Acute Metabolic encephalopathy: On last discharge mental state had cleared some on 9/1 but he still had intermittent confusion was able to follow simple commands.suspecting multifactorial in the setting of her persistent spine infection and deconditioning, history of alcohol abuse, now with subacute pontine infarct. S/P High-dose thiamine.Seen by neurology.Psychiatry consulted 9/19 as patient was verbally abusing -added Zyprexa 2.5 mg a.m. and now more calm.pleasantly confused confused but follows simple  commands.minimize benzodiazepine Ativan antihistaminic and sedatives as much as possible. Prn Atarax dose decreased to 12.5 g continue on PTOT, SLP,  RD follow-up and supportive care .   Subacute pontine infarct: nocontrast MRI 1.5 cm 2/2 subacute ischemic likely affecting multiple penetrating arteries due to small vessel disease.MRI brain with contrast repeated 9/12-no intracranial focus of abnormal contrast-enhancement.  Discussed with neurologist. Completed stroke w/u with up with A1c 5.1 , lipid panel  ldl 88, goal < 70,CT angio head and neck no LVO. Echo with EF 60 to 65%, no R WMA, normal mitral valve, aortic valve with mild AR. Cont  ASA 81 mg, Lipitor 40 mg.  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  No need of further antibiotics continue cefazolin. ?  Colonization Continue Foley care, continue Flomax monitor urine output and renal function intermittently. Recent Labs  Lab 07/28/21 0346 07/29/21 0620 07/30/21 0257 07/31/21 0445 08/01/21 0350  BUN 25* 24* 32* 30* 25*  CREATININE 0.78 0.72 0.76 0.72 0.69    Diarrhea/loose stool: needed rectal tube briefly but resolved after changing Osmolite to  Jevity.  Continue probiotics while on antibiotics.   Hypokalemia resolved.  CT w/ Bilateral lung base consolidation/atelectasis-no respiratory symptoms, do not suspect pneumonia.  Encourage incentive spirometry  Moderate Protein Calorie malnutrition: Hypoglycemia: Hypoalbuminemia: TF feeding changed to Jevity due to diarrhea.  Encourage oral intake, augment  nutritional status intermittently low noted y9/20 12 pm- monitor cbg. Cont TF Recent Labs  Lab 08/01/21 1530 08/01/21 2013 08/02/21 0026 08/02/21 0413 08/02/21 0723  GLUCAP 92 97 91 105* 75     Mildly elevated bilirubin:Monitor.  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  We will cut down dose of atarax as it  may be worsening her mental status , On prn Benadryl, skin cream.She has had this skin condition for few years. Rifampin stopped in case it was making the rash worse. Saw dermatology before.Will need dermatology follow up, ? biopsy  as OP.   Hypertension hx: BP stable, was on midodrine PTA-discontinued and  BP doing well.  Chronic anemia Epistaxis on last admission-needing blood transfusion Monitor transfuse if less than 7 g-hemoglobin slowly downtrending no obvious blood loss noted.  May need PRBC transfusion-type and screened, transfuse if less than 7 g.  Recent Labs  Lab 07/28/21 0346 07/29/21 0620 07/30/21 0257 07/31/21 0445 08/01/21 0350  HGB 7.7* 8.1* 7.2* 7.0* 7.8*  HCT 24.5* 25.7* 22.9* 22.5* 24.8*   QMG:[id number] full code.Given her infectious process, encephalopathy, co-morbidities palliative care consulted.Patient daughter claims patient was functional prior to last hospital admission and requesting full aggressive care at this time.PMT involved, overall prognosis does not appear bright. Seen by multiple consults, attempted transfer to [facility] but was declined.  Plan is for SNF.Attempted transfer EDP called [facility] but no beds so admitted to [facility]. Per daughter request called [facility] transfer line 9/12 evening and was  called by by Dr Collie Siad and NOT accepted for transfer. Diet Order             DIET DYS 3 Room service appropriate? Yes with Assist; Fluid consistency: Thin  Diet effective now                 Patient's There is no height or weight on file to calculate BMI.  DVT prophylaxis:  enoxaparin (LOVENOX) injection  40 mg Start: 07/23/21 1545 SCDs Start: 07/21/21 0106. Code Status:   Code Status: Full Code  Family Communication: plan of care discussed with husband and daughter along with ID palliative care at bedside 9/12, multiple calls made since then.  TOC invilved and communicating with daughter. She is aware abt plan for SNF and cont on iv antibiotics, ptot supportive care.  Status TD:DUKGURKYH Remains inpatient appropriate because:Ongoing diagnostic testing needed not appropriate for outpatient work up, IV treatments appropriate due to intensity of illness or inability to take PO, and Inpatient level of care appropriate due to severity of illness  Dispo:The patient is from:SNF [facility].             Anticipated d/c is to:SNF,daughter prefers to go to [place] and not back to [place]. TOC consulted-pending bed offers.            Patient currently:stable.            Difficult to place patient No Unresulted Labs (From admission, onward)     Start     Ordered   07/31/21 0500  Basic metabolic panel  Daily,   R     Question:  Specimen collection method  Answer:  Unit=Unit collect   07/30/21 1119   07/31/21 0500  CBC  Daily,   R     Question:  Specimen collection method  Answer:  Unit=Unit collect   07/30/21 1119          Medications reviewed: Scheduled Meds:  acetaminophen  500 mg Per Tube Q8H   allopurinol  100 mg Per Tube Daily   aspirin  81 mg Per Tube Daily   atorvastatin  40 mg Per Tube QHS   Chlorhexidine Gluconate Cloth  6 each Topical Daily   diclofenac Sodium  4 g Topical TID AC & HS   enoxaparin (LOVENOX) injection  40 mg Subcutaneous Q24H   feeding supplement (PROSource TF)  45 mL Per Tube TID   free water  200 mL Per Tube Q4H   geriatric multivitamins-minerals  15 mL Oral Daily   lubriderm seriously sensitive   Topical BID   melatonin  3 mg Oral QHS   metoprolol tartrate  25 mg Per Tube BID   OLANZapine  2.5 mg Per Tube Daily   OLANZapine  5  mg Per Tube QHS   potassium chloride  20 mEq Per Tube Daily   saccharomyces boulardii  250 mg Per Tube BID   sodium chloride flush  10-40 mL Intracatheter Q12H   thiamine  100 mg Per Tube Q8H   triamcinolone 0.1 % cream : eucerin   Topical BID   Continuous Infusions:   ceFAZolin (ANCEF) IV 2 g (08/02/21 0529)   feeding supplement (JEVITY 1.5 CAL/FIBER) 1,000 mL (08/01/21 0912)   Consultants:see note  Procedures:see note Antimicrobials: Anti-infectives (From admission, onward)    Start     Dose/Rate Route Frequency Ordered Stop   07/24/21 1400  ceFAZolin (ANCEF) IVPB 2g/100 mL premix        2 g 200 mL/hr over 30 Minutes Intravenous Every 8 hours 07/24/21 1150 08/25/21 1359   07/22/21 2200  nafcillin 2 g in sodium chloride 0.9 % 100 mL IVPB  Status:  Discontinued        2 g 200 mL/hr over 30 Minutes Intravenous Every 4 hours 07/22/21 1320 07/24/21 1150   07/22/21 1215  vancomycin (VANCOREADY) IVPB 1250 mg/250 mL  Status:  Discontinued        1,250 mg 166.7  mL/hr over 90 Minutes Intravenous Every 24 hours 07/22/21 1120 07/22/21 1320   07/21/21 1700  rifampin (RIFADIN) 300 mg in sodium chloride 0.9 % 100 mL IVPB  Status:  Discontinued        300 mg 200 mL/hr over 30 Minutes Intravenous Every 12 hours 07/21/21 1327 07/22/21 1014   07/21/21 1600  nafcillin 2 g in sodium chloride 0.9 % 100 mL IVPB  Status:  Discontinued        2 g 200 mL/hr over 30 Minutes Intravenous Every 4 hours 07/21/21 1455 07/22/21 1014   07/21/21 1330  nafcillin injection 2 g  Status:  Discontinued        2 g Intravenous Every 4 hours 07/21/21 1326 07/21/21 1455   07/21/21 0000  vancomycin (VANCOCIN) IVPB 1000 mg/200 mL premix  Status:  Discontinued        1,000 mg 200 mL/hr over 60 Minutes Intravenous Every 12 hours 07/20/21 2156 07/21/21 1326   07/20/21 2200  ceFEPIme (MAXIPIME) 2 g in sodium chloride 0.9 % 100 mL IVPB  Status:  Discontinued        2 g 200 mL/hr over 30 Minutes Intravenous Every 8 hours  07/20/21 2143 07/21/21 1326   07/20/21 2145  vancomycin (VANCOREADY) IVPB 1750 mg/350 mL        1,750 mg 175 mL/hr over 120 Minutes Intravenous  Once 07/20/21 2143 07/21/21 0308      Culture/Microbiology    Component Value Date/Time   SDES URINE, CATHETERIZED 07/22/2021 1737   SPECREQUEST  07/22/2021 1737    NONE Performed at [facility], [address] [place], [place] [address]    CULT (A) 07/22/2021 1737    30,000 COLONIES/mL KLEBSIELLA ORNITHINOLYTICA 40,000 COLONIES/mL YEAST    REPTSTATUS 07/25/2021 FINAL 07/22/2021 1737    Other culture-see note  Objective: Vitals: Today's Vitals   08/01/21 1600 08/01/21 1949 08/02/21 0027 08/02/21 0414  BP:  (!) 155/77 133/82 (!) 140/105  Pulse:  (!) 106 96 (!) 102  Resp:  20 17 19   Temp:  99.1 F (37.3 C) 97.9 F (36.6 C) 98.9 F (37.2 C)  TempSrc:  Oral Oral   SpO2:  100% 100% 99%  PainSc: 0-No pain       Intake/Output Summary (Last 24 hours) at 08/02/2021 0737 Last data filed at 08/02/2021 0414 Gross per 24 hour  Intake 100 ml  Output 1900 ml  Net -1800 ml    There were no vitals filed for this visit. Weight change:   Intake/Output from previous day: 09/22 0701 - 09/23 0700 In: 100 [P.O.:100] Out: 1900 [Urine:1900] Intake/Output this shift: No intake/output data recorded. There were no vitals filed for this visit.  Examination: General exam:AAOx self, dob, confused abt locatin, follows commands.  HEENT:Oral mucosa moist, Ear/Nose WNL grossly, dentition normal. Respiratory system: bilaterally clear,no use of accessory muscle Cardiovascular system: S1 & S2 +, No JVD,. Gastrointestinal system: Abdomen soft, PEG+NT,ND, BS+ Nervous System:Alert, awake, moving LE well, moves UE but weak b/l Extremities:No edema, distal peripheral pulses palpable.  Skin: hyperpigmented, hyperkeratotic,distal LE b/l and chronic skin changes  with scratch marks 10/23 muscle bulk,tone, power   Data Reviewed: I have  personally reviewed following labs and imaging studies CBC: Recent Labs  Lab 07/28/21 0346 07/29/21 0620 07/30/21 0257 07/31/21 0445 08/01/21 0350  WBC 13.3* 11.5* 10.9* 9.6 11.6*  HGB 7.7* 8.1* 7.2* 7.0* 7.8*  HCT 24.5* 25.7* 22.9* 22.5* 24.8*  MCV 99.2 100.4* 100.9* 101.8* 100.4*  PLT 280  265 231 216 227    Basic Metabolic Panel: Recent Labs  Lab 07/28/21 0346 07/29/21 0620 07/30/21 0257 07/31/21 0445 08/01/21 0350  NA 140 138 137 138 139  K 3.7 3.8 3.9 3.9 4.0  CL 109 108 107 109 108  CO2 21* 20* 20* 20* 22  GLUCOSE 87 95 81 103* 89  BUN 25* 24* 32* 30* 25*  CREATININE 0.78 0.72 0.76 0.72 0.69  CALCIUM 8.4* 8.5* 8.2* 8.3* 8.8*    GFR: Estimated Creatinine Clearance: 66.4 mL/min (by C-G formula based on SCr of 0.69 mg/dL). Liver Function Tests: Recent Labs  Lab 07/28/21 0346  AST 16  ALT 5  ALKPHOS 122  BILITOT 1.1  PROT 6.7  ALBUMIN 1.7*    No results for input(s): LIPASE, AMYLASE in the last 168 hours. No results for input(s): AMMONIA in the last 168 hours. Coagulation Profile: No results for input(s): INR, PROTIME in the last 168 hours. Cardiac Enzymes: No results for input(s): CKTOTAL, CKMB, CKMBINDEX, TROPONINI in the last 168 hours. BNP (last 3 results) No results for input(s): PROBNP in the last 8760 hours. HbA1C: No results for input(s): HGBA1C in the last 72 hours.  CBG: Recent Labs  Lab 08/01/21 1530 08/01/21 2013 08/02/21 0026 08/02/21 0413 08/02/21 0723  GLUCAP 92 97 91 105* 75    Lipid Profile: No results for input(s): CHOL, HDL, LDLCALC, TRIG, CHOLHDL, LDLDIRECT in the last 72 hours.  Thyroid Function Tests: No results for input(s): TSH, T4TOTAL, FREET4, T3FREE, THYROIDAB in the last 72 hours. Anemia Panel: No results for input(s): VITAMINB12, FOLATE, FERRITIN, TIBC, IRON, RETICCTPCT in the last 72 hours. Sepsis Labs: No results for input(s): PROCALCITON, LATICACIDVEN in the last 168 hours.   No results found for this or  any previous visit (from the past 240 hour(s)).     Radiology Studies: No results found.   LOS: 13 days   Lanae Boast, MD [facility]  08/02/2021, 7:37 AM

## 2021-08-02 NOTE — Progress Notes (Signed)
Palliative Medicine RN Note: Discussed pt in team rounds.  Family has been very clear in GOC. Note that TOC is trying to find a facility that family will accept. PMT does not have more to offer at this time.  If the family requests to speak to [place], please re-consult and call our office at [phone]. We will otherwise sign off.  Margret Chance Fabiha Rougeau, RN, BSN, [facility] Palliative Medicine Team 08/02/2021 10:14 AM Office [phone]

## 2021-08-02 NOTE — Progress Notes (Signed)
Occupational Therapy Treatment Patient Details Name: Kristy [name] MRN: [id number] DOB: 04-26-1946 Today's Date: 08/02/2021   History of present illness 75 y.o. woman who presented to the ED from SNF 9/11 due to increased confusion and SOB. MRI (+) pontine infarct. Concern for Pneumonia and probably CAUTI. 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 Previously admitted to [facility] 8/1 due to a fall and underwent ACDF at C5-6 (06/16/2021) Hospital course complicated by osteomyelitis discitis at C5-T1, epidural abscess from C4-C7, encephalopathy and dysphagia/PEG tube placed, recurrent urinary retention. PMH: HTN, polycthemia, alcohol use.   OT comments  Pt making incremental progress this session. She is following simple commands more consistently, working on turning her head to the L and flexing her neck down. This session focused on bed mobility for BM and clean up. Pt continues to require max/mod A +2 for rolling and LB bathing. OT will continue to follow up as time allows.    Recommendations for follow up therapy are one component of a multi-disciplinary discharge planning process, led by the attending physician.  Recommendations may be updated based on patient status, additional functional criteria and insurance authorization.    Follow Up Recommendations  SNF    Equipment Recommendations  None recommended by OT    Recommendations for Other Services      Precautions / Restrictions Precautions Precautions: Fall Restrictions Weight Bearing Restrictions: No       Mobility Bed Mobility Overal bed mobility: Needs Assistance Bed Mobility: Rolling Rolling: Max assist         General bed mobility comments: Pt able to initiate roll, needing max a  to bring her hips and BLE over to the side. Unable to maintain roll on L side, able to maintain roll on R side.    Transfers                 General transfer comment: Deferred due to [facility] clean up this session    Balance Overall balance assessment: Needs assistance     Sitting balance - Comments: Deferred this session due to BM and clean up                                   ADL either performed or assessed with clinical judgement   ADL Overall ADL's : Needs assistance/impaired                                       General ADL Comments: Pt completeing bed mobility to use bed pain, working on ROM of BUE and mobilizing her neck     Vision       Perception     Praxis      Cognition Arousal/Alertness: Awake/alert Behavior During Therapy: Impulsive;Restless Overall Cognitive Status: Impaired/Different from baseline Area of Impairment: Orientation;Attention;Memory;Following commands;Safety/judgement;Awareness;Problem solving                 Orientation Level: Disoriented to;Place;Time;Situation Current Attention Level: Focused;Sustained Memory: Decreased recall of precautions;Decreased short-term memory Following Commands: Follows one step commands with increased time Safety/Judgement: Decreased awareness of safety;Decreased awareness of deficits Awareness: Intellectual Problem Solving: Slow processing;Decreased  initiation;Difficulty sequencing;Requires verbal cues;Requires tactile cues General Comments: Pt delirious this session, seeing people climbing in the room, stating that someone is accusing her of getting them pregnant, then by the end of the session reporting that she is pregnant. Therapists attempting to redirect pt throughout session.        Exercises Exercises: Other exercises Other Exercises Other Exercises: working on flexing neck x5 Other Exercises: Pulling herself to both sides x2 Other Exercises: reaching RUE  forward x5   Shoulder Instructions       General Comments Pt skin continues to be itchy and covered in a rash, pt reports the rash is all over.    Pertinent Vitals/ Pain       Pain Assessment: Faces Faces Pain Scale: Hurts little more Pain Location: Neck and R toes when putting on socks Pain Descriptors / Indicators: Discomfort;Grimacing Pain Intervention(s): Limited activity within patient's tolerance;Monitored during session;Repositioned  Home Living                                          Prior Functioning/Environment              Frequency  Min 2X/week        Progress Toward Goals  OT Goals(current goals can now be found in the care plan section)  Progress towards OT goals: Progressing toward goals  Acute Rehab OT Goals Patient Stated Goal: To get walking and go home OT Goal Formulation: With patient/family Time For Goal Achievement: 08/05/21 Potential to Achieve Goals: Fair ADL Goals Pt Will Perform Eating: with mod assist;with adaptive utensils;sitting Pt Will Perform Grooming: with max assist;bed level Pt Will Perform Upper Body Bathing: with max assist;bed level Pt Will Transfer to Toilet: with +2 assist;with mod assist;bedside commode;ambulating Pt/caregiver will Perform Home Exercise Program: Increased ROM;Both right and left upper extremity;With written HEP provided;Independently Additional ADL Goal #1: Pt will tolerate HOB @ 60 x 20 min to increase tolerance for upright sitting Additional ADL Goal #2: Pt will complete hand to mouth pattern with Max A to increase independence with grooming Additional ADL Goal #3: Pt will complete hand to mouth pattern with BUE and Mod A in prep for self-feeding.  Plan Discharge plan remains appropriate;Frequency remains appropriate    Co-evaluation                 AM-PAC OT "6 Clicks" Daily Activity     Outcome Measure   Help from another person eating meals?: A Lot Help from another  person taking care of personal grooming?: A Lot Help from another person toileting, which includes using toliet, bedpan, or urinal?: Total Help from another person bathing (including washing, rinsing, drying)?: A Lot Help from another person to put on and taking off regular upper body clothing?: Total Help from another person to put on and taking off regular lower body clothing?: Total 6 Click Score: 9    End of Session    OT Visit Diagnosis: Unsteadiness on feet (R26.81);Other abnormalities of gait and mobility (R26.89);Muscle weakness (generalized) (M62.81);History of falling (Z91.81);Other symptoms and signs involving cognitive function;Pain Pain - Right/Left:  (B; all over) Pain - part of body: Shoulder;Arm (itching)   Activity Tolerance Patient tolerated treatment well   Patient Left in bed;with call bell/phone within reach;with bed alarm set   Nurse Communication Mobility status        Time: 1350-1400 OT Time  Calculation (min): 10 min  Charges: OT General Charges $OT Visit: 1 Visit OT Treatments $Self Care/Home Management : 8-22 mins  Alianah Lofton H., OTR/L Acute Rehabilitation  Avinash Maltos Elane Bing Plume 08/02/2021, 2:54 PM

## 2021-08-03 LAB — CBC
HCT: 24.5 % — ABNORMAL LOW (ref 36.0–46.0)
Hemoglobin: 7.7 g/dL — ABNORMAL LOW (ref 12.0–15.0)
MCH: 32.2 pg (ref 26.0–34.0)
MCHC: 31.4 g/dL (ref 30.0–36.0)
MCV: 102.5 fL — ABNORMAL HIGH (ref 80.0–100.0)
Platelets: 198 10*3/uL (ref 150–400)
RBC: 2.39 MIL/uL — ABNORMAL LOW (ref 3.87–5.11)
RDW: 19.2 % — ABNORMAL HIGH (ref 11.5–15.5)
WBC: 10.6 10*3/uL — ABNORMAL HIGH (ref 4.0–10.5)
nRBC: 0 % (ref 0.0–0.2)

## 2021-08-03 LAB — GLUCOSE, CAPILLARY
Glucose-Capillary: 94 mg/dL (ref 70–99)
Glucose-Capillary: 76 mg/dL (ref 70–99)
Glucose-Capillary: 99 mg/dL (ref 70–99)
Glucose-Capillary: 91 mg/dL (ref 70–99)
Glucose-Capillary: 91 mg/dL (ref 70–99)

## 2021-08-03 LAB — BASIC METABOLIC PANEL
Anion gap: 7 (ref 5–15)
BUN: 25 mg/dL — ABNORMAL HIGH (ref 8–23)
CO2: 24 mmol/L (ref 22–32)
Calcium: 8.7 mg/dL — ABNORMAL LOW (ref 8.9–10.3)
Chloride: 110 mmol/L (ref 98–111)
Creatinine, Ser: 0.72 mg/dL (ref 0.44–1.00)
GFR, Estimated: >60 mL/min (ref >60)
Glucose, Bld: 92 mg/dL (ref 70–99)
Potassium: 4.1 mmol/L (ref 3.5–5.1)
Sodium: 141 mmol/L (ref 135–145)

## 2021-08-03 MED ORDER — ALBUTEROL SULFATE (2.5 MG/3ML) 0.083% IN NEBU
INHALATION_SOLUTION | RESPIRATORY_TRACT | Status: AC
  Filled 2021-08-03: qty 3

## 2021-08-03 MED ORDER — ALBUTEROL SULFATE (2.5 MG/3ML) 0.083% IN NEBU
2.5000 mg | INHALATION_SOLUTION | Freq: Four times a day (QID) | RESPIRATORY_TRACT | Status: DC | PRN
  Administered 2021-08-03: 2.5 mg via RESPIRATORY_TRACT

## 2021-08-03 NOTE — Progress Notes (Signed)
Pharmacy Antibiotic Note  Kristy [name] is a 75 y.o. 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 Patient was initially re-started on nafcillin/rifampin due to cervical hardware and hx of MSSA and then switched to Vancomycin. With no growth on 9/10 blood cultures, patient was switched to cefazolin 9/14 (OPAT orders entered). Pharmacy has been consulted for vancomycin dosing. SCr stable at < 1. WBC 10.6 and patient afebrile   Plan: Continue Cefazolin 2g IV Q8H (OPAT orders entered)- continue until 08/25/21  Monitor renal function and clinical status    Temp (24hrs), Avg:97.9 F (36.6 C), Min:97.5 F (36.4 C), Max:99.2 F (37.3 C)  Recent Labs  Lab 07/29/21 0620 07/30/21 0257 07/31/21 0445 08/01/21 0350 08/03/21 0603  WBC 11.5* 10.9* 9.6 11.6* 10.6*  CREATININE 0.72 0.76 0.72 0.69 0.72     CrCl cannot be calculated (Unknown ideal weight.).    No Known Allergies    Thank you for allowing pharmacy to be a part of this patient's care.  Jani Gravel, PharmD PGY-1 Acute Care Resident  08/03/2021 11:26 AM

## 2021-08-03 NOTE — Progress Notes (Signed)
PROGRESS NOTE    Kristy [name]  EOF:[id number] DOB: 08/10/1946 DOA: 07/20/2021 PCP: Darrow Bussing, MD   Brief Narrative: 14 old female with hypertension, polycythemia, hypertension alcohol use disorder two 12 ounce beer daily, recently admitted and discharged from [facility]  06/10/21-07/11/21 for 2 mechanical falls  and back pain at home-chest x-ray CT head C-spine unremarkable on admission, but had leukocytosis AKI abnormal LFTs urine culture with staph aureus blood culture was positive-and on further work-up found to have cervical epidural abscess C2-T3/4 status post ACDF on 06/16/2021 by  Dr Maurice Small +MSSA and was discharged on IV cefazolin and rifampin through 08/11/2021 and hospital course complicated by acute encephalopathy, dysphagia needing PEG tube, had recurrent urinary retention/UTI-Foley catheter was placed and discharged to SNF. Readmitted from the nursing facility due to shortness of breath, more confusion than usual baseline .  In the ED:CT head showed acute/subacute possible pontine infarct, CT abdomen pelvis showed dystrophic changes inferior endplate of T12, L1 vertebral body, narrowing of the intervening intervertebral disc and a small amount of soft tissue thickening anteriorly, new finding compared to previous CTs and may represent sequelae of discitis/osteomyelitis, MRI lumbar C-spine was ordered and lab work showed leukocytosis 12.5, CT chest small amount of airspace consolidation versus atelectasis in the bilateral lung bases. ED called [facility] per daughter request but no bed, then patient was admitted MRI brain positive for 1.5 cm? acute to subacute ischemic nonhemorrhagic central pontine infarct. MRI C-spine and L spine done: Referto the report for more detail but shows -vertebral -cervical/lumbar osteomyelitis with epidural abscesses, paraspinal muscle abscesses. 9/11 Neurosurgery, ID, neurology was consulted-Neurosurgery did not advise acute surgical intervention/ I&D. SLP-started  pured diet, was n.p.o. plus tube feed PTA 9/13:Repeat MRI brain w/ contrast:no intracranial focus of abnormal contrast enhancement. 9/14 ID recommended to continue with cefazolin through 08/25/2021, outpatient ID follow-up, overall treatment failure in the setting of hardware is higher especially in the setting of Staph aureus  Neurology and ID signed off, neurosurgery again saw on  9/15 and signed off. 9/19-psychiatry consulted-meds as adjusted and patient no more verbally abusive Patient remains confused, follows simple commands  Subjective: Seen and examined. Alert awake resting comfortably, mitts in place Overnight no fever.  Saturating well on room air. 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  Seen by ID, neurosurgery-did not advise acute surgical intervention. Blood culture no growth this admission. ID advised to continue Cefazolin through 08/25/2021 w/ PICC line  Overall treatment failure in the setting of hardware is higher especially in the setting of Staph aureus.  Acute Metabolic encephalopathy:multifactorial in the setting of her persistent spine infection and deconditioning, history of alcohol., now with subacute pontine infarct. S/P High-dose thiamine.on previous discharge mentation similar, pleasantly confused follows some commands.seen by neurology.  Psychiatry consulted 9/19 as patient was verbally abusing -added Zyprexa 2.5 mg a.m. continued on 5 mg nightly, melatonin nightly and appears more calm but still delirious.  Dose of prn atarax decreased to help with mentation  minimize sedatives.  Continue fall precaution, delirium precaution,  reorientation.  I wonder if this is her new baseline.  Subacute pontine infarct: nocontrast MRI 1.5 cm 2/2 subacute ischemic likely affecting multiple penetrating arteries due to small vessel disease.MRI brain with contrast repeated 9/12-no intracranial focus of abnormal contrast-enhancement.  Discussed with neurologist. Completed stroke w/u with up with A1c 5.1 , lipid panel  ldl 88, goal < 70,CT angio head and neck no LVO. Echo with EF 60 to 65%, no R WMA, normal mitral valve, aortic valve with mild AR. Cont  ASA 81 mg, Lipitor 40 mg.  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  Able to move lower extremities well has weakness in upper extremities bilaterally.  Continue PT OT.  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  No need of further antibiotics continue cefazolin. ?  Colonization Continue Foley care, continue Flomax monitor urine output and renal function intermittently. Recent Labs  Lab 07/28/21 0346 07/29/21 0620 07/30/21 0257 07/31/21 0445 08/01/21 0350  BUN 25* 24* 32* 30* 25*  CREATININE 0.78 0.72 0.76 0.72 0.69    Diarrhea/loose stool: needed rectal tube briefly but resolved after changing Osmolite to  Jevity.  Continue probiotics while on antibiotics.   Hypokalemia resolved.  CT w/ Bilateral lung base consolidation/atelectasis-no respiratory symptoms, do not suspect pneumonia.  Encourage incentive spirometry, unfortunately at risk of pneumonia due to her current situation   Moderate Protein Calorie malnutrition: Hypoglycemia: Hypoalbuminemia: Tolerating tube feeding  with Jevity .  SLP advanced to dysphagia 3 diet, augment nutritional status.   Recent Labs  Lab 08/02/21 1731 08/02/21 1928 08/02/21 2334 08/03/21 0400 08/03/21 0721  GLUCAP 80 74 93 94 76     Mildly elevated bilirubin:Monitor.  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  On prn atarax, benadryl and skin cream.Rifampin stopped in case it was making the rash worse. Saw dermatology before.Will need dermatology follow up, ? biopsy  as OP.   Hypertension hx: BP stable. as on midodrine PTA-discontinued and  BP doing well.  Chronic anemia Epistaxis on last admission-needing blood transfusion Monitor transfuse if less than 7 g- monitor Recent Labs  Lab 07/29/21 0620 07/30/21 0257 07/31/21 0445 08/01/21 0350 08/03/21 0603  HGB 8.1* 7.2* 7.0* 7.8* 7.7*  HCT 25.7* 22.9* 22.5* 24.8* 24.5*   BBU:[id number] full code.Given her infectious process, encephalopathy, co-morbidities palliative care consulted.Patient daughter claims patient was functional prior to last hospital admission and requesting full aggressive care at this time.PMT involved, overall prognosis does not appear bright. Seen by multiple consults.   EDP called [facility] but no beds so admitted to [facility]. Per daughter request called [facility] transfer line 9/12 evening and was  called by by Dr Collie Siad and NOT accepted for transfer. Diet Order             DIET DYS 3 Room service appropriate? Yes with Assist; Fluid consistency: Thin  Diet effective now                 Patient's There is no height or weight on file to calculate BMI.  DVT prophylaxis: enoxaparin (LOVENOX) injection 40 mg Start: 07/23/21 1545 SCDs Start: 07/21/21 0106. Code Status:   Code Status: Full Code  Family Communication: plan of care discussed with husband and daughter along with ID palliative care at bedside 9/12, multiple calls made since then.  TOC invilved and communicating with daughter. She  is aware  abt plan for SNF and cont on iv antibiotics, ptot supportive care.  Status ZY:[id number] Remains inpatient appropriate because:Ongoing diagnostic testing needed not appropriate for outpatient work up, IV treatments appropriate due to intensity of illness or inability to take PO, and Inpatient level of care appropriate due to severity of illness  Dispo:The patient is from:S[facility].             Anticipated d/c is to:SNF,daughter prefers to go to [facility] and not back to [facility]. TOC consulted-pending bed offers.            Patient currently:stable.            Difficult to place patient No Unresulted Labs (From admission, onward)     Start     Ordered   08/03/21 0500  Basic metabolic panel  Every 48 hours,   R (with TIMED occurrences)     Question:  Specimen collection method  Answer:  Unit=Unit collect   08/02/21 0745   08/03/21 0500  CBC  Every 48 hours,   R     Question:  Specimen collection method  Answer:  Unit=Unit collect   08/02/21 0745          Medications reviewed: Scheduled Meds:  acetaminophen  500 mg Per Tube Q8H   allopurinol  100 mg Per Tube Daily   aspirin  81 mg Per Tube Daily   atorvastatin  40 mg Per Tube QHS   Chlorhexidine Gluconate Cloth  6 each Topical Daily   diclofenac Sodium  4 g Topical TID AC & HS   enoxaparin (LOVENOX) injection  40 mg Subcutaneous Q24H   feeding supplement (PROSource TF)  45 mL Per Tube TID   free water  200 mL Per Tube Q4H   geriatric multivitamins-minerals  15 mL Oral Daily   lubriderm seriously sensitive   Topical BID   melatonin  3 mg Oral QHS   metoprolol tartrate  25 mg Per Tube BID   OLANZapine  2.5 mg Per Tube Daily   OLANZapine  5 mg Per Tube QHS   potassium chloride  20 mEq Per Tube Daily   saccharomyces boulardii  250 mg Per Tube BID   sodium chloride flush  10-40 mL Intracatheter Q12H   thiamine  100 mg Per Tube Q8H   triamcinolone 0.1 % cream : eucerin   Topical BID   Continuous Infusions:    ceFAZolin (ANCEF) IV 2 g (08/03/21 0506)   feeding supplement (JEVITY 1.5 CAL/FIBER) 1,000 mL (08/03/21 0506)   Consultants:see note  Procedures:see note Antimicrobials: Anti-infectives (From admission, onward)    Start     Dose/Rate Route Frequency Ordered Stop   07/24/21 1400  ceFAZolin (ANCEF) IVPB 2g/100 mL premix        2 g 200 mL/hr over 30 Minutes Intravenous Every 8 hours 07/24/21 1150 08/25/21 1359   07/22/21 2200  nafcillin 2 g in sodium chloride 0.9 % 100 mL IVPB  Status:  Discontinued        2 g 200 mL/hr over 30 Minutes Intravenous Every 4 hours 07/22/21 1320 07/24/21 1150   07/22/21 1215  vancomycin (VANCOREADY) IVPB 1250 mg/250 mL  Status:  Discontinued        1,250 mg 166.7 mL/hr over 90 Minutes Intravenous Every 24 hours 07/22/21 1120 07/22/21 1320   07/21/21 1700  rifampin (RIFADIN) 300 mg in sodium chloride 0.9 % 100 mL IVPB  Status:  Discontinued        300 mg 200 mL/hr  over 30 Minutes Intravenous Every 12 hours 07/21/21 1327 07/22/21 1014   07/21/21 1600  nafcillin 2 g in sodium chloride 0.9 % 100 mL IVPB  Status:  Discontinued        2 g 200 mL/hr over 30 Minutes Intravenous Every 4 hours 07/21/21 1455 07/22/21 1014   07/21/21 1330  nafcillin injection 2 g  Status:  Discontinued        2 g Intravenous Every 4 hours 07/21/21 1326 07/21/21 1455   07/21/21 0000  vancomycin (VANCOCIN) IVPB 1000 mg/200 mL premix  Status:  Discontinued        1,000 mg 200 mL/hr over 60 Minutes Intravenous Every 12 hours 07/20/21 2156 07/21/21 1326   07/20/21 2200  ceFEPIme (MAXIPIME) 2 g in sodium chloride 0.9 % 100 mL IVPB  Status:  Discontinued        2 g 200 mL/hr over 30 Minutes Intravenous Every 8 hours 07/20/21 2143 07/21/21 1326   07/20/21 2145  vancomycin (VANCOREADY) IVPB 1750 mg/350 mL        1,750 mg 175 mL/hr over 120 Minutes Intravenous  Once 07/20/21 2143 07/21/21 0308      Culture/Microbiology    Component Value Date/Time   SDES URINE, CATHETERIZED 07/22/2021  1737   SPECREQUEST  07/22/2021 1737    NONE Performed at [facility], [address] [place], [place] [address]    CULT (A) 07/22/2021 1737    30,000 COLONIES/mL KLEBSIELLA ORNITHINOLYTICA 40,000 COLONIES/mL YEAST    REPTSTATUS 07/25/2021 FINAL 07/22/2021 1737    Other culture-see note  Objective: Vitals: Today's Vitals   08/02/21 2335 08/03/21 0034 08/03/21 0119 08/03/21 0402  BP: (!) 152/87   (!) 136/96  Pulse: 89   82  Resp: 19   17  Temp: 97.8 F (36.6 C)   98.1 F (36.7 C)  TempSrc: Axillary   Oral  SpO2: 98%     PainSc:  6  Asleep     Intake/Output Summary (Last 24 hours) at 08/03/2021 0723 Last data filed at 08/03/2021 0700 Gross per 24 hour  Intake 860 ml  Output 1610 ml  Net -750 ml    There were no vitals filed for this visit. Weight change:   Intake/Output from previous day: 09/23 0701 - 09/24 0700 In: 860 [NG/GT:660; IV Piggyback:200] Out: 1610 [Urine:1610] Intake/Output this shift: No intake/output data recorded. There were no vitals filed for this visit.  Examination: General exam: AAOx 1-2,older than stated age, weak appearing. Mitten+ HEENT:Oral mucosa moist, Ear/Nose WNL grossly, dentition normal. Respiratory system: bilaterally clear, no use of accessory muscle Cardiovascular system: S1 & S2 +, No JVD,. Gastrointestinal system: Abdomen soft,PEG+ NT,ND, BS+ Nervous System:Alert, awake, moving extremities and grossly nonfocal Extremities: No edema, hyperkeratotic and hyperpigmented skin disease on the distal  legs bilaterally Skin: No rashes,no icterus. MSK: Normal muscle bulk,tone, power   Data Reviewed: I have personally reviewed following labs and imaging studies CBC: Recent Labs  Lab 07/29/21 0620 07/30/21 0257 07/31/21 0445 08/01/21 0350 08/03/21 0603  WBC 11.5* 10.9* 9.6 11.6* 10.6*  HGB 8.1* 7.2* 7.0* 7.8* 7.7*  HCT 25.7* 22.9* 22.5* 24.8* 24.5*  MCV 100.4* 100.9* 101.8* 100.4* 102.5*  PLT 265 231 216 227 198     Basic Metabolic Panel: Recent Labs  Lab 07/28/21 0346 07/29/21 0620 07/30/21 0257 07/31/21 0445 08/01/21 0350  NA 140 138 137 138 139  K 3.7 3.8 3.9 3.9 4.0  CL 109 108 107 109 108  CO2 21* 20* 20*  20* 22  GLUCOSE 87 95 81 103* 89  BUN 25* 24* 32* 30* 25*  CREATININE 0.78 0.72 0.76 0.72 0.69  CALCIUM 8.4* 8.5* 8.2* 8.3* 8.8*    GFR: CrCl cannot be calculated (Unknown ideal weight.). Liver Function Tests: Recent Labs  Lab 07/28/21 0346  AST 16  ALT 5  ALKPHOS 122  BILITOT 1.1  PROT 6.7  ALBUMIN 1.7*    No results for input(s): LIPASE, AMYLASE in the last 168 hours. No results for input(s): AMMONIA in the last 168 hours. Coagulation Profile: No results for input(s): INR, PROTIME in the last 168 hours. Cardiac Enzymes: No results for input(s): CKTOTAL, CKMB, CKMBINDEX, TROPONINI in the last 168 hours. BNP (last 3 results) No results for input(s): PROBNP in the last 8760 hours. HbA1C: No results for input(s): HGBA1C in the last 72 hours.  CBG: Recent Labs  Lab 08/02/21 1731 08/02/21 1928 08/02/21 2334 08/03/21 0400 08/03/21 0721  GLUCAP 80 74 93 94 76    Lipid Profile: No results for input(s): CHOL, HDL, LDLCALC, TRIG, CHOLHDL, LDLDIRECT in the last 72 hours.  Thyroid Function Tests: No results for input(s): TSH, T4TOTAL, FREET4, T3FREE, THYROIDAB in the last 72 hours. Anemia Panel: No results for input(s): VITAMINB12, FOLATE, FERRITIN, TIBC, IRON, RETICCTPCT in the last 72 hours. Sepsis Labs: No results for input(s): PROCALCITON, LATICACIDVEN in the last 168 hours.   No results found for this or any previous visit (from the past 240 hour(s)).     Radiology Studies: No results found.   LOS: 14 days   Lanae Boast, MD [facility]  08/03/2021, 7:23 AM

## 2021-08-04 LAB — GLUCOSE, CAPILLARY
Glucose-Capillary: 108 mg/dL — ABNORMAL HIGH (ref 70–99)
Glucose-Capillary: 70 mg/dL (ref 70–99)
Glucose-Capillary: 74 mg/dL (ref 70–99)
Glucose-Capillary: 78 mg/dL (ref 70–99)
Glucose-Capillary: 79 mg/dL (ref 70–99)
Glucose-Capillary: 92 mg/dL (ref 70–99)

## 2021-08-04 NOTE — Progress Notes (Signed)
Patient pulled PICC line out 42cm intact. Site bleeding. Vaseline gauze pressure dsg applied.Otilio Carpen RN picc is out

## 2021-08-04 NOTE — Progress Notes (Signed)
PROGRESS NOTE    Kristy [name]  BTD:[id number] DOB: 08-16-1946 DOA: 07/20/2021 PCP: Darrow Bussing, MD   Brief Narrative: 36 old female with hypertension, polycythemia, hypertension alcohol use disorder two 12 ounce beer daily, recently admitted and discharged from [facility]  06/10/21-07/11/21 for 2 mechanical falls  and back pain at home-chest x-ray CT head C-spine unremarkable on admission, but had leukocytosis AKI abnormal LFTs urine culture with staph aureus blood culture was positive-and on further work-up found to have cervical epidural abscess C2-T3/4 status post ACDF on 06/16/2021 by  Dr Maurice Small +MSSA and was discharged on IV cefazolin and rifampin through 08/11/2021 and hospital course complicated by acute encephalopathy, dysphagia needing PEG tube, had recurrent urinary retention/UTI-Foley catheter was placed and discharged to SNF. Readmitted from the nursing facility due to shortness of breath, more confusion than usual baseline .  In the ED:CT head showed acute/subacute possible pontine infarct, CT abdomen pelvis showed dystrophic changes inferior endplate of T12, L1 vertebral body, narrowing of the intervening intervertebral disc and a small amount of soft tissue thickening anteriorly, new finding compared to previous CTs and may represent sequelae of discitis/osteomyelitis, MRI lumbar C-spine was ordered and lab work showed leukocytosis 12.5, CT chest small amount of airspace consolidation versus atelectasis in the bilateral lung bases. ED called [facility] per daughter request but no bed, then patient was admitted MRI brain positive for 1.5 cm? acute to subacute ischemic nonhemorrhagic central pontine infarct. MRI C-spine and L spine done: Referto the report for more detail but shows -vertebral -cervical/lumbar osteomyelitis with epidural abscesses, paraspinal muscle abscesses. 9/11 Neurosurgery, ID, neurology was consulted-Neurosurgery did not advise acute surgical intervention/ I&D. SLP-started  pured diet, was n.p.o. plus tube feed PTA 9/13:Repeat MRI brain w/ contrast:no intracranial focus of abnormal contrast enhancement. 9/14 ID recommended to continue with cefazolin through 08/25/2021, outpatient ID follow-up, overall treatment failure in the setting of hardware is higher especially in the setting of Staph aureus  Neurology and ID signed off, neurosurgery again saw on  9/15 and signed off. 9/19-psychiatry consulted-meds as adjusted and patient no more verbally abusive Patient remains confused, follows simple commands  Subjective: Seen earlier today.  Somewhat verbally aggressive this morning has not received her a.m. Zyprexa.  Able to tell me her name, moving lower extremities well weak upper extremities.  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  Seen by ID, neurosurgery-did not advise acute surgical intervention. Blood culture no growth this admission. ID advised to continue Cefazolin through 08/25/2021 w/ PICC line  Overall treatment failure in the setting of hardware is higher especially in the setting of Staph aureus.  Acute Metabolic encephalopathy:multifactorial in the setting of her persistent spine infection and deconditioning, history of alcohol., now with subacute pontine infarct. S/P High-dose thiamine.on previous discharge mentation similar, pleasantly confused follows some commands.seen by neurology.  Psychiatry consulted 9/19 as patient was verbally abusing -added Zyprexa 2.5 mg a.m. continued on 5 mg nightly, melatonin nightly and appears more calm but still delirious.  Continue to monitor her mental status continue supportive care,. Prn atarax decreased to help with mentation , minimize sedatives.  Continue fall precaution,  delirium precaution,  reorientation.  I wonder if this is her new baseline.   Subacute pontine infarct: nocontrast MRI 1.5 cm 2/2 subacute ischemic likely affecting multiple penetrating arteries  due to small vessel disease.MRI brain with contrast repeated 9/12-no intracranial focus of abnormal contrast-enhancement.  Discussed with neurologist. Completed stroke w/u with up with A1c 5.1 , lipid panel  ldl 88, goal < 70,CT angio head and neck no LVO. Echo with EF 60 to 65%, no R WMA, normal mitral valve, aortic valve with mild AR. Cont  ASA 81 mg, Lipitor 40 mg.  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  Able to move lower extremities well has weakness in upper extremities bilaterally.  Continue PT OT.  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  No need of further antibiotics continue cefazolin. ?  Colonization.Continue Foley care, continue Flomax monitor urine output and renal function intermittently. Recent Labs  Lab 07/29/21 0620 07/30/21 0257 07/31/21 0445 08/01/21 0350 08/03/21 0603  BUN 24* 32* 30* 25* 25*  CREATININE 0.72 0.76 0.72 0.69 0.72    Diarrhea/loose stool: needed rectal tube briefly but resolved after changing Osmolite to  Jevity.  Continue probiotics while on antibiotics.   Hypokalemia resolved.  CT w/ Bilateral lung base consolidation/atelectasis-no respiratory symptoms, do not suspect pneumonia.  Encourage incentive spirometry, unfortunately at risk of pneumonia due to her current situation   Moderate Protein Calorie malnutrition: Hypoglycemia: Hypoalbuminemia: Tolerating tube feeding with Jevity .  SLP advanced to dysphagia 3 diet,  augment nutritional status.   Recent Labs  Lab 08/03/21 1620 08/03/21 2043 08/04/21 0024 08/04/21 0402 08/04/21 0757  GLUCAP 91 91 108* 74 78     Mildly elevated bilirubin:Monitor.  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  On prn atarax, benadryl and skin cream.Rifampin stopped in case it was making the rash worse. Saw dermatology before.Will need dermatology follow up, ? biopsy  as OP.   Hypertension hx: BP stable. as on midodrine PTA-discontinued this admission and blood pressure doing well   Chronic anemia Epistaxis on last admission-needing blood transfusion Monitor transfuse if less than 7 g- monitor.  Check CBC intermittently. Recent Labs  Lab 07/29/21 0620 07/30/21 0257 07/31/21 0445 08/01/21 0350 08/03/21 0603  HGB 8.1* 7.2* 7.0* 7.8* 7.7*  HCT 25.7* 22.9* 22.5* 24.8* 24.5*   YYT:[id number] full code.Given her infectious process, encephalopathy, co-morbidities palliative care consulted.Patient daughter claims patient was functional prior to last hospital admission and requesting full aggressive care at this time.PMT involved, overall prognosis does not appear bright. Seen by multiple consults.   EDP called [facility] but no beds so admitted to [facility]. Per daughter request called [facility] transfer line 9/12 evening and was  called by by Dr Collie Siad and NOT accepted for transfer. Diet Order             DIET DYS 3 Room service appropriate? Yes with Assist; Fluid consistency: Thin  Diet effective now                 Patient's There is no height or weight on file to calculate BMI.  DVT prophylaxis: enoxaparin (LOVENOX) injection 40 mg Start: 07/23/21 1545 SCDs Start: 07/21/21 0106. Code Status:   Code Status: Full Code  Family Communication: plan of care discussed with husband and daughter along with ID palliative care at bedside 9/12, multiple calls made since then.  TOC involved and communicating with  daughter. She is aware abt plan for SNF and cont on iv antibiotics  to complete the course and outpatient follow-up.  Status WU:[id number] Remains inpatient appropriate because:Ongoing diagnostic testing needed not appropriate for outpatient work up, IV treatments appropriate due to intensity of illness or inability to take PO, and Inpatient level of care appropriate due to severity of illness  Dispo:The patient is from:S[facility].             Anticipated d/c is to:SNF,daughter prefers to go to [facility] and not back to [facility]. TOC consulted-pending bed offers.            Patient currently:stable.            Difficult to place patient No Unresulted Labs (From admission, onward)     Start     Ordered   08/03/21 0500  Basic metabolic panel  Every 48 hours,   R     Question:  Specimen collection method  Answer:  Unit=Unit collect   08/02/21 0745   08/03/21 0500  CBC  Every 48 hours,   R     Question:  Specimen collection method  Answer:  Unit=Unit collect   08/02/21 0745          Medications reviewed: Scheduled Meds:  acetaminophen  500 mg Per Tube Q8H   allopurinol  100 mg Per Tube Daily   aspirin  81 mg Per Tube Daily   atorvastatin  40 mg Per Tube QHS   Chlorhexidine Gluconate Cloth  6 each Topical Daily   diclofenac Sodium  4 g Topical TID AC & HS   enoxaparin (LOVENOX) injection  40 mg Subcutaneous Q24H   feeding supplement (PROSource TF)  45 mL Per Tube TID   free water  200 mL Per Tube Q4H   geriatric multivitamins-minerals  15 mL Oral Daily   lubriderm seriously sensitive   Topical BID   melatonin  3 mg Oral QHS   metoprolol tartrate  25 mg Per Tube BID   OLANZapine  2.5 mg Per Tube Daily   OLANZapine  5 mg Per Tube QHS   potassium chloride  20 mEq Per Tube Daily   saccharomyces boulardii  250 mg Per Tube BID   sodium chloride flush  10-40 mL Intracatheter Q12H   thiamine  100 mg Per Tube Q8H   triamcinolone 0.1 % cream : eucerin   Topical BID   Continuous  Infusions:   ceFAZolin (ANCEF) IV 2 g (08/04/21 0541)   feeding supplement (JEVITY 1.5 CAL/FIBER) 1,000 mL (08/04/21 0053)   Consultants:see note  Procedures:see note Antimicrobials: Anti-infectives (From admission, onward)    Start     Dose/Rate Route Frequency Ordered Stop   07/24/21 1400  ceFAZolin (ANCEF) IVPB 2g/100 mL premix        2 g 200 mL/hr over 30 Minutes Intravenous Every 8 hours 07/24/21 1150 08/25/21 1359   07/22/21 2200  nafcillin 2 g in sodium chloride 0.9 % 100 mL IVPB  Status:  Discontinued        2 g 200 mL/hr over 30 Minutes Intravenous Every 4 hours 07/22/21 1320 07/24/21 1150   07/22/21 1215  vancomycin (VANCOREADY) IVPB 1250 mg/250 mL  Status:  Discontinued        1,250 mg 166.7 mL/hr over 90 Minutes Intravenous Every 24 hours 07/22/21 1120 07/22/21 1320   07/21/21 1700  rifampin (RIFADIN) 300 mg in sodium chloride 0.9 % 100 mL IVPB  Status:  Discontinued        300 mg 200 mL/hr over 30 Minutes Intravenous Every 12 hours 07/21/21  1327 07/22/21 1014   07/21/21 1600  nafcillin 2 g in sodium chloride 0.9 % 100 mL IVPB  Status:  Discontinued        2 g 200 mL/hr over 30 Minutes Intravenous Every 4 hours 07/21/21 1455 07/22/21 1014   07/21/21 1330  nafcillin injection 2 g  Status:  Discontinued        2 g Intravenous Every 4 hours 07/21/21 1326 07/21/21 1455   07/21/21 0000  vancomycin (VANCOCIN) IVPB 1000 mg/200 mL premix  Status:  Discontinued        1,000 mg 200 mL/hr over 60 Minutes Intravenous Every 12 hours 07/20/21 2156 07/21/21 1326   07/20/21 2200  ceFEPIme (MAXIPIME) 2 g in sodium chloride 0.9 % 100 mL IVPB  Status:  Discontinued        2 g 200 mL/hr over 30 Minutes Intravenous Every 8 hours 07/20/21 2143 07/21/21 1326   07/20/21 2145  vancomycin (VANCOREADY) IVPB 1750 mg/350 mL        1,750 mg 175 mL/hr over 120 Minutes Intravenous  Once 07/20/21 2143 07/21/21 0308      Culture/Microbiology    Component Value Date/Time   SDES URINE, CATHETERIZED  07/22/2021 1737   SPECREQUEST  07/22/2021 1737    NONE Performed at [facility], [address] [place], [place] [address]    CULT (A) 07/22/2021 1737    30,000 COLONIES/mL KLEBSIELLA ORNITHINOLYTICA 40,000 COLONIES/mL YEAST    REPTSTATUS 07/25/2021 FINAL 07/22/2021 1737    Other culture-see note  Objective: Vitals: Today's Vitals   08/04/21 0110 08/04/21 0136 08/04/21 0403 08/04/21 0754  BP: 102/84  100/77 103/76  Pulse:   100 91  Resp:   20 19  Temp:   98.2 F (36.8 C) 97.8 F (36.6 C)  TempSrc:   Oral Axillary  SpO2:   98% 100%  PainSc:  Asleep      Intake/Output Summary (Last 24 hours) at 08/04/2021 1126 Last data filed at 08/04/2021 1000 Gross per 24 hour  Intake 5625 ml  Output 825 ml  Net 4800 ml    There were no vitals filed for this visit. Weight change:   Intake/Output from previous day: 09/24 0701 - 09/25 0700 In: 5640 [NG/GT:5260; IV Piggyback:200] Out: 1300 [Urine:1300] Intake/Output this shift: Total I/O In: 165 [NG/GT:165] Out: -  There were no vitals filed for this visit.  Examination: General exam: Alert awake oriented to self, follows simple commands  HEENT:Oral mucosa moist, Ear/Nose WNL grossly, dentition normal. Respiratory system: bilaterally clear breath sounds, no use of accessory muscle Cardiovascular system: S1 & S2 +, No JVD,. Gastrointestinal system: Abdomen soft, NT,ND, BS+ Nervous System:Alert, awake, moving extremities and grossly nonfocal Extremities: Chronic appearing lower leg edema with hyperkeratotic and hyperpigmented distal legs  Skin: No rashes,no icterus.  Scratch marks legs right skin and the trunk Foley+, picc+  Data Reviewed: I have personally reviewed following labs and imaging studies CBC: Recent Labs  Lab 07/29/21 0620 07/30/21 0257 07/31/21 0445 08/01/21 0350 08/03/21 0603  WBC 11.5* 10.9* 9.6 11.6* 10.6*  HGB 8.1* 7.2* 7.0* 7.8* 7.7*  HCT 25.7* 22.9* 22.5* 24.8* 24.5*  MCV 100.4* 100.9* 101.8*  100.4* 102.5*  PLT 265 231 216 227 198    Basic Metabolic Panel: Recent Labs  Lab 07/29/21 0620 07/30/21 0257 07/31/21 0445 08/01/21 0350 08/03/21 0603  NA 138 137 138 139 141  K 3.8 3.9 3.9 4.0 4.1  CL 108 107 109 108 110  CO2 20* 20* 20*  22 24  GLUCOSE 95 81 103* 89 92  BUN 24* 32* 30* 25* 25*  CREATININE 0.72 0.76 0.72 0.69 0.72  CALCIUM 8.5* 8.2* 8.3* 8.8* 8.7*    GFR: CrCl cannot be calculated (Unknown ideal weight.). Liver Function Tests: No results for input(s): AST, ALT, ALKPHOS, BILITOT, PROT, ALBUMIN in the last 168 hours.  No results for input(s): LIPASE, AMYLASE in the last 168 hours. No results for input(s): AMMONIA in the last 168 hours. Coagulation Profile: No results for input(s): INR, PROTIME in the last 168 hours. Cardiac Enzymes: No results for input(s): CKTOTAL, CKMB, CKMBINDEX, TROPONINI in the last 168 hours. BNP (last 3 results) No results for input(s): PROBNP in the last 8760 hours. HbA1C: No results for input(s): HGBA1C in the last 72 hours.  CBG: Recent Labs  Lab 08/03/21 1620 08/03/21 2043 08/04/21 0024 08/04/21 0402 08/04/21 0757  GLUCAP 91 91 108* 74 78    Lipid Profile: No results for input(s): CHOL, HDL, LDLCALC, TRIG, CHOLHDL, LDLDIRECT in the last 72 hours.  Thyroid Function Tests: No results for input(s): TSH, T4TOTAL, FREET4, T3FREE, THYROIDAB in the last 72 hours. Anemia Panel: No results for input(s): VITAMINB12, FOLATE, FERRITIN, TIBC, IRON, RETICCTPCT in the last 72 hours. Sepsis Labs: No results for input(s): PROCALCITON, LATICACIDVEN in the last 168 hours.   No results found for this or any previous visit (from the past 240 hour(s)).     Radiology Studies: No results found.   LOS: 15 days   Lanae Boast, MD [facility]  08/04/2021, 11:26 AM

## 2021-08-05 LAB — GLUCOSE, CAPILLARY
Glucose-Capillary: 77 mg/dL (ref 70–99)
Glucose-Capillary: 79 mg/dL (ref 70–99)
Glucose-Capillary: 88 mg/dL (ref 70–99)
Glucose-Capillary: 90 mg/dL (ref 70–99)
Glucose-Capillary: 92 mg/dL (ref 70–99)
Glucose-Capillary: 93 mg/dL (ref 70–99)
Glucose-Capillary: 94 mg/dL (ref 70–99)

## 2021-08-05 LAB — BASIC METABOLIC PANEL
Anion gap: 11 (ref 5–15)
BUN: 31 mg/dL — ABNORMAL HIGH (ref 8–23)
CO2: 21 mmol/L — ABNORMAL LOW (ref 22–32)
Calcium: 8.7 mg/dL — ABNORMAL LOW (ref 8.9–10.3)
Chloride: 109 mmol/L (ref 98–111)
Creatinine, Ser: 0.85 mg/dL (ref 0.44–1.00)
GFR, Estimated: >60 mL/min (ref >60)
Glucose, Bld: 88 mg/dL (ref 70–99)
Potassium: 4.6 mmol/L (ref 3.5–5.1)
Sodium: 141 mmol/L (ref 135–145)

## 2021-08-05 LAB — CBC
HCT: 29.1 % — ABNORMAL LOW (ref 36.0–46.0)
Hemoglobin: 8.9 g/dL — ABNORMAL LOW (ref 12.0–15.0)
MCH: 31.9 pg (ref 26.0–34.0)
MCHC: 30.6 g/dL (ref 30.0–36.0)
MCV: 104.3 fL — ABNORMAL HIGH (ref 80.0–100.0)
Platelets: 200 10*3/uL (ref 150–400)
RBC: 2.79 MIL/uL — ABNORMAL LOW (ref 3.87–5.11)
RDW: 19.9 % — ABNORMAL HIGH (ref 11.5–15.5)
WBC: 10.9 10*3/uL — ABNORMAL HIGH (ref 4.0–10.5)
nRBC: 0 % (ref 0.0–0.2)

## 2021-08-05 LAB — SARS CORONAVIRUS 2 (TAT 6-24 HRS): SARS Coronavirus 2: NEGATIVE

## 2021-08-05 NOTE — Progress Notes (Signed)
PROGRESS NOTE    Kristy [name]  QMV:[id number] DOB: 03-29-1946 DOA: 07/20/2021 PCP: Darrow Bussing, MD   Brief Narrative: 6 old female with hypertension, polycythemia, hypertension alcohol use disorder two 12 ounce beer daily, recently admitted and discharged from [facility]  06/10/21-07/11/21 for 2 mechanical falls  and back pain at home-chest x-ray CT head C-spine unremarkable on admission, but had leukocytosis AKI abnormal LFTs urine culture with staph aureus blood culture was positive-and on further work-up found to have cervical epidural abscess C2-T3/4 status post ACDF on 06/16/2021 by  Dr Maurice Small +MSSA and was discharged on IV cefazolin and rifampin through 08/11/2021 and hospital course complicated by acute encephalopathy, dysphagia needing PEG tube, had recurrent urinary retention/UTI-Foley catheter was placed and discharged to SNF. Readmitted from the nursing facility due to shortness of breath, more confusion than usual baseline .  In the ED:CT head showed acute/subacute possible pontine infarct, CT abdomen pelvis showed dystrophic changes inferior endplate of T12, L1 vertebral body, narrowing of the intervening intervertebral disc and a small amount of soft tissue thickening anteriorly, new finding compared to previous CTs and may represent sequelae of discitis/osteomyelitis, MRI lumbar C-spine was ordered and lab work showed leukocytosis 12.5, CT chest small amount of airspace consolidation versus atelectasis in the bilateral lung bases. ED called [facility] per daughter request but no bed, then patient was admitted MRI brain positive for 1.5 cm? acute to subacute ischemic nonhemorrhagic central pontine infarct. MRI C-spine and L spine done: Referto the report for more detail but shows -vertebral -cervical/lumbar osteomyelitis with epidural abscesses, paraspinal muscle abscesses. 9/11 Neurosurgery, ID, neurology was consulted-Neurosurgery did not advise acute surgical intervention/ I&D. SLP-started  pured diet, was n.p.o. plus tube feed PTA 9/13:Repeat MRI brain w/ contrast:no intracranial focus of abnormal contrast enhancement. 9/14 ID recommended to continue with cefazolin through 08/25/2021, outpatient ID follow-up, overall treatment failure in the setting of hardware is higher especially in the setting of Staph aureus  Neurology and ID signed off, neurosurgery again saw on  9/15 and signed off. 9/19-psychiatry consulted-meds as adjusted and patient no more verbally abusive Patient remains confused, follows simple commands  Subjective:  Patient pulled out PICC line last PM and has a PIV in place Seen and examined.  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  Seen by ID, neurosurgery-did not advise acute surgical intervention. Blood culture no growth this admission. ID advised to continue Cefazolin through 08/25/2021 w/ PICC line- picc line removed by pt. Cont PIV. Overall treatment failure in the setting of hardware is higher especially in the setting of Staph aureus.  Acute Metabolic encephalopathy:multifactorial in the setting of her persistent spine infection and deconditioning, history of alcohol., now with subacute pontine infarct deconditioning.  Seen by neurology. S/P High-dose thiamine.on previous discharge mentation similar, pleasantly confused follows some commands.seen by neurology.  Psychiatry consulted 9/19 as patient was verbally abusive-mentation improved after being on Zyprexa 2.5 mg a.m, and 5 mg pm, melatonin nightly . Continue to monitor her mental status continue supportive care,. Prn atarax decreased to help with  mentation , minimize sedatives.  Continue fall precaution, delirium precaution,  reorientation.  I wonder if this is her new baseline.   Subacute pontine  infarct: nocontrast MRI 1.5 cm 2/2 subacute ischemic likely affecting multiple penetrating arteries due to small vessel disease.MRI brain with contrast repeated 9/12-no intracranial focus of abnormal contrast-enhancement.  Seen by neurology completed stroke -HBA1c 5.1 , lipid panel  ldl 88, goal < 70,CT angio head and neck no LVO. Echo with EF 60 to 65%, no RWMA, normal mitral valve, aortic valve with mild AR. Plan to cont  ASA 81 mg, Lipitor 40 mg.  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  Generalized weakness but able to move lower extremities very well with weakness more on upper extremities.  Continue PT OT  CKD stage II-renal function is stable Recurrent urinary tract infection requiring Foley catheter insertion last admission Urine culture with Klebsiella 30,000 colonies and yeast 40,000 colonies: Urine culture reviewed with ID.  No need of further antibiotics continue cefazolin. ?  Colonization.Continue Foley care, continue Flomax monitor urine output and renal function intermittently. Recent Labs  Lab 07/30/21 0257 07/31/21 0445 08/01/21 0350 08/03/21 0603 08/05/21 0706  BUN 32* 30* 25* 25* 31*  CREATININE 0.76 0.72 0.69 0.72 0.85   Diarrhea/loose stool: needed rectal tube briefly but resolved after changing Osmolite to  Jevity.  Continue probiotics while on antibiotics.   Hypokalemia resolved.  CT w/ Bilateral lung base consolidation/atelectasis-no respiratory symptoms, do not suspect pneumonia.  Encourage incentive spirometry, unfortunately at risk of pneumonia due to her current situation   Moderate Protein Calorie  malnutrition: Hypoglycemia: Hypoalbuminemia: Tolerating tube feeding with Jevity .  SLP advanced to dysphagia 3 diet, augment nutritional status.   Recent Labs  Lab 08/04/21 1623 08/04/21 1954 08/05/21 0016 08/05/21 0441 08/05/21 0953  GLUCAP 79 92 92 93 79    Mildly elevated bilirubin:Monitor.  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  On prn atarax, benadryl and skin cream.Rifampin stopped in case it was making the rash worse. Saw dermatology before.Will need dermatology follow up, ? biopsy  as OP.   Hypertension hx: BP stable. as on midodrine PTA-discontinued this admission and blood pressure doing well   Chronic anemia Epistaxis on last admission-needing blood transfusion Monitor transfuse if less than 7 g- monitor.  Check CBC intermittently.  Stable today Recent Labs  Lab 07/30/21 0257 07/31/21 0445 08/01/21 0350 08/03/21 0603 08/05/21 0706  HGB 7.2* 7.0* 7.8* 7.7* 8.9*  HCT 22.9* 22.5* 24.8* 24.5* 29.1*  VOZ:DGUYQIHKV full code.Given her infectious process, encephalopathy, co-morbidities palliative care consulted.Patient daughter claims patient was functional prior to last hospital admission and requesting full aggressive care at this time.PMT involved, overall prognosis does not appear bright. Seen by multiple consults.   EDP called [facility] but no beds so admitted to [facility]. Per daughter request called [facility] transfer line 9/12 evening and was  called by by Dr Collie Siad and NOT accepted for transfer. Diet Order             DIET DYS 3 Room service appropriate? Yes with Assist; Fluid consistency: Thin  Diet effective now                 Patient's There is no height or weight on file to calculate BMI.  DVT prophylaxis: enoxaparin (LOVENOX) injection 40 mg Start: 07/23/21 1545 SCDs Start: 07/21/21 0106. Code Status:   Code Status: Full Code  Family Communication: plan of care discussed with husband and  daughter along with ID palliative care at bedside 9/12, multiple calls made since then.  TOC  involved and communicating with daughter. She is aware abt plan for SNF and cont on iv antibiotics to complete the course and outpatient follow-up.  Status OF:BPZWCHENI Remains inpatient appropriate because:Ongoing diagnostic testing needed not appropriate for outpatient work up, IV treatments appropriate due to intensity of illness or inability to take PO, and Inpatient level of care appropriate due to severity of illness  Dispo:The patient is from:SNF [facility].             Anticipated d/c is to:SNF,daughter prefers to go to [place] and not back to [place]. TOC consulted-pending bed offers.            Patient currently:stable.            Difficult to place patient -likely DTP-as difficult to find a SNF place Unresulted Labs (From admission, onward)     Start     Ordered   08/03/21 0500  Basic metabolic panel  Every 48 hours,   R     Question:  Specimen collection method  Answer:  Unit=Unit collect   08/02/21 0745   08/03/21 0500  CBC  Every 48 hours,   R     Question:  Specimen collection method  Answer:  Unit=Unit collect   08/02/21 0745          Medications reviewed: Scheduled Meds:  acetaminophen  500 mg Per Tube Q8H   allopurinol  100 mg Per Tube Daily   aspirin  81 mg Per Tube Daily   atorvastatin  40 mg Per Tube QHS   Chlorhexidine Gluconate Cloth  6 each Topical Daily   diclofenac Sodium  4 g Topical TID AC & HS   enoxaparin (LOVENOX) injection  40 mg Subcutaneous Q24H   feeding supplement (PROSource TF)  45 mL Per Tube TID   free water  200 mL Per Tube Q4H   geriatric multivitamins-minerals  15 mL Oral Daily   lubriderm seriously sensitive   Topical BID   melatonin  3 mg Oral QHS   OLANZapine  2.5 mg Per Tube Daily   OLANZapine  5 mg Per Tube QHS   potassium chloride  20 mEq Per Tube Daily   saccharomyces boulardii  250 mg Per Tube BID   sodium chloride flush  10-40 mL  Intracatheter Q12H   thiamine  100 mg Per Tube Q8H   triamcinolone 0.1 % cream : eucerin   Topical BID   Continuous Infusions:   ceFAZolin (ANCEF) IV 2 g (08/05/21 0558)   feeding supplement (JEVITY 1.5 CAL/FIBER) 1,000 mL (08/04/21 2303)   Consultants:see note  Procedures:see note Antimicrobials: Anti-infectives (From admission, onward)    Start     Dose/Rate Route Frequency Ordered Stop   07/24/21 1400  ceFAZolin (ANCEF) IVPB 2g/100 mL premix        2 g 200 mL/hr over 30 Minutes Intravenous Every 8 hours 07/24/21 1150 08/25/21 1359   07/22/21 2200  nafcillin 2 g in sodium chloride 0.9 % 100 mL IVPB  Status:  Discontinued        2 g 200 mL/hr over 30 Minutes Intravenous Every 4 hours 07/22/21 1320 07/24/21 1150   07/22/21 1215  vancomycin (VANCOREADY) IVPB 1250 mg/250 mL  Status:  Discontinued        1,250 mg 166.7 mL/hr over 90 Minutes Intravenous Every 24 hours 07/22/21 1120 07/22/21 1320   07/21/21 1700  rifampin (RIFADIN) 300 mg in sodium chloride 0.9 % 100 mL IVPB  Status:  Discontinued  300 mg 200 mL/hr over 30 Minutes Intravenous Every 12 hours 07/21/21 1327 07/22/21 1014   07/21/21 1600  nafcillin 2 g in sodium chloride 0.9 % 100 mL IVPB  Status:  Discontinued        2 g 200 mL/hr over 30 Minutes Intravenous Every 4 hours 07/21/21 1455 07/22/21 1014   07/21/21 1330  nafcillin injection 2 g  Status:  Discontinued        2 g Intravenous Every 4 hours 07/21/21 1326 07/21/21 1455   07/21/21 0000  vancomycin (VANCOCIN) IVPB 1000 mg/200 mL premix  Status:  Discontinued        1,000 mg 200 mL/hr over 60 Minutes Intravenous Every 12 hours 07/20/21 2156 07/21/21 1326   07/20/21 2200  ceFEPIme (MAXIPIME) 2 g in sodium chloride 0.9 % 100 mL IVPB  Status:  Discontinued        2 g 200 mL/hr over 30 Minutes Intravenous Every 8 hours 07/20/21 2143 07/21/21 1326   07/20/21 2145  vancomycin (VANCOREADY) IVPB 1750 mg/350 mL        1,750 mg 175 mL/hr over 120 Minutes Intravenous   Once 07/20/21 2143 07/21/21 0308      Culture/Microbiology    Component Value Date/Time   SDES URINE, CATHETERIZED 07/22/2021 1737   SPECREQUEST  07/22/2021 1737    NONE Performed at [facility], [address], [place], [place] [address]    CULT (A) 07/22/2021 1737    30,000 COLONIES/mL KLEBSIELLA ORNITHINOLYTICA 40,000 COLONIES/mL YEAST    REPTSTATUS 07/25/2021 FINAL 07/22/2021 1737    Other culture-see note  Objective: Vitals: Today's Vitals   08/04/21 2229 08/04/21 2233 08/05/21 0442 08/05/21 0756  BP: (!) 106/57  96/60 95/66  Pulse: (!) 104  100 88  Resp: Temp:  98.9 F (37.2 C) 97.9 F (36.6 C) 97.9 F (36.6 C)  TempSrc:  Axillary Oral Oral  SpO2: 99%  98% 98%  PainSc:        Intake/Output Summary (Last 24 hours) at 08/05/2021 0958 Last data filed at 08/05/2021 0700 Gross per 24 hour  Intake 915 ml  Output 851 ml  Net 64 ml   There were no vitals filed for this visit. Weight change:   Intake/Output from previous day: 09/25 0701 - 09/26 0700 In: 1025 [NG/GT:825; IV Piggyback:200] Out: 851 [Urine:850; Stool:1] Intake/Output this shift: No intake/output data recorded. There were no vitals filed for this visit.  Examination: General exam: AAOx to self, follows commands, pleasantly confused  older than stated age, weak appearing. HEENT:Oral mucosa moist, Ear/Nose WNL grossly, dentition normal. Respiratory system: bilaterally clear breath sounds, no use of accessory muscle Cardiovascular system: S1 & S2 +, No JVD,. Gastrointestinal system: Abdomen soft, PEG present NT,ND, BS+ Nervous System:Alert, awake, moving lower extremities on command able to move upper extremities some -with unchanged weakness  Extremities: no edema, distal peripheral pulses palpable.  Skin: No rashes,no icterus. MSK: Normal muscle bulk,tone, power  Foley present  Data Reviewed: I have personally reviewed following labs and imaging studies CBC: Recent Labs   Lab 07/30/21 0257 07/31/21 0445 08/01/21 0350 08/03/21 0603 08/05/21 0706  WBC 10.9* 9.6 11.6* 10.6* 10.9*  HGB 7.2* 7.0* 7.8* 7.7* 8.9*  HCT 22.9* 22.5* 24.8* 24.5* 29.1*  MCV 100.9* 101.8* 100.4* 102.5* 104.3*  PLT 231 216 227 198 200   Basic Metabolic Panel: Recent Labs  Lab 07/30/21 0257 07/31/21 0445 08/01/21 0350 08/03/21 0603 08/05/21 0706  NA 137 138 139  141 141  K 3.9 3.9 4.0 4.1 4.6  CL 107 109 108 110 109  CO2 20* 20* 22 24 21*  GLUCOSE 81 103* 89 92 88  BUN 32* 30* 25* 25* 31*  CREATININE 0.76 0.72 0.69 0.72 0.85  CALCIUM 8.2* 8.3* 8.8* 8.7* 8.7*   GFR: CrCl cannot be calculated (Unknown ideal weight.). Liver Function Tests: No results for input(s): AST, ALT, ALKPHOS, BILITOT, PROT, ALBUMIN in the last 168 hours.  No results for input(s): LIPASE, AMYLASE in the last 168 hours. No results for input(s): AMMONIA in the last 168 hours. Coagulation Profile: No results for input(s): INR, PROTIME in the last 168 hours. Cardiac Enzymes: No results for input(s): CKTOTAL, CKMB, CKMBINDEX, TROPONINI in the last 168 hours. BNP (last 3 results) No results for input(s): PROBNP in the last 8760 hours. HbA1C: No results for input(s): HGBA1C in the last 72 hours.  CBG: Recent Labs  Lab 08/04/21 1623 08/04/21 1954 08/05/21 0016 08/05/21 0441 08/05/21 0953  GLUCAP 79 92 92 93 79   Lipid Profile: No results for input(s): CHOL, HDL, LDLCALC, TRIG, CHOLHDL, LDLDIRECT in the last 72 hours.  Thyroid Function Tests: No results for input(s): TSH, T4TOTAL, FREET4, T3FREE, THYROIDAB in the last 72 hours. Anemia Panel: No results for input(s): VITAMINB12, FOLATE, FERRITIN, TIBC, IRON, RETICCTPCT in the last 72 hours. Sepsis Labs: No results for input(s): PROCALCITON, LATICACIDVEN in the last 168 hours.   No results found for this or any previous visit (from the past 240 hour(s)).     Radiology Studies: No results found.   LOS: 16 days   Lanae Boast, MD [facility]  08/05/2021, 9:58 AM

## 2021-08-05 NOTE — TOC Progression Note (Addendum)
Transition of Care [facility]) - Progression Note    Patient Details  Name: RHODIA ACRES MRN: [id number] Date of Birth: [birth year]  [facility] CM/SW Contact  [name], [place] Phone Number: 08/05/2021, 3:24 PM  Clinical Narrative:    CSW notified dtr, [name], of pt's new bed offers and the previous ones. She stated she had visited a few places over the weekend, but would like to review the new ones before making a decision. CSW expressed that pt is medically ready and a choice needs to be made. CSW received another offer of [facility], and sent dtr a message advising her of the additional offer.   CSW attempted to follow up with dtr at 3:30 to get a bed offer and was unable to leave a voicemail.  CSW called dtr [name] and advised her that [facility] has accepted. She is agreeable to AF and [facility] will have a bed available tomorrow. Daughter is requesting to speak to a DR. Before pt is discharged. TOC will continue to follow.   Expected Discharge Plan: Skilled Nursing Facility Barriers to Discharge: Continued Medical Work up, SNF Pending bed offer  Expected Discharge Plan and Services Expected Discharge Plan: Skilled Nursing Facility     Post Acute Care Choice: Skilled Nursing Facility Living arrangements for the past 2 months: Skilled Nursing Facility                                       Social Determinants of Health (SDOH) Interventions    Readmission Risk Interventions No flowsheet data found.

## 2021-08-06 ENCOUNTER — Inpatient Hospital Stay: Payer: Self-pay

## 2021-08-06 LAB — GLUCOSE, CAPILLARY
Glucose-Capillary: 82 mg/dL (ref 70–99)
Glucose-Capillary: 88 mg/dL (ref 70–99)
Glucose-Capillary: 83 mg/dL (ref 70–99)

## 2021-08-06 MED ORDER — JEVITY 1.5 CAL/FIBER PO LIQD: 1000.0000 mL | ORAL | Status: DC

## 2021-08-06 MED ORDER — ALBUTEROL SULFATE (2.5 MG/3ML) 0.083% IN NEBU: 2.5000 mg | INHALATION_SOLUTION | Freq: Four times a day (QID) | RESPIRATORY_TRACT | 12 refills | Status: AC | PRN

## 2021-08-06 MED ORDER — PROSOURCE TF PO LIQD: 45.0000 mL | Freq: Three times a day (TID) | ORAL | Status: DC

## 2021-08-06 MED ORDER — FREE WATER: 200.0000 mL | Status: DC

## 2021-08-06 MED ORDER — LOPERAMIDE HCL 1 MG/7.5ML PO SUSP: 2.0000 mg | ORAL | 0 refills | Status: AC | PRN

## 2021-08-06 MED ORDER — ATORVASTATIN CALCIUM 40 MG PO TABS: 40.0000 mg | ORAL_TABLET | Freq: Every day | ORAL | Status: AC

## 2021-08-06 MED ORDER — OLANZAPINE 2.5 MG PO TABS: 2.5000 mg | ORAL_TABLET | Freq: Every day | ORAL | Status: DC

## 2021-08-06 MED ORDER — OXYCODONE HCL 5 MG PO TABS: 5.0000 mg | ORAL_TABLET | Freq: Two times a day (BID) | ORAL | 0 refills | Status: DC | PRN

## 2021-08-06 MED ORDER — SACCHAROMYCES BOULARDII 250 MG PO CAPS: 250.0000 mg | ORAL_CAPSULE | Freq: Two times a day (BID) | ORAL | 0 refills | Status: DC

## 2021-08-06 MED ORDER — OLANZAPINE 5 MG PO TABS: 5.0000 mg | ORAL_TABLET | Freq: Every day | ORAL | Status: DC

## 2021-08-06 MED ORDER — CEFAZOLIN IV (FOR PTA / DISCHARGE USE ONLY): 2.0000 g | Freq: Three times a day (TID) | INTRAVENOUS | 0 refills | Status: DC

## 2021-08-06 MED ORDER — ASPIRIN 81 MG PO CHEW: 81.0000 mg | CHEWABLE_TABLET | Freq: Every day | ORAL | Status: AC

## 2021-08-06 MED ORDER — ACETAMINOPHEN 500 MG PO TABS: 500.0000 mg | ORAL_TABLET | Freq: Four times a day (QID) | ORAL | 0 refills | Status: AC | PRN

## 2021-08-06 MED ORDER — HYDROXYZINE HCL 25 MG PO TABS: 12.5000 mg | ORAL_TABLET | Freq: Three times a day (TID) | ORAL | 0 refills | Status: AC | PRN

## 2021-08-06 MED ORDER — LUBRIDERM SERIOUSLY SENSITIVE EX LOTN: 1.0000 "application " | TOPICAL_LOTION | Freq: Two times a day (BID) | CUTANEOUS | 0 refills | Status: DC

## 2021-08-06 MED ORDER — MELATONIN 3 MG PO TABS: 3.0000 mg | ORAL_TABLET | Freq: Every day | ORAL | 0 refills | Status: AC

## 2021-08-06 NOTE — Plan of Care (Signed)
Max assist wit adls

## 2021-08-06 NOTE — Discharge Summary (Signed)
Physician Discharge Summary  Kristy [name] RKY:[id number] DOB: 1946-07-29 DOA: 07/20/2021  PCP: Lujean Amel, MD  Admit date: 07/20/2021 Discharge date: 08/06/2021  Admitted From: SNF Disposition:  SNF  Recommendations for Outpatient Follow-up:  Follow up with PCP in 1 week Please obtain CMP/CBC in weekly and send it to infectious disease office while patient is on IV antibiotics. Will need urology follow-up with Dr. Abner Greenspan to remove catheter and voiding trial. Call office in a week.   Follow-up with interval serology regarding PEG tube reversals once patient no longer needs after evaluation by the doctor and dietitian in the facility patient is consistently meeting her nutritional goal  PICC line removal after completion of IV antibiotics-continue routine PICC care  Home Health:NO  Equipment/Devices: NONE  Discharge Condition: Stable Code Status:   Code Status: Full Code Diet recommendation:  Diet Order             DIET DYS 3 Room service appropriate? Yes with Assist; Fluid consistency: Thin  Diet effective now                    Brief/Interim Summary: 75 old female with hypertension, polycythemia, hypertension alcohol use disorder two 12 ounce beer daily, recently admitted and discharged from [facility]  06/10/21-07/11/21 for 2 mechanical falls  and back pain at home-chest x-ray CT head C-spine unremarkable on admission, but had leukocytosis AKI abnormal LFTs urine culture with staph aureus blood culture was positive-and on further work-up found to have cervical epidural abscess C2-T3/4 status post ACDF on 06/16/2021 by  Dr Zada Finders +MSSA and was discharged on IV cefazolin and rifampin through 08/11/2021 and hospital course complicated by acute encephalopathy, dysphagia needing PEG tube, had recurrent urinary retention/UTI-Foley catheter was placed and discharged to SNF. Readmitted from the nursing facility due to shortness of breath, more confusion than usual baseline .   In the ED:CT  head showed acute/subacute possible pontine infarct, CT abdomen pelvis showed dystrophic changes inferior endplate of B15, L1 vertebral body, narrowing of the intervening intervertebral disc and a small amount of soft tissue thickening anteriorly, new finding compared to previous CTs and may represent sequelae of discitis/osteomyelitis, MRI lumbar C-spine was ordered and lab work showed leukocytosis 12.5, CT chest small amount of airspace consolidation versus atelectasis in the bilateral lung bases. ED called [facility] per daughter request but no bed, then patient was admitted MRI brain positive for 1.5 cm? acute to subacute ischemic nonhemorrhagic central pontine infarct. MRI C-spine and L spine done: Referto the report for more detail but shows -vertebral -cervical/lumbar osteomyelitis with epidural abscesses, paraspinal muscle abscesses. 9/11 Neurosurgery, ID, neurology was consulted-Neurosurgery did not advise acute surgical intervention/ I&D. SLP-started pured diet, was n.p.o. plus tube feed PTA 9/13:Repeat MRI brain w/ contrast:no intracranial focus of abnormal contrast enhancement. 9/14 ID recommended to continue with cefazolin through 08/25/2021, outpatient ID follow-up, overall treatment failure in the setting of hardware is higher especially in the setting of Staph aureus  Neurology and ID signed off, neurosurgery again saw on  9/15 and signed off. 9/19-psychiatry consulted-meds as adjusted and patient no more verbally abusive Patient remains confused, follows simple commands-mental status overall the same with subacute encephalopathy.  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  Seen by ID, neurosurgery-did not advise acute surgical intervention. Blood culture no growth this admission. ID advised to continue Cefazolin through 08/25/2021 w/ PICC line-which will be inserted prior to discharge today.   Cont PIV. Overall treatment failure in the setting of hardware is higher especially in the setting of Staph aureus.  Acute Metabolic encephalopathy:multifactorial in the setting of her persistent spine infection and deconditioning, history of alcohol., now with subacute pontine infarct deconditioning.  Seen by neurology. S/P High-dose thiamine.on previous discharge mentation similar, pleasantly confused follows some commands.seen by neurology.  Psychiatry consulted 9/19 as patient was verbally abusive-mentation improved after being on Zyprexa 2.5 mg a.m, and 5 mg pm, melatonin nightly .  Continues to have subacute encephalopathy at this time alert awake follows simple commands instruction .minimize narcotics sedatives  and benzo.continue fall precaution delirium precaution re-orientation . I wonder if this is her new baseline.   Subacute pontine infarct: nocontrast MRI 1.5 cm 2/2 subacute ischemic likely affecting multiple penetrating arteries due to small vessel disease.MRI brain with contrast repeated 9/12-no intracranial focus of abnormal contrast-enhancement.  Seen by neurology completed stroke -HBA1c 5.1 , lipid panel  ldl 88, goal < 70,CT angio head and neck no LVO. Echo with EF 60 to 65%, no RWMA, normal mitral valve, aortic valve with mild AR.  Continue ASA 81 mg, Lipitor 40 mg.  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  Generalized weakness but able to move lower extremities very well with weakness more on upper extremities.  Continue PT OT skilled rehab  CKD  stage II-renal function is stable Recurrent urinary tract infection requiring Foley catheter insertion last admission Urine culture with Klebsiella 30,000 colonies and yeast 40,000 colonies: Urine culture reviewed with ID.  No need of further antibiotics continue cefazolin. ?  Colonization.Continue Foley care, continue Flomax monitor urine output and renal function intermittently. Recent Labs  Lab 07/31/21 0445 08/01/21 0350 08/03/21 0603 08/05/21 0706  BUN 30* 25* 25* 31*  CREATININE 0.72 0.69 0.72 0.85   Diarrhea/loose stool: needed rectal tube briefly but resolved after changing Osmolite to  Jevity.  Continue probiotics while on antibiotics.   Hypokalemia resolved.  CT w/ Bilateral lung base consolidation/atelectasis-no respiratory symptoms, do not suspect pneumonia.  Encourage incentive spirometry, unfortunately at risk of pneumonia due to her current situation   Moderate Protein Calorie malnutrition: Hypoglycemia: Hypoalbuminemia: Tolerating tube feeding with Jevity .  SLP advanced to dysphagia 3 diet, augment nutritional status.  Once able to meet nutritional goal family looking into reversing PEG tube IR number provided Recent Labs  Lab 08/05/21 1646 08/05/21 2005 08/05/21 2317 08/06/21 0353 08/06/21 0829  GLUCAP 90 88 77 82 88    Mildly elevated bilirubin:Monitor.  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  On prn atarax, benadryl and skin cream.Rifampin stopped in case it was making the rash worse. Saw dermatology before.Will need dermatology follow up, ? biopsy  as OP.   Hypertension hx: BP stable. as on midodrine PTA-discontinued this admission and blood pressure doing well   Chronic anemia Epistaxis on last admission-needing blood transfusion Monitor transfuse if less than 7 g- monitor.  Hemoglobin stable 8.9 g.   Recent Labs  Lab 07/31/21 0445 08/01/21 0350 08/03/21 0603 08/05/21 0706  HGB 7.0*  7.8* 7.7* 8.9*  HCT 22.5* 24.8* 24.5* 29.1*   TSV:[id number] full code.Given her infectious process, encephalopathy, co-morbidities palliative care consulted.Patient  daughter claims patient was functional prior to last hospital admission and requesting full aggressive care at this time.PMT involved, overall prognosis does not appear bright. Seen by multiple consults during this admissions and has been deemed stable for discharge back to skilled nursing facility to continue ongoing therapy, IV antibiotics and outpatient follow-up. At risk of readmission. EDP called [facility] but no beds so admitted to [facility]. Per daughter request called [facility] transfer line 9/12 evening and was  called by by Dr Kerby Less and NOT accepted for transfer.   Consults: Neurosurgery Neurology Infectious disease Palliative medicine  Subjective: Alert awake able to tell me her name or date of birth she thinks she is in the hospital able to follow commands moving lower extremities very well with weakness in upper extremities as before.  Reports she is not itching anymore today.  Discharge Exam: Vitals:   08/06/21 0322 08/06/21 0831  BP: (!) 145/77 125/78  Pulse: 99 100  Resp: 20 18  Temp: 97.7 F (36.5 C) 98 F (36.7 C)  SpO2: 99% 100%   General: Pt is alert, awake, not in acute distress Cardiovascular: RRR, S1/S2 +, no rubs, no gallops Respiratory: CTA bilaterally, no wheezing, no rhonchi Abdominal: Soft, NT, ND, bowel sounds +, PEG+ Extremities: no edema, no cyanosis, hyperpigmented hyperkeratotic distal extremities Scratch mark raw skin in the chest  Discharge Instructions  Discharge Instructions     [facility] pharmacist to adjust dose for Vancomycin, Aminoglycosides and other anti-infective therapies as requested by physician.   Complete by: As directed    [facility] to provide Cath Flo 72m   Complete by: As directed    Administer for PICC line occlusion and as ordered by  physician for other access device issues.   Anaphylaxis Kit: Provided to treat any anaphylactic reaction to the medication being provided to the patient if First Dose or when requested by physician   Complete by: As directed    Epinephrine 179mml vial / amp: Administer 0.4m27m0.4ml18mubcutaneously once for moderate to severe anaphylaxis, nurse to call physician and pharmacy when reaction occurs and call 911 if needed for immediate care   Diphenhydramine 50mg74mIV vial: Administer 25-50mg 28mM PRN for first dose reaction, rash, itching, mild reaction, nurse to call physician and pharmacy when reaction occurs   Sodium Chloride 0.9% NS 500ml I45mdminister if needed for hypovolemic blood pressure drop or as ordered by physician after call to physician with anaphylactic reaction   Change dressing on IV access line weekly and PRN   Complete by: As directed    Discharge instructions   Complete by: As directed    Send weekly CBC CMP ESR and CRP while patient is on IV antibiotics to infectious disease office  Follow-up with interventional radiology once decision made to reverse PEG tube. can get referral from PCP TOO.  Remove PICC line once antibiotic course is completed  Patient will need voiding trial urology follow-up for removal of catheter call urology office in 1 week at Dr Gay's [facility] [place] wound care:   Complete by: As directed    Bathe daily with soap and water and pat dry. Apply Eucerin cream to trunk and extremities each shift and PRN dry skin.  Continue hydroxyzine for itching.  Sacral foam to breakdown on sacrum. Change every three days and PRN soilage.   Flush IV access with Sodium Chloride 0.9% and Heparin 10 units/ml or 100 units/ml   Complete by: As directed  Home infusion instructions - [facility]   Complete by: As directed    Instructions: Flush IV access with Sodium Chloride 0.9% and Heparin 10units/ml or 100units/ml   Change dressing on IV access line:  Weekly and PRN   Instructions Cath Flo 36m: Administer for PICC Line occlusion and as ordered by physician for other access device   [facility] pharmacist to adjust dose for: Vancomycin, Aminoglycosides and other anti-infective therapies as requested by physician   Increase activity slowly   Complete by: As directed    Method of administration may be changed at the discretion of home infusion pharmacist based upon assessment of the patient and/or caregiver's ability to self-administer the medication ordered   Complete by: As directed       Allergies as of 08/06/2021   No Known Allergies      Medication List     STOP taking these medications    acetaminophen 160 MG/5ML solution Commonly known as: TYLENOL Replaced by: acetaminophen 500 MG tablet   ceFAZolin 2-4 GM/100ML-% IVPB Commonly known as: ANCEF   midodrine 5 MG tablet Commonly known as: PROAMATINE   polyethylene glycol 17 g packet Commonly known as: MIRALAX / GLYCOLAX   RA SALINE ENEMA RE   rifampin 300 MG capsule Commonly known as: RIFADIN       TAKE these medications    acetaminophen 500 MG tablet Commonly known as: TYLENOL Place 1 tablet (500 mg total) into feeding tube every 6 (six) hours as needed for mild pain (or Fever >/= 101). Replaces: acetaminophen 160 MG/5ML solution   albuterol (2.5 MG/3ML) 0.083% nebulizer solution Commonly known as: PROVENTIL Take 3 mLs (2.5 mg total) by nebulization every 6 (six) hours as needed for wheezing or shortness of breath.   allopurinol 100 MG tablet Commonly known as: ZYLOPRIM Take 100 mg by mouth daily.   aspirin 81 MG chewable tablet Place 1 tablet (81 mg total) into feeding tube daily. Start taking on: August 07, 2021   atorvastatin 40 MG tablet Commonly known as: LIPITOR Place 1 tablet (40 mg total) into feeding tube at bedtime.   bisacodyl 10 MG suppository Commonly known as: DULCOLAX Place 10 mg rectally as needed for moderate  constipation.   ceFAZolin  IVPB Commonly known as: ANCEF Inject 2 g into the vein every 8 (eight) hours. Indication:  bacteremia First Dose: Yes Last Day of Therapy:  08/25/21 Labs - Once weekly:  CBC/D and BMP, Labs - Every other week:  ESR and CRP Method of administration: IV Push Method of administration may be changed at the discretion of home infusion pharmacist based upon assessment of the patient and/or caregiver's ability to self-administer the medication ordered.   diclofenac Sodium 1 % Gel Commonly known as: VOLTAREN Apply 4 g topically 4 (four) times daily. To neck   feeding supplement (JEVITY 1.5 CAL/FIBER) Liqd Place 1,000 mLs into feeding tube continuous. What changed: additional instructions   feeding supplement (PROSource TF) liquid Place 45 mLs into feeding tube 3 (three) times daily. What changed: when to take this   folic acid 1 MG tablet Commonly known as: FOLVITE Place 1 tablet (1 mg total) into feeding tube daily.   free water Soln Place 200 mLs into feeding tube every 4 (four) hours. What changed: how much to take   hydrOXYzine 25 MG tablet Commonly known as: ATARAX/VISTARIL Place 0.5 tablets (12.5 mg total) into feeding tube every 8 (eight) hours as needed for itching. What changed:  how much  to take how to take this when to take this   loperamide HCl 1 MG/7.5ML suspension Commonly known as: IMODIUM Place 15 mLs (2 mg total) into feeding tube as needed for diarrhea or loose stools.   lubriderm seriously sensitive Lotn Apply 1 application topically 2 (two) times daily.   melatonin 3 MG Tabs tablet Take 1 tablet (3 mg total) by mouth at bedtime. What changed:  how to take this when to take this reasons to take this   MILK OF MAGNESIA PO Take 30 mLs by mouth daily as needed (constipation).   multivitamin Liqd Place 15 mLs into feeding tube daily.   niacin 500 MG tablet Place 1 tablet (500 mg total) into feeding tube at bedtime.    OLANZapine 5 MG tablet Commonly known as: ZYPREXA Place 1 tablet (5 mg total) into feeding tube at bedtime.   OLANZapine 2.5 MG tablet Commonly known as: ZYPREXA Place 1 tablet (2.5 mg total) into feeding tube daily. Start taking on: August 07, 2021   oxyCODONE 5 MG immediate release tablet Commonly known as: Oxy IR/ROXICODONE Take 1 tablet (5 mg total) by mouth 2 (two) times daily as needed for up to 4 doses for breakthrough pain or severe pain. What changed: when to take this   saccharomyces boulardii 250 MG capsule Commonly known as: FLORASTOR Place 1 capsule (250 mg total) into feeding tube 2 (two) times daily.   thiamine 100 MG tablet Place 1 tablet (100 mg total) into feeding tube daily.               Discharge Care Instructions  (From admission, onward)           Start     Ordered   08/06/21 0000  Change dressing on IV access line weekly and PRN  (Home infusion instructions - [facility] )        08/06/21 1004   08/06/21 0000  Discharge wound care:       Comments: Bathe daily with soap and water and pat dry. Apply Eucerin cream to trunk and extremities each shift and PRN dry skin.  Continue hydroxyzine for itching.  Sacral foam to breakdown on sacrum. Change every three days and PRN soilage.   08/06/21 1004            Contact information for follow-up providers     Janith Lima, MD Follow up in 1 week(s).   Specialty: Urology Contact information: [place] [address] [phone]         Jabier Mutton, MD Follow up in 2 week(s).   Specialty: Infectious Diseases Why: Send weekly CBC CMP ESR and CRP while patient is on IV antibiotics Contact information: [address] [place]  [address] [phone]         [facility] Follow up.   Why: Follow-up with interventional radiology once decision made to reverse PEG tube. can get referral from PCP TOO. Contact  information: [place] [address] [phone]              Contact information for after-discharge care     Destination     [facility] Preferred SNF .   Service: Skilled Nursing Contact information: [address] [place] [phone]                    No Known Allergies  The results of  significant diagnostics from this hospitalization (including imaging, microbiology, ancillary and laboratory) are listed below for reference.    Microbiology: Recent Results (from the past 240 hour(s))  SARS CORONAVIRUS 2 (TAT 6-24 HRS) Nasopharyngeal Nasopharyngeal Swab     Status: None   Collection Time: 08/05/21  5:04 PM   Specimen: Nasopharyngeal Swab  Result Value Ref Range Status   SARS Coronavirus 2 NEGATIVE NEGATIVE Final    Comment: (NOTE) SARS-CoV-2 target nucleic acids are NOT DETECTED.  The SARS-CoV-2 RNA is generally detectable in upper and lower respiratory specimens during the acute phase of infection. Negative results do not preclude SARS-CoV-2 infection, do not rule out co-infections with other pathogens, and should not be used as the sole basis for treatment or other patient management decisions. Negative results must be combined with clinical observations, patient history, and epidemiological information. The expected result is Negative.  Fact Sheet for Patients: SugarRoll.be  Fact Sheet for Healthcare Providers: https://www.woods-mathews.com/  This test is not yet approved or cleared by the [place] FDA and  has been authorized for detection and/or diagnosis of SARS-CoV-2 by FDA under an Emergency Use Authorization (EUA). This EUA will remain  in effect (meaning this test can be used) for the duration of the COVID-19 declaration under Se ction 564(b)(1) of the Act, 21 U.S.C. section 360bbb-3(b)(1), unless the authorization is terminated  or revoked sooner.  Performed at [facility], [place] [address] [place], [place] [address]     Procedures/Studies: CT ANGIO HEAD W OR WO CONTRAST  Result Date: 07/21/2021 CLINICAL DATA:  74 year old female with neurologic deficit. 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 Cervical ACDF last month. EXAM: CT ANGIOGRAPHY HEAD AND NECK TECHNIQUE: Multidetector CT imaging of the head and neck was performed using the standard protocol during bolus administration of intravenous contrast. Multiplanar CT image reconstructions and MIPs were obtained to evaluate the vascular anatomy. Carotid stenosis measurements (when applicable) are obtained utilizing NASCET criteria, using the distal internal carotid diameter as the denominator. CONTRAST:  16m OMNIPAQUE IOHEXOL 350 MG/ML SOLN COMPARISON:  Brain MRI 0029 hours today.  Head CT 07/20/2021. FINDINGS: CT HEAD Brain: Confluent hypodensity in the central pons is less distinct but otherwise stable from the head CT yesterday. No associated hemorrhage or mass effect. Stable gray-white matter differentiation elsewhere. No midline shift or ventriculomegaly. No acute intracranial hemorrhage identified. Calvarium and skull base: Congenital incomplete ossification of the posterior C1 ring. No acute osseous abnormality identified. Paranasal sinuses: Visualized paranasal sinuses and mastoids are stable and well aerated. Orbits: No acute orbit or scalp soft tissue finding. CTA NECK Skeleton: Absent maxillary dentition. Recent C5-C6 ACDF. Advanced cervical spine degeneration elsewhere. Partially visible thoracic kyphoscoliosis. No acute osseous abnormality identified, but there is prevertebral phlegmon extending from just below the ACDF hardware to the thoracic inlet, T2 level. No discrete or enhancing fluid collection. Upper chest: Evidence of small bilateral layering pleural effusions with atelectasis. Pulmonary septal thickening  in the upper lungs. No superior mediastinal lymphadenopathy. Visible central pulmonary arteries are enhancing and appear patent. Other neck: Mild postoperative soft tissue changes to the right anterior neck and tracking along the right thyroid to the prevertebral space related to recent ACDF. Otherwise negative. Aortic arch: 3 vessel arch configuration. Mild arch atherosclerosis. Right carotid system: Mildly tortuous brachiocephalic artery and proximal right CCA without plaque or stenosis. Negative right carotid bifurcation and cervical right ICA. Left carotid system: Negative left CCA aside from mild tortuosity. Mild calcified plaque at  the posterior left ICA origin without stenosis. Vertebral arteries: Normal proximal right subclavian artery aside from tortuosity. Normal right vertebral artery origin. The V1 segment is affected by the paraspinal phlegmon, but remains patent with no stenosis identified. The right V2 and V3 segments are patent. No stenosis to the skull base. Proximal left subclavian artery and left vertebral artery origin are normal. The left vertebral is mildly dominant. The V1 segment is tortuous in the region of the paraspinal phlegmon. The vessel remains patent without stenosis to the skull base. CTA HEAD Posterior circulation: Dominant left vertebral V4 segment primarily supplies the basilar, the right V4 is diminutive beyond the patent PICA origin. Left PICA origin is patent. Patent basilar artery. No basilar stenosis. Patent SCA and PCA origins. Posterior communicating arteries are diminutive or absent. Bilateral PCA branches are patent with mild irregularity. Anterior circulation: Both ICA siphons are patent. No right siphon plaque or stenosis. No left siphon plaque, although the distal left ICA appears mildly stenotic to the terminus - but might simply be non dominant due to the ACA anatomy. Both carotid termini are patent. Patent MCA origins. Right ACA A1 is dominant, the left is  diminutive or absent. Normal anterior communicating artery. Bilateral ACA branches are within normal limits. Bilateral MCA M1 segments and bifurcations are somewhat diminutive but patent without stenosis. No MCA branch occlusion identified, right MCA branches are mildly irregular. Venous sinuses: Superior sagittal sinus, dominant right transverse and sigmoid sinus and right IJ bulb are patent. Anatomic variants: Dominant left vertebral artery, the right functionally terminates in PICA. Dominant right and diminutive or absent left ACA A1 segments. Review of the MIP images confirms the above findings IMPRESSION: 1. 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 2. Negative for large vessel occlusion. Basilar Artery is patent without stenosis. 3. Stable CT appearance of the brain since yesterday. 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 4. Mild for age atherosclerosis in the neck. More advanced intracranial atherosclerosis, but no significant arterial stenosis identified. 5. Recent C5-C6 ACDF. 6. Small layering pleural effusions with suspected pulmonary interstitial edema. Electronically Signed   By: Genevie Ann M.D.   On: 07/21/2021 09:24   CT HEAD WO CONTRAST (5MM)  Result Date: 07/20/2021 CLINICAL DATA:  Mental status change EXAM: CT HEAD WITHOUT CONTRAST TECHNIQUE: Contiguous axial images were obtained from the base of the skull through the vertex without intravenous contrast. COMPARISON:  MRI 06/15/2021, CT brain 06/14/2021 FINDINGS: Brain: No hemorrhage or intracranial mass is visualized. New focal hypodensity within the pons, series 2, image 14, sagittal series 5, image 33. Mild motion degradation. Atrophy and mild chronic small vessel ischemic changes of the white matter. Stable ventricle size. Vascular: No hyperdense vessels.  No  unexpected calcification Skull: Normal. Negative for fracture or focal lesion. Sinuses/Orbits: No acute finding. Other: Rotated appearance of C1 on C2 likely due to head position. Incomplete fusion posterior arch of C1 IMPRESSION: 1. Findings are suspicious for acute to subacute pontine infarct, this is new compared to the previous exams from August. No hemorrhage identified 2. Atrophy and chronic small vessel ischemic changes of the white matter Electronically Signed   By: Donavan Foil M.D.   On: 07/20/2021 20:06   CT ANGIO NECK W OR WO CONTRAST  Result Date: 07/21/2021 CLINICAL DATA:  75 year old female with neurologic deficit. 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  Cervical ACDF last month. EXAM: CT ANGIOGRAPHY HEAD AND NECK TECHNIQUE: Multidetector CT imaging of the head and neck was performed using the standard protocol during bolus administration of intravenous contrast. Multiplanar CT image reconstructions and MIPs were obtained to evaluate the vascular anatomy. Carotid stenosis measurements (when applicable) are obtained utilizing NASCET criteria, using the distal internal carotid diameter as the denominator. CONTRAST:  31m OMNIPAQUE IOHEXOL 350 MG/ML SOLN COMPARISON:  Brain MRI 0029 hours today.  Head CT 07/20/2021. FINDINGS: CT HEAD Brain: Confluent hypodensity in the central pons is less distinct but otherwise stable from the head CT yesterday. No associated hemorrhage or mass effect. Stable gray-white matter differentiation elsewhere. No midline shift or ventriculomegaly. No acute intracranial hemorrhage identified. Calvarium and skull base: Congenital incomplete ossification of the posterior C1 ring. No acute osseous abnormality identified. Paranasal sinuses: Visualized paranasal sinuses and mastoids are stable and well aerated. Orbits: No acute orbit or scalp soft tissue finding. CTA NECK Skeleton: Absent maxillary dentition. Recent C5-C6 ACDF.  Advanced cervical spine degeneration elsewhere. Partially visible thoracic kyphoscoliosis. No acute osseous abnormality identified, but there is prevertebral phlegmon extending from just below the ACDF hardware to the thoracic inlet, T2 level. No discrete or enhancing fluid collection. Upper chest: Evidence of small bilateral layering pleural effusions with atelectasis. Pulmonary septal thickening in the upper lungs. No superior mediastinal lymphadenopathy. Visible central pulmonary arteries are enhancing and appear patent. Other neck: Mild postoperative soft tissue changes to the right anterior neck and tracking along the right thyroid to the prevertebral space related to recent ACDF. Otherwise negative. Aortic arch: 3 vessel arch configuration. Mild arch atherosclerosis. Right carotid system: Mildly tortuous brachiocephalic artery and proximal right CCA without plaque or stenosis. Negative right carotid bifurcation and cervical right ICA. Left carotid system: Negative left CCA aside from mild tortuosity. Mild calcified plaque at the posterior left ICA origin without stenosis. Vertebral arteries: Normal proximal right subclavian artery aside from tortuosity. Normal right vertebral artery origin. The V1 segment is affected by the paraspinal phlegmon, but remains patent with no stenosis identified. The right V2 and V3 segments are patent. No stenosis to the skull base. Proximal left subclavian artery and left vertebral artery origin are normal. The left vertebral is mildly dominant. The V1 segment is tortuous in the region of the paraspinal phlegmon. The vessel remains patent without stenosis to the skull base. CTA HEAD Posterior circulation: Dominant left vertebral V4 segment primarily supplies the basilar, the right V4 is diminutive beyond the patent PICA origin. Left PICA origin is patent. Patent basilar artery. No basilar stenosis. Patent SCA and PCA origins. Posterior communicating arteries are diminutive or  absent. Bilateral PCA branches are patent with mild irregularity. Anterior circulation: Both ICA siphons are patent. No right siphon plaque or stenosis. No left siphon plaque, although the distal left ICA appears mildly stenotic to the terminus - but might simply be non dominant due to the ACA anatomy. Both carotid termini are patent. Patent MCA origins. Right ACA A1 is dominant, the left is diminutive or absent. Normal anterior communicating artery. Bilateral ACA branches are within normal limits. Bilateral MCA M1 segments and bifurcations are somewhat diminutive but patent without stenosis. No MCA branch occlusion identified, right MCA branches are mildly irregular. Venous sinuses: Superior sagittal sinus, dominant right transverse and sigmoid sinus and right IJ bulb are patent. Anatomic variants: Dominant left vertebral artery, the right functionally terminates in PICA. Dominant right and diminutive or absent left ACA A1 segments. Review of the MIP images confirms  the above findings IMPRESSION: 1. 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 2. Negative for large vessel occlusion. Basilar Artery is patent without stenosis. 3. Stable CT appearance of the brain since yesterday. 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 4. Mild for age atherosclerosis in the neck. More advanced intracranial atherosclerosis, but no significant arterial stenosis identified. 5. Recent C5-C6 ACDF. 6. Small layering pleural effusions with suspected pulmonary interstitial edema. Electronically Signed   By: Genevie Ann M.D.   On: 07/21/2021 09:24   CT Chest W Contrast  Result Date: 07/20/2021 CLINICAL DATA:  Abdominal pain and fever. EXAM: CT CHEST, ABDOMEN, AND PELVIS WITH CONTRAST TECHNIQUE: Multidetector CT imaging of the chest, abdomen and pelvis was performed  following the standard protocol during bolus administration of intravenous contrast. CONTRAST:  7m OMNIPAQUE IOHEXOL 350 MG/ML SOLN COMPARISON:  CT of the abdomen June 27, 2021 FINDINGS: CT CHEST FINDINGS Cardiovascular: Normal heart size. No pericardial effusion. No central pulmonary embolus. No dissection or aneurysmal dilation of the aorta. Mediastinum/Nodes: No enlarged mediastinal, hilar, or axillary lymph nodes. Thyroid gland, trachea, and esophagus demonstrate no significant findings. Lungs/Pleura: Small bilateral pleural effusions. Small amount of airspace consolidation versus atelectasis in the bilateral lung bases. Musculoskeletal: No chest wall mass or suspicious bone lesions identified. CT ABDOMEN PELVIS FINDINGS Hepatobiliary: No focal liver abnormality is seen. No gallstones, gallbladder wall thickening, or biliary dilatation. Pancreas: Unremarkable. No pancreatic ductal dilatation or surrounding inflammatory changes. Spleen: Normal in size without focal abnormality. Adrenals/Urinary Tract: Normal adrenal glands. Bilateral too small to be actually characterized hypoattenuated masses. Stable cystic appearing lesion in the lower pole of the left kidney. Stomach/Bowel: Gastrostomy tube is appropriately positioned within the stomach without significant surrounding inflammatory changes. No evidence of small-bowel obstruction. Normal caliber of the colon. Vascular/Lymphatic: Aortic atherosclerosis. No enlarged abdominal or pelvic lymph nodes. Reproductive: Status post hysterectomy. No adnexal masses. Other: No abdominal wall hernia or abnormality. No abdominopelvic ascites. Musculoskeletal: There has been an interval development of dystrophic changes involving the inferior endplate of TM03vertebral body and superior endplate of L1 vertebral body. There is an associated narrowing of the intervening intervertebral disc and small amount of soft tissue thickening anteriorly. This finding is new when compared  to patient's prior CT dated June 27, 2021 IMPRESSION: 1. Small bilateral pleural effusions. 2. Small amount of airspace consolidation versus atelectasis in the bilateral lung bases. 3. Interval development of dystrophic changes involving the inferior endplate of TK91vertebral body and superior endplate of L1 vertebral body. There is an associated narrowing of the intervening intervertebral disc and small amount of soft tissue thickening anteriorly. This finding is new when compared to patient's prior CT dated June 27, 2021, and therefore may represent sequela of discitis/osteomyelitis. Differential diagnosis includes asymmetric discogenic osteoarthritic endplate changes. Please correlate to patient's symptoms. If further imaging evaluation is desired, contrast enhanced MRI of the lumbar spine may be considered. 4. Gastrostomy tube appropriately positioned within the stomach without significant surrounding inflammatory changes. 5. Bilateral too small to be actually characterized hypoattenuated masses in both kidneys. Aortic Atherosclerosis (ICD10-I70.0). Electronically Signed   By: DFidela SalisburyM.D.   On: 07/20/2021 20:28   MR BRAIN WO CONTRAST  Result Date: 07/21/2021 CLINICAL DATA:  Initial evaluation for mental status change. EXAM: MRI HEAD WITHOUT CONTRAST TECHNIQUE: Multiplanar, multiecho pulse sequences of the brain and surrounding structures were obtained without intravenous contrast.  COMPARISON:  CT from 07/20/2021. FINDINGS: Brain: Examination degraded by motion artifact. Age-related cerebral atrophy. Patchy and confluent T2/FLAIR hyperintensity within the periventricular and deep white matter both cerebral hemispheres, most consistent with chronic small vessel ischemic disease, mild-to-moderate in nature. Acute to subacute ischemic infarct involving the central pons measures 1.5 x 0.9 cm (series 3, image 14). No associated hemorrhage or mass effect. No other evidence for acute or subacute  ischemia. Gray-white matter differentiation otherwise maintained. No other areas of remote cortical infarction. No other evidence for acute or chronic intracranial hemorrhage. No mass lesion, mass effect or midline shift. No hydrocephalus or extra-axial fluid collection. Pituitary gland suprasellar region within normal limits. Midline structures intact. Vascular: Major intracranial vascular flow voids are grossly maintained on this motion degraded exam. Skull and upper cervical spine: Craniocervical junction within normal limits. Bone marrow signal intensity grossly normal. No scalp soft tissue abnormality. Sinuses/Orbits: Globes and orbital soft tissues demonstrate no obvious abnormality. Paranasal sinuses are largely clear. No significant mastoid effusion. Other: None. IMPRESSION: 1. 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 2. Underlying mild to moderate chronic microvascular ischemic disease. Electronically Signed   By: Jeannine Boga M.D.   On: 07/21/2021 01:41   MR BRAIN W CONTRAST  Result Date: 07/23/2021 CLINICAL DATA:  Meningitis/CNS infection suspected. EXAM: MRI HEAD WITH CONTRAST TECHNIQUE: Multiplanar, multiecho pulse sequences of the brain and surrounding structures were obtained with intravenous contrast. CONTRAST:  7.52m GADAVIST GADOBUTROL 1 MMOL/ML IV SOLN COMPARISON:  MRI of the brain July 22, 2019. FINDINGS: Postcontrast images show no evidence of parenchymal or leptomeningeal focus of abnormal contrast. IMPRESSION: No intracranial focus of abnormal contrast enhancement. Electronically Signed   By: KPedro EarlsM.D.   On: 07/23/2021 14:15   MR CERVICAL SPINE W WO CONTRAST  Result Date: 07/21/2021 CLINICAL DATA:  Initial evaluation for cervical radiculopathy, infection suspected. EXAM: MRI CERVICAL SPINE WITHOUT AND WITH CONTRAST TECHNIQUE: Multiplanar and multiecho pulse sequences of the cervical spine, to include the  craniocervical junction and cervicothoracic junction, were obtained without and with intravenous contrast. CONTRAST:  826mGADAVIST GADOBUTROL 1 MMOL/ML IV SOLN COMPARISON:  MRI from 06/16/2021. FINDINGS: Alignment: Examination severely degraded by motion artifact. Exaggeration of the normal cervical lordosis.  No listhesis. Vertebrae: Patient has undergone ACDF at C5-6 since prior exam. There is persistent marrow edema with fluid signal intensity within the C5-6 interspace, concerning for persistent and/or recurrent infection. Additionally, abnormal marrow edema in enhancement seen about the C6-7, C7-T1, and likely T1-2 interspaces, also concerning for osteomyelitis discitis. Associated epidural phlegmon/abscess within the ventral epidural space extending from C4 through C6-7 (series 2, image 11). Abnormal enhancement seen diffusely within the dorsal epidural space extending from C2 through the upper thoracic spine. Probable small amount of ventral epidural phlegmonous change at the T1-2 level as well. Associated prevertebral edema, with additional edema and enhancement within the posterior paraspinous soft tissues. No visible discrete soft tissue abscess on this motion degraded exam. Cord: Mild flattening and deformity of the cervical cord extending from C4 through C6-7 by the ventral epidural phlegmon/collection. No visible cord signal changes on this motion degraded exam. Posterior Fossa, vertebral arteries, paraspinal tissues: Evolving acute to subacute pontine infarct noted. Paraspinous and prevertebral edema as above. Disc levels: C2-C3: Small right paracentral disc protrusion without stenosis or impingement. Foramina remain patent. C3-C4: Mild disc bulge with uncovertebral and facet hypertrophy. Mild spinal stenosis. No more than mild bilateral foraminal narrowing. C4-C5: Epidural phlegmon/abscess within the ventral  epidural space. Moderate spinal stenosis with flattening of the ventral cord. Evaluation of  the neural foramina limited by motion. C5-C6: Prior ACDF. Persistent ventral epidural abscess/phlegmon. Moderate spinal stenosis with mild cord flattening but no visible cord signal changes. Evaluation of the neural foramina limited by motion. C6-C7: Ventral epidural phlegmon/abscess with mild-to-moderate spinal stenosis. Mild cord flattening without visible cord signal changes. Evaluation of the foramina limited by motion. C7-T1: Changes of osteomyelitis discitis. Bilateral facet spurring, greater on the right. No significant spinal stenosis. Suspected moderate bilateral foraminal narrowing. IMPRESSION: 1. Technically limited exam due to motion artifact. 2. Sequelae of interval ACDF at C5-6. 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 3. Associated ventral epidural phlegmon/abscess extending from C4 through C6-7 with up to moderate spinal stenosis at these levels. No visible cord signal changes on this motion degraded exam. 4. Associated prevertebral and posterior paraspinous edema without visible discrete soft tissue abscess on this motion degraded exam. 5. Evolving acute to subacute pontine infarct, better evaluated on prior brain MRI. Electronically Signed   By: Jeannine Boga M.D.   On: 07/21/2021 02:37   MR Lumbar Spine W Wo Contrast  Result Date: 07/21/2021 CLINICAL DATA:  Initial evaluation for low back pain. EXAM: MRI LUMBAR SPINE WITHOUT AND WITH CONTRAST TECHNIQUE: Multiplanar and multiecho pulse sequences of the lumbar spine were obtained without and with intravenous contrast. CONTRAST:  40m GADAVIST GADOBUTROL 1 MMOL/ML IV SOLN COMPARISON:  MRI from 06/16/2021 FINDINGS: Segmentation: Standard. Lowest well-formed disc space labeled the L5-S1 level. Alignment: Trace retrolisthesis of L2 on L3 and L3 on L4, with 3 mm anterolisthesis of L4 on L5, chronic and facet mediated. Vertebrae: Changes consistent with osteomyelitis discitis present  at T12-L1. No significant epidural phlegmon or abscess. Probable tiny 7 mm abscess along the right margin of the T12-L1 disc space (series 6, image 5). Irregularity and enhancement seen about the partially visualized right SI joint, suspicious for septic arthritis. There is edema and enhancement within the adjacent right iliacus muscle as well as the right posterior soft tissues and musculature. Superimposed abscess measuring 2.2 x 1.4 cm is seen within the right posterior soft tissues at the level of L5 (series 9, image 35). No other convincing evidence for osteomyelitis discitis or septic arthritis. Vertebral body height maintained. Bone marrow signal intensity diffusely heterogeneous. No worrisome osseous lesions. Conus medullaris and cauda equina: Conus extends to the T12-L1 level. Conus and cauda equina appear normal. Paraspinal and other soft tissues: Remainder of the visualized soft tissues demonstrate no other acute finding. 2.7 cm simple left renal cyst noted. Disc levels: T12-L1: Findings consistent with osteomyelitis discitis. Superimposed mild disc bulge with reactive endplate change. No spinal stenosis. Foramina remain patent. L1-2: Mild disc bulge with disc desiccation. Mild right-sided facet hypertrophy. No stenosis. L2-3: Disc bulge with disc desiccation, asymmetric to the left. Moderate bilateral facet hypertrophy. Resultant moderate left with mild right lateral recess narrowing. Central canal remains patent. No significant foraminal stenosis. L3-4: Disc bulge with disc desiccation. Associated reactive endplate spurring. Moderate right worse than left facet hypertrophy. Mild narrowing of the lateral recesses. Central canal remains patent. No significant foraminal stenosis. L4-5: Anterolisthesis. Disc bulge with disc desiccation. Advanced bilateral facet arthrosis. Resultant mild canal with mild to moderate bilateral lateral recess stenosis. Mild bilateral L4 foraminal narrowing. L5-S1: Disc  desiccation with minimal disc bulge. Severe left worse than right facet arthrosis. No stenosis or impingement. IMPRESSION: 1. 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  Probable tiny 7 mm abscess along the right margin of the T12-L1 disc space. No other significant epidural phlegmon or abscess. 2. Acute septic arthritis of the right SI joint. Associated edema and enhancement within the adjacent right posterior paraspinous and iliacus musculature. Superimposed 2.2 x 1.4 cm soft tissue abscess as above. 3. Underlying multilevel lumbar spondylosis and facet arthrosis with resultant mild-to-moderate lateral recess narrowing at L2-3 through L4-5 as above. Electronically Signed   By: Jeannine Boga M.D.   On: 07/21/2021 02:18   CT ABDOMEN PELVIS W CONTRAST  Result Date: 07/20/2021 CLINICAL DATA:  Abdominal pain and fever. EXAM: CT CHEST, ABDOMEN, AND PELVIS WITH CONTRAST TECHNIQUE: Multidetector CT imaging of the chest, abdomen and pelvis was performed following the standard protocol during bolus administration of intravenous contrast. CONTRAST:  89m OMNIPAQUE IOHEXOL 350 MG/ML SOLN COMPARISON:  CT of the abdomen June 27, 2021 FINDINGS: CT CHEST FINDINGS Cardiovascular: Normal heart size. No pericardial effusion. No central pulmonary embolus. No dissection or aneurysmal dilation of the aorta. Mediastinum/Nodes: No enlarged mediastinal, hilar, or axillary lymph nodes. Thyroid gland, trachea, and esophagus demonstrate no significant findings. Lungs/Pleura: Small bilateral pleural effusions. Small amount of airspace consolidation versus atelectasis in the bilateral lung bases. Musculoskeletal: No chest wall mass or suspicious bone lesions identified. CT ABDOMEN PELVIS FINDINGS Hepatobiliary: No focal liver abnormality is seen. No gallstones, gallbladder wall thickening, or biliary dilatation. Pancreas: Unremarkable. No pancreatic ductal dilatation or surrounding inflammatory changes. Spleen:  Normal in size without focal abnormality. Adrenals/Urinary Tract: Normal adrenal glands. Bilateral too small to be actually characterized hypoattenuated masses. Stable cystic appearing lesion in the lower pole of the left kidney. Stomach/Bowel: Gastrostomy tube is appropriately positioned within the stomach without significant surrounding inflammatory changes. No evidence of small-bowel obstruction. Normal caliber of the colon. Vascular/Lymphatic: Aortic atherosclerosis. No enlarged abdominal or pelvic lymph nodes. Reproductive: Status post hysterectomy. No adnexal masses. Other: No abdominal wall hernia or abnormality. No abdominopelvic ascites. Musculoskeletal: There has been an interval development of dystrophic changes involving the inferior endplate of TE67vertebral body and superior endplate of L1 vertebral body. There is an associated narrowing of the intervening intervertebral disc and small amount of soft tissue thickening anteriorly. This finding is new when compared to patient's prior CT dated June 27, 2021 IMPRESSION: 1. Small bilateral pleural effusions. 2. Small amount of airspace consolidation versus atelectasis in the bilateral lung bases. 3. Interval development of dystrophic changes involving the inferior endplate of TJ44vertebral body and superior endplate of L1 vertebral body. There is an associated narrowing of the intervening intervertebral disc and small amount of soft tissue thickening anteriorly. This finding is new when compared to patient's prior CT dated June 27, 2021, and therefore may represent sequela of discitis/osteomyelitis. Differential diagnosis includes asymmetric discogenic osteoarthritic endplate changes. Please correlate to patient's symptoms. If further imaging evaluation is desired, contrast enhanced MRI of the lumbar spine may be considered. 4. Gastrostomy tube appropriately positioned within the stomach without significant surrounding inflammatory changes. 5. Bilateral  too small to be actually characterized hypoattenuated masses in both kidneys. Aortic Atherosclerosis (ICD10-I70.0). Electronically Signed   By: DFidela SalisburyM.D.   On: 07/20/2021 20:28   CT SHOULDER LEFT WO CONTRAST  Result Date: 07/10/2021 CLINICAL DATA:  Acute pain EXAM: CT OF THE UPPER LEFT EXTREMITY WITHOUT CONTRAST TECHNIQUE: Multidetector CT imaging of the upper left extremity was performed according to the standard protocol. COMPARISON:  None. FINDINGS: Bones/Joint/Cartilage There is no evidence of acute fracture or dislocation. The shoulder  is held in internal rotation. There is moderate glenohumeral osteoarthritis with subchondral sclerosis and cystic change. There is preserved glenoid bone stock and neutral glenoid version. There is mild widening of the [facility] joint and mild degenerative change. Partially visualized ACDF hardware. Ligaments Suboptimally assessed by CT. Muscles and Tendons Grade 1 muscle atrophy of the cuff musculature. There is mineralization along the expected course of the coracoclavicular ligaments. Soft tissues No focal fluid collection. There are a few prominent left axillary lymph nodes. IMPRESSION: No acute fracture or dislocation. Moderate glenohumeral osteoarthritis. Widening of the [facility] joint with mild degenerative change and mineralization along the course of the coracoclavicular ligaments, which can be seen with chronic AC joint injury. Electronically Signed   By: Maurine Simmering M.D.   On: 07/10/2021 08:30   CT ELBOW LEFT WO CONTRAST  Result Date: 07/10/2021 CLINICAL DATA:  Acute pain EXAM: CT OF THE UPPER LEFT EXTREMITY WITHOUT CONTRAST TECHNIQUE: Multidetector CT imaging of the upper left extremity was performed according to the standard protocol. COMPARISON:  None. FINDINGS: Bones/Joint/Cartilage There is no acute fracture or dislocation. Elbow alignment is normal. There is no significant degenerative change. There is no joint effusion. Ligaments Suboptimally assessed  by CT. Muscles and Tendons No significant muscle atrophy. Soft tissues There is superficial soft tissue swelling. No focal fluid collection on noncontrast CT. IMPRESSION: Mild generalized superficial soft tissue swelling along the elbow. No acute osseous abnormality or joint effusion. Electronically Signed   By: Maurine Simmering M.D.   On: 07/10/2021 08:34   DG Chest Portable 1 View  Result Date: 07/20/2021 CLINICAL DATA:  Possible sepsis. EXAM: PORTABLE CHEST 1 VIEW COMPARISON:  June 30, 2021 FINDINGS: Cardiomediastinal silhouette is normal. Mediastinal contours appear intact. There is no evidence of focal airspace consolidation, pleural effusion or pneumothorax. Mild diffuse prominence of the interstitial markings. Osseous structures are without acute abnormality. Soft tissues are grossly normal. IMPRESSION: Mild diffuse prominence of the interstitial markings, which may represent pulmonary vascular congestion or early atypical pneumonia. Electronically Signed   By: Fidela Salisbury M.D.   On: 07/20/2021 17:14   ECHOCARDIOGRAM COMPLETE  Result Date: 07/21/2021    ECHOCARDIOGRAM REPORT   Patient Name:   ZELL DOUCETTE Date of Exam: 07/21/2021 Medical Rec #:  [id number]     Height:       66.0 in Accession #:    [id number]    Weight:       185.3 lb Date of Birth:  January 31, 1946      BSA:          1.936 m Patient Age:    32 years      BP:           137/70 mmHg Patient Gender: F             HR:           123 bpm. Exam Location:  Inpatient Procedure: 2D Echo, Cardiac Doppler and Color Doppler Indications:    CVA  History:        Patient has prior history of Echocardiogram examinations, most                 recent 06/18/2021. Risk Factors:Hypertension.  Sonographer:    MH Referring Phys: [id number] [place]  1. Left ventricular ejection fraction, by estimation, is 60 to 65%. The left ventricle has normal function. The left ventricle has no regional wall motion abnormalities. There is mild left ventricular  hypertrophy of the basal-septal segment. Left  ventricular diastolic parameters are indeterminate.  2. Right ventricular systolic function is normal. The right ventricular size is normal. There is mildly elevated pulmonary artery systolic pressure.  3. The mitral valve is normal in structure. Mild mitral valve regurgitation. No evidence of mitral stenosis.  4. The aortic valve is tricuspid. Aortic valve regurgitation is trivial. No aortic stenosis is present.  5. The inferior vena cava is normal in size with greater than 50% respiratory variability, suggesting right atrial pressure of 3 mmHg. FINDINGS  Left Ventricle: Left ventricular ejection fraction, by estimation, is 60 to 65%. The left ventricle has normal function. The left ventricle has no regional wall motion abnormalities. The left ventricular internal cavity size was normal in size. There is  mild left ventricular hypertrophy of the basal-septal segment. Left ventricular diastolic parameters are indeterminate. Right Ventricle: The right ventricular size is normal. Right ventricular systolic function is normal. There is mildly elevated pulmonary artery systolic pressure. The tricuspid regurgitant velocity is 3.04 m/s, and with an assumed right atrial pressure of 3 mmHg, the estimated right ventricular systolic pressure is 20.3 mmHg. Left Atrium: Left atrial size was normal in size. Right Atrium: Right atrial size was normal in size. Pericardium: There is no evidence of pericardial effusion. Presence of pericardial fat pad. Mitral Valve: The mitral valve is normal in structure. Mild mitral valve regurgitation. No evidence of mitral valve stenosis. Tricuspid Valve: The tricuspid valve is normal in structure. Tricuspid valve regurgitation is mild . No evidence of tricuspid stenosis. Aortic Valve: The aortic valve is tricuspid. Aortic valve regurgitation is trivial. No aortic stenosis is present. Aortic valve mean gradient measures 5.0 mmHg. Aortic valve peak  gradient measures 10.8 mmHg. Aortic valve area, by VTI measures 2.25 cm. Pulmonic Valve: The pulmonic valve was normal in structure. Pulmonic valve regurgitation is not visualized. No evidence of pulmonic stenosis. Aorta: The aortic root is normal in size and structure. Venous: The inferior vena cava is normal in size with greater than 50% respiratory variability, suggesting right atrial pressure of 3 mmHg. IAS/Shunts: No atrial level shunt detected by color flow Doppler.  LEFT VENTRICLE PLAX 2D LVIDd:         4.50 cm     Diastology LVIDs:         3.20 cm     LV e' medial:  9.14 cm/s LV PW:         1.40 cm     LV e' lateral: 8.38 cm/s LV IVS:        1.60 cm LVOT diam:     2.10 cm LV SV:         67 LV SV Index:   34 LVOT Area:     3.46 cm  LV Volumes (MOD) LV vol d, MOD A4C: 36.3 ml LV vol s, MOD A4C: 14.2 ml LV SV MOD A4C:     36.3 ml RIGHT VENTRICLE             IVC RV S prime:     15.70 cm/s  IVC diam: 1.70 cm TAPSE (M-mode): 2.3 cm LEFT ATRIUM             Index       RIGHT ATRIUM           Index LA diam:        3.00 cm 1.55 cm/m  RA Area:     10.10 cm LA Vol (A2C):   33.2 ml 17.15 ml/m RA Volume:   21.00 ml  10.85 ml/m LA Vol (A4C):   27.8 ml 14.36 ml/m LA Biplane Vol: 31.9 ml 16.48 ml/m  AORTIC VALVE AV Area (Vmax):    2.60 cm AV Area (Vmean):   2.45 cm AV Area (VTI):     2.25 cm AV Vmax:           164.00 cm/s AV Vmean:          107.000 cm/s AV VTI:            0.295 m AV Peak Grad:      10.8 mmHg AV Mean Grad:      5.0 mmHg LVOT Vmax:         123.00 cm/s LVOT Vmean:        75.800 cm/s LVOT VTI:          0.192 m LVOT/AV VTI ratio: 0.65  AORTA Ao Root diam: 2.90 cm Ao Asc diam:  2.60 cm MR Peak grad: 163.3 mmHg  TRICUSPID VALVE MR Vmax:      639.00 cm/s TR Peak grad:   37.0 mmHg                           TR Vmax:        304.00 cm/s                            SHUNTS                           Systemic VTI:  0.19 m                           Systemic Diam: 2.10 cm Kirk Ruths MD Electronically signed by  Kirk Ruths MD Signature Date/Time: 07/21/2021/11:43:12 AM    Final    [place] EKG SITE RITE  Result Date: 08/06/2021 If Site Rite image not attached, placement could not be confirmed due to current cardiac rhythm.  [place] EKG SITE RITE  Result Date: 07/25/2021 If Site Rite image not attached, placement could not be confirmed due to current cardiac rhythm.   Labs: BNP (last 3 results) Recent Labs    07/06/21 0217 07/20/21 2147  BNP 452.3* 974.1*   Basic Metabolic Panel: Recent Labs  Lab 07/31/21 0445 08/01/21 0350 08/03/21 0603 08/05/21 0706  NA 138 139 141 141  K 3.9 4.0 4.1 4.6  CL 109 108 110 109  CO2 20* 22 24 21*  GLUCOSE 103* 89 92 88  BUN 30* 25* 25* 31*  CREATININE 0.72 0.69 0.72 0.85  CALCIUM 8.3* 8.8* 8.7* 8.7*   Liver Function Tests: No results for input(s): AST, ALT, ALKPHOS, BILITOT, PROT, ALBUMIN in the last 168 hours. No results for input(s): LIPASE, AMYLASE in the last 168 hours. No results for input(s): AMMONIA in the last 168 hours. CBC: Recent Labs  Lab 07/31/21 0445 08/01/21 0350 08/03/21 0603 08/05/21 0706  WBC 9.6 11.6* 10.6* 10.9*  HGB 7.0* 7.8* 7.7* 8.9*  HCT 22.5* 24.8* 24.5* 29.1*  MCV 101.8* 100.4* 102.5* 104.3*  PLT 216 227 198 200   Cardiac Enzymes: No results for input(s): CKTOTAL, CKMB, CKMBINDEX, TROPONINI in the last 168 hours. BNP: Invalid input(s): POCBNP CBG: Recent Labs  Lab 08/05/21 1646 08/05/21 2005 08/05/21 2317 08/06/21 0353 08/06/21 0829  GLUCAP 90 88 77 82 88   D-Dimer No results for input(s): DDIMER  in the last 72 hours. Hgb A1c No results for input(s): HGBA1C in the last 72 hours. Lipid Profile No results for input(s): CHOL, HDL, LDLCALC, TRIG, CHOLHDL, LDLDIRECT in the last 72 hours. Thyroid function studies No results for input(s): TSH, T4TOTAL, T3FREE, THYROIDAB in the last 72 hours.  Invalid input(s): FREET3 Anemia work up No results for input(s): VITAMINB12, FOLATE, FERRITIN, TIBC, IRON, RETICCTPCT  in the last 72 hours. Urinalysis    Component Value Date/Time   COLORURINE YELLOW 07/20/2021 1654   APPEARANCEUR CLEAR 07/20/2021 1654   LABSPEC 1.010 07/20/2021 1654   PHURINE 7.0 07/20/2021 1654   GLUCOSEU NEGATIVE 07/20/2021 1654   HGBUR LARGE (A) 07/20/2021 1654   BILIRUBINUR NEGATIVE 07/20/2021 1654   KETONESUR NEGATIVE 07/20/2021 1654   PROTEINUR NEGATIVE 07/20/2021 1654   UROBILINOGEN 0.2 05/14/2008 0020   NITRITE POSITIVE (A) 07/20/2021 1654   LEUKOCYTESUR MODERATE (A) 07/20/2021 1654   Sepsis Labs Invalid input(s): PROCALCITONIN,  WBC,  LACTICIDVEN Microbiology Recent Results (from the past 240 hour(s))  SARS CORONAVIRUS 2 (TAT 6-24 HRS) Nasopharyngeal Nasopharyngeal Swab     Status: None   Collection Time: 08/05/21  5:04 PM   Specimen: Nasopharyngeal Swab  Result Value Ref Range Status   SARS Coronavirus 2 NEGATIVE NEGATIVE Final    Comment: (NOTE) SARS-CoV-2 target nucleic acids are NOT DETECTED.  The SARS-CoV-2 RNA is generally detectable in upper and lower respiratory specimens during the acute phase of infection. Negative results do not preclude SARS-CoV-2 infection, do not rule out co-infections with other pathogens, and should not be used as the sole basis for treatment or other patient management decisions. Negative results must be combined with clinical observations, patient history, and epidemiological information. The expected result is Negative.  Fact Sheet for Patients: SugarRoll.be  Fact Sheet for Healthcare Providers: https://www.woods-mathews.com/  This test is not yet approved or cleared by the [place] FDA and  has been authorized for detection and/or diagnosis of SARS-CoV-2 by FDA under an Emergency Use Authorization (EUA). This EUA will remain  in effect (meaning this test can be used) for the duration of the COVID-19 declaration under Se ction 564(b)(1) of the Act, 21 U.S.C. section  360bbb-3(b)(1), unless the authorization is terminated or revoked sooner.  Performed at [facility], [place] [address] [place], [place] [address]      Time coordinating discharge: 35 minutes  SIGNED: Antonieta Pert, MD  [facility] 08/06/2021, 10:05 AM  If 7PM-7AM, please contact night-coverage [email or web address]

## 2021-08-06 NOTE — Progress Notes (Signed)
Peripherally Inserted Central Catheter Placement  The IV Nurse has discussed with the patient and/or persons authorized to consent for the patient, the purpose of this procedure and the potential benefits and risks involved with this procedure.  The benefits include less needle sticks, lab draws from the catheter, and the patient may be discharged home with the catheter. Risks include, but not limited to, infection, bleeding, blood clot (thrombus formation), and puncture of an artery; nerve damage and irregular heartbeat and possibility to perform a PICC exchange if needed/ordered by physician.  Alternatives to this procedure were also discussed.  Bard Power PICC patient education guide, fact sheet on infection prevention and patient information card has been provided to patient /or left at bedside.  PICC inserted by Arlina Robes, RN  PICC Placement Documentation  PICC Single Lumen 08/06/21 Right Basilic 35 cm 0 cm (Active)  Indication for Insertion or Continuance of Line Home intravenous therapies (PICC only) 08/06/21 1135  Exposed Catheter (cm) 0 cm 08/06/21 1135  Site Assessment Clean;Dry;Intact 08/06/21 1135  Line Status Flushed;Saline locked;Blood return noted 08/06/21 1135  Dressing Type Transparent 08/06/21 1135  Dressing Status Clean;Dry;Intact 08/06/21 1135  Antimicrobial disc in place? Yes 08/06/21 1135  Safety Lock Not Applicable 08/06/21 1135  Line Care Connections checked and tightened 08/06/21 1135  Dressing Intervention New dressing 08/06/21 1135  Dressing Change Due 08/13/21 08/06/21 1135       Trip Cavanagh, Lajean Manes 08/06/2021, 11:37 AM

## 2021-08-06 NOTE — TOC Transition Note (Signed)
Transition of Care [facility]) - CM/SW Discharge Note   Patient Details  Name: Kristy [name] MRN: [id number] Date of Birth: [birth year]  [facility]) CM/SW Contact:  Lorri Frederick, LCSW Phone Number: 08/06/2021, 12:04 PM   Clinical Narrative:   Pt discharging to [facility].  RN call report to [phone].    Final next level of care: Skilled Nursing Facility Barriers to Discharge: Barriers Resolved   Patient Goals and CMS Choice Patient states their goals for this hospitalization and ongoing recovery are:: Pt's daughter stating her goal is to get pt into a different SNF. CMS Medicare.gov Compare Post Acute Care list provided to:: Patient Represenative (must comment) (Daughter, [name]) Choice offered to / list presented to : Adult Children  Discharge Placement              Patient chooses bed at:  [facility]) Patient to be transferred to facility by: [facility] Name of family member notified: daughter Selena Batten Patient and family notified of of transfer: 08/06/21  Discharge Plan and Services     Post Acute Care Choice: Skilled Nursing Facility                               Social Determinants of Health (SDOH) Interventions     Readmission Risk Interventions No flowsheet data found.

## 2021-08-06 NOTE — TOC Progression Note (Signed)
Transition of Care [facility]) - Progression Note    Patient Details  Name: Kristy [name] MRN: [id number] Date of Birth: June 15, 1946  Transition of Care [facility]) CM/SW Contact  Lorri Frederick, LCSW Phone Number: 08/06/2021, 8:52 AM  Clinical Narrative:   CSW confirmed with [name] at [name] are ready to receive pt today.  She asked CSW to communicate with family that pt has used 9 of her fully covered days.  CSW spoke with daughter [name] and informed her of 9 of pt fully covered days already used.  She verbalized understanding and had several other medical questions.  MD informed.    Expected Discharge Plan: Skilled Nursing Facility Barriers to Discharge: Continued Medical Work up, SNF Pending bed offer  Expected Discharge Plan and Services Expected Discharge Plan: Skilled Nursing Facility     Post Acute Care Choice: Skilled Nursing Facility Living arrangements for the past 2 months: Skilled Nursing Facility                                       Social Determinants of Health (SDOH) Interventions    Readmission Risk Interventions No flowsheet data found.

## 2021-08-09 ENCOUNTER — Inpatient Hospital Stay: Payer: Self-pay | Admitting: Internal Medicine

## 2021-08-28 ENCOUNTER — Emergency Department (HOSPITAL_COMMUNITY): Payer: Medicare Other

## 2021-08-28 ENCOUNTER — Inpatient Hospital Stay (HOSPITAL_COMMUNITY): Payer: Medicare Other

## 2021-08-28 ENCOUNTER — Inpatient Hospital Stay (HOSPITAL_COMMUNITY)
Admission: EM | Admit: 2021-08-28 | Discharge: 2021-09-17 | DRG: 871 | Disposition: A | Discharge status: Home Health | Payer: Medicare Other | Attending: Internal Medicine | Admitting: Internal Medicine

## 2021-08-28 DIAGNOSIS — J9601 Acute respiratory failure with hypoxia: Secondary | ICD-10-CM | POA: Diagnosis present

## 2021-08-28 DIAGNOSIS — I5021 Acute systolic (congestive) heart failure: Secondary | ICD-10-CM | POA: Diagnosis present

## 2021-08-28 DIAGNOSIS — Z8673 Personal history of transient ischemic attack (TIA), and cerebral infarction without residual deficits: Secondary | ICD-10-CM

## 2021-08-28 DIAGNOSIS — R627 Adult failure to thrive: Secondary | ICD-10-CM | POA: Diagnosis present

## 2021-08-28 DIAGNOSIS — E870 Hyperosmolality and hypernatremia: Secondary | ICD-10-CM | POA: Diagnosis not present

## 2021-08-28 DIAGNOSIS — J189 Pneumonia, unspecified organism: Secondary | ICD-10-CM | POA: Diagnosis present

## 2021-08-28 DIAGNOSIS — I110 Hypertensive heart disease with heart failure: Secondary | ICD-10-CM | POA: Diagnosis present

## 2021-08-28 DIAGNOSIS — Z6827 Body mass index (BMI) 27.0-27.9, adult: Secondary | ICD-10-CM

## 2021-08-28 DIAGNOSIS — Z792 Long term (current) use of antibiotics: Secondary | ICD-10-CM

## 2021-08-28 DIAGNOSIS — Z90710 Acquired absence of both cervix and uterus: Secondary | ICD-10-CM

## 2021-08-28 DIAGNOSIS — I472 Ventricular tachycardia, unspecified: Secondary | ICD-10-CM | POA: Diagnosis present

## 2021-08-28 DIAGNOSIS — Z95828 Presence of other vascular implants and grafts: Secondary | ICD-10-CM

## 2021-08-28 DIAGNOSIS — E878 Other disorders of electrolyte and fluid balance, not elsewhere classified: Secondary | ICD-10-CM | POA: Diagnosis present

## 2021-08-28 DIAGNOSIS — E43 Unspecified severe protein-calorie malnutrition: Secondary | ICD-10-CM | POA: Diagnosis present

## 2021-08-28 DIAGNOSIS — Z87440 Personal history of urinary (tract) infections: Secondary | ICD-10-CM

## 2021-08-28 DIAGNOSIS — R6521 Severe sepsis with septic shock: Secondary | ICD-10-CM | POA: Diagnosis present

## 2021-08-28 DIAGNOSIS — D6959 Other secondary thrombocytopenia: Secondary | ICD-10-CM | POA: Diagnosis present

## 2021-08-28 DIAGNOSIS — Z4659 Encounter for fitting and adjustment of other gastrointestinal appliance and device: Secondary | ICD-10-CM

## 2021-08-28 DIAGNOSIS — N390 Urinary tract infection, site not specified: Secondary | ICD-10-CM | POA: Diagnosis present

## 2021-08-28 DIAGNOSIS — A419 Sepsis, unspecified organism: Secondary | ICD-10-CM

## 2021-08-28 DIAGNOSIS — G931 Anoxic brain damage, not elsewhere classified: Secondary | ICD-10-CM | POA: Diagnosis present

## 2021-08-28 DIAGNOSIS — G928 Other toxic encephalopathy: Secondary | ICD-10-CM | POA: Diagnosis present

## 2021-08-28 DIAGNOSIS — J9691 Respiratory failure, unspecified with hypoxia: Secondary | ICD-10-CM

## 2021-08-28 DIAGNOSIS — J690 Pneumonitis due to inhalation of food and vomit: Secondary | ICD-10-CM | POA: Diagnosis present

## 2021-08-28 DIAGNOSIS — Z79899 Other long term (current) drug therapy: Secondary | ICD-10-CM

## 2021-08-28 DIAGNOSIS — I9589 Other hypotension: Secondary | ICD-10-CM | POA: Diagnosis present

## 2021-08-28 DIAGNOSIS — M4620 Osteomyelitis of vertebra, site unspecified: Secondary | ICD-10-CM | POA: Diagnosis present

## 2021-08-28 DIAGNOSIS — R296 Repeated falls: Secondary | ICD-10-CM | POA: Diagnosis present

## 2021-08-28 DIAGNOSIS — I469 Cardiac arrest, cause unspecified: Secondary | ICD-10-CM

## 2021-08-28 DIAGNOSIS — G9341 Metabolic encephalopathy: Secondary | ICD-10-CM

## 2021-08-28 DIAGNOSIS — E039 Hypothyroidism, unspecified: Secondary | ICD-10-CM | POA: Diagnosis not present

## 2021-08-28 DIAGNOSIS — D638 Anemia in other chronic diseases classified elsewhere: Secondary | ICD-10-CM | POA: Diagnosis present

## 2021-08-28 DIAGNOSIS — I462 Cardiac arrest due to underlying cardiac condition: Secondary | ICD-10-CM | POA: Diagnosis present

## 2021-08-28 DIAGNOSIS — L89152 Pressure ulcer of sacral region, stage 2: Secondary | ICD-10-CM | POA: Diagnosis present

## 2021-08-28 DIAGNOSIS — E876 Hypokalemia: Secondary | ICD-10-CM | POA: Diagnosis not present

## 2021-08-28 DIAGNOSIS — R5381 Other malaise: Secondary | ICD-10-CM | POA: Diagnosis present

## 2021-08-28 DIAGNOSIS — L309 Dermatitis, unspecified: Secondary | ICD-10-CM | POA: Diagnosis present

## 2021-08-28 DIAGNOSIS — L89312 Pressure ulcer of right buttock, stage 2: Secondary | ICD-10-CM | POA: Diagnosis present

## 2021-08-28 DIAGNOSIS — I429 Cardiomyopathy, unspecified: Secondary | ICD-10-CM | POA: Diagnosis present

## 2021-08-28 DIAGNOSIS — E871 Hypo-osmolality and hyponatremia: Secondary | ICD-10-CM | POA: Diagnosis not present

## 2021-08-28 DIAGNOSIS — N170 Acute kidney failure with tubular necrosis: Secondary | ICD-10-CM | POA: Diagnosis present

## 2021-08-28 DIAGNOSIS — B9689 Other specified bacterial agents as the cause of diseases classified elsewhere: Secondary | ICD-10-CM | POA: Diagnosis present

## 2021-08-28 DIAGNOSIS — L409 Psoriasis, unspecified: Secondary | ICD-10-CM | POA: Diagnosis present

## 2021-08-28 DIAGNOSIS — E785 Hyperlipidemia, unspecified: Secondary | ICD-10-CM | POA: Diagnosis present

## 2021-08-28 DIAGNOSIS — W44F3XA Food entering into or through a natural orifice, initial encounter: Secondary | ICD-10-CM

## 2021-08-28 DIAGNOSIS — Z20822 Contact with and (suspected) exposure to covid-19: Secondary | ICD-10-CM | POA: Diagnosis present

## 2021-08-28 DIAGNOSIS — Z931 Gastrostomy status: Secondary | ICD-10-CM

## 2021-08-28 DIAGNOSIS — I10 Essential (primary) hypertension: Secondary | ICD-10-CM | POA: Diagnosis present

## 2021-08-28 DIAGNOSIS — F1010 Alcohol abuse, uncomplicated: Secondary | ICD-10-CM | POA: Diagnosis present

## 2021-08-28 DIAGNOSIS — Z7982 Long term (current) use of aspirin: Secondary | ICD-10-CM

## 2021-08-28 DIAGNOSIS — L8990 Pressure ulcer of unspecified site, unspecified stage: Secondary | ICD-10-CM | POA: Insufficient documentation

## 2021-08-28 DIAGNOSIS — R1310 Dysphagia, unspecified: Secondary | ICD-10-CM | POA: Diagnosis present

## 2021-08-28 DIAGNOSIS — R4182 Altered mental status, unspecified: Secondary | ICD-10-CM

## 2021-08-28 DIAGNOSIS — R339 Retention of urine, unspecified: Secondary | ICD-10-CM | POA: Diagnosis present

## 2021-08-28 DIAGNOSIS — Z960 Presence of urogenital implants: Secondary | ICD-10-CM | POA: Diagnosis present

## 2021-08-28 DIAGNOSIS — R532 Functional quadriplegia: Secondary | ICD-10-CM | POA: Diagnosis present

## 2021-08-28 DIAGNOSIS — E162 Hypoglycemia, unspecified: Secondary | ICD-10-CM | POA: Diagnosis not present

## 2021-08-28 DIAGNOSIS — Z452 Encounter for adjustment and management of vascular access device: Secondary | ICD-10-CM

## 2021-08-28 DIAGNOSIS — T17928A Food in respiratory tract, part unspecified causing other injury, initial encounter: Secondary | ICD-10-CM

## 2021-08-28 DIAGNOSIS — Z8661 Personal history of infections of the central nervous system: Secondary | ICD-10-CM

## 2021-08-28 DIAGNOSIS — A4159 Other Gram-negative sepsis: Secondary | ICD-10-CM | POA: Diagnosis present

## 2021-08-28 DIAGNOSIS — E875 Hyperkalemia: Secondary | ICD-10-CM | POA: Diagnosis present

## 2021-08-28 LAB — COMPREHENSIVE METABOLIC PANEL
ALT: 5 U/L (ref 0–44)
AST: 23 U/L (ref 15–41)
Albumin: 2.6 g/dL — ABNORMAL LOW (ref 3.5–5.0)
Alkaline Phosphatase: 108 U/L (ref 38–126)
Anion gap: 6 (ref 5–15)
BUN: 49 mg/dL — ABNORMAL HIGH (ref 8–23)
CO2: 26 mmol/L (ref 22–32)
Calcium: 9.5 mg/dL (ref 8.9–10.3)
Chloride: 105 mmol/L (ref 98–111)
Creatinine, Ser: 1.14 mg/dL — ABNORMAL HIGH (ref 0.44–1.00)
GFR, Estimated: 50 mL/min — ABNORMAL LOW (ref >60)
Glucose, Bld: 86 mg/dL (ref 70–99)
Potassium: 5.7 mmol/L — ABNORMAL HIGH (ref 3.5–5.1)
Sodium: 137 mmol/L (ref 135–145)
Total Bilirubin: 0.6 mg/dL (ref 0.3–1.2)
Total Protein: 7.3 g/dL (ref 6.5–8.1)

## 2021-08-28 LAB — RESP PANEL BY RT-PCR (FLU A&B, COVID) ARPGX2
Influenza A by PCR: NEGATIVE
Influenza B by PCR: NEGATIVE
SARS Coronavirus 2 by RT PCR: NEGATIVE

## 2021-08-28 LAB — AMMONIA: Ammonia: 11 umol/L (ref 9–35)

## 2021-08-28 LAB — CBC WITH DIFFERENTIAL/PLATELET
Abs Immature Granulocytes: 0.06 10*3/uL (ref 0.00–0.07)
Basophils Absolute: 0 10*3/uL (ref 0.0–0.1)
Basophils Relative: 0 %
Eosinophils Absolute: 0.1 10*3/uL (ref 0.0–0.5)
Eosinophils Relative: 2 %
HCT: 31.3 % — ABNORMAL LOW (ref 36.0–46.0)
Hemoglobin: 9.1 g/dL — ABNORMAL LOW (ref 12.0–15.0)
Immature Granulocytes: 1 %
Lymphocytes Relative: 7 %
Lymphs Abs: 0.5 10*3/uL — ABNORMAL LOW (ref 0.7–4.0)
MCH: 32.9 pg (ref 26.0–34.0)
MCHC: 29.1 g/dL — ABNORMAL LOW (ref 30.0–36.0)
MCV: 113 fL — ABNORMAL HIGH (ref 80.0–100.0)
Monocytes Absolute: 0.4 10*3/uL (ref 0.1–1.0)
Monocytes Relative: 6 %
Neutro Abs: 5.7 10*3/uL (ref 1.7–7.7)
Neutrophils Relative %: 84 %
Platelets: 84 10*3/uL — ABNORMAL LOW (ref 150–400)
RBC: 2.77 MIL/uL — ABNORMAL LOW (ref 3.87–5.11)
RDW: 18.2 % — ABNORMAL HIGH (ref 11.5–15.5)
Smear Review: DECREASED
WBC: 6.7 10*3/uL (ref 4.0–10.5)
nRBC: 0.6 % — ABNORMAL HIGH (ref 0.0–0.2)

## 2021-08-28 LAB — I-STAT ARTERIAL BLOOD GAS, ED
Acid-base deficit: 4 mmol/L — ABNORMAL HIGH (ref 0.0–2.0)
Bicarbonate: 22.1 mmol/L (ref 20.0–28.0)
Calcium, Ion: 1.16 mmol/L (ref 1.15–1.40)
HCT: 25 % — ABNORMAL LOW (ref 36.0–46.0)
Hemoglobin: 8.5 g/dL — ABNORMAL LOW (ref 12.0–15.0)
O2 Saturation: 100 %
Potassium: 3.9 mmol/L (ref 3.5–5.1)
Sodium: 144 mmol/L (ref 135–145)
TCO2: 24 mmol/L (ref 22–32)
pCO2 arterial: 45.7 mmHg (ref 32.0–48.0)
pH, Arterial: 7.293 — ABNORMAL LOW (ref 7.350–7.450)
pO2, Arterial: 446 mmHg — ABNORMAL HIGH (ref 83.0–108.0)

## 2021-08-28 LAB — PROTIME-INR
INR: 1.2 (ref 0.8–1.2)
Prothrombin Time: 15.1 seconds (ref 11.4–15.2)

## 2021-08-28 LAB — COOXEMETRY PANEL
Carboxyhemoglobin: 1.7 % — ABNORMAL HIGH (ref 0.5–1.5)
Methemoglobin: 0.8 % (ref 0.0–1.5)
O2 Saturation: 83.8 %
Total hemoglobin: 8.8 g/dL — ABNORMAL LOW (ref 12.0–16.0)

## 2021-08-28 LAB — URINALYSIS, ROUTINE W REFLEX MICROSCOPIC
Bilirubin Urine: NEGATIVE
Glucose, UA: NEGATIVE mg/dL
Ketones, ur: NEGATIVE mg/dL
Nitrite: NEGATIVE
Protein, ur: NEGATIVE mg/dL
Specific Gravity, Urine: 1.02 (ref 1.005–1.030)
pH: 5.5 (ref 5.0–8.0)

## 2021-08-28 LAB — I-STAT VENOUS BLOOD GAS, ED
Acid-base deficit: 2 mmol/L (ref 0.0–2.0)
Bicarbonate: 24.9 mmol/L (ref 20.0–28.0)
Calcium, Ion: 1.21 mmol/L (ref 1.15–1.40)
HCT: 28 % — ABNORMAL LOW (ref 36.0–46.0)
Hemoglobin: 9.5 g/dL — ABNORMAL LOW (ref 12.0–15.0)
O2 Saturation: 99 %
Potassium: 4.3 mmol/L (ref 3.5–5.1)
Sodium: 143 mmol/L (ref 135–145)
TCO2: 26 mmol/L (ref 22–32)
pCO2, Ven: 50.5 mmHg (ref 44.0–60.0)
pH, Ven: 7.3 (ref 7.250–7.430)
pO2, Ven: 168 mmHg — ABNORMAL HIGH (ref 32.0–45.0)

## 2021-08-28 LAB — URINALYSIS, MICROSCOPIC (REFLEX)

## 2021-08-28 LAB — GLUCOSE, CAPILLARY
Glucose-Capillary: 116 mg/dL — ABNORMAL HIGH (ref 70–99)
Glucose-Capillary: 118 mg/dL — ABNORMAL HIGH (ref 70–99)
Glucose-Capillary: 121 mg/dL — ABNORMAL HIGH (ref 70–99)
Glucose-Capillary: 143 mg/dL — ABNORMAL HIGH (ref 70–99)
Glucose-Capillary: 50 mg/dL — ABNORMAL LOW (ref 70–99)
Glucose-Capillary: 52 mg/dL — ABNORMAL LOW (ref 70–99)
Glucose-Capillary: 54 mg/dL — ABNORMAL LOW (ref 70–99)
Glucose-Capillary: 58 mg/dL — ABNORMAL LOW (ref 70–99)
Glucose-Capillary: 64 mg/dL — ABNORMAL LOW (ref 70–99)

## 2021-08-28 LAB — BLOOD CULTURE ID PANEL (REFLEXED) - BCID2
A.calcoaceticus-baumannii: DETECTED — AB
Bacteroides fragilis: NOT DETECTED
CTX-M ESBL: NOT DETECTED
Candida albicans: NOT DETECTED
Candida auris: NOT DETECTED
Candida glabrata: NOT DETECTED
Candida krusei: NOT DETECTED
Candida parapsilosis: NOT DETECTED
Candida tropicalis: NOT DETECTED
Carbapenem resistance IMP: NOT DETECTED
Carbapenem resistance KPC: NOT DETECTED
Carbapenem resistance NDM: NOT DETECTED
Carbapenem resistance VIM: NOT DETECTED
Cryptococcus neoformans/gattii: NOT DETECTED
Enterobacter cloacae complex: NOT DETECTED
Enterobacterales: NOT DETECTED
Enterococcus Faecium: NOT DETECTED
Enterococcus faecalis: NOT DETECTED
Escherichia coli: NOT DETECTED
Haemophilus influenzae: NOT DETECTED
Klebsiella aerogenes: NOT DETECTED
Klebsiella oxytoca: NOT DETECTED
Klebsiella pneumoniae: NOT DETECTED
Listeria monocytogenes: NOT DETECTED
Neisseria meningitidis: NOT DETECTED
Proteus species: NOT DETECTED
Pseudomonas aeruginosa: NOT DETECTED
Salmonella species: NOT DETECTED
Serratia marcescens: NOT DETECTED
Staphylococcus aureus (BCID): NOT DETECTED
Staphylococcus epidermidis: NOT DETECTED
Staphylococcus lugdunensis: NOT DETECTED
Staphylococcus species: DETECTED — AB
Stenotrophomonas maltophilia: NOT DETECTED
Streptococcus agalactiae: NOT DETECTED
Streptococcus pneumoniae: NOT DETECTED
Streptococcus pyogenes: NOT DETECTED
Streptococcus species: NOT DETECTED

## 2021-08-28 LAB — APTT: aPTT: 36 seconds (ref 24–36)

## 2021-08-28 LAB — BASIC METABOLIC PANEL
Anion gap: 8 (ref 5–15)
BUN: 46 mg/dL — ABNORMAL HIGH (ref 8–23)
CO2: 22 mmol/L (ref 22–32)
Calcium: 8.8 mg/dL — ABNORMAL LOW (ref 8.9–10.3)
Chloride: 110 mmol/L (ref 98–111)
Creatinine, Ser: 1.11 mg/dL — ABNORMAL HIGH (ref 0.44–1.00)
GFR, Estimated: 52 mL/min — ABNORMAL LOW (ref >60)
Glucose, Bld: 166 mg/dL — ABNORMAL HIGH (ref 70–99)
Potassium: 4.3 mmol/L (ref 3.5–5.1)
Sodium: 140 mmol/L (ref 135–145)

## 2021-08-28 LAB — ECHOCARDIOGRAM LIMITED
Area-P 1/2: 3.17 cm2
Calc EF: 41.3 %
Height: 66 in
S' Lateral: 3.9 cm
Single Plane A2C EF: 46.4 %
Single Plane A4C EF: 36.3 %
Weight: 2968 oz

## 2021-08-28 LAB — CBC
HCT: 30 % — ABNORMAL LOW (ref 36.0–46.0)
Hemoglobin: 8.7 g/dL — ABNORMAL LOW (ref 12.0–15.0)
MCH: 32.5 pg (ref 26.0–34.0)
MCHC: 29 g/dL — ABNORMAL LOW (ref 30.0–36.0)
MCV: 111.9 fL — ABNORMAL HIGH (ref 80.0–100.0)
Platelets: 81 10*3/uL — ABNORMAL LOW (ref 150–400)
RBC: 2.68 MIL/uL — ABNORMAL LOW (ref 3.87–5.11)
RDW: 18 % — ABNORMAL HIGH (ref 11.5–15.5)
WBC: 6.5 10*3/uL (ref 4.0–10.5)
nRBC: 0.9 % — ABNORMAL HIGH (ref 0.0–0.2)

## 2021-08-28 LAB — LACTIC ACID, PLASMA
Lactic Acid, Venous: 1.1 mmol/L (ref 0.5–1.9)
Lactic Acid, Venous: 2.6 mmol/L (ref 0.5–1.9)
Lactic Acid, Venous: 2 mmol/L (ref 0.5–1.9)

## 2021-08-28 LAB — MAGNESIUM: Magnesium: 2.1 mg/dL (ref 1.7–2.4)

## 2021-08-28 LAB — TROPONIN I (HIGH SENSITIVITY)
Troponin I (High Sensitivity): 11 ng/L (ref <18)
Troponin I (High Sensitivity): 15 ng/L (ref <18)

## 2021-08-28 LAB — BRAIN NATRIURETIC PEPTIDE: B Natriuretic Peptide: 482.1 pg/mL — ABNORMAL HIGH (ref 0.0–100.0)

## 2021-08-28 IMAGING — DX DG CHEST 1V PORT
1 series · 1 of 1 positions shown · non-contrast
Comparison: [DATE]

CLINICAL DATA: Encounter for questionable sepsis, evaluate for
abnormality

EXAM:
PORTABLE CHEST - 1 VIEW

[chest ap]
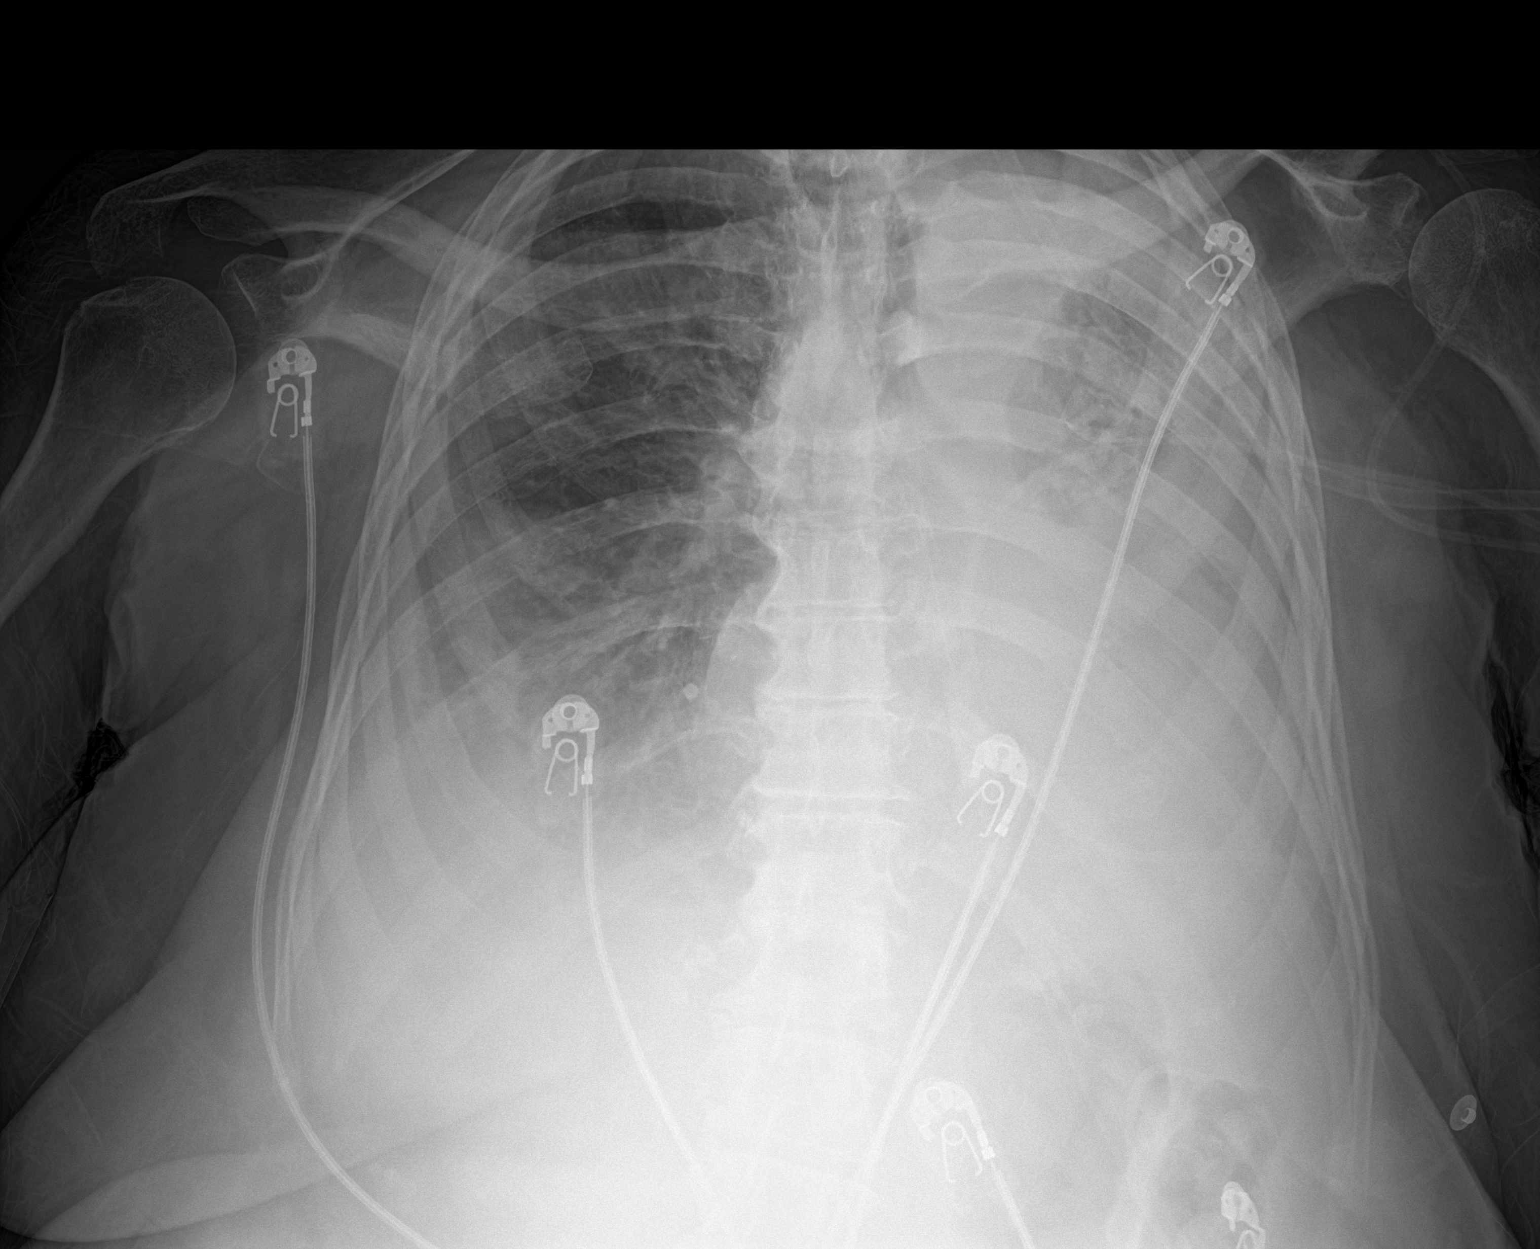

[1 of 1 positions shown; findings below may reference images not displayed]

FINDINGS: Interval development of large left pleural effusion with near
complete consolidation/atelectasis at the left lung, only a small
aerated segment of the upper lobe visible. There is some leftward
mediastinal shift suggesting a degree of volume loss, raising
concern of central obstructing lesion.

Smaller right pleural effusion with some atelectasis/consolidation
in the right lower lung.

Heart size difficult to assess due to adjacent opacities. No
pneumothorax.

Spinal fixation hardware partially visualized. No acute bone
abnormalities noted.
IMPRESSION: Near complete opacification of left hemithorax with volume loss,
suggesting possible central obstructing lesion and effusion.
Consider CT chest with contrast for further evaluation

## 2021-08-28 IMAGING — CT CT CHEST W/O CM
2 of 3 series · 15 of 36 positions shown, 18 images · non-contrast
Comparison: [DATE]

CLINICAL DATA: Chest pain, shortness of breath, post cardiac arrest

EXAM:
CT CHEST WITHOUT CONTRAST
TECHNIQUE: Multidetector CT imaging of the chest was performed following the
standard protocol without IV contrast.

[Series 3: chest w/o 2mm st · axial · non-contrast · 0.69mm/px · z∈[-497,-243]mm · 12 of 151 slices shown, 15 images]
[im 12/151  mediastinal]
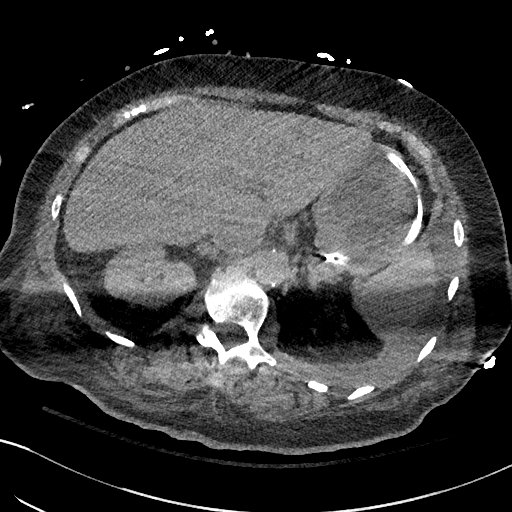
[im 12/151  lung]
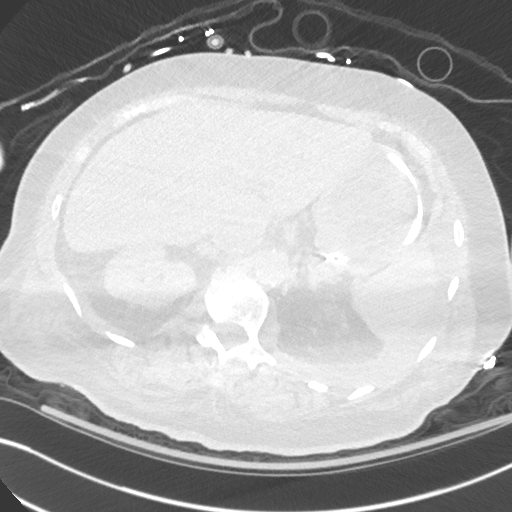
[im 23/151  lung]
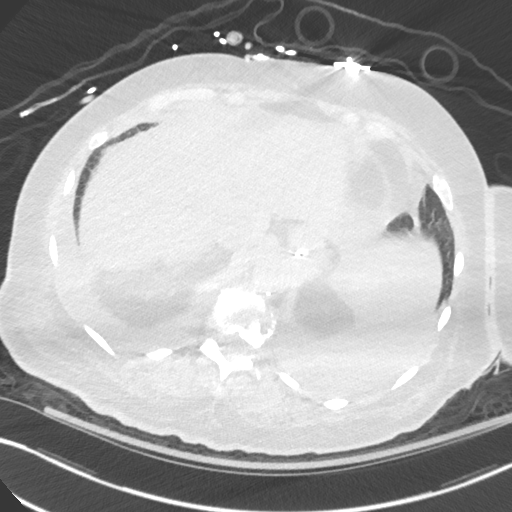
[im 34/151  lung]
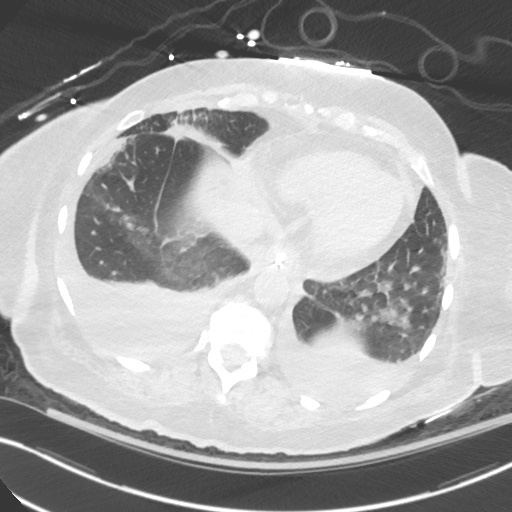
[im 45/151  lung]
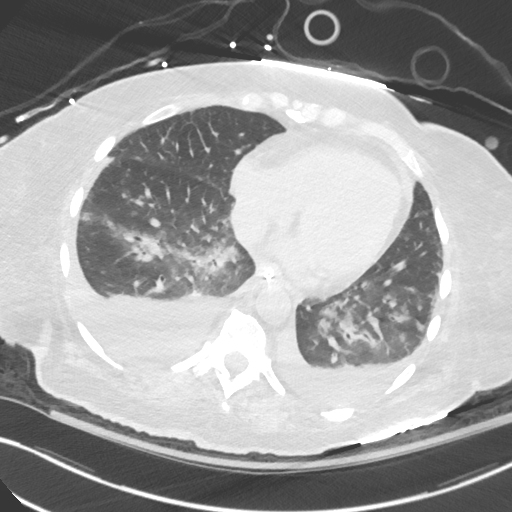
[im 56/151  mediastinal]
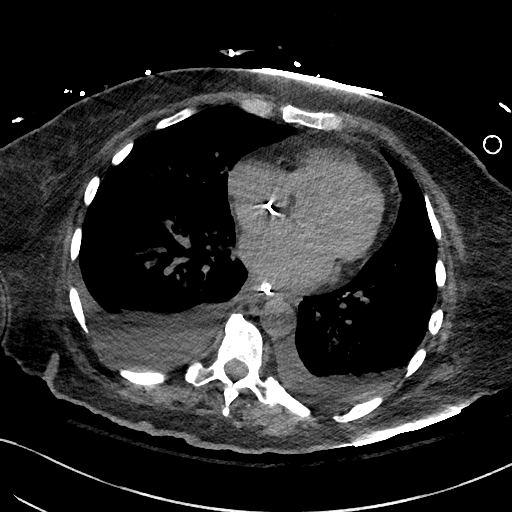
[im 56/151  lung]
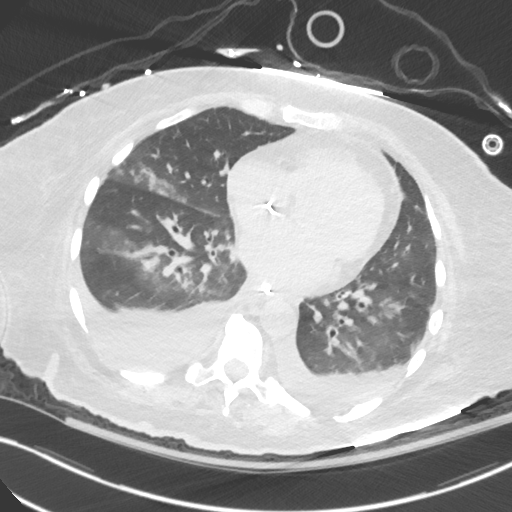
[im 67/151  lung]
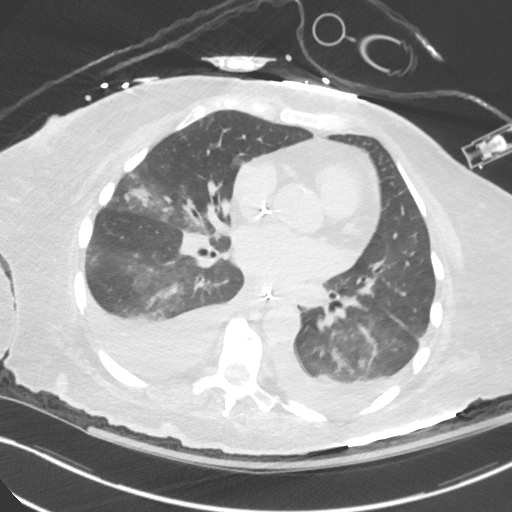
[im 84/151  lung]
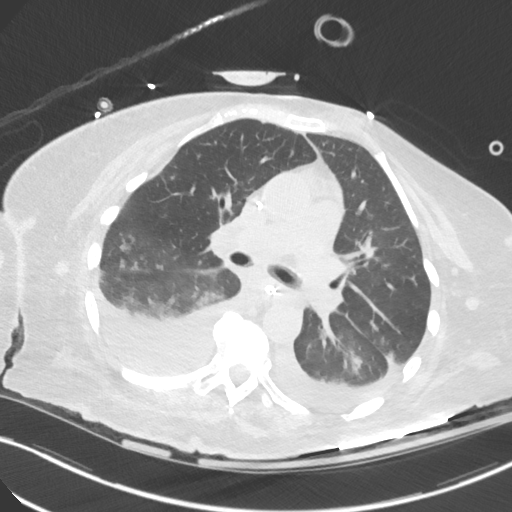
[im 95/151  lung]
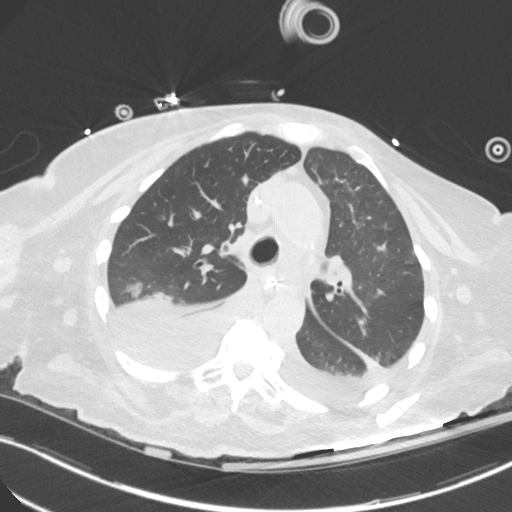
[im 106/151  mediastinal]
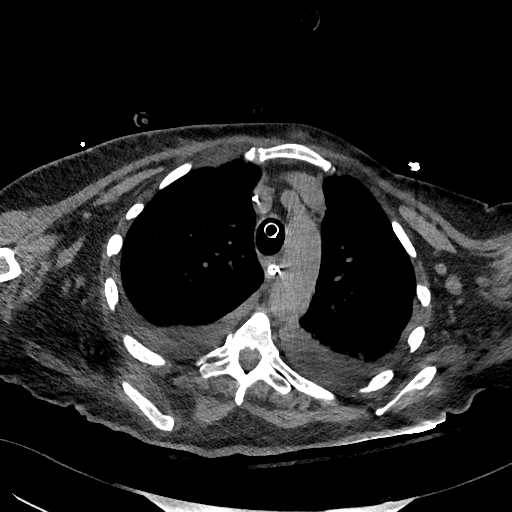
[im 106/151  lung]
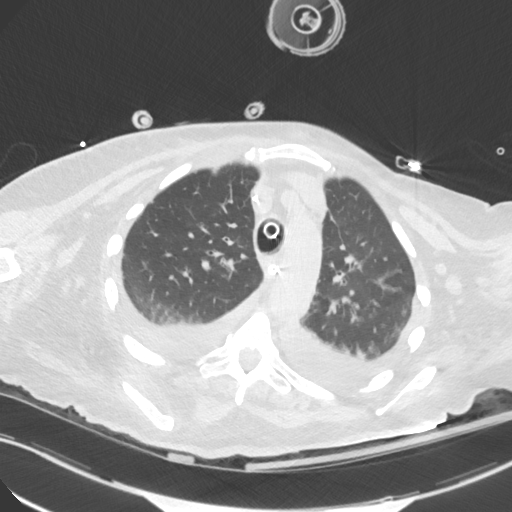
[im 117/151  lung]
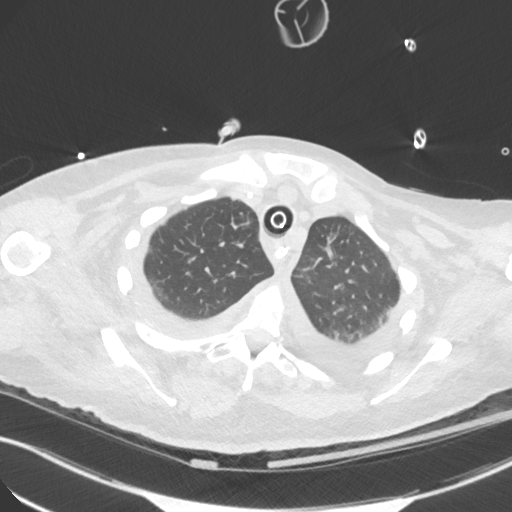
[im 128/151  lung]
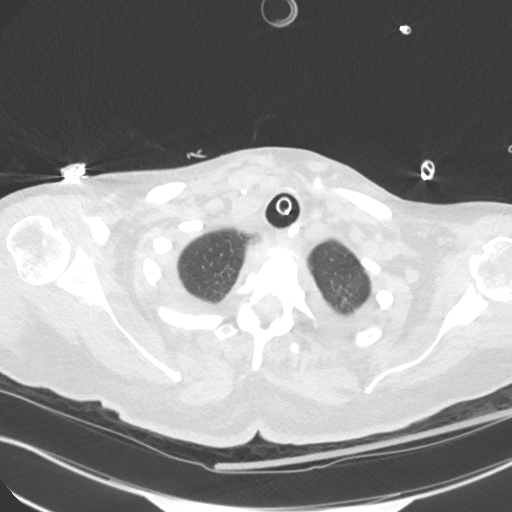
[im 139/151  lung]
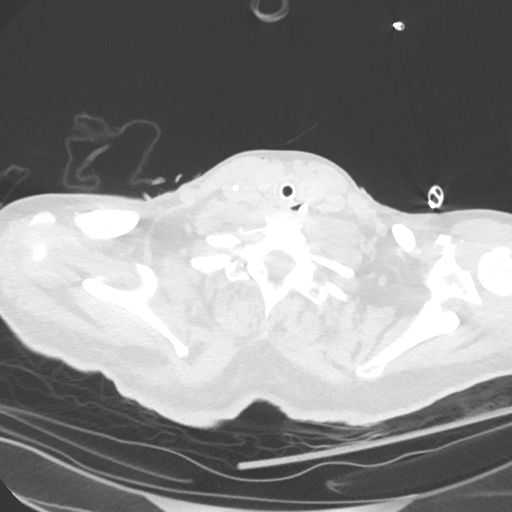

[Series 5: chest w/o 2mm st cor · coronal · non-contrast · 0.63mm/px · 3 of 133 slices shown]
[im 27/133  lung]
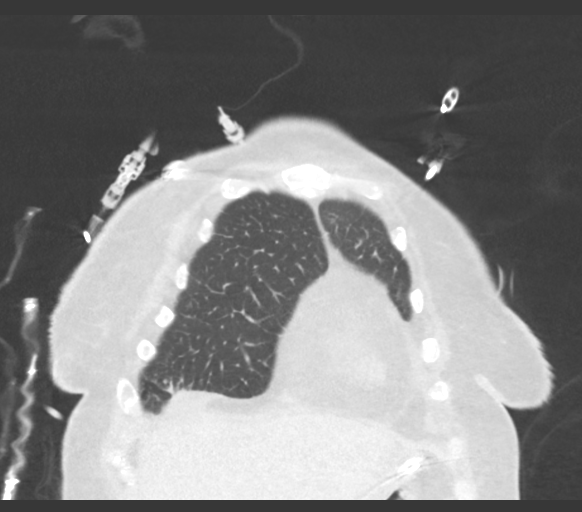
[im 53/133  lung]
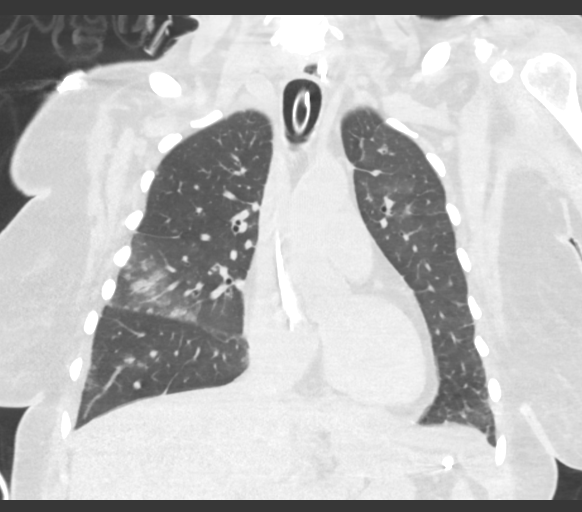
[im 80/133  lung]
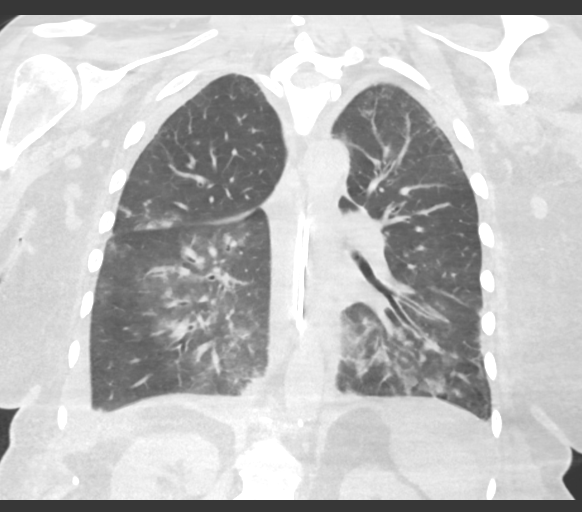

[15 of 36 positions shown; findings below may reference images not displayed]

FINDINGS: Cardiovascular: Right IJ central line placement to the mid right
atrium. Heart size normal. Minimal pericardial fluid.

Mediastinum/Nodes: Endotracheal tube terminates 2.5 cm above carina.
Gastric tube extends at least as far as the stomach. No mediastinal
hematoma, mass, or adenopathy. Prominent bilateral axillary nodes
largest 1.1 cm short axis diameter on the left, stable since
previous.

Lungs/Pleura: Moderate right and smaller left pleural effusions,
increased since previous. No pneumothorax. Scattered somewhat
nodular airspace opacities centrally in both lower lobes and in the
posterior aspect of the right upper lobe, new since previous.

Upper Abdomen: No acute findings. Aortic Atherosclerosis
([74]-170.0).

Musculoskeletal: Cervical fixation hardware partially visualized.
Anterior vertebral endplate spurring at multiple levels in the mid
and lower thoracic spine. Left shoulder DJD with coracoclavicular
mineralization. No acute fracture or worrisome bone lesion.
IMPRESSION: 1. Progressive pleural effusions right greater than left.
2. New patchy bilateral airspace opacities as above.
3. Endotracheal tube and gastric tube placement.

## 2021-08-28 IMAGING — CT CT CERVICAL SPINE W/O CM
4 series · 15 of 33 positions shown, 18 images · non-contrast
Comparison: Cervical spine CT [DATE]. MRI [DATE] and
earlier.

CLINICAL DATA: 75-year-old female with altered mental status,
possible sepsis, status post cardiac arrest. Lower cervical
osteomyelitis since [REDACTED].

EXAM:
CT CERVICAL SPINE WITHOUT CONTRAST
TECHNIQUE: Multidetector CT imaging of the cervical spine was performed without
intravenous contrast. Multiplanar CT image reconstructions were also
generated.

[Series 4: c_spine 2.0 st · axial · 0.33mm/px · z∈[-246,-216]mm · 2 of 88 slices shown]
[im 15/88  bone]
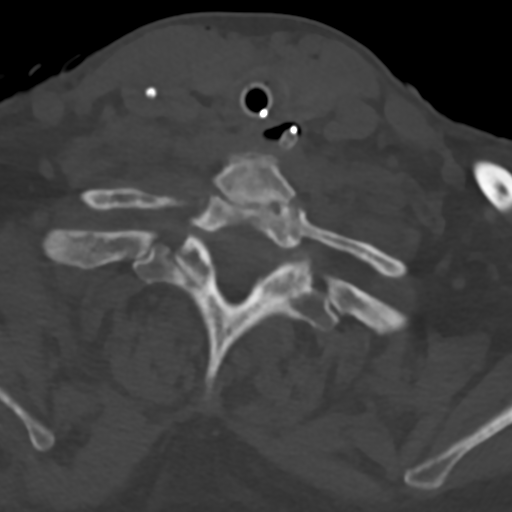
[im 30/88  bone]
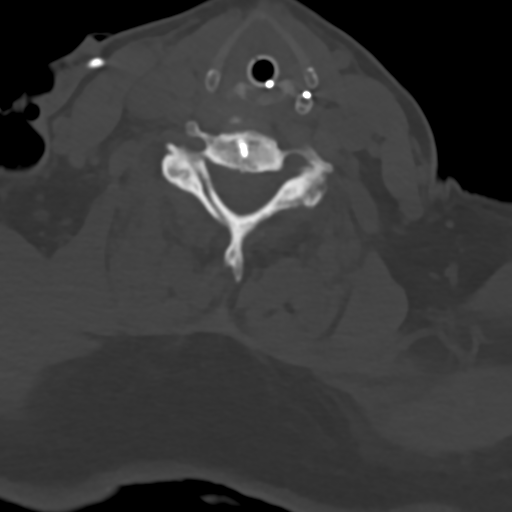

[Series 6: c_spine 2.0 sag bone · sagittal · 0.36mm/px · 5 of 55 slices shown, 6 images]
[im 19/55  bone]
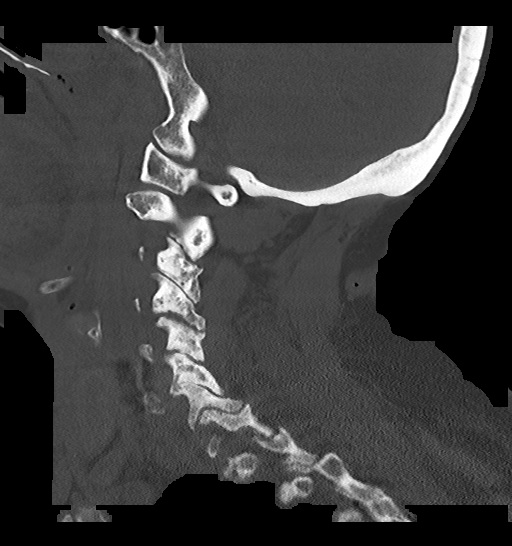
[im 23/55  bone]
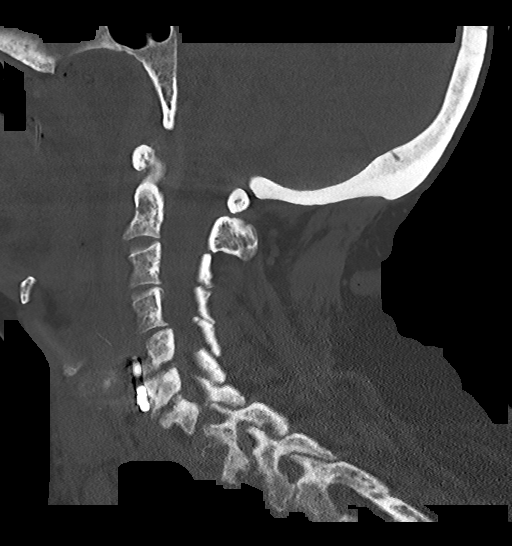
[im 28/55  soft-tissue]
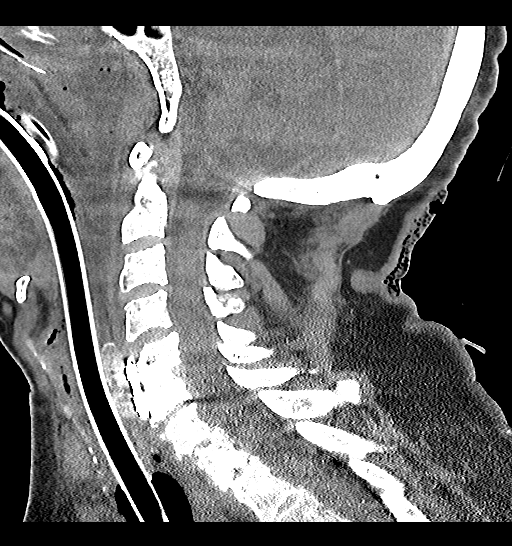
[im 28/55  bone]
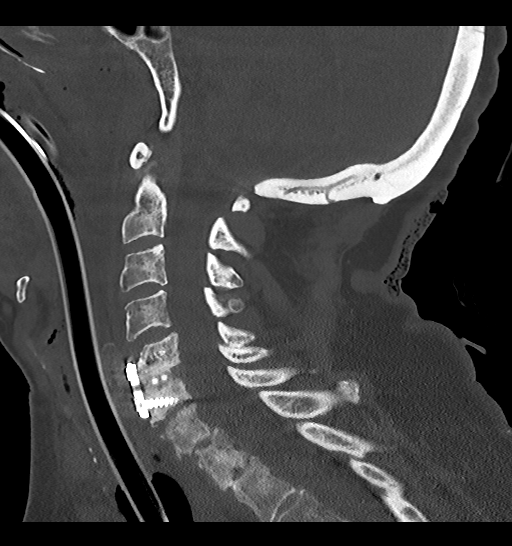
[im 32/55  bone]
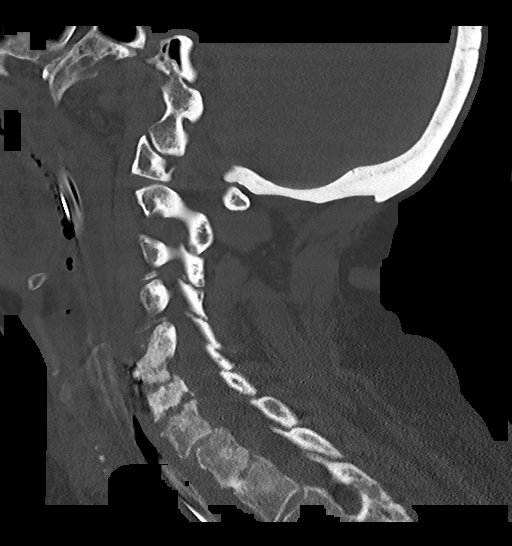
[im 37/55  bone]
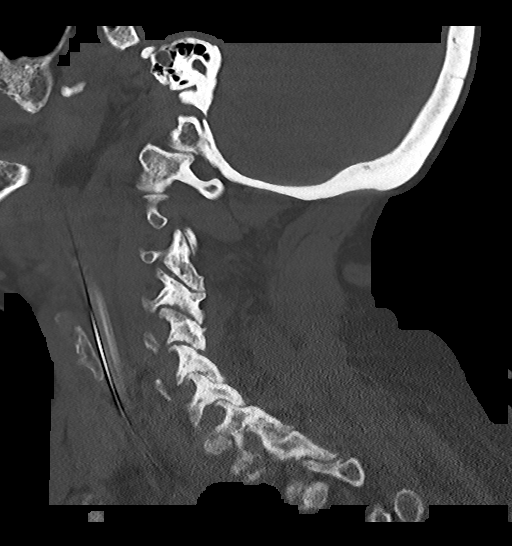

[Series 7: c_spine 2.0 cor bone · coronal · 0.33mm/px · 3 of 67 slices shown]
[im 14/67  bone]
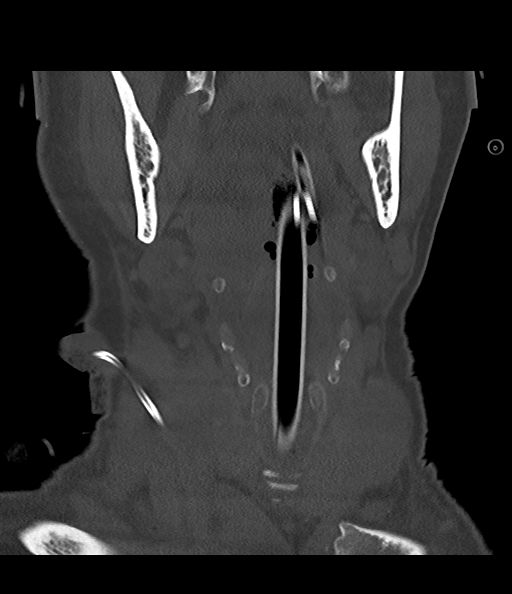
[im 27/67  bone]
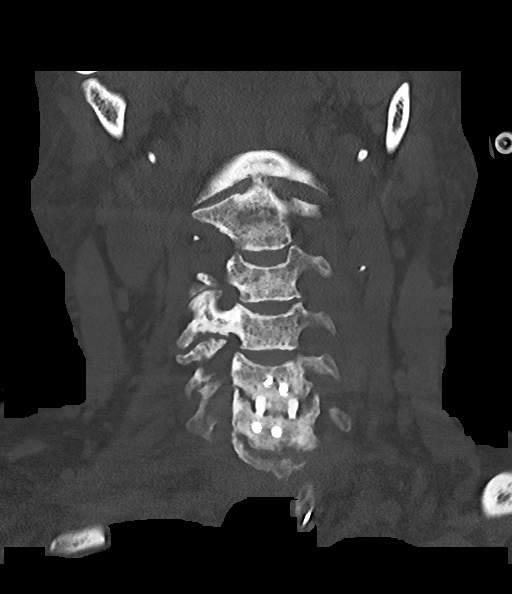
[im 40/67  bone]
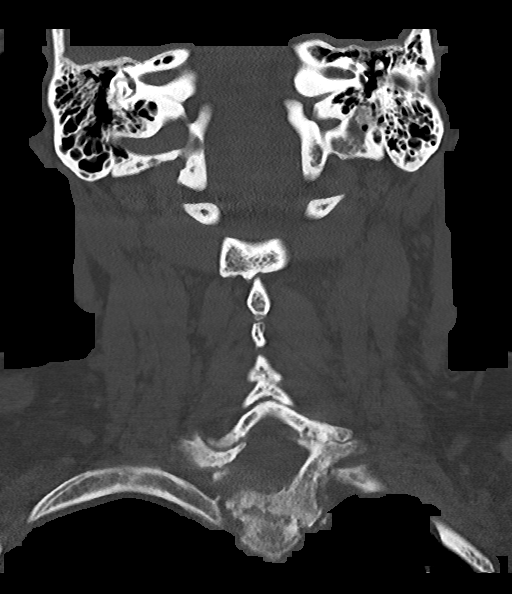

[Series 8: c_spine 2.0 orthogonals · axial · 0.23mm/px · z∈[-263,-143]mm · 5 of 93 slices shown, 7 images]
[im 16/93  soft-tissue]
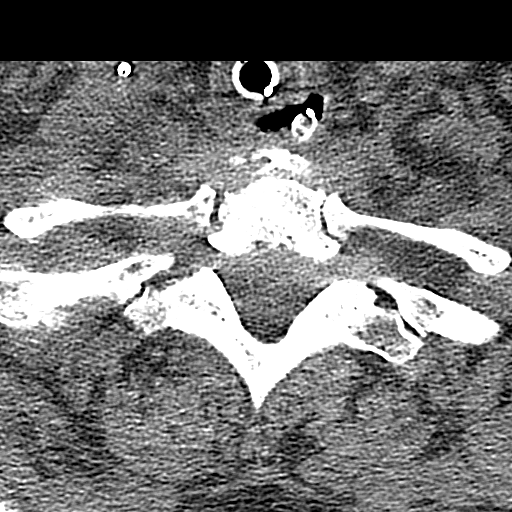
[im 16/93  bone]
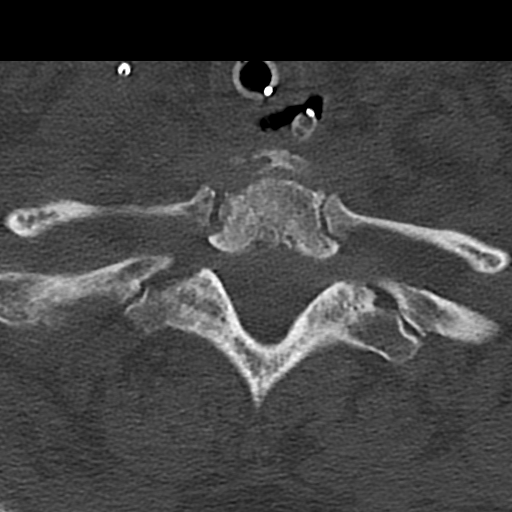
[im 31/93  bone]
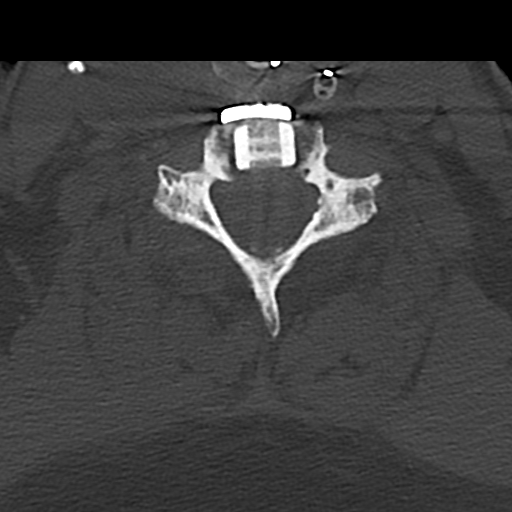
[im 47/93  bone]
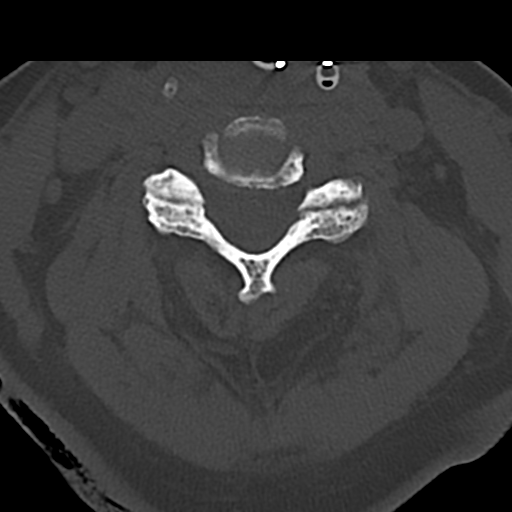
[im 62/93  bone]
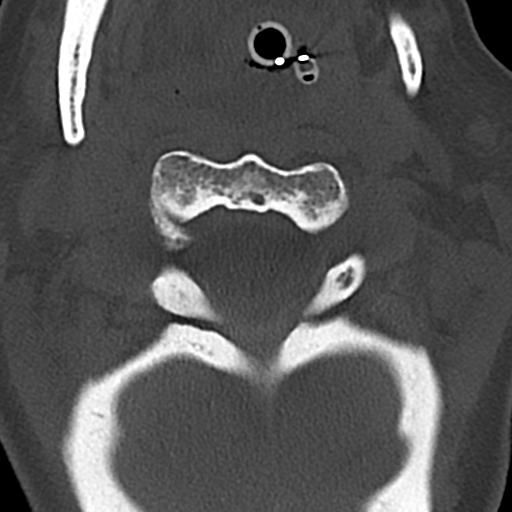
[im 77/93  soft-tissue]
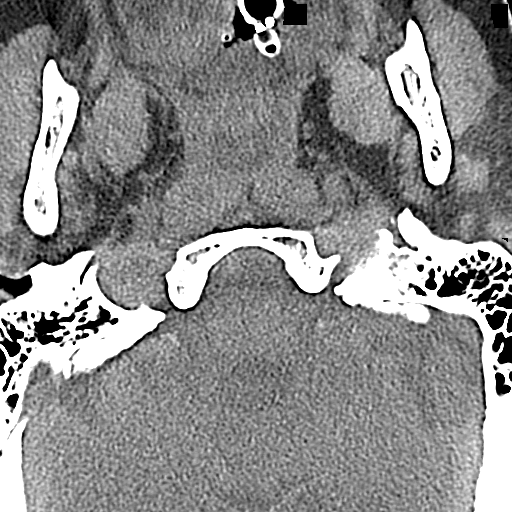
[im 77/93  bone]
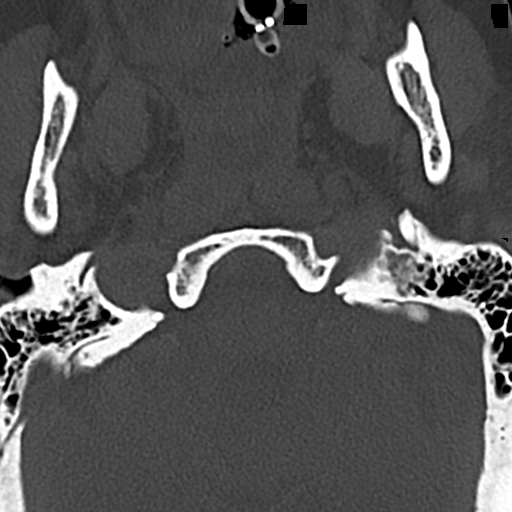

[15 of 33 positions shown; findings below may reference images not displayed]

FINDINGS: Alignment: Improved cervical lordosis since [REDACTED]. Anterolisthesis
of C7 on T1 is stable since [REDACTED]. Bilateral posterior element
alignment is within normal limits.

Skull base and vertebrae: Visualized skull base is intact. No
atlanto-occipital dissociation. Congenital incomplete ossification
of the posterior C1 ring. C1 and C2 appear intact and aligned.

Bone mineralization in the cervical spine becomes abnormal at C5-C6,
although sclerosis there might be postoperative (details below). But
there is osteolysis of the C6 inferior endplate, also the C7 and T1
vertebral bodies since [REDACTED] in keeping with osteomyelitis. No C7
or T1 collapse at this time. No acute cervical spine fracture
identified.

Soft tissues and spinal canal: Intubated and oral enteric tube with
fluid in the pharynx. Right IJ central line. Negative CT appearance
of the cervical spinal canal.

Disc levels: C5-C6 ACDF with no definite hardware loosening.
Evidence of developing interbody arthrodesis (sagittal image 29).
Widespread cervical facet arthropathy.

Upper chest: In addition to the abnormal T1 vertebral body, the
superior half of T2 also appears irregular and partially eroded now.
Partially visible layering pleural effusions in the lung apices. ET
tube continues into the trachea. Enteric tube continues into the
proximal esophagus.
IMPRESSION: 1. Sequelae of C6 through T2 Discitis Osteomyelitis with vertebral
endplate erosions since [REDACTED]. No vertebral body collapse at this
time.

2. No superimposed acute cervical spine fracture identified. C5-C6
ACDF with intact hardware and evidence of developing interbody
arthrodesis.

3. Intubated and oral enteric tube in place. Layering pleural
effusions in the lung apices.

## 2021-08-28 IMAGING — CT CT HEAD W/O CM
4 series · 16 of 47 positions shown, 18 images · non-contrast
Comparison: Brain MRI [DATE].  Head CT [DATE] and earlier.

CLINICAL DATA: 75-year-old female with altered mental status,
possible sepsis, status post cardiac arrest. Pontine infarct on MRI
last month.

EXAM:
CT HEAD WITHOUT CONTRAST
TECHNIQUE: Contiguous axial images were obtained from the base of the skull
through the vertex without intravenous contrast.

[Series 3: head without · axial · non-contrast · 0.42mm/px · z∈[-150,-10]mm · 7 of 38 slices shown, 9 images]
[im 5/38  brain]
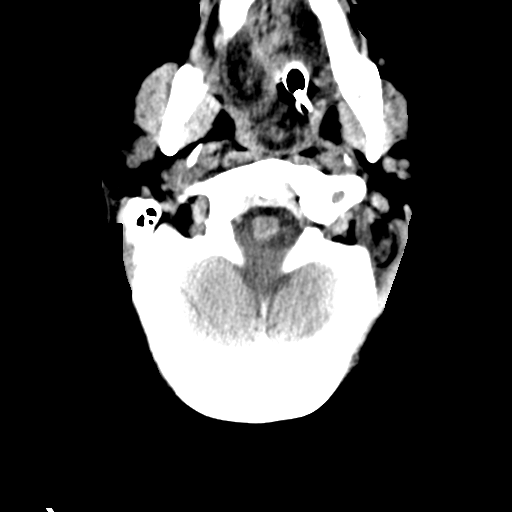
[im 5/38  bone]
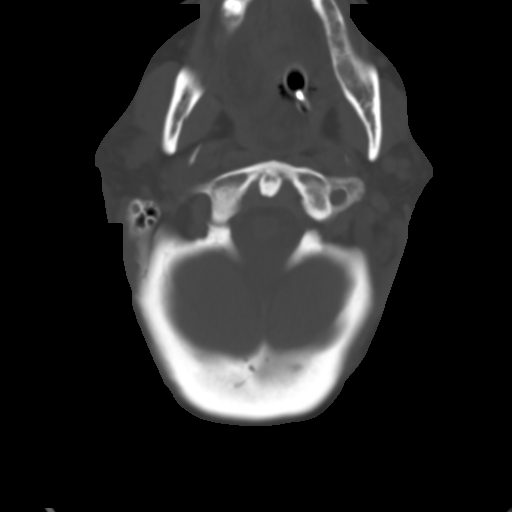
[im 10/38  brain]
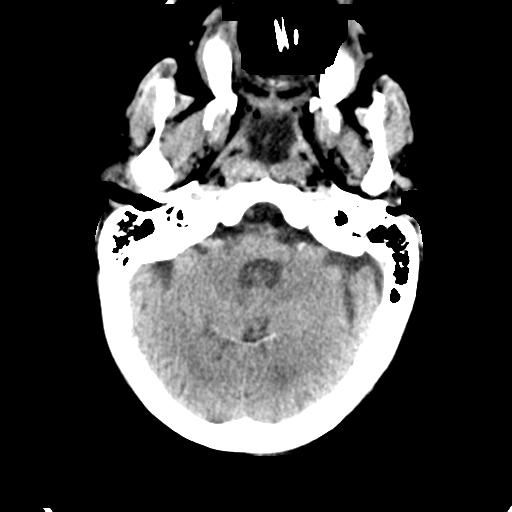
[im 14/38  brain]
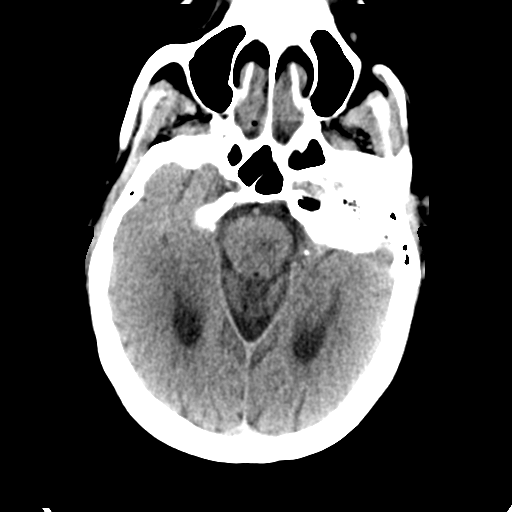
[im 19/38  brain]
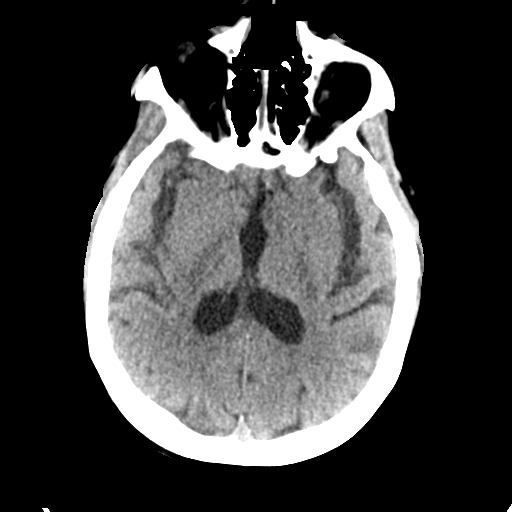
[im 24/38  brain]
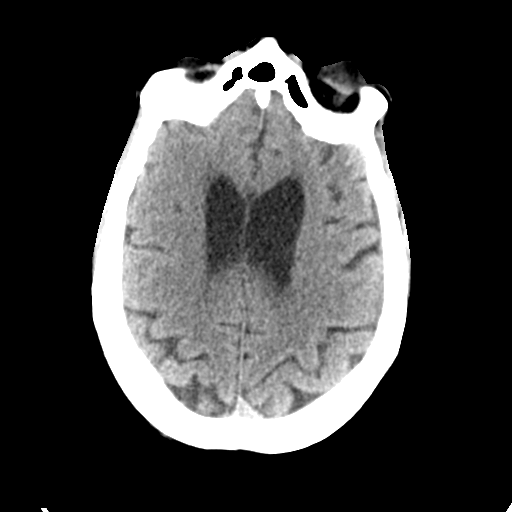
[im 24/38  bone]
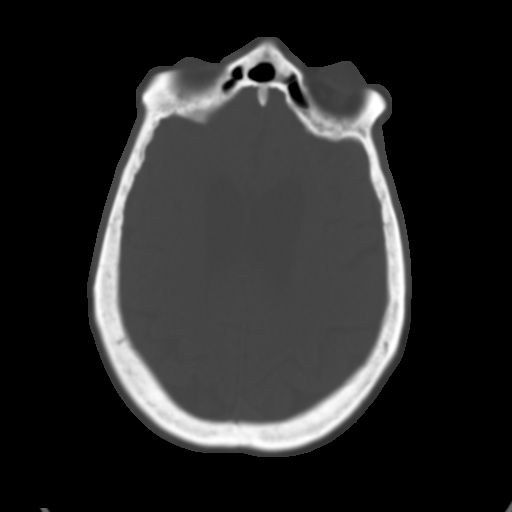
[im 28/38  brain]
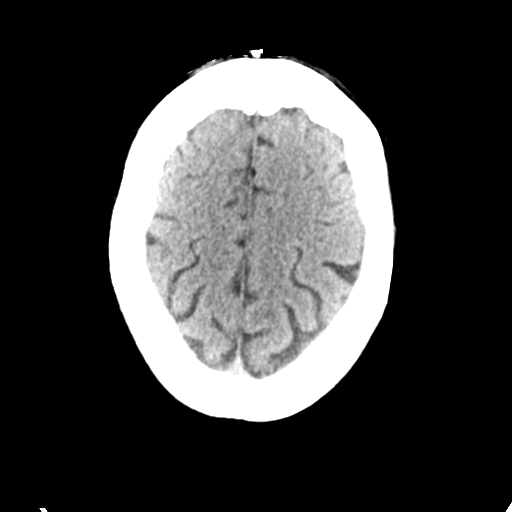
[im 33/38  brain]
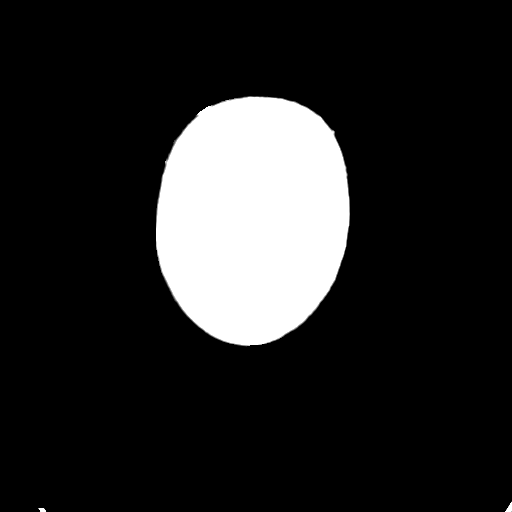

[Series 4: head bone · axial · 0.42mm/px · z∈[-152,-116]mm · 3 of 93 slices shown]
[im 10/93  bone]
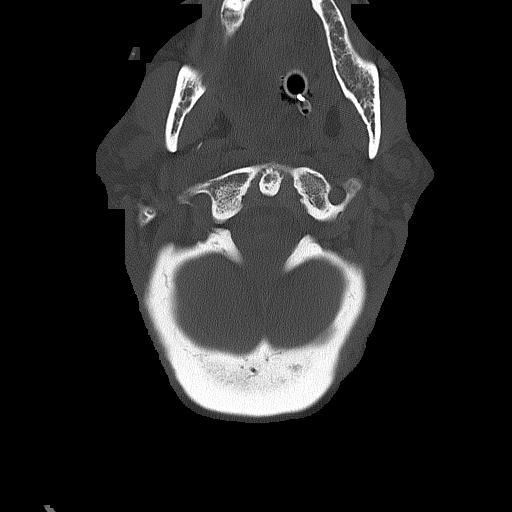
[im 19/93  bone]
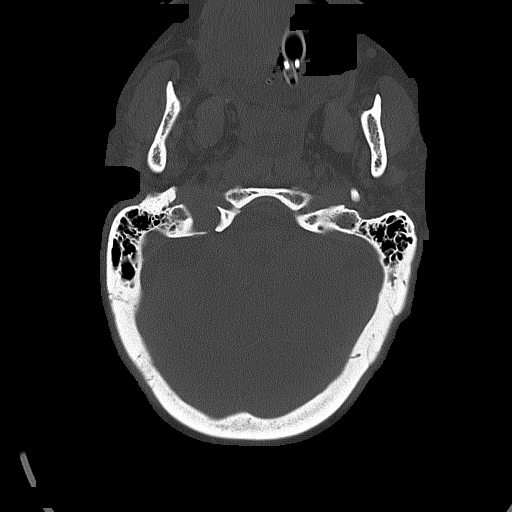
[im 28/93  bone]
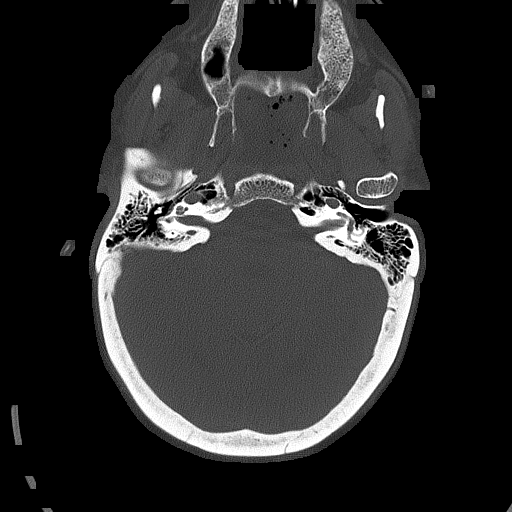

[Series 5: head without cor · coronal · non-contrast · 0.39mm/px · 3 of 67 slices shown]
[im 23/67  brain]
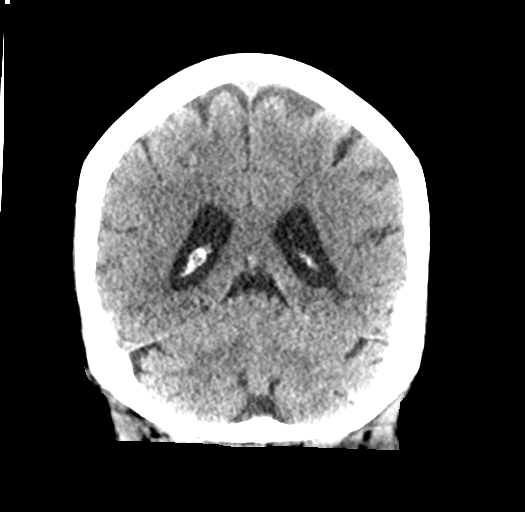
[im 30/67  brain]
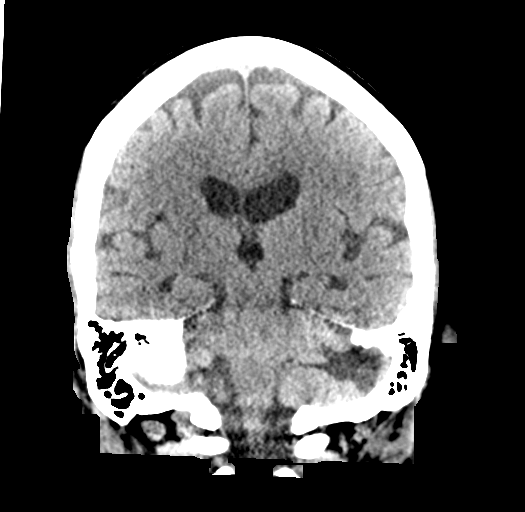
[im 37/67  brain]
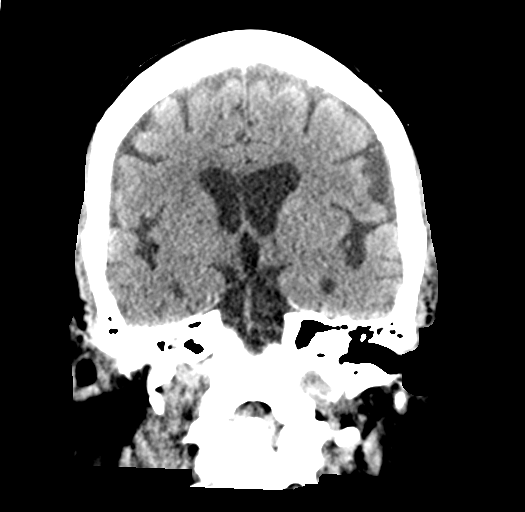

[Series 6: head without sag · sagittal · non-contrast · 0.38mm/px · 3 of 51 slices shown]
[im 17/51  brain]
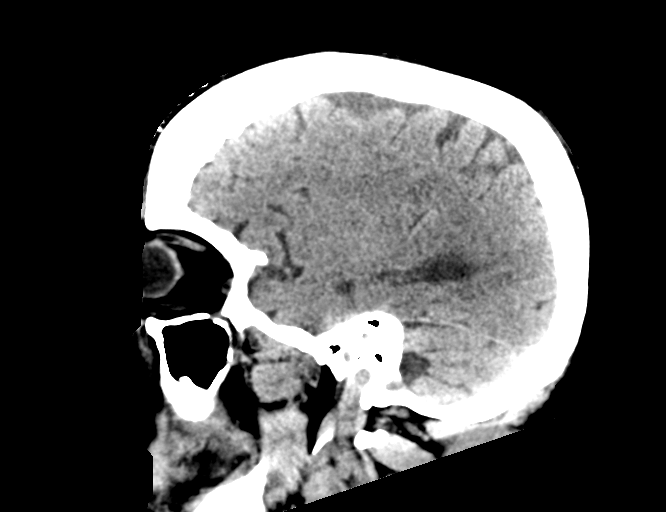
[im 26/51  brain]
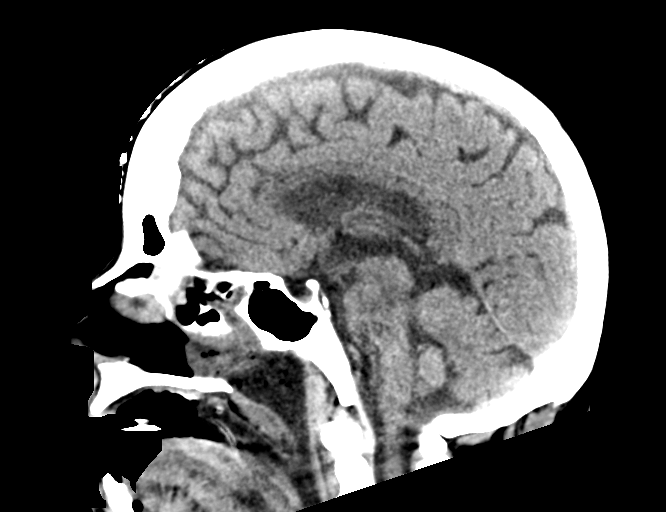
[im 34/51  brain]
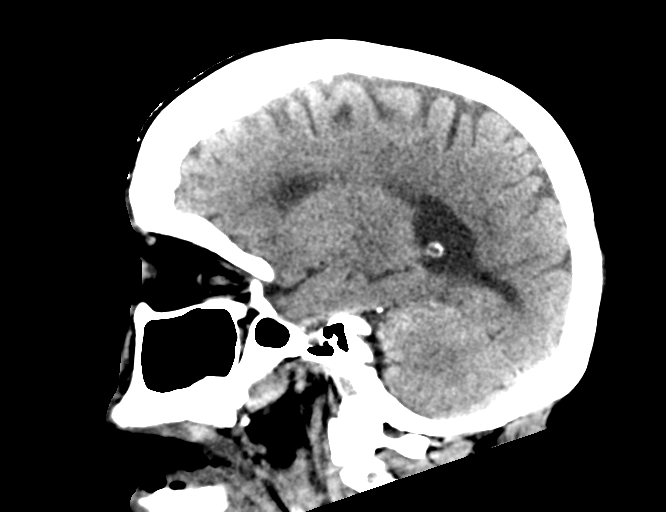

[16 of 47 positions shown; findings below may reference images not displayed]

FINDINGS: Brain: Hypodense left central pontine infarct is stable from last
month. No associated hemorrhage or mass effect.

Questionable subtle decreased gray-white matter differentiation in
the cerebral hemispheres now (series 3, image 24), but no convincing
loss of cerebral sulci. Stable ventricle size. No intracranial mass
effect.

No midline shift, ventriculomegaly, mass effect, evidence of mass
lesion, intracranial hemorrhage or evidence of cortically based
acute infarction.

Vascular: Mild Calcified atherosclerosis at the skull base. No
suspicious intracranial vascular hyperdensity.

Skull: Stable.  No acute osseous abnormality identified.

Sinuses/Orbits: Visualized paranasal sinuses and mastoids are stable
and well aerated.

Other: Intubated and oral enteric tube in place with fluid in the
pharynx. Disconjugate gaze. No acute scalp soft tissue finding.
IMPRESSION: Difficult to exclude early loss of cerebral hemisphere gray-white
differentiation, such as from anoxic injury in the setting of
cardiac arrest.
But otherwise stable non contrast CT appearance of the brain from
last month.
If the clinical course is equivocal then a repeat Head CT in 24
hours or follow-up Brain MRI would should be confirmatory.

## 2021-08-28 IMAGING — DX DG CHEST 1V PORT
1 series · 1 of 1 positions shown · non-contrast
Comparison: Earlier same day

CLINICAL DATA: Intubation and central line placement

EXAM:
PORTABLE CHEST 1 VIEW

[chest]
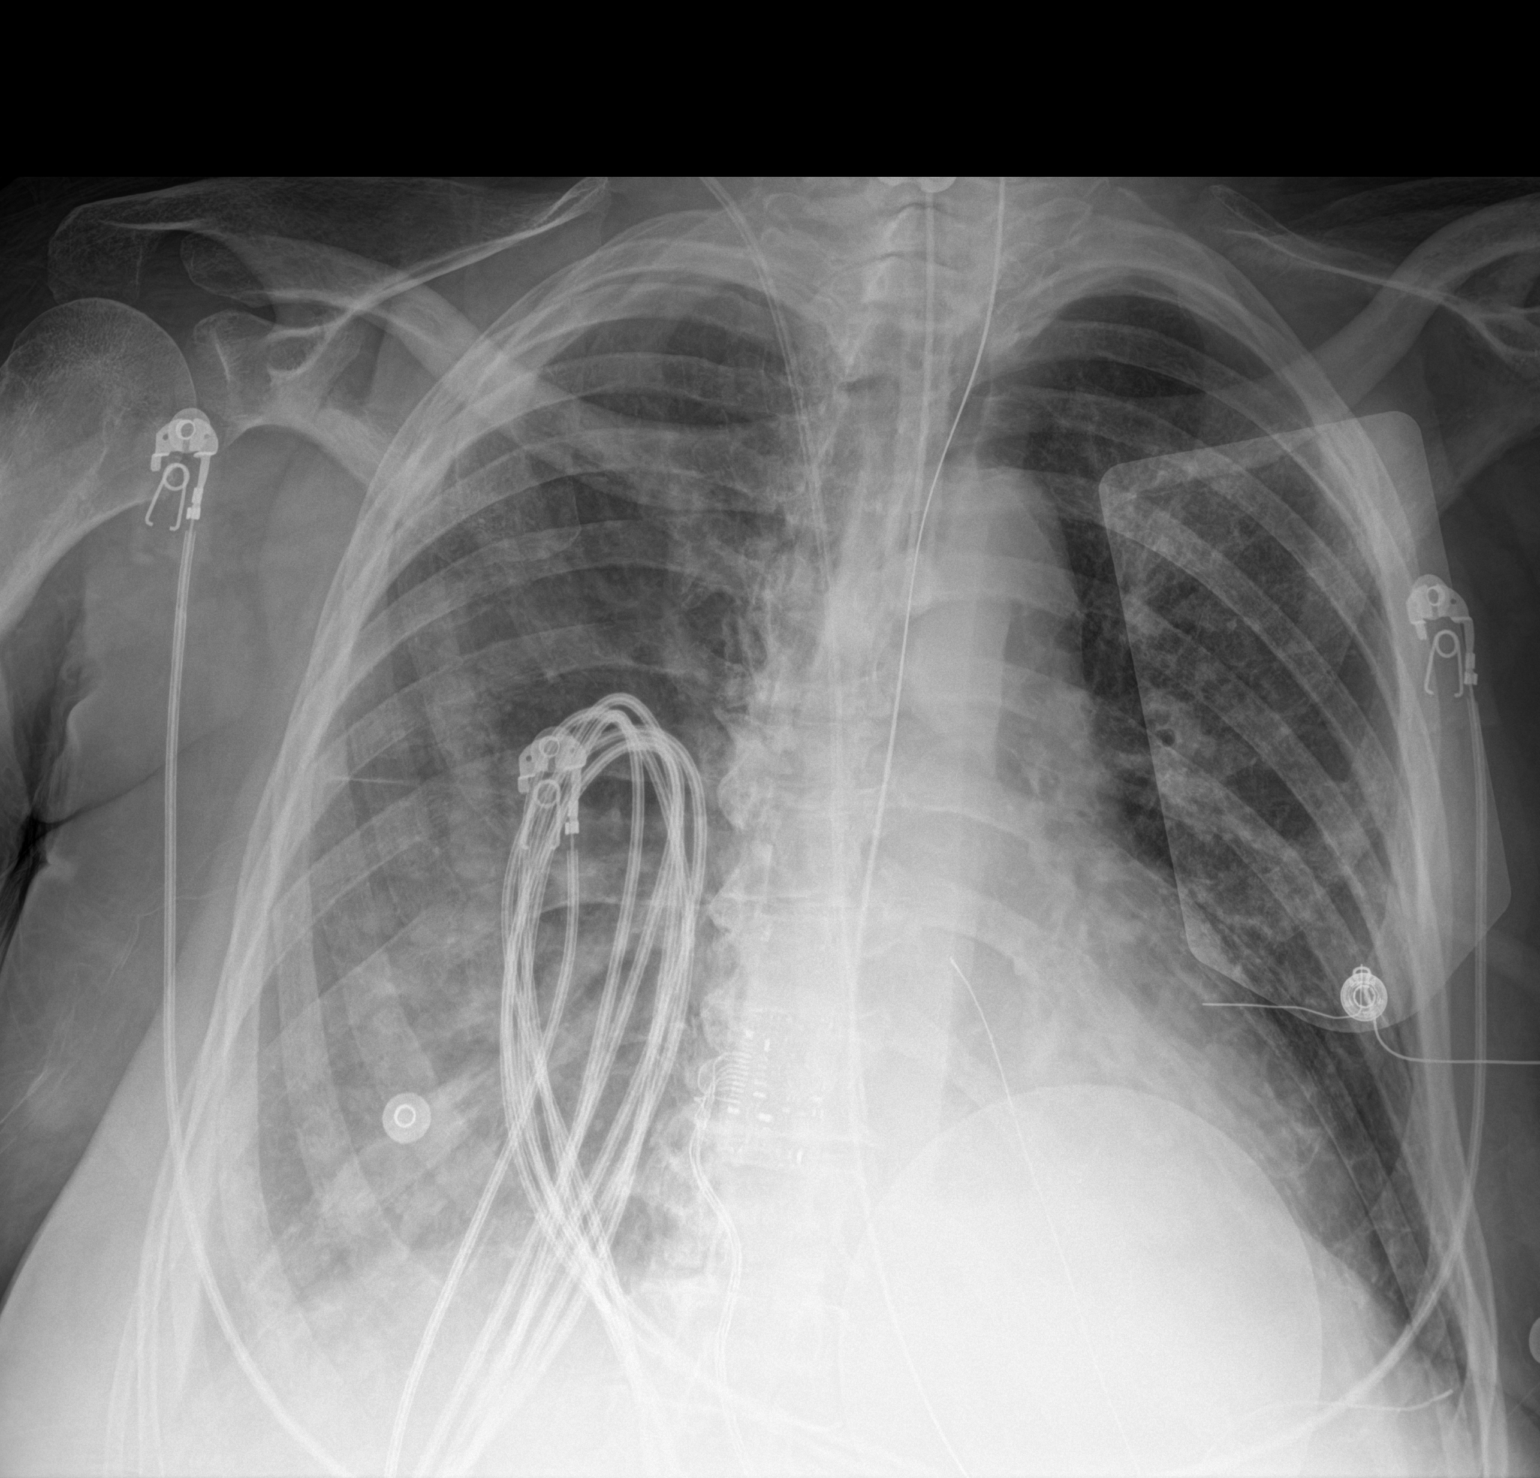

[1 of 1 positions shown; findings below may reference images not displayed]

FINDINGS: Patient is rotated.

New endotracheal tube is present and the proximally 2.5 cm above the
carina. Right IJ central line overlies right atrium. Enteric tube
passes below the diaphragm with tip out of field of view.

No pneumothorax. Significantly improved aeration of the left lung.
Persistent right pleural effusion and adjacent atelectasis with
improved aeration at the right lung base. Stable cardiomediastinal
contours.
IMPRESSION: Lines and tubes as above.  No pneumothorax.

Significantly improved aeration of the left lung. Persistent right
pleural effusion and adjacent atelectasis with improved aeration at
the right lung base.

## 2021-08-28 MED ORDER — HEPARIN SODIUM (PORCINE) 5000 UNIT/ML IJ SOLN
5000.0000 [IU] | Freq: Three times a day (TID) | INTRAMUSCULAR | Status: DC
  Administered 2021-08-28 – 2021-08-30 (×5): 5000 [IU] via SUBCUTANEOUS
  Filled 2021-08-28 (×5): qty 1

## 2021-08-28 MED ORDER — CHLORHEXIDINE GLUCONATE 0.12% ORAL RINSE (MEDLINE KIT)
15.0000 mL | Freq: Two times a day (BID) | OROMUCOSAL | Status: DC
  Administered 2021-08-28 – 2021-08-31 (×6): 15 mL via OROMUCOSAL

## 2021-08-28 MED ORDER — SODIUM CHLORIDE 0.9 % IV SOLN
2.0000 g | Freq: Once | INTRAVENOUS | Status: AC
  Administered 2021-08-28: 2 g via INTRAVENOUS
  Filled 2021-08-28: qty 2

## 2021-08-28 MED ORDER — VANCOMYCIN HCL 1750 MG/350ML IV SOLN
1750.0000 mg | Freq: Once | INTRAVENOUS | Status: AC
  Administered 2021-08-28: 1750 mg via INTRAVENOUS
  Filled 2021-08-28: qty 350

## 2021-08-28 MED ORDER — NOREPINEPHRINE 4 MG/250ML-% IV SOLN
0.0000 ug/min | INTRAVENOUS | Status: DC
  Administered 2021-08-28: 20 ug/min via INTRAVENOUS
  Administered 2021-08-28: 75 ug/min via INTRAVENOUS
  Filled 2021-08-28: qty 250

## 2021-08-28 MED ORDER — MIDAZOLAM HCL 2 MG/2ML IJ SOLN
INTRAMUSCULAR | Status: AC
  Filled 2021-08-28: qty 2

## 2021-08-28 MED ORDER — DEXTROSE 50 % IV SOLN: 25.0000 g | INTRAVENOUS | Status: AC

## 2021-08-28 MED ORDER — AMIODARONE HCL IN DEXTROSE 360-4.14 MG/200ML-% IV SOLN
INTRAVENOUS | Status: AC
  Filled 2021-08-28: qty 200

## 2021-08-28 MED ORDER — ORAL CARE MOUTH RINSE
15.0000 mL | OROMUCOSAL | Status: DC
  Administered 2021-08-28 – 2021-08-31 (×24): 15 mL via OROMUCOSAL

## 2021-08-28 MED ORDER — FENTANYL 2500MCG IN NS 250ML (10MCG/ML) PREMIX INFUSION
25.0000 ug/h | INTRAVENOUS | Status: DC
  Administered 2021-08-28 – 2021-08-30 (×2): 50 ug/h via INTRAVENOUS
  Filled 2021-08-28 (×2): qty 250

## 2021-08-28 MED ORDER — DOCUSATE SODIUM 100 MG PO CAPS: 100.0000 mg | ORAL_CAPSULE | Freq: Two times a day (BID) | ORAL | Status: DC | PRN

## 2021-08-28 MED ORDER — CHLORHEXIDINE GLUCONATE CLOTH 2 % EX PADS
6.0000 | MEDICATED_PAD | Freq: Every day | CUTANEOUS | Status: DC
  Administered 2021-08-28 – 2021-08-30 (×3): 6 via TOPICAL

## 2021-08-28 MED ORDER — SODIUM CHLORIDE 0.9 % IV SOLN
2.0000 g | Freq: Two times a day (BID) | INTRAVENOUS | Status: DC
  Administered 2021-08-28: 2 g via INTRAVENOUS
  Filled 2021-08-28: qty 2

## 2021-08-28 MED ORDER — WHITE PETROLATUM EX OINT
TOPICAL_OINTMENT | CUTANEOUS | Status: AC
  Filled 2021-08-28: qty 28.35

## 2021-08-28 MED ORDER — SUCCINYLCHOLINE CHLORIDE 200 MG/10ML IV SOSY
PREFILLED_SYRINGE | INTRAVENOUS | Status: AC
  Filled 2021-08-28: qty 10

## 2021-08-28 MED ORDER — LACTATED RINGERS IV BOLUS: 1000.0000 mL | Freq: Once | INTRAVENOUS | Status: DC

## 2021-08-28 MED ORDER — FENTANYL CITRATE PF 50 MCG/ML IJ SOSY
PREFILLED_SYRINGE | INTRAMUSCULAR | Status: AC
  Filled 2021-08-28: qty 2

## 2021-08-28 MED ORDER — DEXTROSE 50 % IV SOLN
25.0000 g | INTRAVENOUS | Status: AC
  Administered 2021-08-28: 25 g via INTRAVENOUS
  Filled 2021-08-28: qty 50

## 2021-08-28 MED ORDER — VANCOMYCIN HCL 1750 MG/350ML IV SOLN
1750.0000 mg | Freq: Once | INTRAVENOUS | Status: DC
  Filled 2021-08-28: qty 350

## 2021-08-28 MED ORDER — EPINEPHRINE 1 MG/10ML IJ SOSY
PREFILLED_SYRINGE | INTRAMUSCULAR | Status: AC | PRN
  Administered 2021-08-28: .1 mg via INTRAVENOUS

## 2021-08-28 MED ORDER — NOREPINEPHRINE 16 MG/250ML-% IV SOLN
0.0000 ug/min | INTRAVENOUS | Status: DC
  Administered 2021-08-28: 10 ug/min via INTRAVENOUS
  Administered 2021-08-29: 7 ug/min via INTRAVENOUS
  Administered 2021-09-01: 3 ug/min via INTRAVENOUS
  Filled 2021-08-28 (×2): qty 250

## 2021-08-28 MED ORDER — KETAMINE HCL 50 MG/5ML IJ SOSY
PREFILLED_SYRINGE | INTRAMUSCULAR | Status: AC
  Filled 2021-08-28: qty 5

## 2021-08-28 MED ORDER — ROCURONIUM BROMIDE 10 MG/ML (PF) SYRINGE
PREFILLED_SYRINGE | INTRAVENOUS | Status: AC
  Administered 2021-08-28: 100 mg
  Filled 2021-08-28: qty 10

## 2021-08-28 MED ORDER — SODIUM CHLORIDE 0.9 % IV SOLN
3.0000 g | Freq: Four times a day (QID) | INTRAVENOUS | Status: DC
  Administered 2021-08-28 – 2021-09-12 (×59): 3 g via INTRAVENOUS
  Filled 2021-08-28 (×61): qty 8

## 2021-08-28 MED ORDER — POLYETHYLENE GLYCOL 3350 17 G PO PACK: 17.0000 g | PACK | Freq: Every day | ORAL | Status: DC | PRN

## 2021-08-28 MED ORDER — VANCOMYCIN HCL 1250 MG/250ML IV SOLN
1250.0000 mg | INTRAVENOUS | Status: DC
  Filled 2021-08-28: qty 250

## 2021-08-28 MED ORDER — DEXTROSE 50 % IV SOLN
INTRAVENOUS | Status: AC
  Administered 2021-08-28: 25 g via INTRAVENOUS
  Filled 2021-08-28: qty 50

## 2021-08-28 MED ORDER — "THROMBI-PAD 3""X3"" EX PADS"
1.0000 | MEDICATED_PAD | Freq: Once | CUTANEOUS | Status: AC
  Administered 2021-08-28: 1 via TOPICAL
  Filled 2021-08-28: qty 1

## 2021-08-28 MED ORDER — LACTATED RINGERS IV BOLUS
1000.0000 mL | Freq: Once | INTRAVENOUS | Status: AC
  Administered 2021-08-28: 1000 mL via INTRAVENOUS

## 2021-08-28 MED ORDER — FENTANYL BOLUS VIA INFUSION
25.0000 ug | INTRAVENOUS | Status: DC | PRN
  Administered 2021-08-30: 50 ug via INTRAVENOUS
  Administered 2021-08-30: 100 ug via INTRAVENOUS
  Administered 2021-08-30: 50 ug via INTRAVENOUS
  Administered 2021-08-30: 25 ug via INTRAVENOUS
  Administered 2021-08-31: 50 ug via INTRAVENOUS
  Filled 2021-08-28: qty 100

## 2021-08-28 MED ORDER — ACETAMINOPHEN 650 MG RE SUPP: 650.0000 mg | RECTAL | Status: DC

## 2021-08-28 MED ORDER — AMIODARONE IV BOLUS ONLY 150 MG/100ML
150.0000 mg | Freq: Once | INTRAVENOUS | Status: AC
  Administered 2021-08-28: 150 mg via INTRAVENOUS

## 2021-08-28 MED ORDER — PANTOPRAZOLE SODIUM 40 MG IV SOLR
40.0000 mg | Freq: Every day | INTRAVENOUS | Status: DC
  Administered 2021-08-28 – 2021-09-02 (×6): 40 mg via INTRAVENOUS
  Filled 2021-08-28 (×6): qty 40

## 2021-08-28 MED ORDER — PROPOFOL 1000 MG/100ML IV EMUL: 0.0000 ug/kg/min | INTRAVENOUS | Status: DC

## 2021-08-28 MED ORDER — ACETAMINOPHEN 325 MG PO TABS: 650.0000 mg | ORAL_TABLET | ORAL | Status: DC

## 2021-08-28 MED ORDER — ETOMIDATE 2 MG/ML IV SOLN
INTRAVENOUS | Status: AC
  Administered 2021-08-28: 10 mg
  Filled 2021-08-28: qty 20

## 2021-08-28 MED ORDER — FENTANYL CITRATE PF 50 MCG/ML IJ SOSY
25.0000 ug | PREFILLED_SYRINGE | Freq: Once | INTRAMUSCULAR | Status: DC
Start: 2021-08-28 — End: 2021-08-31

## 2021-08-28 MED ORDER — ACETAMINOPHEN 160 MG/5ML PO SOLN: 650.0000 mg | ORAL | Status: DC

## 2021-08-28 NOTE — Sepsis Progress Note (Signed)
Elink following Code Sepsis. 

## 2021-08-28 NOTE — ED Provider Notes (Signed)
Briefly this is a 75 yo female presenting from nursing facility with concern for hypoxia, confusion, worsening over the past several days.  Patient intubated by Dr Pilar Plate EDP on arrival after being found to have significant hypoxia and obtunded mental status.  Pending labs, CT imaging.  Xray chest initially showing left pleural effusion vs infection.  Dr Pilar Plate confirmed with patient's family code status as FULL CODE.   Patient recently hospitalized for cervical epidural abscess.  She is functionally quadriplegic.  She had staph aureus (+) blood culture on prior hospitliazation 1 month ago, and was treated with antibiotics (now completed via PICC on 08/25/21 with cefazolin per ID recommendations).  She also has a PEG tube for dysphagia and a foley catheter.  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  8 am - central line placed in right IJ, right arterial line placed, pt on levophed being weaned for BP support.  Patient had brief run asystole around 0743 requiring 1 round epi and 2 minutes CPR, with ROSC.    Patient has now received amiodarone bolus and is receiving IV fluids, IV antibiotics.  Admitted by ICU service.  Family still en route to hospital.  .Critical Care Performed by: Terald Sleeper, MD Authorized by: Terald Sleeper, MD   Critical care provider statement:    Critical care time (minutes):  45   Critical care time was exclusive of:  Separately billable procedures and treating other patients   Critical care was time spent personally by me on the following activities:  Ordering and performing treatments and interventions, ordering and review of laboratory studies, ordering and review of radiographic studies, pulse oximetry, review of old charts, examination of patient and evaluation of patient's response to treatment .Central  Line  Date/Time: 08/28/2021 8:04 AM Performed by: Terald Sleeper, MD Authorized by: Terald Sleeper, MD   Consent:    Consent obtained:  Emergent situation Universal protocol:    Imaging studies available: yes     Required blood products, implants, devices, and special equipment available: yes     Site/side marked: yes     Immediately prior to procedure, a time out was called: yes     Patient identity confirmed:  Arm band Pre-procedure details:    Indication(s): central venous access, hemodynamic monitoring and insufficient peripheral access     Hand hygiene: Hand hygiene performed prior to insertion     Sterile barrier technique: All elements of maximal sterile technique followed     Skin preparation:  Chlorhexidine   Skin preparation agent: Skin preparation agent completely dried prior to procedure   Sedation:    Sedation type:  None Anesthesia:    Anesthesia method:  None Procedure details:    Location:  R internal jugular   Patient position:  Supine   Procedural supplies:  Triple lumen   Catheter size:  7 Fr   Landmarks identified: yes     Ultrasound guidance: yes     Ultrasound guidance timing: real time     Sterile ultrasound techniques: Sterile gel and sterile probe covers were used     Number of attempts:  1   Successful placement: yes   Post-procedure details:    Post-procedure:  Line sutured and dressing applied   Assessment:  Blood return through all ports, free fluid flow, no pneumothorax on x-ray and placement  verified by x-ray   Procedure completion:  Tolerated well, no immediate complications ARTERIAL LINE  Date/Time: 08/28/2021 8:05 AM Performed by: Terald Sleeper, MD Authorized by: Terald Sleeper, MD   Consent:    Consent obtained:  Emergent situation Universal protocol:    Site/side marked: yes     Immediately prior to procedure, a time out was called: yes     Patient identity confirmed:  Arm band Indications:    Indications:  hemodynamic monitoring and multiple ABGs   Pre-procedure details:    Skin preparation:  Chlorhexidine   Preparation: Patient was prepped and draped in sterile fashion   Sedation:    Sedation type:  None Anesthesia:    Anesthesia method:  None Procedure details:    Location:  R radial   Allen's test performed: yes     Allen's test abnormal: no     Needle gauge:  20 G   Placement technique:  Ultrasound guided   Number of attempts:  1   Transducer: waveform confirmed   Post-procedure details:    Post-procedure:  Biopatch applied, secured with tape and sterile dressing applied   CMS:  Normal   Procedure completion:  Tolerated well, no immediate complications    Terald Sleeper, MD 08/28/21 1718

## 2021-08-28 NOTE — Progress Notes (Addendum)
Pharmacy Antibiotic Note  Kristy [name] is a 75 y.o. female admitted on 08/28/2021 with sepsis. 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 Has hardware in place, but no rifampin added due to concern dermatitis exacerbation. Pharmacy has been consulted for vancomycin and cefepime dosing.  Goal AUC 400-550  Plan: Vancomycin 1750mg  x 1 for load, then vancomycin 1250mg  q 24h (eAUC 537. Scr 1.11) Cefepime 2g q12h Follow renal function, LOT, and de-escalation  Height: 5\' 6"  (167.6 cm) Weight: 84.1 kg (185 lb 8 oz) IBW/kg (Calculated) : 59.3  Temp (24hrs), Avg:90.8 F (32.7 C), Min:90.8 F (32.7 C), Max:90.8 F (32.7 C)  Recent Labs  Lab 08/28/21 0647 08/28/21 0835  WBC 6.7  --   CREATININE 1.14* 1.11*  LATICACIDVEN 1.1 2.6*    Estimated Creatinine Clearance: 47.8 mL/min (A) (by C-G formula based on SCr of 1.11 mg/dL (H)).    No Known Allergies  Antimicrobials this admission: 10/19 cefepime >> 10/19 vancomycin >>    Microbiology results: 10/19 BCx: collected 10/19 UCx: collected    Thank you for allowing pharmacy to be a part of this patient's care.  11/19 08/28/2021 9:57 AM

## 2021-08-28 NOTE — Sepsis Progress Note (Signed)
Notified bedside nurse of need to draw repeat lactic acid (3rd). Per RN, pt is receiving fluid bolus now, and then will draw.

## 2021-08-28 NOTE — ED Provider Notes (Signed)
[facility] Emergency Department Provider Note MRN:  [id number]  Arrival date & time: 08/28/21     Chief Complaint   Altered mental status History of Present Illness   Kristy [name] is a 75 y.o. year-old female with a history of CKD presenting to the ED with chief complaint of altered mental status.  Facility reported a possible aspiration event over the weekend to her daughter.  This evening patient is short of breath, less responsive, requiring oxygen with EMS.  I was unable to obtain an accurate HPI, PMH, or ROS due to the patient's altered mental status.  Level 5 caveat.  Review of Systems  Positive for respiratory failure, altered mental status, hypothermia.  Patient's Health History   No past medical history on file.  Past Surgical History:  Procedure Laterality Date   ANTERIOR CERVICAL DECOMP/DISCECTOMY FUSION N/A 06/16/2021   Procedure: ANTERIOR CERVICAL DISCECTOMY FUSION Cervical five -Cervical six, Evacuation of Epidural Abscess;  Surgeon: Judith Part, MD;  Location: [place];  Service: Neurosurgery;  Laterality: N/A;   IR GASTROSTOMY TUBE MOD SED  07/02/2021    No family history on file.  Social History   Socioeconomic History   Marital status: Married    Spouse name: Not on file   Number of children: Not on file   Years of education: Not on file   Highest education level: Not on file  Occupational History   Not on file  Tobacco Use   Smoking status: Unknown   Smokeless tobacco: Not on file  Substance and Sexual Activity   Alcohol use: Not on file   Drug use: Not on file   Sexual activity: Not on file  Other Topics Concern   Not on file  Social History Narrative   Not on file   Social Determinants of Health   Financial Resource Strain: Not on file  Food Insecurity: Not on file  Transportation Needs: Not on file  Physical Activity: Not on file  Stress: Not on file  Social Connections: Not on file  Intimate Partner  Violence: Not on file     Physical Exam   Vitals:   08/28/21 0720 08/28/21 0723  BP: (!) 143/100   Pulse: 75 (!) 145  Resp: (!) 39   SpO2: (!) 70%     CONSTITUTIONAL: Ill-appearing NEURO: Minimally responsive to pain EYES:  eyes equal and reactive, preserved blink to threat ENT/NECK:  no LAD, no JVD CARDIO: Regular rate, cold poorly perfused PULM: Scattered rhonchi GI/GU:  normal bowel sounds, non-distended, non-tender MSK/SPINE:  No gross deformities, no edema SKIN:  no rash, atraumatic PSYCH: Unable to assess  *Additional and/or pertinent findings included in MDM below  Diagnostic and Interventional Summary    EKG Interpretation  Date/Time:  Wednesday August 28 2021 07:32:54 EDT Ventricular Rate:  104 PR Interval:  164 QRS Duration: 94 QT Interval:  320 QTC Calculation: 421 R Axis:   55 Text Interpretation: Sinus tachycardia Low voltage, precordial leads Abnormal R-wave progression, early transition Borderline repolarization abnormality Confirmed by Gerlene Fee [id number]) on 08/28/2021 7:33:56 AM       Labs Reviewed  RESP PANEL BY RT-PCR (FLU A&B, COVID) [id number]  CULTURE, BLOOD (ROUTINE X 2)  CULTURE, BLOOD (ROUTINE X 2)  URINE CULTURE  PROTIME-INR  APTT  LACTIC ACID, PLASMA  LACTIC ACID, PLASMA  COMPREHENSIVE METABOLIC PANEL  CBC WITH DIFFERENTIAL/PLATELET  URINALYSIS, ROUTINE W REFLEX MICROSCOPIC  AMMONIA  BLOOD GAS, ARTERIAL  I-STAT ARTERIAL BLOOD GAS, ED  TROPONIN I (HIGH SENSITIVITY)    DG Chest Port 1 View  Final Result    CT HEAD WO CONTRAST (5MM)    (Results Pending)  CT CERVICAL SPINE WO CONTRAST    (Results Pending)  CT Chest Wo Contrast    (Results Pending)  DG Chest Portable 1 View    (Results Pending)    Medications  ceFEPIme (MAXIPIME) 2 g in sodium chloride 0.9 % 100 mL IVPB (has no administration in time range)  vancomycin (VANCOREADY) IVPB 1750 mg/350 mL (has no administration in time range)  ketamine HCl 50 MG/5ML SOSY (has no  administration in time range)  succinylcholine (ANECTINE) 200 MG/10ML syringe (has no administration in time range)  midazolam (VERSED) 2 MG/2ML injection (has no administration in time range)  fentaNYL (SUBLIMAZE) 50 MCG/ML injection (has no administration in time range)  propofol (DIPRIVAN) 1000 MG/100ML infusion (has no administration in time range)  fentaNYL (SUBLIMAZE) injection 25 mcg (has no administration in time range)  fentaNYL 254mg in NS 2544m(1075mml) infusion-PREMIX (50 mcg/hr Intravenous New Bag/Given 08/28/21 0712)  fentaNYL (SUBLIMAZE) bolus via infusion 25-100 mcg (has no administration in time range)  amiodarone (NEXTERONE PREMIX) 360-4.14 MG/200ML-% (1.8 mg/mL) IV infusion (has no administration in time range)  amiodarone (NEXTERONE) IV bolus only 150 mg/100 mL (150 mg Intravenous New Bag/Given 08/28/21 0728)  lactated ringers bolus 1,000 mL (1,000 mLs Intravenous New Bag/Given 08/28/21 0708)  rocuronium bromide 100 MG/10ML SOSY (100 mg  Given 08/28/21 0704)  etomidate (AMIDATE) 2 MG/ML injection (10 mg  Given 08/28/21 0703)  EPINEPHrine (ADRENALIN) 1 MG/10ML injection (0.1 mg Intravenous Given 08/28/21 0719)     Procedures  /  Critical Care Procedure Name: Intubation Date/Time: 08/28/2021 7:13 AM Performed by: BerMaudie FlakesD Pre-anesthesia Checklist: Patient identified, Patient being monitored, Emergency Drugs available, Timeout performed and Suction available Oxygen Delivery Method: Non-rebreather mask Preoxygenation: Pre-oxygenation with 100% oxygen Induction Type: Rapid sequence Ventilation: Mask ventilation without difficulty Laryngoscope Size: Mac and 3 Grade View: Grade I Tube size: 7.5 mm Number of attempts: 1 Airway Equipment and Method: Stylet Placement Confirmation: ETT inserted through vocal cords under direct vision, CO2 detector and Breath sounds checked- equal and bilateral Secured at: 25 cm Tube secured with: ETT holder Comments: RSI using  10 mg etomidate, 100 mg rocuronium.    .Critical Care Performed by: BerMaudie FlakesD Authorized by: BerMaudie FlakesD   Critical care provider statement:    Critical care time (minutes):  35   Critical care time was exclusive of:  Separately billable procedures and treating other patients   Critical care was necessary to treat or prevent imminent or life-threatening deterioration of the following conditions:  Respiratory failure and sepsis   Critical care was time spent personally by me on the following activities:  Ordering and performing treatments and interventions, ordering and review of laboratory studies, ordering and review of radiographic studies, pulse oximetry, re-evaluation of patient's condition, ventilator management, development of treatment plan with patient or surrogate, discussions with consultants, evaluation of patient's response to treatment and examination of patient   Care discussed with: admitting provider   CPR  Date/Time: 08/28/2021 7:32 AM Performed by: BerMaudie FlakesD Authorized by: BerMaudie FlakesD  CPR Procedure Details:      Amount of time prior to administration of ACLS/BLS (minutes):  2   ACLS/BLS initiated by EMS: No     CPR/ACLS performed in the ED: Yes     Duration of CPR (  minutes):  2   Outcome: ROSC obtained    CPR performed via ACLS guidelines under my direct supervision.  See RN documentation for details including defibrillator use, medications, doses and timing. .Cardioversion  Date/Time: 08/28/2021 7:32 AM Performed by: Maudie Flakes, MD Authorized by: Maudie Flakes, MD   Consent:    Consent obtained:  Emergent situation Pre-procedure details:    Cardioversion basis:  Emergent   Rhythm:  Ventricular tachycardia   Electrode placement:  Anterior-posterior Patient sedated: Yes. Refer to sedation procedure documentation for details of sedation.  Attempt one:    Cardioversion mode:  Synchronous   Waveform:  Biphasic    Shock (Joules):  150   Shock outcome:  No change in rhythm Attempt two:    Shock (Joules):  200   Shock outcome:  Conversion to other rhythm  ED Course and Medical Decision Making  I have reviewed the triage vital signs, the nursing notes, and pertinent available records from the EMR.  Listed above are laboratory and imaging tests that I personally ordered, reviewed, and interpreted and then considered in my medical decision making (see below for details).  Concern for sepsis due to aspiration pneumonia, also considering metabolic disarray, hepatic encephalopathy, intracranial bleeding.  Providing empiric antibiotics, fluids.  Patient has a low GCS and is on nonrebreather oxygen.  Discussed goals of care over the phone with daughter, who confirms patient is full code. Monitor airway closely.     Patient with continued intermittent hypoxia episodes into the 80s, chest x-ray with significant opacification on the left.  Patient does not have the mental status to tolerate CPAP/BiPAP.  Discussed again with family and proceeded with intubation as described above.  Will admit to intensivist service.  7:15 AM update: Patient with significant bradycardia episode, blood pressures down into the 70s.  Called to bedside.  On my evaluation patient's heart rate in the 30s, cannot palpate pulse.  CPR initiated at 7:19 AM and continued for 2 minutes.  Received 1 mg epinephrine IV.  After 2 minutes patient had return of pulses on recheck.  Heart rate between 110 and 120.  Soon after patient converted into ventricular tachycardia with a pulse.  Rate in the 160s.  Synchronized cardioversion performed x2 at 150 and 200 J respectively.  Eventual conversion to narrow complex rhythm.  Awaiting ICU evaluation, care signed out to Dr. Langston Masker at shift change.  Barth Kirks. Sedonia Small, [place] [email or web address]  Final Clinical Impressions(s) / ED Diagnoses     ICD-10-CM    1. Sepsis, due to unspecified organism, unspecified whether acute organ dysfunction present ([place])  A41.9     2. Aspiration of food, initial encounter  T17.928A     3. Altered mental status, unspecified altered mental status type  R41.82     4. Acute respiratory failure with hypoxia ([facility])  J96.01       ED Discharge Orders     None        Discharge Instructions Discussed with and Provided to Patient:   Discharge Instructions   None       Maudie Flakes, MD 08/28/21 [phone]

## 2021-08-28 NOTE — Progress Notes (Signed)
  Echocardiogram 2D Echocardiogram has been performed.  Gerda Diss 08/28/2021, 5:33 PM

## 2021-08-28 NOTE — H&P (Signed)
NAME:  Kristy [name], MRN:  [id number], DOB:  [birth year], LOS: 0 ADMISSION DATE:  08/28/2021, CONSULTATION DATE:  08/28/2021 REFERRING MD:  Dr. Sedonia Small, CHIEF COMPLAINT:  Cardiac arrest with sepsis    History of Present Illness:  Kristy [name] is a 75 y.o. 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 Seen with elevated LFTs as well with reported ETOH use.Head and neck CT negative  Admitted to [facility] for deconditioning and acute encephalopathy  08/05 encephalopathy worsened and neuro and psychiatry was consulted. Psych was concerned for UTI for which UA was obtained and positive  08/07 MRI cervical, lumbar, and thoracic spine revealed discitis osteomyelitis at C5-6, C6-7, and possibly T1-2. 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   NSGY consulted and recommended surgical evacuation > C5-C6 Anterior Cervical Discectomy and Instrumented Fusion preformed later that day with large amount of pus seen  Treated with vancomycin and ceftriaxone  08/09 ABT changed to IV nafcillin for 8/7 epidural abscess cultures growing MSS ID discharge ABT plan: Domino dosed 8/07 - 06/30/21 followed by Cefazolin pharmacy dosed 8/22 - 08/11/2021 with Rifampin 600 mg BID to be continued throughout the treatment as she can tolerate  08/18 ENT consulted for right nostril epistaxis  08/23 PEG tube placed  09/01 discharged to SNF [facility]) in stable condition, had PEG tube in place for severe protein calorie malnutrition with high aspiration risk.  Also had indwelling Foley in place due to recurrent urinary retention. 09/10 presented back to [facility] emergency department due to altered mental status and concern for underlying infection.  Request per family made to transfer  to outside facility but no beds currently available.   CT/MRI imaging on admission revealed acute ischemic pontine infarct additionally concern for T12 and L1  discitis/osteomyelitis 9/11 Neurosurgery reconsulted and recommended against any further surgical interventions for persistent/progressive cervical and thoracic osteomyelitis 9/13:Repeat MRI brain w/ contrast:no intracranial focus of abnormal contrast 9/14 ID recommended to continue with cefazolin through 08/25/2021, outpatient ID follow-up.  Neurology and neurosurgery has signed off. 9/16 palliative care consulted full scope of treatment requested  9/27 discharged to SNF [facility]) ID  advised to continue Cefazolin through 08/25/2021 w/ PICC line-which will be inserted prior to discharge today.     Patient presented again to [facility] emergency department from local skilled nursing facility with complaints of shortness of breath.  Due to altered mental status and hypoxia patient intubated in ED.  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  Due to altered mental status and hypoxia patient intubated in ED.  Shortly after arrival patient cardiac arrested x2 with response to epi and cardioversion PCCM consulted for further management and admission  Interim History / Subjective:  As above  Objective   Blood pressure (!) 195/138, pulse 100, resp. rate (!) 57, height _0  (1.676 m), weight 84.1 kg, SpO2 (!) 85 %.    Vent Mode: PRVC FiO2 (%):  [100 %] 100 % Set Rate:  [20 bmp]  20 bmp Vt Set:  [470 mL] 470 mL PEEP:  [5 cmH20] 5 cmH20 Plateau Pressure:  [16 cmH20] 16 cmH20  No intake or output data in  the 24 hours ending 08/28/21 0757 Filed Weights   08/28/21 0600  Weight: 84.1 kg    Examination: General: Acute on chronic deconditioned elderly female lying in bed unresponsive on ventilator HEENT: ETT, MM pink/moist, PERRL,  Neuro: Unresponsive CV: s1s2 regular rate and rhythm, no murmur, rubs, or gallops,  PULM: Bilateral rhonchi with thick white secretions frothing from ET tube GI: soft, bowel sounds active in all 4 quadrants, non-tender, non-distended, Extremities: warm/dry, generalized 2+ edema  Skin: no rashes or lesions  Resolved Hospital Problem list     Assessment & Plan:  Evolving sepsis with multiple options for infectious sources -History of 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, paraspinal muscle abscess, UTI, prior staph aureus bacteremia -Patient has chronic PICC line and Foley catheter in place -Patient met criteria for sepsis on admission secondary to tachycardia, hypothermia, leukopenia, positive lactic acidosis with acute concern for recurrent/persistent infection P: Admit ICU Vent support as below Pan cultures prior to antibiotic Broad-spectrum IV Appears intravascularly dry but is currently third spacing due to profound malnutrition, gentle IV hydration provided on admission Wean pressor support for MAP goal greater than 65 Trend lactic acid Procalcitonin Monitor urine output Capillary refill Follow CT imaging to determine ID versus neurosurgery versus neurology reconsult  Acute Hypoxic Respiratory Failure  -Concern for aspiration pneumonia  P: Continue ventilator support with lung protective strategies  Wean PEEP and FiO2 for sats greater than 90%. Head of bed elevated 30 degrees. Plateau pressures less than 30 cm H20.  Follow intermittent chest x-ray and ABG.   SAT/SBT as tolerated, mentation preclude extubation  Ensure adequate pulmonary hygiene  Follow cultures  VAP bundle in place  PAD protocol IV antibiotics as  above  Recent embolic stroke -MRI 1.5 cm with acute ischemic likely affecting multiple penetrating arteries due to small vessel disease. 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  It appeared to be PEA which responded to 2 minutes CPR 1 epi push.  After epi patient had V. tach which was cardioverted P: Normothermia protocol Continuous telemetry Optimize electrolytes Limited echo Pressor support  Acute Kidney Injury  -Creatinine on admission 1.14 creatinine 0.859/26 P: Follow renal function  Monitor urine output Trend Bmet Avoid nephrotoxins Ensure adequate renal perfusion  IV hydration  Thrombocytopenia -Platelet count 84, platelets 209/26 P: Trend CBC Monitor for signs of bleeding Transfuse for platelets less than 10  Urinary retention requiring chronic indwelling Foley -Patient presented with Foley from SNF P: Exchange Foley Urinalysis and urine culture pending Antibiotics as above  Severe protein calorie malnutrition -PEG placed 8/23. Daughter states that patein had been consuming some oral nutrition at the SNF with nocturnal TFs Deconditioning  P: Supplement nutrition  Start tube feeds tomorrow   Best Practice (right click and "Reselect all SmartList Selections" daily)   Diet/type: NPO DVT prophylaxis: systemic heparin GI prophylaxis: PPI Lines: Central line Foley:  Yes, and it is  still needed Code Status:  full code Last date of  multidisciplinary goals of care discussion: Family updated at bedside in the ED  Labs   CBC: No results for input(s): WBC, NEUTROABS, HGB, HCT, MCV, PLT in the last 168 hours.  Basic Metabolic Panel: Recent Labs  Lab 08/28/21 0647  NA 137  K 5.7*  CL 105  CO2 26  GLUCOSE 86  BUN 49*  CREATININE 1.14*  CALCIUM 9.5   GFR: Estimated Creatinine Clearance: 46.6 mL/min (A) (by C-G formula based on SCr of 1.14 mg/dL (H)). Recent Labs  Lab 08/28/21 0647  LATICACIDVEN 1.1    Liver Function Tests: Recent Labs  Lab 08/28/21 0647  AST 23  ALT 5  ALKPHOS 108  BILITOT 0.6  PROT 7.3  ALBUMIN 2.6*   No results for input(s): LIPASE, AMYLASE in the last 168 hours. No results for input(s): AMMONIA in the last 168 hours.  ABG    Component Value Date/Time   TCO2 25 07/20/2021 1820     Coagulation Profile: Recent Labs  Lab 08/28/21 0647  INR 1.2    Cardiac Enzymes: No results for input(s): CKTOTAL, CKMB, CKMBINDEX, TROPONINI in the last 168 hours.  HbA1C: Hgb A1c MFr Bld  Date/Time Value Ref Range Status  07/21/2021 05:00 AM 5.1 4.8 - 5.6 % Final    Comment:    (NOTE) Pre diabetes:          5.7%-6.4%  Diabetes:              >6.4%  Glycemic control for   <7.0% adults with diabetes     CBG: No results for input(s): GLUCAP in the last 168 hours.  Review of Systems:   Unable to assess   Past Medical History:  She,  has no past medical history on file.   Surgical History:   Past Surgical History:  Procedure Laterality Date   ANTERIOR CERVICAL DECOMP/DISCECTOMY FUSION N/A 06/16/2021   Procedure: ANTERIOR CERVICAL DISCECTOMY FUSION Cervical five -Cervical six, Evacuation of Epidural Abscess;  Surgeon: Judith Part, MD;  Location: [place];  Service: Neurosurgery;  Laterality: N/A;   IR GASTROSTOMY TUBE MOD SED  07/02/2021     Social History:      Family History:  Her family history is not on file.   Allergies No Known Allergies   Home Medications   Prior to Admission medications   Medication Sig Start Date End Date Taking? Authorizing Provider  acetaminophen (TYLENOL) 500 MG tablet Place 1 tablet (500 mg total) into feeding tube every 6 (six) hours as needed for mild pain (or Fever >/= 101). 08/06/21   Antonieta Pert, MD  albuterol (PROVENTIL) (2.5 MG/3ML) 0.083% nebulizer solution Take 3 mLs (2.5 mg total) by nebulization every 6 (six) hours as needed for wheezing or shortness of breath. 08/06/21   Antonieta Pert, MD  allopurinol (ZYLOPRIM) 100 MG tablet Take 100 mg by mouth daily.    [provider]  aspirin 81 MG chewable tablet Place 1 tablet (81 mg total) into feeding tube daily. 08/07/21   Antonieta Pert, MD  atorvastatin (LIPITOR) 40 MG tablet Place 1 tablet (40 mg total) into feeding tube at bedtime. 08/06/21   Antonieta Pert, MD  bisacodyl (DULCOLAX) 10 MG suppository Place 10 mg rectally as needed for moderate constipation.    [provider]  ceFAZolin (ANCEF) IVPB Inject 2 g into the vein every 8 (eight) hours. Indication:  bacteremia First Dose: Yes Last Day of Therapy:  08/25/21 Labs -  Once weekly:  CBC/D and BMP, Labs - Every other week:  ESR and CRP Method of administration: IV Push Method of administration may be changed at the discretion of home infusion pharmacist based upon assessment of the patient and/or caregiver's ability to self-administer the medication ordered. 08/06/21 09/07/21  Antonieta Pert, MD  diclofenac Sodium (VOLTAREN) 1 % GEL Apply 4 g topically 4 (four) times daily. To neck 07/10/21   Samella Parr, NP  Emollient (LUBRIDERM SERIOUSLY SENSITIVE) LOTN Apply 1 application topically 2 (two) times daily. 08/06/21   Antonieta Pert, MD  folic acid (FOLVITE) 1 MG tablet Place 1 tablet (1 mg total) into feeding tube daily. 07/11/21   Samella Parr, NP  hydrOXYzine (ATARAX/VISTARIL) 25 MG tablet Place 0.5 tablets (12.5 mg total) into feeding tube every 8 (eight) hours as needed for itching. 08/06/21   Antonieta Pert, MD   loperamide HCl (IMODIUM) 1 MG/7.5ML suspension Place 15 mLs (2 mg total) into feeding tube as needed for diarrhea or loose stools. 08/06/21   Antonieta Pert, MD  Magnesium Hydroxide (MILK OF MAGNESIA PO) Take 30 mLs by mouth daily as needed (constipation).    [provider]  melatonin 3 MG TABS tablet Take 1 tablet (3 mg total) by mouth at bedtime. 08/06/21   Antonieta Pert, MD  Multiple Vitamin (MULTIVITAMIN) LIQD Place 15 mLs into feeding tube daily. 07/11/21   Samella Parr, NP  niacin 500 MG tablet Place 1 tablet (500 mg total) into feeding tube at bedtime. 07/10/21   Samella Parr, NP  Nutritional Supplements (FEEDING SUPPLEMENT, JEVITY 1.5 CAL/FIBER,) LIQD Place 1,000 mLs into feeding tube continuous. 08/06/21   Antonieta Pert, MD  Nutritional Supplements (FEEDING SUPPLEMENT, PROSOURCE TF,) liquid Place 45 mLs into feeding tube 3 (three) times daily. 08/06/21   Antonieta Pert, MD  OLANZapine (ZYPREXA) 2.5 MG tablet Place 1 tablet (2.5 mg total) into feeding tube daily. 08/07/21   Antonieta Pert, MD  OLANZapine (ZYPREXA) 5 MG tablet Place 1 tablet (5 mg total) into feeding tube at bedtime. 08/06/21   Antonieta Pert, MD  oxyCODONE (OXY IR/ROXICODONE) 5 MG immediate release tablet Take 1 tablet (5 mg total) by mouth 2 (two) times daily as needed for up to 4 doses for breakthrough pain or severe pain. 08/06/21   Antonieta Pert, MD  saccharomyces boulardii (FLORASTOR) 250 MG capsule Place 1 capsule (250 mg total) into feeding tube 2 (two) times daily. 08/06/21 09/05/21  Antonieta Pert, MD  thiamine 100 MG tablet Place 1 tablet (100 mg total) into feeding tube daily. 07/11/21   Samella Parr, NP  Water For Irrigation, Sterile (FREE WATER) SOLN Place 200 mLs into feeding tube every 4 (four) hours. 08/06/21   Antonieta Pert, MD     Critical care time:    Performed by: Kaevion Sinclair D. Harris  Total critical care time: 55 minutes  Critical care time was exclusive of separately billable procedures and treating other  patients.  Critical care was necessary to treat or prevent imminent or life-threatening deterioration.  Critical care was time spent personally by me on the following activities: development of treatment plan with patient and/or surrogate as well as nursing, discussions with consultants, evaluation of patient's response to treatment, examination of patient, obtaining history from patient or surrogate, ordering and performing treatments and interventions, ordering and review of laboratory studies, ordering and review of radiographic studies, pulse oximetry and re-evaluation of patient's condition.  Makai Agostinelli D. Kenton Kingfisher, NP-C [facility] Personal contact information  can be found on Amion  08/28/2021, 11:10 AM

## 2021-08-28 NOTE — ED Notes (Signed)
Dr. Pilar Plate deciding to intubate patient.

## 2021-08-28 NOTE — Progress Notes (Signed)
Hypoglycemic Event  CBG: 50  Treatment: D50 50 mL (25 gm)  Symptoms: None  Follow-up CBG: Time:2005 CBG Result:143  Possible Reasons for Event: Inadequate meal intake  Comments/MD notified:hypoglycemic protocol followed     Kristy [name]

## 2021-08-28 NOTE — Procedures (Signed)
Patient Name: Kristy [name]  MRN: [id number]  Epilepsy Attending: Charlsie Quest  Referring Physician/Provider: Dr Dellia Cloud Date: 08/28/2021 Duration: 38.13 mins  Patient history: 75yo F with ams and cardiac arrest. EEG to evaluate for seizure.  Level of alertness: comatose  AEDs during EEG study: None  Technical aspects: This EEG study was done with scalp electrodes positioned according to the 10-20 International system of electrode placement. Electrical activity was acquired at a sampling rate of 500Hz  and reviewed with a high frequency filter of 70Hz  and a low frequency filter of 1Hz . EEG data were recorded continuously and digitally stored.   Description: EEG showed continuous generalized 3 to 6 Hz theta-delta slowing. Hyperventilation and photic stimulation were not performed.     ABNORMALITY - Continuous slow, generalized  IMPRESSION: This study is suggestive of severe diffuse encephalopathy, nonspecific etiology. No seizures or epileptiform discharges were seen throughout the recording.  Ethanael Veith 

## 2021-08-28 NOTE — Progress Notes (Signed)
Hypoglycemic Event  CBG: 54  Treatment: D50 50 mL (25 gm)  Symptoms: None  Follow-up CBG: Time:1631 CBG Result:118  Possible Reasons for Event: Inadequate meal intake  Comments/MD notified: hypoglycemic protocol followed    Hardeep Reetz, [place]

## 2021-08-28 NOTE — Progress Notes (Signed)
Ventilator patient transported from ED33 to CT to 3M13 without any complications.

## 2021-08-28 NOTE — ED Notes (Signed)
Central line to be placed at [facility] time

## 2021-08-28 NOTE — Sepsis Progress Note (Signed)
Pt currently being Intubated. Labs will be drawn shortly.

## 2021-08-28 NOTE — ED Notes (Signed)
[employer] in place.

## 2021-08-28 NOTE — Progress Notes (Signed)
EEG complete - results pending 

## 2021-08-28 NOTE — Progress Notes (Signed)
PHARMACY - PHYSICIAN COMMUNICATION CRITICAL VALUE ALERT - BLOOD CULTURE IDENTIFICATION (BCID)  Kristy [name] is an 75 y.o. female who presented to [facility] on 08/28/2021 with a chief complaint of septic shock.   Name of physician (or Provider) Contacted: e-Link  Current antibiotics: Vancomycin + Unasyn  Changes to prescribed antibiotics recommended:  Recommendations accepted by provider  Results for orders placed or performed during the hospital encounter of 08/28/21  Blood Culture ID Panel (Reflexed) (Collected: 08/28/2021  6:13 AM)  Result Value Ref Range   Enterococcus faecalis NOT DETECTED NOT DETECTED   Enterococcus Faecium NOT DETECTED NOT DETECTED   Listeria monocytogenes NOT DETECTED NOT DETECTED   Staphylococcus species DETECTED (A) NOT DETECTED   Staphylococcus aureus (BCID) NOT DETECTED NOT DETECTED   Staphylococcus epidermidis NOT DETECTED NOT DETECTED   Staphylococcus lugdunensis NOT DETECTED NOT DETECTED   Streptococcus species NOT DETECTED NOT DETECTED   Streptococcus agalactiae NOT DETECTED NOT DETECTED   Streptococcus pneumoniae NOT DETECTED NOT DETECTED   Streptococcus pyogenes NOT DETECTED NOT DETECTED   A.calcoaceticus-baumannii DETECTED (A) NOT DETECTED   Bacteroides fragilis NOT DETECTED NOT DETECTED   Enterobacterales NOT DETECTED NOT DETECTED   Enterobacter cloacae complex NOT DETECTED NOT DETECTED   Escherichia coli NOT DETECTED NOT DETECTED   Klebsiella aerogenes NOT DETECTED NOT DETECTED   Klebsiella oxytoca NOT DETECTED NOT DETECTED   Klebsiella pneumoniae NOT DETECTED NOT DETECTED   Proteus species NOT DETECTED NOT DETECTED   Salmonella species NOT DETECTED NOT DETECTED   Serratia marcescens NOT DETECTED NOT DETECTED   Haemophilus influenzae NOT DETECTED NOT DETECTED   Neisseria meningitidis NOT DETECTED NOT DETECTED   Pseudomonas aeruginosa NOT DETECTED NOT DETECTED   Stenotrophomonas maltophilia NOT DETECTED NOT DETECTED   Candida albicans  NOT DETECTED NOT DETECTED   Candida auris NOT DETECTED NOT DETECTED   Candida glabrata NOT DETECTED NOT DETECTED   Candida krusei NOT DETECTED NOT DETECTED   Candida parapsilosis NOT DETECTED NOT DETECTED   Candida tropicalis NOT DETECTED NOT DETECTED   Cryptococcus neoformans/gattii NOT DETECTED NOT DETECTED   CTX-M ESBL NOT DETECTED NOT DETECTED   Carbapenem resistance IMP NOT DETECTED NOT DETECTED   Carbapenem resistance KPC NOT DETECTED NOT DETECTED   Carbapenem resistance NDM NOT DETECTED NOT DETECTED   Carbapenem resistance VIM NOT DETECTED NOT DETECTED    Mosetta Anis 08/28/2021  9:54 PM

## 2021-08-28 NOTE — Sepsis Progress Note (Signed)
Notified provider of need to order repeat lactic acid (3rd) and more fluid per protocol. Orders promptly received. Greatly appreciated.

## 2021-08-28 NOTE — ED Notes (Signed)
Attempted to call report

## 2021-08-29 ENCOUNTER — Inpatient Hospital Stay (HOSPITAL_COMMUNITY): Payer: Medicare Other

## 2021-08-29 DIAGNOSIS — G062 Extradural and subdural abscess, unspecified: Secondary | ICD-10-CM

## 2021-08-29 DIAGNOSIS — L8990 Pressure ulcer of unspecified site, unspecified stage: Secondary | ICD-10-CM | POA: Insufficient documentation

## 2021-08-29 DIAGNOSIS — E43 Unspecified severe protein-calorie malnutrition: Secondary | ICD-10-CM | POA: Insufficient documentation

## 2021-08-29 DIAGNOSIS — A498 Other bacterial infections of unspecified site: Secondary | ICD-10-CM

## 2021-08-29 LAB — BASIC METABOLIC PANEL WITH GFR
Anion gap: 10 (ref 5–15)
BUN: 46 mg/dL — ABNORMAL HIGH (ref 8–23)
CO2: 22 mmol/L (ref 22–32)
Calcium: 9.1 mg/dL (ref 8.9–10.3)
Chloride: 110 mmol/L (ref 98–111)
Creatinine, Ser: 1.26 mg/dL — ABNORMAL HIGH (ref 0.44–1.00)
GFR, Estimated: 45 mL/min — ABNORMAL LOW
Glucose, Bld: 128 mg/dL — ABNORMAL HIGH (ref 70–99)
Potassium: 3.7 mmol/L (ref 3.5–5.1)
Sodium: 142 mmol/L (ref 135–145)

## 2021-08-29 LAB — GLUCOSE, CAPILLARY
Glucose-Capillary: 112 mg/dL — ABNORMAL HIGH (ref 70–99)
Glucose-Capillary: 113 mg/dL — ABNORMAL HIGH (ref 70–99)
Glucose-Capillary: 122 mg/dL — ABNORMAL HIGH (ref 70–99)
Glucose-Capillary: 56 mg/dL — ABNORMAL LOW (ref 70–99)
Glucose-Capillary: 57 mg/dL — ABNORMAL LOW (ref 70–99)
Glucose-Capillary: 58 mg/dL — ABNORMAL LOW (ref 70–99)
Glucose-Capillary: 62 mg/dL — ABNORMAL LOW (ref 70–99)
Glucose-Capillary: 72 mg/dL (ref 70–99)
Glucose-Capillary: 72 mg/dL (ref 70–99)

## 2021-08-29 LAB — CBC
HCT: 24.6 % — ABNORMAL LOW (ref 36.0–46.0)
Hemoglobin: 7.3 g/dL — ABNORMAL LOW (ref 12.0–15.0)
MCH: 32.2 pg (ref 26.0–34.0)
MCHC: 29.7 g/dL — ABNORMAL LOW (ref 30.0–36.0)
MCV: 108.4 fL — ABNORMAL HIGH (ref 80.0–100.0)
Platelets: 72 10*3/uL — ABNORMAL LOW (ref 150–400)
RBC: 2.27 MIL/uL — ABNORMAL LOW (ref 3.87–5.11)
RDW: 18.5 % — ABNORMAL HIGH (ref 11.5–15.5)
WBC: 8.2 10*3/uL (ref 4.0–10.5)
nRBC: 0.2 % (ref 0.0–0.2)

## 2021-08-29 LAB — PHOSPHORUS: Phosphorus: 2.7 mg/dL (ref 2.5–4.6)

## 2021-08-29 LAB — URINE CULTURE

## 2021-08-29 LAB — MAGNESIUM: Magnesium: 1.9 mg/dL (ref 1.7–2.4)

## 2021-08-29 LAB — TRIGLYCERIDES: Triglycerides: 91 mg/dL

## 2021-08-29 LAB — MRSA NEXT GEN BY PCR, NASAL: MRSA by PCR Next Gen: DETECTED — AB

## 2021-08-29 IMAGING — DX DG ABD PORTABLE 1V
1 series · 1 of 1 positions shown · non-contrast
Comparison: Chest x-ray [DATE]

CLINICAL DATA: Encounter for orogastric (OG) tube placement [SW]
([SW]-CM)

EXAM:
PORTABLE ABDOMEN - 1 VIEW

[abdomen]
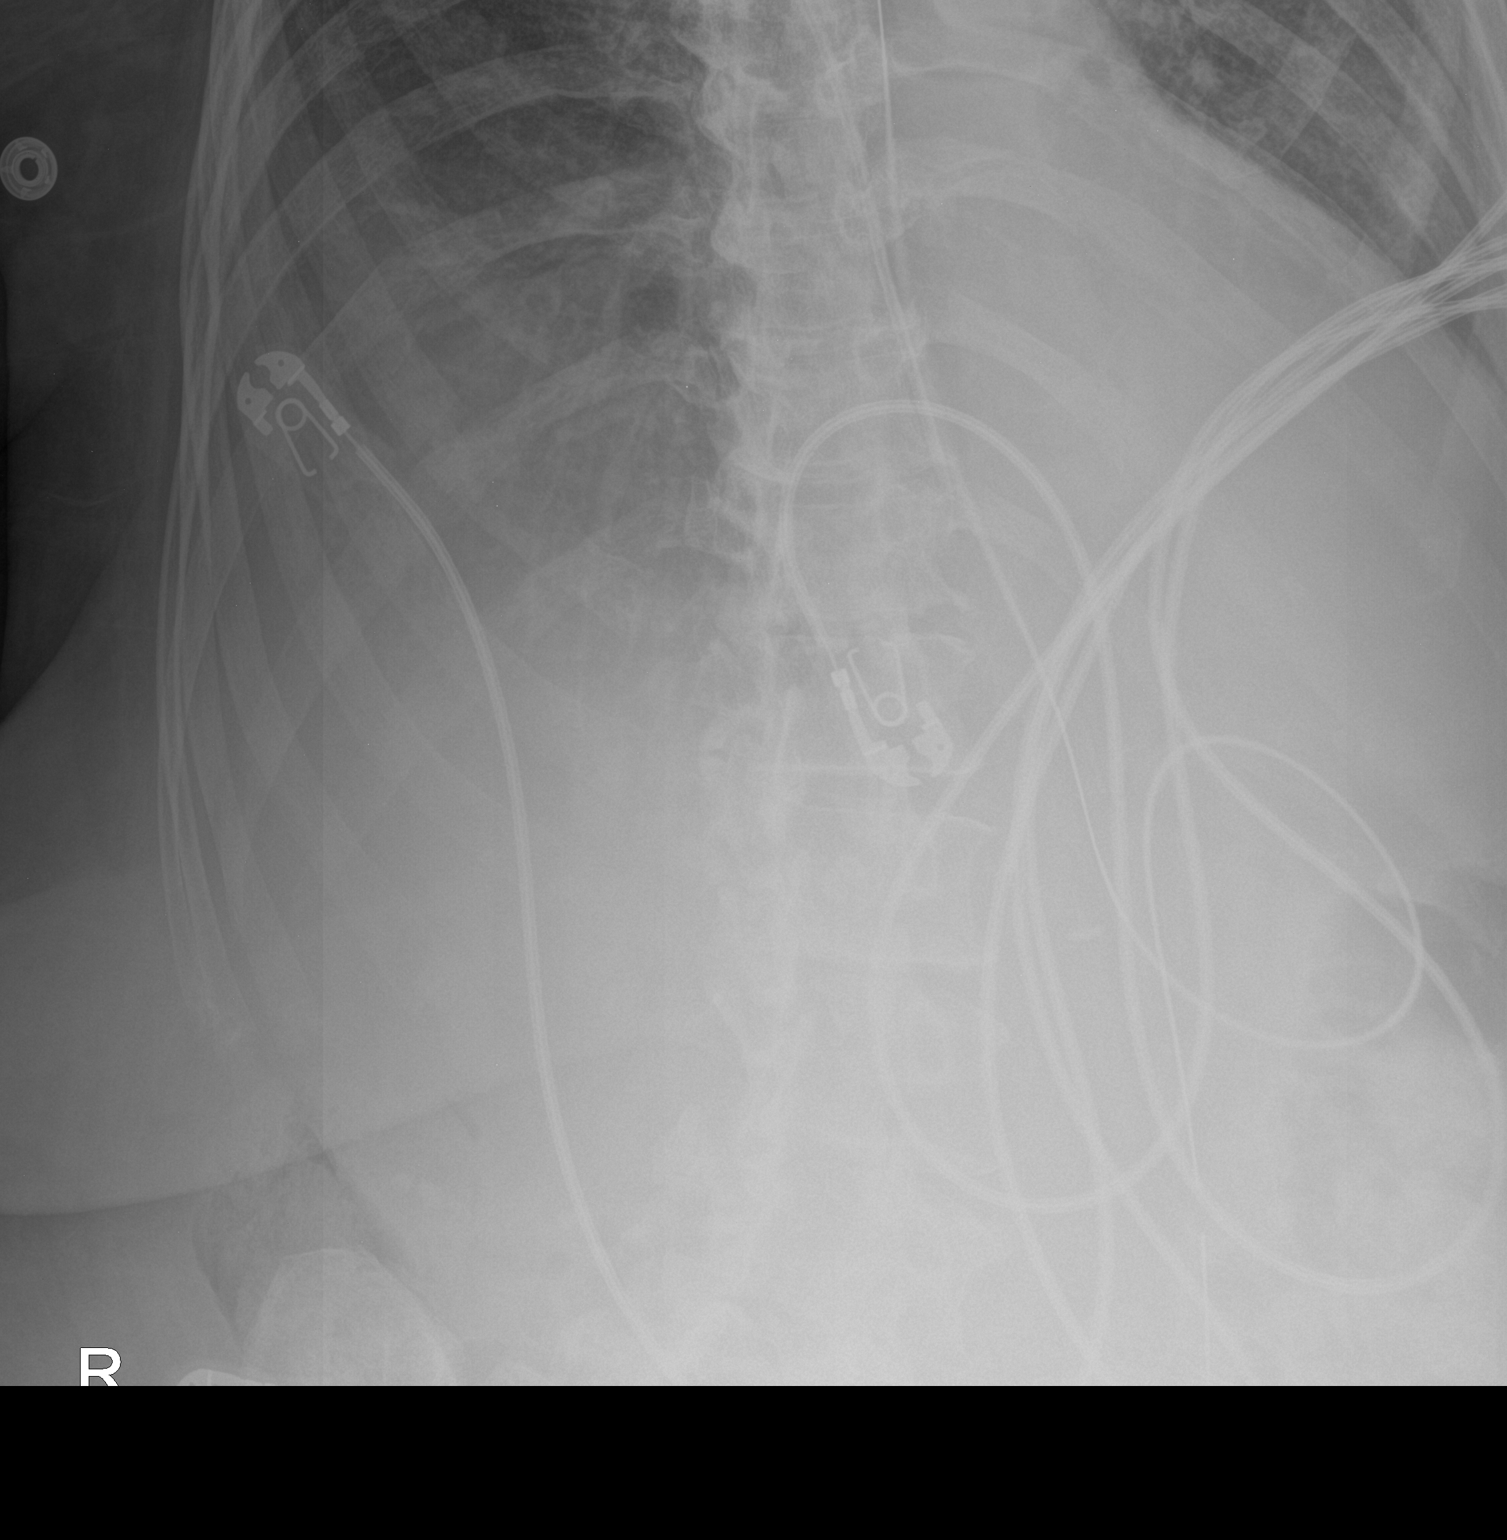

[1 of 1 positions shown; findings below may reference images not displayed]

FINDINGS: Limited radiograph of the lower chest and upper abdomen was obtained
for the purposes of enteric tube localization. Enteric tube is seen
coursing below the diaphragm with side port projecting within the
expected location of the gastric body, and distal tip extending
inferiorly beyond the field of view. Partially visualized right IJ
central venous catheter, likely within the region of the right
atrium. Hazy opacity at the right lung base, compatible with known
layering pleural effusion.
IMPRESSION: Enteric tube tip and side port project within the expected location
of the gastric body.

## 2021-08-29 IMAGING — CT CT ABD-PELV W/O CM
2 of 4 series · 16 of 46 positions shown, 18 images · non-contrast
Comparison: [DATE], [DATE]

CLINICAL DATA: Abdominal pain

EXAM:
CT ABDOMEN AND PELVIS WITHOUT CONTRAST
TECHNIQUE: Multidetector CT imaging of the abdomen and pelvis was performed
following the standard protocol without IV contrast.

[Series 3: a/p w/o 5mm · axial · non-contrast · 0.72mm/px · z∈[+737,+1137]mm · 13 of 88 slices shown, 15 images]
[im 4/88  soft-tissue]
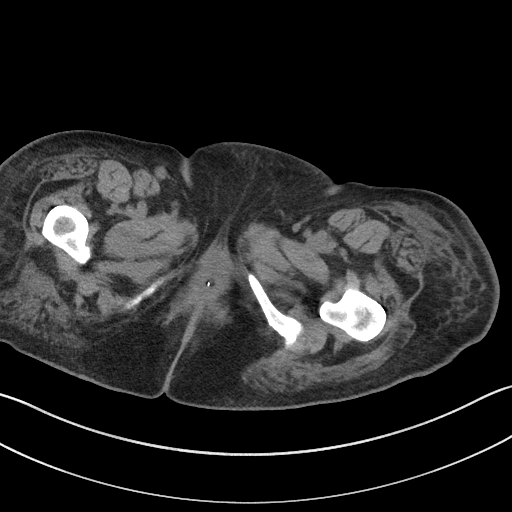
[im 4/88  bone]
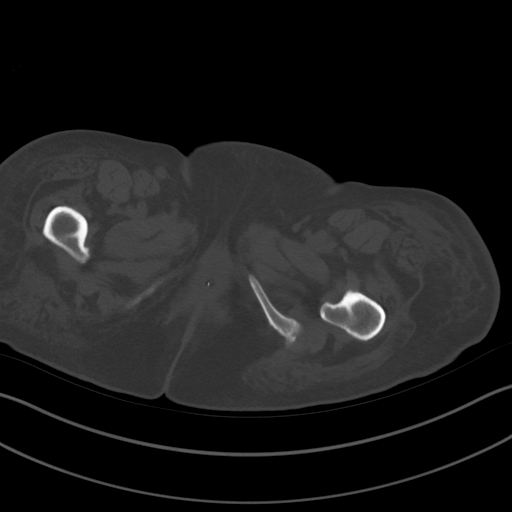
[im 11/88  soft-tissue]
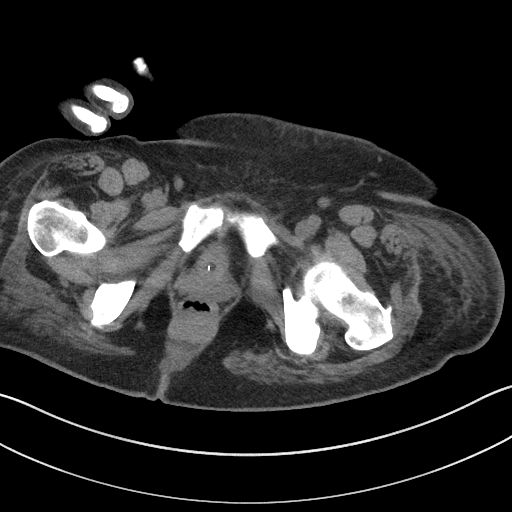
[im 18/88  soft-tissue]
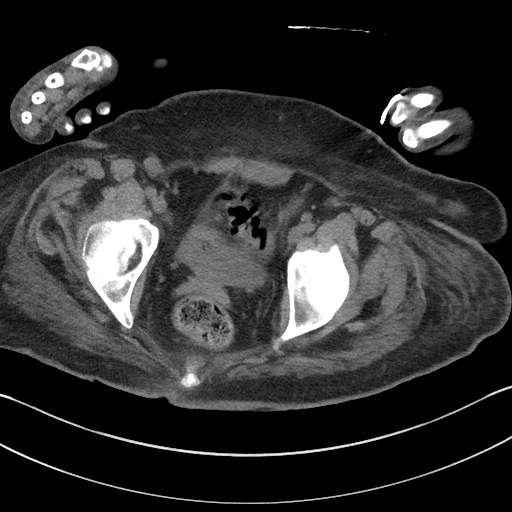
[im 25/88  soft-tissue]
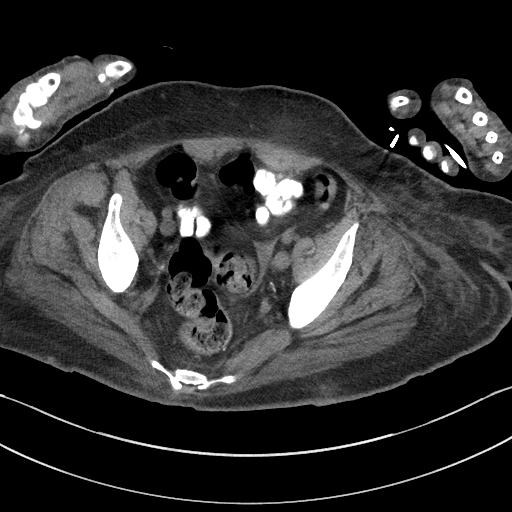
[im 32/88  soft-tissue]
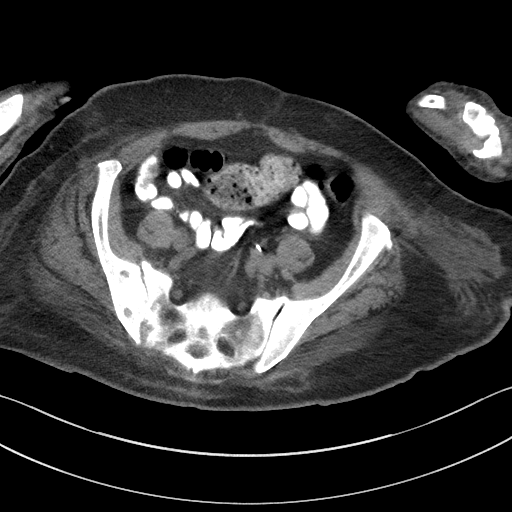
[im 39/88  soft-tissue]
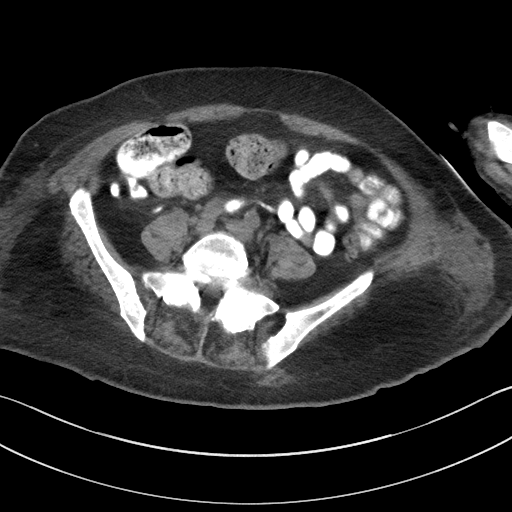
[im 46/88  soft-tissue]
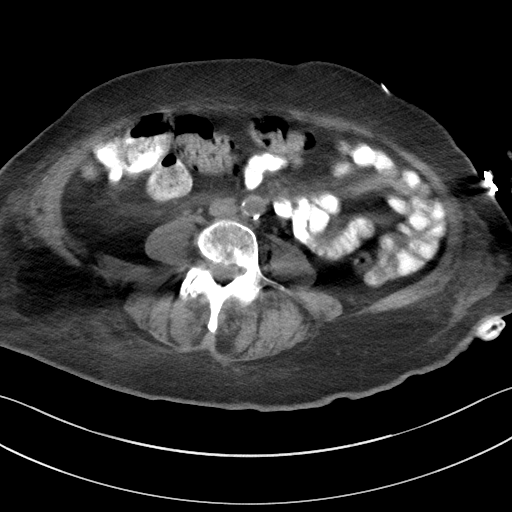
[im 49/88  soft-tissue]
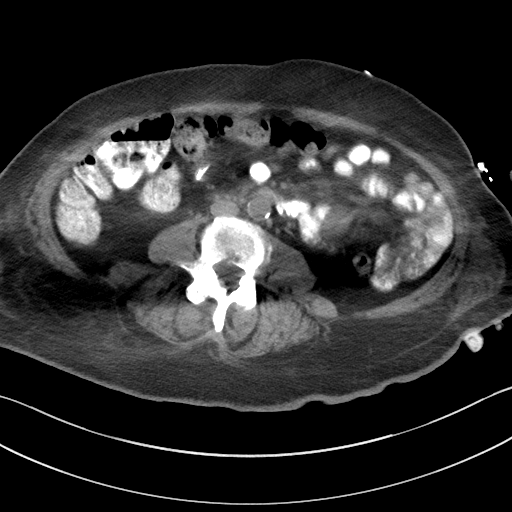
[im 56/88  soft-tissue]
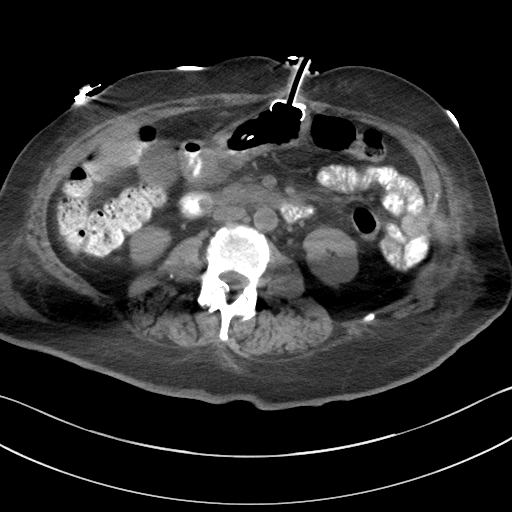
[im 56/88  bone]
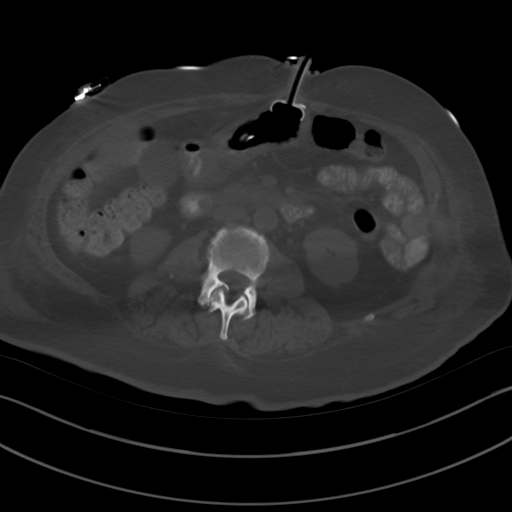
[im 63/88  soft-tissue]
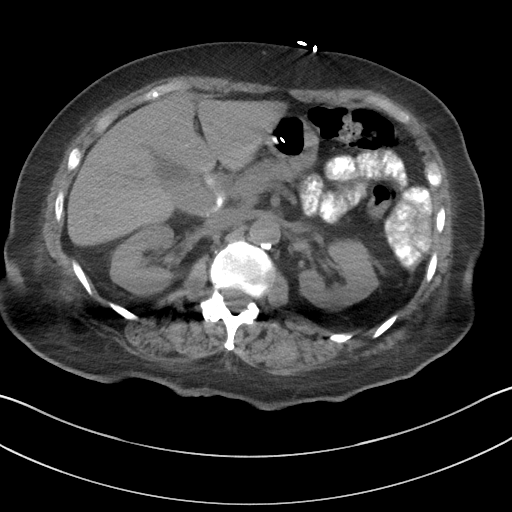
[im 70/88  soft-tissue]
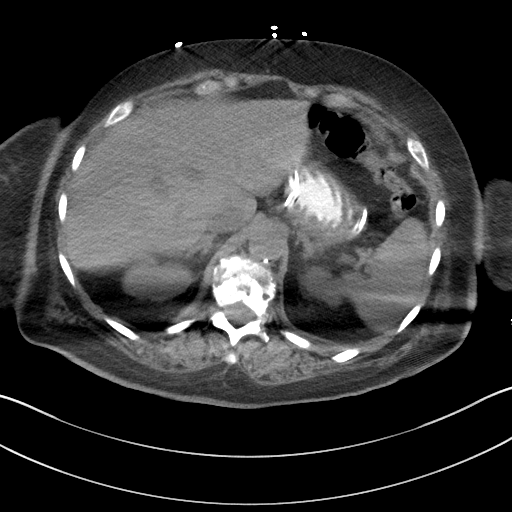
[im 77/88  soft-tissue]
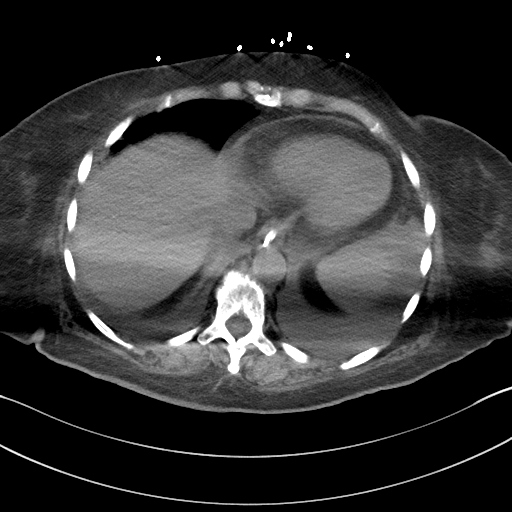
[im 84/88  soft-tissue]
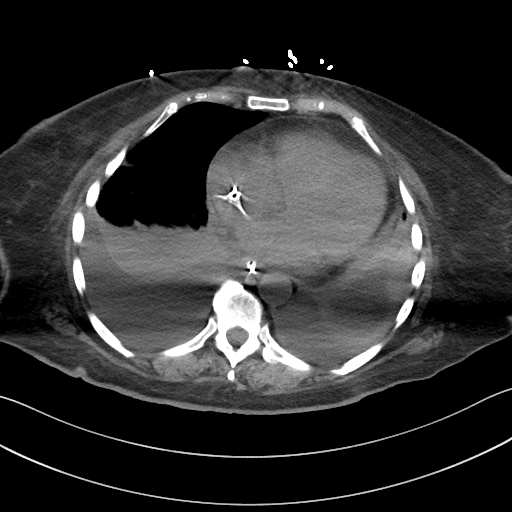

[Series 6: a/p w/o cor · coronal · non-contrast · 0.89mm/px · 3 of 132 slices shown]
[im 44/132  soft-tissue]
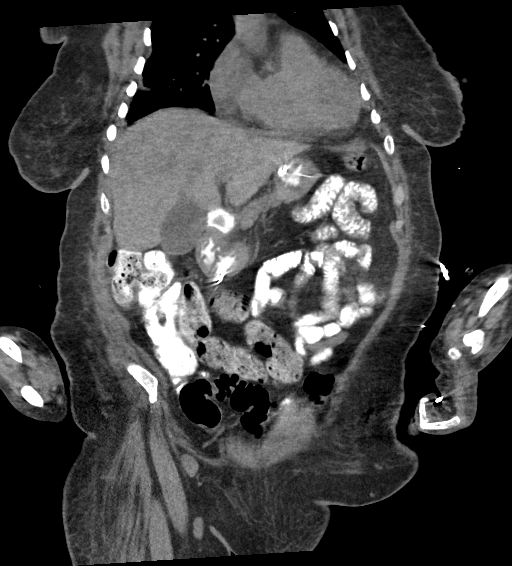
[im 59/132  soft-tissue]
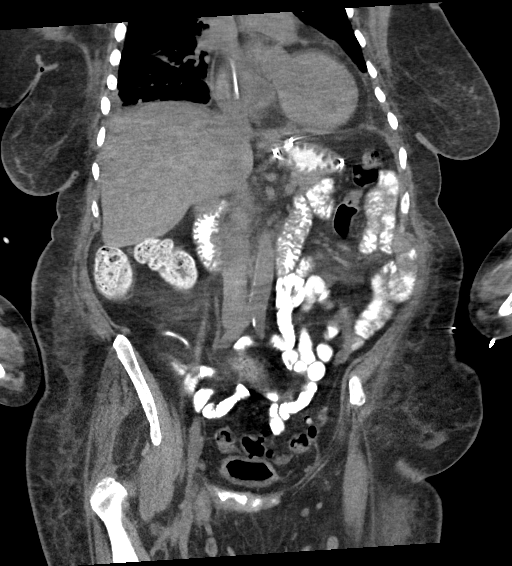
[im 73/132  soft-tissue]
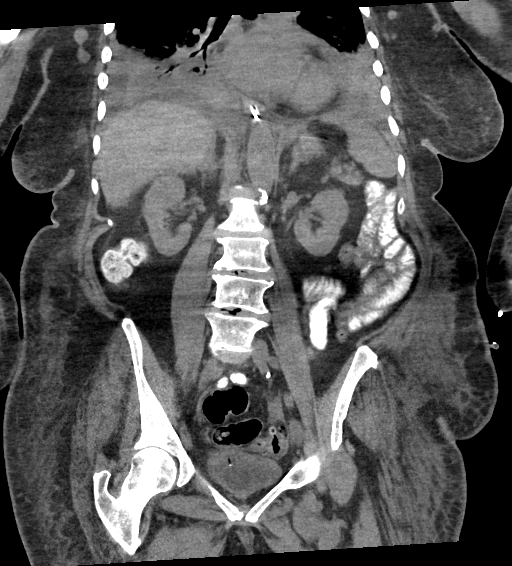

[16 of 46 positions shown; findings below may reference images not displayed]

FINDINGS: Lower chest: Moderate bilateral pleural effusions with compressive
lower lobe atelectasis. Central venous catheter at the atriocaval
junction.

Hepatobiliary: Unenhanced imaging of the liver and gallbladder
demonstrates no acute abnormalities. No biliary duct dilation.

Pancreas: Unremarkable. No pancreatic ductal dilatation or
surrounding inflammatory changes.

Spleen: Normal in size without focal abnormality.

Adrenals/Urinary Tract: Simple left renal cyst unchanged. No urinary
tract calculi or obstructive uropathy. The bladder is decompressed
with a Foley catheter in place. The adrenals are normal.

Stomach/Bowel: Enteric catheter extends into the gastric lumen, tip
in the region of the gastric antrum. There is a percutaneous
gastrostomy tube unchanged within the gastric lumen. No bowel
obstruction or ileus. Normal appendix right lower quadrant. No bowel
wall thickening or inflammatory change.

Vascular/Lymphatic: Aortic atherosclerosis. No enlarged abdominal or
pelvic lymph nodes.

Reproductive: Status post hysterectomy. No adnexal masses.

Other: No free fluid or free intraperitoneal gas. No abdominal wall
hernia.

Musculoskeletal: The discitis/osteomyelitis at the T12/L1 level is
again identified, with slight increase in the endplate resorption
and adjacent sclerosis since prior study. No evidence of paraspinal
fluid collection in this region.

There are no acute displaced fractures. Reconstructed images
demonstrate no additional findings.
IMPRESSION: 1. Slight progression of the discitis/osteomyelitis centered at the
T12/L1 level on previous examinations. No associated paraspinal
fluid collection or abscess.
2. Moderate bilateral pleural effusions with compressive
atelectasis.
3. Otherwise no acute intra-abdominal or intrapelvic process.
4. Support devices as above.
5.  Aortic Atherosclerosis ([BR]-[BR]).

## 2021-08-29 MED ORDER — VITAL 1.5 CAL PO LIQD
1000.0000 mL | ORAL | Status: DC
  Administered 2021-08-29 – 2021-09-01 (×4): 1000 mL
  Filled 2021-08-29 (×3): qty 1000

## 2021-08-29 MED ORDER — POLYETHYLENE GLYCOL 3350 17 G PO PACK
17.0000 g | PACK | Freq: Every day | ORAL | Status: DC | PRN
  Administered 2021-09-14: 17 g
  Filled 2021-08-29: qty 1

## 2021-08-29 MED ORDER — MUPIROCIN 2 % EX OINT
1.0000 "application " | TOPICAL_OINTMENT | Freq: Two times a day (BID) | CUTANEOUS | Status: AC
  Administered 2021-08-29 – 2021-09-02 (×10): 1 via NASAL
  Filled 2021-08-29: qty 22

## 2021-08-29 MED ORDER — DEXTROSE 50 % IV SOLN
25.0000 mL | Freq: Once | INTRAVENOUS | Status: AC
  Administered 2021-08-29: 25 mL via INTRAVENOUS
  Filled 2021-08-29: qty 50

## 2021-08-29 MED ORDER — IOHEXOL 9 MG/ML PO SOLN
ORAL | Status: AC
  Administered 2021-08-29: 500 mL
  Filled 2021-08-29: qty 1000

## 2021-08-29 MED ORDER — POTASSIUM CHLORIDE 20 MEQ PO PACK
40.0000 meq | PACK | Freq: Once | ORAL | Status: AC
  Administered 2021-08-29: 40 meq
  Filled 2021-08-29: qty 2

## 2021-08-29 MED ORDER — VANCOMYCIN HCL IN DEXTROSE 1-5 GM/200ML-% IV SOLN
1000.0000 mg | INTRAVENOUS | Status: AC
  Administered 2021-08-29 – 2021-09-01 (×4): 1000 mg via INTRAVENOUS
  Filled 2021-08-29 (×4): qty 200

## 2021-08-29 MED ORDER — PROSOURCE TF PO LIQD
45.0000 mL | Freq: Two times a day (BID) | ORAL | Status: DC
  Administered 2021-08-30 – 2021-09-02 (×8): 45 mL
  Filled 2021-08-29 (×8): qty 45

## 2021-08-29 MED ORDER — DOCUSATE SODIUM 50 MG/5ML PO LIQD
100.0000 mg | Freq: Two times a day (BID) | ORAL | Status: DC | PRN
  Administered 2021-09-15: 100 mg
  Filled 2021-08-29: qty 10

## 2021-08-29 MED ORDER — WHITE PETROLATUM EX OINT
TOPICAL_OINTMENT | CUTANEOUS | Status: AC
  Filled 2021-08-29: qty 28.35

## 2021-08-29 MED ORDER — DEXTROSE 50 % IV SOLN
INTRAVENOUS | Status: AC
  Filled 2021-08-29: qty 50

## 2021-08-29 MED ORDER — MAGNESIUM SULFATE 2 GM/50ML IV SOLN
2.0000 g | Freq: Once | INTRAVENOUS | Status: AC
  Administered 2021-08-29: 2 g via INTRAVENOUS
  Filled 2021-08-29: qty 50

## 2021-08-29 MED ORDER — DEXTROSE 50 % IV SOLN
12.5000 g | INTRAVENOUS | Status: AC
  Administered 2021-08-29: 12.5 g via INTRAVENOUS
  Filled 2021-08-29: qty 50

## 2021-08-29 MED ORDER — HYDROCERIN EX CREA
TOPICAL_CREAM | Freq: Two times a day (BID) | CUTANEOUS | Status: DC
  Administered 2021-09-06 – 2021-09-09 (×2): 1 via TOPICAL
  Administered 2021-09-12: 2 via TOPICAL
  Filled 2021-08-29 (×4): qty 113

## 2021-08-29 MED ORDER — DEXTROSE 50 % IV SOLN
12.5000 g | INTRAVENOUS | Status: AC
  Administered 2021-08-29: 12.5 g via INTRAVENOUS

## 2021-08-29 MED ORDER — DEXTROSE IN LACTATED RINGERS 5 % IV SOLN: INTRAVENOUS | Status: DC

## 2021-08-29 NOTE — Progress Notes (Signed)
Initial Nutrition Assessment  DOCUMENTATION CODES:   Severe malnutrition in context of acute illness/injury  INTERVENTION:   RD will monitor for ability to start enteral nutrition: - Recommend starting Vital 1.5 @ 20 ml/hr and advancing by 10 ml q 8 hours to goal rate of 50 ml/hr (1200 ml/day) - ProSource TF 45 ml BID  Recommended tube feeding regimen at goal would provide 1880 kcal, 103 grams of protein, and 917 ml of H2O.   NUTRITION DIAGNOSIS:   Severe Malnutrition related to acute illness (bacteremia) as evidenced by mild fat depletion, moderate muscle depletion, percent weight loss (11.7% weight loss in less than 2 months).  GOAL:   Patient will meet greater than or equal to 90% of their needs  MONITOR:   Vent status, Labs, Weight trends, Skin, I & O's  REASON FOR ASSESSMENT:   Ventilator, Consult Enteral/tube feeding initiation and management  ASSESSMENT:   75 year old female who presented to the ED from SNF on 10/19 with AMS. 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 Pt required intubation in the ED and suffered cardiac arrest x 2 in the ED. Pt admitted with septic shock, AKI.  Discussed pt with RN and during ICU rounds. Consult received for enteral nutrition initiation and management. Pt with both PEG and OG tube in place. Both of these are clamped. Pt complaining of abdominal pain. Discussed with RN and CCM. Plan is to obtain CT abdomen/pelvis and hold off on starting tube feeds at this time. Discussed with RN. Will leave tube feeding recommendations.  Noted pt on a diet at SNF and receiving nocturnal TF to supplement. Per review of home meds list, pt receiving Jevity 1.2 formula (1000 ml, no rate listed) at SNF. Pt not taking ProSource per home meds list.  Reviewed weight history in chart. Pt with a total weight loss of 10.5 kg since 07/10/21. This is an 11.7% weight loss in less than 2 months which is severe and  significant for timeframe. Pt meets criteria for malnutrition.  Patient is currently intubated on ventilator support MV: 9.2 L/min Temp (24hrs), Avg:95.4 F (35.2 C), Min:93.2 F (34 C), Max:96.3 F (35.7 C) BP (a-line): 124/51 MAP (a-line): 72  Drips: Fentanyl Levophed  Medications reviewed and include: IV protonix, IV abx  Labs reviewed: BUN 46, creatinine 1.26, lactic acid 2.0, hemoglobin 7.3, platelets 72 CBG's: 50-143 x 24 hours  UOP: 550 ml x 24 hours I/O's: +1.5 L since admit  NUTRITION - FOCUSED PHYSICAL EXAM:  Flowsheet Row Most Recent Value  Orbital Region Mild depletion  Upper Arm Region Mild depletion  Thoracic and Lumbar Region Mild depletion  Buccal Region Unable to assess  Temple Region Mild depletion  Clavicle Bone Region Mild depletion  Clavicle and Acromion Bone Region Mild depletion  Scapular Bone Region Unable to assess  Dorsal Hand Mild depletion  Patellar Region Moderate depletion  Anterior Thigh Region Moderate depletion  Posterior Calf Region Moderate depletion  Edema (RD Assessment) Mild  Hair Reviewed  Eyes Reviewed  Mouth Unable to assess  Skin Reviewed  Nails Reviewed       Diet Order:   Diet Order             Diet NPO time specified  Diet effective now                   EDUCATION NEEDS:   Not appropriate for education at this time  Skin:  Skin Assessment:  Skin Integrity Issues: Stage II: R coccyx, sacrum, R buttock Other: MASD to bialteral buttocks  Last BM:  no documented BM  Height:   Ht Readings from Last 1 Encounters:  08/28/21 5\' 6"  (1.676 m)    Weight:   Wt Readings from Last 1 Encounters:  08/29/21 79.4 kg    BMI:  Body mass index is 28.25 kg/m.  Estimated Nutritional Needs:   Kcal:  1700-1900  Protein:  90-110 grams  Fluid:  1.7-1.9 L    08/31/21, MS, RD, LDN Inpatient Clinical Dietitian Please see [facility] for contact information.

## 2021-08-29 NOTE — Progress Notes (Signed)
eLink Physician-Brief Progress Note Patient Name: Kristy [name] DOB: 1946-06-29 MRN: [id number]   Date of Service  08/29/2021  HPI/Events of Note  Bedside RN requesting order for AM CBC and BMP.  eICU Interventions  Labs ordered for the AM.        Kristy [name] 08/29/2021, 11:11 PM

## 2021-08-29 NOTE — Consult Note (Addendum)
[facility] for Infectious Disease    Date of Admission:  08/28/2021   Total days of antibiotics: 1vanco, unasyn               Reason for Consult: C2-T4 MSSA epidural abscess, Acinetobacter Bacteremia    Referring Provider: [place]   Assessment: MSSA cervical epidural abscess/fusion, osteomyelitis (T12/L1) Acinetobacter bacteremia CVA  Plan: Continue vanco/unasyn Await more information from BCx. WOC eval.  Repeat BCx Continue palliative care discussions   Comment- reviewed her CT neck, sequale of infection, no current abscess.  Await more info about her organisms: species, sensi.  I have not eval her wounds as she is currently intubated.   Thank you so much for this interesting consult,  Active Problems:   Sepsis ([place])    chlorhexidine gluconate (MEDLINE KIT)  15 mL Mouth Rinse BID   Chlorhexidine Gluconate Cloth  6 each Topical Daily   fentaNYL (SUBLIMAZE) injection  25 mcg Intravenous Once   heparin  5,000 Units Subcutaneous Q8H   mouth rinse  15 mL Mouth Rinse 10 times per day   mupirocin ointment  1 application Nasal BID   pantoprazole (PROTONIX) IV  40 mg Intravenous QHS    HPI: ZANDRA LAJEUNESSE is a 75 y.o. female with hx of cervical epidural abscess and surgical evacuation, dicsectomy, fusion first week of August 2022. She was treated with Nafcillin then ancef/rifampin with end date of 08-11-21. She was d/c to SNF 9-1 with Peg, foley.  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 Her anbx end date was changed to 08-25-21. She returned to SNF 9-27.  She returns 10-19 with acute respiratory failure requiring intbx in ED. She had cardiac arrest (epi, cardioversioin) x 2 in ED as well. She required pressors as well.  Her PIC is out (10-16). She remains hypothermic. She is alert.  BCx 10-19 2/2 Staph species, A baumanii  Review of  Systems: Review of Systems  Unable to perform ROS: Critical illness   No past medical history on file.  Social History   Tobacco Use   Smoking status: Unknown    No family history on file.   Medications: Scheduled:  chlorhexidine gluconate (MEDLINE KIT)  15 mL Mouth Rinse BID   Chlorhexidine Gluconate Cloth  6 each Topical Daily   fentaNYL (SUBLIMAZE) injection  25 mcg Intravenous Once   heparin  5,000 Units Subcutaneous Q8H   hydrocerin   Topical BID   mouth rinse  15 mL Mouth Rinse 10 times per day   mupirocin ointment  1 application Nasal BID   pantoprazole (PROTONIX) IV  40 mg Intravenous QHS    Abtx:  Anti-infectives (From admission, onward)    Start     Dose/Rate Route Frequency Ordered Stop   08/29/21 1200  vancomycin (VANCOREADY) IVPB 1250 mg/250 mL  Status:  Discontinued        1,250 mg 166.7 mL/hr over 90 Minutes Intravenous Every 24 hours 08/28/21 1113 08/29/21 0726   08/29/21 1200  vancomycin (VANCOCIN) IVPB 1000 mg/200 mL premix        1,000 mg 200 mL/hr over 60 Minutes Intravenous Every 24 hours 08/29/21 0726     08/28/21 2300  Ampicillin-Sulbactam (UNASYN) 3 g in sodium chloride 0.9 % 100 mL IVPB        3 g 200 mL/hr over 30 Minutes  Intravenous Every 6 hours 08/28/21 2200     08/28/21 1800  ceFEPIme (MAXIPIME) 2 g in sodium chloride 0.9 % 100 mL IVPB  Status:  Discontinued        2 g 200 mL/hr over 30 Minutes Intravenous Every 12 hours 08/28/21 1113 08/28/21 2200   08/28/21 1200  vancomycin (VANCOREADY) IVPB 1750 mg/350 mL        1,750 mg 175 mL/hr over 120 Minutes Intravenous  Once 08/28/21 1113 08/28/21 1348   08/28/21 0645  vancomycin (VANCOREADY) IVPB 1750 mg/350 mL  Status:  Discontinued        1,750 mg 175 mL/hr over 120 Minutes Intravenous  Once 08/28/21 0630 08/28/21 1139   08/28/21 0615  ceFEPIme (MAXIPIME) 2 g in sodium chloride 0.9 % 100 mL IVPB        2 g 200 mL/hr over 30 Minutes Intravenous  Once 08/28/21 0608 08/28/21 2045          OBJECTIVE: Blood pressure 92/83, pulse 60, temperature (!) 96.3 F (35.7 C), temperature source Bladder, resp. rate 20, height '5\' 6"'  (1.676 m), weight 79.4 kg, SpO2 100 %.  Physical Exam Vitals reviewed.  Eyes:     Comments: Disconjugate gaze  Neck:     Comments: R neck IJ, clean.  Cardiovascular:     Rate and Rhythm: Normal rate.  Pulmonary:     Effort: Pulmonary effort is normal.     Breath sounds: Normal breath sounds.  Abdominal:     General: Bowel sounds are normal.     Palpations: Abdomen is soft.     Tenderness: There is no abdominal tenderness.  Musculoskeletal:        General: Swelling present.     Right lower leg: No edema.     Left lower leg: Edema present.     Comments: Anasarca UE No cordis felt RUE at prev pic site.   Neurological:     Mental Status: She is alert.    Lab Results Results for orders placed or performed during the hospital encounter of 08/28/21 (from the past 48 hour(s))  Resp Panel by RT-PCR (Flu A&B, Covid) Nasopharyngeal Swab     Status: None   Collection Time: 08/28/21  6:08 AM   Specimen: Nasopharyngeal Swab; Nasopharyngeal(NP) swabs in vial transport medium  Result Value Ref Range   SARS Coronavirus 2 by RT PCR NEGATIVE NEGATIVE    Comment: (NOTE) SARS-CoV-2 target nucleic acids are NOT DETECTED.  The SARS-CoV-2 RNA is generally detectable in upper respiratory specimens during the acute phase of infection. The lowest concentration of SARS-CoV-2 viral copies this assay can detect is 138 copies/mL. A negative result does not preclude SARS-Cov-2 infection and should not be used as the sole basis for treatment or other patient management decisions. A negative result may occur with  improper specimen collection/handling, submission of specimen other than nasopharyngeal swab, presence of viral mutation(s) within the areas targeted by this assay, and inadequate number of viral copies(<138 copies/mL). A negative result must be  combined with clinical observations, patient history, and epidemiological information. The expected result is Negative.  Fact Sheet for Patients:  EntrepreneurPulse.com.au  Fact Sheet for Healthcare Providers:  IncredibleEmployment.be  This test is no t yet approved or cleared by the [place] FDA and  has been authorized for detection and/or diagnosis of SARS-CoV-2 by FDA under an Emergency Use Authorization (EUA). This EUA will remain  in effect (meaning this test can be used) for the duration of the COVID-19 declaration  under Section 564(b)(1) of the Act, 21 U.S.C.section 360bbb-3(b)(1), unless the authorization is terminated  or revoked sooner.       Influenza A by PCR NEGATIVE NEGATIVE   Influenza B by PCR NEGATIVE NEGATIVE    Comment: (NOTE) The Xpert Xpress SARS-CoV-2/FLU/RSV plus assay is intended as an aid in the diagnosis of influenza from Nasopharyngeal swab specimens and should not be used as a sole basis for treatment. Nasal washings and aspirates are unacceptable for Xpert Xpress SARS-CoV-2/FLU/RSV testing.  Fact Sheet for Patients: EntrepreneurPulse.com.au  Fact Sheet for Healthcare Providers: IncredibleEmployment.be  This test is not yet approved or cleared by the [place] FDA and has been authorized for detection and/or diagnosis of SARS-CoV-2 by FDA under an Emergency Use Authorization (EUA). This EUA will remain in effect (meaning this test can be used) for the duration of the COVID-19 declaration under Section 564(b)(1) of the Act, 21 U.S.C. section 360bbb-3(b)(1), unless the authorization is terminated or revoked.  Performed at [facility], [place] [address] [place], [place] [address]   Blood Culture (routine x 2)     Status: None (Preliminary result)   Collection Time: 08/28/21  6:13 AM   Specimen: BLOOD  Result Value Ref Range   Specimen Description BLOOD RIGHT  ANTECUBITAL    Special Requests      BOTTLES DRAWN AEROBIC AND ANAEROBIC Blood Culture adequate volume   Culture  Setup Time      GRAM NEGATIVE RODS AEROBIC BOTTLE ONLY GRAM POSITIVE COCCI CRITICAL RESULT CALLED TO, READ BACK BY AND VERIFIED WITH: PHARMD MICHAEL BITONTI 08/28/21'@21' :45 BY TW IN BOTH AEROBIC AND ANAEROBIC BOTTLES Performed at [facility], [place] [address] [place], [place] [address]    Culture PENDING    Report Status PENDING   Blood Culture ID Panel (Reflexed)     Status: Abnormal   Collection Time: 08/28/21  6:13 AM  Result Value Ref Range   Enterococcus faecalis NOT DETECTED NOT DETECTED   Enterococcus Faecium NOT DETECTED NOT DETECTED   Listeria monocytogenes NOT DETECTED NOT DETECTED   Staphylococcus species DETECTED (A) NOT DETECTED    Comment: RBV PHARMD MICHAEL BITONTI 08/28/21'@21' :45 BY TW    Staphylococcus aureus (BCID) NOT DETECTED NOT DETECTED   Staphylococcus epidermidis NOT DETECTED NOT DETECTED   Staphylococcus lugdunensis NOT DETECTED NOT DETECTED   Streptococcus species NOT DETECTED NOT DETECTED   Streptococcus agalactiae NOT DETECTED NOT DETECTED   Streptococcus pneumoniae NOT DETECTED NOT DETECTED   Streptococcus pyogenes NOT DETECTED NOT DETECTED   A.calcoaceticus-baumannii DETECTED (A) NOT DETECTED    Comment: CRITICAL RESULT CALLED TO, READ BACK BY AND VERIFIED WITH: PHARMD MICHAEL BITONTI 08/28/21'@21' :45 BY TW    Bacteroides fragilis NOT DETECTED NOT DETECTED   Enterobacterales NOT DETECTED NOT DETECTED   Enterobacter cloacae complex NOT DETECTED NOT DETECTED   Escherichia coli NOT DETECTED NOT DETECTED   Klebsiella aerogenes NOT DETECTED NOT DETECTED   Klebsiella oxytoca NOT DETECTED NOT DETECTED   Klebsiella pneumoniae NOT DETECTED NOT DETECTED   Proteus species NOT DETECTED NOT DETECTED   Salmonella species NOT DETECTED NOT DETECTED   Serratia marcescens NOT DETECTED NOT DETECTED   Haemophilus influenzae NOT DETECTED NOT  DETECTED   Neisseria meningitidis NOT DETECTED NOT DETECTED   Pseudomonas aeruginosa NOT DETECTED NOT DETECTED   Stenotrophomonas maltophilia NOT DETECTED NOT DETECTED   Candida albicans NOT DETECTED NOT DETECTED   Candida auris NOT DETECTED NOT DETECTED   Candida glabrata NOT DETECTED NOT DETECTED   Candida  krusei NOT DETECTED NOT DETECTED   Candida parapsilosis NOT DETECTED NOT DETECTED   Candida tropicalis NOT DETECTED NOT DETECTED   Cryptococcus neoformans/gattii NOT DETECTED NOT DETECTED   CTX-M ESBL NOT DETECTED NOT DETECTED   Carbapenem resistance IMP NOT DETECTED NOT DETECTED   Carbapenem resistance KPC NOT DETECTED NOT DETECTED   Carbapenem resistance NDM NOT DETECTED NOT DETECTED   Carbapenem resistance VIM NOT DETECTED NOT DETECTED    Comment: Performed at [facility], [place] [address] [place], [place] [address]  Lactic acid, plasma     Status: None   Collection Time: 08/28/21  6:47 AM  Result Value Ref Range   Lactic Acid, Venous 1.1 0.5 - 1.9 mmol/L    Comment: Performed at [facility] [address] [place], [place] [address]  Comprehensive metabolic panel     Status: Abnormal   Collection Time: 08/28/21  6:47 AM  Result Value Ref Range   Sodium 137 135 - 145 mmol/L   Potassium 5.7 (H) 3.5 - 5.1 mmol/L   Chloride 105 98 - 111 mmol/L   CO2 26 22 - 32 mmol/L   Glucose, Bld 86 70 - 99 mg/dL    Comment: Glucose reference range applies only to samples taken after fasting for at least 8 hours.   BUN 49 (H) 8 - 23 mg/dL   Creatinine, Ser 1.14 (H) 0.44 - 1.00 mg/dL   Calcium 9.5 8.9 - 10.3 mg/dL   Total Protein 7.3 6.5 - 8.1 g/dL   Albumin 2.6 (L) 3.5 - 5.0 g/dL   AST 23 15 - 41 U/L   ALT 5 0 - 44 U/L   Alkaline Phosphatase 108 38 - 126 U/L   Total Bilirubin 0.6 0.3 - 1.2 mg/dL   GFR, Estimated 50 (L) >60 mL/min    Comment: (NOTE) Calculated using the CKD-EPI Creatinine Equation (2021)    Anion gap 6 5 - 15    Comment: Performed at [facility] [address], [place], [place] [address]  CBC WITH DIFFERENTIAL     Status: Abnormal   Collection Time: 08/28/21  6:47 AM  Result Value Ref Range   WBC 6.7 4.0 - 10.5 K/uL    Comment: WHITE COUNT CONFIRMED ON SMEAR   RBC 2.77 (L) 3.87 - 5.11 MIL/uL   Hemoglobin 9.1 (L) 12.0 - 15.0 g/dL   HCT 31.3 (L) 36.0 - 46.0 %   MCV 113.0 (H) 80.0 - 100.0 fL   MCH 32.9 26.0 - 34.0 pg   MCHC 29.1 (L) 30.0 - 36.0 g/dL   RDW 18.2 (H) 11.5 - 15.5 %   Platelets 84 (L) 150 - 400 K/uL    Comment: Immature Platelet Fraction may be clinically indicated, consider ordering this additional test [id number] REPEATED TO VERIFY PLATELET COUNT CONFIRMED BY SMEAR    nRBC 0.6 (H) 0.0 - 0.2 %   Neutrophils Relative % 84 %   Neutro Abs 5.7 1.7 - 7.7 K/uL   Lymphocytes Relative 7 %   Lymphs Abs 0.5 (L) 0.7 - 4.0 K/uL   Monocytes Relative 6 %   Monocytes Absolute 0.4 0.1 - 1.0 K/uL   Eosinophils Relative 2 %   Eosinophils Absolute 0.1 0.0 - 0.5 K/uL   Basophils Relative 0 %   Basophils Absolute 0.0 0.0 - 0.1 K/uL   WBC Morphology MORPHOLOGY UNREMARKABLE    RBC Morphology MORPHOLOGY UNREMARKABLE    Smear Review PLATELETS APPEAR DECREASED    Immature Granulocytes 1 %  Abs Immature Granulocytes 0.06 0.00 - 0.07 K/uL   Giant PLTs PRESENT     Comment: Performed at [facility], [place] [address] [place], [place] [address]  Protime-INR     Status: None   Collection Time: 08/28/21  6:47 AM  Result Value Ref Range   Prothrombin Time 15.1 11.4 - 15.2 seconds   INR 1.2 0.8 - 1.2    Comment: (NOTE) INR goal varies based on device and disease states. Performed at [facility], [place] [address] [place], [place] [address]   APTT     Status: None   Collection Time: 08/28/21  6:47 AM  Result Value Ref Range   aPTT 36 24 - 36 seconds    Comment: Performed at [facility] [address] [place], [place] [address]  Troponin I (High Sensitivity)     Status: None   Collection Time: 08/28/21  6:47 AM   Result Value Ref Range   Troponin I (High Sensitivity) 11 <18 ng/L    Comment: (NOTE) Elevated high sensitivity troponin I (hsTnI) values and significant  changes across serial measurements may suggest ACS but many other  chronic and acute conditions are known to elevate hsTnI results.  Refer to the "Links" section for chest pain algorithms and additional  guidance. Performed at [facility], [place] [address] [place], [place] [address]   I-Stat arterial blood gas, [facility] ED)     Status: Abnormal   Collection Time: 08/28/21  8:06 AM  Result Value Ref Range   pH, Arterial 7.293 (L) 7.350 - 7.450   pCO2 arterial 45.7 32.0 - 48.0 mmHg   pO2, Arterial 446 (H) 83.0 - 108.0 mmHg   Bicarbonate 22.1 20.0 - 28.0 mmol/L   TCO2 24 22 - 32 mmol/L   O2 Saturation 100.0 %   Acid-base deficit 4.0 (H) 0.0 - 2.0 mmol/L   Sodium 144 135 - 145 mmol/L   Potassium 3.9 3.5 - 5.1 mmol/L   Calcium, Ion 1.16 1.15 - 1.40 mmol/L   HCT 25.0 (L) 36.0 - 46.0 %   Hemoglobin 8.5 (L) 12.0 - 15.0 g/dL   Sample type ARTERIAL   Lactic acid, plasma     Status: Abnormal   Collection Time: 08/28/21  8:35 AM  Result Value Ref Range   Lactic Acid, Venous 2.6 (HH) 0.5 - 1.9 mmol/L    Comment: CRITICAL RESULT CALLED TO, READ BACK BY AND VERIFIED WITH: DR. B. COE 6144 08/28/21 L. KLAR Performed at [facility], [place] [address] [place], [place] [address]   Ammonia     Status: None   Collection Time: 08/28/21  8:35 AM  Result Value Ref Range   Ammonia 11 9 - 35 umol/L    Comment: Performed at [facility], [place] [address] [place], [place] [address]  Troponin I (High Sensitivity)     Status: None   Collection Time: 08/28/21  8:35 AM  Result Value Ref Range   Troponin I (High Sensitivity) 15 <18 ng/L    Comment: (NOTE) Elevated high sensitivity troponin I (hsTnI) values and significant  changes across serial measurements may suggest ACS but many other  chronic and acute conditions are known to elevate  hsTnI results.  Refer to the "Links" section for chest pain algorithms and additional  guidance. Performed at [facility], [place] [address], [place], [place] [address]   Brain natriuretic peptide     Status: Abnormal  Collection Time: 08/28/21  8:35 AM  Result Value Ref Range   B Natriuretic Peptide 482.1 (H) 0.0 - 100.0 pg/mL    Comment: Performed at [facility] [address] [place], [place] [address]  CBC     Status: Abnormal   Collection Time: 08/28/21  8:35 AM  Result Value Ref Range   WBC 6.5 4.0 - 10.5 K/uL   RBC 2.68 (L) 3.87 - 5.11 MIL/uL   Hemoglobin 8.7 (L) 12.0 - 15.0 g/dL   HCT 30.0 (L) 36.0 - 46.0 %   MCV 111.9 (H) 80.0 - 100.0 fL   MCH 32.5 26.0 - 34.0 pg   MCHC 29.0 (L) 30.0 - 36.0 g/dL   RDW 18.0 (H) 11.5 - 15.5 %   Platelets 81 (L) 150 - 400 K/uL    Comment: Immature Platelet Fraction may be clinically indicated, consider ordering this additional test [id number] CONSISTENT WITH PREVIOUS RESULT REPEATED TO VERIFY    nRBC 0.9 (H) 0.0 - 0.2 %    Comment: Performed at [facility], [place] [address], [place], [place] [address]  Basic metabolic panel     Status: Abnormal   Collection Time: 08/28/21  8:35 AM  Result Value Ref Range   Sodium 140 135 - 145 mmol/L   Potassium 4.3 3.5 - 5.1 mmol/L   Chloride 110 98 - 111 mmol/L   CO2 22 22 - 32 mmol/L   Glucose, Bld 166 (H) 70 - 99 mg/dL    Comment: Glucose reference range applies only to samples taken after fasting for at least 8 hours.   BUN 46 (H) 8 - 23 mg/dL   Creatinine, Ser 1.11 (H) 0.44 - 1.00 mg/dL   Calcium 8.8 (L) 8.9 - 10.3 mg/dL   GFR, Estimated 52 (L) >60 mL/min    Comment: (NOTE) Calculated using the CKD-EPI Creatinine Equation (2021)    Anion gap 8 5 - 15    Comment: Performed at [facility] [address], [place], [place] [address]  Magnesium     Status: None   Collection Time: 08/28/21  8:35 AM  Result Value Ref Range   Magnesium 2.1 1.7 - 2.4 mg/dL    Comment:  Performed at [facility] [address], [place], [place] [address]  I-Stat venous blood gas, ED     Status: Abnormal   Collection Time: 08/28/21  9:11 AM  Result Value Ref Range   pH, Ven 7.300 7.250 - 7.430   pCO2, Ven 50.5 44.0 - 60.0 mmHg   pO2, Ven 168.0 (H) 32.0 - 45.0 mmHg   Bicarbonate 24.9 20.0 - 28.0 mmol/L   TCO2 26 22 - 32 mmol/L   O2 Saturation 99.0 %   Acid-base deficit 2.0 0.0 - 2.0 mmol/L   Sodium 143 135 - 145 mmol/L   Potassium 4.3 3.5 - 5.1 mmol/L   Calcium, Ion 1.21 1.15 - 1.40 mmol/L   HCT 28.0 (L) 36.0 - 46.0 %   Hemoglobin 9.5 (L) 12.0 - 15.0 g/dL   Sample type VENOUS   Glucose, capillary     Status: Abnormal   Collection Time: 08/28/21 10:10 AM  Result Value Ref Range   Glucose-Capillary 116 (H) 70 - 99 mg/dL    Comment: Glucose reference range applies only to samples taken after fasting for at least 8 hours.  Glucose, capillary     Status: Abnormal   Collection Time: 08/28/21 11:28 AM  Result Value Ref Range   Glucose-Capillary  121 (H) 70 - 99 mg/dL    Comment: Glucose reference range applies only to samples taken after fasting for at least 8 hours.  Urinalysis, Routine w reflex microscopic Urine, Catheterized     Status: Abnormal   Collection Time: 08/28/21 12:05 PM  Result Value Ref Range   Color, Urine YELLOW YELLOW   APPearance CLEAR CLEAR   Specific Gravity, Urine 1.020 1.005 - 1.030   pH 5.5 5.0 - 8.0   Glucose, UA NEGATIVE NEGATIVE mg/dL   Hgb urine dipstick SMALL (A) NEGATIVE   Bilirubin Urine NEGATIVE NEGATIVE   Ketones, ur NEGATIVE NEGATIVE mg/dL   Protein, ur NEGATIVE NEGATIVE mg/dL   Nitrite NEGATIVE NEGATIVE   Leukocytes,Ua SMALL (A) NEGATIVE    Comment: Performed at [facility] [address] [place], [place] [address]  Lactic acid, plasma     Status: Abnormal   Collection Time: 08/28/21 12:05 PM  Result Value Ref Range   Lactic Acid, Venous 2.0 (HH) 0.5 - 1.9 mmol/L    Comment: CRITICAL VALUE NOTED.  VALUE IS  CONSISTENT WITH PREVIOUSLY REPORTED AND CALLED VALUE. Performed at [facility], [place] [address], [place], [place] [address]   Urinalysis, Microscopic (reflex)     Status: Abnormal   Collection Time: 08/28/21 12:05 PM  Result Value Ref Range   RBC / HPF 0-5 0 - 5 RBC/hpf   WBC, UA 6-10 0 - 5 WBC/hpf   Bacteria, UA RARE (A) NONE SEEN   Squamous Epithelial / LPF 0-5 0 - 5   WBC Clumps PRESENT    Budding Yeast PRESENT    Hyaline Casts, UA PRESENT    Urine-Other MICROSCOPIC EXAM PERFORMED ON UNCONCENTRATED URINE     Comment: LESS THAN 10 mL OF URINE SUBMITTED Performed at [facility], [place] [address] [place], [place] [address]   .Cooxemetry Panel (carboxy, met, total hgb, O2 sat)     Status: Abnormal   Collection Time: 08/28/21 12:13 PM  Result Value Ref Range   Total hemoglobin 8.8 (L) 12.0 - 16.0 g/dL   O2 Saturation 83.8 %   Carboxyhemoglobin 1.7 (H) 0.5 - 1.5 %   Methemoglobin 0.8 0.0 - 1.5 %    Comment: Performed at [facility] [address], [place], [place] [address]  Glucose, capillary     Status: Abnormal   Collection Time: 08/28/21  3:42 PM  Result Value Ref Range   Glucose-Capillary 64 (L) 70 - 99 mg/dL    Comment: Glucose reference range applies only to samples taken after fasting for at least 8 hours.  Glucose, capillary     Status: Abnormal   Collection Time: 08/28/21  3:56 PM  Result Value Ref Range   Glucose-Capillary 54 (L) 70 - 99 mg/dL    Comment: Glucose reference range applies only to samples taken after fasting for at least 8 hours.  Glucose, capillary     Status: Abnormal   Collection Time: 08/28/21  4:31 PM  Result Value Ref Range   Glucose-Capillary 118 (H) 70 - 99 mg/dL    Comment: Glucose reference range applies only to samples taken after fasting for at least 8 hours.  Glucose, capillary     Status: Abnormal   Collection Time: 08/28/21  7:17 PM  Result Value Ref Range   Glucose-Capillary 52 (L) 70 - 99 mg/dL    Comment: Glucose  reference range applies only to samples taken after fasting for at least 8 hours.  Glucose, capillary  Status: Abnormal   Collection Time: 08/28/21  7:19 PM  Result Value Ref Range   Glucose-Capillary 50 (L) 70 - 99 mg/dL    Comment: Glucose reference range applies only to samples taken after fasting for at least 8 hours.  Glucose, capillary     Status: Abnormal   Collection Time: 08/28/21  8:05 PM  Result Value Ref Range   Glucose-Capillary 143 (H) 70 - 99 mg/dL    Comment: Glucose reference range applies only to samples taken after fasting for at least 8 hours.  Glucose, capillary     Status: Abnormal   Collection Time: 08/28/21 11:58 PM  Result Value Ref Range   Glucose-Capillary 58 (L) 70 - 99 mg/dL    Comment: Glucose reference range applies only to samples taken after fasting for at least 8 hours.  Glucose, capillary     Status: Abnormal   Collection Time: 08/29/21 12:29 AM  Result Value Ref Range   Glucose-Capillary 112 (H) 70 - 99 mg/dL    Comment: Glucose reference range applies only to samples taken after fasting for at least 8 hours.  Glucose, capillary     Status: Abnormal   Collection Time: 08/29/21  3:48 AM  Result Value Ref Range   Glucose-Capillary 56 (L) 70 - 99 mg/dL    Comment: Glucose reference range applies only to samples taken after fasting for at least 8 hours.  Triglycerides     Status: None   Collection Time: 08/29/21  4:09 AM  Result Value Ref Range   Triglycerides 91 <150 mg/dL    Comment: Performed at [facility] [address] [place], [place] [address]  CBC     Status: Abnormal   Collection Time: 08/29/21  4:09 AM  Result Value Ref Range   WBC 8.2 4.0 - 10.5 K/uL   RBC 2.27 (L) 3.87 - 5.11 MIL/uL   Hemoglobin 7.3 (L) 12.0 - 15.0 g/dL   HCT 24.6 (L) 36.0 - 46.0 %   MCV 108.4 (H) 80.0 - 100.0 fL   MCH 32.2 26.0 - 34.0 pg   MCHC 29.7 (L) 30.0 - 36.0 g/dL   RDW 18.5 (H) 11.5 - 15.5 %   Platelets 72 (L) 150 - 400 K/uL    Comment:  Immature Platelet Fraction may be clinically indicated, consider ordering this additional test [id number] CONSISTENT WITH PREVIOUS RESULT REPEATED TO VERIFY    nRBC 0.2 0.0 - 0.2 %    Comment: Performed at [facility], [place] [address] [place], [place] [address]  Basic metabolic panel     Status: Abnormal   Collection Time: 08/29/21  4:09 AM  Result Value Ref Range   Sodium 142 135 - 145 mmol/L   Potassium 3.7 3.5 - 5.1 mmol/L   Chloride 110 98 - 111 mmol/L   CO2 22 22 - 32 mmol/L   Glucose, Bld 128 (H) 70 - 99 mg/dL    Comment: Glucose reference range applies only to samples taken after fasting for at least 8 hours.   BUN 46 (H) 8 - 23 mg/dL   Creatinine, Ser 1.26 (H) 0.44 - 1.00 mg/dL   Calcium 9.1 8.9 - 10.3 mg/dL   GFR, Estimated 45 (L) >60 mL/min    Comment: (NOTE) Calculated using the CKD-EPI Creatinine Equation (2021)    Anion gap 10 5 - 15    Comment: Performed at [facility] [address], [place], [place] [address]  Magnesium     Status:  None   Collection Time: 08/29/21  4:09 AM  Result Value Ref Range   Magnesium 1.9 1.7 - 2.4 mg/dL    Comment: Performed at [facility], [place] [address], [place], [place] [address]  Phosphorus     Status: None   Collection Time: 08/29/21  4:09 AM  Result Value Ref Range   Phosphorus 2.7 2.5 - 4.6 mg/dL    Comment: Performed at [place] [address], [place], [place] [address]  Glucose, capillary     Status: Abnormal   Collection Time: 08/29/21  4:16 AM  Result Value Ref Range   Glucose-Capillary 122 (H) 70 - 99 mg/dL    Comment: Glucose reference range applies only to samples taken after fasting for at least 8 hours.  MRSA Next Gen by PCR, Nasal     Status: Abnormal   Collection Time: 08/29/21  4:21 AM   Specimen: Nasal Mucosa; Nasal Swab  Result Value Ref Range   MRSA by PCR Next Gen DETECTED (A) NOT DETECTED    Comment: RESULT CALLED TO, READ BACK BY AND VERIFIED WITH: RN ALEXA LEWIS  08/29/21'@5' :52 BY TW (NOTE) The GeneXpert MRSA Assay (FDA approved for NASAL specimens only), is one component of a comprehensive MRSA colonization surveillance program. It is not intended to diagnose MRSA infection nor to guide or monitor treatment for MRSA infections. Test performance is not FDA approved in patients less than 68 years old. Performed at [facility], [place] [address], [place], [place] [address]   Glucose, capillary     Status: Abnormal   Collection Time: 08/29/21  8:08 AM  Result Value Ref Range   Glucose-Capillary 57 (L) 70 - 99 mg/dL    Comment: Glucose reference range applies only to samples taken after fasting for at least 8 hours.      Component Value Date/Time   SDES BLOOD RIGHT ANTECUBITAL 08/28/2021 9629   SPECREQUEST  08/28/2021 5284    BOTTLES DRAWN AEROBIC AND ANAEROBIC Blood Culture adequate volume   CULT PENDING 08/28/2021 1324   REPTSTATUS PENDING 08/28/2021 4010   CT HEAD WO CONTRAST (5MM)  Result Date: 08/28/2021 CLINICAL DATA:  75 year old female with altered mental status, possible sepsis, status post cardiac arrest. 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 EXAM: CT HEAD WITHOUT CONTRAST TECHNIQUE: Contiguous axial images were obtained from the base of the skull through the vertex without intravenous contrast. COMPARISON:  Brain MRI 07/21/2021.  Head CT 07/20/2021 and earlier. FINDINGS: Brain: Hypodense left central pontine infarct is stable from last month. No associated hemorrhage or mass effect. Questionable subtle decreased gray-white matter differentiation in the cerebral hemispheres now (series 3, image 24), but no convincing loss of cerebral sulci. Stable ventricle size. No intracranial mass effect. No midline shift, ventriculomegaly, mass effect, evidence of mass lesion, intracranial hemorrhage or evidence of cortically based acute infarction. Vascular: Mild Calcified atherosclerosis at the skull base. No suspicious intracranial vascular  hyperdensity. Skull: Stable.  No acute osseous abnormality identified. Sinuses/Orbits: Visualized paranasal sinuses and mastoids are stable and well aerated. Other: Intubated and oral enteric tube in place with fluid in the pharynx. Disconjugate gaze. No acute scalp soft tissue finding. IMPRESSION: Difficult to exclude early loss of cerebral hemisphere gray-white differentiation, such as from anoxic injury in the setting of cardiac arrest. But otherwise stable non contrast CT appearance of the brain from last month. If the clinical course is equivocal then a repeat Head CT in 24 hours or follow-up Brain MRI would  should be confirmatory. Electronically Signed   By: Genevie Ann M.D.   On: 08/28/2021 10:07   CT Chest Wo Contrast  Result Date: 08/28/2021 CLINICAL DATA:  Chest pain, shortness of breath, post cardiac arrest EXAM: CT CHEST WITHOUT CONTRAST TECHNIQUE: Multidetector CT imaging of the chest was performed following the standard protocol without IV contrast. COMPARISON:  07/20/2021 FINDINGS: Cardiovascular: Right IJ central line placement to the mid right atrium. Heart size normal. Minimal pericardial fluid. Mediastinum/Nodes: Endotracheal tube terminates 2.5 cm above carina. Gastric tube extends at least as far as the stomach. No mediastinal hematoma, mass, or adenopathy. Prominent bilateral axillary nodes largest 1.1 cm short axis diameter on the left, stable since previous. Lungs/Pleura: Moderate right and smaller left pleural effusions, increased since previous. No pneumothorax. Scattered somewhat nodular airspace opacities centrally in both lower lobes and in the posterior aspect of the right upper lobe, new since previous. Upper Abdomen: No acute findings. Aortic Atherosclerosis (ICD10-170.0). Musculoskeletal: Cervical fixation hardware partially visualized. Anterior vertebral endplate spurring at multiple levels in the mid and lower thoracic spine. Left shoulder DJD with coracoclavicular mineralization.  No acute fracture or worrisome bone lesion. IMPRESSION: 1. Progressive pleural effusions right greater than left. 2. New patchy bilateral airspace opacities as above. 3. Endotracheal tube and gastric tube placement. Electronically Signed   By: Lucrezia Europe M.D.   On: 08/28/2021 10:04   CT CERVICAL SPINE WO CONTRAST  Result Date: 08/28/2021 CLINICAL DATA:  75 year old female with altered mental status, possible sepsis, status post cardiac arrest. 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 EXAM: CT CERVICAL SPINE WITHOUT CONTRAST TECHNIQUE: Multidetector CT imaging of the cervical spine was performed without intravenous contrast. Multiplanar CT image reconstructions were also generated. COMPARISON:  Cervical spine CT 06/11/2021. MRI 07/21/2021 and earlier. FINDINGS: Alignment: Improved cervical lordosis since August. Anterolisthesis of C7 on T1 is stable since August. Bilateral posterior element alignment is within normal limits. Skull base and vertebrae: Visualized skull base is intact. No atlanto-occipital dissociation. Congenital incomplete ossification of the posterior C1 ring. C1 and C2 appear intact and aligned. Bone mineralization in the cervical spine becomes abnormal at C5-C6, although sclerosis there might be postoperative (details below). But there is osteolysis of the C6 inferior endplate, also the C7 and T1 vertebral bodies since August in keeping with osteomyelitis. No C7 or T1 collapse at this time. No acute cervical spine fracture identified. Soft tissues and spinal canal: Intubated and oral enteric tube with fluid in the pharynx. Right IJ central line. Negative CT appearance of the cervical spinal canal. Disc levels: C5-C6 ACDF with no definite hardware loosening. Evidence of developing interbody arthrodesis (sagittal image 29). Widespread cervical facet arthropathy. Upper chest: In addition to the abnormal T1 vertebral body, the superior half of T2 also appears irregular and partially eroded now.  Partially visible layering pleural effusions in the lung apices. ET tube continues into the trachea. Enteric tube continues into the proximal esophagus. IMPRESSION: 1. Sequelae of C6 through T2 Discitis Osteomyelitis with vertebral endplate erosions since August. No vertebral body collapse at this time. 2. No superimposed acute cervical spine fracture identified. C5-C6 ACDF with intact hardware and evidence of developing interbody arthrodesis. 3. Intubated and oral enteric tube in place. Layering pleural effusions in the lung apices. Electronically Signed   By: Genevie Ann M.D.   On: 08/28/2021 10:13   DG Chest Portable 1 View  Result Date: 08/28/2021 CLINICAL DATA:  Intubation and central line placement EXAM: PORTABLE CHEST 1 VIEW COMPARISON:  Earlier same day  FINDINGS: Patient is rotated. New endotracheal tube is present and the proximally 2.5 cm above the carina. Right IJ central line overlies right atrium. Enteric tube passes below the diaphragm with tip out of field of view. No pneumothorax. Significantly improved aeration of the left lung. Persistent right pleural effusion and adjacent atelectasis with improved aeration at the right lung base. Stable cardiomediastinal contours. IMPRESSION: Lines and tubes as above.  No pneumothorax. Significantly improved aeration of the left lung. Persistent right pleural effusion and adjacent atelectasis with improved aeration at the right lung base. Electronically Signed   By: Macy Mis M.D.   On: 08/28/2021 08:54   DG Chest Port 1 View  Result Date: 08/28/2021 CLINICAL DATA:  Encounter for questionable sepsis, evaluate for abnormality EXAM: PORTABLE CHEST - 1 VIEW COMPARISON:  07/20/2021 FINDINGS: Interval development of large left pleural effusion with near complete consolidation/atelectasis at the left lung, only a small aerated segment of the upper lobe visible. There is some leftward mediastinal shift suggesting a degree of volume loss, raising concern of  central obstructing lesion. Smaller right pleural effusion with some atelectasis/consolidation in the right lower lung. Heart size difficult to assess due to adjacent opacities. No pneumothorax. Spinal fixation hardware partially visualized. No acute bone abnormalities noted. IMPRESSION: Near complete opacification of left hemithorax with volume loss, suggesting possible central obstructing lesion and effusion. Consider CT chest with contrast for further evaluation Electronically Signed   By: Lucrezia Europe M.D.   On: 08/28/2021 06:50   EEG adult  Result Date: 08/28/2021 Lora Havens, MD     08/28/2021  1:20 PM Patient Name: MAYERLI KIRST MRN: [id number] Epilepsy Attending: Lora Havens Referring Physician/Provider: Dr Marianna Payment Date: 08/28/2021 Duration: 38.13 mins Patient history: 75yo F with ams and cardiac arrest. EEG to evaluate for seizure. Level of alertness: comatose AEDs during EEG study: None Technical aspects: This EEG study was done with scalp electrodes positioned according to the 10-20 International system of electrode placement. Electrical activity was acquired at a sampling rate of '500Hz'  and reviewed with a high frequency filter of '70Hz'  and a low frequency filter of '1Hz' . EEG data were recorded continuously and digitally stored. Description: EEG showed continuous generalized 3 to 6 Hz theta-delta slowing. Hyperventilation and photic stimulation were not performed.   ABNORMALITY - Continuous slow, generalized IMPRESSION: This study is suggestive of severe diffuse encephalopathy, nonspecific etiology. No seizures or epileptiform discharges were seen throughout the recording. Lora Havens   ECHOCARDIOGRAM LIMITED  Result Date: 08/28/2021    ECHOCARDIOGRAM LIMITED REPORT   Patient Name:   KINSLIE HOVE Date of Exam: 08/28/2021 Medical Rec #:  [id number]     Height:       66.0 in Accession #:    [id number]    Weight:       185.5 lb Date of Birth:  29-Jun-1946      BSA:          1.937 m  Patient Age:    65 years      BP:           107/78 mmHg Patient Gender: F             HR:           70 bpm. Exam Location:  Inpatient Procedure: Limited Echo, Cardiac Doppler and Color Doppler Indications:    Cardiac arrest  History:        Patient has prior history of Echocardiogram examinations, most  recent 07/21/2021. Signs/Symptoms:Shortness of Breath. CKD.  Sonographer:    Clayton Lefort RDCS (AE) Referring Phys: [phone] [facility] D HARRIS  Sonographer Comments: Echo performed with patient supine and on artificial respirator. IMPRESSIONS  1. Left ventricular ejection fraction, by estimation, is 40 to 45%. The left ventricle has mildly decreased function. The left ventricle demonstrates regional wall motion abnormalities (see scoring diagram/findings for description). There is mild concentric left ventricular hypertrophy. There is severe hypokinesis/akinesis of the left ventricular, apical inferoseptal wall, inferior wall and apical segment.  2. Right ventricular systolic function is normal. The right ventricular size is normal.  3. Left atrial size was moderately dilated.  4. Mobile structure is seen, probably tip of a venous catheter.  5. Large pleural effusion in the left lateral region.  6. The mitral valve is normal in structure. Mild mitral valve regurgitation.  7. Tricuspid valve regurgitation is mild to moderate.  8. The aortic valve is tricuspid. Aortic valve regurgitation is mild to moderate. No aortic stenosis is present. Comparison(s): Prior images reviewed side by side. The left ventricular function is significantly worse. The left ventricular wall motion abnormality is new. FINDINGS  Left Ventricle: Left ventricular ejection fraction, by estimation, is 40 to 45%. The left ventricle has mildly decreased function. The left ventricle demonstrates regional wall motion abnormalities. Severe hypokinesis/akinesis of the left ventricular, apical inferoseptal wall, inferior wall and apical segment.  The left ventricular internal cavity size was normal in size. There is mild concentric left ventricular hypertrophy. Right Ventricle: The right ventricular size is normal. No increase in right ventricular wall thickness. Right ventricular systolic function is normal. Left Atrium: Left atrial size was moderately dilated. Right Atrium: Mobile structure is seen, probably tip of a venous catheter. Right atrial size was normal in size. Pericardium: There is no evidence of pericardial effusion. Mitral Valve: The mitral valve is normal in structure. Mild mitral valve regurgitation. Tricuspid Valve: The tricuspid valve is normal in structure. Tricuspid valve regurgitation is mild to moderate. Aortic Valve: The aortic valve is tricuspid. Aortic valve regurgitation is mild to moderate. No aortic stenosis is present. Venous: IVC assessment for right atrial pressure unable to be performed due to mechanical ventilation. Additional Comments: A venous catheter is visualized in the right atrium. There is a large pleural effusion in the left lateral region. LEFT VENTRICLE PLAX 2D LVIDd:         4.80 cm      Diastology LVIDs:         3.90 cm      LV e' medial:    6.31 cm/s LV PW:         1.20 cm      LV E/e' medial:  12.5 LV IVS:        1.30 cm      LV e' lateral:   5.77 cm/s LVOT diam:     2.10 cm      LV E/e' lateral: 13.6 LVOT Area:     3.46 cm  LV Volumes (MOD) LV vol d, MOD A2C: 99.6 ml LV vol d, MOD A4C: 107.0 ml LV vol s, MOD A2C: 53.4 ml LV vol s, MOD A4C: 68.2 ml LV SV MOD A2C:     46.2 ml LV SV MOD A4C:     107.0 ml LV SV MOD BP:      42.5 ml IVC IVC diam: 2.30 cm LEFT ATRIUM         Index LA diam:    4.40 cm 2.27  cm/m   AORTA Ao Root diam: 3.10 cm Ao Asc diam:  3.10 cm MITRAL VALVE               TRICUSPID VALVE MV Area (PHT): 3.17 cm    TR Peak grad:   18.8 mmHg MV Decel Time: 239 msec    TR Vmax:        217.00 cm/s MV E velocity: 78.60 cm/s MV A velocity: 89.70 cm/s  SHUNTS MV E/A ratio:  0.88        Systemic Diam:  2.10 cm Mihai Croitoru MD Electronically signed by Sanda Klein MD Signature Date/Time: 08/28/2021/5:43:16 PM    Final    Recent Results (from the past 240 hour(s))  Resp Panel by RT-PCR (Flu A&B, Covid) Nasopharyngeal Swab     Status: None   Collection Time: 08/28/21  6:08 AM   Specimen: Nasopharyngeal Swab; Nasopharyngeal(NP) swabs in vial transport medium  Result Value Ref Range Status   SARS Coronavirus 2 by RT PCR NEGATIVE NEGATIVE Final    Comment: (NOTE) SARS-CoV-2 target nucleic acids are NOT DETECTED.  The SARS-CoV-2 RNA is generally detectable in upper respiratory specimens during the acute phase of infection. The lowest concentration of SARS-CoV-2 viral copies this assay can detect is 138 copies/mL. A negative result does not preclude SARS-Cov-2 infection and should not be used as the sole basis for treatment or other patient management decisions. A negative result may occur with  improper specimen collection/handling, submission of specimen other than nasopharyngeal swab, presence of viral mutation(s) within the areas targeted by this assay, and inadequate number of viral copies(<138 copies/mL). A negative result must be combined with clinical observations, patient history, and epidemiological information. The expected result is Negative.  Fact Sheet for Patients:  EntrepreneurPulse.com.au  Fact Sheet for Healthcare Providers:  IncredibleEmployment.be  This test is no t yet approved or cleared by the [place] FDA and  has been authorized for detection and/or diagnosis of SARS-CoV-2 by FDA under an Emergency Use Authorization (EUA). This EUA will remain  in effect (meaning this test can be used) for the duration of the COVID-19 declaration under Section 564(b)(1) of the Act, 21 U.S.C.section 360bbb-3(b)(1), unless the authorization is terminated  or revoked sooner.       Influenza A by PCR NEGATIVE NEGATIVE Final   Influenza  B by PCR NEGATIVE NEGATIVE Final    Comment: (NOTE) The Xpert Xpress SARS-CoV-2/FLU/RSV plus assay is intended as an aid in the diagnosis of influenza from Nasopharyngeal swab specimens and should not be used as a sole basis for treatment. Nasal washings and aspirates are unacceptable for Xpert Xpress SARS-CoV-2/FLU/RSV testing.  Fact Sheet for Patients: EntrepreneurPulse.com.au  Fact Sheet for Healthcare Providers: IncredibleEmployment.be  This test is not yet approved or cleared by the [place] FDA and has been authorized for detection and/or diagnosis of SARS-CoV-2 by FDA under an Emergency Use Authorization (EUA). This EUA will remain in effect (meaning this test can be used) for the duration of the COVID-19 declaration under Section 564(b)(1) of the Act, 21 U.S.C. section 360bbb-3(b)(1), unless the authorization is terminated or revoked.  Performed at [facility], [place] [address] [place], [place] [address]   Blood Culture (routine x 2)     Status: None (Preliminary result)   Collection Time: 08/28/21  6:13 AM   Specimen: BLOOD  Result Value Ref Range Status   Specimen Description BLOOD RIGHT ANTECUBITAL  Final   Special Requests   Final  BOTTLES DRAWN AEROBIC AND ANAEROBIC Blood Culture adequate volume   Culture  Setup Time   Final    GRAM NEGATIVE RODS AEROBIC BOTTLE ONLY GRAM POSITIVE COCCI CRITICAL RESULT CALLED TO, READ BACK BY AND VERIFIED WITH: PHARMD MICHAEL BITONTI 08/28/21'@21' :45 BY TW IN BOTH AEROBIC AND ANAEROBIC BOTTLES Performed at [facility], [place] [address], [place], [place] [address]    Culture PENDING  Incomplete   Report Status PENDING  Incomplete  Blood Culture ID Panel (Reflexed)     Status: Abnormal   Collection Time: 08/28/21  6:13 AM  Result Value Ref Range Status   Enterococcus faecalis NOT DETECTED NOT DETECTED Final   Enterococcus Faecium NOT DETECTED NOT DETECTED Final   Listeria  monocytogenes NOT DETECTED NOT DETECTED Final   Staphylococcus species DETECTED (A) NOT DETECTED Final    Comment: RBV PHARMD MICHAEL BITONTI 08/28/21'@21' :45 BY TW    Staphylococcus aureus (BCID) NOT DETECTED NOT DETECTED Final   Staphylococcus epidermidis NOT DETECTED NOT DETECTED Final   Staphylococcus lugdunensis NOT DETECTED NOT DETECTED Final   Streptococcus species NOT DETECTED NOT DETECTED Final   Streptococcus agalactiae NOT DETECTED NOT DETECTED Final   Streptococcus pneumoniae NOT DETECTED NOT DETECTED Final   Streptococcus pyogenes NOT DETECTED NOT DETECTED Final   A.calcoaceticus-baumannii DETECTED (A) NOT DETECTED Final    Comment: CRITICAL RESULT CALLED TO, READ BACK BY AND VERIFIED WITH: PHARMD MICHAEL BITONTI 08/28/21'@21' :45 BY TW    Bacteroides fragilis NOT DETECTED NOT DETECTED Final   Enterobacterales NOT DETECTED NOT DETECTED Final   Enterobacter cloacae complex NOT DETECTED NOT DETECTED Final   Escherichia coli NOT DETECTED NOT DETECTED Final   Klebsiella aerogenes NOT DETECTED NOT DETECTED Final   Klebsiella oxytoca NOT DETECTED NOT DETECTED Final   Klebsiella pneumoniae NOT DETECTED NOT DETECTED Final   Proteus species NOT DETECTED NOT DETECTED Final   Salmonella species NOT DETECTED NOT DETECTED Final   Serratia marcescens NOT DETECTED NOT DETECTED Final   Haemophilus influenzae NOT DETECTED NOT DETECTED Final   Neisseria meningitidis NOT DETECTED NOT DETECTED Final   Pseudomonas aeruginosa NOT DETECTED NOT DETECTED Final   Stenotrophomonas maltophilia NOT DETECTED NOT DETECTED Final   Candida albicans NOT DETECTED NOT DETECTED Final   Candida auris NOT DETECTED NOT DETECTED Final   Candida glabrata NOT DETECTED NOT DETECTED Final   Candida krusei NOT DETECTED NOT DETECTED Final   Candida parapsilosis NOT DETECTED NOT DETECTED Final   Candida tropicalis NOT DETECTED NOT DETECTED Final   Cryptococcus neoformans/gattii NOT DETECTED NOT DETECTED Final   CTX-M  ESBL NOT DETECTED NOT DETECTED Final   Carbapenem resistance IMP NOT DETECTED NOT DETECTED Final   Carbapenem resistance KPC NOT DETECTED NOT DETECTED Final   Carbapenem resistance NDM NOT DETECTED NOT DETECTED Final   Carbapenem resistance VIM NOT DETECTED NOT DETECTED Final    Comment: Performed at [facility], [address] [place], [place] [address]  MRSA Next Gen by PCR, Nasal     Status: Abnormal   Collection Time: 08/29/21  4:21 AM   Specimen: Nasal Mucosa; Nasal Swab  Result Value Ref Range Status   MRSA by PCR Next Gen DETECTED (A) NOT DETECTED Final    Comment: RESULT CALLED TO, READ BACK BY AND VERIFIED WITH: RN ALEXA LEWIS 08/29/21'@5' :52 BY TW (NOTE) The GeneXpert MRSA Assay (FDA approved for NASAL specimens only), is one component of a comprehensive MRSA colonization surveillance program. It is not intended to diagnose MRSA infection nor to guide  or monitor treatment for MRSA infections. Test performance is not FDA approved in patients less than 91 years old. Performed at [facility], [place] [address], [place], [place] [address]     Microbiology: Recent Results (from the past 240 hour(s))  Resp Panel by RT-PCR (Flu A&B, Covid) Nasopharyngeal Swab     Status: None   Collection Time: 08/28/21  6:08 AM   Specimen: Nasopharyngeal Swab; Nasopharyngeal(NP) swabs in vial transport medium  Result Value Ref Range Status   SARS Coronavirus 2 by RT PCR NEGATIVE NEGATIVE Final    Comment: (NOTE) SARS-CoV-2 target nucleic acids are NOT DETECTED.  The SARS-CoV-2 RNA is generally detectable in upper respiratory specimens during the acute phase of infection. The lowest concentration of SARS-CoV-2 viral copies this assay can detect is 138 copies/mL. A negative result does not preclude SARS-Cov-2 infection and should not be used as the sole basis for treatment or other patient management decisions. A negative result may occur with  improper specimen  collection/handling, submission of specimen other than nasopharyngeal swab, presence of viral mutation(s) within the areas targeted by this assay, and inadequate number of viral copies(<138 copies/mL). A negative result must be combined with clinical observations, patient history, and epidemiological information. The expected result is Negative.  Fact Sheet for Patients:  EntrepreneurPulse.com.au  Fact Sheet for Healthcare Providers:  IncredibleEmployment.be  This test is no t yet approved or cleared by the [place] FDA and  has been authorized for detection and/or diagnosis of SARS-CoV-2 by FDA under an Emergency Use Authorization (EUA). This EUA will remain  in effect (meaning this test can be used) for the duration of the COVID-19 declaration under Section 564(b)(1) of the Act, 21 U.S.C.section 360bbb-3(b)(1), unless the authorization is terminated  or revoked sooner.       Influenza A by PCR NEGATIVE NEGATIVE Final   Influenza B by PCR NEGATIVE NEGATIVE Final    Comment: (NOTE) The Xpert Xpress SARS-CoV-2/FLU/RSV plus assay is intended as an aid in the diagnosis of influenza from Nasopharyngeal swab specimens and should not be used as a sole basis for treatment. Nasal washings and aspirates are unacceptable for Xpert Xpress SARS-CoV-2/FLU/RSV testing.  Fact Sheet for Patients: EntrepreneurPulse.com.au  Fact Sheet for Healthcare Providers: IncredibleEmployment.be  This test is not yet approved or cleared by the [place] FDA and has been authorized for detection and/or diagnosis of SARS-CoV-2 by FDA under an Emergency Use Authorization (EUA). This EUA will remain in effect (meaning this test can be used) for the duration of the COVID-19 declaration under Section 564(b)(1) of the Act, 21 U.S.C. section 360bbb-3(b)(1), unless the authorization is terminated or revoked.  Performed at [facility], [place] [address] [place], [place] [place] [id number]   Blood Culture (routine x 2)     Status: None (Preliminary result)   Collection Time: 08/28/21  6:13 AM   Specimen: BLOOD  Result Value Ref Range Status   Specimen Description BLOOD RIGHT ANTECUBITAL  Final   Special Requests   Final    BOTTLES DRAWN AEROBIC AND ANAEROBIC Blood Culture adequate volume   Culture  Setup Time   Final    GRAM NEGATIVE RODS AEROBIC BOTTLE ONLY GRAM POSITIVE COCCI CRITICAL RESULT CALLED TO, READ BACK BY AND VERIFIED WITH: PHARMD MICHAEL BITONTI 08/28/21'@21' :45 BY TW IN BOTH AEROBIC AND ANAEROBIC BOTTLES Performed at [facility], [place] [address] [place], [place] [address]    Culture PENDING  Incomplete   Report Status PENDING  Incomplete  Blood Culture ID Panel (Reflexed)     Status: Abnormal   Collection Time: 08/28/21  6:13 AM  Result Value Ref Range Status   Enterococcus faecalis NOT DETECTED NOT DETECTED Final   Enterococcus Faecium NOT DETECTED NOT DETECTED Final   Listeria monocytogenes NOT DETECTED NOT DETECTED Final   Staphylococcus species DETECTED (A) NOT DETECTED Final    Comment: RBV PHARMD MICHAEL BITONTI 08/28/21'@21' :45 BY TW    Staphylococcus aureus (BCID) NOT DETECTED NOT DETECTED Final   Staphylococcus epidermidis NOT DETECTED NOT DETECTED Final   Staphylococcus lugdunensis NOT DETECTED NOT DETECTED Final   Streptococcus species NOT DETECTED NOT DETECTED Final   Streptococcus agalactiae NOT DETECTED NOT DETECTED Final   Streptococcus pneumoniae NOT DETECTED NOT DETECTED Final   Streptococcus pyogenes NOT DETECTED NOT DETECTED Final   A.calcoaceticus-baumannii DETECTED (A) NOT DETECTED Final    Comment: CRITICAL RESULT CALLED TO, READ BACK BY AND VERIFIED WITH: PHARMD MICHAEL BITONTI 08/28/21'@21' :45 BY TW    Bacteroides fragilis NOT DETECTED NOT DETECTED Final   Enterobacterales NOT DETECTED NOT DETECTED Final   Enterobacter cloacae complex NOT DETECTED NOT DETECTED  Final   Escherichia coli NOT DETECTED NOT DETECTED Final   Klebsiella aerogenes NOT DETECTED NOT DETECTED Final   Klebsiella oxytoca NOT DETECTED NOT DETECTED Final   Klebsiella pneumoniae NOT DETECTED NOT DETECTED Final   Proteus species NOT DETECTED NOT DETECTED Final   Salmonella species NOT DETECTED NOT DETECTED Final   Serratia marcescens NOT DETECTED NOT DETECTED Final   Haemophilus influenzae NOT DETECTED NOT DETECTED Final   Neisseria meningitidis NOT DETECTED NOT DETECTED Final   Pseudomonas aeruginosa NOT DETECTED NOT DETECTED Final   Stenotrophomonas maltophilia NOT DETECTED NOT DETECTED Final   Candida albicans NOT DETECTED NOT DETECTED Final   Candida auris NOT DETECTED NOT DETECTED Final   Candida glabrata NOT DETECTED NOT DETECTED Final   Candida krusei NOT DETECTED NOT DETECTED Final   Candida parapsilosis NOT DETECTED NOT DETECTED Final   Candida tropicalis NOT DETECTED NOT DETECTED Final   Cryptococcus neoformans/gattii NOT DETECTED NOT DETECTED Final   CTX-M ESBL NOT DETECTED NOT DETECTED Final   Carbapenem resistance IMP NOT DETECTED NOT DETECTED Final   Carbapenem resistance KPC NOT DETECTED NOT DETECTED Final   Carbapenem resistance NDM NOT DETECTED NOT DETECTED Final   Carbapenem resistance VIM NOT DETECTED NOT DETECTED Final    Comment: Performed at [facility], [address], [place], [place] [address]  MRSA Next Gen by PCR, Nasal     Status: Abnormal   Collection Time: 08/29/21  4:21 AM   Specimen: Nasal Mucosa; Nasal Swab  Result Value Ref Range Status   MRSA by PCR Next Gen DETECTED (A) NOT DETECTED Final    Comment: RESULT CALLED TO, READ BACK BY AND VERIFIED WITH: RN ALEXA LEWIS 08/29/21'@5' :52 BY TW (NOTE) The GeneXpert MRSA Assay (FDA approved for NASAL specimens only), is one component of a comprehensive MRSA colonization surveillance program. It is not intended to diagnose MRSA infection nor to guide or monitor treatment for MRSA  infections. Test performance is not FDA approved in patients less than 16 years old. Performed at [facility], [place] [address] [place], [place] [address]     Radiographs and labs were personally reviewed by me.   Bobby Rumpf, MD [facility] [place] [facility] [phone] 08/29/2021, 8:54 AM

## 2021-08-29 NOTE — Progress Notes (Signed)
Patient transported to CT and back without complications.  

## 2021-08-29 NOTE — Progress Notes (Signed)
PHARMACY NOTE:  ANTIMICROBIAL RENAL DOSAGE ADJUSTMENT  Current antimicrobial regimen includes a mismatch between antimicrobial dosage and estimated renal function.  As per policy approved by the Pharmacy & Therapeutics and Medical Executive Committees, the antimicrobial dosage will be adjusted accordingly.  Current antimicrobial dosage:  Vancomycin 1250 mg IV every 24 hours (eAUC 537, SCr used 1.11)   Indication: sepsis   Renal Function: Estimated Creatinine Clearance: 41 mL/min (A) (by C-G formula based on SCr of 1.26 mg/dL (H)).    Antimicrobial dosage has been changed to: Vancomycin 1000mg  IV every 24 hours (eAUC 508, Scr used 1.26)  AUC goal 400-550  Thank you for allowing pharmacy to be a part of this patient's care.  , [facility] 08/29/2021 7:23 AM

## 2021-08-29 NOTE — Progress Notes (Addendum)
NAME:  Kristy [name], MRN:  [id number], DOB:  [birth year], LOS: 1 ADMISSION DATE:  08/28/2021, CONSULTATION DATE:  08/28/2021 REFERRING MD:  Dr. Sedonia Small, CHIEF COMPLAINT:  Cardiac arrest with sepsis    History of Present Illness:  Kristy [name] is a 75 y.o. 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 ED for mechanical fall x2 with severe right neck pain. Seen with elevated LFTs as well with reported ETOH use. Head and neck CT negative. Admitted to [facility] for deconditioning and acute encephalopathy  08/05 encephalopathy worsened and neuro and psychiatry was consulted. Psych was concerned for UTI for which UA was obtained and positive  08/07 MRI cervical, lumbar, and thoracic spine revealed discitis osteomyelitis at C5-6, C6-7, and possibly T1-2. 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   NSGY consulted and recommended surgical evacuation > C5-C6 Anterior Cervical Discectomy and Instrumented Fusion preformed later that day with large amount of pus seen  Treated with vancomycin and ceftriaxone  08/09 ABT changed to IV nafcillin for 8/7 epidural abscess cultures growing MSS ID discharge ABT plan: [place] dosed 8/07 - 06/30/21 followed by Cefazolin pharmacy dosed 8/22 - 08/11/2021 with Rifampin 600 mg BID to be continued throughout the treatment as she can tolerate  08/18 ENT consulted for right nostril epistaxis  08/23 PEG tube placed  09/01 discharged to SNF [facility]) in stable condition, had PEG tube in place for severe protein calorie malnutrition with high aspiration risk.  Also had indwelling Foley in place due to recurrent urinary retention. 09/10 presented back to [facility] emergency department due to altered mental status and concern for underlying infection.  Request per family made to transfer  to outside facility but no beds currently available.   CT/MRI imaging on admission revealed acute ischemic pontine infarct additionally concern for T12 and L1  discitis/osteomyelitis 9/11 Neurosurgery reconsulted and recommended against any further surgical interventions for persistent/progressive cervical and thoracic osteomyelitis 9/13:Repeat MRI brain w/ contrast:no intracranial focus of abnormal contrast 9/14 ID recommended to continue with cefazolin through 08/25/2021, outpatient ID follow-up.  Neurology and neurosurgery has signed off. 9/16 palliative care consulted full scope of treatment requested  9/27 discharged to SNF [facility]) ID  advised to continue Cefazolin through 08/25/2021 w/ PICC line-which will be inserted prior to discharge today.     Patient presented again to [facility] emergency department from local skilled nursing facility with complaints of shortness of breath.  Due to altered mental status and hypoxia patient intubated in ED.  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  Due to altered mental status and hypoxia patient intubated in ED.  Shortly after arrival patient cardiac arrested x2 with response to epi and cardioversion PCCM consulted for further management and admission  Interim History / Subjective:  Hypoglycemic overnight.  No other acute overnight events  Objective   Blood pressure 105/60, pulse 63, temperature (!) 96.1 F (35.6 C), resp. rate 20, height '5\' 6"'  (1.676 m), weight 79.4 kg, SpO2 100 %. CVP:  [10 mmHg-15 mmHg] 15 mmHg  Vent Mode: PRVC FiO2 (%):  [40 %-100 %] 40 % Set Rate:  [20 bmp] 20 bmp Vt Set:   [470 mL-4701 mL] 470 mL PEEP:  [10 QAS34-19 cmH20] 10 cmH20 Plateau Pressure:  [16 cmH20-29 cmH20] 24 cmH20   Intake/Output Summary (Last 24 hours) at 08/29/2021 0732 Last data filed at 08/29/2021 0700 Gross per 24 hour  Intake 1789.82 ml  Output 550 ml  Net 1239.82 ml   Filed Weights   08/28/21 0600 08/29/21 0256  Weight: 84.1 kg 79.4 kg    Examination: General: Acute on chronic deconditioned elderly female lying in bed unresponsive on ventilator HEENT: ETT, [place]/AT, PERRLA Neuro: Sedated, unresponsive, not following commands CV: RRR, no murmurs PULM: Bilateral rhonchi with thick secretions frothing from ET tube GI: soft, bowel sounds active in all 4 quadrants, non-tender, non-distended, PEG tube in place Extremities: warm/dry, generalized 2+ edema  Skin: no rashes or lesions  Labs- WBC 8.2 Hemoglobin 7.3 Platelets 72 Creatinine 1.26 Magnesium 1.9 MRSA positive  CT chest shows progressive pleural effusions right greater than left, new patchy bilateral airspace disease CT cervical spine shows sequelae of discitis/osteomyelitis with vertebral endplate erosions CT head shows a loss of cerebral gray-white differentiation, such as from anoxic injury in the setting of cardiac arrest but otherwise stable CT of the brain from last month Resolved Hospital Problem list     Assessment & Plan:  75 year old woman with recent metastatic MSSA infection presented from skilled nursing facility with shortness of breath, altered and subsequently intubated.  She was found to be hypotensive sustained cardiac arrest x2.  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  Presented with altered mental status and kidney dysfunction is endorgan damage.  Also with an elevated lactate requiring pressors.  She is a chronic PICC line and Foley catheter in place.  Blood  cultures showed Acalcoaceticus-baumannii.  Urine culture pending.   -Continue broad-spectrum antibiotics -Wean pressors for MAP goal greater than 65 -Follow sputum, urine and blood cultures -Infectious disease on board, appreciate recommendations -EEG shows no seizures or epileptiform discharges  Acute Hypoxic Respiratory Failure  Multifactorial related to toxic metabolic encephalopathy as well as pulmonary airspace disease.  Chest radiograph showed left upper lobe collapse and volume overload. -Continue broad-spectrum antibiotic -Daily SBT and SAT -VAP and PAD bundle -Consider diuresis once weaned from vasopressor support  Cardiac arrest x2 Patient suffered 2 brief cardiac arrest postintubation in ED.  It appeared to be PEA which responded to 2 minutes CPR 1 epi push.  After epi patient had V. tach which was cardioverted.echocardiogram shows EF of 40 to 45%, mildly decreased LV function and LV regional wall abnormalities.  Mild concentric left ventricular hypertrophy and severe hypokinesis/akinesis of the LV, apical inferoseptal wall and inferior wall and apical segment.  Right heart function is normal.   -Normothermia protocol -Continuous telemetry -Maintain K greater than 4, mag greater than 2   AKI Creatinine 1. 2 6, increased from 1.1 yesterday.  Baseline 0.8-0.9.  Suspect this is prerenal in the setting of septic shock and cardiac arrest.  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  Repeat cervical CT shows no acute signs of infection.  Follow clinical picture to determine need for neurosurgery/neurology consult.  Infectious disease consulted.  Thrombocytopenia Platelets 72.  -Trend CBC -Monitor for signs  of bleeding  Urinary retention requiring chronic indwelling Foley Patient presented with Foley from SNF.  UA unremarkable.  Foley  exchanged. - Follow urine culture.  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  Severe protein calorie malnutrition PEG placed 8/23. Daughter states that patein had been consuming some oral nutrition at the SNF with nocturnal TFs.  No signs of infection along the PEG tube.  Deconditioning  Supplement nutrition and start tube feeds   Best Practice (right click and "Reselect all SmartList Selections" daily)   Diet/type: NPO DVT prophylaxis: systemic heparin GI prophylaxis: PPI Lines: Central line Foley:  Yes, and it is still needed Code Status:  full code Last date of multidisciplinary goals of care discussion: 10/20  Labs   CBC: Recent Labs  Lab 08/28/21 0647 08/28/21 0806 08/28/21 0835 08/28/21 0911 08/29/21 0409  WBC 6.7  --  6.5  --  8.2  NEUTROABS 5.7  --   --   --   --   HGB 9.1* 8.5* 8.7* 9.5* 7.3*  HCT 31.3* 25.0* 30.0* 28.0* 24.6*  MCV 113.0*  --  111.9*  --  108.4*  PLT 84*  --  81*  --  72*    Basic Metabolic Panel: Recent Labs  Lab 08/28/21 0647 08/28/21 0806 08/28/21 0835 08/28/21 0911 08/29/21 0409  NA 137 144 140 143 142  K 5.7* 3.9 4.3 4.3 3.7  CL 105  --  110  --  110  CO2 26  --  22  --  22  GLUCOSE 86  --  166*  --  128*  BUN 49*  --  46*  --  46*  CREATININE 1.14*  --  1.11*  --  1.26*  CALCIUM 9.5  --  8.8*  --  9.1  MG  --   --  2.1  --  1.9  PHOS  --   --   --   --  2.7   GFR: Estimated Creatinine Clearance: 41 mL/min (A) (by C-G formula based on SCr of 1.26 mg/dL (H)). Recent Labs  Lab 08/28/21 0647 08/28/21 0835 08/28/21 1205 08/29/21 0409  WBC 6.7 6.5  --  8.2  LATICACIDVEN 1.1 2.6* 2.0*  --     Liver Function Tests: Recent Labs  Lab 08/28/21 0647  AST 23  ALT 5  ALKPHOS 108  BILITOT 0.6  PROT 7.3  ALBUMIN 2.6*   No results for input(s): LIPASE, AMYLASE in the last 168 hours. Recent Labs  Lab 08/28/21 0835   AMMONIA 11    ABG    Component Value Date/Time   PHART 7.293 (L) 08/28/2021 0806   PCO2ART 45.7 08/28/2021 0806   PO2ART 446 (H) 08/28/2021 0806   HCO3 24.9 08/28/2021 0911   TCO2 26 08/28/2021 0911   ACIDBASEDEF 2.0 08/28/2021 0911   O2SAT 83.8 08/28/2021 1213     Coagulation Profile: Recent Labs  Lab 08/28/21 0647  INR 1.2    Cardiac Enzymes: No results for input(s): CKTOTAL, CKMB, CKMBINDEX, TROPONINI in the last 168 hours.  HbA1C: Hgb A1c MFr Bld  Date/Time Value Ref Range Status  07/21/2021 05:00 AM 5.1 4.8 - 5.6 % Final    Comment:    (NOTE) Pre diabetes:          5.7%-6.4%  Diabetes:              >6.4%  Glycemic control for   <7.0% adults  with diabetes     CBG: Recent Labs  Lab 08/28/21 2005 08/28/21 2358 08/29/21 0029 08/29/21 0348 08/29/21 0416  GLUCAP 143* 58* 112* 56* 122*    Review of Systems:   Unable to assess   Past Medical History:  She,  has no past medical history on file.   Surgical History:   Past Surgical History:  Procedure Laterality Date   ANTERIOR CERVICAL DECOMP/DISCECTOMY FUSION N/A 06/16/2021   Procedure: ANTERIOR CERVICAL DISCECTOMY FUSION Cervical five -Cervical six, Evacuation of Epidural Abscess;  Surgeon: Judith Part, MD;  Location: [facility];  Service: Neurosurgery;  Laterality: N/A;   IR GASTROSTOMY TUBE MOD SED  07/02/2021     Social History:      Family History:  Her family history is not on file.   Allergies No Known Allergies   Home Medications  Prior to Admission medications   Medication Sig Start Date End Date Taking? Authorizing Provider  acetaminophen (TYLENOL) 500 MG tablet Place 1 tablet (500 mg total) into feeding tube every 6 (six) hours as needed for mild pain (or Fever >/= 101). 08/06/21   Antonieta Pert, MD  albuterol (PROVENTIL) (2.5 MG/3ML) 0.083% nebulizer solution Take 3 mLs (2.5 mg total) by nebulization every 6 (six) hours as needed for wheezing or shortness of breath. 08/06/21   Antonieta Pert, MD  allopurinol (ZYLOPRIM) 100 MG tablet Take 100 mg by mouth daily.    [provider]  aspirin 81 MG chewable tablet Place 1 tablet (81 mg total) into feeding tube daily. 08/07/21   Antonieta Pert, MD  atorvastatin (LIPITOR) 40 MG tablet Place 1 tablet (40 mg total) into feeding tube at bedtime. 08/06/21   Antonieta Pert, MD  bisacodyl (DULCOLAX) 10 MG suppository Place 10 mg rectally as needed for moderate constipation.    [provider]  ceFAZolin (ANCEF) IVPB Inject 2 g into the vein every 8 (eight) hours. Indication:  bacteremia First Dose: Yes Last Day of Therapy:  08/25/21 Labs - Once weekly:  CBC/D and BMP, Labs - Every other week:  ESR and CRP Method of administration: IV Push Method of administration may be changed at the discretion of home infusion pharmacist based upon assessment of the patient and/or caregiver's ability to self-administer the medication ordered. 08/06/21 09/07/21  Antonieta Pert, MD  diclofenac Sodium (VOLTAREN) 1 % GEL Apply 4 g topically 4 (four) times daily. To neck 07/10/21   Samella Parr, NP  Emollient (LUBRIDERM SERIOUSLY SENSITIVE) LOTN Apply 1 application topically 2 (two) times daily. 08/06/21   Antonieta Pert, MD  folic acid (FOLVITE) 1 MG tablet Place 1 tablet (1 mg total) into feeding tube daily. 07/11/21   Samella Parr, NP  hydrOXYzine (ATARAX/VISTARIL) 25 MG tablet Place 0.5 tablets (12.5 mg total) into feeding tube every 8 (eight) hours as needed for itching. 08/06/21   Antonieta Pert, MD  loperamide HCl (IMODIUM) 1 MG/7.5ML suspension Place 15 mLs (2 mg total) into feeding tube as needed for diarrhea or loose stools. 08/06/21   Antonieta Pert, MD  Magnesium Hydroxide (MILK OF MAGNESIA PO) Take 30 mLs by mouth daily as needed (constipation).    [provider]  melatonin 3 MG TABS tablet Take 1 tablet (3 mg total) by mouth at bedtime. 08/06/21   Antonieta Pert, MD  Multiple Vitamin (MULTIVITAMIN) LIQD Place 15 mLs into feeding tube daily. 07/11/21    Samella Parr, NP  niacin 500 MG tablet Place 1 tablet (500 mg total) into feeding tube  at bedtime. 07/10/21   Samella Parr, NP  Nutritional Supplements (FEEDING SUPPLEMENT, JEVITY 1.5 CAL/FIBER,) LIQD Place 1,000 mLs into feeding tube continuous. 08/06/21   Antonieta Pert, MD  Nutritional Supplements (FEEDING SUPPLEMENT, PROSOURCE TF,) liquid Place 45 mLs into feeding tube 3 (three) times daily. 08/06/21   Antonieta Pert, MD  OLANZapine (ZYPREXA) 2.5 MG tablet Place 1 tablet (2.5 mg total) into feeding tube daily. 08/07/21   Antonieta Pert, MD  OLANZapine (ZYPREXA) 5 MG tablet Place 1 tablet (5 mg total) into feeding tube at bedtime. 08/06/21   Antonieta Pert, MD  oxyCODONE (OXY IR/ROXICODONE) 5 MG immediate release tablet Take 1 tablet (5 mg total) by mouth 2 (two) times daily as needed for up to 4 doses for breakthrough pain or severe pain. 08/06/21   Antonieta Pert, MD  saccharomyces boulardii (FLORASTOR) 250 MG capsule Place 1 capsule (250 mg total) into feeding tube 2 (two) times daily. 08/06/21 09/05/21  Antonieta Pert, MD  thiamine 100 MG tablet Place 1 tablet (100 mg total) into feeding tube daily. 07/11/21   Samella Parr, NP  Water For Irrigation, Sterile (FREE WATER) SOLN Place 200 mLs into feeding tube every 4 (four) hours. 08/06/21   Antonieta Pert, MD     Corlette Ciano Laural Golden, DO PGY-3 IMTS

## 2021-08-29 NOTE — Progress Notes (Signed)
Hypoglycemic Event  CBG: 58  Treatment: D50 25 mL (12.5 gm)  Symptoms: None  Follow-up CBG: Time:0029 CBG Result:112  Possible Reasons for Event: Inadequate meal intake  Comments/MD notified:hypoglycemic protocol followed; Elink made aware of multiple hypoglycemic events     Kristy [name] Lawerance Cruel

## 2021-08-29 NOTE — Progress Notes (Signed)
Hypoglycemic Event  CBG: 56  Treatment: D50 25 mL (12.5 gm)  Symptoms: None  Follow-up CBG: Time:416 CBG Result:122  Possible Reasons for Event: Inadequate meal intake  Comments/MD notified: hypoglycemic protocol followed     Kirstin Kugler Lawerance Cruel

## 2021-08-30 DIAGNOSIS — E43 Unspecified severe protein-calorie malnutrition: Secondary | ICD-10-CM

## 2021-08-30 DIAGNOSIS — T17928A Food in respiratory tract, part unspecified causing other injury, initial encounter: Secondary | ICD-10-CM

## 2021-08-30 LAB — GLUCOSE, CAPILLARY
Glucose-Capillary: 83 mg/dL (ref 70–99)
Glucose-Capillary: 79 mg/dL (ref 70–99)
Glucose-Capillary: 79 mg/dL (ref 70–99)
Glucose-Capillary: 84 mg/dL (ref 70–99)
Glucose-Capillary: 67 mg/dL — ABNORMAL LOW (ref 70–99)
Glucose-Capillary: 121 mg/dL — ABNORMAL HIGH (ref 70–99)
Glucose-Capillary: 128 mg/dL — ABNORMAL HIGH (ref 70–99)
Glucose-Capillary: 63 mg/dL — ABNORMAL LOW (ref 70–99)

## 2021-08-30 LAB — CBC WITH DIFFERENTIAL/PLATELET
Abs Immature Granulocytes: 0.03 10*3/uL (ref 0.00–0.07)
Basophils Absolute: 0 10*3/uL (ref 0.0–0.1)
Basophils Relative: 0 %
Eosinophils Absolute: 1.2 10*3/uL — ABNORMAL HIGH (ref 0.0–0.5)
Eosinophils Relative: 17 %
HCT: 21.8 % — ABNORMAL LOW (ref 36.0–46.0)
Hemoglobin: 6.5 g/dL — CL (ref 12.0–15.0)
Immature Granulocytes: 0 %
Lymphocytes Relative: 12 %
Lymphs Abs: 0.8 10*3/uL (ref 0.7–4.0)
MCH: 32.5 pg (ref 26.0–34.0)
MCHC: 29.8 g/dL — ABNORMAL LOW (ref 30.0–36.0)
MCV: 109 fL — ABNORMAL HIGH (ref 80.0–100.0)
Monocytes Absolute: 0.8 10*3/uL (ref 0.1–1.0)
Monocytes Relative: 12 %
Neutro Abs: 4 10*3/uL (ref 1.7–7.7)
Neutrophils Relative %: 59 %
Platelets: 54 10*3/uL — ABNORMAL LOW (ref 150–400)
RBC: 2 MIL/uL — ABNORMAL LOW (ref 3.87–5.11)
RDW: 19 % — ABNORMAL HIGH (ref 11.5–15.5)
WBC: 6.9 10*3/uL (ref 4.0–10.5)
nRBC: 0 % (ref 0.0–0.2)

## 2021-08-30 LAB — BASIC METABOLIC PANEL
Anion gap: 5 (ref 5–15)
BUN: 36 mg/dL — ABNORMAL HIGH (ref 8–23)
CO2: 23 mmol/L (ref 22–32)
Calcium: 8.8 mg/dL — ABNORMAL LOW (ref 8.9–10.3)
Chloride: 112 mmol/L — ABNORMAL HIGH (ref 98–111)
Creatinine, Ser: 1.25 mg/dL — ABNORMAL HIGH (ref 0.44–1.00)
GFR, Estimated: 45 mL/min — ABNORMAL LOW (ref >60)
Glucose, Bld: 85 mg/dL (ref 70–99)
Potassium: 4.1 mmol/L (ref 3.5–5.1)
Sodium: 140 mmol/L (ref 135–145)

## 2021-08-30 LAB — HEMOGLOBIN AND HEMATOCRIT, BLOOD
HCT: 25.6 % — ABNORMAL LOW (ref 36.0–46.0)
Hemoglobin: 7.7 g/dL — ABNORMAL LOW (ref 12.0–15.0)

## 2021-08-30 LAB — PHOSPHORUS: Phosphorus: 3.1 mg/dL (ref 2.5–4.6)

## 2021-08-30 LAB — PREPARE RBC (CROSSMATCH)

## 2021-08-30 LAB — MAGNESIUM: Magnesium: 2.3 mg/dL (ref 1.7–2.4)

## 2021-08-30 MED ORDER — SODIUM CHLORIDE 0.9% IV SOLUTION: Freq: Once | INTRAVENOUS | Status: AC

## 2021-08-30 MED ORDER — DEXTROSE 50 % IV SOLN
INTRAVENOUS | Status: AC
  Administered 2021-08-30: 50 mL
  Filled 2021-08-30: qty 50

## 2021-08-30 MED ORDER — HYDROXYZINE HCL 10 MG PO TABS
10.0000 mg | ORAL_TABLET | Freq: Three times a day (TID) | ORAL | Status: DC | PRN
  Administered 2021-08-30 – 2021-09-04 (×9): 10 mg
  Filled 2021-08-30 (×10): qty 1

## 2021-08-30 MED ORDER — MELATONIN 3 MG PO TABS
3.0000 mg | ORAL_TABLET | Freq: Every evening | ORAL | Status: DC | PRN
  Administered 2021-08-30 – 2021-09-16 (×10): 3 mg
  Filled 2021-08-30 (×10): qty 1

## 2021-08-30 NOTE — Progress Notes (Addendum)
INFECTIOUS DISEASE PROGRESS NOTE  ID: Kristy [name] is a 75 y.o. female with  Active Problems:   Sepsis (Kristy [name])   Protein-calorie malnutrition, severe   Pressure injury of skin  Subjective: Daughter has multiple questions.   Abtx:  Anti-infectives (From admission, onward)    Start     Dose/Rate Route Frequency Ordered Stop   08/29/21 1200  vancomycin (VANCOREADY) IVPB 1250 mg/250 mL  Status:  Discontinued        1,250 mg 166.7 mL/hr over 90 Minutes Intravenous Every 24 hours 08/28/21 1113 08/29/21 0726   08/29/21 1200  vancomycin (VANCOCIN) IVPB 1000 mg/200 mL premix        1,000 mg 200 mL/hr over 60 Minutes Intravenous Every 24 hours 08/29/21 0726     08/28/21 2300  Ampicillin-Sulbactam (UNASYN) 3 g in sodium chloride 0.9 % 100 mL IVPB        3 g 200 mL/hr over 30 Minutes Intravenous Every 6 hours 08/28/21 2200     08/28/21 1800  ceFEPIme (MAXIPIME) 2 g in sodium chloride 0.9 % 100 mL IVPB  Status:  Discontinued        2 g 200 mL/hr over 30 Minutes Intravenous Every 12 hours 08/28/21 1113 08/28/21 2200   08/28/21 1200  vancomycin (VANCOREADY) IVPB 1750 mg/350 mL        1,750 mg 175 mL/hr over 120 Minutes Intravenous  Once 08/28/21 1113 08/28/21 1348   08/28/21 0645  vancomycin (VANCOREADY) IVPB 1750 mg/350 mL  Status:  Discontinued        1,750 mg 175 mL/hr over 120 Minutes Intravenous  Once 08/28/21 0630 08/28/21 1139   08/28/21 0615  ceFEPIme (MAXIPIME) 2 g in sodium chloride 0.9 % 100 mL IVPB        2 g 200 mL/hr over 30 Minutes Intravenous  Once 08/28/21 0608 08/28/21 2045       Medications: Scheduled:  sodium chloride   Intravenous Once   chlorhexidine gluconate (MEDLINE KIT)  15 mL Mouth Rinse BID   Chlorhexidine Gluconate Cloth  6 each Topical Daily   feeding supplement (PROSource TF)  45 mL Per Tube BID   fentaNYL (SUBLIMAZE) injection  25 mcg Intravenous Once   hydrocerin   Topical BID   mouth rinse  15 mL Mouth Rinse 10 times per day   mupirocin ointment   1 application Nasal BID   pantoprazole (PROTONIX) IV  40 mg Intravenous QHS    Objective: Vital signs in last 24 hours: Temp:  [95.9 F (35.5 C)-100 F (37.8 C)] 99.5 F (37.5 C) (10/21 0800) Pulse Rate:  [74-96] 90 (10/21 0800) Resp:  [0-33] 17 (10/21 0800) BP: (87-123)/(46-76) 117/63 (10/21 0800) SpO2:  [100 %] 100 % (10/21 0800) Arterial Line BP: (92-151)/(41-76) 145/53 (10/21 0800) FiO2 (%):  [40 %] 40 % (10/21 0800) Weight:  [81.9 kg] 81.9 kg (10/21 0230)   General appearance: opens eyes and appears to track Resp: rhonchi anterior - bilateral Cardio: regular rate and rhythm GI: normal findings: bowel sounds normal and soft, non-tender Extremities: dermatitis L foot  Lab Results Recent Labs    08/29/21 0409 08/30/21 0436  WBC 8.2 6.9  HGB 7.3* 6.5*  HCT 24.6* 21.8*  NA 142 140  K 3.7 4.1  CL 110 112*  CO2 22 23  BUN 46* 36*  CREATININE 1.26* 1.25*   Liver Panel Recent Labs    08/28/21 0647  PROT 7.3  ALBUMIN 2.6*  AST 23  ALT 5  ALKPHOS  108  BILITOT 0.6   Sedimentation Rate No results for input(s): ESRSEDRATE in the last 72 hours. C-Reactive Protein No results for input(s): CRP in the last 72 hours.  Microbiology: Recent Results (from the past 240 hour(s))  Resp Panel by RT-PCR (Flu A&B, Covid) Nasopharyngeal Swab     Status: None   Collection Time: 08/28/21  6:08 AM   Specimen: Nasopharyngeal Swab; Nasopharyngeal(NP) swabs in vial transport medium  Result Value Ref Range Status   SARS Coronavirus 2 by RT PCR NEGATIVE NEGATIVE Final    Comment: (NOTE) SARS-CoV-2 target nucleic acids are NOT DETECTED.  The SARS-CoV-2 RNA is generally detectable in upper respiratory specimens during the acute phase of infection. The lowest concentration of SARS-CoV-2 viral copies this assay can detect is 138 copies/mL. A negative result does not preclude SARS-Cov-2 infection and should not be used as the sole basis for treatment or other patient management  decisions. A negative result may occur with  improper specimen collection/handling, submission of specimen other than nasopharyngeal swab, presence of viral mutation(s) within the areas targeted by this assay, and inadequate number of viral copies(<138 copies/mL). A negative result must be combined with clinical observations, patient history, and epidemiological information. The expected result is Negative.  Fact Sheet for Patients:  EntrepreneurPulse.com.au  Fact Sheet for Healthcare Providers:  IncredibleEmployment.be  This test is no t yet approved or cleared by the [place] FDA and  has been authorized for detection and/or diagnosis of SARS-CoV-2 by FDA under an Emergency Use Authorization (EUA). This EUA will remain  in effect (meaning this test can be used) for the duration of the COVID-19 declaration under Section 564(b)(1) of the Act, 21 U.S.C.section 360bbb-3(b)(1), unless the authorization is terminated  or revoked sooner.       Influenza A by PCR NEGATIVE NEGATIVE Final   Influenza B by PCR NEGATIVE NEGATIVE Final    Comment: (NOTE) The Xpert Xpress SARS-CoV-2/FLU/RSV plus assay is intended as an aid in the diagnosis of influenza from Nasopharyngeal swab specimens and should not be used as a sole basis for treatment. Nasal washings and aspirates are unacceptable for Xpert Xpress SARS-CoV-2/FLU/RSV testing.  Fact Sheet for Patients: EntrepreneurPulse.com.au  Fact Sheet for Healthcare Providers: IncredibleEmployment.be  This test is not yet approved or cleared by the [place] FDA and has been authorized for detection and/or diagnosis of SARS-CoV-2 by FDA under an Emergency Use Authorization (EUA). This EUA will remain in effect (meaning this test can be used) for the duration of the COVID-19 declaration under Section 564(b)(1) of the Act, 21 U.S.C. section 360bbb-3(b)(1), unless the  authorization is terminated or revoked.  Performed at [facility], [place] [address] [place], [place] [address]   Blood Culture (routine x 2)     Status: Abnormal (Preliminary result)   Collection Time: 08/28/21  6:13 AM   Specimen: BLOOD  Result Value Ref Range Status   Specimen Description BLOOD RIGHT ANTECUBITAL  Final   Special Requests   Final    BOTTLES DRAWN AEROBIC AND ANAEROBIC Blood Culture adequate volume   Culture  Setup Time   Final    GRAM NEGATIVE RODS AEROBIC BOTTLE ONLY GRAM POSITIVE COCCI CRITICAL RESULT CALLED TO, READ BACK BY AND VERIFIED WITH: PHARMD Kristy [name] 08/28/21'@21' :30 BY TW IN BOTH AEROBIC AND ANAEROBIC BOTTLES    Culture (A)  Final    ACINETOBACTER BAUMANNII GRAM POSITIVE COCCI CULTURE REINCUBATED FOR BETTER GROWTH SUSCEPTIBILITIES TO FOLLOW Performed at [facility], [place]  [address] [place], [place] [address]    Report Status PENDING  Incomplete  Blood Culture ID Panel (Reflexed)     Status: Abnormal   Collection Time: 08/28/21  6:13 AM  Result Value Ref Range Status   Enterococcus faecalis NOT DETECTED NOT DETECTED Final   Enterococcus Faecium NOT DETECTED NOT DETECTED Final   Listeria monocytogenes NOT DETECTED NOT DETECTED Final   Staphylococcus species DETECTED (A) NOT DETECTED Final    Comment: RBV PHARMD Kristy [name] 08/28/21'@21' :45 BY TW    Staphylococcus aureus (BCID) NOT DETECTED NOT DETECTED Final   Staphylococcus epidermidis NOT DETECTED NOT DETECTED Final   Staphylococcus lugdunensis NOT DETECTED NOT DETECTED Final   Streptococcus species NOT DETECTED NOT DETECTED Final   Streptococcus agalactiae NOT DETECTED NOT DETECTED Final   Streptococcus pneumoniae NOT DETECTED NOT DETECTED Final   Streptococcus pyogenes NOT DETECTED NOT DETECTED Final   A.calcoaceticus-baumannii DETECTED (A) NOT DETECTED Final    Comment: CRITICAL RESULT CALLED TO, READ BACK BY AND VERIFIED WITH: PHARMD Kristy [name] 08/28/21'@21' :45  BY TW    Bacteroides fragilis NOT DETECTED NOT DETECTED Final   Enterobacterales NOT DETECTED NOT DETECTED Final   Enterobacter cloacae complex NOT DETECTED NOT DETECTED Final   Escherichia coli NOT DETECTED NOT DETECTED Final   Klebsiella aerogenes NOT DETECTED NOT DETECTED Final   Klebsiella oxytoca NOT DETECTED NOT DETECTED Final   Klebsiella pneumoniae NOT DETECTED NOT DETECTED Final   Proteus species NOT DETECTED NOT DETECTED Final   Salmonella species NOT DETECTED NOT DETECTED Final   Serratia marcescens NOT DETECTED NOT DETECTED Final   Haemophilus influenzae NOT DETECTED NOT DETECTED Final   Neisseria meningitidis NOT DETECTED NOT DETECTED Final   Pseudomonas aeruginosa NOT DETECTED NOT DETECTED Final   Stenotrophomonas maltophilia NOT DETECTED NOT DETECTED Final   Candida albicans NOT DETECTED NOT DETECTED Final   Candida auris NOT DETECTED NOT DETECTED Final   Candida glabrata NOT DETECTED NOT DETECTED Final   Candida krusei NOT DETECTED NOT DETECTED Final   Candida parapsilosis NOT DETECTED NOT DETECTED Final   Candida tropicalis NOT DETECTED NOT DETECTED Final   Cryptococcus neoformans/gattii NOT DETECTED NOT DETECTED Final   CTX-M ESBL NOT DETECTED NOT DETECTED Final   Carbapenem resistance IMP NOT DETECTED NOT DETECTED Final   Carbapenem resistance KPC NOT DETECTED NOT DETECTED Final   Carbapenem resistance NDM NOT DETECTED NOT DETECTED Final   Carbapenem resistance VIM NOT DETECTED NOT DETECTED Final    Comment: Performed at [facility], [place] [address] [place], [place] [address]  Blood Culture (routine x 2)     Status: None (Preliminary result)   Collection Time: 08/28/21  6:55 AM   Specimen: BLOOD LEFT WRIST  Result Value Ref Range Status   Specimen Description BLOOD LEFT WRIST  Final   Special Requests   Final    BOTTLES DRAWN AEROBIC AND ANAEROBIC Blood Culture results may not be optimal due to an inadequate volume of blood received in culture bottles    Culture   Final    NO GROWTH 1 DAY Performed at [facility], [place] [address] [place], [place] [address]    Report Status PENDING  Incomplete  Urine Culture     Status: Abnormal   Collection Time: 08/28/21 12:05 PM   Specimen: Urine, Clean Catch  Result Value Ref Range Status   Specimen Description URINE, CLEAN CATCH  Final   Special Requests   Final    NONE Performed at [facility], [address] [place], [place] [address]    Culture MULTIPLE SPECIES PRESENT, SUGGEST RECOLLECTION (A)  Final   Report Status 08/29/2021 FINAL  Final  MRSA Next Gen by PCR, Nasal     Status: Abnormal   Collection Time: 08/29/21  4:21 AM   Specimen: Nasal Mucosa; Nasal Swab  Result Value Ref Range Status   MRSA by PCR Next Gen DETECTED (A) NOT DETECTED Final    Comment: RESULT CALLED TO, READ BACK BY AND VERIFIED WITH: RN ALEXA LEWIS 08/29/21'@5' :52 BY TW (NOTE) The GeneXpert MRSA Assay (FDA approved for NASAL specimens only), is one component of a comprehensive MRSA colonization surveillance program. It is not intended to diagnose MRSA infection nor to guide or monitor treatment for MRSA infections. Test performance is not FDA approved in patients less than 1 years old. Performed at [facility], [place] [address], [place], [place] [address]     Studies/Results: CT ABDOMEN PELVIS WO CONTRAST  Result Date: 08/29/2021 CLINICAL DATA:  Abdominal pain EXAM: CT ABDOMEN AND PELVIS WITHOUT CONTRAST TECHNIQUE: Multidetector CT imaging of the abdomen and pelvis was performed following the standard protocol without IV contrast. COMPARISON:  07/20/2021, 06/27/2021 FINDINGS: Lower chest: Moderate bilateral pleural effusions with compressive lower lobe atelectasis. Central venous catheter at the atriocaval junction. Hepatobiliary: Unenhanced imaging of the liver and gallbladder demonstrates no acute abnormalities. No biliary duct dilation. Pancreas: Unremarkable. No pancreatic ductal dilatation  or surrounding inflammatory changes. Spleen: Normal in size without focal abnormality. Adrenals/Urinary Tract: Simple left renal cyst unchanged. No urinary tract calculi or obstructive uropathy. The bladder is decompressed with a Foley catheter in place. The adrenals are normal. Stomach/Bowel: Enteric catheter extends into the gastric lumen, tip in the region of the gastric antrum. There is a percutaneous gastrostomy tube unchanged within the gastric lumen. No bowel obstruction or ileus. Normal appendix right lower quadrant. No bowel wall thickening or inflammatory change. Vascular/Lymphatic: Aortic atherosclerosis. No enlarged abdominal or pelvic lymph nodes. Reproductive: Status post hysterectomy. No adnexal masses. Other: No free fluid or free intraperitoneal gas. No abdominal wall hernia. Musculoskeletal: The discitis/osteomyelitis at the T12/L1 level is again identified, with slight increase in the endplate resorption and adjacent sclerosis since prior study. No evidence of paraspinal fluid collection in this region. There are no acute displaced fractures. Reconstructed images demonstrate no additional findings. IMPRESSION: 1. Slight progression of the discitis/osteomyelitis centered at the T12/L1 level on previous examinations. No associated paraspinal fluid collection or abscess. 2. Moderate bilateral pleural effusions with compressive atelectasis. 3. Otherwise no acute intra-abdominal or intrapelvic process. 4. Support devices as above. 5.  Aortic Atherosclerosis (ICD10-I70.0). Electronically Signed   By: Kristy [name] M.D.   On: 08/29/2021 16:35   DG Abd Portable 1V  Result Date: 08/29/2021 CLINICAL DATA:  Encounter for orogastric (OG) tube placement Z46.59 (ICD-10-CM) EXAM: PORTABLE ABDOMEN - 1 VIEW COMPARISON:  Chest x-ray 08/28/2021 FINDINGS: Limited radiograph of the lower chest and upper abdomen was obtained for the purposes of enteric tube localization. Enteric tube is seen coursing below the  diaphragm with side port projecting within the expected location of the gastric body, and distal tip extending inferiorly beyond the field of view. Partially visualized right IJ central venous catheter, likely within the region of the right atrium. Hazy opacity at the right lung base, compatible with known layering pleural effusion. IMPRESSION: Enteric tube tip and side port project within the expected location of the gastric body. Electronically Signed   By: Kristy [name] D.O.  On: 08/29/2021 14:45   EEG adult  Result Date: 08/28/2021 Kristy Havens, MD     08/28/2021  1:20 PM Patient Name: Kristy [name] MRN: [id number] Epilepsy Attending: Lora [name] Referring Physician/Provider: Dr Marianna Payment Date: 08/28/2021 Duration: 38.13 mins Patient history: 75yo F with ams and cardiac arrest. EEG to evaluate for seizure. Level of alertness: comatose AEDs during EEG study: None Technical aspects: This EEG study was done with scalp electrodes positioned according to the 10-20 International system of electrode placement. Electrical activity was acquired at a sampling rate of '500Hz'  and reviewed with a high frequency filter of '70Hz'  and a low frequency filter of '1Hz' . EEG data were recorded continuously and digitally stored. Description: EEG showed continuous generalized 3 to 6 Hz theta-delta slowing. Hyperventilation and photic stimulation were not performed.   ABNORMALITY - Continuous slow, generalized IMPRESSION: This study is suggestive of severe diffuse encephalopathy, nonspecific etiology. No seizures or epileptiform discharges were seen throughout the recording. Kristy [name]   ECHOCARDIOGRAM LIMITED  Result Date: 08/28/2021    ECHOCARDIOGRAM LIMITED REPORT   Patient Name:   Kristy [name] Date of Exam: 08/28/2021 Medical Rec #:  [id number]     Height:       66.0 in Accession #:    [id number]    Weight:       185.5 lb Date of Birth:  [birth year]      BSA:          1.937 m Patient Age:    59 years       BP:           107/78 mmHg Patient Gender: F             HR:           70 bpm. Exam Location:  Inpatient Procedure: Limited Echo, Cardiac Doppler and Color Doppler Indications:    Cardiac arrest  History:        Patient has prior history of Echocardiogram examinations, most                 recent 07/21/2021. Signs/Symptoms:Shortness of Breath. CKD.  Sonographer:    Clayton Lefort RDCS (AE) Referring Phys: [id number] [facility] D HARRIS  Sonographer Comments: Echo performed with patient supine and on artificial respirator. IMPRESSIONS  1. Left ventricular ejection fraction, by estimation, is 40 to 45%. The left ventricle has mildly decreased function. The left ventricle demonstrates regional wall motion abnormalities (see scoring diagram/findings for description). There is mild concentric left ventricular hypertrophy. There is severe hypokinesis/akinesis of the left ventricular, apical inferoseptal wall, inferior wall and apical segment.  2. Right ventricular systolic function is normal. The right ventricular size is normal.  3. Left atrial size was moderately dilated.  4. Mobile structure is seen, probably tip of a venous catheter.  5. Large pleural effusion in the left lateral region.  6. The mitral valve is normal in structure. Mild mitral valve regurgitation.  7. Tricuspid valve regurgitation is mild to moderate.  8. The aortic valve is tricuspid. Aortic valve regurgitation is mild to moderate. No aortic stenosis is present. Comparison(s): Prior images reviewed side by side. The left ventricular function is significantly worse. The left ventricular wall motion abnormality is new. FINDINGS  Left Ventricle: Left ventricular ejection fraction, by estimation, is 40 to 45%. The left ventricle has mildly decreased function. The left ventricle demonstrates regional wall motion abnormalities. Severe hypokinesis/akinesis of the left ventricular, apical inferoseptal wall, inferior wall and apical segment.  The left ventricular internal  cavity size was normal in size. There is mild concentric left ventricular hypertrophy. Right Ventricle: The right ventricular size is normal. No increase in right ventricular wall thickness. Right ventricular systolic function is normal. Left Atrium: Left atrial size was moderately dilated. Right Atrium: Mobile structure is seen, probably tip of a venous catheter. Right atrial size was normal in size. Pericardium: There is no evidence of pericardial effusion. Mitral Valve: The mitral valve is normal in structure. Mild mitral valve regurgitation. Tricuspid Valve: The tricuspid valve is normal in structure. Tricuspid valve regurgitation is mild to moderate. Aortic Valve: The aortic valve is tricuspid. Aortic valve regurgitation is mild to moderate. No aortic stenosis is present. Venous: IVC assessment for right atrial pressure unable to be performed due to mechanical ventilation. Additional Comments: A venous catheter is visualized in the right atrium. There is a large pleural effusion in the left lateral region. LEFT VENTRICLE PLAX 2D LVIDd:         4.80 cm      Diastology LVIDs:         3.90 cm      LV e' medial:    6.31 cm/s LV PW:         1.20 cm      LV E/e' medial:  12.5 LV IVS:        1.30 cm      LV e' lateral:   5.77 cm/s LVOT diam:     2.10 cm      LV E/e' lateral: 13.6 LVOT Area:     3.46 cm  LV Volumes (MOD) LV vol d, MOD A2C: 99.6 ml LV vol d, MOD A4C: 107.0 ml LV vol s, MOD A2C: 53.4 ml LV vol s, MOD A4C: 68.2 ml LV SV MOD A2C:     46.2 ml LV SV MOD A4C:     107.0 ml LV SV MOD BP:      42.5 ml IVC IVC diam: 2.30 cm LEFT ATRIUM         Index LA diam:    4.40 cm 2.27 cm/m   AORTA Ao Root diam: 3.10 cm Ao Asc diam:  3.10 cm MITRAL VALVE               TRICUSPID VALVE MV Area (PHT): 3.17 cm    TR Peak grad:   18.8 mmHg MV Decel Time: 239 msec    TR Vmax:        217.00 cm/s MV E velocity: 78.60 cm/s MV A velocity: 89.70 cm/s  SHUNTS MV E/A ratio:  0.88        Systemic Diam: 2.10 cm Mihai Croitoru MD  Electronically signed by Sanda Klein MD Signature Date/Time: 08/28/2021/5:43:16 PM    Final      Assessment/Plan: Cardiac arrest x 2 VDRF Thrombocytopenia, Anemia MSSA cervical epidural abscess/fusion, osteomyelitis (T12/L1) Acinetobacter bacteremia CVA Protein Calorie Malnutrition  Total days of antibiotics: 2 vanco/unasyn  Repeat BCx 10-20 pending.  Await sensi on the acinetobacter.  No change in anbx Agree with palliative care eval (Placed).  Dr Juleen China available if questions over w/e.          Bobby Rumpf MD, FACP Infectious Diseases (pager) [phone] [email or web address] 08/30/2021, 10:18 AM  LOS: 2 days

## 2021-08-30 NOTE — Progress Notes (Signed)
eLink Physician-Brief Progress Note Patient Name: Kristy [name] DOB: [birth year] MRN: [id number]   Date of Service  08/30/2021  HPI/Events of Note  Multiple issues: 1. Patient c/o itching. Takes Atarax at home. 2. Nursing request for sleep aid.  eICU Interventions  Plan: Atarax 10 mg per tube now and TID PRN itching.  Melatonin 3 mg per tube Q HS PRN sleep.     Intervention Category Major Interventions: Other:  Lenell Antu 08/30/2021, 8:46 PM

## 2021-08-30 NOTE — Progress Notes (Signed)
NAME:  Kristy [name], MRN:  [id number], DOB:  [birth year], LOS: 2 ADMISSION DATE:  08/28/2021, CONSULTATION DATE:  08/28/2021 REFERRING MD:  Dr. Sedonia Small, CHIEF COMPLAINT:  Cardiac arrest with sepsis    History of Present Illness:  Kristy [name] is a 75 y.o. 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 Seen with elevated LFTs as well with reported ETOH use. Head and neck CT negative. Admitted to [facility] for deconditioning and acute encephalopathy  08/05 encephalopathy worsened and neuro and psychiatry was consulted. Psych was concerned for UTI for which UA was obtained and positive  08/07 MRI cervical, lumbar, and thoracic spine revealed discitis osteomyelitis at C5-6, C6-7, and possibly T1-2. 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   NSGY consulted and recommended surgical evacuation > C5-C6 Anterior Cervical Discectomy and Instrumented Fusion preformed later that day with large amount of pus seen  Treated with vancomycin and ceftriaxone  08/09 ABT changed to IV nafcillin for 8/7 epidural abscess cultures growing MSS ID discharge ABT plan: [place] dosed 8/07 - 06/30/21 followed by Cefazolin pharmacy dosed 8/22 - 08/11/2021 with Rifampin 600 mg BID to be continued throughout the treatment as she can tolerate  08/18 ENT consulted for right nostril epistaxis  08/23 PEG tube placed  09/01 discharged to SNF [facility]) in stable condition, had PEG tube in place for severe protein calorie malnutrition with high aspiration risk.  Also had indwelling Foley in place due to recurrent urinary retention. 09/10 presented back to [facility] emergency department due to altered mental status and concern for underlying infection.  Request per family made to transfer  to outside facility but no beds currently available.   CT/MRI imaging on admission revealed acute ischemic pontine infarct additionally concern for T12 and L1  discitis/osteomyelitis 9/11 Neurosurgery reconsulted and recommended against any further surgical interventions for persistent/progressive cervical and thoracic osteomyelitis 9/13:Repeat MRI brain w/ contrast:no intracranial focus of abnormal contrast 9/14 ID recommended to continue with cefazolin through 08/25/2021, outpatient ID follow-up.  Neurology and neurosurgery has signed off. 9/16 palliative care consulted full scope of treatment requested  9/27 discharged to SNF [facility]) ID  advised to continue Cefazolin through 08/25/2021 w/ PICC line-which will be inserted prior to discharge today.     Patient presented again to [facility] emergency department from local skilled nursing facility with complaints of shortness of breath.  Due to altered mental status and hypoxia patient intubated in ED.  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  Due to altered mental status and hypoxia patient intubated in ED.  Shortly after arrival patient cardiac arrested x2 with response to epi and cardioversion PCCM consulted for further management and admission  Interim History / Subjective:  Overnight patient's Hgb 6.5, transfused 1 unit PRBC. No other events. She is weak on exam today, minimally following commands.  Objective   Blood pressure (!) 97/54, pulse 90, temperature 99.7 F (37.6 C), temperature source Bladder, resp. rate 16, height '5\' 6"'  (1.676  m), weight 81.9 kg, SpO2 100 %. CVP:  [1 mmHg-9 mmHg] 8  mmHg  Vent Mode: PSV;CPAP FiO2 (%):  [40 %] 40 % Set Rate:  [20 bmp] 20 bmp Vt Set:  [470 mL] 470 mL PEEP:  [5 cmH20] 5 cmH20 Pressure Support:  [7 cmH20] 7 cmH20 Plateau Pressure:  [12 cmH20-18 cmH20] 13 cmH20   Intake/Output Summary (Last 24 hours) at 08/30/2021 0737 Last data filed at 08/30/2021 0500 Gross per 24 hour  Intake 1180.23 ml  Output 1323 ml  Net -142.77 ml   Filed Weights   08/28/21 0600 08/29/21 0256 08/30/21 0230  Weight: 84.1 kg 79.4 kg 81.9 kg    Examination: General: ill appearing, NAD, intubated HEENT: ETT, [place]/AT, PERRLA, neck rigidity  Neuro: lethargic, minimally responsive to simple commands, unable to lift her head, profound weakness  CV: RRR, no murmurs PULM: Bilateral rhonchi, intubated GI: soft, bowel sounds active in all 4 quadrants, non-tender, non-distended, PEG tube in place Extremities: warm/dry, pitting edema of b/l feet Skin: no rashes or lesions  Labs- WBC 6.9 Hemoglobin 6.5 Platelets 54 Creatinine 1.26  CT abdomen shows now acute abnormality Resolved Hospital Problem list     Assessment & Plan:  75 year old woman with recent metastatic MSSA infection presented from skilled nursing facility with shortness of breath, altered and subsequently intubated.  She was found to be hypotensive sustained cardiac arrest x2.  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  Presented with altered mental status and kidney dysfunction is endorgan damage.  Also with an elevated lactate requiring pressors.  She is a chronic PICC line and Foley catheter in place, all which have been removed.  Blood cultures showed Acalcoaceticus-baumannii.  Urine culture shows multiple species, suggest recollection. Susceptibilities to follow.  -Continue broad-spectrum antibiotics -Wean pressors for MAP goal greater than 65 -Follow  susceptibilities  -Infectious disease on board, appreciate recommendations  Acute Hypoxic Respiratory Failure  Multifactorial related to toxic metabolic encephalopathy as well as pulmonary airspace disease.  Chest radiograph showed left upper lobe collapse and volume overload. Optimize for extubation, currently profoundly weak and do not think she would tolerate extubation. -Continue broad-spectrum antibiotic -Daily SBT and SAT -VAP and PAD bundle -Fentanyl PRN  Cardiac arrest x2 Patient suffered 2 brief cardiac arrest postintubation in ED.  It appeared to be PEA which responded to 2 minutes CPR 1 epi push.  After epi patient had V. tach which was cardioverted.echocardiogram shows EF of 40 to 45%, mildly decreased LV function and LV regional wall abnormalities.  Mild concentric left ventricular hypertrophy and severe hypokinesis/akinesis of the LV, apical inferoseptal wall and inferior wall and apical segment.  Right heart function is normal.   -Normothermia protocol -Continuous telemetry -Maintain K greater than 4, mag greater than 2   Acute on chronic anemia Hgb 6.5 overnight. Transfused 1 unit PRBC. Unclear source, no s/sxs of bleed. Will monitor.  - Trend CBC - Transfuse for hgb <7  AKI Creatinine 1. 25, increased from 1.1 yesterday.  Baseline 0.8-0.9.  Suspect this is prerenal in the setting of septic shock and cardiac arrest.  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  Repeat cervical CT shows no acute signs of infection.  Follow clinical picture to determine need for neurosurgery/neurology consult.  Infectious disease consulted.  Thrombocytopenia Platelets 54.  Consider holding DVT ppx. -Trend CBC -Monitor for signs of bleeding  Urinary retention requiring chronic indwelling Foley Patient presented with Foley from SNF.  UA  unremarkable.  Foley exchanged. Urine culture had multiple species.   Recent embolic stroke on 3/53/2992 MRI 1.5 cm with acute ischemic likely affecting multiple penetrating arteries due to small vessel disease.  Severe protein calorie malnutrition PEG placed 8/23. Daughter states that patein had been consuming some oral nutrition at the SNF with nocturnal TFs.  No signs of infection along the PEG tube.  Deconditioning  Supplement nutrition and start tube feeds   Best Practice (right click and "Reselect all SmartList Selections" daily)   Diet/type: tubefeeds DVT prophylaxis: systemic heparin GI prophylaxis: PPI Lines: Central line Foley:  Yes, and it is still needed Code Status:  full code Last date of multidisciplinary goals of care discussion: 10/21  Labs   CBC: Recent Labs  Lab 08/28/21 0647 08/28/21 0806 08/28/21 0835 08/28/21 0911 08/29/21 0409 08/30/21 0436  WBC 6.7  --  6.5  --  8.2 6.9  NEUTROABS 5.7  --   --   --   --  4.0  HGB 9.1* 8.5* 8.7* 9.5* 7.3* 6.5*  HCT 31.3* 25.0* 30.0* 28.0* 24.6* 21.8*  MCV 113.0*  --  111.9*  --  108.4* 109.0*  PLT 84*  --  81*  --  72* 54*    Basic Metabolic Panel: Recent Labs  Lab 08/28/21 0647 08/28/21 0806 08/28/21 0835 08/28/21 0911 08/29/21 0409 08/30/21 0436  NA 137 144 140 143 142 140  K 5.7* 3.9 4.3 4.3 3.7 4.1  CL 105  --  110  --  110 112*  CO2 26  --  22  --  22 23  GLUCOSE 86  --  166*  --  128* 85  BUN 49*  --  46*  --  46* 36*  CREATININE 1.14*  --  1.11*  --  1.26* 1.25*  CALCIUM 9.5  --  8.8*  --  9.1 8.8*  MG  --   --  2.1  --  1.9 2.3  PHOS  --   --   --   --  2.7 3.1   GFR: Estimated Creatinine Clearance: 41.9 mL/min (A) (by C-G formula based on SCr of 1.25 mg/dL (H)). Recent Labs  Lab 08/28/21 0647 08/28/21 0835 08/28/21 1205 08/29/21 0409 08/30/21 0436  WBC 6.7 6.5  --  8.2 6.9  LATICACIDVEN 1.1 2.6* 2.0*  --   --     Liver Function Tests: Recent Labs  Lab 08/28/21 0647  AST 23   ALT 5  ALKPHOS 108  BILITOT 0.6  PROT 7.3  ALBUMIN 2.6*   No results for input(s): LIPASE, AMYLASE in the last 168 hours. Recent Labs  Lab 08/28/21 0835  AMMONIA 11    ABG    Component Value Date/Time   PHART 7.293 (L) 08/28/2021 0806   PCO2ART 45.7 08/28/2021 0806   PO2ART 446 (H) 08/28/2021 0806   HCO3 24.9 08/28/2021 0911   TCO2 26 08/28/2021 0911   ACIDBASEDEF 2.0 08/28/2021 0911   O2SAT 83.8 08/28/2021 1213     Coagulation Profile: Recent Labs  Lab 08/28/21 0647  INR 1.2    Cardiac Enzymes: No results for input(s): CKTOTAL, CKMB, CKMBINDEX, TROPONINI in the last 168 hours.  HbA1C: Hgb A1c MFr Bld  Date/Time Value Ref Range Status  07/21/2021 05:00 AM 5.1 4.8 - 5.6 % Final    Comment:    (  NOTE) Pre diabetes:          5.7%-6.4%  Diabetes:              >6.4%  Glycemic control for   <7.0% adults with diabetes     CBG: Recent Labs  Lab 08/29/21 1959 08/29/21 2016 08/29/21 2342 08/30/21 0347 08/30/21 0713  GLUCAP 62* 113* 72 83 79    Review of Systems:   Unable to assess   Past Medical History:  She,  has no past medical history on file.   Surgical History:   Past Surgical History:  Procedure Laterality Date   ANTERIOR CERVICAL DECOMP/DISCECTOMY FUSION N/A 06/16/2021   Procedure: ANTERIOR CERVICAL DISCECTOMY FUSION Cervical five -Cervical six, Evacuation of Epidural Abscess;  Surgeon: Judith Part, MD;  Location: [place];  Service: Neurosurgery;  Laterality: N/A;   IR GASTROSTOMY TUBE MOD SED  07/02/2021     Social History:      Family History:  Her family history is not on file.   Allergies No Known Allergies   Home Medications  Prior to Admission medications   Medication Sig Start Date End Date Taking? Authorizing Provider  acetaminophen (TYLENOL) 500 MG tablet Place 1 tablet (500 mg total) into feeding tube every 6 (six) hours as needed for mild pain (or Fever >/= 101). 08/06/21   Antonieta Pert, MD  albuterol (PROVENTIL) (2.5  MG/3ML) 0.083% nebulizer solution Take 3 mLs (2.5 mg total) by nebulization every 6 (six) hours as needed for wheezing or shortness of breath. 08/06/21   Antonieta Pert, MD  allopurinol (ZYLOPRIM) 100 MG tablet Take 100 mg by mouth daily.    [provider]  aspirin 81 MG chewable tablet Place 1 tablet (81 mg total) into feeding tube daily. 08/07/21   Antonieta Pert, MD  atorvastatin (LIPITOR) 40 MG tablet Place 1 tablet (40 mg total) into feeding tube at bedtime. 08/06/21   Antonieta Pert, MD  bisacodyl (DULCOLAX) 10 MG suppository Place 10 mg rectally as needed for moderate constipation.    [provider]  ceFAZolin (ANCEF) IVPB Inject 2 g into the vein every 8 (eight) hours. Indication:  bacteremia First Dose: Yes Last Day of Therapy:  08/25/21 Labs - Once weekly:  CBC/D and BMP, Labs - Every other week:  ESR and CRP Method of administration: IV Push Method of administration may be changed at the discretion of home infusion pharmacist based upon assessment of the patient and/or caregiver's ability to self-administer the medication ordered. 08/06/21 09/07/21  Antonieta Pert, MD  diclofenac Sodium (VOLTAREN) 1 % GEL Apply 4 g topically 4 (four) times daily. To neck 07/10/21   Samella Parr, NP  Emollient (LUBRIDERM SERIOUSLY SENSITIVE) LOTN Apply 1 application topically 2 (two) times daily. 08/06/21   Antonieta Pert, MD  folic acid (FOLVITE) 1 MG tablet Place 1 tablet (1 mg total) into feeding tube daily. 07/11/21   Samella Parr, NP  hydrOXYzine (ATARAX/VISTARIL) 25 MG tablet Place 0.5 tablets (12.5 mg total) into feeding tube every 8 (eight) hours as needed for itching. 08/06/21   Antonieta Pert, MD  loperamide HCl (IMODIUM) 1 MG/7.5ML suspension Place 15 mLs (2 mg total) into feeding tube as needed for diarrhea or loose stools. 08/06/21   Antonieta Pert, MD  Magnesium Hydroxide (MILK OF MAGNESIA PO) Take 30 mLs by mouth daily as needed (constipation).    [provider]  melatonin 3 MG TABS tablet  Take 1 tablet (3 mg total) by mouth at bedtime. 08/06/21  Antonieta Pert, MD  Multiple Vitamin (MULTIVITAMIN) LIQD Place 15 mLs into feeding tube daily. 07/11/21   Samella Parr, NP  niacin 500 MG tablet Place 1 tablet (500 mg total) into feeding tube at bedtime. 07/10/21   Samella Parr, NP  Nutritional Supplements (FEEDING SUPPLEMENT, JEVITY 1.5 CAL/FIBER,) LIQD Place 1,000 mLs into feeding tube continuous. 08/06/21   Antonieta Pert, MD  Nutritional Supplements (FEEDING SUPPLEMENT, PROSOURCE TF,) liquid Place 45 mLs into feeding tube 3 (three) times daily. 08/06/21   Antonieta Pert, MD  OLANZapine (ZYPREXA) 2.5 MG tablet Place 1 tablet (2.5 mg total) into feeding tube daily. 08/07/21   Antonieta Pert, MD  OLANZapine (ZYPREXA) 5 MG tablet Place 1 tablet (5 mg total) into feeding tube at bedtime. 08/06/21   Antonieta Pert, MD  oxyCODONE (OXY IR/ROXICODONE) 5 MG immediate release tablet Take 1 tablet (5 mg total) by mouth 2 (two) times daily as needed for up to 4 doses for breakthrough pain or severe pain. 08/06/21   Antonieta Pert, MD  saccharomyces boulardii (FLORASTOR) 250 MG capsule Place 1 capsule (250 mg total) into feeding tube 2 (two) times daily. 08/06/21 09/05/21  Antonieta Pert, MD  thiamine 100 MG tablet Place 1 tablet (100 mg total) into feeding tube daily. 07/11/21   Samella Parr, NP  Water For Irrigation, Sterile (FREE WATER) SOLN Place 200 mLs into feeding tube every 4 (four) hours. 08/06/21   Antonieta Pert, MD     Aissa Lisowski Laural Golden, DO PGY-3 IMTS

## 2021-08-30 NOTE — TOC Initial Note (Signed)
Transition of Care [facility]) - Initial/Assessment Note    Patient Details  Name: Kristy [name] MRN: [id number] Date of Birth: 11-16-1945  [facility] CM/SW Contact:    Tom-Johnson, Hershal Coria, RN Phone Number: 08/30/2021, 4:22 PM  Clinical Narrative:                 CM went to speak with patient about TOC needs for post hospital transition. Patient unable to speak as she is on vent. Daughter Kristy [name] at bedside and answered questions. Kristy [name] stated patient's hospitalization started initially in August when she had a mechanical fall x2 with severe right neck pain. Positive for ETOH use.  Admitted at the time for deconditioning and acute encephalopathy.Got better and went to rehab.Has been in and out of rehab and hospital since then. Daughter concerned that patient's infection is not fully treated when discharged and requests patient stay in the hospital until she no longer has infection. CM told daughter to speak with MD and ID. Kristy [name] states patient lived with her husband and independent with care prior to hospitalization. Has a PCP and has straight Medicare. Palliative consulted. Has peg tube. Medical workup continues. CM will continue to follow with needs.    Barriers to Discharge: Continued Medical Work up   Patient Goals and CMS Choice Patient states their goals for this hospitalization and ongoing recovery are:: To go home      Expected Discharge Plan and Services     Discharge Planning Services: CM Consult   Living arrangements for the past 2 months: Apartment                                      Prior Living Arrangements/Services Living arrangements for the past 2 months: Apartment Lives with:: Spouse Patient language and need for interpreter reviewed:: Yes Do you feel safe going back to the place where you live?: Yes      Need for Family Participation in Patient Care: Yes (Comment) Care giver support system in place?: Yes (comment)   Criminal  Activity/Legal Involvement Pertinent to Current Situation/Hospitalization: No - Comment as needed  Activities of Daily Living      Permission Sought/Granted Permission sought to share information with : Case Manager, Magazine features editor, Family Supports Permission granted to share information with : Yes, Verbal Permission Granted              Emotional Assessment Appearance:: Appears stated age Attitude/Demeanor/Rapport: Unable to Assess Affect (typically observed): Unable to Assess (On vent) Orientation: : Oriented to Self Alcohol / Substance Use: Not Applicable Psych Involvement: No (comment)  Admission diagnosis:  Acute respiratory failure with hypoxia (HCC) [J96.01] Sepsis (HCC) [A41.9] Altered mental status, unspecified altered mental status type [R41.82] Aspiration of food, initial encounter [T17.928A] Sepsis, due to unspecified organism, unspecified whether acute organ dysfunction present [facility]) [A41.9] Patient Active Problem List   Diagnosis Date Noted   Protein-calorie malnutrition, severe 08/29/2021   Pressure injury of skin 08/29/2021   HCAP (healthcare-associated pneumonia)    Goals of care, counseling/discussion    Vertebral osteomyelitis (HCC)    Epidural abscess    Generalized weakness 07/20/2021   Blood loss anemia 07/12/2021   Abnormal urinalysis 07/11/2021   Acute urinary retention 07/11/2021   MSSA (methicillin susceptible Staphylococcus aureus) infection/UTI-POA 07/11/2021   Cervical discitis 07/11/2021   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) 07/11/2021   Dysphagia, neurologic  07/11/2021   CKD stage G3b/A1, 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) 07/11/2021   HTN (hypertension) 07/11/2021   Grade I diastolic dysfunction 07/11/2021   HLD (hyperlipidemia) 07/11/2021   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   Malnutrition of moderate degree 06/18/2021   Metabolic encephalopathy  06/14/2021   Acute delirium 06/14/2021   Sepsis (HCC) 06/14/2021   Fall 06/11/2021   Alcoholic hepatitis 06/11/2021   PCP:  Darrow Bussing, MD Pharmacy:   [id number] [place], [place] - [phone] AT [facility] OF [facility] & [address] [place] [address] Phone: [phone] Fax: [phone]     Social Determinants of Health (SDOH) Interventions    Readmission Risk Interventions No flowsheet data found.

## 2021-08-30 NOTE — Progress Notes (Signed)
Nutrition Follow-up  DOCUMENTATION CODES:   Severe malnutrition in context of acute illness/injury  INTERVENTION:   Tube feeding via PEG: - Start Vital 1.5 @ 20 ml/hr and advance by 10 ml q 8 hours to goal rate of 50 ml/hr (1200 ml/day) - ProSource TF 45 ml BID  Tube feeding regimen at goal provides 1880 kcal, 103 grams of protein, and 917 ml of H2O.   NUTRITION DIAGNOSIS:   Severe Malnutrition related to acute illness (bacteremia) as evidenced by mild fat depletion, moderate muscle depletion, percent weight loss (11.7% weight loss in less than 2 months).  Ongoing, being addressed via TF  GOAL:   Patient will meet greater than or equal to 90% of their needs  Met via TF at goal  MONITOR:   Vent status, Labs, Weight trends, TF tolerance, Skin, I & O's  REASON FOR ASSESSMENT:   Ventilator, Consult Enteral/tube feeding initiation and management  ASSESSMENT:   75 year old female who presented to the ED from SNF on 10/19 with AMS. 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 Pt required intubation in the ED and suffered cardiac arrest x 2 in the ED. Pt admitted with septic shock, AKI.  Discussed pt with RN and during ICU rounds. Consult received for tube feeding initiation and management. Per RN, pt currently receiving tube feeds via PEG without issue. OG tube is clamped.  Per CCM, pt too weak and encephalopathic for vent liberalization at this time. Palliative Care has been consulted.  Admit weight: 84.1 kg Current weight: 81.9 kg  Pt continues to have generalized edema.  Patient is currently intubated on ventilator support MV: 8.1 L/min Temp (24hrs), Avg:98.6 F (37 C), Min:96.8 F (36 C), Max:100 F (37.8 C) BP (a-line): 140/69 MAP (a-line): 92  Drips: Levophed: currently off  Medications reviewed and include: IV protonix, IV abx  Labs reviewed: BUN 36, creatinine 1.25, hemoglobin 6.5, platelets  54 CBG's: 62-113 x 24 hours  UOP: 1323 ml x 24 hours I/O's: +1.5 L since admit  Diet Order:   Diet Order             Diet NPO time specified  Diet effective now                   EDUCATION NEEDS:   Not appropriate for education at this time  Skin:  Skin Assessment: Skin Integrity Issues: Stage II: R coccyx, sacrum, R buttock Other: MASD to bialteral buttocks  Last BM:  no documented BM  Height:   Ht Readings from Last 1 Encounters:  08/28/21 _0  (1.676 m)    Weight:   Wt Readings from Last 1 Encounters:  08/30/21 81.9 kg    BMI:  Body mass index is 29.14 kg/m.  Estimated Nutritional Needs:   Kcal:  1700-1900  Protein:  90-110 grams  Fluid:  1.7-1.9 L    Gustavus Bryant, MS, RD, LDN Inpatient Clinical Dietitian Please see AMiON for contact information.

## 2021-08-30 NOTE — Progress Notes (Signed)
eLink Physician-Brief Progress Note Patient Name: Kristy [name] DOB: 1945-12-22 MRN: [id number]   Date of Service  08/30/2021  HPI/Events of Note  Hemoglobin 6.5 gm / dl, no evidence of active bleeding. Platelet count 54 K, goal > 50 K.  eICU Interventions  One unit PRBC ordered transfused.        Thomasene Lot Lexi Conaty 08/30/2021, 5:47 AM

## 2021-08-31 DIAGNOSIS — I469 Cardiac arrest, cause unspecified: Secondary | ICD-10-CM

## 2021-08-31 DIAGNOSIS — R531 Weakness: Secondary | ICD-10-CM

## 2021-08-31 DIAGNOSIS — Z7189 Other specified counseling: Secondary | ICD-10-CM

## 2021-08-31 DIAGNOSIS — R5381 Other malaise: Secondary | ICD-10-CM

## 2021-08-31 DIAGNOSIS — N179 Acute kidney failure, unspecified: Secondary | ICD-10-CM

## 2021-08-31 DIAGNOSIS — Z515 Encounter for palliative care: Secondary | ICD-10-CM

## 2021-08-31 DIAGNOSIS — Z8673 Personal history of transient ischemic attack (TIA), and cerebral infarction without residual deficits: Secondary | ICD-10-CM

## 2021-08-31 DIAGNOSIS — J9601 Acute respiratory failure with hypoxia: Secondary | ICD-10-CM

## 2021-08-31 LAB — HEPATIC FUNCTION PANEL
ALT: 11 U/L (ref 0–44)
AST: 49 U/L — ABNORMAL HIGH (ref 15–41)
Albumin: 2.1 g/dL — ABNORMAL LOW (ref 3.5–5.0)
Alkaline Phosphatase: 88 U/L (ref 38–126)
Bilirubin, Direct: <0.1 mg/dL (ref 0.0–0.2)
Total Bilirubin: 0.8 mg/dL (ref 0.3–1.2)
Total Protein: 5.7 g/dL — ABNORMAL LOW (ref 6.5–8.1)

## 2021-08-31 LAB — BPAM RBC
Blood Product Expiration Date: 202211172359
ISSUE DATE / TIME: 202210211119
Unit Type and Rh: 1700

## 2021-08-31 LAB — CBC
HCT: 24.7 % — ABNORMAL LOW (ref 36.0–46.0)
Hemoglobin: 7.8 g/dL — ABNORMAL LOW (ref 12.0–15.0)
MCH: 32 pg (ref 26.0–34.0)
MCHC: 31.6 g/dL (ref 30.0–36.0)
MCV: 101.2 fL — ABNORMAL HIGH (ref 80.0–100.0)
Platelets: 41 10*3/uL — ABNORMAL LOW (ref 150–400)
RBC: 2.44 MIL/uL — ABNORMAL LOW (ref 3.87–5.11)
RDW: 23.2 % — ABNORMAL HIGH (ref 11.5–15.5)
WBC: 7.4 10*3/uL (ref 4.0–10.5)
nRBC: 0 % (ref 0.0–0.2)

## 2021-08-31 LAB — GLUCOSE, CAPILLARY
Glucose-Capillary: 106 mg/dL — ABNORMAL HIGH (ref 70–99)
Glucose-Capillary: 135 mg/dL — ABNORMAL HIGH (ref 70–99)
Glucose-Capillary: 145 mg/dL — ABNORMAL HIGH (ref 70–99)
Glucose-Capillary: 57 mg/dL — ABNORMAL LOW (ref 70–99)
Glucose-Capillary: 75 mg/dL (ref 70–99)
Glucose-Capillary: 75 mg/dL (ref 70–99)
Glucose-Capillary: 83 mg/dL (ref 70–99)
Glucose-Capillary: 86 mg/dL (ref 70–99)
Glucose-Capillary: 87 mg/dL (ref 70–99)
Glucose-Capillary: 94 mg/dL (ref 70–99)

## 2021-08-31 LAB — TYPE AND SCREEN
ABO/RH(D): B NEG
Antibody Screen: NEGATIVE
Unit division: 0

## 2021-08-31 LAB — BASIC METABOLIC PANEL
Anion gap: 7 (ref 5–15)
BUN: 34 mg/dL — ABNORMAL HIGH (ref 8–23)
CO2: 23 mmol/L (ref 22–32)
Calcium: 9 mg/dL (ref 8.9–10.3)
Chloride: 113 mmol/L — ABNORMAL HIGH (ref 98–111)
Creatinine, Ser: 1.07 mg/dL — ABNORMAL HIGH (ref 0.44–1.00)
GFR, Estimated: 54 mL/min — ABNORMAL LOW (ref >60)
Glucose, Bld: 91 mg/dL (ref 70–99)
Potassium: 4.1 mmol/L (ref 3.5–5.1)
Sodium: 143 mmol/L (ref 135–145)

## 2021-08-31 LAB — CULTURE, BLOOD (ROUTINE X 2): Special Requests: ADEQUATE

## 2021-08-31 LAB — MAGNESIUM: Magnesium: 2 mg/dL (ref 1.7–2.4)

## 2021-08-31 LAB — PHOSPHORUS: Phosphorus: 3.7 mg/dL (ref 2.5–4.6)

## 2021-08-31 MED ORDER — CHLORHEXIDINE GLUCONATE 0.12 % MT SOLN
15.0000 mL | Freq: Two times a day (BID) | OROMUCOSAL | Status: DC
  Administered 2021-08-31 – 2021-09-17 (×34): 15 mL via OROMUCOSAL
  Filled 2021-08-31 (×31): qty 15

## 2021-08-31 MED ORDER — ORAL CARE MOUTH RINSE
15.0000 mL | Freq: Two times a day (BID) | OROMUCOSAL | Status: DC
  Administered 2021-09-01 – 2021-09-17 (×27): 15 mL via OROMUCOSAL

## 2021-08-31 MED ORDER — ORAL CARE MOUTH RINSE
15.0000 mL | Freq: Two times a day (BID) | OROMUCOSAL | Status: DC
  Administered 2021-08-31: 15 mL via OROMUCOSAL

## 2021-08-31 MED ORDER — DEXTROSE 50 % IV SOLN
INTRAVENOUS | Status: AC
  Filled 2021-08-31: qty 50

## 2021-08-31 MED ORDER — DEXTROSE 50 % IV SOLN
INTRAVENOUS | Status: AC
  Administered 2021-08-31: 50 mL
  Filled 2021-08-31: qty 50

## 2021-08-31 MED ORDER — CHLORHEXIDINE GLUCONATE CLOTH 2 % EX PADS
6.0000 | MEDICATED_PAD | Freq: Every day | CUTANEOUS | Status: DC
  Administered 2021-08-31: 6 via TOPICAL

## 2021-08-31 MED ORDER — DEXTROSE 10 % IV SOLN: INTRAVENOUS | Status: DC

## 2021-08-31 NOTE — Progress Notes (Signed)
RT NOTE: attempted SBT on patient on CPAP/PSV of 10/5 at 0840.  Patient placed back on full support ventilator settings due to decreased RR.  Tolerating well at this time.  Will continue to monitor.

## 2021-08-31 NOTE — Progress Notes (Signed)
NAME:  Kristy [name], MRN:  [id number], DOB:  1945-11-29, LOS: 3 ADMISSION DATE:  08/28/2021, CONSULTATION DATE:  08/28/2021 REFERRING MD:  Dr. Pilar Plate, CHIEF COMPLAINT:  Cardiac arrest with sepsis    History of Present Illness:  Kristy [name] is a 75 y.o. 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 ED for mechanical fall x2 with severe right neck pain. Seen with elevated LFTs as well with reported ETOH use. Head and neck CT negative. Admitted to [facility] for deconditioning and acute encephalopathy  08/05 encephalopathy worsened and neuro and psychiatry was consulted. Psych was concerned for UTI for which UA was obtained and positive  08/07 MRI cervical, lumbar, and thoracic spine revealed discitis osteomyelitis at C5-6, C6-7, and possibly T1-2. 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   NSGY consulted and recommended surgical evacuation > C5-C6 Anterior Cervical Discectomy and Instrumented Fusion preformed later that day with large amount of pus seen  Treated with vancomycin and ceftriaxone  08/09 ABT changed to IV nafcillin for 8/7 epidural abscess cultures growing MSS ID discharge ABT plan: Nafcilln pharmacy dosed 8/07 - 06/30/21 followed by Cefazolin pharmacy dosed 8/22 - 08/11/2021 with Rifampin 600 mg BID to be continued throughout the treatment as she can tolerate  08/18 ENT consulted for right nostril epistaxis  08/23 PEG tube placed  09/01 discharged to SNF [facility]) in stable condition, had PEG tube in place for severe protein calorie malnutrition with high aspiration risk.  Also had indwelling Foley in place due to recurrent urinary retention. 09/10 presented back to [facility] emergency department due to altered mental status and concern for underlying infection.  Request per family made to transfer  to outside facility but no beds currently available.   CT/MRI imaging on admission revealed acute ischemic pontine infarct additionally concern for T12 and L1  discitis/osteomyelitis 9/11 Neurosurgery reconsulted and recommended against any further surgical interventions for persistent/progressive cervical and thoracic osteomyelitis 9/13:Repeat MRI brain w/ contrast:no intracranial focus of abnormal contrast 9/14 ID recommended to continue with cefazolin through 08/25/2021, outpatient ID follow-up.  Neurology and neurosurgery has signed off. 9/16 palliative care consulted full scope of treatment requested  9/27 discharged to SNF [facility]) ID  advised to continue Cefazolin through 08/25/2021 w/ PICC line-which will be inserted prior to discharge today.     Patient presented again to [facility] emergency department from local skilled nursing facility with complaints of shortness of breath.  Due to altered mental status and hypoxia patient intubated in ED.  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  Due to altered mental status and hypoxia patient intubated in ED.  Shortly after arrival patient cardiac arrested x2 with response to epi and cardioversion PCCM consulted for further management and admission  Interim History / Subjective:  Remains awake on vent. Seems uncomfortable.  Objective   Blood pressure (!) 136/58, pulse 82, temperature (!) 97.2 F (36.2 C), resp. rate 17, height 5\' 6"  (1.676 m), weight 78 kg, SpO2 100 %. CVP:  [5 mmHg-11 mmHg] 5 mmHg  Vent  Mode: PRVC FiO2 (%):  [40 %] 40 % Set Rate:  [20 bmp] 20 bmp Vt Set:  [470 mL] 470  mL PEEP:  [5 cmH20] 5 cmH20 Pressure Support:  [7 cmH20] 7 cmH20 Plateau Pressure:  [15 cmH20-20 cmH20] 15 cmH20   Intake/Output Summary (Last 24 hours) at 08/31/2021 1046 Last data filed at 08/31/2021 1000 Gross per 24 hour  Intake 2141.6 ml  Output 1300 ml  Net 841.6 ml    Filed Weights   08/29/21 0256 08/30/21 0230 08/31/21 0332  Weight: 79.4 kg 81.9 kg 78 kg    Examination: Ill appearing woman on vent Tracks with eyes Mild anasarca Ext warm, heart sounds regular Able to flicker movement toes Has her head turned to R, rigid, winces when I try to turn more toward midline Cannot get her to move upper ext  Labs- Renal function improved H/H better after 1 unit Plts down again No new imaging  Resolved Hospital Problem list     Assessment & Plan:  Respiratory arrest with post intubation cardiac arrest x 2 Post arrest encephalopathy, still an issue, concern for some anoxic injury Profound muscular deconditioning FTT Severe protein calorie malnutrition s/p PEG GPC and acinetobacter bacteremia- repeat cultures pending; no clear source on CT A/P Septic shock improved/resolved Acute kidney injury/ septic ATN stable Worsened H/H, plts suspicion from central line placement blood loss and bone marrow stunning.  AC on hold, keep eye on for now. 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 Chronic indwelling foley Persistent hypoglycemia- not a great sign  - Trial of extubation, weakness and pulmonary toileting may be biggest hurdle - Appreciate palliative reaching out to family - Unasyn duration TBD, appreciate ID assistance; may need to repeat imaging of spine at some point but not sure how it will change management at present - Vanc x 5 days - Need AC challenge again if plts/hgb stable - I do not see a good outcome  here  Best Practice (right click and "Reselect all SmartList Selections" daily)   Diet/type: tubefeeds DVT prophylaxis: SCD GI prophylaxis: PPI Lines: Central line Foley:  Yes, and it is still needed Code Status:  full code Last date of multidisciplinary goals of care discussion: pending  34 min cc time Myrla Halsted MD PCCM

## 2021-08-31 NOTE — Progress Notes (Signed)
Hypoglycemic Event  CBG: 57  Treatment: D50 50 mL (25 gm)  Symptoms: Pale  Follow-up CBG: Time:0556 CBG Result:145  Possible Reasons for Event: Inadequate meal intake and Medication regimen:    Comments/MD notified:protocol followed    Lincoln Brigham

## 2021-08-31 NOTE — Procedures (Signed)
Extubation Procedure Note  Patient Details:   Name: Kristy [name] DOB: 05-07-1946 MRN: [id number]   Airway Documentation:    Vent end date: 08/31/21 Vent end time: 0954   Evaluation  O2 sats: stable throughout Complications: No apparent complications Patient did tolerate procedure well. Bilateral Breath Sounds: Clear, Diminished   Patient extubated per MD order & placed on 4L [place].   Jacqulynn Cadet 08/31/2021, 10:00 AM

## 2021-08-31 NOTE — Consult Note (Signed)
Palliative Care Consult Note                                  Date: 08/31/2021   Patient Name: Kristy [name]  DOB: 12-30-1945  MRN: [id number]  Age / Sex: 75 y.o., female  PCP: Lujean Amel, MD Referring Physician: Candee Furbish, MD  Reason for Consultation: Establishing goals of care  HPI/Patient Profile: 75 y.o. 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 Shortly thereafter she cardiac arrested x 2 and was admitted to ICU.   No past medical history on file.  Subjective:   This NP Walden Field reviewed medical records, received report from team, assessed the patient and then meet at the patient's bedside to discuss diagnosis, prognosis, GOC, EOL wishes disposition and options.  I met with the patient at the bedside but she was not able to have a meaningful conversation.  I attempted to call her spouse Kristi Norment, but there was no answer and the voicemail box was full.  I did connect with her daughter Ylianna Almanzar.   Concept of Palliative Care was introduced as specialized medical care for people and their families living with serious illness.  If focuses on providing relief from the symptoms and stress of a serious illness.  The goal is to improve quality of life for both the patient and the family. Values and goals of care important to patient and family were attempted to be elicited.  Created space and opportunity for patient  and family to explore thoughts and feelings regarding current medical situation   Natural trajectory and current clinical status were discussed. Questions and concerns addressed. Patient  encouraged to call  with questions or concerns.    Patient/Family Understanding of Illness: The patient's daughter understands that she has multiple infections.  She knows she had a cardiac arrest and is intubated.  They did an EEG which showed that she is responsive.  The daughter did meet with infectious disease yesterday and they indicated they are checking for MRSA and she has a continued, ongoing spinal infection.  Joelene Millin and I had an extensive discussion about her disappointment with previous hospitalizations wherein she feels that she was discharged too early.  She went to a nursing home that she saw had a good rating on the Medicare website but felt that they provided very poor care.  She understands that PICC line antibiotics do not require inpatient hospitalization, however she feels that the nursing home was unable to properly do these.  She had a little bit better experience with the second nursing home at [place].  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  She has been married for 52 years to her husband Eduard Clos.  They have 2 children, Joelene Millin and Kimberly's brother.  Between the 2 children they have 6 grandkids.  She is particularly enamored and loves her grandchildren.  Joelene Millin states that she retired when [place] had her daughter states she could have more time available to spend with her grandchildren.  Patient Values: The things most important to the patient or  family and faith  Today's Discussion: Joelene Millin states at baseline, prior to her first hospitalization in early August, was that her mother lived at home with her father who just recently retired after working 50 years.  She was completely independent, totally functional.  She was looking forward to traveling and spending time in her "[place] years".  Her main concern currently is being discharged prior to infection being adequately  treated.  We had a full explanation of her mom's current clinical situation including encephalopathy likely multifactorial due to epidural abscess, continued osteomyelitis, AKI, septic shock.  Respiratory failure which is multifactorial including metabolic encephalopathy, pulmonary airspace disease and volume overload.  I shared that she was extubated, however she remains at high risk for reintubation.  When asked, her daughter shares that she would want her to be reintubated if it is necessary.  Additional issues include sequela of cardiac arrest x2, anemia, AKI which seems to be stable or slightly improved today, severe protein malnutrition with PEG tube in place and severe deconditioning/weakness.  We had a further discussion on advance care planning and patient's goals, as described below.  I provided emotional and general support through therapeutic listening, validation of emotions, and acknowledgment of frustrations.  I answered all questions and addressed all concerns.  Finally, Joelene Millin shared that her mother is a [place] and she.  Very much appreciate a chaplain coming to see her.  Review of Systems  Unable to perform ROS: Acuity of condition   Objective:   Primary Diagnoses: Present on Admission:  Sepsis [facility])   Physical Exam Vitals and nursing note reviewed.  Constitutional:      Appearance: She is overweight.     Comments: Tracks, minimally nods to questions  HENT:     Head: Normocephalic and atraumatic.  Cardiovascular:     Rate and Rhythm: Normal rate.  Pulmonary:     Effort: Pulmonary effort is normal. No respiratory distress.     Breath sounds: Rhonchi present.     Comments: Cough with significant gurgling, weak cough/attempt to expectorate, required oral suctioning Abdominal:     General: Abdomen is flat.     Palpations: Abdomen is soft.  Musculoskeletal:     Comments: Generalized weakness  Skin:    General: Skin is warm and dry.  Neurological:     Mental  Status: She is easily aroused. She is lethargic.    Vital Signs:  BP (!) 136/58   Pulse 82   Temp (!) 97.2 F (36.2 C)   Resp 17   Ht '5\' 6"'  (1.676 m)   Wt 78 kg   SpO2 100%   BMI 27.75 kg/m   Palliative Assessment/Data: 10-20% (NPO, tube feeds)    Advanced Care Planning:   Primary Decision Maker: NEXT OF KIN  Code Status/Advance Care Planning: Full code  A discussion was had today regarding advanced directives. Concepts specific to code status, artifical feeding and hydration, continued IV antibiotics and rehospitalization was had.  The difference between a aggressive medical intervention path and a palliative comfort care path for this patient at this time was had.  Elements pertinent to the MOST form was introduced and discussed, including desire for feeding tube, scope of care, antibiotics, fluids.  Joelene Millin shares that she wants [place] to "do it all" indicating desire for full scope of care and aggressive care.  She wants her to remain a full code.  She is agreeable to a trach if necessary.  Decisions/Changes to ACP: None at this time  Assessment &  Plan:   Impression: Critically ill 75 year old female with multiple chronic and acute issues.  She has chronic spinal osteomyelitis with a recent abscess status post drainage.  Respiratory failure initially intubated, now extubated but a high risk for reintubation.  Multifactorial encephalopathy, respiratory failure from infections, acute kidney injury, sepsis, pulmonary airspace disease, volume overload.  She did have a cardiac arrest x2 with mild heart dysfunction.  She has some anemia requiring transfusion.  Acute kidney injury with creatinine 1.25 yesterday, down to 1.07 today.  Baseline is 0.8.  She also had a CVA in September which could have some interplay with her confusion.  A major component of her prognosis is severe protein malnutrition, although PEG was recently placed which should allow for better nutrition.   Additionally, she is severely deconditioned and weak which could be contributing towards difficulty with airway/breathing management.  SUMMARY OF RECOMMENDATIONS   Continue full scope of care per family's wishes Remain full code Agreeable to aggressive measures, whatever is necessary Continue to treat the treatable Take one day at a time and follow for progress PMT will continue to follow and participate in further discussions as needed, as clinical picture evolves; continued support of patient and family  Symptom Management:  Per primary/PCCM team PMT is available to assist as needed  Prognosis:  Unable to determine  Discharge Planning:  To Be Determined   Discussed with: Patient's family, medical team, nursing team    Thank you for allowing [place] to participate in the care of JODELLE FAUSTO PMT will continue to support holistically.  Time Total: 105 min  Greater than 50%  of this time was spent counseling and coordinating care related to the above assessment and plan.  Signed by: Walden Field, NP Palliative Medicine Team  Team Phone # [phone] (Nights/Weekends)  08/31/2021, 10:25 AM

## 2021-09-01 LAB — GLUCOSE, CAPILLARY
Glucose-Capillary: 89 mg/dL (ref 70–99)
Glucose-Capillary: 102 mg/dL — ABNORMAL HIGH (ref 70–99)
Glucose-Capillary: 99 mg/dL (ref 70–99)
Glucose-Capillary: 132 mg/dL — ABNORMAL HIGH (ref 70–99)
Glucose-Capillary: 100 mg/dL — ABNORMAL HIGH (ref 70–99)

## 2021-09-01 LAB — POCT I-STAT 7, (LYTES, BLD GAS, ICA,H+H)
Acid-Base Excess: 0 mmol/L (ref 0.0–2.0)
Bicarbonate: 25.6 mmol/L (ref 20.0–28.0)
Calcium, Ion: 1.3 mmol/L (ref 1.15–1.40)
HCT: 21 % — ABNORMAL LOW (ref 36.0–46.0)
Hemoglobin: 7.1 g/dL — ABNORMAL LOW (ref 12.0–15.0)
O2 Saturation: 91 %
Patient temperature: 97.7
Potassium: 4 mmol/L (ref 3.5–5.1)
Sodium: 144 mmol/L (ref 135–145)
TCO2: 27 mmol/L (ref 22–32)
pCO2 arterial: 43 mmHg (ref 32.0–48.0)
pH, Arterial: 7.38 (ref 7.350–7.450)
pO2, Arterial: 60 mmHg — ABNORMAL LOW (ref 83.0–108.0)

## 2021-09-01 LAB — CBC
HCT: 23.2 % — ABNORMAL LOW (ref 36.0–46.0)
Hemoglobin: 7.1 g/dL — ABNORMAL LOW (ref 12.0–15.0)
MCH: 31.8 pg (ref 26.0–34.0)
MCHC: 30.6 g/dL (ref 30.0–36.0)
MCV: 104 fL — ABNORMAL HIGH (ref 80.0–100.0)
Platelets: 41 10*3/uL — ABNORMAL LOW (ref 150–400)
RBC: 2.23 MIL/uL — ABNORMAL LOW (ref 3.87–5.11)
RDW: 22.4 % — ABNORMAL HIGH (ref 11.5–15.5)
WBC: 7.8 10*3/uL (ref 4.0–10.5)
nRBC: 0 % (ref 0.0–0.2)

## 2021-09-01 LAB — BASIC METABOLIC PANEL
Anion gap: 6 (ref 5–15)
BUN: 26 mg/dL — ABNORMAL HIGH (ref 8–23)
CO2: 26 mmol/L (ref 22–32)
Calcium: 8.8 mg/dL — ABNORMAL LOW (ref 8.9–10.3)
Chloride: 111 mmol/L (ref 98–111)
Creatinine, Ser: 0.88 mg/dL (ref 0.44–1.00)
GFR, Estimated: >60 mL/min (ref >60)
Glucose, Bld: 90 mg/dL (ref 70–99)
Potassium: 4.1 mmol/L (ref 3.5–5.1)
Sodium: 143 mmol/L (ref 135–145)

## 2021-09-01 LAB — PHOSPHORUS: Phosphorus: 3.6 mg/dL (ref 2.5–4.6)

## 2021-09-01 LAB — MAGNESIUM: Magnesium: 1.6 mg/dL — ABNORMAL LOW (ref 1.7–2.4)

## 2021-09-01 MED ORDER — MIDODRINE HCL 5 MG PO TABS: 5.0000 mg | ORAL_TABLET | Freq: Three times a day (TID) | ORAL | Status: DC

## 2021-09-01 MED ORDER — CHLORHEXIDINE GLUCONATE CLOTH 2 % EX PADS
6.0000 | MEDICATED_PAD | Freq: Every day | CUTANEOUS | Status: DC
  Administered 2021-09-03 – 2021-09-14 (×12): 6 via TOPICAL

## 2021-09-01 MED ORDER — PNEUMOCOCCAL VAC POLYVALENT 25 MCG/0.5ML IJ INJ
0.5000 mL | INJECTION | INTRAMUSCULAR | Status: DC | PRN
  Filled 2021-09-01: qty 0.5

## 2021-09-01 MED ORDER — MAGNESIUM SULFATE 4 GM/100ML IV SOLN
4.0000 g | Freq: Once | INTRAVENOUS | Status: AC
  Administered 2021-09-01: 4 g via INTRAVENOUS
  Filled 2021-09-01: qty 100

## 2021-09-01 MED ORDER — MAGNESIUM SULFATE 2 GM/50ML IV SOLN
2.0000 g | Freq: Once | INTRAVENOUS | Status: AC
  Administered 2021-09-01: 2 g via INTRAVENOUS
  Filled 2021-09-01: qty 50

## 2021-09-01 MED ORDER — MIDODRINE HCL 5 MG PO TABS
5.0000 mg | ORAL_TABLET | Freq: Three times a day (TID) | ORAL | Status: DC
  Administered 2021-09-01: 5 mg
  Filled 2021-09-01: qty 1

## 2021-09-01 MED ORDER — HYDROCORTISONE 10 MG PO TABS
10.0000 mg | ORAL_TABLET | Freq: Two times a day (BID) | ORAL | Status: DC
  Administered 2021-09-01 – 2021-09-02 (×3): 10 mg
  Filled 2021-09-01 (×3): qty 1

## 2021-09-01 MED ORDER — INFLUENZA VAC A&B SA ADJ QUAD 0.5 ML IM PRSY
0.5000 mL | PREFILLED_SYRINGE | INTRAMUSCULAR | Status: DC | PRN
  Filled 2021-09-01: qty 0.5

## 2021-09-01 MED ORDER — MIDODRINE HCL 5 MG PO TABS
5.0000 mg | ORAL_TABLET | Freq: Three times a day (TID) | ORAL | Status: DC
  Administered 2021-09-01 – 2021-09-02 (×2): 5 mg
  Filled 2021-09-01 (×2): qty 1

## 2021-09-01 NOTE — Progress Notes (Signed)
NAME:  Kristy [name], MRN:  [id number], DOB:  08/12/1946, LOS: 4 ADMISSION DATE:  08/28/2021, CONSULTATION DATE:  08/28/2021 REFERRING MD:  Dr. Pilar Plate, CHIEF COMPLAINT:  Cardiac arrest with sepsis    History of Present Illness:  [name] is a 75 y.o. 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 Seen with elevated LFTs as well with reported ETOH use. Head and neck CT negative. Admitted to [facility] for deconditioning and acute encephalopathy  08/05 encephalopathy worsened and neuro and psychiatry was consulted. Psych was concerned for UTI for which UA was obtained and positive  08/07 MRI cervical, lumbar, and thoracic spine revealed discitis osteomyelitis at C5-6, C6-7, and possibly T1-2. 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   NSGY consulted and recommended surgical evacuation > C5-C6 Anterior Cervical Discectomy and Instrumented Fusion preformed later that day with large amount of pus seen  Treated with vancomycin and ceftriaxone  08/09 ABT changed to IV nafcillin for 8/7 epidural abscess cultures growing MSS ID discharge ABT plan: Nafcilln pharmacy dosed 8/07 - 06/30/21 followed by Cefazolin pharmacy dosed 8/22 - 08/11/2021 with Rifampin 600 mg BID to be continued throughout the treatment as she can tolerate  08/18 ENT consulted for right nostril epistaxis  08/23 PEG tube placed  09/01 discharged to SNF [facility]) in stable condition, had PEG tube in place for severe protein calorie malnutrition with high aspiration risk.  Also had indwelling Foley in place due to recurrent urinary retention. 09/10 presented back to [facility] emergency department due to altered mental status and concern for underlying infection.  Request per family made to transfer  to outside facility but no beds currently available.   CT/MRI imaging on admission revealed acute ischemic pontine infarct additionally concern for T12 and L1  discitis/osteomyelitis 9/11 Neurosurgery reconsulted and recommended against any further surgical interventions for persistent/progressive cervical and thoracic osteomyelitis 9/13:Repeat MRI brain w/ contrast:no intracranial focus of abnormal contrast 9/14 ID recommended to continue with cefazolin through 08/25/2021, outpatient ID follow-up.  Neurology and neurosurgery has signed off. 9/16 palliative care consulted full scope of treatment requested  9/27 discharged to SNF [facility]) ID  advised to continue Cefazolin through 08/25/2021 w/ PICC line-which will be inserted prior to discharge today.     Patient presented again to [facility] emergency department from local skilled nursing facility with complaints of shortness of breath.  Due to altered mental status and hypoxia patient intubated in ED.  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  Due to altered mental status and hypoxia patient intubated in ED.  Shortly after arrival patient cardiac arrested x2 with response to epi and cardioversion PCCM consulted for further management and admission  Interim History / Subjective:  Extubated yesterday. Waxing waning levels of alertness. Back on low dose levophed. Still hypoglycemic.  Objective   Blood pressure (!) 94/54, pulse 74, temperature 97.9 F (36.6 C), temperature source Bladder, resp. rate 14, height 5\' 6"  (1.676 m), weight 78 kg, SpO2 100 %.  CVP:  [8 mmHg-10 mmHg] 8 mmHg       Intake/Output Summary (Last 24 hours) at 09/01/2021 0907 Last data filed at 09/01/2021 0800 Gross per 24 hour  Intake 3065.49 ml  Output 1580 ml  Net 1485.49 ml    Filed Weights   08/31/21 0332 08/31/21 2326 09/01/21 0138  Weight: 78 kg 78 kg 78 kg    Examination: Ill appearing woman on vent Moves R>L UE yesterday, less movement this AM Able to follow commands yesterday, less so today Has chronic papular rash over chest, looks chronic Global anasarca Weak, deconditioned, malnourished  Labs- Cr improved Plts remain low Hgb drifting down a bit  Resolved Hospital Problem list     Assessment & Plan:  Respiratory arrest with post intubation cardiac arrest x 2 Post arrest encephalopathy, still an issue, concern for some anoxic injury Profound muscular deconditioning FTT Severe protein calorie malnutrition s/p PEG Staph haemolyticus (?contaminant) and acinetobacter bacteremia- repeat cultures in process; no clear source on CT A/P Septic shock improved/resolved Acute kidney injury/ septic ATN stable Worsened H/H, plts- plts stable, h/h still having some drift, there was a fair bit of blood loss with recent CVL, no other s/s of bleeding, malnutrition and septic state making bone marrow stunning; keep an eye on for now 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- was on [facility] PTA Chronic indwelling foley Persistent hypoglycemia- not a great sign  - Add empiric low dose oral hydrocortisone and midodrine; wean D10 drip and levophed as able - PT/OT - Unasyn duration TBD, appreciate ID assistance; may need to repeat imaging of spine at some point but not sure how it will change management at present - Vanc x 5 days - Need AC challenge again if plts/hgb stable: still not there - AM CBC, if H/H still dropping would check DIC  panel and CT A/P - I do not see a good outcome here, appreciate continued palliative input  Best Practice (right click and "Reselect all SmartList Selections" daily)   Diet/type: tubefeeds DVT prophylaxis: SCD GI prophylaxis: PPI Lines: Central line Foley:  Yes, and it is still needed Code Status:  full code Last date of multidisciplinary goals of care discussion: pending  Patient critically ill due to shock Interventions to address this today pressor titration Risk of deterioration without these interventions is high  I personally spent 33 minutes providing critical care not including any separately billable procedures  Myrla Halsted MD [place] [facility]  Prefer epic messenger for cross cover needs If after hours, please call E-link

## 2021-09-01 NOTE — Progress Notes (Signed)
[facility] ADULT ICU REPLACEMENT PROTOCOL   The patient does apply for the [facility] Adult ICU Electrolyte Replacment Protocol based on the criteria listed below:   1.Exclusion criteria: TCTS patients, ECMO patients, and Dialysis patients 2. Is GFR >/= 30 ml/min? Yes.    Patient's GFR today is >60 3. Is SCr </= 2? Yes.   Patient's SCr is 0.88 mg/dL 4. Did SCr increase >/= 0.5 in 24 hours? No. 5.Pt's weight >40kg  Yes.   6. Abnormal electrolyte(s): Mag 1.6  7. Electrolytes replaced per protocol 8.  Call MD STAT for K+ </= 2.5, Phos </= 1, or Mag </= 1 Physician:  Dr.Sommer  Kristy [name] 09/01/2021 4:12 AM

## 2021-09-02 ENCOUNTER — Inpatient Hospital Stay: Payer: Self-pay

## 2021-09-02 DIAGNOSIS — R627 Adult failure to thrive: Secondary | ICD-10-CM

## 2021-09-02 LAB — BASIC METABOLIC PANEL
Anion gap: 8 (ref 5–15)
BUN: 22 mg/dL (ref 8–23)
CO2: 26 mmol/L (ref 22–32)
Calcium: 8.7 mg/dL — ABNORMAL LOW (ref 8.9–10.3)
Chloride: 108 mmol/L (ref 98–111)
Creatinine, Ser: 0.91 mg/dL (ref 0.44–1.00)
GFR, Estimated: >60 mL/min (ref >60)
Glucose, Bld: 131 mg/dL — ABNORMAL HIGH (ref 70–99)
Potassium: 3.8 mmol/L (ref 3.5–5.1)
Sodium: 142 mmol/L (ref 135–145)

## 2021-09-02 LAB — DIC (DISSEMINATED INTRAVASCULAR COAGULATION)PANEL
D-Dimer, Quant: 2.47 ug/mL-FEU — ABNORMAL HIGH (ref 0.00–0.50)
Fibrinogen: 564 mg/dL — ABNORMAL HIGH (ref 210–475)
INR: 1.2 (ref 0.8–1.2)
Platelets: 40 10*3/uL — ABNORMAL LOW (ref 150–400)
Prothrombin Time: 14.9 seconds (ref 11.4–15.2)
Smear Review: NONE SEEN
aPTT: 32 seconds (ref 24–36)

## 2021-09-02 LAB — CBC
HCT: 23.9 % — ABNORMAL LOW (ref 36.0–46.0)
Hemoglobin: 7.2 g/dL — ABNORMAL LOW (ref 12.0–15.0)
MCH: 31.4 pg (ref 26.0–34.0)
MCHC: 30.1 g/dL (ref 30.0–36.0)
MCV: 104.4 fL — ABNORMAL HIGH (ref 80.0–100.0)
Platelets: 44 10*3/uL — ABNORMAL LOW (ref 150–400)
RBC: 2.29 MIL/uL — ABNORMAL LOW (ref 3.87–5.11)
RDW: 21.7 % — ABNORMAL HIGH (ref 11.5–15.5)
WBC: 7.3 10*3/uL (ref 4.0–10.5)
nRBC: 0 % (ref 0.0–0.2)

## 2021-09-02 LAB — GLUCOSE, CAPILLARY
Glucose-Capillary: 106 mg/dL — ABNORMAL HIGH (ref 70–99)
Glucose-Capillary: 108 mg/dL — ABNORMAL HIGH (ref 70–99)
Glucose-Capillary: 119 mg/dL — ABNORMAL HIGH (ref 70–99)
Glucose-Capillary: 121 mg/dL — ABNORMAL HIGH (ref 70–99)
Glucose-Capillary: 91 mg/dL (ref 70–99)
Glucose-Capillary: 94 mg/dL (ref 70–99)
Glucose-Capillary: 99 mg/dL (ref 70–99)

## 2021-09-02 LAB — IRON AND TIBC
Iron: 49 ug/dL (ref 28–170)
Saturation Ratios: 19 % (ref 10.4–31.8)
TIBC: 259 ug/dL (ref 250–450)
UIBC: 210 ug/dL

## 2021-09-02 LAB — CULTURE, BLOOD (ROUTINE X 2): Culture: NO GROWTH

## 2021-09-02 LAB — MAGNESIUM: Magnesium: 2 mg/dL (ref 1.7–2.4)

## 2021-09-02 LAB — FERRITIN: Ferritin: 250 ng/mL (ref 11–307)

## 2021-09-02 MED ORDER — HYDROCORTISONE 10 MG PO TABS
10.0000 mg | ORAL_TABLET | Freq: Two times a day (BID) | ORAL | Status: DC
  Administered 2021-09-02: 10 mg
  Filled 2021-09-02 (×2): qty 1

## 2021-09-02 MED ORDER — PROSOURCE TF PO LIQD
45.0000 mL | Freq: Every day | ORAL | Status: DC
  Administered 2021-09-03 – 2021-09-16 (×14): 45 mL
  Filled 2021-09-02 (×14): qty 45

## 2021-09-02 MED ORDER — "THROMBI-PAD 3""X3"" EX PADS"
1.0000 | MEDICATED_PAD | Freq: Once | CUTANEOUS | Status: AC
  Administered 2021-09-02: 1 via TOPICAL
  Filled 2021-09-02: qty 1

## 2021-09-02 MED ORDER — JEVITY 1.5 CAL/FIBER PO LIQD
1500.0000 mL | ORAL | Status: DC
  Administered 2021-09-02 – 2021-09-09 (×6): 1500 mL
  Administered 2021-09-09: 1000 mL
  Administered 2021-09-10: 1500 mL
  Administered 2021-09-11: 1000 mL
  Administered 2021-09-12 – 2021-09-16 (×4): 1500 mL
  Filled 2021-09-02 (×16): qty 2000

## 2021-09-02 MED ORDER — VITAL 1.5 CAL PO LIQD: 1000.0000 mL | ORAL | Status: DC

## 2021-09-02 NOTE — Evaluation (Signed)
Occupational Therapy Evaluation Patient Details Name: Kristy [name] MRN: [id number] DOB: 1946-07-01 Today's Date: 09/02/2021   History of Present Illness 75 yo admitted 10/19 from SNF with SOB and AMS with intubation in ED. Shortly after cardiac arrest x 2. Extubated 10/22. 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  Spoke to patients daughter who is unclear how pt has been transferring at SNF (lift vs physical assist) but reports her getting into a chair, requiring total assist for ADLs. Today, patient supine in bed requiring total assist +2 for rolling and repositioning in bed, peri care.  Pt easily agitated and requires redirection throughout session, confused and oriented to self only.  Based on performance today, believe she is at baseline level and no acute OT needs have been identified. OT will sign off.  If further needs arise, please re-consult.       Recommendations for follow up therapy are one component of a multi-disciplinary discharge planning process, led by the attending physician.  Recommendations may be updated based on patient status, additional functional criteria and insurance authorization.   Follow Up Recommendations  Long-term institutional care without follow-up therapy    Assistance Recommended at Discharge Frequent or constant Supervision/Assistance  Functional Status Assessment  Patient has had a recent decline in their functional status and/or demonstrates limited ability to make significant improvements in function in a reasonable and predictable amount of time  Equipment Recommendations  None recommended by OT    Recommendations for Other Services       Precautions /  Restrictions Precautions Precautions: Fall Restrictions Weight Bearing Restrictions: No      Mobility Bed Mobility Overal bed mobility: Needs Assistance Bed Mobility: Rolling Rolling: Total assist;+2 for physical assistance         General bed mobility comments: total assist +2 to roll in bed and reposition    Transfers                   General transfer comment: deferred      Balance                                           ADL either performed or assessed with clinical judgement   ADL Overall ADL's : Needs assistance/impaired;At baseline                                     Functional mobility during ADLs: Total assistance;+2 for physical assistance;+2 for safety/equipment General ADL Comments: total assist for all self care at this time     Vision   Additional Comments: continue assessment, pt able to scan in room     Perception     Praxis      Pertinent Vitals/Pain Pain Assessment: Faces Faces Pain Scale: Hurts a little bit Pain Location: generalized Pain Descriptors / Indicators: Discomfort;Guarding Pain Intervention(s): Limited activity within patient's tolerance;Monitored during session;Repositioned     Hand Dominance     Extremity/Trunk Assessment Upper Extremity Assessment Upper Extremity Assessment: Generalized weakness (with edema to BUEs)   Lower Extremity Assessment  Lower Extremity Assessment: Defer to PT evaluation   Cervical / Trunk Assessment Cervical / Trunk Assessment: Neck Surgery (recent)   Communication Communication Communication: No difficulties   Cognition Arousal/Alertness: Awake/alert Behavior During Therapy: Agitated Overall Cognitive Status: No family/caregiver present to determine baseline cognitive functioning                                 General Comments: pt oriented to self only, limited ability to follow simple commands.  Requires constant redirection,  easily agitated.     General Comments       Exercises     Shoulder Instructions      Home Living Family/patient expects to be discharged to:: Skilled nursing facility                                        Prior Functioning/Environment Prior Level of Function : Needs assist  Cognitive Assist : Mobility (cognitive);ADLs (cognitive) Mobility (Cognitive): Step by step cues ADLs (Cognitive): Step by step cues Physical Assist : Mobility (physical);ADLs (physical) Mobility (physical): Bed mobility;Transfers ADLs (physical): Grooming;Bathing;Dressing;Toileting Mobility Comments: daughter reports pt has been in chair at SNF, but unclear if she assisted or used a lift ADLs Comments: daughter reports patient requires assist for all ADls        OT Problem List: Decreased strength;Decreased activity tolerance;Decreased cognition;Decreased safety awareness;Decreased knowledge of precautions;Increased edema;Impaired UE functional use      OT Treatment/Interventions:      OT Goals(Current goals can be found in the care plan section) Acute Rehab OT Goals Patient Stated Goal: none stated OT Goal Formulation: Patient unable to participate in goal setting  OT Frequency:     Barriers to D/C:            Co-evaluation PT/OT/SLP Co-Evaluation/Treatment: Yes Reason for Co-Treatment: For patient/therapist safety;To address functional/ADL transfers;Complexity of the patient's impairments (multi-system involvement);Necessary to address cognition/behavior during functional activity   OT goals addressed during session: ADL's and self-care      AM-PAC OT "6 Clicks" Daily Activity     Outcome Measure Help from another person eating meals?: Total Help from another person taking care of personal grooming?: Total Help from another person toileting, which includes using toliet, bedpan, or urinal?: Total Help from another person bathing (including washing, rinsing, drying)?:  Total Help from another person to put on and taking off regular upper body clothing?: Total Help from another person to put on and taking off regular lower body clothing?: Total 6 Click Score: 6   End of Session Nurse Communication: Mobility status  Activity Tolerance: Patient tolerated treatment well Patient left: in bed;with call bell/phone within reach;with bed alarm set;with nursing/sitter in room;with SCD's reapplied;with restraints reapplied  OT Visit Diagnosis: Other abnormalities of gait and mobility (R26.89);Muscle weakness (generalized) (M62.81)                Time: 9030-0923 OT Time Calculation (min): 17 min Charges:  OT General Charges $OT Visit: 1 Visit OT Evaluation $OT Eval Moderate Complexity: 1 Mod  Barry Brunner, OT Acute Rehabilitation Services Pager [phone] Office [phone]   Chancy Milroy 09/02/2021, 11:35 AM

## 2021-09-02 NOTE — Progress Notes (Signed)
    [facility]    Date of Admission:  08/28/2021   Total days of antibiotics vanco and day 6 amp/sub           ID: Kristy [name] is a 74 y.o. female with acinetobacter bacteremia Active Problems:   Sepsis (HCC)   Protein-calorie malnutrition, severe   Pressure injury of skin    Subjective: Afebrile. Remains confused.  Medications:   chlorhexidine  15 mL Mouth Rinse BID   Chlorhexidine Gluconate Cloth  6 each Topical Daily   [START ON 09/03/2021] feeding supplement (PROSource TF)  45 mL Per Tube Daily   hydrocerin   Topical BID   hydrocortisone  10 mg Per Tube BID   mouth rinse  15 mL Mouth Rinse q12n4p   mupirocin ointment  1 application Nasal BID   pantoprazole (PROTONIX) IV  40 mg Intravenous QHS    Objective: Vital signs in last 24 hours: Temp:  [96.8 F (36 C)-98.8 F (37.1 C)] 98.8 F (37.1 C) (10/24 0900) Pulse Rate:  [77-108] 108 (10/24 0900) Resp:  [17-33] 22 (10/24 0900) BP: (107-150)/(59-109) 138/78 (10/24 0900) SpO2:  [98 %-100 %] 98 % (10/24 0900) Arterial Line BP: (120-164)/(49-75) 161/64 (10/24 0900) Weight:  [78 kg] 78 kg (10/24 0450) Physical Exam  Constitutional:  oriented to person, only. appears well-developed and well-nourished. No distress.  HENT: [place]/AT, PERRLA, no scleral icterus Mouth/Throat: Oropharynx is clear and moist. No oropharyngeal exudate.  Cardiovascular: Normal rate, regular rhythm and normal heart sounds. Exam reveals no gallop and no friction rub.  No murmur heard.  Neck: IJ is poorly - Pulmonary/Chest: Effort normal and breath sounds normal. No respiratory distress.  has no wheezes.  Neck = supple, no nuchal rigidity Abdominal: Soft. Bowel sounds are normal.  exhibits no distension. There is no tenderness.  Lymphadenopathy: no cervical adenopathy. No axillary adenopathy Neurological: alert and oriented to person, place, and time.  Skin: Skin is warm and dry. Ezcema and erythoderma type change to  arms. Psychiatric: a normal mood and affect.  behavior is normal.     Lab Results Recent Labs    08/31/21 0327 09/01/21 0250 09/01/21 0937  WBC 7.4 7.8  --   HGB 7.8* 7.1* 7.1*  HCT 24.7* 23.2* 21.0*  NA 143 143 144  K 4.1 4.1 4.0  CL 113* 111  --   CO2 23 26  --   BUN 34* 26*  --   CREATININE 1.07* 0.88  --    Liver Panel Recent Labs    08/31/21 0327  PROT 5.7*  ALBUMIN 2.1*  AST 49*  ALT 11  ALKPHOS 88  BILITOT 0.8  BILIDIR <0.1  IBILI NOT CALCULATED    Microbiology: Acinetobacter - blood cx Studies/Results: [place] EKG SITE RITE  Result Date: 09/02/2021 If Site Rite image not attached, placement could not be confirmed due to current cardiac rhythm.    Assessment/Plan: Acinetobacter bacteremia = plan to treat for 14 days. Currently day 6 of amp.sub. ideally would like to avoid sending out with central line. Continue on amp/sub then change to bactrim for remaining time period, consider if midline can be used.  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 Continue with moisturizer in areas that are dry and cracked.   [facility] for Infectious Diseases Pager: [phone]  09/02/2021, 1:34 PM

## 2021-09-02 NOTE — Progress Notes (Signed)
Bilateral arms assessed with [place] and 2 IV nurses for midline placement for continued outpatient ATB infusion. No appropriate vein found for midline placement. Notified nurse and Md via secure chat. Tomasita Morrow, RN VAST

## 2021-09-02 NOTE — Progress Notes (Signed)
Dear Doctor: This patient has been identified as a candidate for PICC for the following reason (s): outpatient infusion therapy If you agree, please write an order for the indicated device.  Thank you for supporting the early vascular access assessment program.

## 2021-09-02 NOTE — Evaluation (Addendum)
Physical Therapy Evaluation and Discharge Patient Details Name: Kristy [name] MRN: [id number] DOB: 1946/04/09 Today's Date: 09/02/2021  History of Present Illness  75 yo admitted 10/19 from SNF with SOB and AMS with intubation in ED. Shortly after cardiac arrest x 2. Extubated 10/22. 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 Pt agitated with decreased cognition and awareness with limited ability to follow single step commands. Pt appearance suggestive of bed level care with daughter reporting she is somehow getting to a  chair at SNF but SNF did not answer (sent to a voicemail and rang without answer on 2nd attempt after connected to secretary). Pt not currently able to actively participate in therapy with pt admitted from SNF. Will defer all further needs to SNF with staff already performing pressure relief and position changes.        Recommendations for follow up therapy are one component of a multi-disciplinary discharge planning process, led by the attending physician.  Recommendations may be updated based on patient status, additional functional criteria and insurance authorization.  Follow Up Recommendations Long-term institutional care without follow-up therapy    Assistance Recommended at Discharge Frequent or constant Supervision/Assistance  Functional Status Assessment Patient has not had a recent decline in their functional status  Equipment Recommendations  None recommended by PT    Recommendations for Other Services       Precautions / Restrictions Precautions Precautions: Fall Precaution Comments: bil mittens, peg Restrictions Weight Bearing Restrictions: No      Mobility  Bed Mobility Overal bed  mobility: Needs Assistance Bed Mobility: Rolling Rolling: Total assist;+2 for physical assistance         General bed mobility comments: total assist +2 to roll in bed and reposition as well as slide toward [facility] and shift into chair position    Transfers                   General transfer comment: unable    Ambulation/Gait                Stairs            Wheelchair Mobility    Modified Rankin (Stroke Patients Only)       Balance Overall balance assessment: Needs assistance                                           Pertinent Vitals/Pain Pain Assessment: PAINAD Faces Pain Scale: Hurts a little bit Breathing: normal Negative Vocalization: occasional moan/groan, low speech, negative/disapproving quality Facial Expression: smiling or inexpressive Body Language: tense, distressed pacing, fidgeting Consolability: distracted or reassured by voice/touch PAINAD Score: 3 Pain Location: generalized Pain Descriptors / Indicators: Discomfort;Guarding Pain Intervention(s): Limited activity within patient's tolerance;Monitored during session;Repositioned    Home Living Family/patient expects to be discharged to:: Skilled nursing facility                   Additional Comments: Unsure of accuracy given cognitive deficits.    Prior Function Prior Level of Function : Needs assist  Cognitive Assist : ADLs (cognitive);Mobility (cognitive) Mobility (Cognitive): Set up cues ADLs (Cognitive): Step by step cues Physical Assist :  Mobility (physical);ADLs (physical) Mobility (physical): Bed mobility;Transfers ADLs (physical): Grooming;Bathing;Dressing;Toileting Mobility Comments: daughter reports pt has been in chair at SNF, but unclear if she assisted or used a lift. called [place] but sent to voicemail x 2 attempts ADLs Comments: daughter reports patient requires assist for all ADls     Hand Dominance        Extremity/Trunk  Assessment   Upper Extremity Assessment Upper Extremity Assessment: Defer to OT evaluation    Lower Extremity Assessment Lower Extremity Assessment: Generalized weakness (pt able to bend knees bil with assist and increased time. Pt not actively moving on command or with rolling)    Cervical / Trunk Assessment Cervical / Trunk Assessment: Neck Surgery (pt with extreme fixed extension in supine with wound to posterior scalp with clear hair loss from pressure)  Communication   Communication: No difficulties  Cognition Arousal/Alertness: Awake/alert Behavior During Therapy: Agitated Overall Cognitive Status: No family/caregiver present to determine baseline cognitive functioning                                 General Comments: pt oriented to self only, limited ability to follow simple commands.  Requires constant redirection, easily agitated. Pt stating she hears boyfriend calling and that she walked yesterday        General Comments      Exercises     Assessment/Plan    PT Assessment All further PT needs can be met in the next venue of care  PT Problem List Decreased mobility;Decreased safety awareness;Decreased strength;Decreased range of motion;Decreased coordination;Decreased cognition;Decreased activity tolerance       PT Treatment Interventions      PT Goals (Current goals can be found in the Care Plan section)  Acute Rehab PT Goals PT Goal Formulation: All assessment and education complete, DC therapy    Frequency     Barriers to discharge        Co-evaluation PT/OT/SLP Co-Evaluation/Treatment: Yes Reason for Co-Treatment: Complexity of the patient's impairments (multi-system involvement);For patient/therapist safety PT goals addressed during session: Mobility/safety with mobility OT goals addressed during session: ADL's and self-care       AM-PAC PT "6 Clicks" Mobility  Outcome Measure Help needed turning from your back to your side while in  a flat bed without using bedrails?: Total Help needed moving from lying on your back to sitting on the side of a flat bed without using bedrails?: Total Help needed moving to and from a bed to a chair (including a wheelchair)?: Total Help needed standing up from a chair using your arms (e.g., wheelchair or bedside chair)?: Total Help needed to walk in hospital room?: Total Help needed climbing 3-5 steps with a railing? : Total 6 Click Score: 6    End of Session   Activity Tolerance: Patient tolerated treatment well Patient left: in bed;with call bell/phone within reach;with bed alarm set Nurse Communication: Mobility status PT Visit Diagnosis: Muscle weakness (generalized) (M62.81);Other symptoms and signs involving the nervous system (R29.898)    Time: 1100-1115 PT Time Calculation (min) (ACUTE ONLY): 15 min   Charges:   PT Evaluation $PT Eval Moderate Complexity: 1 Mod          Anieya Helman P, PT Acute Rehabilitation Services Pager: [phone] Office: [phone]   Sandy Salaam Willford Rabideau 09/02/2021, 11:49 AM

## 2021-09-02 NOTE — Progress Notes (Signed)
This chaplain attempted to respond to PMT consult and family's request for Pt. spiritual care.    The Pt. is working with PT at the time of the visit.  This chaplain plans to revisit.  Chaplain Stephanie Acre [phone]

## 2021-09-02 NOTE — Progress Notes (Signed)
NAME:  Kristy [name], MRN:  [id number], DOB:  [birth year], LOS: 5 ADMISSION DATE:  08/28/2021, CONSULTATION DATE:  08/28/2021 REFERRING MD:  Dr. Pilar Plate, CHIEF COMPLAINT:  Cardiac arrest with sepsis    History of Present Illness:  Kristy [name] is a 75 y.o. 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 Seen with elevated LFTs as well with reported ETOH use. Head and neck CT negative. Admitted to [facility] for deconditioning and acute encephalopathy  08/05 encephalopathy worsened and neuro and psychiatry was consulted. Psych was concerned for UTI for which UA was obtained and positive  08/07 MRI cervical, lumbar, and thoracic spine revealed discitis osteomyelitis at C5-6, C6-7, and possibly T1-2. 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   NSGY consulted and recommended surgical evacuation > C5-C6 Anterior Cervical Discectomy and Instrumented Fusion preformed later that day with large amount of pus seen  Treated with vancomycin and ceftriaxone  08/09 ABT changed to IV nafcillin for 8/7 epidural abscess cultures growing MSS ID discharge ABT plan: Nafcilln pharmacy dosed 8/07 - 06/30/21 followed by Cefazolin pharmacy dosed 8/22 - 08/11/2021 with Rifampin 600 mg BID to be continued throughout the treatment as she can tolerate  08/18 ENT consulted for right nostril epistaxis  08/23 PEG tube placed  09/01 discharged to SNF [facility]) in stable condition, had PEG tube in place for severe protein calorie malnutrition with high aspiration risk.  Also had indwelling Foley in place due to recurrent urinary retention. 09/10 presented back to [facility] emergency department due to altered mental status and concern for underlying infection.  Request per family made to transfer  to outside facility but no beds currently available.   CT/MRI imaging on admission revealed acute ischemic pontine infarct additionally concern for T12 and L1  discitis/osteomyelitis 9/11 Neurosurgery reconsulted and recommended against any further surgical interventions for persistent/progressive cervical and thoracic osteomyelitis 9/13:Repeat MRI brain w/ contrast:no intracranial focus of abnormal contrast 9/14 ID recommended to continue with cefazolin through 08/25/2021, outpatient ID follow-up.  Neurology and neurosurgery has signed off. 9/16 palliative care consulted full scope of treatment requested  9/27 discharged to SNF [facility]) ID  advised to continue Cefazolin through 08/25/2021 w/ PICC line-which will be inserted prior to discharge today.     Patient presented again to [facility] emergency department from local skilled nursing facility with complaints of shortness of breath.  Due to altered mental status and hypoxia patient intubated in ED.  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  Due to altered mental status and hypoxia patient intubated in ED.  Shortly after arrival patient cardiac arrested x2 with response to epi and cardioversion PCCM consulted for further management and admission 10/23 Extubated   Interim History / Subjective:  Extubated yesterday, no overnight events. No complaints this morning.   Objective   Blood pressure 122/64, pulse 87, temperature 97.9 F (36.6 C), resp. rate (!) 25, height 5\' 6"  (1.676 m), weight 78 kg, SpO2 100 %. CVP:  [7  mmHg-9 mmHg] 9 mmHg      Intake/Output Summary (Last 24 hours) at 09/02/2021  0703 Last data filed at 09/02/2021 0600 Gross per 24 hour  Intake 3198.52 ml  Output 1840 ml  Net 1358.52 ml   Filed Weights   08/31/21 2326 09/01/21 0138 09/02/21 0450  Weight: 78 kg 78 kg 78 kg    Examination: Physical Exam General: alert, chronically ill-appearing, no acute distress HEENT: Normocephalic, atraumatic, EOM intact, conjunctiva normal CV: Regular rate and rhythm, no murmurs rubs or gallops Pulm: Clear to auscultation bilaterally, normal work of breathing Abdomen: Soft, nondistended, bowel sounds present, no tenderness to palpation MSK: Mild anasarca, warm and dry Skin: Warm and dry Neuro: Following commands, oriented to self   Labs- Fibrinogen 564 D-dimer 2.47 Platelets 40 BMP and CBC pending  Resolved Hospital Problem list     Assessment & Plan:  75 year old woman with recent metastatic MSSA infection presented from skilled nursing facility with shortness of breath, altered and subsequently intubated.  She was found to be hypotensive sustained cardiac arrest x2.  Respiratory arrest with post intubation cardiac arrest x 2 Post arrest encephalopathy, still an issue, concern for some anoxic injury Extubated on 10/23.  Doing well on room air this morning.  Continue pulmonary hygiene.  High risk of reintubation due to overall illnesses and deconditioning. -Continue pulmonary hygiene -Still some concern for anoxic injury, following simple commands  GPC and acinetobacter bacteremia- repeat cultures pending; no clear source on CT A/P Septic shock improved/resolved Multiple recent admissions stemming from MSSA C and T spine epidural abscesses and disciitis s/p C5/6 ACDF on 06/16/21. Repeat blood cultures show no growth to date.  No clear source on CT abdomen and pelvis.  No longer requiring pressor support.  Can transfer out of the ICU today.  Will need a PICC line.  ID following, appreciate recommendations. -Vancomycin for 5 days -Unasyn duration TBD  Profound  muscular deconditioning FTT Severe protein calorie malnutrition  S/p PEG during her last hospitalization. Appreciated appreciate palliative care consult.  Family wants to continue full supportive care.  Acute kidney injury/ septic ATN, resolved Morning BMP pending.  However renal function has improved.  Acute on chronic anemia Thrombocytopenia  Suspicion from central line placement blood loss and bone marrow stunning.  AC on hold, platelets stable at 41. Transfused 1 unit PRBC on 10/21. CBC stable.  - Trend CBC   Chronic indwelling foley Exchanged on admission. Exchange since most recent negative BC.   Persistent hypoglycemia BG remains in the 80-90s. Continue to trend.   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

## 2021-09-02 NOTE — Progress Notes (Signed)
Daily Progress Note   Patient Name: Kristy [name]       Date: 09/02/2021 DOB: 1946-05-25  Age: 75 y.o. MRN#: [id number] Attending Physician: Lorin Glass, MD Primary Care Physician: Darrow Bussing, MD Admit Date: 08/28/2021  Reason for Consultation/Follow-up: Establishing goals of care  Patient Profile/HPI:  75 y.o. 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 Shortly thereafter she cardiac arrested x 2 and was admitted to ICU.     Subjective: Lamica is awake today. She doesn't follow any directions. She responds verbally but speech is unintelligible.  Scarlette Shorts for Palliative team followup. [name] notes that she was able to understand patient yesterday- and that patient was able to make her preferences known regarding if she wanted a fan, and if she wanted her granddaughter to visit.  [name] continues to hope that patient is able to start working with PT and discharge back to SNF.  I reviewed the PT visit note today with [name] indicating that patient was not able to participate in therapy.  I discussed advanced care planning with [name] and inquired what plan she had for patient's care if patient was unable to participate in physical therapy and therefore unable to go to SNF for rehab? [name] inquired what options there would be and asked what other people did. I reviewed options for private pay SNF, private pay supplemental care at home and hospice. Discussed hospice philosophy  and services.   Review of Systems  Unable to perform ROS: Mental acuity    Physical Exam Vitals and nursing note reviewed.  Constitutional:      General: She is not in acute distress.    Appearance: She is ill-appearing.  Cardiovascular:     Rate and Rhythm: Normal rate.     Pulses: Normal pulses.  Pulmonary:     Effort: Pulmonary effort is normal.  Skin:    Comments: Diffuse anasarca  Neurological:     Mental Status: She is disoriented.            Vital Signs: BP 138/78   Pulse (!) 108  Temp 98.8 F (37.1 C)   Resp (!) 22   Ht 5\' 6"  (1.676 m)   Wt 78 kg   SpO2 98%   BMI 27.75 kg/m  SpO2: SpO2: 98 % O2 Device: O2 Device: Nasal Cannula O2 Flow Rate: O2 Flow Rate (L/min): 4 L/min  Intake/output summary:  Intake/Output Summary (Last 24 hours) at 09/02/2021 1323 Last data filed at 09/02/2021 0800 Gross per 24 hour  Intake 2580.94 ml  Output 1840 ml  Net 740.94 ml   LBM: Last BM Date: 09/02/21 Baseline Weight: Weight: 84.1 kg Most recent weight: Weight: 78 kg       Palliative Assessment/Data: PPS: 20%        Palliative Care Assessment & Plan    Assessment/Recommendations/Plan   Continue current plan of care PMT will follow for patient progress vs decline and discuss GOC prn   Code Status: Full code  Prognosis:  Unable to determine  Discharge Planning: To Be Determined  Care plan was discussed with patient's daughter.   Thank you for allowing the Palliative Medicine Team to assist in the care of this patient.  Total time:  40 mins Prolonged billing: No     Greater than 50%  of this time was spent counseling and coordinating care related to the above assessment and plan.  09/04/21, AGNP-C Palliative Medicine   Please contact Palliative Medicine Team phone at [phone] for questions and concerns.

## 2021-09-02 NOTE — Progress Notes (Addendum)
Nutrition Follow-up  DOCUMENTATION CODES:   Severe malnutrition in context of acute illness/injury  INTERVENTION:   Continue tube feeding via PEG: - Change to Jevity 1.5 @ 60 ml/hr (1440 ml/day) - ProSource TF 45 ml daily  Tube feeding regimen provides 2220 kcal, 103 grams of protein, and 1094 ml of H2O.   NUTRITION DIAGNOSIS:   Severe Malnutrition related to acute illness (bacteremia) as evidenced by mild fat depletion, moderate muscle depletion, percent weight loss (11.7% weight loss in less than 2 months).  Ongoing, being addressed via TF  GOAL:   Patient will meet greater than or equal to 90% of their needs  Met via TF  MONITOR:   Labs, Weight trends, TF tolerance, Diet advancement, Skin, I & O's  REASON FOR ASSESSMENT:   Ventilator, Consult Enteral/tube feeding initiation and management  ASSESSMENT:   75 year old female who presented to the ED from SNF on 10/19 with AMS. 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 Pt required intubation in the ED and suffered cardiac arrest x 2 in the ED. Pt admitted with septic shock, AKI.  10/22 - extubated  Discussed pt with RN and during ICU rounds. Pt tolerating current TF without issues but was having hypoglycemic episodes so on D10 drip. RD to adjust TF formula and rate. Discussed with Pharmacy. Spoke with pt at bedside who denies abdominal pain/discomfort. Pt denies any N/V at this time. Pt having bowel movements.  Admit weight: 84.1 kg Current weight: 78 kg  Pt continues to have moderate pitting edema to BUE and BLE. Unsure of pt's true dry weight at this time.  Current TF: Vital 1.5 @ 50 ml/hr, ProSource TF 45 ml BID  Medications reviewed and include: IV protonix, IV abx, D10 @ 50 ml/hr  Labs reviewed: BUN 26, magnesium 1.6, hemoglobin 7.1, platelets 40 CBG's: 91-132 x 24 hours  UOP: 1840 ml x 24 hours I/O's: +4.4 L since admit  Diet Order:   Diet Order              Diet NPO time specified  Diet effective now                   EDUCATION NEEDS:   Not appropriate for education at this time  Skin:  Skin Assessment: Skin Integrity Issues: Stage II: R coccyx, sacrum, R buttock Other: MASD to bialteral buttocks  Last BM:  09/02/21 large type 6  Height:   Ht Readings from Last 1 Encounters:  08/28/21 _0  (1.676 m)    Weight:   Wt Readings from Last 1 Encounters:  09/02/21 78 kg    BMI:  Body mass index is 27.75 kg/m.  Estimated Nutritional Needs:   Kcal:  2000-2200  Protein:  100-115 grams  Fluid:  >/= 2.0 L    Gustavus Bryant, MS, RD, LDN Inpatient Clinical Dietitian Please see AMiON for contact information.

## 2021-09-03 LAB — BASIC METABOLIC PANEL
Anion gap: 9 (ref 5–15)
BUN: 19 mg/dL (ref 8–23)
CO2: 29 mmol/L (ref 22–32)
Calcium: 9 mg/dL (ref 8.9–10.3)
Chloride: 104 mmol/L (ref 98–111)
Creatinine, Ser: 0.77 mg/dL (ref 0.44–1.00)
GFR, Estimated: >60 mL/min (ref >60)
Glucose, Bld: 121 mg/dL — ABNORMAL HIGH (ref 70–99)
Potassium: 3.3 mmol/L — ABNORMAL LOW (ref 3.5–5.1)
Sodium: 142 mmol/L (ref 135–145)

## 2021-09-03 LAB — CBC
HCT: 26.6 % — ABNORMAL LOW (ref 36.0–46.0)
Hemoglobin: 8.2 g/dL — ABNORMAL LOW (ref 12.0–15.0)
MCH: 31.9 pg (ref 26.0–34.0)
MCHC: 30.8 g/dL (ref 30.0–36.0)
MCV: 103.5 fL — ABNORMAL HIGH (ref 80.0–100.0)
Platelets: 59 10*3/uL — ABNORMAL LOW (ref 150–400)
RBC: 2.57 MIL/uL — ABNORMAL LOW (ref 3.87–5.11)
RDW: 21.3 % — ABNORMAL HIGH (ref 11.5–15.5)
WBC: 9.5 10*3/uL (ref 4.0–10.5)
nRBC: 0 % (ref 0.0–0.2)

## 2021-09-03 LAB — GLUCOSE, CAPILLARY
Glucose-Capillary: 132 mg/dL — ABNORMAL HIGH (ref 70–99)
Glucose-Capillary: 117 mg/dL — ABNORMAL HIGH (ref 70–99)
Glucose-Capillary: 64 mg/dL — ABNORMAL LOW (ref 70–99)
Glucose-Capillary: 85 mg/dL (ref 70–99)
Glucose-Capillary: 107 mg/dL — ABNORMAL HIGH (ref 70–99)
Glucose-Capillary: 85 mg/dL (ref 70–99)
Glucose-Capillary: 99 mg/dL (ref 70–99)

## 2021-09-03 LAB — CULTURE, BLOOD (ROUTINE X 2): Culture: NO GROWTH

## 2021-09-03 LAB — MAGNESIUM: Magnesium: 1.7 mg/dL (ref 1.7–2.4)

## 2021-09-03 LAB — PHOSPHORUS: Phosphorus: 3.2 mg/dL (ref 2.5–4.6)

## 2021-09-03 MED ORDER — POTASSIUM CHLORIDE 10 MEQ/50ML IV SOLN
10.0000 meq | INTRAVENOUS | Status: DC
  Administered 2021-09-03: 10 meq via INTRAVENOUS
  Filled 2021-09-03 (×4): qty 50

## 2021-09-03 MED ORDER — HYDROCORTISONE 5 MG PO TABS
5.0000 mg | ORAL_TABLET | Freq: Two times a day (BID) | ORAL | Status: DC
  Filled 2021-09-03: qty 1

## 2021-09-03 MED ORDER — POTASSIUM CHLORIDE 20 MEQ PO PACK
40.0000 meq | PACK | Freq: Two times a day (BID) | ORAL | Status: AC
  Administered 2021-09-03 (×2): 40 meq via ORAL
  Filled 2021-09-03 (×2): qty 2

## 2021-09-03 MED ORDER — SODIUM CHLORIDE 0.9% FLUSH
10.0000 mL | Freq: Two times a day (BID) | INTRAVENOUS | Status: DC
  Administered 2021-09-03 – 2021-09-05 (×5): 10 mL
  Administered 2021-09-06: 40 mL
  Administered 2021-09-06 – 2021-09-13 (×14): 10 mL
  Administered 2021-09-14: 20 mL
  Administered 2021-09-14 – 2021-09-15 (×2): 10 mL

## 2021-09-03 MED ORDER — "THROMBI-PAD 3""X3"" EX PADS"
1.0000 | MEDICATED_PAD | Freq: Once | CUTANEOUS | Status: AC
  Administered 2021-09-03: 1 via TOPICAL
  Filled 2021-09-03: qty 1

## 2021-09-03 MED ORDER — SODIUM CHLORIDE 0.9% FLUSH: 10.0000 mL | INTRAVENOUS | Status: DC | PRN

## 2021-09-03 MED ORDER — MAGNESIUM SULFATE 2 GM/50ML IV SOLN
2.0000 g | Freq: Once | INTRAVENOUS | Status: AC
  Administered 2021-09-03: 2 g via INTRAVENOUS
  Filled 2021-09-03: qty 50

## 2021-09-03 MED ORDER — POTASSIUM CHLORIDE 20 MEQ PO PACK
20.0000 meq | PACK | ORAL | Status: DC
  Administered 2021-09-03: 20 meq
  Filled 2021-09-03: qty 1

## 2021-09-03 MED ORDER — HYDROCORTISONE 10 MG PO TABS
10.0000 mg | ORAL_TABLET | Freq: Two times a day (BID) | ORAL | Status: DC
  Administered 2021-09-03 – 2021-09-04 (×3): 10 mg
  Filled 2021-09-03 (×4): qty 1

## 2021-09-03 NOTE — Progress Notes (Signed)
[facility] ADULT ICU REPLACEMENT PROTOCOL   The patient does apply for the [facility] Adult ICU Electrolyte Replacment Protocol based on the criteria listed below:   1.Exclusion criteria: TCTS patients, ECMO patients, and Dialysis patients 2. Is GFR >/= 30 ml/min? Yes.    Patient's GFR today is >60 3. Is SCr </= 2? Yes.   Patient's SCr is 0.77 mg/dL 4. Did SCr increase >/= 0.5 in 24 hours? No. 5.Pt's weight >40kg  Yes.   6. Abnormal electrolyte(s): K+ 3.3, mag 1.7  7. Electrolytes replaced per protocol 8.  Call MD STAT for K+ </= 2.5, Phos </= 1, or Mag </= 1 Physician:  n/a  Melvern Banker 09/03/2021 5:42 AM

## 2021-09-03 NOTE — Progress Notes (Signed)
[facility] PROGRESS NOTE    Progress Note  Kristy [name]  QBH:[id number] DOB: [birth year] DOA: 08/28/2021 PCP: Darrow Bussing, MD     Brief Narrative:   Kristy [name] is an 75 y.o. frail elderly female with past medical history history significant alcohol abuse, admitted on 06/10/2021 for encephalopathy, and MRI of the cervical spine was done that revealed discitis and osteomyelitis neurosurgery was consulted recommended evacuation of abscess with anterior cervical discectomy C5-C6, she was treated with IV vancomycin and Rocephin, ID was consulted changed to cefazolin with rifampin, due to her poor oral intake and malnutrition PEG tube was placed on 07/11/2021 as well as a Foley catheter due to urinary retention, came back to the ED for altered mental status on 07/20/2021 MRI of the brain showed acute ischemic pontine infarct and imaging also showed concern of discitis and osteomyelitis T12-L1 neurosurgery was reconsulted recommended no further groin intervention, repeated MRI on 07/23/2021 showed no acute abnormal intracranial defects, ID was reconsulted on 07/24/2021 recommended to continue cefazolin and neurosurgery signed off.  Perative care was consulted on 07/26/2021 and family wanted full scope of treatment.  Discharge back to skilled nursing facility on 08/06/2021 [facility] on cefazolin until 08/25/2021 with a PICC line and was sent back to the emergency room for shortness of breath and altered mental status she rested in the ED responded to CPR in epic with cardioversion admitted to critical care due to evolving sepsis and acute respiratory failure possibly due to aspiration pneumonia with encephalopathy and transferred [facility] 09/03/2021    Significant Events: 10/19 Presented from local skilled nursing facility with complaints of shortness of breath.  Due to altered mental status and hypoxia patient intubated in ED.  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  Gram-positive cocci/Acinetobacter bacteremia and septic shock: Repeated blood cultures have been negative till date. CT scan of the abdomen and pelvis showed no source of infection.  She has been weaned off pressors. ID was consulted who recommended 14 days of empiric antibiotics currently on day 7 of 14 of Unasyn.  They would like to avoid sending her out with a central line she can be changed to oral Bactrim when she is able to tolerate it.  ATN due to sepsis: Now resolved.  Severe protein caloric malnutrition: Currently has a G-tube Perative care has been consulted and family still wants versus full supportive care.  Acute on chronic anemia/thrombocytopenia: She has a history of chronic alcohol abuse she is status post 1 unit of packed red blood cells on 08/30/2032. Also contributing to her anemia is likely due to her chronic repeated infection.  Acute metabolic encephalopathy: There is a high concern of anoxic brain injury in the setting of cardiac arrest.  Chronic indwelling Foley catheter: Exchange on her recent admission. Urine cultures remain negative till date.  Persistent hypoglycemia: Currently on steroids and tube feedings to sustain her blood glucose  Acute CVA recent small pontine stroke: She  was not on anticoagulation or aspirin prior to admission. Not  a candidate for antiplatelet therapy due to thrombocytopenia  Profound muscular deconditioning: Physical therapy to continue to work with her.  Goals of care/ethics: Palliative care has been consulted due to her progressive decline in her poor prognosis He wants to continue full scope of treatment, the patient remains full code they would like to continue aggressive measures what ever is necessary.  Stage II sacral pressure ulcer present on admission RN Pressure Injury Documentation: Pressure Injury 08/28/21 Coccyx Right Stage 2 -  Partial thickness loss of dermis presenting as a shallow open injury with a red, pink wound bed without slough. (Active)  08/28/21 1030  Location: Coccyx  Location Orientation: Right  Staging: Stage 2 -  Partial thickness loss of dermis presenting as a shallow open injury with a red, pink wound bed without slough.  Wound Description (Comments):   Present on Admission: Yes     Pressure Injury 08/28/21 Sacrum Medial Stage 2 -  Partial thickness loss of dermis presenting as a shallow open injury with a red, pink wound bed without slough. (Active)  08/28/21 1030  Location: Sacrum  Location Orientation: Medial  Staging: Stage 2 -  Partial thickness loss of dermis presenting as a shallow open injury with a red, pink wound bed without slough.  Wound Description (Comments):   Present on Admission:      Pressure Injury 08/28/21 Buttocks Right Stage 2 -  Partial thickness loss of dermis presenting as a shallow open injury with a red, pink wound bed without slough. (Active)  08/28/21 1030  Location: Buttocks  Location Orientation: Right  Staging: Stage 2 -  Partial thickness loss of dermis presenting as a shallow open injury with a red, pink wound bed without slough.  Wound Description (Comments):   Present on Admission:     DVT prophylaxis: SCD Family Communication:Family Status is: Inpatient  Remains inpatient appropriate because: Acute severity of illness  due to but not limited Acinetobacter bacteremia, CVA, acute blood loss anemia, acute respiratory failure possibly due to aspiration pneumonia in the setting of cardiac arrest   Code Status:     Code Status Orders  (From admission, onward)           Start     Ordered   08/28/21 0820  Full code  Continuous        08/28/21 0825           Code Status History     Date Active Date Inactive Code Status Order ID Comments User Context   08/28/2021 0620 08/28/2021 0825 Full Code [id number]  Sabas Sous, MD ED   07/21/2021 0105 08/06/2021 1933 Full Code [id number]  Meredeth Ide, MD ED   06/11/2021 0509 07/11/2021 2056 Full Code [id number]  Darlin Drop, DO ED         IV Access:   Peripheral IV   Procedures and diagnostic studies:   [place] EKG SITE RITE  Result Date: 09/02/2021 If Site Rite image not attached, placement could not be confirmed due to current cardiac rhythm.  [place] EKG SITE RITE  Result Date: 09/02/2021 If Site Rite image not attached, placement could not be confirmed due to current cardiac rhythm.    Medical Consultants:   None.   Subjective:    DESTENEE GUERRY nonverbal moaning and groaning  Objective:    Vitals:   09/03/21 0400 09/03/21 0500 09/03/21 0600 09/03/21 0700  BP: (!) 160/85 (!) 155/80 Marland Kitchen)  165/87 (!) 165/84  Pulse: 85 81 91 91  Resp: 20 (!) 26 (!) 22 (!) 26  Temp: (!) 96.4 F (35.8 C) (!) 96.6 F (35.9 C) (!) 96.6 F (35.9 C) (!) 96.6 F (35.9 C)  TempSrc:  Bladder Bladder Bladder  SpO2: 99% 99% 100% 100%  Weight:      Height:       SpO2: 100 % O2 Flow Rate (L/min): 4 L/min FiO2 (%): 40 %   Intake/Output Summary (Last 24 hours) at 09/03/2021 1610 Last data filed at 09/03/2021 0600 Gross per 24 hour  Intake 1787.31 ml  Output 4500 ml  Net -2712.69 ml   Filed Weights   08/31/21 2326 09/01/21 0138 09/02/21 0450  Weight: 78 kg 78 kg 78 kg    Exam: General exam: In no acute distress. Respiratory system: Good air  movement and clear to auscultation. Cardiovascular system: S1 & S2 heard, RRR. No JVD. Gastrointestinal system: Abdomen is nondistended, soft and nontender.  Extremities: No pedal edema. Skin: No rashes, lesions or ulcers Psychiatry: No judgement or insight appear normal.    Data Reviewed:    Labs: Basic Metabolic Panel: Recent Labs  Lab 08/29/21 0409 08/30/21 0436 08/31/21 0327 09/01/21 0250 09/01/21 0937 09/02/21 1416 09/03/21 0417  NA 142 140 143 143 144 142 142  K 3.7 4.1 4.1 4.1 4.0 3.8 3.3*  CL 110 112* 113* 111  --  108 104  CO2 22 23 23 26   --  26 29  GLUCOSE 128* 85 91 90  --  131* 121*  BUN 46* 36* 34* 26*  --  22 19  CREATININE 1.26* 1.25* 1.07* 0.88  --  0.91 0.77  CALCIUM 9.1 8.8* 9.0 8.8*  --  8.7* 9.0  MG 1.9 2.3 2.0 1.6*  --  2.0 1.7  PHOS 2.7 3.1 3.7 3.6  --   --  3.2   GFR Estimated Creatinine Clearance: 64.1 mL/min (by C-G formula based on SCr of 0.77 mg/dL). Liver Function Tests: Recent Labs  Lab 08/28/21 0647 08/31/21 0327  AST 23 49*  ALT 5 11  ALKPHOS 108 88  BILITOT 0.6 0.8  PROT 7.3 5.7*  ALBUMIN 2.6* 2.1*   No results for input(s): LIPASE, AMYLASE in the last 168 hours. Recent Labs  Lab 08/28/21 0835  AMMONIA 11   Coagulation profile Recent Labs  Lab 08/28/21 0647 09/02/21 0556  INR 1.2 1.2   COVID-19 Labs  Recent Labs    09/02/21 0556  DDIMER 2.47*  FERRITIN 250    Lab Results  Component Value Date   SARSCOV2NAA NEGATIVE 08/28/2021   SARSCOV2NAA NEGATIVE 08/05/2021   SARSCOV2NAA NEGATIVE 07/20/2021   SARSCOV2NAA NEGATIVE 07/10/2021    CBC: Recent Labs  Lab 08/28/21 0647 08/28/21 0806 08/30/21 0436 08/30/21 1740 08/31/21 0327 09/01/21 0250 09/01/21 0937 09/02/21 0556 09/02/21 1416 09/03/21 0417  WBC 6.7   < > 6.9  --  7.4 7.8  --   --  7.3 9.5  NEUTROABS 5.7  --  4.0  --   --   --   --   --   --   --   HGB 9.1*   < > 6.5*   < > 7.8* 7.1* 7.1*  --  7.2* 8.2*  HCT 31.3*   < > 21.8*   < > 24.7* 23.2*  21.0*  --  23.9* 26.6*  MCV 113.0*   < > 109.0*  --  101.2* 104.0*  --   --  104.4* 103.5*  PLT 84*   < > 54*  --  41* 41*  --  40* 44* 59*   < > = values in this interval not displayed.   Cardiac Enzymes: No results for input(s): CKTOTAL, CKMB, CKMBINDEX, TROPONINI in the last 168 hours. BNP (last 3 results) No results for input(s): PROBNP in the last 8760 hours. CBG: Recent Labs  Lab 09/02/21 1539 09/02/21 1927 09/02/21 2348 09/03/21 0333 09/03/21 0809  GLUCAP 108* 121* 119* 132* 117*   D-Dimer: Recent Labs    09/02/21 0556  DDIMER 2.47*   Hgb A1c: No results for input(s): HGBA1C in the last 72 hours. Lipid Profile: No results for input(s): CHOL, HDL, LDLCALC, TRIG, CHOLHDL, LDLDIRECT in the last 72 hours. Thyroid function studies: No results for input(s): TSH, T4TOTAL, T3FREE, THYROIDAB in the last 72 hours.  Invalid input(s): FREET3 Anemia work up: Recent Labs    09/02/21 0556  FERRITIN 250  TIBC 259  IRON 49   Sepsis Labs: Recent Labs  Lab 08/28/21 0647 08/28/21 0835 08/28/21 1205 08/29/21 0409 08/31/21 0327 09/01/21 0250 09/02/21 1416 09/03/21 0417  WBC 6.7 6.5  --    < > 7.4 7.8 7.3 9.5  LATICACIDVEN 1.1 2.6* 2.0*  --   --   --   --   --    < > = values in this interval not displayed.   Microbiology Recent Results (from the past 240 hour(s))  Resp Panel by RT-PCR (Flu A&B, Covid) Nasopharyngeal Swab     Status: None   Collection Time: 08/28/21  6:08 AM   Specimen: Nasopharyngeal Swab; Nasopharyngeal(NP) swabs in vial transport medium  Result Value Ref Range Status   SARS Coronavirus 2 by RT PCR NEGATIVE NEGATIVE Final    Comment: (NOTE) SARS-CoV-2 target nucleic acids are NOT DETECTED.  The SARS-CoV-2 RNA is generally detectable in upper respiratory specimens during the acute phase of infection. The lowest concentration of SARS-CoV-2 viral copies this assay can detect is 138 copies/mL. A negative result does not preclude  SARS-Cov-2 infection and should not be used as the sole basis for treatment or other patient management decisions. A negative result may occur with  improper specimen collection/handling, submission of specimen other than nasopharyngeal swab, presence of viral mutation(s) within the areas targeted by this assay, and inadequate number of viral copies(<138 copies/mL). A negative result must be combined with clinical observations, patient history, and epidemiological information. The expected result is Negative.  Fact Sheet for Patients:  BloggerCourse.com  Fact Sheet for Healthcare Providers:  SeriousBroker.it  This test is no t yet approved or cleared by the [place] FDA and  has been authorized for detection and/or diagnosis of SARS-CoV-2 by FDA under an Emergency Use Authorization (EUA). This EUA will remain  in effect (meaning this test can be used) for the duration of the COVID-19 declaration under Section 564(b)(1) of the Act, 21 U.S.C.section 360bbb-3(b)(1), unless the authorization is terminated  or revoked sooner.       Influenza A by PCR NEGATIVE NEGATIVE Final   Influenza B by PCR NEGATIVE NEGATIVE Final    Comment: (NOTE) The Xpert Xpress SARS-CoV-2/FLU/RSV plus assay is intended as an aid in the diagnosis of influenza from Nasopharyngeal swab specimens and should not be used as a sole basis for treatment. Nasal washings and aspirates are unacceptable for Xpert Xpress SARS-CoV-2/FLU/RSV testing.  Fact Sheet for Patients: BloggerCourse.com  Fact Sheet for Healthcare Providers: SeriousBroker.it  This test is not yet approved or cleared by the [place] FDA and has been authorized for detection and/or diagnosis of SARS-CoV-2 by FDA under an Emergency Use Authorization (EUA). This EUA will remain in effect (meaning this test can be used) for the duration of  the COVID-19 declaration under Section 564(b)(1) of the Act, 21 U.S.C. section 360bbb-3(b)(1), unless the authorization is terminated or revoked.  Performed at [facility], [address], [place], [place] [address]   Blood Culture (routine x 2)     Status: Abnormal   Collection Time: 08/28/21  6:13 AM   Specimen: BLOOD  Result Value Ref Range Status   Specimen Description BLOOD RIGHT ANTECUBITAL  Final   Special Requests   Final    BOTTLES DRAWN AEROBIC AND ANAEROBIC Blood Culture adequate volume   Culture  Setup Time   Final    GRAM NEGATIVE RODS AEROBIC BOTTLE ONLY GRAM POSITIVE COCCI CRITICAL RESULT CALLED TO, READ BACK BY AND VERIFIED WITH: PHARMD MICHAEL BITONTI 08/28/21@21 :45 BY TW IN BOTH AEROBIC AND ANAEROBIC BOTTLES    Culture (A)  Final    ACINETOBACTER CALCOACETICUS/BAUMANNII COMPLEX STAPHYLOCOCCUS HAEMOLYTICUS THE SIGNIFICANCE OF ISOLATING THIS ORGANISM FROM A SINGLE SET OF BLOOD CULTURES WHEN MULTIPLE SETS ARE DRAWN IS UNCERTAIN. PLEASE NOTIFY THE MICROBIOLOGY DEPARTMENT WITHIN ONE WEEK IF SPECIATION AND SENSITIVITIES ARE REQUIRED. Performed at [facility], [address] [place], [place] [address]    Report Status 08/31/2021 FINAL  Final   Organism ID, Bacteria ACINETOBACTER CALCOACETICUS/BAUMANNII COMPLEX  Final      Susceptibility   Acinetobacter calcoaceticus/baumannii complex - MIC*    CEFTAZIDIME >=64 RESISTANT Resistant     CIPROFLOXACIN >=4 RESISTANT Resistant     GENTAMICIN 4 SENSITIVE Sensitive     IMIPENEM 1 SENSITIVE Sensitive     PIP/TAZO >=128 RESISTANT Resistant     TRIMETH/SULFA <=20 SENSITIVE Sensitive     AMPICILLIN/SULBACTAM 4 SENSITIVE Sensitive     * ACINETOBACTER CALCOACETICUS/BAUMANNII COMPLEX  Blood Culture ID Panel (Reflexed)     Status: Abnormal   Collection Time: 08/28/21  6:13 AM  Result Value Ref Range Status   Enterococcus faecalis NOT DETECTED NOT DETECTED Final   Enterococcus Faecium NOT DETECTED NOT DETECTED  Final   Listeria monocytogenes NOT DETECTED NOT DETECTED Final   Staphylococcus species DETECTED (A) NOT DETECTED Final    Comment: RBV PHARMD MICHAEL BITONTI 08/28/21@21 :45 BY TW    Staphylococcus aureus (BCID) NOT DETECTED NOT DETECTED Final   Staphylococcus epidermidis NOT DETECTED NOT DETECTED Final   Staphylococcus lugdunensis NOT DETECTED NOT DETECTED Final   Streptococcus species NOT DETECTED NOT DETECTED Final   Streptococcus agalactiae NOT DETECTED NOT DETECTED Final   Streptococcus pneumoniae NOT DETECTED NOT DETECTED Final   Streptococcus pyogenes NOT DETECTED NOT DETECTED Final   A.calcoaceticus-baumannii DETECTED (A) NOT DETECTED Final    Comment: CRITICAL RESULT CALLED TO, READ BACK BY AND VERIFIED WITH: PHARMD MICHAEL BITONTI 08/28/21@21 :45 BY TW    Bacteroides fragilis NOT DETECTED NOT DETECTED Final   Enterobacterales NOT DETECTED NOT DETECTED Final   Enterobacter cloacae complex NOT DETECTED NOT DETECTED Final   Escherichia coli NOT DETECTED NOT DETECTED Final   Klebsiella aerogenes NOT DETECTED NOT DETECTED Final   Klebsiella oxytoca NOT DETECTED NOT DETECTED Final   Klebsiella pneumoniae NOT DETECTED NOT DETECTED Final   Proteus species NOT DETECTED NOT DETECTED Final   Salmonella species NOT DETECTED NOT DETECTED Final   Serratia marcescens NOT DETECTED NOT DETECTED Final   Haemophilus influenzae NOT DETECTED NOT DETECTED Final   Neisseria  meningitidis NOT DETECTED NOT DETECTED Final   Pseudomonas aeruginosa NOT DETECTED NOT DETECTED Final   Stenotrophomonas maltophilia NOT DETECTED NOT DETECTED Final   Candida albicans NOT DETECTED NOT DETECTED Final   Candida auris NOT DETECTED NOT DETECTED Final   Candida glabrata NOT DETECTED NOT DETECTED Final   Candida krusei NOT DETECTED NOT DETECTED Final   Candida parapsilosis NOT DETECTED NOT DETECTED Final   Candida tropicalis NOT DETECTED NOT DETECTED Final   Cryptococcus neoformans/gattii NOT DETECTED NOT  DETECTED Final   CTX-M ESBL NOT DETECTED NOT DETECTED Final   Carbapenem resistance IMP NOT DETECTED NOT DETECTED Final   Carbapenem resistance KPC NOT DETECTED NOT DETECTED Final   Carbapenem resistance NDM NOT DETECTED NOT DETECTED Final   Carbapenem resistance VIM NOT DETECTED NOT DETECTED Final    Comment: Performed at [facility], [address] [place], [place] [address]  Blood Culture (routine x 2)     Status: None   Collection Time: 08/28/21  6:55 AM   Specimen: BLOOD LEFT WRIST  Result Value Ref Range Status   Specimen Description BLOOD LEFT WRIST  Final   Special Requests   Final    BOTTLES DRAWN AEROBIC AND ANAEROBIC Blood Culture results may not be optimal due to an inadequate volume of blood received in culture bottles   Culture   Final    NO GROWTH 5 DAYS Performed at [facility], [address], [place], [place] [address]    Report Status 09/02/2021 FINAL  Final  Urine Culture     Status: Abnormal   Collection Time: 08/28/21 12:05 PM   Specimen: Urine, Clean Catch  Result Value Ref Range Status   Specimen Description URINE, CLEAN CATCH  Final   Special Requests   Final    NONE Performed at [facility], [address], [place], [place] [address]    Culture MULTIPLE SPECIES PRESENT, SUGGEST RECOLLECTION (A)  Final   Report Status 08/29/2021 FINAL  Final  MRSA Next Gen by PCR, Nasal     Status: Abnormal   Collection Time: 08/29/21  4:21 AM   Specimen: Nasal Mucosa; Nasal Swab  Result Value Ref Range Status   MRSA by PCR Next Gen DETECTED (A) NOT DETECTED Final    Comment: RESULT CALLED TO, READ BACK BY AND VERIFIED WITH: RN ALEXA LEWIS 08/29/21@5 :52 BY TW (NOTE) The GeneXpert MRSA Assay (FDA approved for NASAL specimens only), is one component of a comprehensive MRSA colonization surveillance program. It is not intended to diagnose MRSA infection nor to guide or monitor treatment for MRSA infections. Test performance is not FDA approved in  patients less than 21 years old. Performed at [facility], [address] [place], [place] [address]   Culture, blood (Routine X 2) w Reflex to ID Panel     Status: None   Collection Time: 08/29/21 12:20 PM   Specimen: BLOOD LEFT HAND  Result Value Ref Range Status   Specimen Description BLOOD LEFT HAND  Final   Special Requests   Final    BOTTLES DRAWN AEROBIC AND ANAEROBIC Blood Culture results may not be optimal due to an inadequate volume of blood received in culture bottles   Culture   Final    NO GROWTH 5 DAYS Performed at [facility], [address] [place], [place] [address]    Report Status 09/03/2021 FINAL  Final     Medications:    chlorhexidine  15 mL  Mouth Rinse BID   Chlorhexidine Gluconate Cloth  6 each Topical Daily   feeding supplement (PROSource TF)  45 mL Per Tube Daily   hydrocerin   Topical BID   hydrocortisone  10 mg Per Tube BID   mouth rinse  15 mL Mouth Rinse q12n4p   pantoprazole (PROTONIX) IV  40 mg Intravenous QHS   potassium chloride  20 mEq Per Tube Q4H   sodium chloride flush  10-40 mL Intracatheter Q12H   Continuous Infusions:  ampicillin-sulbactam (UNASYN) IV 3 g (09/03/21 0512)   dextrose 50 mL/hr at 09/03/21 0300   feeding supplement (JEVITY 1.5 CAL/FIBER) 1,500 mL (09/03/21 0500)   potassium chloride 10 mEq (09/03/21 0618)      LOS: 6 days   Marinda Elk  [facility]  09/03/2021, 8:22 AM

## 2021-09-03 NOTE — Progress Notes (Signed)
Peripherally Inserted Central Catheter Placement  The IV Nurse has discussed with the patient and/or persons authorized to consent for the patient, the purpose of this procedure and the potential benefits and risks involved with this procedure.  The benefits include less needle sticks, lab draws from the catheter, and the patient may be discharged home with the catheter. Risks include, but not limited to, infection, bleeding, blood clot (thrombus formation), and puncture of an artery; nerve damage and irregular heartbeat and possibility to perform a PICC exchange if needed/ordered by physician.  Alternatives to this procedure were also discussed.  Bard Power PICC patient education guide, fact sheet on infection prevention and patient information card has been provided to patient /or left at bedside. Telephone consent obtained via 2 RN's with daughter, Selena Batten.   PICC Placement Documentation  PICC Single Lumen 09/03/21 Right Basilic 43 cm 0 cm (Active)  Indication for Insertion or Continuance of Line Home intravenous therapies (PICC only) 09/03/21 1104  Exposed Catheter (cm) 0 cm 09/03/21 1104  Site Assessment Clean;Dry;Intact 09/03/21 1104  Line Status Flushed;Saline locked;Blood return noted 09/03/21 1104  Dressing Type Transparent;Securing device 09/03/21 1104  Dressing Status Clean;Dry;Intact 09/03/21 1104  Antimicrobial disc in place? Yes 09/03/21 1104  Safety Lock Not Applicable 09/03/21 1104  Line Care Connections checked and tightened 09/03/21 1104  Line Adjustment (NICU/IV Team Only) No 09/03/21 1104  Dressing Intervention New dressing 09/03/21 1104  Dressing Change Due 09/10/21 09/03/21 1104       Brach Birdsall 09/03/2021, 11:09 AM

## 2021-09-03 NOTE — Progress Notes (Signed)
    [facility]    Date of Admission:  08/28/2021   Total days of antibiotics 7/amp-sub           ID: Kristy [name] is a 75 y.o. female with acinetobacter bacteremia, presumed associated central line/skin issues Active Problems:   Sepsis (HCC)   Protein-calorie malnutrition, severe   Pressure injury of skin    Subjective: Afebrile.   Medications:   chlorhexidine  15 mL Mouth Rinse BID   Chlorhexidine Gluconate Cloth  6 each Topical Daily   feeding supplement (PROSource TF)  45 mL Per Tube Daily   hydrocerin   Topical BID   hydrocortisone  10 mg Per Tube BID   mouth rinse  15 mL Mouth Rinse q12n4p   potassium chloride  40 mEq Oral BID   sodium chloride flush  10-40 mL Intracatheter Q12H    Objective: Vital signs in last 24 hours: Temp:  [96.4 F (35.8 C)-98.8 F (37.1 C)] 98.8 F (37.1 C) (10/25 1700) Pulse Rate:  [77-99] 96 (10/25 1700) Resp:  [18-28] 24 (10/25 1700) BP: (137-168)/(70-108) 156/87 (10/25 1700) SpO2:  [95 %-100 %] 100 % (10/25 1700) Physical Exam  Constitutional:  oriented to person, appears chronic ill and mal-nourished. No distress.  HENT: [place]/AT, PERRLA, no scleral icterus Mouth/Throat: Oropharynx is clear and moist. No oropharyngeal exudate.  Cardiovascular: Normal rate, regular rhythm and normal heart sounds. Exam reveals no gallop and no friction rub.  No murmur heard.  Pulmonary/Chest: Effort normal and breath sounds normal. No respiratory distress.  has no wheezes.  Neck = supple, no nuchal rigidity Abdominal: Soft. Bowel sounds are normal.  exhibits no distension. There is no tenderness.  Lymphadenopathy: no cervical adenopathy. No axillary adenopathy Neurological: alert and oriented to person, place, and time.  Skin: Skin is warm and dry. No rash noted. No erythema.  Psychiatric: a normal mood and affect.  behavior is normal.    Lab Results Recent Labs    09/02/21 1416 09/03/21 0417  WBC 7.3 9.5  HGB 7.2*  8.2*  HCT 23.9* 26.6*  NA 142 142  K 3.8 3.3*  CL 108 104  CO2 26 29  BUN 22 19  CREATININE 0.91 0.77   Liver Panel No results for input(s): PROT, ALBUMIN, AST, ALT, ALKPHOS, BILITOT, BILIDIR, IBILI in the last 72 hours. Sedimentation Rate No results for input(s): ESRSEDRATE in the last 72 hours. C-Reactive Protein No results for input(s): CRP in the last 72 hours.  Microbiology: 10/19 blood cx - acinetobacter 10/20 blood cx - ngtd Studies/Results: [place] EKG SITE RITE  Result Date: 09/02/2021 If Site Rite image not attached, placement could not be confirmed due to current cardiac rhythm.  [place] EKG SITE RITE  Result Date: 09/02/2021 If Site Rite image not attached, placement could not be confirmed due to current cardiac rhythm.    Assessment/Plan: Acinetobacter bacteremia  = continue on amp/sub through end of hospitalization, when ready for discharge we can convert to oral bactrim to finish out course of treatment of 14 day (currently on day 5) and then can discontinue any need for picc line.   [facility] Pager: [phone]  09/03/2021, 8:33 PM

## 2021-09-03 NOTE — Progress Notes (Addendum)
Hypoglycemic Event  CBG: 64   Treatment: 8 oz juice/soda  Symptoms: None  Follow-up CBG: 85 Time:1158 CBG Result:  Possible Reasons for Event: Other: patient tube feeds held for PICC placement. Tube feeds now restarted. Juice given via tube and tube feeds restarted.   Comments/MD notified: Dr. Katrinka Blazing notified.     Mallie Darting

## 2021-09-04 DIAGNOSIS — R7881 Bacteremia: Secondary | ICD-10-CM

## 2021-09-04 DIAGNOSIS — L8992 Pressure ulcer of unspecified site, stage 2: Secondary | ICD-10-CM

## 2021-09-04 DIAGNOSIS — G931 Anoxic brain damage, not elsewhere classified: Secondary | ICD-10-CM

## 2021-09-04 DIAGNOSIS — J690 Pneumonitis due to inhalation of food and vomit: Secondary | ICD-10-CM

## 2021-09-04 DIAGNOSIS — G9341 Metabolic encephalopathy: Secondary | ICD-10-CM

## 2021-09-04 DIAGNOSIS — R1312 Dysphagia, oropharyngeal phase: Secondary | ICD-10-CM

## 2021-09-04 DIAGNOSIS — E162 Hypoglycemia, unspecified: Secondary | ICD-10-CM

## 2021-09-04 LAB — BASIC METABOLIC PANEL WITH GFR
Anion gap: 9 (ref 5–15)
BUN: 20 mg/dL (ref 8–23)
CO2: 29 mmol/L (ref 22–32)
Calcium: 9 mg/dL (ref 8.9–10.3)
Chloride: 104 mmol/L (ref 98–111)
Creatinine, Ser: 0.84 mg/dL (ref 0.44–1.00)
GFR, Estimated: >60 mL/min (ref >60)
Glucose, Bld: 115 mg/dL — ABNORMAL HIGH (ref 70–99)
Potassium: 3.7 mmol/L (ref 3.5–5.1)
Sodium: 142 mmol/L (ref 135–145)

## 2021-09-04 LAB — CBC
HCT: 27.1 % — ABNORMAL LOW (ref 36.0–46.0)
Hemoglobin: 8.7 g/dL — ABNORMAL LOW (ref 12.0–15.0)
MCH: 32.6 pg (ref 26.0–34.0)
MCHC: 32.1 g/dL (ref 30.0–36.0)
MCV: 101.5 fL — ABNORMAL HIGH (ref 80.0–100.0)
Platelets: 95 K/uL — ABNORMAL LOW (ref 150–400)
RBC: 2.67 MIL/uL — ABNORMAL LOW (ref 3.87–5.11)
RDW: 21.5 % — ABNORMAL HIGH (ref 11.5–15.5)
WBC: 9 K/uL (ref 4.0–10.5)
nRBC: 0 % (ref 0.0–0.2)

## 2021-09-04 LAB — GLUCOSE, CAPILLARY
Glucose-Capillary: 104 mg/dL — ABNORMAL HIGH (ref 70–99)
Glucose-Capillary: 114 mg/dL — ABNORMAL HIGH (ref 70–99)
Glucose-Capillary: 111 mg/dL — ABNORMAL HIGH (ref 70–99)
Glucose-Capillary: 109 mg/dL — ABNORMAL HIGH (ref 70–99)
Glucose-Capillary: 109 mg/dL — ABNORMAL HIGH (ref 70–99)

## 2021-09-04 LAB — PHOSPHORUS: Phosphorus: 2.9 mg/dL (ref 2.5–4.6)

## 2021-09-04 LAB — MAGNESIUM: Magnesium: 1.3 mg/dL — ABNORMAL LOW (ref 1.7–2.4)

## 2021-09-04 MED ORDER — HYDROCORTISONE 5 MG PO TABS
5.0000 mg | ORAL_TABLET | Freq: Two times a day (BID) | ORAL | Status: DC
  Administered 2021-09-04 – 2021-09-07 (×6): 5 mg
  Filled 2021-09-04 (×6): qty 1

## 2021-09-04 MED ORDER — ACETAMINOPHEN 325 MG PO TABS
650.0000 mg | ORAL_TABLET | Freq: Four times a day (QID) | ORAL | Status: DC | PRN
  Administered 2021-09-04 – 2021-09-14 (×3): 650 mg
  Filled 2021-09-04 (×3): qty 2

## 2021-09-04 MED ORDER — MAGNESIUM SULFATE 4 GM/100ML IV SOLN
4.0000 g | Freq: Once | INTRAVENOUS | Status: AC
  Administered 2021-09-04: 4 g via INTRAVENOUS
  Filled 2021-09-04: qty 100

## 2021-09-04 MED ORDER — DIPHENHYDRAMINE HCL 12.5 MG/5ML PO ELIX
12.5000 mg | ORAL_SOLUTION | Freq: Four times a day (QID) | ORAL | Status: DC | PRN
  Administered 2021-09-04 – 2021-09-17 (×10): 12.5 mg
  Filled 2021-09-04 (×9): qty 5
  Filled 2021-09-04 (×2): qty 10

## 2021-09-04 NOTE — Progress Notes (Signed)
Daily Progress Note   Patient Name: Kristy [name]       Date: 09/04/2021 DOB: [birth year]  Age: 75 y.o. MRN#: [id number] Attending Physician: Almon Hercules, MD Primary Care Physician: Darrow Bussing, MD Admit Date: 08/28/2021  Reason for Consultation/Follow-up: Establishing goals of care  Patient Profile/HPI:  75 y.o. 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 Shortly thereafter she cardiac arrested x 2 and was admitted to ICU. She was extubated on 10/23. She remains confused. Palliative medicine consulted for GOC.     Subjective: Kristy [name] is more awake and her speech is clearer today.  She is telling me that she forgot to bring the beer and asking me to go to the store and get it.  She is very weak. She is following some commands.  No family at bedside.   Review of Systems  Unable to perform ROS: Mental acuity    Physical Exam Vitals and nursing note reviewed.  Constitutional:      General: She is not in acute distress.    Appearance: She is ill-appearing.  Cardiovascular:     Rate and Rhythm: Normal rate.     Pulses: Normal pulses.  Pulmonary:     Effort: Pulmonary effort is normal.  Skin:    Comments: Diffuse anasarca, red scaly areas on arms, diffuse dry skin  Neurological:     Mental Status: She is disoriented.            Vital Signs: BP (!) 136/98   Pulse (!) 102   Temp 99.1 F (37.3 C)   Resp 20   Ht 5\' 6"  (1.676 m)   Wt 75 kg   SpO2 100%   BMI 26.69 kg/m  SpO2:  SpO2: 100 % O2 Device: O2 Device: Room Air O2 Flow Rate: O2 Flow Rate (L/min): 4 L/min  Intake/output summary:  Intake/Output Summary (Last 24 hours) at 09/04/2021 1457 Last data filed at 09/04/2021 1300 Gross per 24 hour  Intake 1959.89 ml  Output 4825 ml  Net -2865.11 ml    LBM: Last BM Date: 09/02/21 Baseline  Weight: Weight: 84.1 kg Most recent weight: Weight: 75 kg       Palliative Assessment/Data: PPS: 20%        Palliative Care Assessment & Plan    Assessment/Recommendations/Plan   Continue current plan of care PMT will follow for patient progress vs decline and discuss GOC prn Attempted to call patient's daughter for followup- no answer   Code Status: Full code  Prognosis:  Unable to determine  Discharge Planning: To Be Determined    Thank you for allowing the Palliative Medicine Team to assist in the care of this patient.  Total time:  20 mins Prolonged billing: No     Greater than 50%  of this time was spent counseling and coordinating care related to the above assessment and plan.  Kristy [name], AGNP-C Palliative Medicine   Please contact Palliative Medicine Team phone at [phone] for questions and concerns.

## 2021-09-04 NOTE — Progress Notes (Signed)
PROGRESS NOTE  Kristy [name] INO:[id number] DOB: [birth year]   PCP: Darrow Bussing, MD  Patient is from: SNF.  DOA: 08/28/2021 LOS: 7  Chief complaints:  Chief Complaint  Patient presents with   Altered Mental Status     Brief Narrative / Interim history: 75 y.o. frail elderly female with past medical history history significant alcohol abuse, admitted on 06/10/2021 for encephalopathy, and MRI of the cervical spine was done that revealed discitis and osteomyelitis neurosurgery was consulted recommended evacuation of abscess with anterior cervical discectomy C5-C6, she was treated with IV vancomycin and Rocephin, ID was consulted changed to cefazolin with rifampin, due to her poor oral intake and malnutrition PEG tube was placed on 07/11/2021 as well as a Foley catheter due to urinary retention, came back to the ED for altered mental status on 07/20/2021 MRI of the brain showed acute ischemic pontine infarct and imaging also showed concern of discitis and osteomyelitis T12-L1 neurosurgery was reconsulted recommended no further groin intervention, repeated MRI on 07/23/2021 showed no acute abnormal intracranial defects, ID was reconsulted on 07/24/2021 recommended to continue cefazolin and neurosurgery signed off.  Perative care was consulted on 07/26/2021 and family wanted full scope of treatment.  Discharge back to skilled nursing facility on 08/06/2021 [facility] on cefazolin until 08/25/2021 with a PICC line and was sent back to the emergency room for shortness of breath and altered mental status she rested in the ED responded to CPR in epic with cardioversion admitted to critical care due to evolving sepsis and acute respiratory failure possibly due to aspiration pneumonia with encephalopathy and transferred to [facility] on 09/03/2021  Subjective: Seen and examined earlier this morning.  No major events overnight of this morning.  No complaints but not a reliable historian.  She is only oriented to self and  "hospital".  Does not appear to be in distress.  Objective: Vitals:   09/04/21 1500 09/04/21 1600 09/04/21 1602 09/04/21 1700  BP: 139/75 (!) 140/107  135/73  Pulse: 94 (!) 102  93  Resp: 16 19  (!) 25  Temp: 99.1 F (37.3 C) 99 F (37.2 C) 98.8 F (37.1 C) 99.1 F (37.3 C)  TempSrc:  Bladder Bladder   SpO2: 100% 100%  100%  Weight:      Height:        Intake/Output Summary (Last 24 hours) at 09/04/2021 1800 Last data filed at 09/04/2021 1700 Gross per 24 hour  Intake 2330.06 ml  Output 3900 ml  Net -1569.94 ml   Filed Weights   09/01/21 0138 09/02/21 0450 09/04/21 0412  Weight: 78 kg 78 kg 75 kg    Examination:  GENERAL: No apparent distress.  Nontoxic. HEENT: MMM.  Vision and hearing grossly intact.  NECK: Supple.  No apparent JVD.  RESP: 100% on RA.  No IWOB.  Fair aeration bilaterally. CVS:  RRR. Heart sounds normal.  ABD/GI/GU: BS+. Abd soft, NTND.  MSK/EXT:  Moves extremities. No apparent deformity. No edema.  SKIN: Eczematous skin lesion NEURO: Awake.  Oriented to self and partial place.  No apparent focal neuro deficit but limited exam. PSYCH: Calm. Normal affect.   Procedures:  Intubation and extubation  Microbiology summarized: 08/27/2021 blood culture: Gram-positive cocci and Acinetobacter  Assessment & Plan: Acute respiratory failure with hypoxia possibly due to aspiration pneumonia s/p intubation secondary to cardiac arrest: Remains high risk for reintubation -Aspiration precaution   Gram-positive cocci/Acinetobacter bacteremia and septic shock: Sepsis physiology resolved.  Repeat cultures negative.  CT abdomen and  pelvis did not show source of infection. -ID rec-IV Unasyn in-house, and Bactrim on discharge to complete 14 days total from 10/19.   ATN due to sepsis: Resolved Recent Labs    08/05/21 0706 08/28/21 0647 08/28/21 0835 08/29/21 0409 08/30/21 0436 08/31/21 0327 09/01/21 0250 09/02/21 1416 09/03/21 0417 09/04/21 0410  BUN 31*  49* 46* 46* 36* 34* 26* 22 19 20   CREATININE 0.85 1.14* 1.11* 1.26* 1.25* 1.07* 0.88 0.91 0.77 0.84   Acute on chronic anemia/thrombocytopenia: She has a history of chronic alcohol abuse she is status post 1 unit of packed red blood cells on 08/30/2032. Also contributing to her anemia is likely due to her chronic repeated infection.   Acute metabolic encephalopathy: Concern for ABI after cardiac arrest -Reorientation and delirium precautions.  Chronic indwelling Foley catheter: Exchange on her recent admission. Urine cultures remain negative till date.  Persistent hypoglycemia: Started on Solu-Cortef.  Hypoglycemia resolved. -Wean Solu-Cortef to 5 mg twice daily   Acute CVA recent small pontine stroke: Not on AC or antiplatelet due to thrombocytopenia   Profound muscular deconditioning: Physical therapy to continue to work with her.   Goal of care counseling/headaches -Appreciate input by palliative care -Remains full code with full scope of care  Pruritic skin rash-appears eczematous on exam. -Try Benadryl instead of Atarax -Continue Eucerin cream  Hypomagnesemia: Mg 1.3. -IV magnesium sulfate 4 g x 1  Normocytic anemia: H&H stable. Recent Labs    08/28/21 0911 08/29/21 0409 08/30/21 0436 08/30/21 1740 08/31/21 0327 09/01/21 0250 09/01/21 0937 09/02/21 1416 09/03/21 0417 09/04/21 0410  HGB 9.5* 7.3* 6.5* 7.7* 7.8* 7.1* 7.1* 7.2* 8.2* 8.7*  -Continue monitoring  Thrombocytopenia: Improving. Recent Labs  Lab 08/29/21 0409 08/30/21 0436 08/31/21 0327 09/01/21 0250 09/02/21 0556 09/02/21 1416 09/03/21 0417 09/04/21 0410  PLT 72* 54* 41* 41* 40* 44* 59* 95*    Severe malnutrition/dysphagia: Body mass index is 26.69 kg/m. Nutrition Problem: Severe Malnutrition Etiology: acute illness (bacteremia) Signs/Symptoms: mild fat depletion, moderate muscle depletion, percent weight loss (11.7% weight loss in less than 2 months) Percent weight loss: 11.7  % Interventions: Tube feeding, Prostat  Pressure skin injury: POA Pressure Injury 08/28/21 Coccyx Right Stage 2 -  Partial thickness loss of dermis presenting as a shallow open injury with a red, pink wound bed without slough. (Active)  08/28/21 1030  Location: Coccyx  Location Orientation: Right  Staging: Stage 2 -  Partial thickness loss of dermis presenting as a shallow open injury with a red, pink wound bed without slough.  Wound Description (Comments):   Present on Admission: Yes     Pressure Injury 08/28/21 Sacrum Medial Stage 2 -  Partial thickness loss of dermis presenting as a shallow open injury with a red, pink wound bed without slough. (Active)  08/28/21 1030  Location: Sacrum  Location Orientation: Medial  Staging: Stage 2 -  Partial thickness loss of dermis presenting as a shallow open injury with a red, pink wound bed without slough.  Wound Description (Comments):   Present on Admission:      Pressure Injury 08/28/21 Buttocks Right Stage 2 -  Partial thickness loss of dermis presenting as a shallow open injury with a red, pink wound bed without slough. (Active)  08/28/21 1030  Location: Buttocks  Location Orientation: Right  Staging: Stage 2 -  Partial thickness loss of dermis presenting as a shallow open injury with a red, pink wound bed without slough.  Wound Description (Comments):   Present on Admission:  DVT prophylaxis:  Place and maintain sequential compression device Start: 08/31/21 1102 SCDs Start: 08/28/21 9629  Code Status: Full code Family Communication: Patient and/or RN. Available if any question.  Level of care: Progressive Status is: Inpatient  Remains inpatient appropriate because: IV antibiotics Ancef disposition       Consultants:  Palliative medicine Pulmonology   Sch Meds:  Scheduled Meds:  chlorhexidine  15 mL Mouth Rinse BID   Chlorhexidine Gluconate Cloth  6 each Topical Daily   feeding supplement (PROSource TF)  45 mL Per  Tube Daily   hydrocerin   Topical BID   hydrocortisone  5 mg Per Tube BID   mouth rinse  15 mL Mouth Rinse q12n4p   sodium chloride flush  10-40 mL Intracatheter Q12H   Continuous Infusions:  ampicillin-sulbactam (UNASYN) IV Stopped (09/04/21 1641)   feeding supplement (JEVITY 1.5 CAL/FIBER) 1,500 mL (09/04/21 1108)   PRN Meds:.acetaminophen, docusate, hydrOXYzine, [START ON 09/05/2021] influenza vaccine adjuvanted, melatonin, [START ON 09/05/2021] pneumococcal 23 valent vaccine, polyethylene glycol, sodium chloride flush  Antimicrobials: Anti-infectives (From admission, onward)    Start     Dose/Rate Route Frequency Ordered Stop   08/29/21 1200  vancomycin (VANCOREADY) IVPB 1250 mg/250 mL  Status:  Discontinued        1,250 mg 166.7 mL/hr over 90 Minutes Intravenous Every 24 hours 08/28/21 1113 08/29/21 0726   08/29/21 1200  vancomycin (VANCOCIN) IVPB 1000 mg/200 mL premix        1,000 mg 200 mL/hr over 60 Minutes Intravenous Every 24 hours 08/29/21 0726 09/01/21 1413   08/28/21 2300  Ampicillin-Sulbactam (UNASYN) 3 g in sodium chloride 0.9 % 100 mL IVPB        3 g 200 mL/hr over 30 Minutes Intravenous Every 6 hours 08/28/21 2200     08/28/21 1800  ceFEPIme (MAXIPIME) 2 g in sodium chloride 0.9 % 100 mL IVPB  Status:  Discontinued        2 g 200 mL/hr over 30 Minutes Intravenous Every 12 hours 08/28/21 1113 08/28/21 2200   08/28/21 1200  vancomycin (VANCOREADY) IVPB 1750 mg/350 mL        1,750 mg 175 mL/hr over 120 Minutes Intravenous  Once 08/28/21 1113 08/28/21 1348   08/28/21 0645  vancomycin (VANCOREADY) IVPB 1750 mg/350 mL  Status:  Discontinued        1,750 mg 175 mL/hr over 120 Minutes Intravenous  Once 08/28/21 0630 08/28/21 1139   08/28/21 0615  ceFEPIme (MAXIPIME) 2 g in sodium chloride 0.9 % 100 mL IVPB        2 g 200 mL/hr over 30 Minutes Intravenous  Once 08/28/21 0608 08/28/21 2045        I have personally reviewed the following labs and images: CBC: Recent  Labs  Lab 08/30/21 0436 08/30/21 1740 08/31/21 0327 09/01/21 0250 09/01/21 0937 09/02/21 0556 09/02/21 1416 09/03/21 0417 09/04/21 0410  WBC 6.9  --  7.4 7.8  --   --  7.3 9.5 9.0  NEUTROABS 4.0  --   --   --   --   --   --   --   --   HGB 6.5*   < > 7.8* 7.1* 7.1*  --  7.2* 8.2* 8.7*  HCT 21.8*   < > 24.7* 23.2* 21.0*  --  23.9* 26.6* 27.1*  MCV 109.0*  --  101.2* 104.0*  --   --  104.4* 103.5* 101.5*  PLT 54*  --  41* 41*  --  40* 44* 59* 95*   < > = values in this interval not displayed.   BMP &GFR Recent Labs  Lab 08/30/21 0436 08/31/21 0327 09/01/21 0250 09/01/21 0937 09/02/21 1416 09/03/21 0417 09/04/21 0410  NA 140 143 143 144 142 142 142  K 4.1 4.1 4.1 4.0 3.8 3.3* 3.7  CL 112* 113* 111  --  108 104 104  CO2 23 23 26   --  26 29 29   GLUCOSE 85 91 90  --  131* 121* 115*  BUN 36* 34* 26*  --  22 19 20   CREATININE 1.25* 1.07* 0.88  --  0.91 0.77 0.84  CALCIUM 8.8* 9.0 8.8*  --  8.7* 9.0 9.0  MG 2.3 2.0 1.6*  --  2.0 1.7 1.3*  PHOS 3.1 3.7 3.6  --   --  3.2 2.9   Estimated Creatinine Clearance: 59.9 mL/min (by C-G formula based on SCr of 0.84 mg/dL). Liver & Pancreas: Recent Labs  Lab 08/31/21 0327  AST 49*  ALT 11  ALKPHOS 88  BILITOT 0.8  PROT 5.7*  ALBUMIN 2.1*   No results for input(s): LIPASE, AMYLASE in the last 168 hours. No results for input(s): AMMONIA in the last 168 hours. Diabetic: No results for input(s): HGBA1C in the last 72 hours. Recent Labs  Lab 09/03/21 2321 09/04/21 0340 09/04/21 0756 09/04/21 1148 09/04/21 1559  GLUCAP 99 104* 114* 111* 109*   Cardiac Enzymes: No results for input(s): CKTOTAL, CKMB, CKMBINDEX, TROPONINI in the last 168 hours. No results for input(s): PROBNP in the last 8760 hours. Coagulation Profile: Recent Labs  Lab 09/02/21 0556  INR 1.2   Thyroid Function Tests: No results for input(s): TSH, T4TOTAL, FREET4, T3FREE, THYROIDAB in the last 72 hours. Lipid Profile: No results for input(s): CHOL,  HDL, LDLCALC, TRIG, CHOLHDL, LDLDIRECT in the last 72 hours. Anemia Panel: Recent Labs    09/02/21 0556  FERRITIN 250  TIBC 259  IRON 49   Urine analysis:    Component Value Date/Time   COLORURINE YELLOW 08/28/2021 1205   APPEARANCEUR CLEAR 08/28/2021 1205   LABSPEC 1.020 08/28/2021 1205   PHURINE 5.5 08/28/2021 1205   GLUCOSEU NEGATIVE 08/28/2021 1205   HGBUR SMALL (A) 08/28/2021 1205   BILIRUBINUR NEGATIVE 08/28/2021 1205   KETONESUR NEGATIVE 08/28/2021 1205   PROTEINUR NEGATIVE 08/28/2021 1205   UROBILINOGEN 0.2 05/14/2008 0020   NITRITE NEGATIVE 08/28/2021 1205   LEUKOCYTESUR SMALL (A) 08/28/2021 1205   Sepsis Labs: Invalid input(s): PROCALCITONIN, LACTICIDVEN  Microbiology: Recent Results (from the past 240 hour(s))  Resp Panel by RT-PCR (Flu A&B, Covid) Nasopharyngeal Swab     Status: None   Collection Time: 08/28/21  6:08 AM   Specimen: Nasopharyngeal Swab; Nasopharyngeal(NP) swabs in vial transport medium  Result Value Ref Range Status   SARS Coronavirus 2 by RT PCR NEGATIVE NEGATIVE Final    Comment: (NOTE) SARS-CoV-2 target nucleic acids are NOT DETECTED.  The SARS-CoV-2 RNA is generally detectable in upper respiratory specimens during the acute phase of infection. The lowest concentration of SARS-CoV-2 viral copies this assay can detect is 138 copies/mL. A negative result does not preclude SARS-Cov-2 infection and should not be used as the sole basis for treatment or other patient management decisions. A negative result may occur with  improper specimen collection/handling, submission of specimen other than nasopharyngeal swab, presence of viral mutation(s) within the areas targeted by this assay, and inadequate number of viral copies(<138 copies/mL). A negative result must be combined with clinical observations,  patient history, and epidemiological information. The expected result is Negative.  Fact Sheet for Patients:   BloggerCourse.com  Fact Sheet for Healthcare Providers:  SeriousBroker.it  This test is no t yet approved or cleared by the [place] FDA and  has been authorized for detection and/or diagnosis of SARS-CoV-2 by FDA under an Emergency Use Authorization (EUA). This EUA will remain  in effect (meaning this test can be used) for the duration of the COVID-19 declaration under Section 564(b)(1) of the Act, 21 U.S.C.section 360bbb-3(b)(1), unless the authorization is terminated  or revoked sooner.       Influenza A by PCR NEGATIVE NEGATIVE Final   Influenza B by PCR NEGATIVE NEGATIVE Final    Comment: (NOTE) The Xpert Xpress SARS-CoV-2/FLU/RSV plus assay is intended as an aid in the diagnosis of influenza from Nasopharyngeal swab specimens and should not be used as a sole basis for treatment. Nasal washings and aspirates are unacceptable for Xpert Xpress SARS-CoV-2/FLU/RSV testing.  Fact Sheet for Patients: BloggerCourse.com  Fact Sheet for Healthcare Providers: SeriousBroker.it  This test is not yet approved or cleared by the [place] FDA and has been authorized for detection and/or diagnosis of SARS-CoV-2 by FDA under an Emergency Use Authorization (EUA). This EUA will remain in effect (meaning this test can be used) for the duration of the COVID-19 declaration under Section 564(b)(1) of the Act, 21 U.S.C. section 360bbb-3(b)(1), unless the authorization is terminated or revoked.  Performed at [facility], [address] [place], [place] [address]   Blood Culture (routine x 2)     Status: Abnormal   Collection Time: 08/28/21  6:13 AM   Specimen: BLOOD  Result Value Ref Range Status   Specimen Description BLOOD RIGHT ANTECUBITAL  Final   Special Requests   Final    BOTTLES DRAWN AEROBIC AND ANAEROBIC Blood Culture adequate volume   Culture  Setup Time   Final     GRAM NEGATIVE RODS AEROBIC BOTTLE ONLY GRAM POSITIVE COCCI CRITICAL RESULT CALLED TO, READ BACK BY AND VERIFIED WITH: PHARMD MICHAEL BITONTI 08/28/21@21 :45 BY TW IN BOTH AEROBIC AND ANAEROBIC BOTTLES    Culture (A)  Final    ACINETOBACTER CALCOACETICUS/BAUMANNII COMPLEX STAPHYLOCOCCUS HAEMOLYTICUS THE SIGNIFICANCE OF ISOLATING THIS ORGANISM FROM A SINGLE SET OF BLOOD CULTURES WHEN MULTIPLE SETS ARE DRAWN IS UNCERTAIN. PLEASE NOTIFY THE MICROBIOLOGY DEPARTMENT WITHIN ONE WEEK IF SPECIATION AND SENSITIVITIES ARE REQUIRED. Performed at [facility], [address], [place], [place] [address]    Report Status 08/31/2021 FINAL  Final   Organism ID, Bacteria ACINETOBACTER CALCOACETICUS/BAUMANNII COMPLEX  Final      Susceptibility   Acinetobacter calcoaceticus/baumannii complex - MIC*    CEFTAZIDIME >=64 RESISTANT Resistant     CIPROFLOXACIN >=4 RESISTANT Resistant     GENTAMICIN 4 SENSITIVE Sensitive     IMIPENEM 1 SENSITIVE Sensitive     PIP/TAZO >=128 RESISTANT Resistant     TRIMETH/SULFA <=20 SENSITIVE Sensitive     AMPICILLIN/SULBACTAM 4 SENSITIVE Sensitive     * ACINETOBACTER CALCOACETICUS/BAUMANNII COMPLEX  Blood Culture ID Panel (Reflexed)     Status: Abnormal   Collection Time: 08/28/21  6:13 AM  Result Value Ref Range Status   Enterococcus faecalis NOT DETECTED NOT DETECTED Final   Enterococcus Faecium NOT DETECTED NOT DETECTED Final   Listeria monocytogenes NOT DETECTED NOT DETECTED Final   Staphylococcus species DETECTED (A) NOT DETECTED Final    Comment: RBV PHARMD MICHAEL BITONTI 08/28/21@21 :45 BY TW    Staphylococcus aureus (  BCID) NOT DETECTED NOT DETECTED Final   Staphylococcus epidermidis NOT DETECTED NOT DETECTED Final   Staphylococcus lugdunensis NOT DETECTED NOT DETECTED Final   Streptococcus species NOT DETECTED NOT DETECTED Final   Streptococcus agalactiae NOT DETECTED NOT DETECTED Final   Streptococcus pneumoniae NOT DETECTED NOT DETECTED Final    Streptococcus pyogenes NOT DETECTED NOT DETECTED Final   A.calcoaceticus-baumannii DETECTED (A) NOT DETECTED Final    Comment: CRITICAL RESULT CALLED TO, READ BACK BY AND VERIFIED WITH: PHARMD MICHAEL BITONTI 08/28/21@21 :45 BY TW    Bacteroides fragilis NOT DETECTED NOT DETECTED Final   Enterobacterales NOT DETECTED NOT DETECTED Final   Enterobacter cloacae complex NOT DETECTED NOT DETECTED Final   Escherichia coli NOT DETECTED NOT DETECTED Final   Klebsiella aerogenes NOT DETECTED NOT DETECTED Final   Klebsiella oxytoca NOT DETECTED NOT DETECTED Final   Klebsiella pneumoniae NOT DETECTED NOT DETECTED Final   Proteus species NOT DETECTED NOT DETECTED Final   Salmonella species NOT DETECTED NOT DETECTED Final   Serratia marcescens NOT DETECTED NOT DETECTED Final   Haemophilus influenzae NOT DETECTED NOT DETECTED Final   Neisseria meningitidis NOT DETECTED NOT DETECTED Final   Pseudomonas aeruginosa NOT DETECTED NOT DETECTED Final   Stenotrophomonas maltophilia NOT DETECTED NOT DETECTED Final   Candida albicans NOT DETECTED NOT DETECTED Final   Candida auris NOT DETECTED NOT DETECTED Final   Candida glabrata NOT DETECTED NOT DETECTED Final   Candida krusei NOT DETECTED NOT DETECTED Final   Candida parapsilosis NOT DETECTED NOT DETECTED Final   Candida tropicalis NOT DETECTED NOT DETECTED Final   Cryptococcus neoformans/gattii NOT DETECTED NOT DETECTED Final   CTX-M ESBL NOT DETECTED NOT DETECTED Final   Carbapenem resistance IMP NOT DETECTED NOT DETECTED Final   Carbapenem resistance KPC NOT DETECTED NOT DETECTED Final   Carbapenem resistance NDM NOT DETECTED NOT DETECTED Final   Carbapenem resistance VIM NOT DETECTED NOT DETECTED Final    Comment: Performed at [facility], [address], [place], [place] [address]  Blood Culture (routine x 2)     Status: None   Collection Time: 08/28/21  6:55 AM   Specimen: BLOOD LEFT WRIST  Result Value Ref Range Status   Specimen  Description BLOOD LEFT WRIST  Final   Special Requests   Final    BOTTLES DRAWN AEROBIC AND ANAEROBIC Blood Culture results may not be optimal due to an inadequate volume of blood received in culture bottles   Culture   Final    NO GROWTH 5 DAYS Performed at [facility], [address] [place], [place] [address]    Report Status 09/02/2021 FINAL  Final  Urine Culture     Status: Abnormal   Collection Time: 08/28/21 12:05 PM   Specimen: Urine, Clean Catch  Result Value Ref Range Status   Specimen Description URINE, CLEAN CATCH  Final   Special Requests   Final    NONE Performed at [facility], [address], [place], [place] [address]    Culture MULTIPLE SPECIES PRESENT, SUGGEST RECOLLECTION (A)  Final   Report Status 08/29/2021 FINAL  Final  MRSA Next Gen by PCR, Nasal     Status: Abnormal   Collection Time: 08/29/21  4:21 AM   Specimen: Nasal Mucosa; Nasal Swab  Result Value Ref Range Status   MRSA by PCR Next Gen DETECTED (A) NOT DETECTED Final    Comment: RESULT CALLED TO, READ BACK BY AND VERIFIED WITH: RN ALEXA LEWIS 08/29/21@5 :52 BY  TW (NOTE) The GeneXpert MRSA Assay (FDA approved for NASAL specimens only), is one component of a comprehensive MRSA colonization surveillance program. It is not intended to diagnose MRSA infection nor to guide or monitor treatment for MRSA infections. Test performance is not FDA approved in patients less than 15 years old. Performed at [facility], [address], [place], [place] [address]   Culture, blood (Routine X 2) w Reflex to ID Panel     Status: None   Collection Time: 08/29/21 12:20 PM   Specimen: BLOOD LEFT HAND  Result Value Ref Range Status   Specimen Description BLOOD LEFT HAND  Final   Special Requests   Final    BOTTLES DRAWN AEROBIC AND ANAEROBIC Blood Culture results may not be optimal due to an inadequate volume of blood received in culture bottles   Culture   Final    NO GROWTH 5 DAYS Performed  at [facility], [address] [place], [place] [address]    Report Status 09/03/2021 FINAL  Final    Radiology Studies: No results found.    Eddison Searls T. Kenzel Ruesch [facility]  If 7PM-7AM, please contact night-coverage [email or web address] 09/04/2021, 6:00 PM

## 2021-09-04 NOTE — Progress Notes (Signed)
    [facility]    Date of Admission:  08/28/2021   Total days of antibiotics 8          ID: Kristy [name] is a 75 y.o. female with  acinetobacter bacteremia Active Problems:   Sepsis (HCC)   Protein-calorie malnutrition, severe   Pressure injury of skin    Subjective: Fever of 100.86F this morning. No other new sources of infection identified. No leukocytosis. IJ pulled yesterday. Alert this morning.  Medications:   chlorhexidine  15 mL Mouth Rinse BID   Chlorhexidine Gluconate Cloth  6 each Topical Daily   feeding supplement (PROSource TF)  45 mL Per Tube Daily   hydrocerin   Topical BID   hydrocortisone  5 mg Per Tube BID   mouth rinse  15 mL Mouth Rinse q12n4p   sodium chloride flush  10-40 mL Intracatheter Q12H    Objective: Vital signs in last 24 hours: Temp:  [97.7 F (36.5 C)-100.2 F (37.9 C)] 99.1 F (37.3 C) (10/26 1700) Pulse Rate:  [93-109] 93 (10/26 1700) Resp:  [16-31] 25 (10/26 1700) BP: (98-166)/(60-110) 135/73 (10/26 1700) SpO2:  [91 %-100 %] 100 % (10/26 1700) Weight:  [75 kg] 75 kg (10/26 0412) Physical Exam  Constitutional:  oriented to person, place, and time. appears well-developed and well-nourished. No distress.  HENT: [place]/AT, PERRLA, no scleral icterus Mouth/Throat: Oropharynx is clear and moist. No oropharyngeal exudate.  Cardiovascular: Normal rate, regular rhythm and normal heart sounds. Exam reveals no gallop and no friction rub.  No murmur heard.  Pulmonary/Chest: Effort normal and breath sounds normal. No respiratory distress.  has no wheezes.  Neck = supple, no nuchal rigidity Abdominal: Soft. Bowel sounds are normal.  exhibits no distension. There is no tenderness.  Lymphadenopathy: no cervical adenopathy. No axillary adenopathy Neurological: alert and oriented to person, place, and time.  Skin: Skin is warm and dry. Erythroderma appearance of skin. Psychiatric: a normal mood and affect.  behavior is normal.     Lab Results Recent Labs    09/03/21 0417 09/04/21 0410  WBC 9.5 9.0  HGB 8.2* 8.7*  HCT 26.6* 27.1*  NA 142 142  K 3.3* 3.7  CL 104 104  CO2 29 29  BUN 19 20  CREATININE 0.77 0.84   Liver Panel No results for input(s): PROT, ALBUMIN, AST, ALT, ALKPHOS, BILITOT, BILIDIR, IBILI in the last 72 hours. Sedimentation Rate No results for input(s): ESRSEDRATE in the last 72 hours. C-Reactive Protein No results for input(s): CRP in the last 72 hours.  Microbiology: reviewed Studies/Results: [place] EKG SITE RITE  Result Date: 09/02/2021 If Site Rite image not attached, placement could not be confirmed due to current cardiac rhythm.    Assessment/Plan: Acinetobacter bacteremia = continue with amp/sub until discharge then convert to oral bactrim to finish out course and pull line  Fever = continue to monitor appears isolated, otherwise hemodynamically stable.  [facility] Pager: [phone]  09/04/2021, 5:59 PM

## 2021-09-04 NOTE — Progress Notes (Signed)
Previous RN attempted to call report at 1830 and was told by 2W charge nurse that no staff was available and that they would be in touch. Continuing to monitor patient.

## 2021-09-04 NOTE — Plan of Care (Signed)
  Problem: Clinical Measurements: Goal: Respiratory complications will improve Outcome: Progressing Goal: Cardiovascular complication will be avoided Outcome: Progressing   Problem: Nutrition: Goal: Adequate nutrition will be maintained Outcome: Progressing   Problem: Coping: Goal: Level of anxiety will decrease Outcome: Progressing   Problem: Pain Managment: Goal: General experience of comfort will improve Outcome: Progressing   Problem: Education: Goal: Knowledge of General Education information will improve Description: Including pain rating scale, medication(s)/side effects and non-pharmacologic comfort measures Outcome: Not Progressing   Problem: Health Behavior/Discharge Planning: Goal: Ability to manage health-related needs will improve Outcome: Not Progressing   Problem: Clinical Measurements: Goal: Ability to maintain clinical measurements within normal limits will improve Outcome: Not Progressing Goal: Will remain free from infection Outcome: Not Progressing Goal: Diagnostic test results will improve Outcome: Not Progressing   Problem: Activity: Goal: Risk for activity intolerance will decrease Outcome: Not Progressing   Problem: Elimination: Goal: Will not experience complications related to bowel motility Outcome: Not Progressing Goal: Will not experience complications related to urinary retention Outcome: Not Progressing   Problem: Safety: Goal: Ability to remain free from injury will improve Outcome: Not Progressing   Problem: Skin Integrity: Goal: Risk for impaired skin integrity will decrease Outcome: Not Progressing

## 2021-09-05 DIAGNOSIS — I959 Hypotension, unspecified: Secondary | ICD-10-CM

## 2021-09-05 DIAGNOSIS — E876 Hypokalemia: Secondary | ICD-10-CM

## 2021-09-05 LAB — GLUCOSE, CAPILLARY
Glucose-Capillary: 100 mg/dL — ABNORMAL HIGH (ref 70–99)
Glucose-Capillary: 100 mg/dL — ABNORMAL HIGH (ref 70–99)
Glucose-Capillary: 111 mg/dL — ABNORMAL HIGH (ref 70–99)
Glucose-Capillary: 112 mg/dL — ABNORMAL HIGH (ref 70–99)
Glucose-Capillary: 87 mg/dL (ref 70–99)
Glucose-Capillary: 95 mg/dL (ref 70–99)

## 2021-09-05 LAB — CBC
HCT: 26.4 % — ABNORMAL LOW (ref 36.0–46.0)
Hemoglobin: 8 g/dL — ABNORMAL LOW (ref 12.0–15.0)
MCH: 31.3 pg (ref 26.0–34.0)
MCHC: 30.3 g/dL (ref 30.0–36.0)
MCV: 103.1 fL — ABNORMAL HIGH (ref 80.0–100.0)
Platelets: 116 10*3/uL — ABNORMAL LOW (ref 150–400)
RBC: 2.56 MIL/uL — ABNORMAL LOW (ref 3.87–5.11)
RDW: 21 % — ABNORMAL HIGH (ref 11.5–15.5)
WBC: 10 10*3/uL (ref 4.0–10.5)
nRBC: 0 % (ref 0.0–0.2)

## 2021-09-05 LAB — RENAL FUNCTION PANEL
Albumin: 2.2 g/dL — ABNORMAL LOW (ref 3.5–5.0)
Anion gap: 5 (ref 5–15)
BUN: 26 mg/dL — ABNORMAL HIGH (ref 8–23)
CO2: 29 mmol/L (ref 22–32)
Calcium: 8.7 mg/dL — ABNORMAL LOW (ref 8.9–10.3)
Chloride: 108 mmol/L (ref 98–111)
Creatinine, Ser: 0.96 mg/dL (ref 0.44–1.00)
GFR, Estimated: >60 mL/min (ref >60)
Glucose, Bld: 125 mg/dL — ABNORMAL HIGH (ref 70–99)
Phosphorus: 4.1 mg/dL (ref 2.5–4.6)
Potassium: 3.4 mmol/L — ABNORMAL LOW (ref 3.5–5.1)
Sodium: 142 mmol/L (ref 135–145)

## 2021-09-05 LAB — MAGNESIUM: Magnesium: 2.2 mg/dL (ref 1.7–2.4)

## 2021-09-05 MED ORDER — THIAMINE HCL 100 MG/ML IJ SOLN
100.0000 mg | Freq: Every day | INTRAMUSCULAR | Status: DC
  Administered 2021-09-05 – 2021-09-13 (×9): 100 mg via INTRAVENOUS
  Filled 2021-09-05 (×9): qty 2

## 2021-09-05 MED ORDER — POTASSIUM CHLORIDE 20 MEQ PO PACK
40.0000 meq | PACK | ORAL | Status: AC
Start: 2021-09-05 — End: 2021-09-05
  Administered 2021-09-05 (×2): 40 meq via ORAL
  Filled 2021-09-05 (×2): qty 2

## 2021-09-05 MED ORDER — FLUCONAZOLE 150 MG PO TABS: 150.0000 mg | ORAL_TABLET | Freq: Every day | ORAL | Status: DC

## 2021-09-05 MED ORDER — ASCORBIC ACID 500 MG PO TABS
500.0000 mg | ORAL_TABLET | Freq: Every day | ORAL | Status: DC
  Administered 2021-09-05 – 2021-09-17 (×13): 500 mg
  Filled 2021-09-05 (×13): qty 1

## 2021-09-05 MED ORDER — FLUCONAZOLE 40 MG/ML PO SUSR
200.0000 mg | Freq: Once | ORAL | Status: AC
  Administered 2021-09-05: 200 mg
  Filled 2021-09-05: qty 5

## 2021-09-05 MED ORDER — MIDODRINE HCL 5 MG PO TABS
5.0000 mg | ORAL_TABLET | Freq: Three times a day (TID) | ORAL | Status: DC
  Administered 2021-09-05 – 2021-09-15 (×28): 5 mg
  Filled 2021-09-05 (×31): qty 1

## 2021-09-05 NOTE — Progress Notes (Signed)
PROGRESS NOTE  Kristy [name]:[id number] DOB: October 01, 1946   PCP: Darrow Bussing, MD  Patient is from: SNF.  DOA: 08/28/2021 LOS: 8  Chief complaints:  Chief Complaint  Patient presents with   Altered Mental Status     Brief Narrative / Interim history: 75 year old F with PMH of alcohol abuse, admitted on 06/10/2021 for encephalopathy, cervical spine discitis and osteomyelitis for which she underwent I&D with anterior cervical discectomy C5-C6, and treated with IV vancomycin and Rocephin, then cefazolin with rifampin, dysphagia and malnutrition requiring PEG tube on 07/11/2021 and urinary tension requiring Foley catheter.    Readmitted on 07/20/2021 with altered mental status on and found to have acute ischemic pontine infarct.  Patient was not on antiplatelet or anticoagulation due to thrombocytopenia and risk of bleeding.  Imaging also showed concern of discitis and osteomyelitis T12-L1 neurosurgery was reconsulted recommended no further surgical intervention.  ID was reconsulted on 07/24/2021 recommended to continue cefazolin.  Palliative care was consulted on 07/26/2021 and family wanted full scope of treatment.  Patient was discharged back to [facility] on 08/06/2021 on IV Ancef through 08/25/2021.    Patient was sent back to ED on 08/28/2021 for shortness of breath and AMS.  She had a cardiac arrest while in ED requiring CPR and epinephrine with cardioversion, and admitted to ICU with respiratory failure requiring intubation and mechanical ventilation in the setting of possible aspiration pneumonia and encephalopathy.    Patient was extubated and transferred to [facility] on 09/03/2021.   Subjective: Seen and examined earlier this morning.  No major events overnight of this morning.  Somewhat hypotensive this morning but she is sleeping.  Not tachycardic.  Does not appear to be in distress.  Has mild temp to 99.7.  His Solu-Cortef was decreased to 5 mg twice daily  yesterday.  Objective: Vitals:   09/05/21 0744 09/05/21 0816 09/05/21 1000 09/05/21 1127  BP: (!) 109/53  (!) 86/45 (!) 94/41  Pulse: (!) 105   100  Resp:    (!) 24  Temp:    99.7 F (37.6 C)  TempSrc:    Oral  SpO2: 99%   99%  Weight:  77.6 kg    Height:        Intake/Output Summary (Last 24 hours) at 09/05/2021 1248 Last data filed at 09/05/2021 0500 Gross per 24 hour  Intake 1310.17 ml  Output 750 ml  Net 560.17 ml   Filed Weights   09/02/21 0450 09/04/21 0412 09/05/21 0816  Weight: 78 kg 75 kg 77.6 kg    Examination:  GENERAL: Chronically ill-appearing.  No apparent distress. HEENT: MMM.  Vision and hearing grossly intact.  NECK: Supple.  No apparent JVD.  RESP: 99% on RA.  No IWOB.  Fair aeration bilaterally. CVS:  RRR. Heart sounds normal.  ABD/GI/GU: BS+. Abd soft, NTND.  On TF via G-tube. MSK/EXT:  Moves extremities. No apparent deformity. No edema.  SKIN: Glistening skin in face, neck, extremities NEURO: Sleepy but wakes to voice.  Oriented to self and "hospital".  No apparent focal neuro deficit but limited exam PSYCH: Calm. Normal affect.   Procedures:  Intubation and extubation  Microbiology summarized: 08/27/2021 blood culture: Gram-positive cocci and Acinetobacter  Assessment & Plan: Acute respiratory failure with hypoxia possibly due to aspiration pneumonia s/p intubation secondary to cardiac arrest: Resolved but remains high risk for reintubation -Aspiration precaution   Gram-positive cocci/Acinetobacter bacteremia and septic shock: Sepsis physiology resolved.  Repeat cultures negative.  CT abdomen  and pelvis did not show source of infection. -ID rec-IV Unasyn in-house, and Bactrim on discharge to complete 14 days total from 10/19.   ATN due to sepsis: Resolved but creatinine slightly higher today Recent Labs    08/28/21 0647 08/28/21 0835 08/29/21 0409 08/30/21 0436 08/31/21 0327 09/01/21 0250 09/02/21 1416 09/03/21 0417 09/04/21 0410  09/05/21 0224  BUN 49* 46* 46* 36* 34* 26* 22 19 20  26*  CREATININE 1.14* 1.11* 1.26* 1.25* 1.07* 0.88 0.91 0.77 0.84 0.96   Acute on chronic anemia of chronic:H&H stable after 1 unit on 10/21.   Thrombocytopenia: Improved. Recent Labs    08/29/21 0409 08/30/21 0436 08/30/21 1740 08/31/21 0327 09/01/21 0250 09/01/21 0937 09/02/21 1416 09/03/21 0417 09/04/21 0410 09/05/21 0224  HGB 7.3* 6.5* 7.7* 7.8* 7.1* 7.1* 7.2* 8.2* 8.7* 8.0*  -Monitor intermittently   Acute metabolic encephalopathy: Concern for ABI after cardiac arrest -Reorientation and delirium precautions.  Chronic indwelling Foley catheter: Exchange on her recent admission. Urine cultures NGTD. -Continue indwelling Foley  Persistent hypoglycemia: Started on Solu-Cortef.  Hypoglycemia resolved. -Weaned to Solu-Cortef to 5 mg twice daily on 10/26   Acute CVA recent small pontine stroke: Not on [facility] or antiplatelet due to thrombocytopenia -May consider starting aspirin or Plavix now thrombocytopenia has improved   Profound muscular deconditioning: -Continue physical therapy to continue to work with her.  Hypotension: Multiple potential causes including sepsis, adrenal insufficiency and autonomic dysregulation.  She has mild temp to 99.7.  However, she is already on Unasyn.  She has no leukocytosis either.  We also reviewed Solu-Cortef to 5 mg twice daily yesterday. -Start midodrine 5 mg 3 times daily -Continue monitoring   Goal of care counseling/headaches -Appreciate input by palliative care -Remains full code with full scope of care  Pruritic skin rash-appears to have glistening skin in her face, neck and extremities. -Check B1 and vitamin C level -Thiamine and vitamin C supplementation -Try Benadryl instead of Atarax -Continue Eucerin cream  Hypomagnesemia/hypokalemia: Hypomagnesemia resolved.  K3.4. -KCl 40x2  Normocytic anemia: H&H stable. Recent Labs    08/29/21 0409 08/30/21 0436 08/30/21 1740  08/31/21 0327 09/01/21 0250 09/01/21 0937 09/02/21 1416 09/03/21 0417 09/04/21 0410 09/05/21 0224  HGB 7.3* 6.5* 7.7* 7.8* 7.1* 7.1* 7.2* 8.2* 8.7* 8.0*  -Continue monitoring  Thrombocytopenia: Improving. Recent Labs  Lab 08/30/21 0436 08/31/21 0327 09/01/21 0250 09/02/21 0556 09/02/21 1416 09/03/21 0417 09/04/21 0410 09/05/21 0224  PLT 54* 41* 41* 40* 44* 59* 95* 116*    Severe malnutrition/dysphagia: Body mass index is 27.61 kg/m. Nutrition Problem: Severe Malnutrition Etiology: acute illness (bacteremia) Signs/Symptoms: mild fat depletion, moderate muscle depletion, percent weight loss (11.7% weight loss in less than 2 months) Percent weight loss: 11.7 % Interventions: Tube feeding, Prostat  Pressure skin injury: POA Pressure Injury 08/28/21 Coccyx Right Stage 2 -  Partial thickness loss of dermis presenting as a shallow open injury with a red, pink wound bed without slough. (Active)  08/28/21 1030  Location: Coccyx  Location Orientation: Right  Staging: Stage 2 -  Partial thickness loss of dermis presenting as a shallow open injury with a red, pink wound bed without slough.  Wound Description (Comments):   Present on Admission: Yes     Pressure Injury 08/28/21 Sacrum Medial Stage 2 -  Partial thickness loss of dermis presenting as a shallow open injury with a red, pink wound bed without slough. (Active)  08/28/21 1030  Location: Sacrum  Location Orientation: Medial  Staging: Stage 2 -  Partial thickness  loss of dermis presenting as a shallow open injury with a red, pink wound bed without slough.  Wound Description (Comments):   Present on Admission:      Pressure Injury 08/28/21 Buttocks Right Stage 2 -  Partial thickness loss of dermis presenting as a shallow open injury with a red, pink wound bed without slough. (Active)  08/28/21 1030  Location: Buttocks  Location Orientation: Right  Staging: Stage 2 -  Partial thickness loss of dermis presenting as a  shallow open injury with a red, pink wound bed without slough.  Wound Description (Comments):   Present on Admission:    DVT prophylaxis:  Place and maintain sequential compression device Start: 08/31/21 1102 SCDs Start: 08/28/21 4098  Code Status: Full code Family Communication: Patient and/or RN. Available if any question.  Level of care: Progressive Status is: Inpatient  Remains inpatient appropriate because: Hemodynamically unstable (hypotension), IV antibiotics Ancef and disposition       Consultants:  Palliative medicine Pulmonology Infectious disease   Sch Meds:  Scheduled Meds:  chlorhexidine  15 mL Mouth Rinse BID   Chlorhexidine Gluconate Cloth  6 each Topical Daily   feeding supplement (PROSource TF)  45 mL Per Tube Daily   hydrocerin   Topical BID   hydrocortisone  5 mg Per Tube BID   mouth rinse  15 mL Mouth Rinse q12n4p   midodrine  5 mg Per Tube TID WC   potassium chloride  40 mEq Oral Q4H   sodium chloride flush  10-40 mL Intracatheter Q12H   Continuous Infusions:  ampicillin-sulbactam (UNASYN) IV 3 g (09/05/21 1039)   feeding supplement (JEVITY 1.5 CAL/FIBER) 1,500 mL (09/04/21 1108)   PRN Meds:.acetaminophen, diphenhydrAMINE, docusate, influenza vaccine adjuvanted, melatonin, pneumococcal 23 valent vaccine, polyethylene glycol, sodium chloride flush  Antimicrobials: Anti-infectives (From admission, onward)    Start     Dose/Rate Route Frequency Ordered Stop   08/29/21 1200  vancomycin (VANCOREADY) IVPB 1250 mg/250 mL  Status:  Discontinued        1,250 mg 166.7 mL/hr over 90 Minutes Intravenous Every 24 hours 08/28/21 1113 08/29/21 0726   08/29/21 1200  vancomycin (VANCOCIN) IVPB 1000 mg/200 mL premix        1,000 mg 200 mL/hr over 60 Minutes Intravenous Every 24 hours 08/29/21 0726 09/01/21 1413   08/28/21 2300  Ampicillin-Sulbactam (UNASYN) 3 g in sodium chloride 0.9 % 100 mL IVPB        3 g 200 mL/hr over 30 Minutes Intravenous Every 6  hours 08/28/21 2200 09/12/21 2359   08/28/21 1800  ceFEPIme (MAXIPIME) 2 g in sodium chloride 0.9 % 100 mL IVPB  Status:  Discontinued        2 g 200 mL/hr over 30 Minutes Intravenous Every 12 hours 08/28/21 1113 08/28/21 2200   08/28/21 1200  vancomycin (VANCOREADY) IVPB 1750 mg/350 mL        1,750 mg 175 mL/hr over 120 Minutes Intravenous  Once 08/28/21 1113 08/28/21 1348   08/28/21 0645  vancomycin (VANCOREADY) IVPB 1750 mg/350 mL  Status:  Discontinued        1,750 mg 175 mL/hr over 120 Minutes Intravenous  Once 08/28/21 0630 08/28/21 1139   08/28/21 0615  ceFEPIme (MAXIPIME) 2 g in sodium chloride 0.9 % 100 mL IVPB        2 g 200 mL/hr over 30 Minutes Intravenous  Once 08/28/21 0608 08/28/21 2045        I have personally reviewed the following labs and  images: CBC: Recent Labs  Lab 08/30/21 0436 08/30/21 1740 09/01/21 0250 09/01/21 0937 09/02/21 0556 09/02/21 1416 09/03/21 0417 09/04/21 0410 09/05/21 0224  WBC 6.9   < > 7.8  --   --  7.3 9.5 9.0 10.0  NEUTROABS 4.0  --   --   --   --   --   --   --   --   HGB 6.5*   < > 7.1* 7.1*  --  7.2* 8.2* 8.7* 8.0*  HCT 21.8*   < > 23.2* 21.0*  --  23.9* 26.6* 27.1* 26.4*  MCV 109.0*   < > 104.0*  --   --  104.4* 103.5* 101.5* 103.1*  PLT 54*   < > 41*  --  40* 44* 59* 95* 116*   < > = values in this interval not displayed.   BMP &GFR Recent Labs  Lab 08/31/21 0327 09/01/21 0250 09/01/21 0937 09/02/21 1416 09/03/21 0417 09/04/21 0410 09/05/21 0224  NA 143 143 144 142 142 142 142  K 4.1 4.1 4.0 3.8 3.3* 3.7 3.4*  CL 113* 111  --  108 104 104 108  CO2 23 26  --  26 29 29 29   GLUCOSE 91 90  --  131* 121* 115* 125*  BUN 34* 26*  --  22 19 20  26*  CREATININE 1.07* 0.88  --  0.91 0.77 0.84 0.96  CALCIUM 9.0 8.8*  --  8.7* 9.0 9.0 8.7*  MG 2.0 1.6*  --  2.0 1.7 1.3* 2.2  PHOS 3.7 3.6  --   --  3.2 2.9 4.1   Estimated Creatinine Clearance: 53.2 mL/min (by C-G formula based on SCr of 0.96 mg/dL). Liver &  Pancreas: Recent Labs  Lab 08/31/21 0327 09/05/21 0224  AST 49*  --   ALT 11  --   ALKPHOS 88  --   BILITOT 0.8  --   PROT 5.7*  --   ALBUMIN 2.1* 2.2*   No results for input(s): LIPASE, AMYLASE in the last 168 hours. No results for input(s): AMMONIA in the last 168 hours. Diabetic: No results for input(s): HGBA1C in the last 72 hours. Recent Labs  Lab 09/04/21 1949 09/05/21 0010 09/05/21 0515 09/05/21 0730 09/05/21 1126  GLUCAP 109* 100* 95 111* 100*   Cardiac Enzymes: No results for input(s): CKTOTAL, CKMB, CKMBINDEX, TROPONINI in the last 168 hours. No results for input(s): PROBNP in the last 8760 hours. Coagulation Profile: Recent Labs  Lab 09/02/21 0556  INR 1.2   Thyroid Function Tests: No results for input(s): TSH, T4TOTAL, FREET4, T3FREE, THYROIDAB in the last 72 hours. Lipid Profile: No results for input(s): CHOL, HDL, LDLCALC, TRIG, CHOLHDL, LDLDIRECT in the last 72 hours. Anemia Panel: No results for input(s): VITAMINB12, FOLATE, FERRITIN, TIBC, IRON, RETICCTPCT in the last 72 hours.  Urine analysis:    Component Value Date/Time   COLORURINE YELLOW 08/28/2021 1205   APPEARANCEUR CLEAR 08/28/2021 1205   LABSPEC 1.020 08/28/2021 1205   PHURINE 5.5 08/28/2021 1205   GLUCOSEU NEGATIVE 08/28/2021 1205   HGBUR SMALL (A) 08/28/2021 1205   BILIRUBINUR NEGATIVE 08/28/2021 1205   KETONESUR NEGATIVE 08/28/2021 1205   PROTEINUR NEGATIVE 08/28/2021 1205   UROBILINOGEN 0.2 05/14/2008 0020   NITRITE NEGATIVE 08/28/2021 1205   LEUKOCYTESUR SMALL (A) 08/28/2021 1205   Sepsis Labs: Invalid input(s): PROCALCITONIN, LACTICIDVEN  Microbiology: Recent Results (from the past 240 hour(s))  Resp Panel by RT-PCR (Flu A&B, Covid) Nasopharyngeal Swab     Status: None  Collection Time: 08/28/21  6:08 AM   Specimen: Nasopharyngeal Swab; Nasopharyngeal(NP) swabs in vial transport medium  Result Value Ref Range Status   SARS Coronavirus 2 by RT PCR NEGATIVE NEGATIVE  Final    Comment: (NOTE) SARS-CoV-2 target nucleic acids are NOT DETECTED.  The SARS-CoV-2 RNA is generally detectable in upper respiratory specimens during the acute phase of infection. The lowest concentration of SARS-CoV-2 viral copies this assay can detect is 138 copies/mL. A negative result does not preclude SARS-Cov-2 infection and should not be used as the sole basis for treatment or other patient management decisions. A negative result may occur with  improper specimen collection/handling, submission of specimen other than nasopharyngeal swab, presence of viral mutation(s) within the areas targeted by this assay, and inadequate number of viral copies(<138 copies/mL). A negative result must be combined with clinical observations, patient history, and epidemiological information. The expected result is Negative.  Fact Sheet for Patients:  BloggerCourse.com  Fact Sheet for Healthcare Providers:  SeriousBroker.it  This test is no t yet approved or cleared by the [place] FDA and  has been authorized for detection and/or diagnosis of SARS-CoV-2 by FDA under an Emergency Use Authorization (EUA). This EUA will remain  in effect (meaning this test can be used) for the duration of the COVID-19 declaration under Section 564(b)(1) of the Act, 21 U.S.C.section 360bbb-3(b)(1), unless the authorization is terminated  or revoked sooner.       Influenza A by PCR NEGATIVE NEGATIVE Final   Influenza B by PCR NEGATIVE NEGATIVE Final    Comment: (NOTE) The Xpert Xpress SARS-CoV-2/FLU/RSV plus assay is intended as an aid in the diagnosis of influenza from Nasopharyngeal swab specimens and should not be used as a sole basis for treatment. Nasal washings and aspirates are unacceptable for Xpert Xpress SARS-CoV-2/FLU/RSV testing.  Fact Sheet for Patients: BloggerCourse.com  Fact Sheet for Healthcare  Providers: SeriousBroker.it  This test is not yet approved or cleared by the [place] FDA and has been authorized for detection and/or diagnosis of SARS-CoV-2 by FDA under an Emergency Use Authorization (EUA). This EUA will remain in effect (meaning this test can be used) for the duration of the COVID-19 declaration under Section 564(b)(1) of the Act, 21 U.S.C. section 360bbb-3(b)(1), unless the authorization is terminated or revoked.  Performed at [facility], [address], [place], [place] [address]   Blood Culture (routine x 2)     Status: Abnormal   Collection Time: 08/28/21  6:13 AM   Specimen: BLOOD  Result Value Ref Range Status   Specimen Description BLOOD RIGHT ANTECUBITAL  Final   Special Requests   Final    BOTTLES DRAWN AEROBIC AND ANAEROBIC Blood Culture adequate volume   Culture  Setup Time   Final    GRAM NEGATIVE RODS AEROBIC BOTTLE ONLY GRAM POSITIVE COCCI CRITICAL RESULT CALLED TO, READ BACK BY AND VERIFIED WITH: PHARMD MICHAEL BITONTI 08/28/21@21 :45 BY TW IN BOTH AEROBIC AND ANAEROBIC BOTTLES    Culture (A)  Final    ACINETOBACTER CALCOACETICUS/BAUMANNII COMPLEX STAPHYLOCOCCUS HAEMOLYTICUS THE SIGNIFICANCE OF ISOLATING THIS ORGANISM FROM A SINGLE SET OF BLOOD CULTURES WHEN MULTIPLE SETS ARE DRAWN IS UNCERTAIN. PLEASE NOTIFY THE MICROBIOLOGY DEPARTMENT WITHIN ONE WEEK IF SPECIATION AND SENSITIVITIES ARE REQUIRED. Performed at [facility], [address], [place], [place] [address]    Report Status 08/31/2021 FINAL  Final   Organism ID, Bacteria ACINETOBACTER CALCOACETICUS/BAUMANNII COMPLEX  Final      Susceptibility   Acinetobacter  calcoaceticus/baumannii complex - MIC*    CEFTAZIDIME >=64 RESISTANT Resistant     CIPROFLOXACIN >=4 RESISTANT Resistant     GENTAMICIN 4 SENSITIVE Sensitive     IMIPENEM 1 SENSITIVE Sensitive     PIP/TAZO >=128 RESISTANT Resistant     TRIMETH/SULFA <=20 SENSITIVE Sensitive      AMPICILLIN/SULBACTAM 4 SENSITIVE Sensitive     * ACINETOBACTER CALCOACETICUS/BAUMANNII COMPLEX  Blood Culture ID Panel (Reflexed)     Status: Abnormal   Collection Time: 08/28/21  6:13 AM  Result Value Ref Range Status   Enterococcus faecalis NOT DETECTED NOT DETECTED Final   Enterococcus Faecium NOT DETECTED NOT DETECTED Final   Listeria monocytogenes NOT DETECTED NOT DETECTED Final   Staphylococcus species DETECTED (A) NOT DETECTED Final    Comment: RBV PHARMD MICHAEL BITONTI 08/28/21@21 :45 BY TW    Staphylococcus aureus (BCID) NOT DETECTED NOT DETECTED Final   Staphylococcus epidermidis NOT DETECTED NOT DETECTED Final   Staphylococcus lugdunensis NOT DETECTED NOT DETECTED Final   Streptococcus species NOT DETECTED NOT DETECTED Final   Streptococcus agalactiae NOT DETECTED NOT DETECTED Final   Streptococcus pneumoniae NOT DETECTED NOT DETECTED Final   Streptococcus pyogenes NOT DETECTED NOT DETECTED Final   A.calcoaceticus-baumannii DETECTED (A) NOT DETECTED Final    Comment: CRITICAL RESULT CALLED TO, READ BACK BY AND VERIFIED WITH: PHARMD MICHAEL BITONTI 08/28/21@21 :45 BY TW    Bacteroides fragilis NOT DETECTED NOT DETECTED Final   Enterobacterales NOT DETECTED NOT DETECTED Final   Enterobacter cloacae complex NOT DETECTED NOT DETECTED Final   Escherichia coli NOT DETECTED NOT DETECTED Final   Klebsiella aerogenes NOT DETECTED NOT DETECTED Final   Klebsiella oxytoca NOT DETECTED NOT DETECTED Final   Klebsiella pneumoniae NOT DETECTED NOT DETECTED Final   Proteus species NOT DETECTED NOT DETECTED Final   Salmonella species NOT DETECTED NOT DETECTED Final   Serratia marcescens NOT DETECTED NOT DETECTED Final   Haemophilus influenzae NOT DETECTED NOT DETECTED Final   Neisseria meningitidis NOT DETECTED NOT DETECTED Final   Pseudomonas aeruginosa NOT DETECTED NOT DETECTED Final   Stenotrophomonas maltophilia NOT DETECTED NOT DETECTED Final   Candida albicans NOT DETECTED NOT  DETECTED Final   Candida auris NOT DETECTED NOT DETECTED Final   Candida glabrata NOT DETECTED NOT DETECTED Final   Candida krusei NOT DETECTED NOT DETECTED Final   Candida parapsilosis NOT DETECTED NOT DETECTED Final   Candida tropicalis NOT DETECTED NOT DETECTED Final   Cryptococcus neoformans/gattii NOT DETECTED NOT DETECTED Final   CTX-M ESBL NOT DETECTED NOT DETECTED Final   Carbapenem resistance IMP NOT DETECTED NOT DETECTED Final   Carbapenem resistance KPC NOT DETECTED NOT DETECTED Final   Carbapenem resistance NDM NOT DETECTED NOT DETECTED Final   Carbapenem resistance VIM NOT DETECTED NOT DETECTED Final    Comment: Performed at [facility], [address] [place], [place] [address]  Blood Culture (routine x 2)     Status: None   Collection Time: 08/28/21  6:55 AM   Specimen: BLOOD LEFT WRIST  Result Value Ref Range Status   Specimen Description BLOOD LEFT WRIST  Final   Special Requests   Final    BOTTLES DRAWN AEROBIC AND ANAEROBIC Blood Culture results may not be optimal due to an inadequate volume of blood received in culture bottles   Culture   Final    NO GROWTH 5 DAYS Performed at [facility], [address] [place], [place] [address]    Report Status 09/02/2021 FINAL  Final  Urine Culture     Status: Abnormal   Collection Time: 08/28/21 12:05 PM   Specimen: Urine, Clean Catch  Result Value Ref Range Status   Specimen Description URINE, CLEAN CATCH  Final   Special Requests   Final    NONE Performed at [facility], [address], [place], [place] [address]    Culture MULTIPLE SPECIES PRESENT, SUGGEST RECOLLECTION (A)  Final   Report Status 08/29/2021 FINAL  Final  MRSA Next Gen by PCR, Nasal     Status: Abnormal   Collection Time: 08/29/21  4:21 AM   Specimen: Nasal Mucosa; Nasal Swab  Result Value Ref Range Status   MRSA by PCR Next Gen DETECTED (A) NOT DETECTED Final    Comment: RESULT CALLED TO, READ BACK BY AND VERIFIED WITH: RN  ALEXA LEWIS 08/29/21@5 :52 BY TW (NOTE) The GeneXpert MRSA Assay (FDA approved for NASAL specimens only), is one component of a comprehensive MRSA colonization surveillance program. It is not intended to diagnose MRSA infection nor to guide or monitor treatment for MRSA infections. Test performance is not FDA approved in patients less than 29 years old. Performed at [facility], [address] [place], [place] [address]   Culture, blood (Routine X 2) w Reflex to ID Panel     Status: None   Collection Time: 08/29/21 12:20 PM   Specimen: BLOOD LEFT HAND  Result Value Ref Range Status   Specimen Description BLOOD LEFT HAND  Final   Special Requests   Final    BOTTLES DRAWN AEROBIC AND ANAEROBIC Blood Culture results may not be optimal due to an inadequate volume of blood received in culture bottles   Culture   Final    NO GROWTH 5 DAYS Performed at [facility], [address] [place], [place] [address]    Report Status 09/03/2021 FINAL  Final    Radiology Studies: No results found.    Margean Korell T. Dejour Vos [facility]  If 7PM-7AM, please contact night-coverage [email or web address] 09/05/2021, 12:48 PM

## 2021-09-05 NOTE — TOC Progression Note (Addendum)
Transition of Care [facility]) - Progression Note    Patient Details  Name: Kristy [name] MRN: [id number] Date of Birth: 09/01/1946  Transition of Care [facility]) CM/SW Contact  Lorri Frederick, LCSW Phone Number: 09/05/2021, 10:44 AM  Clinical Narrative:    CSW spoke with [name] at [facility], pt has been in rehab there, not making much progress.  Is currently in copay days for rehab.  Would not be able to bring back as rehab with PT recommendation for LTC.  Can offer LTC self pay bed, semi private room at $300 per day.   CSW observed pt in room, not able to participate. CSW spoke with daughter [name], shared PT recommendations for LTC at this point.  Pt was independent in August when this all began, has had a significant decline, they are still getting used to this.  Pt husband is also having some decline cognitively, but is still at home and doing OK.  Discussed that if rehab is no longer appropriate, long term care options would not be covered by medicare but could be private paid or could talk about putting together plan to bring pt home.  [name] said that private paying would probably not be an option and she would like to look at plan to bring pt home.  Discussed hospice or palliative care as a resource.  [name] would like to  talk about palliative care support, would like the [facility] agency.  Discussed HH aides, also through private pay.  CSW provided [email or web address] for her to look at aide options.    [name] lives in [place]:[address] [place], [place], [address].   1100: referral faxed to [facility] hospice agency in [place]  [phone]. [phone].  Spoke with Kristen  1330: TC Kristen, [place].  She has referral, working on it now, will contact the family.  1600: Attempt to contact daughter [name].  No answer, sent text message for update.     Expected Discharge Plan:  (TBD-Home with palliative care potentially) Barriers to Discharge: Other (must enter comment) (family  making arrangements for long term care at home)  Expected Discharge Plan and Services Expected Discharge Plan:  (TBD-Home with palliative care potentially)   Discharge Planning Services: CM Consult   Living arrangements for the past 2 months: Apartment                                       Social Determinants of Health (SDOH) Interventions    Readmission Risk Interventions No flowsheet data found.

## 2021-09-05 NOTE — Evaluation (Signed)
Physical Therapy Evaluation/ Discharge Patient Details Name: Kristy [name] MRN: [id number] DOB: [birth year] Today's Date: 09/05/2021  History of Present Illness  75 yo admitted 10/19 from SNF with SOB and AMS with intubation in ED. Shortly after cardiac arrest x 2. Extubated 10/22. 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 Pt with improved cognition, calm and pleasant with pt mobility remaining the same at total assist. Pt with bil elbow flexion contractures, bil knees locked in extension and neck with maintained extension/ right rotation. Pt remains to be appropriate for long term care without further need for acute therapy. Will sign off with only minor change from initial evaluation. Recommend air mattress to prevent further skin breakdown considering scalp and sacral wound.        Recommendations for follow up therapy are one component of a multi-disciplinary discharge planning process, led by the attending physician.  Recommendations may be updated based on patient status, additional functional criteria and insurance authorization.  Follow Up Recommendations Long-term institutional care without follow-up therapy    Assistance Recommended at Discharge Frequent or constant Supervision/Assistance  Functional Status Assessment Patient has not had a recent decline in their functional status  Equipment Recommendations  None recommended by PT    Recommendations for Other Services       Precautions / Restrictions Precautions Precautions: Fall Precaution Comments: bil mittens, peg      Mobility  Bed Mobility Overal bed mobility: Needs Assistance Bed Mobility: Rolling Rolling: Total assist         General bed mobility comments: total assist to roll and slide up in bed    Transfers                        Ambulation/Gait                 Stairs            Wheelchair Mobility    Modified Rankin (Stroke Patients Only)       Balance                                             Pertinent Vitals/Pain Pain Assessment: PAINAD Breathing: normal Negative Vocalization: occasional moan/groan, low speech, negative/disapproving quality Facial Expression: smiling or inexpressive Body Language: relaxed Consolability: no need to console PAINAD Score: 1    Home Living Family/patient expects to be discharged to:: Skilled nursing facility                   Additional Comments: Unsure of accuracy given cognitive deficits.    Prior Function Prior Level of Function : Needs assist  Cognitive Assist : ADLs (cognitive);Mobility (cognitive) Mobility (Cognitive): Set up cues ADLs (Cognitive): Step by step cues Physical Assist : Mobility (physical);ADLs (physical) Mobility (physical): Bed mobility;Transfers ADLs (physical): Grooming;Bathing;Dressing;Toileting Mobility Comments: daughter reports pt has been in chair at SNF, but unclear if she assisted or used a lift. called [facility] but sent to voicemail x 2 attempts ADLs Comments: daughter reports patient requires assist for all ADls     Hand Dominance        Extremity/Trunk Assessment   Upper Extremity Assessment Upper Extremity Assessment: RUE deficits/detail;LUE deficits/detail RUE Deficits / Details: pt with  elbow flexion contracture at grossly 10 degrees. Pt with PROM shoulder flexion to 90 degrees LUE Deficits / Details: pt with elbow flexion contracture at grossly 10 degrees. Pt with PROM shoulder flexion to 90 degrees, pt able to tolerate PROM elbow within available ROM    Lower Extremity Assessment Lower Extremity Assessment: RLE deficits/detail;LLE deficits/detail RLE Deficits / Details: unable to flex knee with max assist. Hip abduction/ADD [facility] for PROM, ankle also without significant movement LLE Deficits /  Details: unable to flex knee with max assist. Hip abduction/ADD [facility] for PROM, ankle also without significant movement    Cervical / Trunk Assessment Cervical / Trunk Assessment: Other exceptions;Neck Surgery Cervical / Trunk Exceptions: excessive extension with right rotation with pain with positioning to midline rotation unable to rotate to left and unable to achieve neutral spine  Communication   Communication: No difficulties  Cognition Arousal/Alertness: Awake/alert Behavior During Therapy: WFL for tasks assessed/performed Overall Cognitive Status: Impaired/Different from baseline Area of Impairment: Orientation;Attention;Following commands                   Current Attention Level: Focused   Following Commands: Follows one step commands inconsistently       General Comments: pt with limited assist of extremity movement. Pt stating year as 68 and that she is at "nurse's house"        General Comments      Exercises General Exercises - Upper Extremity Shoulder Flexion: PROM;10 reps;Seated;Both Elbow Flexion: PROM;10 reps;Both;Supine Elbow Extension: PROM;Both;Supine;10 reps General Exercises - Lower Extremity Hip ABduction/ADduction: PROM;Both;Supine;10 reps   Assessment/Plan    PT Assessment All further PT needs can be met in the next venue of care  PT Problem List Decreased mobility;Decreased safety awareness;Decreased strength;Decreased range of motion;Decreased coordination;Decreased cognition;Decreased activity tolerance;Decreased skin integrity;Pain       PT Treatment Interventions      PT Goals (Current goals can be found in the Care Plan section)  Acute Rehab PT Goals PT Goal Formulation: All assessment and education complete, DC therapy    Frequency     Barriers to discharge        Co-evaluation               AM-PAC PT "6 Clicks" Mobility  Outcome Measure Help needed turning from your back to your side while in a flat bed without  using bedrails?: Total Help needed moving from lying on your back to sitting on the side of a flat bed without using bedrails?: Total Help needed moving to and from a bed to a chair (including a wheelchair)?: Total Help needed standing up from a chair using your arms (e.g., wheelchair or bedside chair)?: Total Help needed to walk in hospital room?: Total Help needed climbing 3-5 steps with a railing? : Total 6 Click Score: 6    End of Session   Activity Tolerance: Patient tolerated treatment well Patient left: in bed;with call bell/phone within reach;with bed alarm set Nurse Communication: Mobility status;Need for lift equipment PT Visit Diagnosis: Muscle weakness (generalized) (M62.81);Other abnormalities of gait and mobility (R26.89)    Time: 2446-2863 PT Time Calculation (min) (ACUTE ONLY): 17 min   Charges:   PT Evaluation $PT Eval Moderate Complexity: 1 Mod          Britne Borelli P, PT Acute Rehabilitation Services Pager: [phone] Office: [phone]   Sandy Salaam Alizaya Oshea 09/05/2021, 9:44 AM

## 2021-09-06 LAB — RENAL FUNCTION PANEL
Albumin: 2.2 g/dL — ABNORMAL LOW (ref 3.5–5.0)
Anion gap: 6 (ref 5–15)
BUN: 33 mg/dL — ABNORMAL HIGH (ref 8–23)
CO2: 27 mmol/L (ref 22–32)
Calcium: 8.6 mg/dL — ABNORMAL LOW (ref 8.9–10.3)
Chloride: 113 mmol/L — ABNORMAL HIGH (ref 98–111)
Creatinine, Ser: 0.97 mg/dL (ref 0.44–1.00)
GFR, Estimated: >60 mL/min (ref >60)
Glucose, Bld: 99 mg/dL (ref 70–99)
Phosphorus: 4.2 mg/dL (ref 2.5–4.6)
Potassium: 4.1 mmol/L (ref 3.5–5.1)
Sodium: 146 mmol/L — ABNORMAL HIGH (ref 135–145)

## 2021-09-06 LAB — GLUCOSE, CAPILLARY
Glucose-Capillary: 100 mg/dL — ABNORMAL HIGH (ref 70–99)
Glucose-Capillary: 101 mg/dL — ABNORMAL HIGH (ref 70–99)
Glucose-Capillary: 68 mg/dL — ABNORMAL LOW (ref 70–99)
Glucose-Capillary: 81 mg/dL (ref 70–99)
Glucose-Capillary: 85 mg/dL (ref 70–99)
Glucose-Capillary: 87 mg/dL (ref 70–99)
Glucose-Capillary: 97 mg/dL (ref 70–99)

## 2021-09-06 LAB — CBC
HCT: 27.3 % — ABNORMAL LOW (ref 36.0–46.0)
Hemoglobin: 8.1 g/dL — ABNORMAL LOW (ref 12.0–15.0)
MCH: 31.5 pg (ref 26.0–34.0)
MCHC: 29.7 g/dL — ABNORMAL LOW (ref 30.0–36.0)
MCV: 106.2 fL — ABNORMAL HIGH (ref 80.0–100.0)
Platelets: 138 10*3/uL — ABNORMAL LOW (ref 150–400)
RBC: 2.57 MIL/uL — ABNORMAL LOW (ref 3.87–5.11)
RDW: 20.7 % — ABNORMAL HIGH (ref 11.5–15.5)
WBC: 8.7 10*3/uL (ref 4.0–10.5)
nRBC: 0 % (ref 0.0–0.2)

## 2021-09-06 LAB — MAGNESIUM: Magnesium: 2.2 mg/dL (ref 1.7–2.4)

## 2021-09-06 MED ORDER — ATORVASTATIN CALCIUM 40 MG PO TABS
40.0000 mg | ORAL_TABLET | Freq: Every day | ORAL | Status: DC
  Filled 2021-09-06: qty 1

## 2021-09-06 MED ORDER — FREE WATER
200.0000 mL | Freq: Four times a day (QID) | Status: DC
  Administered 2021-09-06 – 2021-09-09 (×13): 200 mL

## 2021-09-06 MED ORDER — ATORVASTATIN CALCIUM 40 MG PO TABS
40.0000 mg | ORAL_TABLET | Freq: Every day | ORAL | Status: DC
  Administered 2021-09-06 – 2021-09-16 (×10): 40 mg
  Filled 2021-09-06 (×12): qty 1

## 2021-09-06 NOTE — Progress Notes (Signed)
PROGRESS NOTE  Kristy [name]:[id number] DOB: 03-May-1946   PCP: Darrow Bussing, MD  Patient is from: SNF.  DOA: 08/28/2021 LOS: 9  Chief complaints:  Chief Complaint  Patient presents with   Altered Mental Status     Brief Narrative / Interim history: 75 year old F with PMH of alcohol abuse, admitted on 06/10/2021 for encephalopathy, cervical spine discitis and osteomyelitis for which she underwent I&D with anterior cervical discectomy C5-C6, and treated with IV vancomycin and Rocephin, then cefazolin with rifampin, dysphagia and malnutrition requiring PEG tube on 07/11/2021 and urinary tension requiring Foley catheter.    Readmitted on 07/20/2021 with altered mental status on and found to have acute ischemic pontine infarct.  Patient was not on antiplatelet or anticoagulation due to thrombocytopenia and risk of bleeding.  Imaging also showed concern of discitis and osteomyelitis T12-L1 neurosurgery was reconsulted recommended no further surgical intervention.  ID was reconsulted on 07/24/2021 recommended to continue cefazolin.  Palliative care was consulted on 07/26/2021 and family wanted full scope of treatment. Patient was discharged back to [facility] on 08/06/2021 on IV Ancef through 08/25/2021.    Patient was sent back to ED on 08/28/2021 for shortness of breath and AMS.  She had a cardiac arrest while in ED requiring CPR and epinephrine with cardioversion, and admitted to ICU with respiratory failure requiring intubation and mechanical ventilation in the setting of possible aspiration pneumonia and encephalopathy.    Patient was extubated and transferred to [facility] on 09/03/2021.  Medically stable for discharge but difficult placement due to need for long-term care  Subjective: Seen and examined earlier this morning.  No major events overnight of this morning.  No complaints other than skin rash and itching.  She denies pain, shortness of breath or GI  symptoms.  Objective: Vitals:   09/06/21 0400 09/06/21 0809 09/06/21 1057 09/06/21 1132  BP: (!) 129/58 (!) 123/48 (!) 120/54 (!) 129/48  Pulse:   74   Resp:   16   Temp: 98.9 F (37.2 C) 99.2 F (37.3 C) 98.3 F (36.8 C) 98.3 F (36.8 C)  TempSrc: Oral Oral Axillary Axillary  SpO2: 100%     Weight:      Height:        Intake/Output Summary (Last 24 hours) at 09/06/2021 1425 Last data filed at 09/06/2021 0912 Gross per 24 hour  Intake 200 ml  Output 1950 ml  Net -1750 ml   Filed Weights   09/02/21 0450 09/04/21 0412 09/05/21 0816  Weight: 78 kg 75 kg 77.6 kg    Examination:  GENERAL: No apparent distress.  Nontoxic. HEENT: MMM.  Vision and hearing grossly intact.  NECK: Supple.  No apparent JVD.  RESP: 100% on RA.  No IWOB.  Fair aeration bilaterally. CVS:  RRR. Heart sounds normal.  ABD/GI/GU: BS+. Abd soft, NTND.  On TF via G-tube.  Indwelling Foley catheter. MSK/EXT:  Moves extremities. No apparent deformity. No edema.  SKIN: Eczematous skin rash globally NEURO: Awake and alert. Oriented appropriately.  Seems to have LUE weakness compared to right. PSYCH: Calm. Normal affect.   Procedures:  Intubation and extubation  Microbiology summarized: 08/27/2021 blood culture: Gram-positive cocci and Acinetobacter  Assessment & Plan: Acute respiratory failure with hypoxia possibly due to aspiration pneumonia s/p intubation secondary to cardiac arrest: Resolved but remains high risk for reintubation -Aspiration precaution -SLP eval   Gram-positive cocci/Acinetobacter bacteremia and septic shock: Sepsis physiology resolved.  Repeat cultures negative.  CT abdomen and pelvis  did not show source of infection. -ID rec-IV Unasyn in-house, and Bactrim on discharge to complete 14 days total from 10/19.   ATN due to sepsis: Resolved Recent Labs    08/28/21 0835 08/29/21 0409 08/30/21 0436 08/31/21 0327 09/01/21 0250 09/02/21 1416 09/03/21 0417 09/04/21 0410  09/05/21 0224 09/06/21 0200  BUN 46* 46* 36* 34* 26* 26* 33*  CREATININE 1.11* 1.26* 1.25* 1.07* 0.88 0.91 0.77 0.84 0.96 0.97  -Monitor intermittently  Mild hyponatremia: Likely due to insensible loss. -Free water 200 cc every 6 hours -Recheck sodium in the morning  Acute on chronic anemia of chronic: H&H stable after 1 unit on 10/21.   Thrombocytopenia: Improved. Recent Labs    08/30/21 0436 08/30/21 1740 08/31/21 0327 09/01/21 0250 09/01/21 0937 09/02/21 1416 09/03/21 0417 09/04/21 0410 09/05/21 0224 09/06/21 0200  HGB 6.5* 7.7* 7.8* 7.1* 7.1* 7.2* 8.2* 8.7* 8.0* 8.1*  -Monitor intermittently   Acute metabolic encephalopathy: Concern for ABI after cardiac arrest.  Fairly oriented. -Reorientation and delirium precautions.  Chronic indwelling Foley catheter: Exchange on her recent admission. Urine cultures NGTD. -Continue indwelling Foley  Persistent hypoglycemia: Started on Solu-Cortef.  Hypoglycemia resolved. -Weaned to Solu-Cortef to 5 mg twice daily on 10/26.  Plan is to wean off completely when BP stable   Acute CVA recent small pontine stroke: Not on AC or antiplatelet due to thrombocytopenia -Will consider starting aspirin if she passes swallow eval  Profound muscular deconditioning: -Continue physical therapy to continue to work with her.  Hypotension: Seems to have resolved after starting low-dose midodrine -Continue midodrine 5 mg 3 times daily -Continue Solu-Cortef 5 mg twice daily with plan to wean off completely   Goal of care counseling/headaches -Appreciate input by palliative care -Remains full code with full scope of care  Pruritic skin rash-looks like eczematous skin rash but global. -Check B1 and vitamin C level -Thiamine and vitamin C supplementation -Try Benadryl instead of Atarax -Continue Eucerin cream  Hypomagnesemia/hypokalemia: Resolved. -Monitor and replenish as appropriate  Normocytic anemia: H&H stable. Recent Labs     08/30/21 0436 08/30/21 1740 08/31/21 0327 09/01/21 0250 09/01/21 0937 09/02/21 1416 09/03/21 0417 09/04/21 0410 09/05/21 0224 09/06/21 0200  HGB 6.5* 7.7* 7.8* 7.1* 7.1* 7.2* 8.2* 8.7* 8.0* 8.1*  -Continue monitoring  Thrombocytopenia: Improving. Recent Labs  Lab 08/31/21 0327 09/01/21 0250 09/02/21 0556 09/02/21 1416 09/03/21 0417 09/04/21 0410 09/05/21 0224 09/06/21 0200  PLT 41* 41* 40* 44* 59* 95* 116* 138*    Severe malnutrition/dysphagia: Body mass index is 27.61 kg/m. Nutrition Problem: Severe Malnutrition Etiology: acute illness (bacteremia) Signs/Symptoms: mild fat depletion, moderate muscle depletion, percent weight loss (11.7% weight loss in less than 2 months) Percent weight loss: 11.7 % Interventions: Tube feeding, Prostat  Pressure skin injury: POA Pressure Injury 08/28/21 Coccyx Right Stage 2 -  Partial thickness loss of dermis presenting as a shallow open injury with a red, pink wound bed without slough. (Active)  08/28/21 1030  Location: Coccyx  Location Orientation: Right  Staging: Stage 2 -  Partial thickness loss of dermis presenting as a shallow open injury with a red, pink wound bed without slough.  Wound Description (Comments):   Present on Admission: Yes     Pressure Injury 08/28/21 Sacrum Medial Stage 2 -  Partial thickness loss of dermis presenting as a shallow open injury with a red, pink wound bed without slough. (Active)  08/28/21 1030  Location: Sacrum  Location Orientation: Medial  Staging: Stage 2 -  Partial thickness  loss of dermis presenting as a shallow open injury with a red, pink wound bed without slough.  Wound Description (Comments):   Present on Admission:      Pressure Injury 08/28/21 Buttocks Right Stage 2 -  Partial thickness loss of dermis presenting as a shallow open injury with a red, pink wound bed without slough. (Active)  08/28/21 1030  Location: Buttocks  Location Orientation: Right  Staging: Stage 2 -   Partial thickness loss of dermis presenting as a shallow open injury with a red, pink wound bed without slough.  Wound Description (Comments):   Present on Admission:    DVT prophylaxis:  Place and maintain sequential compression device Start: 08/31/21 1102 SCDs Start: 08/28/21 0998  Code Status: Full code Family Communication: Updated patient's daughter over the phone. Level of care: Progressive Status is: Inpatient  Remains inpatient appropriate because: Hemodynamically unstable (hypotension), IV antibiotics Ancef and disposition       Consultants:  Palliative medicine Pulmonology Infectious disease   Sch Meds:  Scheduled Meds:  vitamin C  500 mg Per Tube Daily   chlorhexidine  15 mL Mouth Rinse BID   Chlorhexidine Gluconate Cloth  6 each Topical Daily   feeding supplement (PROSource TF)  45 mL Per Tube Daily   free water  200 mL Per Tube Q6H   hydrocerin   Topical BID   hydrocortisone  5 mg Per Tube BID   mouth rinse  15 mL Mouth Rinse q12n4p   midodrine  5 mg Per Tube TID WC   sodium chloride flush  10-40 mL Intracatheter Q12H   thiamine injection  100 mg Intravenous Daily   Continuous Infusions:  ampicillin-sulbactam (UNASYN) IV 3 g (09/06/21 1055)   feeding supplement (JEVITY 1.5 CAL/FIBER) 1,500 mL (09/05/21 1659)   PRN Meds:.acetaminophen, diphenhydrAMINE, docusate, influenza vaccine adjuvanted, melatonin, pneumococcal 23 valent vaccine, polyethylene glycol, sodium chloride flush  Antimicrobials: Anti-infectives (From admission, onward)    Start     Dose/Rate Route Frequency Ordered Stop   09/05/21 1415  fluconazole (DIFLUCAN) tablet 150 mg  Status:  Discontinued        150 mg Per Tube Daily 09/05/21 1320 09/05/21 1320   09/05/21 1415  fluconazole (DIFLUCAN) 40 MG/ML suspension 200 mg        200 mg Per Tube  Once 09/05/21 1320 09/05/21 1524   08/29/21 1200  vancomycin (VANCOREADY) IVPB 1250 mg/250 mL  Status:  Discontinued        1,250 mg 166.7 mL/hr  over 90 Minutes Intravenous Every 24 hours 08/28/21 1113 08/29/21 0726   08/29/21 1200  vancomycin (VANCOCIN) IVPB 1000 mg/200 mL premix        1,000 mg 200 mL/hr over 60 Minutes Intravenous Every 24 hours 08/29/21 0726 09/01/21 1413   08/28/21 2300  Ampicillin-Sulbactam (UNASYN) 3 g in sodium chloride 0.9 % 100 mL IVPB        3 g 200 mL/hr over 30 Minutes Intravenous Every 6 hours 08/28/21 2200 09/12/21 2359   08/28/21 1800  ceFEPIme (MAXIPIME) 2 g in sodium chloride 0.9 % 100 mL IVPB  Status:  Discontinued        2 g 200 mL/hr over 30 Minutes Intravenous Every 12 hours 08/28/21 1113 08/28/21 2200   08/28/21 1200  vancomycin (VANCOREADY) IVPB 1750 mg/350 mL        1,750 mg 175 mL/hr over 120 Minutes Intravenous  Once 08/28/21 1113 08/28/21 1348   08/28/21 0645  vancomycin (VANCOREADY) IVPB 1750 mg/350 mL  Status:  Discontinued        1,750 mg 175 mL/hr over 120 Minutes Intravenous  Once 08/28/21 0630 08/28/21 1139   08/28/21 0615  ceFEPIme (MAXIPIME) 2 g in sodium chloride 0.9 % 100 mL IVPB        2 g 200 mL/hr over 30 Minutes Intravenous  Once 08/28/21 0608 08/28/21 2045        I have personally reviewed the following labs and images: CBC: Recent Labs  Lab 09/02/21 1416 09/03/21 0417 09/04/21 0410 09/05/21 0224 09/06/21 0200  WBC 7.3 9.5 9.0 10.0 8.7  HGB 7.2* 8.2* 8.7* 8.0* 8.1*  HCT 23.9* 26.6* 27.1* 26.4* 27.3*  MCV 104.4* 103.5* 101.5* 103.1* 106.2*  PLT 44* 59* 95* 116* 138*   BMP &GFR Recent Labs  Lab 09/01/21 0250 09/01/21 0937 09/02/21 1416 09/03/21 0417 09/04/21 0410 09/05/21 0224 09/06/21 0200  NA 143   < > 142 142 142 142 146*  K 4.1   < > 3.8 3.3* 3.7 3.4* 4.1  CL 111  --  108 104 104 108 113*  CO2 26  --  GLUCOSE 90  --  131* 121* 115* 125* 99  BUN 26*  --  26* 33*  CREATININE 0.88  --  0.91 0.77 0.84 0.96 0.97  CALCIUM 8.8*  --  8.7* 9.0 9.0 8.7* 8.6*  MG 1.6*  --  2.0 1.7 1.3* 2.2 2.2  PHOS 3.6  --   --  3.2 2.9 4.1  4.2   < > = values in this interval not displayed.   Estimated Creatinine Clearance: 52.7 mL/min (by C-G formula based on SCr of 0.97 mg/dL). Liver & Pancreas: Recent Labs  Lab 08/31/21 0327 09/05/21 0224 09/06/21 0200  AST 49*  --   --   ALT 11  --   --   ALKPHOS 88  --   --   BILITOT 0.8  --   --   PROT 5.7*  --   --   ALBUMIN 2.1* 2.2* 2.2*   No results for input(s): LIPASE, AMYLASE in the last 168 hours. No results for input(s): AMMONIA in the last 168 hours. Diabetic: No results for input(s): HGBA1C in the last 72 hours. Recent Labs  Lab 09/05/21 2043 09/06/21 0011 09/06/21 0417 09/06/21 0733 09/06/21 1131  GLUCAP 87 97 101* 100* 85   Cardiac Enzymes: No results for input(s): CKTOTAL, CKMB, CKMBINDEX, TROPONINI in the last 168 hours. No results for input(s): PROBNP in the last 8760 hours. Coagulation Profile: Recent Labs  Lab 09/02/21 0556  INR 1.2   Thyroid Function Tests: No results for input(s): TSH, T4TOTAL, FREET4, T3FREE, THYROIDAB in the last 72 hours. Lipid Profile: No results for input(s): CHOL, HDL, LDLCALC, TRIG, CHOLHDL, LDLDIRECT in the last 72 hours. Anemia Panel: No results for input(s): VITAMINB12, FOLATE, FERRITIN, TIBC, IRON, RETICCTPCT in the last 72 hours.  Urine analysis:    Component Value Date/Time   COLORURINE YELLOW 08/28/2021 1205   APPEARANCEUR CLEAR 08/28/2021 1205   LABSPEC 1.020 08/28/2021 1205   PHURINE 5.5 08/28/2021 1205   GLUCOSEU NEGATIVE 08/28/2021 1205   HGBUR SMALL (A) 08/28/2021 1205   BILIRUBINUR NEGATIVE 08/28/2021 1205   KETONESUR NEGATIVE 08/28/2021 1205   PROTEINUR NEGATIVE 08/28/2021 1205   UROBILINOGEN 0.2 05/14/2008 0020   NITRITE NEGATIVE 08/28/2021 1205   LEUKOCYTESUR SMALL (A) 08/28/2021 1205   Sepsis Labs: Invalid input(s): PROCALCITONIN, LACTICIDVEN  Microbiology: Recent Results (from the past 240  hour(s))  Resp Panel by RT-PCR (Flu A&B, Covid) Nasopharyngeal Swab     Status: None   Collection  Time: 08/28/21  6:08 AM   Specimen: Nasopharyngeal Swab; Nasopharyngeal(NP) swabs in vial transport medium  Result Value Ref Range Status   SARS Coronavirus 2 by RT PCR NEGATIVE NEGATIVE Final    Comment: (NOTE) SARS-CoV-2 target nucleic acids are NOT DETECTED.  The SARS-CoV-2 RNA is generally detectable in upper respiratory specimens during the acute phase of infection. The lowest concentration of SARS-CoV-2 viral copies this assay can detect is 138 copies/mL. A negative result does not preclude SARS-Cov-2 infection and should not be used as the sole basis for treatment or other patient management decisions. A negative result may occur with  improper specimen collection/handling, submission of specimen other than nasopharyngeal swab, presence of viral mutation(s) within the areas targeted by this assay, and inadequate number of viral copies(<138 copies/mL). A negative result must be combined with clinical observations, patient history, and epidemiological information. The expected result is Negative.  Fact Sheet for Patients:  BloggerCourse.com  Fact Sheet for Healthcare Providers:  SeriousBroker.it  This test is no t yet approved or cleared by the [place] FDA and  has been authorized for detection and/or diagnosis of SARS-CoV-2 by FDA under an Emergency Use Authorization (EUA). This EUA will remain  in effect (meaning this test can be used) for the duration of the COVID-19 declaration under Section 564(b)(1) of the Act, 21 U.S.C.section 360bbb-3(b)(1), unless the authorization is terminated  or revoked sooner.       Influenza A by PCR NEGATIVE NEGATIVE Final   Influenza B by PCR NEGATIVE NEGATIVE Final    Comment: (NOTE) The Xpert Xpress SARS-CoV-2/FLU/RSV plus assay is intended as an aid in the diagnosis of influenza from Nasopharyngeal swab specimens and should not be used as a sole basis for treatment. Nasal washings  and aspirates are unacceptable for Xpert Xpress SARS-CoV-2/FLU/RSV testing.  Fact Sheet for Patients: BloggerCourse.com  Fact Sheet for Healthcare Providers: SeriousBroker.it  This test is not yet approved or cleared by the [place] FDA and has been authorized for detection and/or diagnosis of SARS-CoV-2 by FDA under an Emergency Use Authorization (EUA). This EUA will remain in effect (meaning this test can be used) for the duration of the COVID-19 declaration under Section 564(b)(1) of the Act, 21 U.S.C. section 360bbb-3(b)(1), unless the authorization is terminated or revoked.  Performed at [facility], [address] [place], [place] [address]   Blood Culture (routine x 2)     Status: Abnormal   Collection Time: 08/28/21  6:13 AM   Specimen: BLOOD  Result Value Ref Range Status   Specimen Description BLOOD RIGHT ANTECUBITAL  Final   Special Requests   Final    BOTTLES DRAWN AEROBIC AND ANAEROBIC Blood Culture adequate volume   Culture  Setup Time   Final    GRAM NEGATIVE RODS AEROBIC BOTTLE ONLY GRAM POSITIVE COCCI CRITICAL RESULT CALLED TO, READ BACK BY AND VERIFIED WITH: PHARMD MICHAEL BITONTI 08/28/21@21 :45 BY TW IN BOTH AEROBIC AND ANAEROBIC BOTTLES    Culture (A)  Final    ACINETOBACTER CALCOACETICUS/BAUMANNII COMPLEX STAPHYLOCOCCUS HAEMOLYTICUS THE SIGNIFICANCE OF ISOLATING THIS ORGANISM FROM A SINGLE SET OF BLOOD CULTURES WHEN MULTIPLE SETS ARE DRAWN IS UNCERTAIN. PLEASE NOTIFY THE MICROBIOLOGY DEPARTMENT WITHIN ONE WEEK IF SPECIATION AND SENSITIVITIES ARE REQUIRED. Performed at [facility], [address], [place], [place] [address]    Report Status 08/31/2021 FINAL  Final  Organism ID, Bacteria ACINETOBACTER CALCOACETICUS/BAUMANNII COMPLEX  Final      Susceptibility   Acinetobacter calcoaceticus/baumannii complex - MIC*    CEFTAZIDIME >=64 RESISTANT Resistant     CIPROFLOXACIN >=4  RESISTANT Resistant     GENTAMICIN 4 SENSITIVE Sensitive     IMIPENEM 1 SENSITIVE Sensitive     PIP/TAZO >=128 RESISTANT Resistant     TRIMETH/SULFA <=20 SENSITIVE Sensitive     AMPICILLIN/SULBACTAM 4 SENSITIVE Sensitive     * ACINETOBACTER CALCOACETICUS/BAUMANNII COMPLEX  Blood Culture ID Panel (Reflexed)     Status: Abnormal   Collection Time: 08/28/21  6:13 AM  Result Value Ref Range Status   Enterococcus faecalis NOT DETECTED NOT DETECTED Final   Enterococcus Faecium NOT DETECTED NOT DETECTED Final   Listeria monocytogenes NOT DETECTED NOT DETECTED Final   Staphylococcus species DETECTED (A) NOT DETECTED Final    Comment: RBV PHARMD MICHAEL BITONTI 08/28/21@21 :45 BY TW    Staphylococcus aureus (BCID) NOT DETECTED NOT DETECTED Final   Staphylococcus epidermidis NOT DETECTED NOT DETECTED Final   Staphylococcus lugdunensis NOT DETECTED NOT DETECTED Final   Streptococcus species NOT DETECTED NOT DETECTED Final   Streptococcus agalactiae NOT DETECTED NOT DETECTED Final   Streptococcus pneumoniae NOT DETECTED NOT DETECTED Final   Streptococcus pyogenes NOT DETECTED NOT DETECTED Final   A.calcoaceticus-baumannii DETECTED (A) NOT DETECTED Final    Comment: CRITICAL RESULT CALLED TO, READ BACK BY AND VERIFIED WITH: PHARMD MICHAEL BITONTI 08/28/21@21 :45 BY TW    Bacteroides fragilis NOT DETECTED NOT DETECTED Final   Enterobacterales NOT DETECTED NOT DETECTED Final   Enterobacter cloacae complex NOT DETECTED NOT DETECTED Final   Escherichia coli NOT DETECTED NOT DETECTED Final   Klebsiella aerogenes NOT DETECTED NOT DETECTED Final   Klebsiella oxytoca NOT DETECTED NOT DETECTED Final   Klebsiella pneumoniae NOT DETECTED NOT DETECTED Final   Proteus species NOT DETECTED NOT DETECTED Final   Salmonella species NOT DETECTED NOT DETECTED Final   Serratia marcescens NOT DETECTED NOT DETECTED Final   Haemophilus influenzae NOT DETECTED NOT DETECTED Final   Neisseria meningitidis NOT DETECTED  NOT DETECTED Final   Pseudomonas aeruginosa NOT DETECTED NOT DETECTED Final   Stenotrophomonas maltophilia NOT DETECTED NOT DETECTED Final   Candida albicans NOT DETECTED NOT DETECTED Final   Candida auris NOT DETECTED NOT DETECTED Final   Candida glabrata NOT DETECTED NOT DETECTED Final   Candida krusei NOT DETECTED NOT DETECTED Final   Candida parapsilosis NOT DETECTED NOT DETECTED Final   Candida tropicalis NOT DETECTED NOT DETECTED Final   Cryptococcus neoformans/gattii NOT DETECTED NOT DETECTED Final   CTX-M ESBL NOT DETECTED NOT DETECTED Final   Carbapenem resistance IMP NOT DETECTED NOT DETECTED Final   Carbapenem resistance KPC NOT DETECTED NOT DETECTED Final   Carbapenem resistance NDM NOT DETECTED NOT DETECTED Final   Carbapenem resistance VIM NOT DETECTED NOT DETECTED Final    Comment: Performed at [facility], [address] [place], [place] [address]  Blood Culture (routine x 2)     Status: None   Collection Time: 08/28/21  6:55 AM   Specimen: BLOOD LEFT WRIST  Result Value Ref Range Status   Specimen Description BLOOD LEFT WRIST  Final   Special Requests   Final    BOTTLES DRAWN AEROBIC AND ANAEROBIC Blood Culture results may not be optimal due to an inadequate volume of blood received in culture bottles   Culture   Final    NO GROWTH 5 DAYS Performed at [facility]  Lab, [address] [place], [place] [address]    Report Status 09/02/2021 FINAL  Final  Urine Culture     Status: Abnormal   Collection Time: 08/28/21 12:05 PM   Specimen: Urine, Clean Catch  Result Value Ref Range Status   Specimen Description URINE, CLEAN CATCH  Final   Special Requests   Final    NONE Performed at [facility], [address], [place], [place] [address]    Culture MULTIPLE SPECIES PRESENT, SUGGEST RECOLLECTION (A)  Final   Report Status 08/29/2021 FINAL  Final  MRSA Next Gen by PCR, Nasal     Status: Abnormal   Collection Time: 08/29/21  4:21 AM   Specimen:  Nasal Mucosa; Nasal Swab  Result Value Ref Range Status   MRSA by PCR Next Gen DETECTED (A) NOT DETECTED Final    Comment: RESULT CALLED TO, READ BACK BY AND VERIFIED WITH: RN ALEXA LEWIS 08/29/21@5 :52 BY TW (NOTE) The GeneXpert MRSA Assay (FDA approved for NASAL specimens only), is one component of a comprehensive MRSA colonization surveillance program. It is not intended to diagnose MRSA infection nor to guide or monitor treatment for MRSA infections. Test performance is not FDA approved in patients less than 19 years old. Performed at [facility], [address] [place], [place] [address]   Culture, blood (Routine X 2) w Reflex to ID Panel     Status: None   Collection Time: 08/29/21 12:20 PM   Specimen: BLOOD LEFT HAND  Result Value Ref Range Status   Specimen Description BLOOD LEFT HAND  Final   Special Requests   Final    BOTTLES DRAWN AEROBIC AND ANAEROBIC Blood Culture results may not be optimal due to an inadequate volume of blood received in culture bottles   Culture   Final    NO GROWTH 5 DAYS Performed at [facility], [address], [place], [place] [address]    Report Status 09/03/2021 FINAL  Final    Radiology Studies: No results found.    Harlea Goetzinger T. Dov Dill [facility]  If 7PM-7AM, please contact night-coverage [email or web address] 09/06/2021, 2:25 PM

## 2021-09-06 NOTE — Evaluation (Signed)
Clinical/Bedside Swallow Evaluation Patient Details  Name: Kristy [name] MRN: [id number] Date of Birth: 1945/12/15  Today's Date: 09/06/2021 Time: SLP Start Time (ACUTE ONLY): 1535 SLP Stop Time (ACUTE ONLY): 1550 SLP Time Calculation (min) (ACUTE ONLY): 15 min  Past Medical History: No past medical history on file. Past Surgical History:  Past Surgical History:  Procedure Laterality Date   ANTERIOR CERVICAL DECOMP/DISCECTOMY FUSION N/A 06/16/2021   Procedure: ANTERIOR CERVICAL DISCECTOMY FUSION Cervical five -Cervical six, Evacuation of Epidural Abscess;  Surgeon: Jadene Pierini, MD;  Location: [facility] OR;  Service: Neurosurgery;  Laterality: N/A;   IR GASTROSTOMY TUBE MOD SED  07/02/2021   HPI:  Patient is a 75 y.o. female with PMH: ETOH abuse, prior admission on 06/10/2021 for encephalopathy, cervical spine discitis and osteomyelitis for which she underwent I&D with ACDF C5-C6, dysphagia and malnutrition requiring PEG tube on 07/11/2021 and urinary tension requiring Foley catheter. 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 She was discharged 9/27 to SNF.   Patient presented to ED 10/19 for SOB and AMS. She had a cardiac arrest while in ED requiring CPR. She was admitted to ICU with respiratory failure requiring intubation and mechanical ventilation in setting of possible aspiration PNA and encephalopathy. She was extubated on 10/25. MBS during previous hospital stay on 8/15 revealed moderately impaired dysphagia from both mechanical and cognitive components. Patient was upgraded to dys 3 solids and thin liquids however PEG was placed due to poor intake.    Assessment / Plan / Recommendation  Clinical Impression  Patient presents with clinical s/s of dysphagia as per this bedside swallow evaluation. Although her voice was clear and strong prior to and after PO's, she exhibited several instances of delayed cough following sips of thin liquids (water) via straw sip.  Swallow initiation appeared to be timely and no immediate overt s/s aspiration or penetration observed. As patient already has active PEG, SLP recommending to continue NPO status and will follow patient for ability to trial PO's and determine need for objecitve swallow study secondary to patient's history of dysphagia. SLP Visit Diagnosis: Dysphagia, unspecified (R13.10)    Aspiration Risk  Moderate aspiration risk;Mild aspiration risk    Diet Recommendation NPO   Medication Administration: Via alternative means    Other  Recommendations Oral Care Recommendations: Oral care BID;Staff/trained caregiver to provide oral care    Recommendations for follow up therapy are one component of a multi-disciplinary discharge planning process, led by the attending physician.  Recommendations may be updated based on patient status, additional functional criteria and insurance authorization.  Follow up Recommendations 24 hour supervision/assistance;Skilled Nursing facility      Frequency and Duration min 2x/week  1 week       Prognosis Prognosis for Safe Diet Advancement: Fair Barriers to Reach Goals: Time post onset Barriers/Prognosis Comment: patient with PEG since previous admission in August of 2022 due to poor intake and dysphagia      Swallow Study   General Date of Onset: 08/28/21 HPI: Patient is a 75 y.o. female with PMH: ETOH abuse, prior admission on 06/10/2021 for encephalopathy, cervical spine discitis and osteomyelitis for which she underwent I&D with ACDF C5-C6, dysphagia and malnutrition requiring PEG tube on 07/11/2021 and urinary tension requiring Foley catheter. 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 She was discharged 9/27 to SNF.   Patient presented to ED 10/19 for SOB and AMS. She had a cardiac arrest while in  ED requiring CPR. She was admitted to ICU with respiratory failure requiring intubation and mechanical ventilation in setting of possible  aspiration PNA and encephalopathy. She was extubated on 10/25. MBS during previous hospital stay on 8/15 revealed moderately impaired dysphagia from both mechanical and cognitive components. Patient was upgraded to dys 3 solids and thin liquids however PEG was placed due to poor intake. Type of Study: Bedside Swallow Evaluation Previous Swallow Assessment: During previous admission, MBS August of 2022 Diet Prior to this Study: NPO Temperature Spikes Noted: No Respiratory Status: Room air History of Recent Intubation: Yes Length of Intubations (days): 7 days Date extubated: 09/03/21 Behavior/Cognition: Alert;Cooperative;Pleasant mood Oral Cavity Assessment: Within Functional Limits Oral Care Completed by SLP: Yes Oral Cavity - Dentition: Edentulous;Missing dentition;Other (Comment) (edentulous top, reports no dentures) Self-Feeding Abilities: Total assist Patient Positioning: Upright in bed Baseline Vocal Quality: Normal Volitional Cough: Cognitively unable to elicit Volitional Swallow: Unable to elicit    Oral/Motor/Sensory Function Overall Oral Motor/Sensory Function: Within functional limits   Ice Chips     Thin Liquid Thin Liquid: Impaired Presentation: Straw Pharyngeal  Phase Impairments: Cough - Delayed    Nectar Thick     Honey Thick     Puree Puree: Not tested   Solid     Solid: Not tested      Angela Nevin, MA, CCC-SLP Speech Therapy

## 2021-09-06 NOTE — Progress Notes (Addendum)
Daily Progress Note   Patient Name: Kristy [name]       Date: 09/06/2021 DOB: 07/07/1946  Age: 75 y.o. MRN#: [id number] Attending Physician: Almon Hercules, MD Primary Care Physician: Darrow Bussing, MD Admit Date: 08/28/2021  Reason for Consultation/Follow-up: Establishing goals of care  Patient Profile/HPI:  75 y.o. 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 Shortly thereafter she cardiac arrested x 2 and was admitted to ICU. She was extubated on 10/23. She remains confused. Palliative medicine consulted for GOC.     Subjective: Received call from social work with request to call patient's daughter.  Mahathi is awake and alert, denies pain.  I called Kandice Robinsons is frustrated regarding options for care for patient. Unfortunately, patient is not able to participate in PT and has been denied SNF placement for rehab with recommendations made for long term care. However, there are not financial resources to pay for long term care.  [name] expressed frustration with lack of updates from medical team. [name] requested that I go A-Z through patient's past months journeys. I attempted to review her medical record and multiple setbacks and comorbidities with [name]- starting with her fist hospitalization in August, however, [name] then stated that she knew all of these things so I did not continue.  [name] asked about her mother's prognosis. I frankly told [name] that her mother has had  multiple hospitalizations - with constant decline. She has not been able to walk since August. We discussed that often we can keep patient's alive, but we get to a point where we can't improve their function or their quality of life. She is not likely to recover to the point of function that she was in August prior to her severe illnesses.  We again reviewed Hospice services and philosophy as well as the difference between community Palliative services and Hospice services.  [name] had questions about Medicare coverage of equipment in the home if she does not pursue hospice- will defer that information to social work.    Review of Systems  Unable to perform ROS: Mental acuity  Physical Exam Vitals and nursing note reviewed.  Constitutional:      General: She is not in acute distress.    Appearance: She is ill-appearing.  Cardiovascular:     Rate and Rhythm: Normal rate.     Pulses: Normal pulses.  Pulmonary:     Effort: Pulmonary effort is normal.  Skin:    Comments: Diffuse anasarca, red scaly areas on arms, diffuse dry skin  Neurological:     Mental Status: She is disoriented.            Vital Signs: BP (!) 129/48   Pulse 74   Temp 98.3 F (36.8 C) (Axillary)   Resp 16   Ht 5\' 6"  (1.676 m)   Wt 77.6 kg   SpO2 100%   BMI 27.61 kg/m  SpO2: SpO2: 100 % O2 Device: O2 Device: Room Air O2 Flow Rate: O2 Flow Rate (L/min): 4 L/min  Intake/output summary:  Intake/Output Summary (Last 24 hours) at 09/06/2021 1206 Last data filed at 09/06/2021 09/08/2021 Gross per 24 hour  Intake 200 ml  Output 1950 ml  Net -1750 ml    LBM: Last BM Date: 09/02/21 Baseline Weight: Weight: 84.1 kg Most recent weight: Weight: 77.6 kg       Palliative Assessment/Data: PPS: 20%        Palliative Care Assessment & Plan    Assessment/Recommendations/Plan   Continue current plan of care 09/04/21 has not yet made decision regarding goals of care- she is planning on speaking with other  family members regarding decisions and disposition Noted that referral has been made for Hospice by SW PMT will f/u on Monday   Code Status: Full code  Prognosis:  Unable to determine  Discharge Planning: To Be Determined    Thank you for allowing the Palliative Medicine Team to assist in the care of this patient.  Total time:  45 mins Prolonged billing: No     Greater than 50%  of this time was spent counseling and coordinating care related to the above assessment and plan.  Wednesday, AGNP-C Palliative Medicine   Please contact Palliative Medicine Team phone at [phone] for questions and concerns.

## 2021-09-06 NOTE — TOC Progression Note (Signed)
Transition of Care [facility]) - Progression Note    Patient Details  Name: Kristy [name] MRN: [id number] Date of Birth: Mar 18, 1946  Transition of [facility]) CM/SW Contact  Lorri Frederick, LCSW Phone Number: 09/06/2021, 10:38 AM  Clinical Narrative:   CSW spoke with pt daughter [name].  She did not speak with palliative care yesterday.  She has not been able to look into hiring [facility] aide to assist her in caring for pt at home.  Daughter frustrated, said she has not been given update on pt condition or on what she will need in order to take pt home and is only being told that pt will need to DC soon.  CSW validated feelings, reassured that we will get needed DME.  Pt reporting she will be working on these arrangements over the weekend, but is at work all day today.  CSW spoke with Belenda Cruise at [facility].  She will be reaching out to pt today. CSW spoke with Kasie/Palliative.  She will be attempting to contact daughter as well.  CW also messaged Dr Alanda Slim about medical update.      Expected Discharge Plan:  (TBD-Home with palliative care potentially) Barriers to Discharge: Other (must enter comment) (family making arrangements for long term care at home)  Expected Discharge Plan and Services Expected Discharge Plan:  (TBD-Home with palliative care potentially)   Discharge Planning Services: CM Consult   Living arrangements for the past 2 months: Apartment                                       Social Determinants of Health (SDOH) Interventions    Readmission Risk Interventions No flowsheet data found.

## 2021-09-07 LAB — SODIUM: Sodium: 141 mmol/L (ref 135–145)

## 2021-09-07 LAB — GLUCOSE, CAPILLARY
Glucose-Capillary: 162 mg/dL — ABNORMAL HIGH (ref 70–99)
Glucose-Capillary: 49 mg/dL — ABNORMAL LOW (ref 70–99)
Glucose-Capillary: 63 mg/dL — ABNORMAL LOW (ref 70–99)
Glucose-Capillary: 64 mg/dL — ABNORMAL LOW (ref 70–99)
Glucose-Capillary: 66 mg/dL — ABNORMAL LOW (ref 70–99)
Glucose-Capillary: 67 mg/dL — ABNORMAL LOW (ref 70–99)
Glucose-Capillary: 80 mg/dL (ref 70–99)
Glucose-Capillary: 85 mg/dL (ref 70–99)
Glucose-Capillary: 89 mg/dL (ref 70–99)

## 2021-09-07 LAB — CBC
HCT: 26.9 % — ABNORMAL LOW (ref 36.0–46.0)
Hemoglobin: 8.3 g/dL — ABNORMAL LOW (ref 12.0–15.0)
MCH: 32.4 pg (ref 26.0–34.0)
MCHC: 30.9 g/dL (ref 30.0–36.0)
MCV: 105.1 fL — ABNORMAL HIGH (ref 80.0–100.0)
Platelets: 143 10*3/uL — ABNORMAL LOW (ref 150–400)
RBC: 2.56 MIL/uL — ABNORMAL LOW (ref 3.87–5.11)
RDW: 19.3 % — ABNORMAL HIGH (ref 11.5–15.5)
WBC: 9 10*3/uL (ref 4.0–10.5)
nRBC: 0 % (ref 0.0–0.2)

## 2021-09-07 LAB — MAGNESIUM: Magnesium: 1.7 mg/dL (ref 1.7–2.4)

## 2021-09-07 MED ORDER — ASPIRIN 81 MG PO CHEW
81.0000 mg | CHEWABLE_TABLET | Freq: Every day | ORAL | Status: DC
  Administered 2021-09-07 – 2021-09-17 (×11): 81 mg
  Filled 2021-09-07 (×11): qty 1

## 2021-09-07 MED ORDER — DEXTROSE 50 % IV SOLN
INTRAVENOUS | Status: AC
  Filled 2021-09-07: qty 50

## 2021-09-07 NOTE — Progress Notes (Signed)
PROGRESS NOTE  Kristy [name] CVU:[id number] DOB: 18-Apr-1946   PCP: Darrow Bussing, MD  Patient is from: SNF.  DOA: 08/28/2021 LOS: 10  Chief complaints:  Chief Complaint  Patient presents with   Altered Mental Status     Brief Narrative / Interim history: 75 year old F with PMH of alcohol abuse, admitted on 06/10/2021 for encephalopathy, cervical spine discitis and osteomyelitis for which she underwent I&D with anterior cervical discectomy C5-C6, and treated with IV vancomycin and Rocephin, then cefazolin with rifampin, dysphagia and malnutrition requiring PEG tube on 07/11/2021 and urinary tension requiring Foley catheter.    Readmitted on 07/20/2021 with altered mental status on and found to have acute ischemic pontine infarct.  Patient was not on antiplatelet or anticoagulation due to thrombocytopenia and risk of bleeding.  Imaging also showed concern of discitis and osteomyelitis T12-L1 neurosurgery was reconsulted recommended no further surgical intervention.  ID was reconsulted on 07/24/2021 recommended to continue cefazolin.  Palliative care was consulted on 07/26/2021 and family wanted full scope of treatment. Patient was discharged back to [facility] on 08/06/2021 on IV Ancef through 08/25/2021.    Patient was sent back to ED on 08/28/2021 for shortness of breath and AMS.  She had a cardiac arrest while in ED requiring CPR and epinephrine with cardioversion, and admitted to ICU with respiratory failure requiring intubation and mechanical ventilation in the setting of possible aspiration pneumonia and encephalopathy.    Patient was extubated and transferred to [facility] on 09/03/2021.  Medically stable for discharge but difficult placement due to need for long-term care  Subjective: Seen and examined earlier this morning.  She was hypoglycemic to 60s last night and earlier this morning but recovers with dextrose amps.  No complaints.  She denies pain, shortness of breath,  abdominal pain, nausea or vomiting.  Objective: Vitals:   09/07/21 0000 09/07/21 0400 09/07/21 0820 09/07/21 1200  BP:   (!) 149/71 134/85  Pulse: 78 88 85 76  Resp:   20 20  Temp: 97.6 F (36.4 C) 98 F (36.7 C) 98 F (36.7 C)   TempSrc: Axillary Axillary Axillary   SpO2: 100% 100% 100% 100%  Weight:      Height:        Intake/Output Summary (Last 24 hours) at 09/07/2021 1503 Last data filed at 09/07/2021 0825 Gross per 24 hour  Intake 510 ml  Output 1950 ml  Net -1440 ml   Filed Weights   09/02/21 0450 09/04/21 0412 09/05/21 0816  Weight: 78 kg 75 kg 77.6 kg    Examination:  GENERAL: Chronically ill-appearing.  No apparent distress. HEENT: MMM.  Vision and hearing grossly intact.  NECK: Supple.  No apparent JVD.  RESP:  No IWOB.  Fair aeration bilaterally. CVS:  RRR. Heart sounds normal.  ABD/GI/GU: BS+. Abd soft, NTND.  On TF via G-tube.  Indwelling Foley catheter. MSK/EXT:  Moves extremities. No apparent deformity.  Trace edema. SKIN: Stage II sacral decubitus ulcer. NEURO: Awake and alert.  Oriented fairly.  No focal neurodeficit but no globally weak PSYCH: Calm. Normal affect.   Procedures:  Intubation and extubation  Microbiology summarized: 08/27/2021 blood culture: Gram-positive cocci and Acinetobacter  Assessment & Plan: Acute respiratory failure with hypoxia possibly due to aspiration pneumonia s/p intubation secondary to cardiac arrest: Resolved but remains high risk for reintubation -Aspiration precaution -SLP recommended n.p.o.   Gram-positive cocci/Acinetobacter bacteremia and septic shock: Sepsis physiology resolved.  Repeat cultures negative.  CT abdomen and pelvis did  not show source of infection. -ID rec-IV Unasyn in-house, and Bactrim on discharge to complete 14 days total from 10/19.   ATN due to sepsis: Resolved Recent Labs    08/28/21 0835 08/29/21 0409 08/30/21 0436 08/31/21 0327 09/01/21 0250 09/02/21 1416 09/03/21 0417  09/04/21 0410 09/05/21 0224 09/06/21 0200  BUN 46* 46* 36* 34* 26* 26* 33*  CREATININE 1.11* 1.26* 1.25* 1.07* 0.88 0.91 0.77 0.84 0.96 0.97  -Monitor intermittently  Mild hyponatremia: Likely due to insensible loss. -Continue Free water 200 cc every 6 hours -Recheck sodium  Acute on chronic anemia of chronic: H&H stable after 1 unit on 10/21.   Thrombocytopenia: Improved. Recent Labs    08/30/21 1740 08/31/21 0327 09/01/21 0250 09/01/21 0937 09/02/21 1416 09/03/21 0417 09/04/21 0410 09/05/21 0224 09/06/21 0200 09/07/21 0542  HGB 7.7* 7.8* 7.1* 7.1* 7.2* 8.2* 8.7* 8.0* 8.1* 8.3*  -Monitor intermittently   Acute metabolic encephalopathy: Concern for ABI after cardiac arrest.  Fairly oriented. -Reorientation and delirium precautions.  Chronic indwelling Foley catheter: Exchange on her recent admission. Urine cultures NGTD. -Continue indwelling Foley  Recurrent hypoglycemia: Hypoglycemic to 60s despite TF after weaning of p.o. Solu-Cortef.  She is not hypotensive although she is on midodrine.  Insulinoma? -Reconsulted dietitian to reassess tube feed -May consider checking plasma insulin, insulin ab, BHA and/or glucagon but CBG not less than 50   Acute CVA recent small pontine stroke: Not on AC or antiplatelet due to thrombocytopenia.  Thrombocytopenia is resolving now -Start chewable aspirin via G-tube -Continue statin  Profound muscular deconditioning: -Continue physical therapy to continue to work with her.  Hypotension: Seems to have resolved after starting low-dose midodrine -Continue midodrine 5 mg 3 times daily -Continue Solu-Cortef 5 mg twice daily with plan to wean off completely   Goal of care counseling/headaches -Appreciate input by palliative care -Remains full code with full scope of care  Pruritic skin rash-looks like eczematous skin rash but global. -Check B1 and vitamin C level -Thiamine and vitamin C supplementation -Try Benadryl instead  of Atarax -Continue Eucerin cream  Hypomagnesemia/hypokalemia: Resolved. -Monitor and replenish as appropriate  Normocytic anemia: H&H stable.  Anemia panel basically normal. Recent Labs    08/30/21 1740 08/31/21 0327 09/01/21 0250 09/01/21 0937 09/02/21 1416 09/03/21 0417 09/04/21 0410 09/05/21 0224 09/06/21 0200 09/07/21 0542  HGB 7.7* 7.8* 7.1* 7.1* 7.2* 8.2* 8.7* 8.0* 8.1* 8.3*  -Continue monitoring  Thrombocytopenia: Resolving. Recent Labs  Lab 09/01/21 0250 09/02/21 0556 09/02/21 1416 09/03/21 0417 09/04/21 0410 09/05/21 0224 09/06/21 0200 09/07/21 0542  PLT 41* 40* 44* 59* 95* 116* 138* 143*  -Continue monitoring  Severe malnutrition/dysphagia: remains  Body mass index is 27.61 kg/m. Nutrition Problem: Severe Malnutrition Etiology: acute illness (bacteremia) Signs/Symptoms: mild fat depletion, moderate muscle depletion, percent weight loss (11.7% weight loss in less than 2 months) Percent weight loss: 11.7 % Interventions: Tube feeding, Prostat  Pressure skin injury: POA Pressure Injury 08/28/21 Coccyx Right Stage 2 -  Partial thickness loss of dermis presenting as a shallow open injury with a red, pink wound bed without slough. (Active)  08/28/21 1030  Location: Coccyx  Location Orientation: Right  Staging: Stage 2 -  Partial thickness loss of dermis presenting as a shallow open injury with a red, pink wound bed without slough.  Wound Description (Comments):   Present on Admission: Yes     Pressure Injury 08/28/21 Sacrum Medial Stage 2 -  Partial thickness loss of dermis presenting as a shallow open injury with  a red, pink wound bed without slough. (Active)  08/28/21 1030  Location: Sacrum  Location Orientation: Medial  Staging: Stage 2 -  Partial thickness loss of dermis presenting as a shallow open injury with a red, pink wound bed without slough.  Wound Description (Comments):   Present on Admission:      Pressure Injury 08/28/21 Buttocks Right  Stage 2 -  Partial thickness loss of dermis presenting as a shallow open injury with a red, pink wound bed without slough. (Active)  08/28/21 1030  Location: Buttocks  Location Orientation: Right  Staging: Stage 2 -  Partial thickness loss of dermis presenting as a shallow open injury with a red, pink wound bed without slough.  Wound Description (Comments):   Present on Admission:    DVT prophylaxis:  Place and maintain sequential compression device Start: 08/31/21 1102 SCDs Start: 08/28/21 0277  Code Status: Full code Family Communication: Updated patient's daughter over the phone on 10/28. Level of care: Progressive Status is: Inpatient  Remains inpatient appropriate because: Lack of safe disposition       Consultants:  Palliative medicine Pulmonology Infectious disease   Sch Meds:  Scheduled Meds:  vitamin C  500 mg Per Tube Daily   atorvastatin  40 mg Per Tube q1800   chlorhexidine  15 mL Mouth Rinse BID   Chlorhexidine Gluconate Cloth  6 each Topical Daily   feeding supplement (PROSource TF)  45 mL Per Tube Daily   free water  200 mL Per Tube Q6H   hydrocerin   Topical BID   mouth rinse  15 mL Mouth Rinse q12n4p   midodrine  5 mg Per Tube TID WC   sodium chloride flush  10-40 mL Intracatheter Q12H   thiamine injection  100 mg Intravenous Daily   Continuous Infusions:  ampicillin-sulbactam (UNASYN) IV 3 g (09/07/21 1233)   feeding supplement (JEVITY 1.5 CAL/FIBER) 1,500 mL (09/05/21 1659)   PRN Meds:.acetaminophen, diphenhydrAMINE, docusate, influenza vaccine adjuvanted, melatonin, pneumococcal 23 valent vaccine, polyethylene glycol, sodium chloride flush  Antimicrobials: Anti-infectives (From admission, onward)    Start     Dose/Rate Route Frequency Ordered Stop   09/05/21 1415  fluconazole (DIFLUCAN) tablet 150 mg  Status:  Discontinued        150 mg Per Tube Daily 09/05/21 1320 09/05/21 1320   09/05/21 1415  fluconazole (DIFLUCAN) 40 MG/ML suspension 200  mg        200 mg Per Tube  Once 09/05/21 1320 09/05/21 1524   08/29/21 1200  vancomycin (VANCOREADY) IVPB 1250 mg/250 mL  Status:  Discontinued        1,250 mg 166.7 mL/hr over 90 Minutes Intravenous Every 24 hours 08/28/21 1113 08/29/21 0726   08/29/21 1200  vancomycin (VANCOCIN) IVPB 1000 mg/200 mL premix        1,000 mg 200 mL/hr over 60 Minutes Intravenous Every 24 hours 08/29/21 0726 09/01/21 1413   08/28/21 2300  Ampicillin-Sulbactam (UNASYN) 3 g in sodium chloride 0.9 % 100 mL IVPB        3 g 200 mL/hr over 30 Minutes Intravenous Every 6 hours 08/28/21 2200 09/12/21 2359   08/28/21 1800  ceFEPIme (MAXIPIME) 2 g in sodium chloride 0.9 % 100 mL IVPB  Status:  Discontinued        2 g 200 mL/hr over 30 Minutes Intravenous Every 12 hours 08/28/21 1113 08/28/21 2200   08/28/21 1200  vancomycin (VANCOREADY) IVPB 1750 mg/350 mL        1,750 mg  175 mL/hr over 120 Minutes Intravenous  Once 08/28/21 1113 08/28/21 1348   08/28/21 0645  vancomycin (VANCOREADY) IVPB 1750 mg/350 mL  Status:  Discontinued        1,750 mg 175 mL/hr over 120 Minutes Intravenous  Once 08/28/21 0630 08/28/21 1139   08/28/21 0615  ceFEPIme (MAXIPIME) 2 g in sodium chloride 0.9 % 100 mL IVPB        2 g 200 mL/hr over 30 Minutes Intravenous  Once 08/28/21 0608 08/28/21 2045        I have personally reviewed the following labs and images: CBC: Recent Labs  Lab 09/03/21 0417 09/04/21 0410 09/05/21 0224 09/06/21 0200 09/07/21 0542  WBC 9.5 9.0 10.0 8.7 9.0  HGB 8.2* 8.7* 8.0* 8.1* 8.3*  HCT 26.6* 27.1* 26.4* 27.3* 26.9*  MCV 103.5* 101.5* 103.1* 106.2* 105.1*  PLT 59* 95* 116* 138* 143*   BMP &GFR Recent Labs  Lab 09/01/21 0250 09/01/21 0937 09/02/21 1416 09/03/21 0417 09/04/21 0410 09/05/21 0224 09/06/21 0200 09/07/21 0542  NA 143   < > 142 142 142 142 146*  --   K 4.1   < > 3.8 3.3* 3.7 3.4* 4.1  --   CL 111  --  108 104 104 108 113*  --   CO2 26  --  26 29 29 29 27   --   GLUCOSE 90  --  131*  121* 115* 125* 99  --   BUN 26*  --  22 19 20  26* 33*  --   CREATININE 0.88  --  0.91 0.77 0.84 0.96 0.97  --   CALCIUM 8.8*  --  8.7* 9.0 9.0 8.7* 8.6*  --   MG 1.6*  --  2.0 1.7 1.3* 2.2 2.2 1.7  PHOS 3.6  --   --  3.2 2.9 4.1 4.2  --    < > = values in this interval not displayed.   Estimated Creatinine Clearance: 52.7 mL/min (by C-G formula based on SCr of 0.97 mg/dL). Liver & Pancreas: Recent Labs  Lab 09/05/21 0224 09/06/21 0200  ALBUMIN 2.2* 2.2*   No results for input(s): LIPASE, AMYLASE in the last 168 hours. No results for input(s): AMMONIA in the last 168 hours. Diabetic: No results for input(s): HGBA1C in the last 72 hours. Recent Labs  Lab 09/07/21 0409 09/07/21 0908 09/07/21 1142 09/07/21 1302 09/07/21 1333  GLUCAP 64* 85 66* 63* 162*   Cardiac Enzymes: No results for input(s): CKTOTAL, CKMB, CKMBINDEX, TROPONINI in the last 168 hours. No results for input(s): PROBNP in the last 8760 hours. Coagulation Profile: Recent Labs  Lab 09/02/21 0556  INR 1.2   Thyroid Function Tests: No results for input(s): TSH, T4TOTAL, FREET4, T3FREE, THYROIDAB in the last 72 hours. Lipid Profile: No results for input(s): CHOL, HDL, LDLCALC, TRIG, CHOLHDL, LDLDIRECT in the last 72 hours. Anemia Panel: No results for input(s): VITAMINB12, FOLATE, FERRITIN, TIBC, IRON, RETICCTPCT in the last 72 hours.  Urine analysis:    Component Value Date/Time   COLORURINE YELLOW 08/28/2021 1205   APPEARANCEUR CLEAR 08/28/2021 1205   LABSPEC 1.020 08/28/2021 1205   PHURINE 5.5 08/28/2021 1205   GLUCOSEU NEGATIVE 08/28/2021 1205   HGBUR SMALL (A) 08/28/2021 1205   BILIRUBINUR NEGATIVE 08/28/2021 1205   KETONESUR NEGATIVE 08/28/2021 1205   PROTEINUR NEGATIVE 08/28/2021 1205   UROBILINOGEN 0.2 05/14/2008 0020   NITRITE NEGATIVE 08/28/2021 1205   LEUKOCYTESUR SMALL (A) 08/28/2021 1205   Sepsis Labs: Invalid input(s): PROCALCITONIN, LACTICIDVEN  Microbiology:  Recent Results (from  the past 240 hour(s))  MRSA Next Gen by PCR, Nasal     Status: Abnormal   Collection Time: 08/29/21  4:21 AM   Specimen: Nasal Mucosa; Nasal Swab  Result Value Ref Range Status   MRSA by PCR Next Gen DETECTED (A) NOT DETECTED Final    Comment: RESULT CALLED TO, READ BACK BY AND VERIFIED WITH: RN ALEXA LEWIS 08/29/21@5 :52 BY TW (NOTE) The GeneXpert MRSA Assay (FDA approved for NASAL specimens only), is one component of a comprehensive MRSA colonization surveillance program. It is not intended to diagnose MRSA infection nor to guide or monitor treatment for MRSA infections. Test performance is not FDA approved in patients less than 64 years old. Performed at [facility], [address] [place], [place] [address]   Culture, blood (Routine X 2) w Reflex to ID Panel     Status: None   Collection Time: 08/29/21 12:20 PM   Specimen: BLOOD LEFT HAND  Result Value Ref Range Status   Specimen Description BLOOD LEFT HAND  Final   Special Requests   Final    BOTTLES DRAWN AEROBIC AND ANAEROBIC Blood Culture results may not be optimal due to an inadequate volume of blood received in culture bottles   Culture   Final    NO GROWTH 5 DAYS Performed at [facility], [address], [place], [place] [address]    Report Status 09/03/2021 FINAL  Final    Radiology Studies: No results found.    Domnic Vantol T. Gevork Ayyad [facility]  If 7PM-7AM, please contact night-coverage [email or web address] 09/07/2021, 3:03 PM

## 2021-09-07 NOTE — Progress Notes (Signed)
Hypoglycemic Event  CBG: 63  Treatment: D50 25 mL (12.5 gm)  Symptoms: None  Follow-up CBG: Time:1333 CBG Result:162  Possible Reasons for Event: Unknown  Comments/MD notified: Alanda Slim, MD notified.    Denise Bramblett Mijangos-C[place]ona

## 2021-09-07 NOTE — Progress Notes (Signed)
Patients brother and sister-in-law at bedside requesting information and updates on patient. This RN notified family that we can not give specific information without permission from next of kin. Brother, Felicity Pellegrini, is requesting to be called if the daughter gives permission to be involved in care. His cell phone number is: [phone]

## 2021-09-08 LAB — FERRITIN: Ferritin: 188 ng/mL (ref 11–307)

## 2021-09-08 LAB — RENAL FUNCTION PANEL
Albumin: 2.1 g/dL — ABNORMAL LOW (ref 3.5–5.0)
Anion gap: 7 (ref 5–15)
BUN: 27 mg/dL — ABNORMAL HIGH (ref 8–23)
CO2: 28 mmol/L (ref 22–32)
Calcium: 8.7 mg/dL — ABNORMAL LOW (ref 8.9–10.3)
Chloride: 106 mmol/L (ref 98–111)
Creatinine, Ser: 0.69 mg/dL (ref 0.44–1.00)
GFR, Estimated: >60 mL/min (ref >60)
Glucose, Bld: 97 mg/dL (ref 70–99)
Phosphorus: 3.5 mg/dL (ref 2.5–4.6)
Potassium: 3.6 mmol/L (ref 3.5–5.1)
Sodium: 141 mmol/L (ref 135–145)

## 2021-09-08 LAB — IRON AND TIBC
Iron: 47 ug/dL (ref 28–170)
Saturation Ratios: 19 % (ref 10.4–31.8)
TIBC: 242 ug/dL — ABNORMAL LOW (ref 250–450)
UIBC: 195 ug/dL

## 2021-09-08 LAB — GLUCOSE, CAPILLARY
Glucose-Capillary: 66 mg/dL — ABNORMAL LOW (ref 70–99)
Glucose-Capillary: 79 mg/dL (ref 70–99)
Glucose-Capillary: 81 mg/dL (ref 70–99)
Glucose-Capillary: 87 mg/dL (ref 70–99)
Glucose-Capillary: 87 mg/dL (ref 70–99)
Glucose-Capillary: 91 mg/dL (ref 70–99)

## 2021-09-08 LAB — RETICULOCYTES
Immature Retic Fract: 18.4 % — ABNORMAL HIGH (ref 2.3–15.9)
RBC.: 2.42 MIL/uL — ABNORMAL LOW (ref 3.87–5.11)
Retic Count, Absolute: 92.7 10*3/uL (ref 19.0–186.0)
Retic Ct Pct: 3.8 % — ABNORMAL HIGH (ref 0.4–3.1)

## 2021-09-08 LAB — FOLATE: Folate: 12 ng/mL (ref >5.9)

## 2021-09-08 LAB — CK: Total CK: 30 U/L — ABNORMAL LOW (ref 38–234)

## 2021-09-08 LAB — CBC
HCT: 25.9 % — ABNORMAL LOW (ref 36.0–46.0)
Hemoglobin: 7.8 g/dL — ABNORMAL LOW (ref 12.0–15.0)
MCH: 31.8 pg (ref 26.0–34.0)
MCHC: 30.1 g/dL (ref 30.0–36.0)
MCV: 105.7 fL — ABNORMAL HIGH (ref 80.0–100.0)
Platelets: 146 10*3/uL — ABNORMAL LOW (ref 150–400)
RBC: 2.45 MIL/uL — ABNORMAL LOW (ref 3.87–5.11)
RDW: 18.7 % — ABNORMAL HIGH (ref 11.5–15.5)
WBC: 9 10*3/uL (ref 4.0–10.5)
nRBC: 0 % (ref 0.0–0.2)

## 2021-09-08 LAB — MAGNESIUM: Magnesium: 1.5 mg/dL — ABNORMAL LOW (ref 1.7–2.4)

## 2021-09-08 LAB — VITAMIN B12: Vitamin B-12: 942 pg/mL — ABNORMAL HIGH (ref 180–914)

## 2021-09-08 MED ORDER — QUETIAPINE FUMARATE 25 MG PO TABS
25.0000 mg | ORAL_TABLET | Freq: Every day | ORAL | Status: DC
  Administered 2021-09-08 – 2021-09-16 (×9): 25 mg
  Filled 2021-09-08 (×9): qty 1

## 2021-09-08 MED ORDER — POTASSIUM CHLORIDE 20 MEQ PO PACK
40.0000 meq | PACK | Freq: Once | ORAL | Status: AC
  Administered 2021-09-08: 40 meq via ORAL
  Filled 2021-09-08: qty 2

## 2021-09-08 MED ORDER — MAGNESIUM SULFATE 2 GM/50ML IV SOLN
2.0000 g | Freq: Once | INTRAVENOUS | Status: AC
  Administered 2021-09-08: 2 g via INTRAVENOUS
  Filled 2021-09-08: qty 50

## 2021-09-08 NOTE — Progress Notes (Signed)
PROGRESS NOTE  Kristy [name]:[id number] DOB: 1946/09/14   PCP: Darrow Bussing, MD  Patient is from: SNF.  DOA: 08/28/2021 LOS: 11  Chief complaints:  Chief Complaint  Patient presents with   Altered Mental Status     Brief Narrative / Interim history: 75 year old F with PMH of alcohol abuse, admitted on 06/10/2021 for encephalopathy, cervical spine discitis and osteomyelitis for which she underwent I&D with anterior cervical discectomy C5-C6, and treated with IV vancomycin and Rocephin, then cefazolin with rifampin, dysphagia and malnutrition requiring PEG tube on 07/11/2021 and urinary tension requiring Foley catheter.    Readmitted on 07/20/2021 with altered mental status on and found to have acute ischemic pontine infarct.  Patient was not on antiplatelet or anticoagulation due to thrombocytopenia and risk of bleeding.  Imaging also showed concern of discitis and osteomyelitis T12-L1 neurosurgery was reconsulted recommended no further surgical intervention.  ID was reconsulted on 07/24/2021 recommended to continue cefazolin.  Palliative care was consulted on 07/26/2021 and family wanted full scope of treatment. Patient was discharged back to [facility] on 08/06/2021 on IV Ancef through 08/25/2021.    Patient was sent back to ED on 08/28/2021 for shortness of breath and AMS.  She had a cardiac arrest while in ED requiring CPR and epinephrine with cardioversion, and admitted to ICU with respiratory failure requiring intubation and mechanical ventilation in the setting of possible aspiration pneumonia and encephalopathy.    Patient was extubated and transferred to [facility] service on 09/03/2021.  Main issue is recurrent hypoglycemia despite continuous tube feed.  Subjective: Seen and examined earlier this morning.  Was hypoglycemic to 49 about 8 PM last night.  CBG 66 this morning but improved to 91 after dextrose amp.  Patient has no complaints.  Denies pain, trouble breathing or  GI symptoms.  Objective: Vitals:   09/07/21 2359 09/08/21 0400 09/08/21 0807 09/08/21 1135  BP:   125/60 133/72  Pulse:   66 70  Resp:    17  Temp: 97.6 F (36.4 C) (!) 97 F (36.1 C) (!) 97.2 F (36.2 C)   TempSrc: Axillary Axillary Axillary   SpO2:   100% 100%  Weight:      Height:        Intake/Output Summary (Last 24 hours) at 09/08/2021 1157 Last data filed at 09/08/2021 0015 Gross per 24 hour  Intake 400 ml  Output 1400 ml  Net -1000 ml   Filed Weights   09/02/21 0450 09/04/21 0412 09/05/21 0816  Weight: 78 kg 75 kg 77.6 kg    Examination:  GENERAL: Chronically ill-appearing.  No apparent distress. HEENT: MMM.  Vision and hearing grossly intact.  NECK: Supple.  No apparent JVD.  RESP: 100% on RA.  No IWOB.  Fair aeration bilaterally. CVS:  RRR. Heart sounds normal.  ABD/GI/GU: BS+. Abd soft.  On TF via G-tube.  Indwelling Foley catheter in place. MSK/EXT:  Moves extremities. No apparent deformity.  Trace BLE edema. SKIN: Stage II sacral decubitus ulcer.  Dry skin globally. NEURO: Awake and alert. Oriented appropriately.  No apparent focal neuro deficit but globally weak PSYCH: Calm. Normal affect.   Procedures:  Intubation and extubation  Microbiology summarized: 08/27/2021 blood culture: Gram-positive cocci and Acinetobacter  Assessment & Plan: Acute respiratory failure with hypoxia possibly due to aspiration pneumonia s/p intubation secondary to cardiac arrest: Resolved but remains high risk for reintubation -Aspiration precaution -SLP recommended n.p.o.   Gram-positive cocci/Acinetobacter bacteremia and septic shock: Sepsis physiology resolved.  Repeat cultures negative.  CT abdomen and pelvis did not show source of infection. -ID rec-IV Unasyn in-house, and Bactrim on discharge to complete 14 days total from 10/19.   ATN due to sepsis: Resolved Recent Labs    08/29/21 0409 08/30/21 0436 08/31/21 0327 09/01/21 0250 09/02/21 1416 09/03/21 0417  09/04/21 0410 09/05/21 0224 09/06/21 0200 09/08/21 0330  BUN 46* 36* 34* 26* 22 19 20  26* 33* 27*  CREATININE 1.26* 1.25* 1.07* 0.88 0.91 0.77 0.84 0.96 0.97 0.69  -Monitor intermittently  Mild hyponatremia: Likely due to insensible loss.  Resolved with free water. -Decrease free water to 150 cc every 6 hours -Recheck sodium  Acute on chronic anemia of chronic: H&H stable after 1 unit on 10/21.   Thrombocytopenia: Improved. Recent Labs    08/31/21 0327 09/01/21 0250 09/01/21 0937 09/02/21 1416 09/03/21 0417 09/04/21 0410 09/05/21 0224 09/06/21 0200 09/07/21 0542 09/08/21 0329  HGB 7.8* 7.1* 7.1* 7.2* 8.2* 8.7* 8.0* 8.1* 8.3* 7.8*  -Monitor intermittently   Acute metabolic encephalopathy: Concern for ABI after cardiac arrest.  Fairly oriented. -Reorientation and delirium precautions.  Chronic indwelling Foley catheter: Exchange on her recent admission. Urine cultures NGTD. -Continue indwelling Foley  Recurrent hypoglycemia: CBG 49 last night, 66 this morning despite continuous TF at 60 cc an hour.  Previously on Solu-Cortef for the same issue.  Concern about adrenal insufficiency.  She is normotensive but on midodrine. Recent Labs  Lab 09/08/21 0013 09/08/21 0414 09/08/21 0811 09/08/21 0922 09/08/21 1153  GLUCAP 87 79 66* 91 87  -Check ACTH and a.m. cortisol -Reconsulted dietitian to reassess tube feed -May consider checking plasma insulin, insulin ab, BHA and/or glucagon   Acute CVA recent small pontine stroke: Not on AC or antiplatelet due to thrombocytopenia.  Thrombocytopenia is resolving now -Started chewable aspirin via G-tube on 10/29 -Continue statin  Profound muscular deconditioning: -Continue physical therapy to continue to work with her.  Hypotension: Resolved with low-dose midodrine.  Weaned off Solu-Cortef -Continue midodrine 5 mg 3 times daily   Goal of care counseling/headaches -Appreciate input by palliative care -Remains full code with full  scope of care  Pruritic skin rash-looks like eczematous skin rash but global. -Follow vitamin B1 and vitamin C levels -Thiamine and vitamin C supplementation -Try Benadryl instead of Atarax -Continue Eucerin cream  Hypomagnesemia/hypokalemia: Mg 1.5.  K3.6. -KCl 40x1 -IV magnesium sulfate 2 g x 1  Normocytic anemia: H&H stable.  Anemia panel basically normal. Recent Labs    08/31/21 0327 09/01/21 0250 09/01/21 0937 09/02/21 1416 09/03/21 0417 09/04/21 0410 09/05/21 0224 09/06/21 0200 09/07/21 0542 09/08/21 0329  HGB 7.8* 7.1* 7.1* 7.2* 8.2* 8.7* 8.0* 8.1* 8.3* 7.8*  -Continue monitoring  Thrombocytopenia: Resolving. Recent Labs  Lab 09/02/21 0556 09/02/21 1416 09/03/21 0417 09/04/21 0410 09/05/21 0224 09/06/21 0200 09/07/21 0542 09/08/21 0329  PLT 40* 44* 59* 95* 116* 138* 143* 146*  -Continue monitoring  Severe malnutrition/dysphagia: remains  Body mass index is 27.61 kg/m. Nutrition Problem: Severe Malnutrition Etiology: acute illness (bacteremia) Signs/Symptoms: mild fat depletion, moderate muscle depletion, percent weight loss (11.7% weight loss in less than 2 months) Percent weight loss: 11.7 % Interventions: Tube feeding, Prostat  Pressure skin injury: POA Pressure Injury 08/28/21 Coccyx Right Stage 2 -  Partial thickness loss of dermis presenting as a shallow open injury with a red, pink wound bed without slough. (Active)  08/28/21 1030  Location: Coccyx  Location Orientation: Right  Staging: Stage 2 -  Partial thickness loss of dermis presenting as  a shallow open injury with a red, pink wound bed without slough.  Wound Description (Comments):   Present on Admission: Yes     Pressure Injury 08/28/21 Sacrum Medial Stage 2 -  Partial thickness loss of dermis presenting as a shallow open injury with a red, pink wound bed without slough. (Active)  08/28/21 1030  Location: Sacrum  Location Orientation: Medial  Staging: Stage 2 -  Partial thickness  loss of dermis presenting as a shallow open injury with a red, pink wound bed without slough.  Wound Description (Comments):   Present on Admission:      Pressure Injury 08/28/21 Buttocks Right Stage 2 -  Partial thickness loss of dermis presenting as a shallow open injury with a red, pink wound bed without slough. (Active)  08/28/21 1030  Location: Buttocks  Location Orientation: Right  Staging: Stage 2 -  Partial thickness loss of dermis presenting as a shallow open injury with a red, pink wound bed without slough.  Wound Description (Comments):   Present on Admission:    DVT prophylaxis:  Place and maintain sequential compression device Start: 08/31/21 1102 SCDs Start: 08/28/21 1610  Code Status: Full code Family Communication: Updated patient's daughter over the phone on 10/28. Level of care: Progressive Status is: Inpatient  Remains inpatient appropriate because: Recurrent hypoglycemia despite continuous tube feed, and safe disposition    Consultants:  Palliative medicine Pulmonology Infectious disease   Sch Meds:  Scheduled Meds:  vitamin C  500 mg Per Tube Daily   aspirin  81 mg Per Tube Daily   atorvastatin  40 mg Per Tube q1800   chlorhexidine  15 mL Mouth Rinse BID   Chlorhexidine Gluconate Cloth  6 each Topical Daily   feeding supplement (PROSource TF)  45 mL Per Tube Daily   free water  200 mL Per Tube Q6H   hydrocerin   Topical BID   mouth rinse  15 mL Mouth Rinse q12n4p   midodrine  5 mg Per Tube TID WC   sodium chloride flush  10-40 mL Intracatheter Q12H   thiamine injection  100 mg Intravenous Daily   Continuous Infusions:  ampicillin-sulbactam (UNASYN) IV 3 g (09/08/21 1137)   feeding supplement (JEVITY 1.5 CAL/FIBER) 1,500 mL (09/07/21 1805)   PRN Meds:.acetaminophen, diphenhydrAMINE, docusate, influenza vaccine adjuvanted, melatonin, pneumococcal 23 valent vaccine, polyethylene glycol, sodium chloride flush  Antimicrobials: Anti-infectives (From  admission, onward)    Start     Dose/Rate Route Frequency Ordered Stop   09/05/21 1415  fluconazole (DIFLUCAN) tablet 150 mg  Status:  Discontinued        150 mg Per Tube Daily 09/05/21 1320 09/05/21 1320   09/05/21 1415  fluconazole (DIFLUCAN) 40 MG/ML suspension 200 mg        200 mg Per Tube  Once 09/05/21 1320 09/05/21 1524   08/29/21 1200  vancomycin (VANCOREADY) IVPB 1250 mg/250 mL  Status:  Discontinued        1,250 mg 166.7 mL/hr over 90 Minutes Intravenous Every 24 hours 08/28/21 1113 08/29/21 0726   08/29/21 1200  vancomycin (VANCOCIN) IVPB 1000 mg/200 mL premix        1,000 mg 200 mL/hr over 60 Minutes Intravenous Every 24 hours 08/29/21 0726 09/01/21 1413   08/28/21 2300  Ampicillin-Sulbactam (UNASYN) 3 g in sodium chloride 0.9 % 100 mL IVPB        3 g 200 mL/hr over 30 Minutes Intravenous Every 6 hours 08/28/21 2200 09/12/21 2359   08/28/21 1800  ceFEPIme (MAXIPIME) 2 g in sodium chloride 0.9 % 100 mL IVPB  Status:  Discontinued        2 g 200 mL/hr over 30 Minutes Intravenous Every 12 hours 08/28/21 1113 08/28/21 2200   08/28/21 1200  vancomycin (VANCOREADY) IVPB 1750 mg/350 mL        1,750 mg 175 mL/hr over 120 Minutes Intravenous  Once 08/28/21 1113 08/28/21 1348   08/28/21 0645  vancomycin (VANCOREADY) IVPB 1750 mg/350 mL  Status:  Discontinued        1,750 mg 175 mL/hr over 120 Minutes Intravenous  Once 08/28/21 0630 08/28/21 1139   08/28/21 0615  ceFEPIme (MAXIPIME) 2 g in sodium chloride 0.9 % 100 mL IVPB        2 g 200 mL/hr over 30 Minutes Intravenous  Once 08/28/21 0608 08/28/21 2045        I have personally reviewed the following labs and images: CBC: Recent Labs  Lab 09/04/21 0410 09/05/21 0224 09/06/21 0200 09/07/21 0542 09/08/21 0329  WBC 9.0 10.0 8.7 9.0 9.0  HGB 8.7* 8.0* 8.1* 8.3* 7.8*  HCT 27.1* 26.4* 27.3* 26.9* 25.9*  MCV 101.5* 103.1* 106.2* 105.1* 105.7*  PLT 95* 116* 138* 143* 146*   BMP &GFR Recent Labs  Lab 09/03/21 0417  09/04/21 0410 09/05/21 0224 09/06/21 0200 09/07/21 0542 09/07/21 1408 09/08/21 0329 09/08/21 0330  NA 142 142 142 146*  --  141  --  141  K 3.3* 3.7 3.4* 4.1  --   --   --  3.6  CL 104 104 108 113*  --   --   --  106  CO2 29 29 29 27   --   --   --  28  GLUCOSE 121* 115* 125* 99  --   --   --  97  BUN 19 20 26* 33*  --   --   --  27*  CREATININE 0.77 0.84 0.96 0.97  --   --   --  0.69  CALCIUM 9.0 9.0 8.7* 8.6*  --   --   --  8.7*  MG 1.7 1.3* 2.2 2.2 1.7  --  1.5*  --   PHOS 3.2 2.9 4.1 4.2  --   --   --  3.5   Estimated Creatinine Clearance: 63.9 mL/min (by C-G formula based on SCr of 0.69 mg/dL). Liver & Pancreas: Recent Labs  Lab 09/05/21 0224 09/06/21 0200 09/08/21 0330  ALBUMIN 2.2* 2.2* 2.1*   No results for input(s): LIPASE, AMYLASE in the last 168 hours. No results for input(s): AMMONIA in the last 168 hours. Diabetic: No results for input(s): HGBA1C in the last 72 hours. Recent Labs  Lab 09/08/21 0013 09/08/21 0414 09/08/21 0811 09/08/21 0922 09/08/21 1153  GLUCAP 87 79 66* 91 87   Cardiac Enzymes: Recent Labs  Lab 09/08/21 0329  CKTOTAL 30*   No results for input(s): PROBNP in the last 8760 hours. Coagulation Profile: Recent Labs  Lab 09/02/21 0556  INR 1.2   Thyroid Function Tests: No results for input(s): TSH, T4TOTAL, FREET4, T3FREE, THYROIDAB in the last 72 hours. Lipid Profile: No results for input(s): CHOL, HDL, LDLCALC, TRIG, CHOLHDL, LDLDIRECT in the last 72 hours. Anemia Panel: Recent Labs    09/08/21 0329 09/08/21 0500  VITAMINB12 942*  --   FERRITIN 188  --   TIBC 242*  --   IRON 47  --   RETICCTPCT  --  3.8*    Urine analysis:  Component Value Date/Time   COLORURINE YELLOW 08/28/2021 1205   APPEARANCEUR CLEAR 08/28/2021 1205   LABSPEC 1.020 08/28/2021 1205   PHURINE 5.5 08/28/2021 1205   GLUCOSEU NEGATIVE 08/28/2021 1205   HGBUR SMALL (A) 08/28/2021 1205   BILIRUBINUR NEGATIVE 08/28/2021 1205   KETONESUR NEGATIVE  08/28/2021 1205   PROTEINUR NEGATIVE 08/28/2021 1205   UROBILINOGEN 0.2 05/14/2008 0020   NITRITE NEGATIVE 08/28/2021 1205   LEUKOCYTESUR SMALL (A) 08/28/2021 1205   Sepsis Labs: Invalid input(s): PROCALCITONIN, LACTICIDVEN  Microbiology: Recent Results (from the past 240 hour(s))  Culture, blood (Routine X 2) w Reflex to ID Panel     Status: None   Collection Time: 08/29/21 12:20 PM   Specimen: BLOOD LEFT HAND  Result Value Ref Range Status   Specimen Description BLOOD LEFT HAND  Final   Special Requests   Final    BOTTLES DRAWN AEROBIC AND ANAEROBIC Blood Culture results may not be optimal due to an inadequate volume of blood received in culture bottles   Culture   Final    NO GROWTH 5 DAYS Performed at [facility], [address] [place], [place] [address]    Report Status 09/03/2021 FINAL  Final    Radiology Studies: No results found.    Mostafa Yuan T. Rhyland Hinderliter [facility]  If 7PM-7AM, please contact night-coverage [email or web address] 09/08/2021, 11:57 AM

## 2021-09-08 NOTE — Progress Notes (Signed)
Speech Language Pathology Treatment: Dysphagia  Patient Details Name: Kristy [name] MRN: [id number] DOB: Apr 03, 1946 Today's Date: 09/08/2021 Time: 0240-9735 SLP Time Calculation (min) (ACUTE ONLY): 8 min  Assessment / Plan / Recommendation Clinical Impression  Pt was seen for dysphagia treatment. She was alert throughout the session and communicated verbally with some disorientation noted. No s/sx of aspiration were noted with thin liquids via straw. She refused all solids despite encouragement stating that she "may want some later". A clear liquid diet will be initiated at this time. SLP will follow to assess tolerance and for diet advancement as clinically indicated.    HPI HPI: Patient is a 75 y.o. female with PMH: ETOH abuse, prior admission on 06/10/2021 for encephalopathy, cervical spine discitis and osteomyelitis for which she underwent I&D with ACDF C5-C6, dysphagia and malnutrition requiring PEG tube on 07/11/2021 and urinary tension requiring Foley catheter. 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 She was discharged 9/27 to SNF.   Patient presented to ED 10/19 for SOB and AMS. She had a cardiac arrest while in ED requiring CPR. She was admitted to ICU with respiratory failure requiring intubation and mechanical ventilation in setting of possible aspiration PNA and encephalopathy. She was extubated on 10/25. MBS during previous hospital stay on 8/15 revealed moderately impaired dysphagia from both mechanical and cognitive components. Patient was upgraded to dys 3 solids and thin liquids however PEG was placed due to poor intake.      SLP Plan  Continue with current plan of care      Recommendations for follow up therapy are one component of a multi-disciplinary discharge planning process, led by the attending physician.  Recommendations may be updated based on patient status, additional functional criteria and insurance authorization.    Recommendations   Diet recommendations: Thin liquid (clear liquids) Liquids provided via: Cup;Straw Medication Administration: Via alternative means Supervision: Trained caregiver to feed patient Compensations: Slow rate;Small sips/bites Postural Changes and/or Swallow Maneuvers: Seated upright 90 degrees                Oral Care Recommendations: Oral care BID;Staff/trained caregiver to provide oral care Follow up Recommendations: 24 hour supervision/assistance;Skilled Nursing facility SLP Visit Diagnosis: Dysphagia, unspecified (R13.10) Plan: Continue with current plan of care       Kristy [name] I. Vear Clock, MS, CCC-SLP Acute Rehabilitation Services Office number [phone] Pager [phone]                Kristy [name]  09/08/2021, 11:03 AM

## 2021-09-08 NOTE — TOC Progression Note (Signed)
Transition of Care [facility]) - Progression Note    Patient Details  Name: Kristy [name] MRN: [id number] Date of Birth: [birth year]  [facility]) CM/SW Contact  Lorri Frederick, LCSW Phone Number: 09/08/2021, 2:56 PM  Clinical Narrative:   CSW received phone call from daughter [name].  She has spoken to palliative care through [facility].  Asked about HH arrangements and DME.  Dicussed need for hospital bed, lift, wheelchair, and 3n1.  Daughter has arranged with family to have 24 hour care for pt at home.    Expected Discharge Plan:  (TBD-Home with palliative care potentially) Barriers to Discharge: Other (must enter comment) (family making arrangements for long term care at home)  Expected Discharge Plan and Services Expected Discharge Plan:  (TBD-Home with palliative care potentially)   Discharge Planning Services: CM Consult   Living arrangements for the past 2 months: Apartment                                       Social Determinants of Health (SDOH) Interventions    Readmission Risk Interventions No flowsheet data found.

## 2021-09-08 NOTE — Progress Notes (Signed)
Hypoglycemic Event  CBG: 66  Treatment: 8 oz juice/soda  Symptoms: None  Follow-up CBG: Time:0922 CBG Result:91  Possible Reasons for Event: Unknown  Comments/MD notified:MD G[place] notified.    Keshawna Dix [place]

## 2021-09-09 DIAGNOSIS — R7989 Other specified abnormal findings of blood chemistry: Secondary | ICD-10-CM

## 2021-09-09 LAB — CBC
HCT: 25.4 % — ABNORMAL LOW (ref 36.0–46.0)
Hemoglobin: 7.8 g/dL — ABNORMAL LOW (ref 12.0–15.0)
MCH: 31.8 pg (ref 26.0–34.0)
MCHC: 30.7 g/dL (ref 30.0–36.0)
MCV: 103.7 fL — ABNORMAL HIGH (ref 80.0–100.0)
Platelets: 149 10*3/uL — ABNORMAL LOW (ref 150–400)
RBC: 2.45 MIL/uL — ABNORMAL LOW (ref 3.87–5.11)
RDW: 18.3 % — ABNORMAL HIGH (ref 11.5–15.5)
WBC: 8 10*3/uL (ref 4.0–10.5)
nRBC: 0 % (ref 0.0–0.2)

## 2021-09-09 LAB — RENAL FUNCTION PANEL
Albumin: 2.1 g/dL — ABNORMAL LOW (ref 3.5–5.0)
Anion gap: 6 (ref 5–15)
BUN: 24 mg/dL — ABNORMAL HIGH (ref 8–23)
CO2: 28 mmol/L (ref 22–32)
Calcium: 8.6 mg/dL — ABNORMAL LOW (ref 8.9–10.3)
Chloride: 103 mmol/L (ref 98–111)
Creatinine, Ser: 0.69 mg/dL (ref 0.44–1.00)
GFR, Estimated: >60 mL/min (ref >60)
Glucose, Bld: 106 mg/dL — ABNORMAL HIGH (ref 70–99)
Phosphorus: 3.6 mg/dL (ref 2.5–4.6)
Potassium: 3.7 mmol/L (ref 3.5–5.1)
Sodium: 137 mmol/L (ref 135–145)

## 2021-09-09 LAB — GLUCOSE, CAPILLARY
Glucose-Capillary: 107 mg/dL — ABNORMAL HIGH (ref 70–99)
Glucose-Capillary: 113 mg/dL — ABNORMAL HIGH (ref 70–99)
Glucose-Capillary: 60 mg/dL — ABNORMAL LOW (ref 70–99)
Glucose-Capillary: 72 mg/dL (ref 70–99)
Glucose-Capillary: 90 mg/dL (ref 70–99)
Glucose-Capillary: 92 mg/dL (ref 70–99)
Glucose-Capillary: 93 mg/dL (ref 70–99)

## 2021-09-09 LAB — CORTISOL-AM, BLOOD: Cortisol - AM: 9.6 ug/dL (ref 6.7–22.6)

## 2021-09-09 LAB — TSH: TSH: 57.084 u[IU]/mL — ABNORMAL HIGH (ref 0.350–4.500)

## 2021-09-09 LAB — MAGNESIUM: Magnesium: 1.9 mg/dL (ref 1.7–2.4)

## 2021-09-09 LAB — T4, FREE: Free T4: 0.6 ng/dL — ABNORMAL LOW (ref 0.61–1.12)

## 2021-09-09 LAB — CK: Total CK: 29 U/L — ABNORMAL LOW (ref 38–234)

## 2021-09-09 MED ORDER — JUVEN PO PACK
1.0000 | PACK | Freq: Two times a day (BID) | ORAL | Status: DC
  Administered 2021-09-09 – 2021-09-16 (×14): 1
  Filled 2021-09-09 (×16): qty 1

## 2021-09-09 MED ORDER — DEXTROSE 50 % IV SOLN: 12.5000 g | INTRAVENOUS | Status: AC

## 2021-09-09 MED ORDER — LEVOTHYROXINE SODIUM 100 MCG PO TABS
100.0000 ug | ORAL_TABLET | Freq: Every day | ORAL | Status: DC
  Administered 2021-09-09 – 2021-09-10 (×2): 100 ug via ORAL
  Filled 2021-09-09 (×2): qty 1

## 2021-09-09 MED ORDER — DEXTROSE 50 % IV SOLN
INTRAVENOUS | Status: AC
  Filled 2021-09-09: qty 50

## 2021-09-09 MED ORDER — DEXTROSE 50 % IV SOLN
12.5000 g | Freq: Once | INTRAVENOUS | Status: AC
  Administered 2021-09-09: 12.5 g via INTRAVENOUS

## 2021-09-09 MED ORDER — FREE WATER
100.0000 mL | Freq: Four times a day (QID) | Status: DC
  Administered 2021-09-09 – 2021-09-14 (×20): 100 mL

## 2021-09-09 NOTE — Progress Notes (Signed)
SLP Cancellation Note  Patient Details Name: Kristy [name] MRN: [id number] DOB: 04-13-1946   Cancelled treatment:       Reason Eval/Treat Not Completed: Patient declined, no reason specified  Unable to assess pt tolerance of clear liquids and readiness to advance, as pt refused all PO presentations. Will continue efforts.   Kristy [name], [facility], CCC-SLP Speech Language Pathologist Office: [phone]  [name] 09/09/2021, 2:18 PM

## 2021-09-09 NOTE — TOC Progression Note (Signed)
Transition of Care [facility]) - Progression Note    Patient Details  Name: Kristy [name] MRN: [id number] Date of Birth: 19-Jul-1946  Transition [facility]) CM/SW Contact  Lorri Frederick, LCSW Phone Number: 09/09/2021, 2:34 PM  Clinical Narrative:   DME ordered from [employer].  Darral Dash will work on possible delivery today  1415: Update from [place].  Pt only has medicare part A, daughter is going to self pay on the equipment.  They are working with [facility], trying to get delivered today but at latest tomorrow morning.      Expected Discharge Plan:  (TBD-Home with palliative care potentially) Barriers to Discharge: Other (must enter comment) (family making arrangements for long term care at home)  Expected Discharge Plan and Services Expected Discharge Plan:  (TBD-Home with palliative care potentially)   Discharge Planning Services: CM Consult   Living arrangements for the past 2 months: Apartment                 DME Arranged: 3-N-1, Wheelchair manual, Hospital bed, Other see comment (hoyer lift)                     Social Determinants of Health (SDOH) Interventions    Readmission Risk Interventions No flowsheet data found.

## 2021-09-09 NOTE — Progress Notes (Signed)
PROGRESS NOTE  Kristy [name] [id number] DOB: 04/22/1946   PCP: Lujean Amel, MD  Patient is from: SNF.  DOA: 08/28/2021 LOS: 12  Chief complaints:  Chief Complaint  Patient presents with   Altered Mental Status     Brief Narrative / Interim history: 75 year old F with PMH of alcohol abuse, admitted on 06/10/2021 for encephalopathy, cervical spine discitis and osteomyelitis for which she underwent I&D with anterior cervical discectomy C5-C6, and treated with IV vancomycin and Rocephin, then cefazolin with rifampin, dysphagia and malnutrition requiring PEG tube on 07/11/2021 and urinary tension requiring Foley catheter.    Readmitted on 07/20/2021 with altered mental status on and found to have acute ischemic pontine infarct.  Patient was not on antiplatelet or anticoagulation due to thrombocytopenia and risk of bleeding.  Imaging also showed concern of discitis and osteomyelitis T12-L1 neurosurgery was reconsulted recommended no further surgical intervention.  ID was reconsulted on 07/24/2021 recommended to continue cefazolin.  Palliative care was consulted on 07/26/2021 and family wanted full scope of treatment. Patient was discharged back to [facility] on 08/06/2021 on IV Ancef through 08/25/2021.    Patient was sent back to ED on 08/28/2021 for shortness of breath and AMS.  She had a cardiac arrest while in ED requiring CPR and epinephrine with cardioversion, and admitted to ICU with respiratory failure requiring intubation and mechanical ventilation in the setting of possible aspiration pneumonia and encephalopathy.    Patient was extubated and transferred to [facility] on 09/03/2021.  Main issue is recurrent hypoglycemia despite continuous tube feed.  A.m. cortisol within normal.  TSH elevated to 57.  Subjective: Seen and examined earlier this morning.  No major events overnight of this morning.  Patient was confused yesterday evening and is started on low-dose Seroquel  and bilateral mittens.  Looks calm today.  No complaints but likes to sleep.  Jokingly asking for beer.   Objective: Vitals:   09/08/21 2000 09/09/21 0000 09/09/21 0440 09/09/21 0730  BP:    128/66  Pulse:    78  Resp:    19  Temp: (!) 96.5 F (35.8 C) (!) 96.4 F (35.8 C) 98.6 F (37 C) 98.4 F (36.9 C)  TempSrc: Axillary Axillary Oral Axillary  SpO2:    100%  Weight:      Height:        Intake/Output Summary (Last 24 hours) at 09/09/2021 1008 Last data filed at 09/09/2021 0300 Gross per 24 hour  Intake 1230 ml  Output 2150 ml  Net -920 ml   Filed Weights   09/02/21 0450 09/04/21 0412 09/05/21 0816  Weight: 78 kg 75 kg 77.6 kg    Examination  GENERAL: No apparent distress.  Nontoxic. HEENT: MMM.  Vision and hearing grossly intact.  NECK: Supple.  No apparent JVD.  RESP: 100% on RA.  No IWOB.  Fair aeration bilaterally. CVS:  RRR. Heart sounds normal.  ABD/GI/GU: BS+. Abd soft, NTND.  On TF via G-tube.  Indwelling Foley catheter in place. MSK/EXT:  Moves extremities. No apparent deformity.  Trace BLE edema. SKIN: Dry skin globally. NEURO: Awake and alert. Oriented fairly.  No apparent focal neuro deficit but generalized weakness PSYCH: Calm. Normal affect.   Procedures:  Intubation and extubation  Microbiology summarized: 08/27/2021 blood culture: Gram-positive cocci and Acinetobacter  Assessment & Plan: Acute respiratory failure with hypoxia possibly due to aspiration pneumonia s/p intubation secondary to cardiac arrest: Resolved but remains high risk for reintubation -Aspiration precaution -SLP recommended n.p.o.  Gram-positive cocci/Acinetobacter bacteremia and septic shock: sepsis physiology resolved.  Repeat cultures negative.  CT abdomen and pelvis did not show source of infection. -ID rec-IV Unasyn in-house, and Bactrim on discharge to complete 14 days total from 10/19.   ATN due to sepsis: Resolved Recent Labs    08/30/21 0436 08/31/21 0327  09/01/21 0250 09/02/21 1416 09/03/21 0417 09/04/21 0410 09/05/21 0224 09/06/21 0200 09/08/21 0330 09/09/21 0409  BUN 36* 34* 26* 22 19 20  26* 33* 27* 24*  CREATININE 1.25* 1.07* 0.88 0.91 0.77 0.84 0.96 0.97 0.69 0.69  -Monitor intermittently  Mild hyponatremia: Likely due to insensible loss.  Resolved with free water. -Decrease free water to 100 cc every 6 hours. -Recheck sodium  Acute on chronic anemia of chronic: H&H stable after 1 unit on 10/21.   Thrombocytopenia: Improved. Recent Labs    09/01/21 0250 09/01/21 0937 09/02/21 1416 09/03/21 0417 09/04/21 0410 09/05/21 0224 09/06/21 0200 09/07/21 0542 09/08/21 0329 09/09/21 0409  HGB 7.1* 7.1* 7.2* 8.2* 8.7* 8.0* 8.1* 8.3* 7.8* 7.8*  -Monitor intermittently   Acute metabolic encephalopathy: concern for ABI after cardiac arrest.  Now markedly elevated TSH to 57 concerning for severe hypothyroidism.  Fairly oriented. -Reorientation and delirium precautions. -Check free T4 and repeat TSH  Chronic indwelling Foley catheter: Exchange on her recent admission. Urine cultures NGTD. -Continue indwelling Foley  Recurrent hypoglycemia: Euglycemic for the last 24 hours.  A.m. cortisol within normal arguing against AI.  She is also normotensive although on low-dose midodrine.  TSH 57 concerning for hypothyroidism.  Her TSH was 3.8 in August 2022.  Recent Labs  Lab 09/08/21 1153 09/08/21 2029 09/09/21 0033 09/09/21 0439 09/09/21 0833  GLUCAP 87 81 93 72 113*  -Follow ACTH and free T4 -Reconsulted dietitian to reassess tube feed -Will start Synthroid after free T4   Acute CVA recent small pontine stroke: Not on AC or antiplatelet due to thrombocytopenia.  Thrombocytopenia is resolving now -Started chewable aspirin via G-tube on 10/29 -Continue statin  Profound muscular deconditioning: Unclear etiology but multiple potentials including CVA, possible ABI after cardiac arrest and hypothyroidism -Continue physical therapy to  continue to work with her. -Treat treatable causes  Hypotension: Normotensive off Solu-Cortef on low-dose midodrine. -Continue midodrine 5 mg 3 times daily -Follow-up free T4 and repeat TSH and to start Synthroid as appropriate   Goal of care counseling/headaches -Appreciate input by palliative care -Remains full code with full scope of care  Markedly elevated TSH concerning for profound hypothyroidism: TSH  57.  This might explain most of his symptoms including hypotension, profound weakness, dry skin and hypoglycemia.  -Check free T4, repeat TSH and start Synthroid as appropriate  Pruritic skin rash-dry skin globally which could be due to hypothyroidism.  TSH is 57. -Follow vitamin B1 and vitamin C levels -Thiamine and vitamin C supplementation -Try Benadryl instead of Atarax -Continue Eucerin cream -Monitor hypothyroidism as above  Hypomagnesemia/hypokalemia: Resolved.  Normocytic anemia: H&H stable.  Anemia panel basically normal. Recent Labs    09/01/21 0250 09/01/21 0937 09/02/21 1416 09/03/21 0417 09/04/21 0410 09/05/21 0224 09/06/21 0200 09/07/21 0542 09/08/21 0329 09/09/21 0409  HGB 7.1* 7.1* 7.2* 8.2* 8.7* 8.0* 8.1* 8.3* 7.8* 7.8*  -Continue monitoring  Thrombocytopenia: Resolving. Recent Labs  Lab 09/02/21 1416 09/03/21 0417 09/04/21 0410 09/05/21 0224 09/06/21 0200 09/07/21 0542 09/08/21 0329 09/09/21 0409  PLT 44* 59* 95* 116* 138* 143* 146* 149*  -Continue monitoring  Severe malnutrition/dysphagia: remains n.p.o. per SLP Body mass index is 27.61 kg/m. Nutrition  Problem: Severe Malnutrition Etiology: acute illness (bacteremia) Signs/Symptoms: mild fat depletion, moderate muscle depletion, percent weight loss (11.7% weight loss in less than 2 months) Percent weight loss: 11.7 % Interventions: Tube feeding, Prostat  Pressure skin injury: POA Pressure Injury 08/28/21 Coccyx Right Stage 2 -  Partial thickness loss of dermis presenting as a  shallow open injury with a red, pink wound bed without slough. (Active)  08/28/21 1030  Location: Coccyx  Location Orientation: Right  Staging: Stage 2 -  Partial thickness loss of dermis presenting as a shallow open injury with a red, pink wound bed without slough.  Wound Description (Comments):   Present on Admission: Yes     Pressure Injury 08/28/21 Sacrum Medial Stage 2 -  Partial thickness loss of dermis presenting as a shallow open injury with a red, pink wound bed without slough. (Active)  08/28/21 1030  Location: Sacrum  Location Orientation: Medial  Staging: Stage 2 -  Partial thickness loss of dermis presenting as a shallow open injury with a red, pink wound bed without slough.  Wound Description (Comments):   Present on Admission:      Pressure Injury 08/28/21 Buttocks Right Stage 2 -  Partial thickness loss of dermis presenting as a shallow open injury with a red, pink wound bed without slough. (Active)  08/28/21 1030  Location: Buttocks  Location Orientation: Right  Staging: Stage 2 -  Partial thickness loss of dermis presenting as a shallow open injury with a red, pink wound bed without slough.  Wound Description (Comments):   Present on Admission:    DVT prophylaxis:  Place and maintain sequential compression device Start: 08/31/21 1102 SCDs Start: 08/28/21 0819  Code Status: Full code Family Communication: Updated patient's daughter over the phone Level of care: Progressive Status is: Inpatient  Remains inpatient appropriate because: Evaluation for hypothyroidism, recurrent hypoglycemia despite continuous tube feed, and safe disposition    Consultants:  Palliative medicine Pulmonology Infectious disease   Sch Meds:  Scheduled Meds:  vitamin C  500 mg Per Tube Daily   aspirin  81 mg Per Tube Daily   atorvastatin  40 mg Per Tube q1800   chlorhexidine  15 mL Mouth Rinse BID   Chlorhexidine Gluconate Cloth  6 each Topical Daily   feeding supplement  (PROSource TF)  45 mL Per Tube Daily   free water  200 mL Per Tube Q6H   hydrocerin   Topical BID   mouth rinse  15 mL Mouth Rinse q12n4p   midodrine  5 mg Per Tube TID WC   QUEtiapine  25 mg Per Tube QHS   sodium chloride flush  10-40 mL Intracatheter Q12H   thiamine injection  100 mg Intravenous Daily   Continuous Infusions:  ampicillin-sulbactam (UNASYN) IV 3 g (09/09/21 0602)   feeding supplement (JEVITY 1.5 CAL/FIBER) 1,000 mL (09/09/21 0146)   PRN Meds:.acetaminophen, diphenhydrAMINE, docusate, influenza vaccine adjuvanted, melatonin, pneumococcal 23 valent vaccine, polyethylene glycol, sodium chloride flush  Antimicrobials: Anti-infectives (From admission, onward)    Start     Dose/Rate Route Frequency Ordered Stop   09/05/21 1415  fluconazole (DIFLUCAN) tablet 150 mg  Status:  Discontinued        150 mg Per Tube Daily 09/05/21 1320 09/05/21 1320   09/05/21 1415  fluconazole (DIFLUCAN) 40 MG/ML suspension 200 mg        200 mg Per Tube  Once 09/05/21 1320 09/05/21 1524   08/29/21 1200  vancomycin (VANCOREADY) IVPB 1250 mg/250 mL  Status:  Discontinued        1,250 mg 166.7 mL/hr over 90 Minutes Intravenous Every 24 hours 08/28/21 1113 08/29/21 0726   08/29/21 1200  vancomycin (VANCOCIN) IVPB 1000 mg/200 mL premix        1,000 mg 200 mL/hr over 60 Minutes Intravenous Every 24 hours 08/29/21 0726 09/01/21 1413   08/28/21 2300  Ampicillin-Sulbactam (UNASYN) 3 g in sodium chloride 0.9 % 100 mL IVPB        3 g 200 mL/hr over 30 Minutes Intravenous Every 6 hours 08/28/21 2200 09/12/21 2359   08/28/21 1800  ceFEPIme (MAXIPIME) 2 g in sodium chloride 0.9 % 100 mL IVPB  Status:  Discontinued        2 g 200 mL/hr over 30 Minutes Intravenous Every 12 hours 08/28/21 1113 08/28/21 2200   08/28/21 1200  vancomycin (VANCOREADY) IVPB 1750 mg/350 mL        1,750 mg 175 mL/hr over 120 Minutes Intravenous  Once 08/28/21 1113 08/28/21 1348   08/28/21 0645  vancomycin (VANCOREADY) IVPB 1750  mg/350 mL  Status:  Discontinued        1,750 mg 175 mL/hr over 120 Minutes Intravenous  Once 08/28/21 0630 08/28/21 1139   08/28/21 0615  ceFEPIme (MAXIPIME) 2 g in sodium chloride 0.9 % 100 mL IVPB        2 g 200 mL/hr over 30 Minutes Intravenous  Once 08/28/21 0608 08/28/21 2045        I have personally reviewed the following labs and images: CBC: Recent Labs  Lab 09/05/21 0224 09/06/21 0200 09/07/21 0542 09/08/21 0329 09/09/21 0409  WBC 10.0 8.7 9.0 9.0 8.0  HGB 8.0* 8.1* 8.3* 7.8* 7.8*  HCT 26.4* 27.3* 26.9* 25.9* 25.4*  MCV 103.1* 106.2* 105.1* 105.7* 103.7*  PLT 116* 138* 143* 146* 149*   BMP &GFR Recent Labs  Lab 09/04/21 0410 09/05/21 0224 09/06/21 0200 09/07/21 0542 09/07/21 1408 09/08/21 0329 09/08/21 0330 09/09/21 0409  NA 142 142 146*  --  141  --  141 137  K 3.7 3.4* 4.1  --   --   --  3.6 3.7  CL 104 108 113*  --   --   --  106 103  CO2 29 29 27   --   --   --  28 28  GLUCOSE 115* 125* 99  --   --   --  97 106*  BUN 20 26* 33*  --   --   --  27* 24*  CREATININE 0.84 0.96 0.97  --   --   --  0.69 0.69  CALCIUM 9.0 8.7* 8.6*  --   --   --  8.7* 8.6*  MG 1.3* 2.2 2.2 1.7  --  1.5*  --  1.9  PHOS 2.9 4.1 4.2  --   --   --  3.5 3.6   Estimated Creatinine Clearance: 63.9 mL/min (by C-G formula based on SCr of 0.69 mg/dL). Liver & Pancreas: Recent Labs  Lab 09/05/21 0224 09/06/21 0200 09/08/21 0330 09/09/21 0409  ALBUMIN 2.2* 2.2* 2.1* 2.1*   No results for input(s): LIPASE, AMYLASE in the last 168 hours. No results for input(s): AMMONIA in the last 168 hours. Diabetic: No results for input(s): HGBA1C in the last 72 hours. Recent Labs  Lab 09/08/21 1153 09/08/21 2029 09/09/21 0033 09/09/21 0439 09/09/21 0833  GLUCAP 87 81 93 72 113*   Cardiac Enzymes: Recent Labs  Lab 09/08/21 0329 09/09/21 0409  CKTOTAL 30* 29*  No results for input(s): PROBNP in the last 8760 hours. Coagulation Profile: No results for input(s): INR, PROTIME in  the last 168 hours.  Thyroid Function Tests: Recent Labs    09/09/21 0409  TSH 57.084*   Lipid Profile: No results for input(s): CHOL, HDL, LDLCALC, TRIG, CHOLHDL, LDLDIRECT in the last 72 hours. Anemia Panel: Recent Labs    09/08/21 0329 09/08/21 0500  VITAMINB12 942*  --   FOLATE  --  12.0  FERRITIN 188  --   TIBC 242*  --   IRON 47  --   RETICCTPCT  --  3.8*    Urine analysis:    Component Value Date/Time   COLORURINE YELLOW 08/28/2021 1205   APPEARANCEUR CLEAR 08/28/2021 1205   LABSPEC 1.020 08/28/2021 1205   PHURINE 5.5 08/28/2021 1205   GLUCOSEU NEGATIVE 08/28/2021 1205   HGBUR SMALL (A) 08/28/2021 1205   BILIRUBINUR NEGATIVE 08/28/2021 1205   KETONESUR NEGATIVE 08/28/2021 1205   PROTEINUR NEGATIVE 08/28/2021 1205   UROBILINOGEN 0.2 05/14/2008 0020   NITRITE NEGATIVE 08/28/2021 1205   LEUKOCYTESUR SMALL (A) 08/28/2021 1205   Sepsis Labs: Invalid input(s): PROCALCITONIN, LACTICIDVEN  Microbiology: No results found for this or any previous visit (from the past 240 hour(s)).   Radiology Studies: No results found.    Yareth Macdonnell T. Agueda Houpt [facility]  If 7PM-7AM, please contact night-coverage [email or web address] 09/09/2021, 10:08 AM

## 2021-09-09 NOTE — Progress Notes (Signed)
Nutrition Follow-up  DOCUMENTATION CODES:   Severe malnutrition in context of acute illness/injury  INTERVENTION:   Continue tube feeding via PEG: -Jevity 1.5 @ 60 ml/hr (1440 ml/day) - ProSource TF 45 ml daily -free water per MD, currently 161m Q6H   Tube feeding regimen provides 2220 kcal, 103 grams of protein, and 1094 ml of H2O (1499mtotal free water with flushes).   -1 packet Juven BID per tube, each packet provides 95 calories, 2.5 grams of protein (collagen), and 9.8 grams of carbohydrate (3 grams sugar); also contains 7 grams of L-arginine and L-glutamine, 300 mg vitamin C, 15 mg vitamin E, 1.2 mcg vitamin B-12, 9.5 mg zinc, 200 mg calcium, and 1.5 g  Calcium Beta-hydroxy-Beta-methylbutyrate to support wound healing  NUTRITION DIAGNOSIS:   Severe Malnutrition related to acute illness (bacteremia) as evidenced by mild fat depletion, moderate muscle depletion, percent weight loss (11.7% weight loss in less than 2 months).  Ongoing, being addressed via TF  GOAL:   Patient will meet greater than or equal to 90% of their needs  Met via TF  MONITOR:   Labs, Weight trends, TF tolerance, Diet advancement, Skin, I & O's  REASON FOR ASSESSMENT:   Ventilator, Consult Enteral/tube feeding initiation and management  ASSESSMENT:   [age]ear old female who presented to the ED from SNF on 10/19 with AMS. 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 Pt required intubation in the ED and suffered cardiac arrest x 2 in the ED. Pt admitted with septic shock, AKI.  10/22 - [facility]  Discussed pt with RN. Pt tolerating current TF without issues but continues to have recurrent hypoglycemic episodes per MD. Pt was previously on D10 drip to aid in blood sugar control, but this has since been discontinued. Only one hypoglycemic event noted over last 36 hours, so will not adjust regimen at this time given  pt tolerating and denies N/V/pain.   Admit weight: 84.1 kg Current weight: 77.6 kg  No edema noted per RN edema assessment.   Current TF: Jevity 1.5 @ 60 ml/hr, ProSource TF 45 ml daily, 10066mree water Q6H  Medications: vitamin c, thiamine, IV abx  Labs reviewed. CBGs: 72-113 x 24 hours  UOP: 2150 ml x 24 hours I/Os: -7023 L since admit  Diet Order:   Diet Order             Diet NPO time specified  Diet effective now                   EDUCATION NEEDS:   Not appropriate for education at this time  Skin:  Skin Assessment: Skin Integrity Issues: Stage II: R coccyx, sacrum, R buttock Other: MASD to bialteral buttocks  Last BM:  10/29  Height:   Ht Readings from Last 1 Encounters:  08/28/21 '5\' 6"'  (1.676 m)    Weight:   Wt Readings from Last 1 Encounters:  09/05/21 77.6 kg    BMI:  Body mass index is 27.61 kg/m.  Estimated Nutritional Needs:   Kcal:  2000-2200  Protein:  100-115 grams  Fluid:  >/= 2.0 L   AmaLarkin InaS, RD, LDN (she/her/hers) RD pager number and weekend/on-call pager number located in [place]

## 2021-09-10 ENCOUNTER — Inpatient Hospital Stay (HOSPITAL_COMMUNITY): Payer: Medicare Other

## 2021-09-10 DIAGNOSIS — E039 Hypothyroidism, unspecified: Secondary | ICD-10-CM

## 2021-09-10 LAB — VITAMIN B1: Vitamin B1 (Thiamine): 291.6 nmol/L — ABNORMAL HIGH (ref 66.5–200.0)

## 2021-09-10 LAB — GLUCOSE, CAPILLARY
Glucose-Capillary: 106 mg/dL — ABNORMAL HIGH (ref 70–99)
Glucose-Capillary: 65 mg/dL — ABNORMAL LOW (ref 70–99)
Glucose-Capillary: 80 mg/dL (ref 70–99)
Glucose-Capillary: 84 mg/dL (ref 70–99)
Glucose-Capillary: 88 mg/dL (ref 70–99)
Glucose-Capillary: 93 mg/dL (ref 70–99)

## 2021-09-10 LAB — ACTH: C206 ACTH: 7.8 pg/mL (ref 7.2–63.3)

## 2021-09-10 IMAGING — DX DG CHEST 1V PORT
1 series · 1 of 1 positions shown · non-contrast
Comparison: Chest radiograph [DATE]

CLINICAL DATA: PICC placement.

EXAM:
PORTABLE CHEST 1 VIEW

[chest]
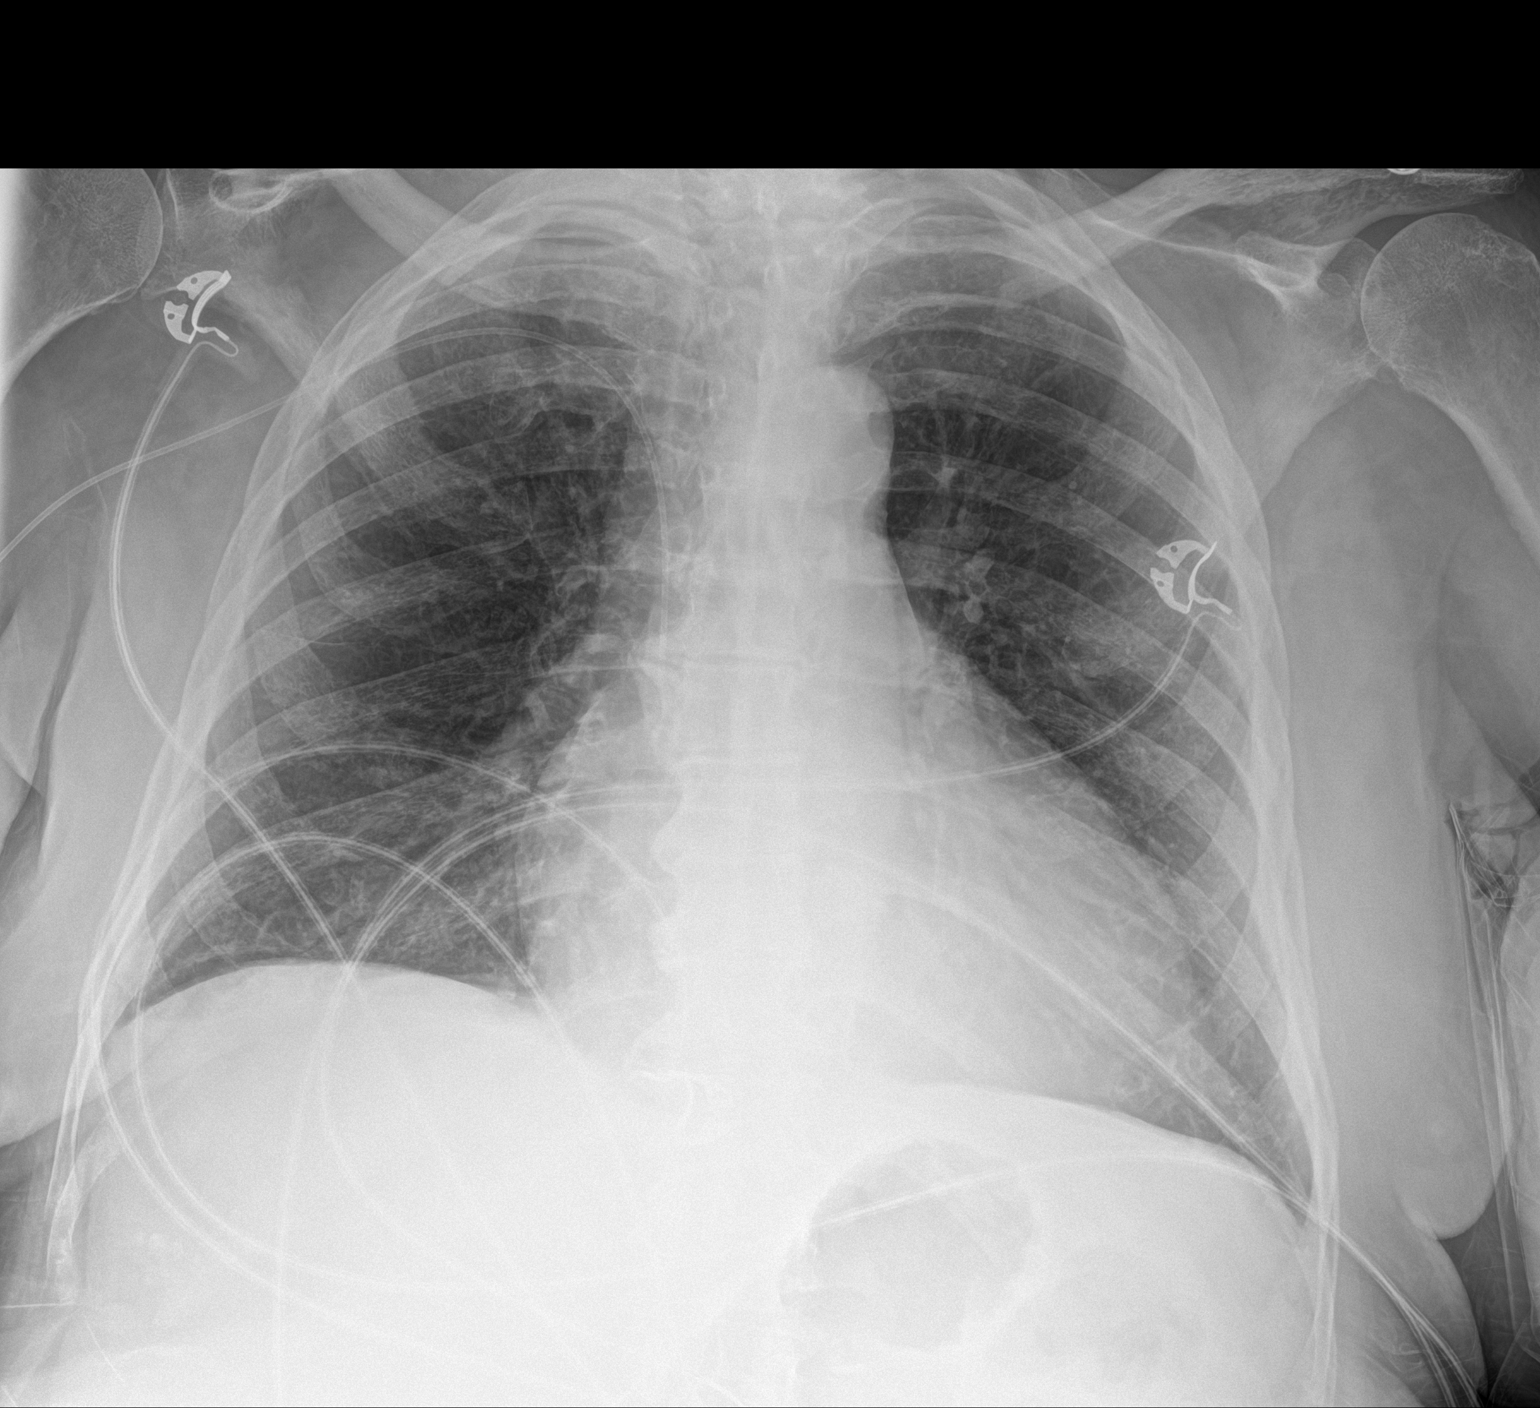

[1 of 1 positions shown; findings below may reference images not displayed]

FINDINGS: Right upper extremity PICC tip is in the mid-lower SVC. The previous
right internal jugular catheter is been removed. Interval extubation
and removal of enteric tube. Mild cardiomegaly persists. Improved
bilateral pleural effusions and airspace opacity. Minimal residual
blunting of costophrenic angles. Surgical hardware in the lower
cervical spine.
IMPRESSION: 1. Tip of the right upper extremity PICC in the mid-lower SVC.
2. Improved bilateral pleural effusions and airspace opacity.

## 2021-09-10 MED ORDER — LEVOTHYROXINE SODIUM 100 MCG PO TABS
100.0000 ug | ORAL_TABLET | Freq: Every day | ORAL | Status: DC
  Administered 2021-09-11 – 2021-09-17 (×7): 100 ug
  Filled 2021-09-10 (×7): qty 1

## 2021-09-10 MED ORDER — ENOXAPARIN SODIUM 40 MG/0.4ML IJ SOSY
40.0000 mg | PREFILLED_SYRINGE | INTRAMUSCULAR | Status: DC
  Administered 2021-09-10 – 2021-09-17 (×8): 40 mg via SUBCUTANEOUS
  Filled 2021-09-10 (×8): qty 0.4

## 2021-09-10 MED ORDER — PROPOFOL 1000 MG/100ML IV EMUL
INTRAVENOUS | Status: AC
  Filled 2021-09-10: qty 100

## 2021-09-10 NOTE — Progress Notes (Signed)
This RN responded to consult for dressing change to PICC. On assessment, the PICC line was out 2+cm. The insertion site is free from bleeding or drainage. The skin surrounding the site is irritated and appears to have localized allergic reaction symptoms. 3 minute areas of excoriation seeping serosanguinous fluid are noted above the insertion site. Patient denies pain or tenderness. Dressing was changed and line was secured in place with 2+cm remaining out. CXR ordered.

## 2021-09-10 NOTE — Progress Notes (Signed)
PROGRESS NOTE  LILEANA HUTSELL IBB:[id number] DOB: [birth year]   PCP: Darrow Bussing, MD  Patient is from: SNF.  DOA: 08/28/2021 LOS: 13  Chief complaints:  Chief Complaint  Patient presents with   Altered Mental Status     Brief Narrative / Interim history: 75 year old F with PMH of alcohol abuse, admitted on 06/10/2021 for encephalopathy, cervical spine discitis and osteomyelitis for which she underwent I&D with anterior cervical discectomy C5-C6, and treated with IV vancomycin and Rocephin, then cefazolin with rifampin, dysphagia and malnutrition requiring PEG tube on 07/11/2021 and urinary tension requiring Foley catheter.    Readmitted on 07/20/2021 with altered mental status on and found to have acute ischemic pontine infarct.  Patient was not on antiplatelet or anticoagulation due to thrombocytopenia and risk of bleeding.  Imaging also showed concern of discitis and osteomyelitis T12-L1 neurosurgery was reconsulted recommended no further surgical intervention.  ID was reconsulted on 07/24/2021 recommended to continue cefazolin.  Palliative care was consulted on 07/26/2021 and family wanted full scope of treatment. Patient was discharged back to [facility] on 08/06/2021 on IV Ancef through 08/25/2021.    Patient was sent back to ED on 08/28/2021 for shortness of breath and AMS.  She had a cardiac arrest while in ED requiring CPR and epinephrine with cardioversion, and admitted to ICU with respiratory failure requiring intubation and mechanical ventilation in the setting of possible aspiration pneumonia and encephalopathy.    Patient was extubated and transferred to [facility] service on 09/03/2021.  Main issue is recurrent hypoglycemia despite continuous tube feed.  A.m. cortisol within normal.  Thyroid panel suggests hypothyroidism with elevated TSH to 57 and low free T4 to 0.6.  Started on Synthroid 100 mcg per G-tube after discussion with pharmacy.    Subjective: Seen and examined  earlier this morning.  No major events overnight of this morning.  No complaints.  She denies pain, shortness of breath or GI symptoms.  She is oriented to self, place and situation but not time.  Objective: Vitals:   09/09/21 1613 09/09/21 2041 09/10/21 0805 09/10/21 1158  BP: 114/89 (!) 137/43 112/62 (!) 111/58  Pulse:   85 78  Resp: 17 16 20 18   Temp: 98.5 F (36.9 C) (!) 97.4 F (36.3 C) 98.2 F (36.8 C) 98 F (36.7 C)  TempSrc: Axillary Axillary Oral Oral  SpO2: 100% 100% 99% 100%  Weight:      Height:        Intake/Output Summary (Last 24 hours) at 09/10/2021 1302 Last data filed at 09/10/2021 0959 Gross per 24 hour  Intake 283.83 ml  Output 2550 ml  Net -2266.17 ml   Filed Weights   09/02/21 0450 09/04/21 0412 09/05/21 0816  Weight: 78 kg 75 kg 77.6 kg    Examination  GENERAL: No apparent distress.  Nontoxic. HEENT: MMM.  Vision and hearing grossly intact.  NECK: Supple.  No apparent JVD.  RESP: 100% on RA.  No IWOB.  Fair aeration bilaterally. CVS:  RRR. Heart sounds normal.  ABD/GI/GU: BS+. Abd soft, NTND.  On TF via G-tube.  Indwelling Foley catheter in place. MSK/EXT:  Moves extremities.  Generalized weakness.  Trace BLE edema. SKIN: Dry skin all over. NEURO: Awake and alert. Oriented appropriately.  No apparent focal neuro deficit but generalized weakness. PSYCH: Calm. Normal affect.   Procedures:  Intubation and extubation  Microbiology summarized: 08/27/2021 blood culture: Gram-positive cocci and Acinetobacter  Assessment & Plan: Acute respiratory failure with hypoxia possibly due to  aspiration pneumonia s/p intubation secondary to cardiac arrest: Resolved but remains high risk for reintubation -SLP advanced to pured diet on 11/1. -Continue aspiration precaution   Gram-positive cocci/Acinetobacter bacteremia and septic shock: sepsis physiology resolved.  Repeat cultures negative.  CT abdomen and pelvis did not show source of infection. -ID rec-IV  Unasyn in-house, and Bactrim on discharge to complete 14 days total from 10/19 through 11/2.   ATN due to sepsis: Resolved Recent Labs    08/30/21 0436 08/31/21 0327 09/01/21 0250 09/02/21 1416 09/03/21 0417 09/04/21 0410 09/05/21 0224 09/06/21 0200 09/08/21 0330 09/09/21 0409  BUN 36* 34* 26* 22 19 20  26* 33* 27* 24*  CREATININE 1.25* 1.07* 0.88 0.91 0.77 0.84 0.96 0.97 0.69 0.69  -Monitor intermittently  Mild hyponatremia: Likely due to insensible loss.  Resolved with free water. -Decreased free water to 100 cc every 6 hours on 10/31. -Recheck sodium  Acute on chronic anemia of chronic: H&H stable after 1 unit on 10/21.   Thrombocytopenia: Improved. Recent Labs    09/01/21 0250 09/01/21 0937 09/02/21 1416 09/03/21 0417 09/04/21 0410 09/05/21 0224 09/06/21 0200 09/07/21 0542 09/08/21 0329 09/09/21 0409  HGB 7.1* 7.1* 7.2* 8.2* 8.7* 8.0* 8.1* 8.3* 7.8* 7.8*  -Monitor intermittently   Acute metabolic encephalopathy: Initially concern for ABI after cardiac arrest.  Hypothyroidism could contribute.  Mental status improved.  She is oriented to self, place and situation but not time. -Treat treatable causes -Reorientation and delirium precautions. -Minimize or avoid sedating medications -Continue nightly Seroquel and mittens.  Chronic indwelling Foley catheter: Exchange on her recent admission. Urine cultures NGTD. -Continue indwelling Foley  Recurrent hypoglycemia: CBG low to 60 about 8 PM last night.  Previously on Solu-Cortef but a.m. cortisol does not suggest AI.  Hypothyroidism might contribute. Recent Labs  Lab 09/09/21 2126 09/10/21 0013 09/10/21 0437 09/10/21 0809 09/10/21 1157  GLUCAP 107* 84 93 106* 88  -Follow ACTH  -Appreciate help by dietitian-added supplemental feed -Started Synthroid for hypothyroidism on 10/31.   Acute CVA recent small pontine stroke: Neuro exam is somewhat nonfocal but weak globally. -Continue statin and aspirin  Profound  muscular deconditioning: Unclear etiology but multiple potentials including CVA, possible ABI after cardiac arrest and hypothyroidism -Continue physical therapy to continue to work with her. -Treat what can be treated.  Hypotension: Normotensive off Solu-Cortef on low-dose midodrine. -Continue midodrine 5 mg 3 times daily -Started Synthroid as well   Goal of care counseling/headaches -Appreciate input by palliative care -Remains full code with full scope of care  Hypothyroidism: has clinical features including dry skin, mental status change, hypoglycemia and profound weakness.  TSH elevated to 57.  Free T4 low at 0.6. -Started Synthroid 100 mcg per NG tube after discussion with pharmacy -Needs repeat TSH in 4 to 6 weeks  Pruritic skin rash-dry skin globally which could be due to hypothyroidism.   -Follow vitamin B1 and vitamin C levels -Thiamine and vitamin C supplementation -Try Benadryl instead of Atarax -Continue Eucerin cream -Synthroid for hypothyroidism  Hypomagnesemia/hypokalemia: Resolved.  Severe malnutrition/dysphagia: Advance to pured diet by SLP on 11/1. Body mass index is 27.61 kg/m. Nutrition Problem: Severe Malnutrition Etiology: acute illness (bacteremia) Signs/Symptoms: mild fat depletion, moderate muscle depletion, percent weight loss (11.7% weight loss in less than 2 months) Percent weight loss: 11.7 % Interventions: Tube feeding, Prostat  Pressure skin injury: POA Pressure Injury 08/28/21 Coccyx Right Stage 2 -  Partial thickness loss of dermis presenting as a shallow open injury with a red, pink wound bed  without slough. (Active)  08/28/21 1030  Location: Coccyx  Location Orientation: Right  Staging: Stage 2 -  Partial thickness loss of dermis presenting as a shallow open injury with a red, pink wound bed without slough.  Wound Description (Comments):   Present on Admission: Yes     Pressure Injury 08/28/21 Sacrum Medial Stage 2 -  Partial thickness  loss of dermis presenting as a shallow open injury with a red, pink wound bed without slough. (Active)  08/28/21 1030  Location: Sacrum  Location Orientation: Medial  Staging: Stage 2 -  Partial thickness loss of dermis presenting as a shallow open injury with a red, pink wound bed without slough.  Wound Description (Comments):   Present on Admission:      Pressure Injury 08/28/21 Buttocks Right Stage 2 -  Partial thickness loss of dermis presenting as a shallow open injury with a red, pink wound bed without slough. (Active)  08/28/21 1030  Location: Buttocks  Location Orientation: Right  Staging: Stage 2 -  Partial thickness loss of dermis presenting as a shallow open injury with a red, pink wound bed without slough.  Wound Description (Comments):   Present on Admission:    DVT prophylaxis:  enoxaparin (LOVENOX) injection 40 mg Start: 09/10/21 1400 Place and maintain sequential compression device Start: 08/31/21 1102 SCDs Start: 08/28/21 [id number]  Code Status: Full code Family Communication: Updated patient's daughter over the phone Level of care: Progressive Status is: Inpatient  Remains inpatient appropriate because: recurrent hypoglycemia requiring dextrose boluses despite continuous tube feed.  Likely home in the next 24 to 48 hours if she remains euglycemic and DME delivered.    Consultants:  Palliative medicine Pulmonology Infectious disease   Sch Meds:  Scheduled Meds:  vitamin C  500 mg Per Tube Daily   aspirin  81 mg Per Tube Daily   atorvastatin  40 mg Per Tube q1800   chlorhexidine  15 mL Mouth Rinse BID   Chlorhexidine Gluconate Cloth  6 each Topical Daily   enoxaparin (LOVENOX) injection  40 mg Subcutaneous Q24H   feeding supplement (PROSource TF)  45 mL Per Tube Daily   free water  100 mL Per Tube Q6H   hydrocerin   Topical BID   [START ON 09/11/2021] levothyroxine  100 mcg Per Tube Q0600   mouth rinse  15 mL Mouth Rinse q12n4p   midodrine  5 mg Per Tube TID  WC   nutrition supplement (JUVEN)  1 packet Per Tube BID BM   QUEtiapine  25 mg Per Tube QHS   sodium chloride flush  10-40 mL Intracatheter Q12H   thiamine injection  100 mg Intravenous Daily   Continuous Infusions:  ampicillin-sulbactam (UNASYN) IV 3 g (09/10/21 1036)   feeding supplement (JEVITY 1.5 CAL/FIBER) 1,500 mL (09/10/21 1058)   PRN Meds:.acetaminophen, diphenhydrAMINE, docusate, influenza vaccine adjuvanted, melatonin, pneumococcal 23 valent vaccine, polyethylene glycol, sodium chloride flush  Antimicrobials: Anti-infectives (From admission, onward)    Start     Dose/Rate Route Frequency Ordered Stop   09/05/21 1415  fluconazole (DIFLUCAN) tablet 150 mg  Status:  Discontinued        150 mg Per Tube Daily 09/05/21 1320 09/05/21 1320   09/05/21 1415  fluconazole (DIFLUCAN) 40 MG/ML suspension 200 mg        200 mg Per Tube  Once 09/05/21 1320 09/05/21 1524   08/29/21 1200  vancomycin (VANCOREADY) IVPB 1250 mg/250 mL  Status:  Discontinued        1,250  mg 166.7 mL/hr over 90 Minutes Intravenous Every 24 hours 08/28/21 1113 08/29/21 0726   08/29/21 1200  vancomycin (VANCOCIN) IVPB 1000 mg/200 mL premix        1,000 mg 200 mL/hr over 60 Minutes Intravenous Every 24 hours 08/29/21 0726 09/01/21 1413   08/28/21 2300  Ampicillin-Sulbactam (UNASYN) 3 g in sodium chloride 0.9 % 100 mL IVPB        3 g 200 mL/hr over 30 Minutes Intravenous Every 6 hours 08/28/21 2200 09/12/21 2359   08/28/21 1800  ceFEPIme (MAXIPIME) 2 g in sodium chloride 0.9 % 100 mL IVPB  Status:  Discontinued        2 g 200 mL/hr over 30 Minutes Intravenous Every 12 hours 08/28/21 1113 08/28/21 2200   08/28/21 1200  vancomycin (VANCOREADY) IVPB 1750 mg/350 mL        1,750 mg 175 mL/hr over 120 Minutes Intravenous  Once 08/28/21 1113 08/28/21 1348   08/28/21 0645  vancomycin (VANCOREADY) IVPB 1750 mg/350 mL  Status:  Discontinued        1,750 mg 175 mL/hr over 120 Minutes Intravenous  Once 08/28/21 0630  08/28/21 1139   08/28/21 0615  ceFEPIme (MAXIPIME) 2 g in sodium chloride 0.9 % 100 mL IVPB        2 g 200 mL/hr over 30 Minutes Intravenous  Once 08/28/21 0608 08/28/21 2045        I have personally reviewed the following labs and images: CBC: Recent Labs  Lab 09/05/21 0224 09/06/21 0200 09/07/21 0542 09/08/21 0329 09/09/21 0409  WBC 10.0 8.7 9.0 9.0 8.0  HGB 8.0* 8.1* 8.3* 7.8* 7.8*  HCT 26.4* 27.3* 26.9* 25.9* 25.4*  MCV 103.1* 106.2* 105.1* 105.7* 103.7*  PLT 116* 138* 143* 146* 149*   BMP &GFR Recent Labs  Lab 09/04/21 0410 09/05/21 0224 09/06/21 0200 09/07/21 0542 09/07/21 1408 09/08/21 0329 09/08/21 0330 09/09/21 0409  NA 142 142 146*  --  141  --  141 137  K 3.7 3.4* 4.1  --   --   --  3.6 3.7  CL 104 108 113*  --   --   --  106 103  CO2 29 29 27   --   --   --  28 28  GLUCOSE 115* 125* 99  --   --   --  97 106*  BUN 20 26* 33*  --   --   --  27* 24*  CREATININE 0.84 0.96 0.97  --   --   --  0.69 0.69  CALCIUM 9.0 8.7* 8.6*  --   --   --  8.7* 8.6*  MG 1.3* 2.2 2.2 1.7  --  1.5*  --  1.9  PHOS 2.9 4.1 4.2  --   --   --  3.5 3.6   Estimated Creatinine Clearance: 63.9 mL/min (by C-G formula based on SCr of 0.69 mg/dL). Liver & Pancreas: Recent Labs  Lab 09/05/21 0224 09/06/21 0200 09/08/21 0330 09/09/21 0409  ALBUMIN 2.2* 2.2* 2.1* 2.1*   No results for input(s): LIPASE, AMYLASE in the last 168 hours. No results for input(s): AMMONIA in the last 168 hours. Diabetic: No results for input(s): HGBA1C in the last 72 hours. Recent Labs  Lab 09/09/21 2126 09/10/21 0013 09/10/21 0437 09/10/21 0809 09/10/21 1157  GLUCAP 107* 84 93 106* 88   Cardiac Enzymes: Recent Labs  Lab 09/08/21 0329 09/09/21 0409  CKTOTAL 30* 29*   No results for input(s): PROBNP in the  last 8760 hours. Coagulation Profile: No results for input(s): INR, PROTIME in the last 168 hours.  Thyroid Function Tests: Recent Labs    09/09/21 0409  TSH 57.084*  FREET4 0.60*    Lipid Profile: No results for input(s): CHOL, HDL, LDLCALC, TRIG, CHOLHDL, LDLDIRECT in the last 72 hours. Anemia Panel: Recent Labs    09/08/21 0329 09/08/21 0500  VITAMINB12 942*  --   FOLATE  --  12.0  FERRITIN 188  --   TIBC 242*  --   IRON 47  --   RETICCTPCT  --  3.8*    Urine analysis:    Component Value Date/Time   COLORURINE YELLOW 08/28/2021 1205   APPEARANCEUR CLEAR 08/28/2021 1205   LABSPEC 1.020 08/28/2021 1205   PHURINE 5.5 08/28/2021 1205   GLUCOSEU NEGATIVE 08/28/2021 1205   HGBUR SMALL (A) 08/28/2021 1205   BILIRUBINUR NEGATIVE 08/28/2021 1205   KETONESUR NEGATIVE 08/28/2021 1205   PROTEINUR NEGATIVE 08/28/2021 1205   UROBILINOGEN 0.2 05/14/2008 0020   NITRITE NEGATIVE 08/28/2021 1205   LEUKOCYTESUR SMALL (A) 08/28/2021 1205   Sepsis Labs: Invalid input(s): PROCALCITONIN, LACTICIDVEN  Microbiology: No results found for this or any previous visit (from the past 240 hour(s)).   Radiology Studies: No results found.    Mylee Falin T. Destyn Schuyler [facility]  If 7PM-7AM, please contact night-coverage [email or web address] 09/10/2021, 1:02 PM

## 2021-09-10 NOTE — Progress Notes (Signed)
Speech Language Pathology Treatment: Dysphagia  Patient Details Name: Kristy [name] MRN: [id number] DOB: Aug 06, 1946 Today's Date: 09/10/2021 Time: 1191-4782 SLP Time Calculation (min) (ACUTE ONLY): 13 min  Assessment / Plan / Recommendation Clinical Impression  Pt was seen at bedside for skilled ST intervention targeting goals for PO tolerance and adherence to safe swallow precautions. Pt was awake and alert today, and was willing to accept 2 boluses each of puree and thin liquid. No overt s/s aspiration observed following either texture, however, after these 4 boluses pt declined additional presentations. Pt appears safe to advance to pureed solids and thin liquids at this time. Will continue to follow pt to assess diet tolerance and determine readiness to advance textures.   HPI HPI: Patient is a 75 y.o. female with PMH: ETOH abuse, prior admission on 06/10/2021 for encephalopathy, cervical spine discitis and osteomyelitis for which she underwent I&D with ACDF C5-C6, dysphagia and malnutrition requiring PEG tube on 07/11/2021 and urinary tension requiring Foley catheter. 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 She was discharged 9/27 to SNF.   Patient presented to ED 10/19 for SOB and AMS. She had a cardiac arrest while in ED requiring CPR. She was admitted to ICU with respiratory failure requiring intubation and mechanical ventilation in setting of possible aspiration PNA and encephalopathy. She was extubated on 10/25. MBS during previous hospital stay on 8/15 revealed moderately impaired dysphagia from both mechanical and cognitive components. Patient was upgraded to dys 3 solids and thin liquids however PEG was placed due to poor intake.      SLP Plan  Continue with current plan of care      Recommendations for follow up therapy are one component of a multi-disciplinary discharge planning process, led by the attending physician.  Recommendations may be updated  based on patient status, additional functional criteria and insurance authorization.    Recommendations  Diet recommendations: Thin liquid;Dysphagia 1 (puree) Liquids provided via: Cup;Straw Medication Administration: Via alternative means Supervision: Trained caregiver to feed patient Compensations: Slow rate;Small sips/bites Postural Changes and/or Swallow Maneuvers: Seated upright 90 degrees                Oral Care Recommendations: Oral care BID;Staff/trained caregiver to provide oral care Follow up Recommendations: 24 hour supervision/assistance;Skilled Nursing facility SLP Visit Diagnosis: Dysphagia, unspecified (R13.10) Plan: Continue with current plan of care       GO              Kristy [name], [facility], CCC-SLP Speech Language Pathologist Office: [phone]  Kristy [name]  09/10/2021, 9:57 AM

## 2021-09-10 NOTE — Plan of Care (Signed)

## 2021-09-11 LAB — RENAL FUNCTION PANEL
Albumin: 2.4 g/dL — ABNORMAL LOW (ref 3.5–5.0)
Anion gap: 8 (ref 5–15)
BUN: 40 mg/dL — ABNORMAL HIGH (ref 8–23)
CO2: 25 mmol/L (ref 22–32)
Calcium: 8.9 mg/dL (ref 8.9–10.3)
Chloride: 108 mmol/L (ref 98–111)
Creatinine, Ser: 0.72 mg/dL (ref 0.44–1.00)
GFR, Estimated: >60 mL/min (ref >60)
Glucose, Bld: 87 mg/dL (ref 70–99)
Phosphorus: 3.5 mg/dL (ref 2.5–4.6)
Potassium: 3.9 mmol/L (ref 3.5–5.1)
Sodium: 141 mmol/L (ref 135–145)

## 2021-09-11 LAB — CBC
HCT: 24.7 % — ABNORMAL LOW (ref 36.0–46.0)
Hemoglobin: 7.8 g/dL — ABNORMAL LOW (ref 12.0–15.0)
MCH: 32.5 pg (ref 26.0–34.0)
MCHC: 31.6 g/dL (ref 30.0–36.0)
MCV: 102.9 fL — ABNORMAL HIGH (ref 80.0–100.0)
Platelets: 158 10*3/uL (ref 150–400)
RBC: 2.4 MIL/uL — ABNORMAL LOW (ref 3.87–5.11)
RDW: 18.3 % — ABNORMAL HIGH (ref 11.5–15.5)
WBC: 6.2 10*3/uL (ref 4.0–10.5)
nRBC: 0 % (ref 0.0–0.2)

## 2021-09-11 LAB — GLUCOSE, CAPILLARY
Glucose-Capillary: 116 mg/dL — ABNORMAL HIGH (ref 70–99)
Glucose-Capillary: 82 mg/dL (ref 70–99)
Glucose-Capillary: 85 mg/dL (ref 70–99)
Glucose-Capillary: 89 mg/dL (ref 70–99)
Glucose-Capillary: 91 mg/dL (ref 70–99)
Glucose-Capillary: 97 mg/dL (ref 70–99)

## 2021-09-11 LAB — MAGNESIUM: Magnesium: 1.6 mg/dL — ABNORMAL LOW (ref 1.7–2.4)

## 2021-09-11 LAB — VITAMIN C: Vitamin C: 2.4 mg/dL — ABNORMAL HIGH (ref 0.4–2.0)

## 2021-09-11 NOTE — TOC Progression Note (Signed)
Transition of Care [facility]) - Progression Note    Patient Details  Name: Kristy [name] MRN: [id number] Date of Birth: November 24, 1945  [facility] CM/SW Contact  Lorri Frederick, LCSW Phone Number: 09/11/2021, 2:42 PM  Clinical Narrative:   CSW spoke with Belenda Cruise at [facility] supportive [facility].  They have made contact with daughter and will initiate services for palliative at DC.     Expected Discharge Plan:  (TBD-Home with palliative care potentially) Barriers to Discharge: Other (must enter comment) (family making arrangements for long term care at home)  Expected Discharge Plan and Services Expected Discharge Plan:  (TBD-Home with palliative care potentially)   Discharge Planning Services: CM Consult   Living arrangements for the past 2 months: Apartment                 DME Arranged: 3-N-1, Wheelchair manual, Hospital bed, Other see comment (hoyer lift)                     Social Determinants of Health (SDOH) Interventions    Readmission Risk Interventions No flowsheet data found.

## 2021-09-11 NOTE — Progress Notes (Signed)
PROGRESS NOTE    Kristy [name]  ZDG:[id number] DOB: 05/10/1946 DOA: 08/28/2021 PCP: Darrow Bussing, MD   Chief Complaint  Patient presents with   Altered Mental Status   Brief Narrative/Hospital Course:  Kristy [name], 75 y.o. female with PMH of alcohol abuse, admitted on 06/10/2021 for encephalopathy, cervical spine discitis and osteomyelitis for which she underwent I&D with anterior cervical discectomy C5-C6, and treated with IV vancomycin and Rocephin, then cefazolin with rifampin, dysphagia and malnutrition requiring PEG tube on 07/11/2021 and urinary tension requiring Foley catheter.     Readmitted on 07/20/2021 with altered mental status on and found to have acute ischemic pontine infarct.  Patient was not on antiplatelet or anticoagulation due to thrombocytopenia and risk of bleeding.  Imaging also showed concern of discitis and osteomyelitis T12-L1 neurosurgery was reconsulted recommended no further surgical intervention.  ID was reconsulted on 07/24/2021 recommended to continue cefazolin.  Palliative care was consulted on 07/26/2021 and family wanted full scope of treatment. Patient was discharged back to [facility] on 08/06/2021 on IV Ancef through 08/25/2021.     Patient was sent back to ED on 08/28/2021 for shortness of breath and AMS.  She had a cardiac arrest while in ED requiring CPR and epinephrine with cardioversion, and admitted to ICU with respiratory failure requiring intubation and mechanical ventilation in the setting of possible aspiration pneumonia and encephalopathy.     Patient was extubated and transferred to [facility] on 09/03/2021.  Main issue is recurrent hypoglycemia despite continuous tube feed.  A.m. cortisol within normal.  Thyroid panel suggests hypothyroidism with elevated TSH to 57 and low free T4 to 0.6.  Started on Synthroid 100 mcg per G-tube after discussion with pharmacy.   Subjective:  Seen this am No hypoglycemia overnight blood sugar  85>91 She is alert, awake oriented to self her date of birth she thinks it is 2002 and she is not aware about situation or location.  She is able to follow commands move her extremities.  Assessment & Plan:  Acute hypoxic respiratory failure Aspiration pneumonia Cardiac arrest likely due to aspiration needing intubation:  Patient is alert awake follows commands.  But pleasantly confused.Currently on room air.  Speech following for safety of oral intake, on tube feeding.  Continue supportive care.  Fall precaution delirium precaution.  Staph HemOlyticus/Acinetobacter Calcoaceticus/Baumanii complex with septic shock POA: Hemodynamically stable CT abdomen pelvis no source of infection, repeat cultures are negative as per ID-Unasyn while inpatient then Bactrim on discharge to complete 14 days total therapy in 11/2.  Acute metabolic encephalopathy Recurrent encephalopathy suspect now has chronic component: Initially concern about TBI following cardiac arrest, but mentation improved.  She is oriented to self place situation but not time. Continue supportive care delirium precaution reorientation minimize psychotropic medications reduce  Recent acute CVA:on aspirin and statin  ATN due to sepsis resolved Recent Labs  Lab 09/05/21 0224 09/06/21 0200 09/08/21 0330 09/09/21 0409 09/11/21 0341  BUN 26* 33* 27* 24* 40*  CREATININE 0.96 0.97 0.69 0.69 0.72    Mild hyponatremia likely insensible loss resolved with free water Recent Labs  Lab 09/06/21 0200 09/07/21 1408 09/08/21 0330 09/09/21 0409 09/11/21 0341  NA 146* 141 141 137 141   Acute on chronic anemia Anemia coronary disease: 1 unit PRBC transfused 10/21.H&H 7-8 gm, transfusion threshold less than 7 g. Recent Labs  Lab 09/06/21 0200 09/07/21 0542 09/08/21 0329 09/09/21 0409 09/11/21 0341  HGB 8.1* 8.3* 7.8* 7.8* 7.8*  HCT 27.3*  26.9* 25.9* 25.4* 24.7*    Thrombocytopenia_monitor, stable Recent Labs  Lab  09/06/21 0200 09/07/21 0542 09/08/21 0329 09/09/21 0409 09/11/21 0341  PLT 138* 143* 146* 149* 158    Chronic indwelling Foley catheter due to recurrent retention: Foley was changed on the recent admission, urine cultures no growth.  Continue routine care  Recurrent hypoglycemia, patient cortisol was low previously on Solu-Cortef, placed on Synthroid for hypothyroidism as it could be contributory, continue to feed and monitor Recent Labs  Lab 09/10/21 1157 09/10/21 1603 09/10/21 2033 09/11/21 0018 09/11/21 0440  GLUCAP 88 80 65* 85 91   Profound physical deconditioning Debility: Continue PT OT as tolerated.  She will need placement again.  Hypotension: Off Solu-Cortef.  On midodrine  Hypothyroidism started on Synthroid 100 MCG TSH was 57 Free T4 0.6, follow-up TSH in 4 to 6 weeks to adjust dose  Chronic skin dermatitis/pruritic rash: Continue thiamine vitamin C Benadryl for itching, Eucerin.  Continue hypothyroidism treatment  Hypomagnesemia/hypokalemia: Monitor and replete  Goals of care/recurrent admissions: Prognosis not bright in this unfortunate female who has had multiple hospitalizations and continues to do poorly.  Palliative care had seen the patient multiple times.  At this time family requesting full aggressive care and full code  Severe protein-calorie malnutrition in the setting of chronic illness of malnutrition  pressure injury of skin of coccyx right, medial sacrum and right buttock stage II, present on admission are normal.  See below- Pressure Injury 08/28/21 Coccyx Right Stage 2 -  Partial thickness loss of dermis presenting as a shallow open injury with a red, pink wound bed without slough. (Active)  08/28/21 1030  Location: Coccyx  Location Orientation: Right  Staging: Stage 2 -  Partial thickness loss of dermis presenting as a shallow open injury with a red, pink wound bed without slough.  Wound Description (Comments):   Present on Admission: Yes      Pressure Injury 08/28/21 Sacrum Medial Stage 2 -  Partial thickness loss of dermis presenting as a shallow open injury with a red, pink wound bed without slough. (Active)  08/28/21 1030  Location: Sacrum  Location Orientation: Medial  Staging: Stage 2 -  Partial thickness loss of dermis presenting as a shallow open injury with a red, pink wound bed without slough.  Wound Description (Comments):   Present on Admission:YES     Pressure Injury 08/28/21 Buttocks Right Stage 2 -  Partial thickness loss of dermis presenting as a shallow open injury with a red, pink wound bed without slough. (Active)  08/28/21 1030  Location: Buttocks  Location Orientation: Right  Staging: Stage 2 -  Partial thickness loss of dermis presenting as a shallow open injury with a red, pink wound bed without slough.  Wound Description (Comments):   Present on Admission: YES   DVT prophylaxis: enoxaparin (LOVENOX) injection 40 mg Start: 09/10/21 1400 Place and maintain sequential compression device Start: 08/31/21 1102 SCDs Start: 08/28/21 0819 Code Status:   Code Status: Full Code Family Communication: plan of care discussed with patient AND rn at bedside.  No family at bedside Status is: Inpatient Remains inpatient appropriate because: For ongoing management of multiple issues, unsafe disposition will need placement  Objective: Vitals last 24 hrs: Vitals:   09/10/21 1613 09/10/21 2035 09/11/21 0000 09/11/21 0400  BP: 127/61 126/61  (!) 122/95  Pulse: 84   99  Resp: 20   19  Temp: 98 F (36.7 C) (!) 97.3 F (36.3 C) 97.7 F (36.5 C) 98.7  F (37.1 C)  TempSrc: Oral Oral Oral Oral  SpO2: 100%   100%  Weight:      Height:       Weight change:   Intake/Output Summary (Last 24 hours) at 09/11/2021 0853 Last data filed at 09/11/2021 0535 Gross per 24 hour  Intake 2747.18 ml  Output 3350 ml  Net -602.82 ml   Net IO Since Admission: -8,992.7 mL [09/11/21 0853]   Physical Examination:  General exam:  Aa0x1-2, able to follow commands HEENT:Oral mucosa moist, Ear/Nose WNL grossly,dentition normal. Respiratory system: B/l diminished , no use of accessory muscle, non tender. Cardiovascular system: S1 & S2 +,No JVD. Gastrointestinal system: Abdomen soft, NT,ND, BS+. PEG+ Nervous System:She is alert, awake oriented to self her date of birth she thinks it is 2002 and she is not aware about situation or location.  She is able to follow commands move her extremities. Extremities: edema chronic appearing with hyperkeratosis hyperpigmentation amlodipine. Skin: chronic LE skin changes [id number] muscle bulk, tone, power.  Medications reviewed:  Scheduled Meds:  vitamin C  500 mg Per Tube Daily   aspirin  81 mg Per Tube Daily   atorvastatin  40 mg Per Tube q1800   chlorhexidine  15 mL Mouth Rinse BID   Chlorhexidine Gluconate Cloth  6 each Topical Daily   enoxaparin (LOVENOX) injection  40 mg Subcutaneous Q24H   feeding supplement (PROSource TF)  45 mL Per Tube Daily   free water  100 mL Per Tube Q6H   hydrocerin   Topical BID   levothyroxine  100 mcg Per Tube Q0600   mouth rinse  15 mL Mouth Rinse q12n4p   midodrine  5 mg Per Tube TID WC   nutrition supplement (JUVEN)  1 packet Per Tube BID BM   QUEtiapine  25 mg Per Tube QHS   sodium chloride flush  10-40 mL Intracatheter Q12H   thiamine injection  100 mg Intravenous Daily   Continuous Infusions:  ampicillin-sulbactam (UNASYN) IV Stopped (09/11/21 0535)   feeding supplement (JEVITY 1.5 CAL/FIBER) Stopped (09/10/21 1100)   Diet Order             DIET - DYS 1 Room service appropriate? No; Fluid consistency: Thin  Diet effective now                   Nutrition Problem: Severe Malnutrition Etiology: acute illness (bacteremia) Signs/Symptoms: mild fat depletion, moderate muscle depletion, percent weight loss (11.7% weight loss in less than 2 months) Percent weight loss: 11.7 % Interventions: Tube feeding, Prostat  Weight change:    Wt Readings from Last 3 Encounters:  09/05/21 77.6 kg  07/19/21 84.1 kg  07/16/21 84.1 kg     Consultants:see note  Procedures:see note Antimicrobials: Anti-infectives (From admission, onward)    Start     Dose/Rate Route Frequency Ordered Stop   09/05/21 1415  fluconazole (DIFLUCAN) tablet 150 mg  Status:  Discontinued        150 mg Per Tube Daily 09/05/21 1320 09/05/21 1320   09/05/21 1415  fluconazole (DIFLUCAN) 40 MG/ML suspension 200 mg        200 mg Per Tube  Once 09/05/21 1320 09/05/21 1524   08/29/21 1200  vancomycin (VANCOREADY) IVPB 1250 mg/250 mL  Status:  Discontinued        1,250 mg 166.7 mL/hr over 90 Minutes Intravenous Every 24 hours 08/28/21 1113 08/29/21 0726   08/29/21 1200  vancomycin (VANCOCIN) IVPB 1000 mg/200 mL premix  1,000 mg 200 mL/hr over 60 Minutes Intravenous Every 24 hours 08/29/21 0726 09/01/21 1413   08/28/21 2300  Ampicillin-Sulbactam (UNASYN) 3 g in sodium chloride 0.9 % 100 mL IVPB        3 g 200 mL/hr over 30 Minutes Intravenous Every 6 hours 08/28/21 2200 09/12/21 2359   08/28/21 1800  ceFEPIme (MAXIPIME) 2 g in sodium chloride 0.9 % 100 mL IVPB  Status:  Discontinued        2 g 200 mL/hr over 30 Minutes Intravenous Every 12 hours 08/28/21 1113 08/28/21 2200   08/28/21 1200  vancomycin (VANCOREADY) IVPB 1750 mg/350 mL        1,750 mg 175 mL/hr over 120 Minutes Intravenous  Once 08/28/21 1113 08/28/21 1348   08/28/21 0645  vancomycin (VANCOREADY) IVPB 1750 mg/350 mL  Status:  Discontinued        1,750 mg 175 mL/hr over 120 Minutes Intravenous  Once 08/28/21 0630 08/28/21 1139   08/28/21 0615  ceFEPIme (MAXIPIME) 2 g in sodium chloride 0.9 % 100 mL IVPB        2 g 200 mL/hr over 30 Minutes Intravenous  Once 08/28/21 0608 08/28/21 2045      Culture/Microbiology    Component Value Date/Time   SDES BLOOD LEFT HAND 08/29/2021 1220   SPECREQUEST  08/29/2021 1220    BOTTLES DRAWN AEROBIC AND ANAEROBIC Blood Culture results may not be  optimal due to an inadequate volume of blood received in culture bottles   CULT  08/29/2021 1220    NO GROWTH 5 DAYS Performed at [facility], [place] [address], [place], [place] [address]    REPTSTATUS 09/03/2021 FINAL 08/29/2021 1220    Other culture-see note  Unresulted Labs (From admission, onward)     Start     Ordered   09/17/21 0500  Creatinine, serum  (enoxaparin (LOVENOX)    CrCl >/= 30 ml/min)  Weekly,   R     Comments: while on enoxaparin therapy   Question:  Specimen collection method  Answer:  Unit=Unit collect   09/10/21 1302   09/11/21 0840  Glucose, capillary  Once,   R        09/11/21 0840   09/06/21 0500  Vitamin C  Tomorrow morning,   R       Question:  Specimen collection method  Answer:  Unit=Unit collect   09/05/21 1311          Data Reviewed: I have personally reviewed following labs and imaging studies CBC: Recent Labs  Lab 09/06/21 0200 09/07/21 0542 09/08/21 0329 09/09/21 0409 09/11/21 0341  WBC 8.7 9.0 9.0 8.0 6.2  HGB 8.1* 8.3* 7.8* 7.8* 7.8*  HCT 27.3* 26.9* 25.9* 25.4* 24.7*  MCV 106.2* 105.1* 105.7* 103.7* 102.9*  PLT 138* 143* 146* 149* 0000000   Basic Metabolic Panel: Recent Labs  Lab 09/05/21 0224 09/06/21 0200 09/07/21 0542 09/07/21 1408 09/08/21 0329 09/08/21 0330 09/09/21 0409 09/11/21 0341  NA 142 146*  --  141  --  141 137 141  K 3.4* 4.1  --   --   --  3.6 3.7 3.9  CL 108 113*  --   --   --  106 103 108  CO2 29 27  --   --   --  28 28 25   GLUCOSE 125* 99  --   --   --  97 106* 87  BUN 26* 33*  --   --   --  27*  24* 40*  CREATININE 0.96 0.97  --   --   --  0.69 0.69 0.72  CALCIUM 8.7* 8.6*  --   --   --  8.7* 8.6* 8.9  MG 2.2 2.2 1.7  --  1.5*  --  1.9 1.6*  PHOS 4.1 4.2  --   --   --  3.5 3.6 3.5   GFR: Estimated Creatinine Clearance: 63.9 mL/min (by C-G formula based on SCr of 0.72 mg/dL). Liver Function Tests: Recent Labs  Lab 09/05/21 0224 09/06/21 0200 09/08/21 0330 09/09/21 0409 09/11/21 0341   ALBUMIN 2.2* 2.2* 2.1* 2.1* 2.4*   No results for input(s): LIPASE, AMYLASE in the last 168 hours. No results for input(s): AMMONIA in the last 168 hours. Coagulation Profile: No results for input(s): INR, PROTIME in the last 168 hours. Cardiac Enzymes: Recent Labs  Lab 09/08/21 0329 09/09/21 0409  CKTOTAL 30* 29*   BNP (last 3 results) No results for input(s): PROBNP in the last 8760 hours. HbA1C: No results for input(s): HGBA1C in the last 72 hours. CBG: Recent Labs  Lab 09/10/21 1157 09/10/21 1603 09/10/21 2033 09/11/21 0018 09/11/21 0440  GLUCAP 88 80 65* 85 91   Lipid Profile: No results for input(s): CHOL, HDL, LDLCALC, TRIG, CHOLHDL, LDLDIRECT in the last 72 hours. Thyroid Function Tests: Recent Labs    09/09/21 0409  TSH 57.084*  FREET4 0.60*   Anemia Panel: No results for input(s): VITAMINB12, FOLATE, FERRITIN, TIBC, IRON, RETICCTPCT in the last 72 hours. Sepsis Labs: No results for input(s): PROCALCITON, LATICACIDVEN in the last 168 hours.  No results found for this or any previous visit (from the past 240 hour(s)).   Radiology Studies: DG CHEST PORT 1 VIEW  Result Date: 09/10/2021 CLINICAL DATA:  PICC placement. EXAM: PORTABLE CHEST 1 VIEW COMPARISON:  Chest radiograph 08/28/2021 FINDINGS: Right upper extremity PICC tip is in the mid-lower SVC. The previous right internal jugular catheter is been removed. Interval extubation and removal of enteric tube. Mild cardiomegaly persists. Improved bilateral pleural effusions and airspace opacity. Minimal residual blunting of costophrenic angles. Surgical hardware in the lower cervical spine. IMPRESSION: 1. Tip of the right upper extremity PICC in the mid-lower SVC. 2. Improved bilateral pleural effusions and airspace opacity. Electronically Signed   By: Keith Rake M.D.   On: 09/10/2021 22:53     LOS: 14 days   Antonieta Pert, MD [facility]  09/11/2021, 8:53 AM
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
## 2021-09-11 NOTE — TOC Progression Note (Signed)
Transition of Care [facility]) - Progression Note    Patient Details  Name: Kristy [name] MRN: [id number] Date of Birth: 05/14/1946  Transition of [facility]) CM/SW Contact  Lorri Frederick, LCSW Phone Number: 09/11/2021, 2:43 PM  Clinical Narrative:   CSW spoke with daughter [name].  All DME is in the home, she is ready for pt to return.  Spoke to MD, will not DC today, one more day, daughter informed.  Cory at [facility] accepts for [facility].      Expected Discharge Plan:  (TBD-Home with palliative care potentially) Barriers to Discharge: Other (must enter comment) (family making arrangements for long term care at home)  Expected Discharge Plan and Services Expected Discharge Plan:  (TBD-Home with palliative care potentially)   Discharge Planning Services: CM Consult   Living arrangements for the past 2 months: Apartment                 DME Arranged: 3-N-1, Wheelchair manual, Hospital bed, Other see comment (hoyer lift)         HH Arranged: PT, OT, Nurse's Aide, Speech Therapy, RN   Date HH Agency Contacted: 09/11/21 Time HH Agency Contacted: [phone] Representative spoke with at [facility] Agency: Kandee Keen   Social Determinants of Health (SDOH) Interventions    Readmission Risk Interventions No flowsheet data found.

## 2021-09-12 DIAGNOSIS — I429 Cardiomyopathy, unspecified: Secondary | ICD-10-CM

## 2021-09-12 LAB — CBC
HCT: 26.2 % — ABNORMAL LOW (ref 36.0–46.0)
Hemoglobin: 7.8 g/dL — ABNORMAL LOW (ref 12.0–15.0)
MCH: 31.5 pg (ref 26.0–34.0)
MCHC: 29.8 g/dL — ABNORMAL LOW (ref 30.0–36.0)
MCV: 105.6 fL — ABNORMAL HIGH (ref 80.0–100.0)
Platelets: 190 10*3/uL (ref 150–400)
RBC: 2.48 MIL/uL — ABNORMAL LOW (ref 3.87–5.11)
RDW: 18.6 % — ABNORMAL HIGH (ref 11.5–15.5)
WBC: 7.9 10*3/uL (ref 4.0–10.5)
nRBC: 0 % (ref 0.0–0.2)

## 2021-09-12 LAB — GLUCOSE, CAPILLARY
Glucose-Capillary: 76 mg/dL (ref 70–99)
Glucose-Capillary: 79 mg/dL (ref 70–99)
Glucose-Capillary: 83 mg/dL (ref 70–99)
Glucose-Capillary: 86 mg/dL (ref 70–99)
Glucose-Capillary: 87 mg/dL (ref 70–99)
Glucose-Capillary: 87 mg/dL (ref 70–99)
Glucose-Capillary: 93 mg/dL (ref 70–99)

## 2021-09-12 LAB — BASIC METABOLIC PANEL
Anion gap: 8 (ref 5–15)
BUN: 52 mg/dL — ABNORMAL HIGH (ref 8–23)
CO2: 25 mmol/L (ref 22–32)
Calcium: 9.2 mg/dL (ref 8.9–10.3)
Chloride: 111 mmol/L (ref 98–111)
Creatinine, Ser: 0.81 mg/dL (ref 0.44–1.00)
GFR, Estimated: >60 mL/min (ref >60)
Glucose, Bld: 82 mg/dL (ref 70–99)
Potassium: 4 mmol/L (ref 3.5–5.1)
Sodium: 144 mmol/L (ref 135–145)

## 2021-09-12 MED ORDER — LORAZEPAM 2 MG/ML IJ SOLN
0.5000 mg | Freq: Four times a day (QID) | INTRAMUSCULAR | Status: DC | PRN
  Administered 2021-09-12 – 2021-09-14 (×2): 0.5 mg via INTRAVENOUS
  Filled 2021-09-12 (×2): qty 1

## 2021-09-12 MED ORDER — TAMSULOSIN HCL 0.4 MG PO CAPS
0.4000 mg | ORAL_CAPSULE | Freq: Every day | ORAL | Status: DC
  Administered 2021-09-13 – 2021-09-17 (×5): 0.4 mg via ORAL
  Filled 2021-09-12 (×6): qty 1

## 2021-09-12 MED ORDER — DOXYCYCLINE MONOHYDRATE 25 MG/5ML PO SUSR
100.0000 mg | Freq: Two times a day (BID) | ORAL | Status: DC
  Administered 2021-09-12 – 2021-09-16 (×9): 100 mg
  Filled 2021-09-12 (×14): qty 20

## 2021-09-12 NOTE — Progress Notes (Signed)
Overall numbers as well.  Methicillin-resistant PROGRESS NOTE    Kristy [name]  WUJ:[id number] DOB: [birth year] DOA: 08/28/2021 PCP: Kristy Bussing, MD   Chief Complaint  Patient presents with   Altered Mental Status   Brief Narrative/Hospital Course:  Kristy [name], 75 y.o. female with PMH of alcohol abuse, admitted on 06/10/2021 for encephalopathy, cervical spine discitis and osteomyelitis for which she underwent I&D with anterior cervical discectomy C5-C6, and treated with IV vancomycin and Rocephin, then cefazolin with rifampin, dysphagia and malnutrition requiring PEG tube on 07/11/2021 and urinary tension requiring Foley catheter.     Readmitted on 07/20/2021 with altered mental status on and found to have acute ischemic pontine infarct.  Patient was not on antiplatelet or anticoagulation due to thrombocytopenia and risk of bleeding.  Imaging also showed concern of discitis and osteomyelitis T12-L1 neurosurgery was reconsulted recommended no further surgical intervention.  ID was reconsulted on 07/24/2021 recommended to continue cefazolin.  Palliative care was consulted on 07/26/2021 and family wanted full scope of treatment. Patient was discharged back to [facility] on 08/06/2021 on IV Ancef through 08/25/2021.     Patient was sent back to ED on 08/28/2021 for shortness of breath and AMS.  She had a cardiac arrest while in ED requiring CPR and epinephrine with cardioversion, and admitted to ICU with respiratory failure requiring intubation and mechanical ventilation in the setting of possible aspiration pneumonia and encephalopathy.     Patient was extubated and transferred to [facility] on 09/03/2021.  Main issue is recurrent hypoglycemia despite continuous tube feed.  A.m. cortisol within normal.  Thyroid panel suggests hypothyroidism with elevated TSH to 57 and low free T4 to 0.6.  Started on Synthroid 100 mcg per G-tube after discussion with pharmacy.   Subjective: Seen  examined this am Sugar stable,on tube feeds No hypoglycemia - sugar stable Oriented to self/ place, people.  Assessment & Plan:  Acute hypoxic respiratory failure Aspiration pneumonia Cardiac arrest likely due to aspiration needing intubation:  Patient is alert awake follows commands.  But pleasantly confused.Currently on room air.  Speech following -on dysphagia 1 diet, continuing aspiration precaution. Continue tube feeding.  Continue supportive care.  Fall precaution delirium precaution.   Staph HemOlyticus/Acinetobacter Calcoaceticus/Baumanii complex with septic shock POA: Hemodynamically stable CT abdomen pelvis no source of infection, repeat cultures are negative as per ID-Unasyn while inpatient then Bactrim on discharge to complete 14 days total therapy in 11/2. Cervical spine discitis and osteomyelitis at I&D and anterior cervical discectomy C5-C6 in previous hospitalization-completed antibiotics.  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  Readmitted 9/10, advised to continue cefazolin through 08/25/2021 for progressive/persistent cervical and thoracic osteomyelitis. Will remove PICC line prior to discharge.  I will check with Dr. Ilsa [name] for any other recommendations.  Acute/Subacute metabolic encephalopathy Recurrent encephalopathy suspect now has chronic component: Initially concerned about TBI following cardiac arrest, but mentation improved.  She is oriented to self place situation but not time.Continue supportive care delirium precaution reorientation minimize psychotropic medications reduce  Chronic indwelling Foley catheter: Being changed every 30 days.  Daughter wants to see if this can be discontinued prior to going home .  I explained that she has failed multiple voiding trial will need outpatient  follow-up with urology,- due to daughter's concern will attempt to discontinue catheter today start Flomax, monitor overnight for  voiding trial.   Recent acute CVA: Cont aspirin and statin   Cardiomyopathy/systolic dysfunction: Unclear etiology compensated.  Unclear etiology her echocardiogram while in ICU following the cardiac arrest showed EF 40-45% with wall motion abnormalities severe hypokinesis/akinesis of the left ventricle; she will need to follow-up with cardiology - we will check with cardiology prior to discharge to home- cards consulted.   ATN due to sepsis resolved Mild hyponatremia likely insensible loss resolved with free water Acute on chronic anemia Anemia coronary disease: 1 unit PRBC transfused 10/21.H&H 7-8 gm, transfusion threshold less than 7 g. Recent Labs  Lab 09/07/21 0542 09/08/21 0329 09/09/21 0409 09/11/21 0341 09/12/21 0242  HGB 8.3* 7.8* 7.8* 7.8* 7.8*  HCT 26.9* 25.9* 25.4* 24.7* 26.2*      Recurrent hypoglycemia, patient cortisol was low previously on Solu-Cortef, placed on Synthroid for hypothyroidism as it could be contributory, continue to feed and monitor Recent Labs  Lab 09/11/21 2033 09/12/21 0021 09/12/21 0433 09/12/21 0731 09/12/21 1122  GLUCAP 89 87 86 79 87    Profound physical deconditioning Debility: Continue PT OT as tolerated.  She will need placement again.   Hypotension: BP stable.  Off Solu-Cortef.  On midodrine   Hypothyroidism started on Synthroid 100 MCG TSH was 57 Free T4 0.6, follow-up TSH in 4  weeks to adjust dose   Chronic skin dermatitis/pruritic rash: Continue thiamine vitamin C Benadryl for itching, Eucerin.  Continue hypothyroidism treatment   Hypomagnesemia/hypokalemia: Monitor and replete   Goals of care/recurrent admissions: Prognosis not bright in this unfortunate female who has had multiple hospitalizations and continues to do poorly.  Palliative care had seen the patient multiple times.  At this time family  requesting full aggressive care and full code. Daughter understands overall guarded prognosis but at this time her plan is to take the patient home and provide 24/7 care she is concerned about patient returning to the nursing facility in this flu season.    Severe protein-calorie malnutrition in the setting of chronic illness of malnutrition   Pressure injury of skin of coccyx right, medial sacrum and right buttock stage II, present on admission are normal.  See below-  Pressure injury of skin of coccyx right, medial sacrum and right buttock stage II, present on admission are normal.  See below- Pressure Injury 08/28/21 Coccyx Right Stage 2 -  Partial thickness loss of dermis presenting as a shallow open injury with a red, pink wound bed without slough. (Active)  08/28/21 1030  Location: Coccyx  Location Orientation: Right  Staging: Stage 2 -  Partial thickness loss of dermis presenting as a shallow open injury with a red, pink wound bed without slough.  Wound Description (Comments):   Present on Admission: Yes     Pressure Injury 08/28/21 Sacrum Medial Stage 2 -  Partial thickness loss of dermis presenting as a shallow open injury with a red, pink wound bed without slough. (Active)  08/28/21 1030  Location: Sacrum  Location Orientation: Medial  Staging: Stage 2 -  Partial thickness loss of dermis presenting as a shallow open injury with a red, pink wound bed without slough.  Wound Description (Comments):   Present on Admission:YES     Pressure Injury 08/28/21 Buttocks Right Stage 2 -  Partial thickness loss of dermis presenting as a shallow open injury with a red, pink wound bed without slough. (Active)  08/28/21 1030  Location: Buttocks  Location Orientation: Right  Staging: Stage 2 -  Partial thickness loss of dermis presenting  as a shallow open injury with a red, pink wound bed without slough.  Wound Description (Comments):   Present on Admission: YES   DVT prophylaxis: enoxaparin  (LOVENOX) injection 40 mg Start: 09/10/21 1400 Place and maintain sequential compression device Start: 08/31/21 1102 SCDs Start: 08/28/21 0819 Code Status:   Code Status: Full Code Family Communication: plan of care discussed with patient AND rn at bedside.  No family at bedside Status is: Inpatient Remains inpatient appropriate because: For ongoing management of multiple issues, unsafe disposition will need placement Anticipate discharge tomorrow home with home health services.-She is high risk for readmission and decompensation.  Objective: Vitals last 24 hrs: Vitals:   09/11/21 2000 09/12/21 0000 09/12/21 0400 09/12/21 0800  BP:  126/70 125/68 (!) 131/58  Pulse:    96  Resp: 14   15  Temp: 98.6 F (37 C) 98.9 F (37.2 C) 100 F (37.8 C) 98.6 F (37 C)  TempSrc: Axillary Oral Axillary Axillary  SpO2:    100%  Weight:      Height:       Weight change:   Intake/Output Summary (Last 24 hours) at 09/12/2021 1441 Last data filed at 09/12/2021 0540 Gross per 24 hour  Intake 1541 ml  Output 2800 ml  Net -1259 ml    Net IO Since Admission: -10,901.7 mL [09/12/21 1441]   Physical Examination:  General exam: AAOx 2, older than stated age, weak appearing. HEENT:Oral mucosa moist, Ear/Nose WNL grossly, dentition normal. Respiratory system: bilaterally diminished,  no use of accessory muscle Cardiovascular system: S1 & S2 +, No JVD,. Gastrointestinal system: Abdomen soft, PEG present NT,ND, BS+ Nervous System:Alert, awake, moving extremities and grossly nonfocal Extremities: Chronic appearing lower leg skin changes, left upper extremity with mild erythema at the BP cuff site Skin: No rashes,no icterus. MSK: Normal muscle bulk,tone, power .  Medications reviewed:  Scheduled Meds:  vitamin C  500 mg Per Tube Daily   aspirin  81 mg Per Tube Daily   atorvastatin  40 mg Per Tube q1800   chlorhexidine  15 mL Mouth Rinse BID   Chlorhexidine Gluconate Cloth  6 each Topical Daily    enoxaparin (LOVENOX) injection  40 mg Subcutaneous Q24H   feeding supplement (PROSource TF)  45 mL Per Tube Daily   free water  100 mL Per Tube Q6H   hydrocerin   Topical BID   levothyroxine  100 mcg Per Tube Q0600   mouth rinse  15 mL Mouth Rinse q12n4p   midodrine  5 mg Per Tube TID WC   nutrition supplement (JUVEN)  1 packet Per Tube BID BM   QUEtiapine  25 mg Per Tube QHS   sodium chloride flush  10-40 mL Intracatheter Q12H   tamsulosin  0.4 mg Oral Daily   thiamine injection  100 mg Intravenous Daily   Continuous Infusions:  ampicillin-sulbactam (UNASYN) IV 3 g (09/12/21 1015)   feeding supplement (JEVITY 1.5 CAL/FIBER) 1,500 mL (09/12/21 1017)   Diet Order             DIET - DYS 1 Room service appropriate? No; Fluid consistency: Thin  Diet effective now                   Nutrition Problem: Severe Malnutrition Etiology: acute illness (bacteremia) Signs/Symptoms: mild fat depletion, moderate muscle depletion, percent weight loss (11.7% weight loss in less than 2 months) Percent weight loss: 11.7 % Interventions: Tube feeding, Prostat  Weight change:   Wt Readings  from Last 3 Encounters:  09/05/21 77.6 kg  07/19/21 84.1 kg  07/16/21 84.1 kg     Consultants:see note  Procedures:see note Antimicrobials: Anti-infectives (From admission, onward)    Start     Dose/Rate Route Frequency Ordered Stop   09/05/21 1415  fluconazole (DIFLUCAN) tablet 150 mg  Status:  Discontinued        150 mg Per Tube Daily 09/05/21 1320 09/05/21 1320   09/05/21 1415  fluconazole (DIFLUCAN) 40 MG/ML suspension 200 mg        200 mg Per Tube  Once 09/05/21 1320 09/05/21 1524   08/29/21 1200  vancomycin (VANCOREADY) IVPB 1250 mg/250 mL  Status:  Discontinued        1,250 mg 166.7 mL/hr over 90 Minutes Intravenous Every 24 hours 08/28/21 1113 08/29/21 0726   08/29/21 1200  vancomycin (VANCOCIN) IVPB 1000 mg/200 mL premix        1,000 mg 200 mL/hr over 60 Minutes Intravenous Every 24 hours  08/29/21 0726 09/01/21 1413   08/28/21 2300  Ampicillin-Sulbactam (UNASYN) 3 g in sodium chloride 0.9 % 100 mL IVPB        3 g 200 mL/hr over 30 Minutes Intravenous Every 6 hours 08/28/21 2200 09/13/21 0559   08/28/21 1800  ceFEPIme (MAXIPIME) 2 g in sodium chloride 0.9 % 100 mL IVPB  Status:  Discontinued        2 g 200 mL/hr over 30 Minutes Intravenous Every 12 hours 08/28/21 1113 08/28/21 2200   08/28/21 1200  vancomycin (VANCOREADY) IVPB 1750 mg/350 mL        1,750 mg 175 mL/hr over 120 Minutes Intravenous  Once 08/28/21 1113 08/28/21 1348   08/28/21 0645  vancomycin (VANCOREADY) IVPB 1750 mg/350 mL  Status:  Discontinued        1,750 mg 175 mL/hr over 120 Minutes Intravenous  Once 08/28/21 0630 08/28/21 1139   08/28/21 0615  ceFEPIme (MAXIPIME) 2 g in sodium chloride 0.9 % 100 mL IVPB        2 g 200 mL/hr over 30 Minutes Intravenous  Once 08/28/21 0608 08/28/21 2045      Culture/Microbiology    Component Value Date/Time   SDES BLOOD LEFT HAND 08/29/2021 1220   SPECREQUEST  08/29/2021 1220    BOTTLES DRAWN AEROBIC AND ANAEROBIC Blood Culture results may not be optimal due to an inadequate volume of blood received in culture bottles   CULT  08/29/2021 1220    NO GROWTH 5 DAYS Performed at [facility], [place] [address], [place], [place] [address]    REPTSTATUS 09/03/2021 FINAL 08/29/2021 1220    Other culture-see note  Unresulted Labs (From admission, onward)     Start     Ordered   09/17/21 0500  Creatinine, serum  (enoxaparin (LOVENOX)    CrCl >/= 30 ml/min)  Weekly,   R     Comments: while on enoxaparin therapy   Question:  Specimen collection method  Answer:  Unit=Unit collect   09/10/21 1302          Data Reviewed: I have personally reviewed following labs and imaging studies CBC: Recent Labs  Lab 09/07/21 0542 09/08/21 0329 09/09/21 0409 09/11/21 0341 09/12/21 0242  WBC 9.0 9.0 8.0 6.2 7.9  HGB 8.3* 7.8* 7.8* 7.8* 7.8*  HCT 26.9* 25.9* 25.4* 24.7*  26.2*  MCV 105.1* 105.7* 103.7* 102.9* 105.6*  PLT 143* 146* 149* 158 [id number]    Basic Metabolic Panel: Recent Labs  Lab 09/06/21 0200  09/07/21 0542 09/07/21 1408 09/08/21 0329 09/08/21 0330 09/09/21 0409 09/11/21 0341 09/12/21 0242  NA 146*  --  141  --  141 137 141 144  K 4.1  --   --   --  3.6 3.7 3.9 4.0  CL 113*  --   --   --  106 103 108 111  CO2 27  --   --   --  28 28 25 25   GLUCOSE 99  --   --   --  97 106* 87 82  BUN 33*  --   --   --  27* 24* 40* 52*  CREATININE 0.97  --   --   --  0.69 0.69 0.72 0.81  CALCIUM 8.6*  --   --   --  8.7* 8.6* 8.9 9.2  MG 2.2 1.7  --  1.5*  --  1.9 1.6*  --   PHOS 4.2  --   --   --  3.5 3.6 3.5  --     GFR: Estimated Creatinine Clearance: 63.1 mL/min (by C-G formula based on SCr of 0.81 mg/dL). Liver Function Tests: Recent Labs  Lab 09/06/21 0200 09/08/21 0330 09/09/21 0409 09/11/21 0341  ALBUMIN 2.2* 2.1* 2.1* 2.4*    No results for input(s): LIPASE, AMYLASE in the last 168 hours. No results for input(s): AMMONIA in the last 168 hours. Coagulation Profile: No results for input(s): INR, PROTIME in the last 168 hours. Cardiac Enzymes: Recent Labs  Lab 09/08/21 0329 09/09/21 0409  CKTOTAL 30* 29*    BNP (last 3 results) No results for input(s): PROBNP in the last 8760 hours. HbA1C: No results for input(s): HGBA1C in the last 72 hours. CBG: Recent Labs  Lab 09/11/21 2033 09/12/21 0021 09/12/21 0433 09/12/21 0731 09/12/21 1122  GLUCAP 89 87 86 79 87    Lipid Profile: No results for input(s): CHOL, HDL, LDLCALC, TRIG, CHOLHDL, LDLDIRECT in the last 72 hours. Thyroid Function Tests: No results for input(s): TSH, T4TOTAL, FREET4, T3FREE, THYROIDAB in the last 72 hours.  Anemia Panel: No results for input(s): VITAMINB12, FOLATE, FERRITIN, TIBC, IRON, RETICCTPCT in the last 72 hours. Sepsis Labs: No results for input(s): PROCALCITON, LATICACIDVEN in the last 168 hours.  No results found for this or any previous  visit (from the past 240 hour(s)).   Radiology Studies: DG CHEST PORT 1 VIEW  Result Date: 09/10/2021 CLINICAL DATA:  PICC placement. EXAM: PORTABLE CHEST 1 VIEW COMPARISON:  Chest radiograph 08/28/2021 FINDINGS: Right upper extremity PICC tip is in the mid-lower SVC. The previous right internal jugular catheter is been removed. Interval extubation and removal of enteric tube. Mild cardiomegaly persists. Improved bilateral pleural effusions and airspace opacity. Minimal residual blunting of costophrenic angles. Surgical hardware in the lower cervical spine. IMPRESSION: 1. Tip of the right upper extremity PICC in the mid-lower SVC. 2. Improved bilateral pleural effusions and airspace opacity. Electronically Signed   By: Keith Rake M.D.   On: 09/10/2021 22:53     LOS: 15 days   Antonieta Pert, MD [facility]  09/12/2021, 2:41 PM

## 2021-09-12 NOTE — Progress Notes (Addendum)
Pt has become violent and swinging trying to hit this nurse and another. Very AMS, trying to get out of bed will not let me touch her with out trying to spit, kick and hit. Will not take any of her due meds. Bed alarm is on and Dr. Dayna Barker has been notified.Floor mats have been put down also.

## 2021-09-12 NOTE — Progress Notes (Signed)
Speech Language Pathology Treatment: Dysphagia  Patient Details Name: Kristy [name] MRN: [id number] DOB: 1946/01/24 Today's Date: 09/12/2021 Time: 3536-1443 SLP Time Calculation (min) (ACUTE ONLY): 11 min  Assessment / Plan / Recommendation Clinical Impression  Pt was seen for dysphagia treatment. She was alert throughout the session, and she reported that she has not been hungry. Pt accepted thin liquids via straw and tolerated multiple swallows without overt s/sx of aspiration despite suboptimal positioning; she was slightly reclined and demonstrated a right lateral lean. Pt refused all solids despite encouragement. Pt's current diet of dysphagia 1 solids and thin liquids will be continued at this time. SLP will continue to follow pt briefly to assess potential for diet advancement.     HPI HPI: Patient is a 75 y.o. female with PMH: ETOH abuse, prior admission on 06/10/2021 for encephalopathy, cervical spine discitis and osteomyelitis for which she underwent I&D with ACDF C5-C6, dysphagia and malnutrition requiring PEG tube on 07/11/2021 and urinary tension requiring Foley catheter. 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 She was discharged 9/27 to SNF.   Patient presented to ED 10/19 for SOB and AMS. She had a cardiac arrest while in ED requiring CPR. She was admitted to ICU with respiratory failure requiring intubation and mechanical ventilation in setting of possible aspiration PNA and encephalopathy. She was extubated on 10/25. MBS during previous hospital stay on 8/15 revealed moderately impaired dysphagia from both mechanical and cognitive components. Patient was upgraded to dys 3 solids and thin liquids however PEG was placed due to poor intake.      SLP Plan  Continue with current plan of care      Recommendations for follow up therapy are one component of a multi-disciplinary discharge planning process, led by the attending physician.  Recommendations may be  updated based on patient status, additional functional criteria and insurance authorization.    Recommendations  Diet recommendations: Dysphagia 1 (puree);Thin liquid Liquids provided via: Cup;Straw Medication Administration: Via alternative means Supervision: Trained caregiver to feed patient Compensations: Slow rate;Small sips/bites Postural Changes and/or Swallow Maneuvers: Seated upright 90 degrees                Oral Care Recommendations: Oral care BID;Staff/trained caregiver to provide oral care Follow up Recommendations: 24 hour supervision/assistance;Skilled Nursing facility SLP Visit Diagnosis: Dysphagia, unspecified (R13.10) Plan: Continue with current plan of care       Michaelah Credeur I. Vear Clock, MS, CCC-SLP Acute Rehabilitation Services Office number [phone] Pager [phone]                 [name]  09/12/2021, 11:02 AM

## 2021-09-12 NOTE — Progress Notes (Signed)
PROGRESS NOTE    Kristy [name]  OEH:[id number] DOB: 1945/12/19 DOA: 08/28/2021 PCP: Kristy Amel, MD   Chief Complaint  Patient presents with   Altered Mental Status  Brief Narrative/Hospital Course: Kristy [name], 75 y.o. female with PMH of alcohol abuse, admitted on 06/10/2021 for encephalopathy, cervical spine discitis and osteomyelitis for which she underwent I&D with anterior cervical discectomy C5-C6, and treated with IV vancomycin and Rocephin, then cefazolin with rifampin, dysphagia and malnutrition requiring PEG tube on 07/11/2021 and urinary retension requiring Foley catheter.     Readmitted on 07/20/2021 with altered mental status on and found to have acute ischemic pontine infarct.  Patient was not on antiplatelet or anticoagulation due to thrombocytopenia and risk of bleeding.  Imaging also showed concern of discitis and osteomyelitis T12-L1 neurosurgery was reconsulted recommended no further surgical intervention.  ID was reconsulted on 07/24/2021 recommended to continue cefazolin.  Palliative care was consulted on 07/26/2021 and family wanted full scope of treatment. [name]21 on IV Ancef through 08/25/2021.     Patient was sent back to ED on 08/28/2021 for shortness of breath and AMS.  She had a cardiac arrest while in ED requiring CPR and epinephrine with cardioversion, and admitted to ICU with respiratory failure requiring intubation and mechanical ventilation in the setting of possible aspiration pneumonia and encephalopathy.     Patient was extubated and transferred to [name] on 09/03/2021.  Main issue is recurrent hypoglycemia despite continuous tube feed.  A.m. cortisol within normal.  Thyroid panel suggests hypothyroidism with elevated TSH to 57 and low free T4 to 0.6.  Started on Synthroid 100 mcg per G-tube after discussion with pharmacy. She had Acinetobacter bacteremia treated with [name] and completed 14 days  course in the hospital.  Per ID okay to pull IV line out prior to discharge.  Seen by PT OT at this time daughter is planning to take the patient home with home health and palliative care follow-up as outpatient. Discussed with ID plan to keep on suppressive therapy with [name] for 30 days  Her recent course since August includes 08/01 presented to [name] ED for mechanical fall x2 with severe right neck pain. Seen with elevated LFTs as well with reported ETOH use. Head and neck CT negative. Admitted to [name] for deconditioning and acute encephalopathy  08/05 encephalopathy worsened and neuro and psychiatry was consulted. Psych was concerned for UTI for which UA was obtained and positive  08/07 MRI cervical, lumbar, and thoracic spine revealed discitis osteomyelitis at C5-6, C6-7, and possibly T1-2. 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      NSGY consulted and recommended surgical evacuation > C5-C6 Anterior Cervical Discectomy and Instrumented Fusion preformed later that day with large amount of pus seen  Treated with vancomycin and ceftriaxone  08/09 ABT changed to IV nafcillin for 8/7 epidural abscess cultures growing MSS ID discharge ABT plan: [name] dosed 8/07 - 06/30/21 followed by Cefazolin pharmacy dosed 8/22 - 08/11/2021 with Rifampin 600 mg BID to be continued throughout the treatment as she can tolerate  08/18 ENT consulted for right nostril epistaxis  08/23 PEG tube placed  09/01 discharged to [name]) in stable condition, had PEG tube in place for severe protein calorie malnutrition with high aspiration risk.  Also had indwelling Foley in place due to recurrent urinary retention. 09/10 presented back to [name] emergency department due to altered mental status  and concern for underlying infection.  Request per family made to transfer to outside facility but no beds currently available.   CT/MRI imaging on admission revealed  acute ischemic pontine infarct additionally concern for T12 and L1  discitis/osteomyelitis 9/11 Neurosurgery reconsulted and recommended against any further surgical interventions for persistent/progressive cervical and thoracic osteomyelitis 9/13:Repeat MRI brain w/ contrast:no intracranial focus of abnormal contrast 9/14 ID recommended to continue with cefazolin through 08/25/2021, outpatient ID follow-up.  Neurology and neurosurgery has signed off. 9/16 palliative care consulted full scope of treatment requested  9/27 discharged to [name]) ID  advised to continue Cefazolin through 08/25/2021 w/ PICC line"   Subjective: Seen this am- overnight agitated trying to get out of bed, was given [name]. Sleepy this am Rn was abel to remove foley out for voiding trial today, did not let remove foley yesterday.  Assessment & Plan: Acute hypoxic respiratory failure Aspiration pneumonia Cardiac arrest likely due to aspiration needing intubation:  Patient is alert awake follows commands.  But pleasantly confused.Currently on room air.  Speech following for safety of oral intake, on tube feeding.  Continue supportive care.  Fall precaution delirium precaution.   Staph HemOlyticus/Acinetobacter Calcoaceticus/Baumanii complex with septic shock POA: Hemodynamically stable CT abdomen pelvis no source of infection, repeat cultures are negative as per ID-she completed [name] 11/3.   Acute metabolic encephalopathy Recurrent encephalopathy suspect now has chronic component: Initially concern about TBI following cardiac arrest, but mentation improved. Overnight agitated- this am sleepy/sedated. Continue supportive care delirium precaution reorientation minimize psychotropic medications, cont her [name].   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  Readmitted 9/10, advised to continue cefazolin through 08/25/2021 for progressive/persistent cervical and thoracic osteomyelitis. Will remove PICC line prior to discharge.Discussed with Dr Laurena Bering to  keep on suppressive therapy with [name] for 30 days - ESR CRP stable  Chronic indwelling Foley catheter: Being changed every 30 days.  Daughter wants to see if this can be discontinued prior to going home .  I explained that she has failed multiple voiding trial will need outpatient follow-up with urology, attempted to remove x2 unsuccessful 11/3- this am abel to remove it- bladder scan in 6 hrs/cont TOV. Cont Flomax.  Recent acute SWF:UXNAT aspirin and statin  Systolic dysfunction: Compensated.  Unclear etiology her echocardiogram while in ICU following the cardiac arrest showed EF 40-45% with wall motion abnormalities severe hypokinesis/akinesis of the left ventricle; she will need to follow-up with cardiology  seen by cardiology 11/3. This is likely in the setting of respiratory arrest.   ATN due to sepsis resolved Mild hyponatremia likely insensible loss resolved with free water.  Acute on chronic anemia 1 unit PRBC transfused 10/21.H&H 7-8 gm, transfusion threshold less than 7 g.   Recurrent hypoglycemia, patient cortisol was low previously on Solu-Cortef, placed on Synthroid for hypothyroidism as it could be contributory, continue to feed and monitor. CBG is stable.  Profound physical deconditioning Debility: Continue PT OT as tolerated.  daughter now plans to take her home   Chronic Hypotension: BP stable. Off Solu-Cortef.  cont midodrine   Hypothyroidism started on Synthroid 100 MCG TSH was 57 Free T4 0.6, follow-up TSH in 4 to 6 weeks to adjust dose   Chronic skin dermatitis/pruritic rash: Continue thiamine vitamin C Benadryl for itching, Eucerin.  Continue hypothyroidism treatment   Hypomagnesemia/hypokalemia: Monitor  and replete   Goals of  care/recurrent admissions: Prognosis not bright in this unfortunate female who has had multiple hospitalizations and continues to do poorly. Palliative care had seen the patient multiple times.  At this time family requesting full aggressive care and full code. Daughter planning to take her home. She is high risk for readmission.   Severe protein-calorie malnutrition in the setting of chronic illness of malnutrition   pressure injury of skin of coccyx right, medial sacrum and right buttock stage II, present on admission are normal.  See below-  Pressure Ulcer: Pressure Injury 08/28/21 Coccyx Right Stage 2 -  Partial thickness loss of dermis presenting as a shallow open injury with a red, pink wound bed without slough. (Active)  08/28/21 1030  Location: Coccyx  Location Orientation: Right  Staging: Stage 2 -  Partial thickness loss of dermis presenting as a shallow open injury with a red, pink wound bed without slough.  Wound Description (Comments):   Present on Admission: Yes     Pressure Injury 08/28/21 Sacrum Medial Stage 2 -  Partial thickness loss of dermis presenting as a shallow open injury with a red, pink wound bed without slough. (Active)  08/28/21 1030  Location: Sacrum  Location Orientation: Medial  Staging: Stage 2 -  Partial thickness loss of dermis presenting as a shallow open injury with a red, pink wound bed without slough.  Wound Description (Comments):   Present on Admission:      Pressure Injury 08/28/21 Buttocks Right Stage 2 -  Partial thickness loss of dermis presenting as a shallow open injury with a red, pink wound bed without slough. (Active)  08/28/21 1030  Location: Buttocks  Location Orientation: Right  Staging: Stage 2 -  Partial thickness loss of dermis presenting as a shallow open injury with a red, pink wound bed without slough.  Wound Description (Comments):   Present on Admission:    DVT prophylaxis: enoxaparin (LOVENOX) injection 40 mg Start: 09/10/21  1400 Place and maintain sequential compression device Start: 08/31/21 1102 SCDs Start: 08/28/21 0263 Code Status:   Code Status: Full Code Family Communication: plan of care discussed with patient at bedside. Status is: Inpatient Remains inpatient appropriate because: for management of agitation overnight, TOV. Unsafe dispo.  D/C over the weekend if mentation better. If fails TOV-reinsert foley  Objective: Vitals last 24 hrs: Vitals:   09/13/21 0800 09/13/21 0940 09/13/21 1025 09/13/21 1205  BP: (!) 88/51 (!) 109/59 117/81 (!) 99/56  Pulse: 96 91 (!) 102 94  Resp:   16 20  Temp:    (!) 97.5 F (36.4 C)  TempSrc:    Oral  SpO2: 100% 100% 100% 100%  Weight:      Height:       Weight change:   Intake/Output Summary (Last 24 hours) at 09/13/2021 1245 Last data filed at 09/13/2021 1000 Gross per 24 hour  Intake 3290 ml  Output 1650 ml  Net 1640 ml   Net IO Since Admission: -9,261.7 mL [09/13/21 1245]   Physical Examination: General exam: sleepy/sedated. Mitten in place.elderly, weaker than stated age. HEENT:Oral mucosa moist, Ear/Nose WNL grossly,dentition normal. Respiratory system: B/l clear BS, no use of accessory muscle, non tender. Cardiovascular system: S1 & S2 +, PEG+, No JVD. Gastrointestinal system: Abdomen soft, NT,ND, BS+. Nervous System:Alert, awake, moving extremities. Extremities: edema /Chronic hyperpigmentation and skin changes Skin: chronic rashes, no icterus. MSK: Normal muscle bulk, tone, power.  Medications reviewed:  Scheduled Meds:  vitamin C  500  mg Per Tube Daily   aspirin  81 mg Per Tube Daily   atorvastatin  40 mg Per Tube q1800   chlorhexidine  15 mL Mouth Rinse BID   Chlorhexidine Gluconate Cloth  6 each Topical Daily   [name]  100 mg Per Tube BID   enoxaparin (LOVENOX) injection  40 mg Subcutaneous Q24H   feeding supplement (PROSource TF)  45 mL Per Tube Daily   free water  100 mL Per Tube Q6H   hydrocerin   Topical BID    levothyroxine  100 mcg Per Tube Q0600   mouth rinse  15 mL Mouth Rinse q12n4p   midodrine  5 mg Per Tube TID WC   nutrition supplement (JUVEN)  1 packet Per Tube BID BM   QUEtiapine  25 mg Per Tube QHS   sodium chloride flush  10-40 mL Intracatheter Q12H   tamsulosin  0.4 mg Oral Daily   thiamine injection  100 mg Intravenous Daily   Continuous Infusions:  feeding supplement (JEVITY 1.5 CAL/FIBER) 1,500 mL (09/13/21 7902)   Diet Order             DIET - DYS 1 [name] appropriate? No; Fluid consistency: Thin  Diet effective now                   Nutrition Problem: Severe Malnutrition Etiology: acute illness (bacteremia) Signs/Symptoms: mild fat depletion, moderate muscle depletion, percent weight loss (11.7% weight loss in less than 2 months) Percent weight loss: 11.7 % Interventions: Tube feeding, Prostat  Weight change:   Wt Readings from Last 3 Encounters:  09/05/21 77.6 kg  07/19/21 84.1 kg  07/16/21 84.1 kg     Consultants:see note  Procedures:see note Antimicrobials: Anti-infectives (From admission, onward)    Start     Dose/Rate Route Frequency Ordered Stop   09/12/21 2200  [name] (VIBRAMYCIN) 25 MG/5ML suspension 100 mg        100 mg Per Tube 2 times daily 09/12/21 1611     09/05/21 1415  fluconazole (DIFLUCAN) tablet 150 mg  Status:  Discontinued        150 mg Per Tube Daily 09/05/21 1320 09/05/21 1320   09/05/21 1415  fluconazole (DIFLUCAN) 40 MG/ML suspension 200 mg        200 mg Per Tube  Once 09/05/21 1320 09/05/21 1524   08/29/21 1200  vancomycin (VANCOREADY) IVPB 1250 mg/250 mL  Status:  Discontinued        1,250 mg 166.7 mL/hr over 90 Minutes Intravenous Every 24 hours 08/28/21 1113 08/29/21 0726   08/29/21 1200  vancomycin (VANCOCIN) IVPB 1000 mg/200 mL premix        1,000 mg 200 mL/hr over 60 Minutes Intravenous Every 24 hours 08/29/21 0726 09/01/21 1413   08/28/21 2300  Ampicillin-Sulbactam ([name]) 3 g in sodium chloride 0.9 % 100 mL  IVPB  Status:  Discontinued        3 g 200 mL/hr over 30 Minutes Intravenous Every 6 hours 08/28/21 2200 09/12/21 1611   08/28/21 1800  ceFEPIme (MAXIPIME) 2 g in sodium chloride 0.9 % 100 mL IVPB  Status:  Discontinued        2 g 200 mL/hr over 30 Minutes Intravenous Every 12 hours 08/28/21 1113 08/28/21 2200   08/28/21 1200  vancomycin (VANCOREADY) IVPB 1750 mg/350 mL        1,750 mg 175 mL/hr over 120 Minutes Intravenous  Once 08/28/21 1113 08/28/21 1348   08/28/21 0645  vancomycin (  VANCOREADY) IVPB 1750 mg/350 mL  Status:  Discontinued        1,750 mg 175 mL/hr over 120 Minutes Intravenous  Once 08/28/21 0630 08/28/21 1139   08/28/21 0615  ceFEPIme (MAXIPIME) 2 g in sodium chloride 0.9 % 100 mL IVPB        2 g 200 mL/hr over 30 Minutes Intravenous  Once 08/28/21 0608 08/28/21 2045      Culture/Microbiology    Component Value Date/Time   SDES BLOOD LEFT HAND 08/29/2021 1220   SPECREQUEST  08/29/2021 1220    BOTTLES DRAWN AEROBIC AND ANAEROBIC Blood Culture results may not be optimal due to an inadequate volume of blood received in culture bottles   CULT  08/29/2021 1220    NO GROWTH 5 DAYS Performed at [facility], [place] [address] [place], [place] [address]    REPTSTATUS 09/03/2021 FINAL 08/29/2021 1220    Other culture-see note  Unresulted Labs (From admission, onward)     Start     Ordered   09/17/21 0500  Creatinine, serum  (enoxaparin (LOVENOX)    CrCl >/= 30 ml/min)  Weekly,   R     Comments: while on enoxaparin therapy   Question:  Specimen collection method  Answer:  Unit=Unit collect   09/10/21 1302          Data Reviewed: I have personally reviewed following labs and imaging studies CBC: Recent Labs  Lab 09/07/21 0542 09/08/21 0329 09/09/21 0409 09/11/21 0341 09/12/21 0242  WBC 9.0 9.0 8.0 6.2 7.9  HGB 8.3* 7.8* 7.8* 7.8* 7.8*  HCT 26.9* 25.9* 25.4* 24.7* 26.2*  MCV 105.1* 105.7* 103.7* 102.9* 105.6*  PLT 143* 146* 149* 158 370   Basic  Metabolic Panel: Recent Labs  Lab 09/07/21 0542 09/07/21 1408 09/08/21 0329 09/08/21 0330 09/09/21 0409 09/11/21 0341 09/12/21 0242  NA  --  141  --  141 137 141 144  K  --   --   --  3.6 3.7 3.9 4.0  CL  --   --   --  106 103 108 111  CO2  --   --   --  '28 28 25 25  ' GLUCOSE  --   --   --  97 106* 87 82  BUN  --   --   --  27* 24* 40* 52*  CREATININE  --   --   --  0.69 0.69 0.72 0.81  CALCIUM  --   --   --  8.7* 8.6* 8.9 9.2  MG 1.7  --  1.5*  --  1.9 1.6*  --   PHOS  --   --   --  3.5 3.6 3.5  --    GFR: Estimated Creatinine Clearance: 63.1 mL/min (by C-G formula based on SCr of 0.81 mg/dL). Liver Function Tests: Recent Labs  Lab 09/08/21 0330 09/09/21 0409 09/11/21 0341  ALBUMIN 2.1* 2.1* 2.4*   No results for input(s): LIPASE, AMYLASE in the last 168 hours. No results for input(s): AMMONIA in the last 168 hours. Coagulation Profile: No results for input(s): INR, PROTIME in the last 168 hours. Cardiac Enzymes: Recent Labs  Lab 09/08/21 0329 09/09/21 0409  CKTOTAL 30* 29*   BNP (last 3 results) No results for input(s): PROBNP in the last 8760 hours. HbA1C: No results for input(s): HGBA1C in the last 72 hours. CBG: Recent Labs  Lab 09/12/21 2003 09/12/21 2325 09/13/21 0402 09/13/21 0743 09/13/21 1208  GLUCAP 83 76 83 94  78   Lipid Profile: No results for input(s): CHOL, HDL, LDLCALC, TRIG, CHOLHDL, LDLDIRECT in the last 72 hours. Thyroid Function Tests: No results for input(s): TSH, T4TOTAL, FREET4, T3FREE, THYROIDAB in the last 72 hours. Anemia Panel: No results for input(s): VITAMINB12, FOLATE, FERRITIN, TIBC, IRON, RETICCTPCT in the last 72 hours. Sepsis Labs: No results for input(s): PROCALCITON, LATICACIDVEN in the last 168 hours.  No results found for this or any previous visit (from the past 240 hour(s)).   Radiology Studies: No results found.   LOS: 16 days   Antonieta Pert, MD [facility]  09/13/2021, 12:45 PM

## 2021-09-12 NOTE — Progress Notes (Signed)
2nd attempt to remove foley from pt. Pt will not allow this nurse to remove, pt is crossing legs and keeping closed tightly. Still squirming and difficult to redirect.

## 2021-09-12 NOTE — Consult Note (Signed)
Cardiology Consultation:   Patient ID: SAYDE TOP MRN: [id number]; DOB: 1946/07/28  Admit date: 08/28/2021 Date of Consult: 09/12/2021  PCP:  Lujean Amel, MD   [facility] Providers Cardiologist:  None   {   Patient Profile:   Kristy [name] is a 75 y.o. female with a hx of daily alcohol intake and recent diagnosed cervical spine discitis and osteomyelitis who presented with respiratory arrest is being seen 09/12/2021 for the evaluation of LV dysfunction on the recent echo at the request of Dr. Lupita Leash.  History of Present Illness:   [name] is a 75 year old female with past medical history of alcohol abuse and recent diagnosed cervical spine discitis and osteomyelitis.  Patient says she drinks 3-4 beers every night.  She was admitted in early August due to encephalopathy, cervical spine discitis and osteomyelitis and underwent I&D with anterior cervical discectomy C5-C6.  She was treated with IV antibiotic.  Echocardiogram obtained on 06/18/2021 showed EF 65 to 70%, grade 1 DD, mild MR.  She underwent PEG tube placement on 07/11/2021.  Patient was readmitted in mid September with altered mental status and found to have acute pontine infarct.  She was not placed on antiplatelet or anticoagulation due to thrombocytopenia and risk of bleeding.  Imaging also demonstrated discitis and osteomyelitis of T12-L1.  Neurosurgery recommended no surgery.  ID recommended continue cefazolin.  Echocardiogram obtained on 07/21/2021 showed EF 60 to 65%, no regional wall motion abnormality, mild LVH, mild MR.  Palliative care service was consulted on 9/16, family wanted full code.  Patient returned back to the hospital ED on 08/28/2021 with altered mental status and shortness of breath.  While waiting in the ED, she suffered a cardiac arrest that required CPR and epinephrine.  Patient was intubated on arrival and was eventually extubated on 10/25.  Echocardiogram obtained on the day of cardiac arrest showed EF 40 to  45%, mild LVH, severe hypokinesis and akinesis of the left ventricular, apical inferoseptal wall, inferior wall and apical segment, mild MR.  Mild to moderate AI.  When compared to the previous echocardiogram, LV function was felt to be significantly worse, wall motion abnormality was new.  Her cardiac arrest was felt to be due to aspiration.  Since extubation, she has been able to follow commands however she has been reportedly confused.  Cardiology service has been consulted for abnormal LV dysfunction.  Talking with the patient, she denied any recent chest pain.  She is able to communicate quite well however she thinks the year is 2023, she thinks she is currently living in [place] and that she does not know who is the current president of [place] is.  Despite the fact that she is only oriented to self, she is able to carry on normal conversation and has been able to follow command.  She denies any shortness of breath or chest discomfort at this time.   No past medical history on file.  Past Surgical History:  Procedure Laterality Date   ANTERIOR CERVICAL DECOMP/DISCECTOMY FUSION N/A 06/16/2021   Procedure: ANTERIOR CERVICAL DISCECTOMY FUSION Cervical five -Cervical six, Evacuation of Epidural Abscess;  Surgeon: Judith Part, MD;  Location: [place];  Service: Neurosurgery;  Laterality: N/A;   IR GASTROSTOMY TUBE MOD SED  07/02/2021     Home Medications:  Prior to Admission medications   Medication Sig Start Date End Date Taking? Authorizing Provider  acetaminophen (TYLENOL) 500 MG tablet Place 1 tablet (500 mg total) into feeding tube every 6 (  six) hours as needed for mild pain (or Fever >/= 101). 08/06/21  Yes Lanae Boast, MD  albuterol (PROVENTIL) (2.5 MG/3ML) 0.083% nebulizer solution Take 3 mLs (2.5 mg total) by nebulization every 6 (six) hours as needed for wheezing or shortness of breath. 08/06/21  Yes Lanae Boast, MD  allopurinol (ZYLOPRIM) 100 MG tablet Take 100 mg by mouth  daily.   Yes [provider]  amoxicillin-clavulanate (AUGMENTIN) 875-125 MG tablet Place 1 tablet into feeding tube 2 (two) times daily. For 7 days 08/27/21  Yes [provider]  aspirin 81 MG chewable tablet Place 1 tablet (81 mg total) into feeding tube daily. 08/07/21  Yes Lanae Boast, MD  atorvastatin (LIPITOR) 40 MG tablet Place 1 tablet (40 mg total) into feeding tube at bedtime. 08/06/21  Yes Lanae Boast, MD  bisacodyl (DULCOLAX) 10 MG suppository Place 10 mg rectally as needed for moderate constipation.   Yes [provider]  diclofenac Sodium (VOLTAREN) 1 % GEL Apply 4 g topically 4 (four) times daily. To neck 07/10/21  Yes Russella Dar, NP  folic acid (FOLVITE) 1 MG tablet Place 1 tablet (1 mg total) into feeding tube daily. 07/11/21  Yes Russella Dar, NP  hydrOXYzine (ATARAX/VISTARIL) 25 MG tablet Place 0.5 tablets (12.5 mg total) into feeding tube every 8 (eight) hours as needed for itching. 08/06/21  Yes Lanae Boast, MD  loperamide HCl (IMODIUM) 1 MG/7.5ML suspension Place 15 mLs (2 mg total) into feeding tube as needed for diarrhea or loose stools. 08/06/21  Yes Kc, Dayna Barker, MD  melatonin 3 MG TABS tablet Take 1 tablet (3 mg total) by mouth at bedtime. Patient taking differently: Place 3 mg into feeding tube at bedtime. 08/06/21  Yes Lanae Boast, MD  Multiple Vitamin (MULTIVITAMIN) LIQD Place 15 mLs into feeding tube daily. 07/11/21  Yes Russella Dar, NP  niacin 500 MG tablet Place 1 tablet (500 mg total) into feeding tube at bedtime. 07/10/21  Yes Russella Dar, NP  Nutritional Supplements (FEEDING SUPPLEMENT, JEVITY 1.2 CAL,) LIQD Place 1,000 mLs into feeding tube continuous.   Yes [provider]  OLANZapine (ZYPREXA) 2.5 MG tablet Place 1 tablet (2.5 mg total) into feeding tube daily. 08/07/21  Yes Kc, Dayna Barker, MD  OLANZapine (ZYPREXA) 5 MG tablet Place 1 tablet (5 mg total) into feeding tube at bedtime. 08/06/21  Yes Lanae Boast, MD  Skin  Protectants, Misc. (EUCERIN) cream Apply 1 application topically in the morning, at noon, and at bedtime. Apply to Trunk & extremities for dry skin   Yes [provider]  thiamine 100 MG tablet Place 1 tablet (100 mg total) into feeding tube daily. 07/11/21  Yes Russella Dar, NP  Water For Irrigation, Sterile (FREE WATER) SOLN Place 200 mLs into feeding tube every 4 (four) hours. 08/06/21  Yes Lanae Boast, MD  Emollient (LUBRIDERM SERIOUSLY SENSITIVE) LOTN Apply 1 application topically 2 (two) times daily. Patient not taking: No sig reported 08/06/21   Lanae Boast, MD  Nutritional Supplements (FEEDING SUPPLEMENT, PROSOURCE TF,) liquid Place 45 mLs into feeding tube 3 (three) times daily. Patient not taking: No sig reported 08/06/21   Lanae Boast, MD  oxyCODONE (OXY IR/ROXICODONE) 5 MG immediate release tablet Take 1 tablet (5 mg total) by mouth 2 (two) times daily as needed for up to 4 doses for breakthrough pain or severe pain. 08/06/21   Lanae Boast, MD    Inpatient Medications: Scheduled Meds:  vitamin C  500 mg Per Tube Daily  aspirin  81 mg Per Tube Daily   atorvastatin  40 mg Per Tube q1800   chlorhexidine  15 mL Mouth Rinse BID   Chlorhexidine Gluconate Cloth  6 each Topical Daily   enoxaparin (LOVENOX) injection  40 mg Subcutaneous Q24H   feeding supplement (PROSource TF)  45 mL Per Tube Daily   free water  100 mL Per Tube Q6H   hydrocerin   Topical BID   levothyroxine  100 mcg Per Tube Q0600   mouth rinse  15 mL Mouth Rinse q12n4p   midodrine  5 mg Per Tube TID WC   nutrition supplement (JUVEN)  1 packet Per Tube BID BM   QUEtiapine  25 mg Per Tube QHS   sodium chloride flush  10-40 mL Intracatheter Q12H   tamsulosin  0.4 mg Oral Daily   thiamine injection  100 mg Intravenous Daily   Continuous Infusions:  ampicillin-sulbactam (UNASYN) IV 3 g (09/12/21 1015)   feeding supplement (JEVITY 1.5 CAL/FIBER) 1,500 mL (09/12/21 1017)   PRN Meds: acetaminophen, diphenhydrAMINE,  docusate, influenza vaccine adjuvanted, melatonin, pneumococcal 23 valent vaccine, polyethylene glycol, sodium chloride flush  Allergies:   No Known Allergies  Social History:   Social History   Socioeconomic History   Marital status: Married    Spouse name: Not on file   Number of children: Not on file   Years of education: Not on file   Highest education level: Not on file  Occupational History   Not on file  Tobacco Use   Smoking status: Unknown   Smokeless tobacco: Not on file  Substance and Sexual Activity   Alcohol use: Not on file   Drug use: Not on file   Sexual activity: Not on file  Other Topics Concern   Not on file  Social History Narrative   Not on file   Social Determinants of Health   Financial Resource Strain: Not on file  Food Insecurity: Not on file  Transportation Needs: Not on file  Physical Activity: Not on file  Stress: Not on file  Social Connections: Not on file  Intimate Partner Violence: Not on file    Family History:   No family history on file.   ROS:  Please see the history of present illness.   All other ROS reviewed and negative.     Physical Exam/Data:   Vitals:   09/11/21 2000 09/12/21 0000 09/12/21 0400 09/12/21 0800  BP:  126/70 125/68 (!) 131/58  Pulse:    96  Resp: 14   15  Temp: 98.6 F (37 C) 98.9 F (37.2 C) 100 F (37.8 C) 98.6 F (37 C)  TempSrc: Axillary Oral Axillary Axillary  SpO2:    100%  Weight:      Height:        Intake/Output Summary (Last 24 hours) at 09/12/2021 1500 Last data filed at 09/12/2021 0540 Gross per 24 hour  Intake 800 ml  Output 2800 ml  Net -2000 ml   Last 3 Weights 09/05/2021 09/04/2021 09/02/2021  Weight (lbs) 171 lb 1.2 oz 165 lb 5.5 oz 171 lb 15.3 oz  Weight (kg) 77.6 kg 75 kg 78 kg     Body mass index is 27.61 kg/m.  General:  wearing mitten, alert and oriented x 1 HEENT: normal Neck: no JVD Vascular: No carotid bruits; Distal pulses 2+ bilaterally Cardiac:  normal S1,  S2; RRR; no murmur  Lungs:  clear to auscultation bilaterally, no wheezing, rhonchi or rales  Abd: soft,  nontender, no hepatomegaly  Ext: no edema Musculoskeletal:  No deformities, BUE and BLE strength normal and equal Skin: warm and dry  Neuro:  CNs 2-12 intact, no focal abnormalities noted Psych:  Normal affect   EKG:  The EKG was personally reviewed and demonstrates:  NSR without significant ST-T wave changes Telemetry:  Telemetry was personally reviewed and demonstrates:  NSR without significant ventricular ectopy  Relevant CV Studies:  Echo 07/21/2021  1. Left ventricular ejection fraction, by estimation, is 60 to 65%. The  left ventricle has normal function. The left ventricle has no regional  wall motion abnormalities. There is mild left ventricular hypertrophy of  the basal-septal segment. Left  ventricular diastolic parameters are indeterminate.   2. Right ventricular systolic function is normal. The right ventricular  size is normal. There is mildly elevated pulmonary artery systolic  pressure.   3. The mitral valve is normal in structure. Mild mitral valve  regurgitation. No evidence of mitral stenosis.   4. The aortic valve is tricuspid. Aortic valve regurgitation is trivial.  No aortic stenosis is present.   5. The inferior vena cava is normal in size with greater than 50%  respiratory variability, suggesting right atrial pressure of 3 mmHg.    Limited echo 08/28/2021 1. Left ventricular ejection fraction, by estimation, is 40 to 45%. The  left ventricle has mildly decreased function. The left ventricle  demonstrates regional wall motion abnormalities (see scoring  diagram/findings for description). There is mild  concentric left ventricular hypertrophy. There is severe  hypokinesis/akinesis of the left ventricular, apical inferoseptal wall,  inferior wall and apical segment.   2. Right ventricular systolic function is normal. The right ventricular  size is normal.    3. Left atrial size was moderately dilated.   4. Mobile structure is seen, probably tip of a venous catheter.   5. Large pleural effusion in the left lateral region.   6. The mitral valve is normal in structure. Mild mitral valve  regurgitation.   7. Tricuspid valve regurgitation is mild to moderate.   8. The aortic valve is tricuspid. Aortic valve regurgitation is mild to  moderate. No aortic stenosis is present.   Comparison(s): Prior images reviewed side by side. The left ventricular  function is significantly worse. The left ventricular wall motion  abnormality is new.   Laboratory Data:  High Sensitivity Troponin:   Recent Labs  Lab 08/28/21 0647 08/28/21 0835  TROPONINIHS 11 15     Chemistry Recent Labs  Lab 09/08/21 0329 09/08/21 0330 09/09/21 0409 09/11/21 0341 09/12/21 0242  NA  --    < > 137 141 144  K  --    < > 3.7 3.9 4.0  CL  --    < > 103 108 111  CO2  --    < > 28 25 25   GLUCOSE  --    < > 106* 87 82  BUN  --    < > 24* 40* 52*  CREATININE  --    < > 0.69 0.72 0.81  CALCIUM  --    < > 8.6* 8.9 9.2  MG 1.5*  --  1.9 1.6*  --   GFRNONAA  --    < > >60 >60 >60  ANIONGAP  --    < > 6 8 8    < > = values in this interval not displayed.    Recent Labs  Lab 09/08/21 0330 09/09/21 0409 09/11/21 0341  ALBUMIN 2.1* 2.1* 2.4*  Lipids No results for input(s): CHOL, TRIG, HDL, LABVLDL, LDLCALC, CHOLHDL in the last 168 hours.  Hematology Recent Labs  Lab 09/09/21 0409 09/11/21 0341 09/12/21 0242  WBC 8.0 6.2 7.9  RBC 2.45* 2.40* 2.48*  HGB 7.8* 7.8* 7.8*  HCT 25.4* 24.7* 26.2*  MCV 103.7* 102.9* 105.6*  MCH 31.8 32.5 31.5  MCHC 30.7 31.6 29.8*  RDW 18.3* 18.3* 18.6*  PLT 149* 158 190   Thyroid  Recent Labs  Lab 09/09/21 0409  TSH 57.084*  FREET4 0.60*    BNPNo results for input(s): BNP, PROBNP in the last 168 hours.  DDimer No results for input(s): DDIMER in the last 168 hours.   Radiology/Studies:  DG CHEST PORT 1 VIEW  Result  Date: 09/10/2021 CLINICAL DATA:  PICC placement. EXAM: PORTABLE CHEST 1 VIEW COMPARISON:  Chest radiograph 08/28/2021 FINDINGS: Right upper extremity PICC tip is in the mid-lower SVC. The previous right internal jugular catheter is been removed. Interval extubation and removal of enteric tube. Mild cardiomegaly persists. Improved bilateral pleural effusions and airspace opacity. Minimal residual blunting of costophrenic angles. Surgical hardware in the lower cervical spine. IMPRESSION: 1. Tip of the right upper extremity PICC in the mid-lower SVC. 2. Improved bilateral pleural effusions and airspace opacity. Electronically Signed   By: Keith Rake M.D.   On: 09/10/2021 22:53     Assessment and Plan:   LV dysfunction  - although initial echo showed normal EF in Aug, most recent echo after respiratory arrest showed wall motion abnormality and mildly low EF - her mental status varies quite frequently, she was alert and oriented x 1 (self) however was able to carry a relatively normal conversation, however other times, she would become quite confused and violent.  - she is not a candidate for any invasive study. May consider repeat echo in a few month to see if EF improve.   Cardiac arrest: Felt to be due to aspiration  Confusion: CT image 08/28/2021 was difficult to exclude early loss of cerebral hemisphere gray-white differentiation, such as from anocix injury in the setting of cardiac arrest.   Recent discitis and osteomyelitis: Underwent I&D and seen by ID service and recommended antibiotic therapy.  Recent small pontine stroke: not on anticoagulation or antiplatelet due to thrombocytopenia  Septic shock: blood culture positive, on IV unasyn.   Daily alcohol intake: She reported 2-3 beers per night prior to admission.    Risk Assessment/Risk Scores:             For questions or updates, please contact [place] Please consult [email or web address] for contact info under     Signed, Almyra Deforest, [place]  09/12/2021 3:00 PM

## 2021-09-13 LAB — GLUCOSE, CAPILLARY
Glucose-Capillary: 83 mg/dL (ref 70–99)
Glucose-Capillary: 94 mg/dL (ref 70–99)
Glucose-Capillary: 78 mg/dL (ref 70–99)
Glucose-Capillary: 89 mg/dL (ref 70–99)
Glucose-Capillary: 85 mg/dL (ref 70–99)

## 2021-09-13 LAB — C-REACTIVE PROTEIN: CRP: 1.9 mg/dL — ABNORMAL HIGH (ref <1.0)

## 2021-09-13 LAB — SEDIMENTATION RATE: Sed Rate: 85 mm/hr — ABNORMAL HIGH (ref 0–22)

## 2021-09-13 MED ORDER — THIAMINE HCL 100 MG PO TABS
100.0000 mg | ORAL_TABLET | Freq: Every day | ORAL | Status: DC
  Administered 2021-09-14 – 2021-09-17 (×4): 100 mg
  Filled 2021-09-13 (×4): qty 1

## 2021-09-13 NOTE — Progress Notes (Signed)
TRH night cross cover note:  Has been affected by RN earlier in last evening with night shift regarding the patient's agitation, with associated attempts to climb out of bed, which was reportedly refractory to verbal redirection, and associated with request for consideration as needed pharmacologic intervention.  Consequently, I placed order for Ativan 0.5 mg IV every 6 hours prn for agitation.      Newton Pigg, DO Hospitalist

## 2021-09-13 NOTE — Plan of Care (Signed)
  Problem: Nutrition: Goal: Adequate nutrition will be maintained Outcome: Progressing   Problem: Elimination: Goal: Will not experience complications related to bowel motility Outcome: Progressing   Problem: Pain Managment: Goal: General experience of comfort will improve Outcome: Progressing   Problem: Skin Integrity: Goal: Risk for impaired skin integrity will decrease Outcome: Progressing   

## 2021-09-13 NOTE — TOC Progression Note (Signed)
Transition of Care [facility]) - Progression Note    Patient Details  Name: Kristy [name] MRN: [id number] Date of Birth: 10-11-1946  [facility] CM/SW Contact  Lorri Frederick, LCSW Phone Number: 09/13/2021, 3:33 PM  Clinical Narrative:  CSW attempted to set up hospital discharge appt at [facility] [place].  Pt has prior balance and cannot be scheduled until it is addressed.  Billing: [phone].  CSW spoke with pt daughter [name], shared this information.  Discussed that MD wanting to hold pt one more day due to agitation and [name] is aware, spoke to MD today.      Expected Discharge Plan:  (TBD-Home with palliative care potentially) Barriers to Discharge: Other (must enter comment) (family making arrangements for long term care at home)  Expected Discharge Plan and Services Expected Discharge Plan:  (TBD-Home with palliative care potentially)   Discharge Planning Services: CM Consult   Living arrangements for the past 2 months: Apartment                 DME Arranged: 3-N-1, Wheelchair manual, Hospital bed, Other see comment (hoyer lift)         HH Arranged: PT, OT, Nurse's Aide, Speech Therapy, RN   Date HH Agency Contacted: 09/11/21 Time HH Agency Contacted: [phone] Representative spoke with at [facility] Agency: Kandee Keen   Social Determinants of Health (SDOH) Interventions    Readmission Risk Interventions Readmission Risk Prevention Plan 09/13/2021  Transportation Screening Complete  PCP or Specialist appointment within 3-5 days of discharge Complete  HRI or Home Care Consult Complete  SW Recovery Care/Counseling Consult Complete  Palliative Care Screening Complete  Skilled Nursing Facility Complete  Some recent data might be hidden

## 2021-09-13 NOTE — Plan of Care (Signed)
  Problem: Education: Goal: Knowledge of General Education information will improve Description: Including pain rating scale, medication(s)/side effects and non-pharmacologic comfort measures Outcome: Progressing   Problem: Health Behavior/Discharge Planning: Goal: Ability to manage health-related needs will improve Outcome: Progressing   Problem: Clinical Measurements: Goal: Ability to maintain clinical measurements within normal limits will improve Outcome: Progressing   Problem: Coping: Goal: Level of anxiety will decrease Outcome: Progressing   Problem: Elimination: Goal: Will not experience complications related to bowel motility Outcome: Progressing   Problem: Pain Managment: Goal: General experience of comfort will improve Outcome: Progressing   

## 2021-09-14 ENCOUNTER — Inpatient Hospital Stay (HOSPITAL_COMMUNITY): Payer: Medicare Other

## 2021-09-14 DIAGNOSIS — M4620 Osteomyelitis of vertebra, site unspecified: Secondary | ICD-10-CM

## 2021-09-14 DIAGNOSIS — G9341 Metabolic encephalopathy: Secondary | ICD-10-CM

## 2021-09-14 DIAGNOSIS — J9691 Respiratory failure, unspecified with hypoxia: Secondary | ICD-10-CM

## 2021-09-14 DIAGNOSIS — J189 Pneumonia, unspecified organism: Secondary | ICD-10-CM

## 2021-09-14 DIAGNOSIS — I10 Essential (primary) hypertension: Secondary | ICD-10-CM

## 2021-09-14 DIAGNOSIS — I469 Cardiac arrest, cause unspecified: Secondary | ICD-10-CM

## 2021-09-14 DIAGNOSIS — E162 Hypoglycemia, unspecified: Secondary | ICD-10-CM

## 2021-09-14 DIAGNOSIS — A419 Sepsis, unspecified organism: Secondary | ICD-10-CM

## 2021-09-14 DIAGNOSIS — R6521 Severe sepsis with septic shock: Secondary | ICD-10-CM

## 2021-09-14 LAB — CBC
HCT: 21.2 % — ABNORMAL LOW (ref 36.0–46.0)
Hemoglobin: 6.3 g/dL — CL (ref 12.0–15.0)
MCH: 32.6 pg (ref 26.0–34.0)
MCHC: 29.7 g/dL — ABNORMAL LOW (ref 30.0–36.0)
MCV: 109.8 fL — ABNORMAL HIGH (ref 80.0–100.0)
Platelets: 171 10*3/uL (ref 150–400)
RBC: 1.93 MIL/uL — ABNORMAL LOW (ref 3.87–5.11)
RDW: 19.8 % — ABNORMAL HIGH (ref 11.5–15.5)
WBC: 6.3 10*3/uL (ref 4.0–10.5)
nRBC: 0 % (ref 0.0–0.2)

## 2021-09-14 LAB — BASIC METABOLIC PANEL
Anion gap: 9 (ref 5–15)
BUN: 68 mg/dL — ABNORMAL HIGH (ref 8–23)
CO2: 25 mmol/L (ref 22–32)
Calcium: 9.1 mg/dL (ref 8.9–10.3)
Chloride: 115 mmol/L — ABNORMAL HIGH (ref 98–111)
Creatinine, Ser: 0.94 mg/dL (ref 0.44–1.00)
GFR, Estimated: >60 mL/min (ref >60)
Glucose, Bld: 102 mg/dL — ABNORMAL HIGH (ref 70–99)
Potassium: 4.1 mmol/L (ref 3.5–5.1)
Sodium: 149 mmol/L — ABNORMAL HIGH (ref 135–145)

## 2021-09-14 LAB — GLUCOSE, CAPILLARY
Glucose-Capillary: 105 mg/dL — ABNORMAL HIGH (ref 70–99)
Glucose-Capillary: 118 mg/dL — ABNORMAL HIGH (ref 70–99)
Glucose-Capillary: 75 mg/dL (ref 70–99)
Glucose-Capillary: 89 mg/dL (ref 70–99)
Glucose-Capillary: 93 mg/dL (ref 70–99)
Glucose-Capillary: 94 mg/dL (ref 70–99)
Glucose-Capillary: 95 mg/dL (ref 70–99)

## 2021-09-14 LAB — PREPARE RBC (CROSSMATCH)

## 2021-09-14 IMAGING — DX DG CHEST 1V
1 series · 1 of 1 positions shown · non-contrast
Comparison: [DATE] chest radiograph.

CLINICAL DATA: Altered mental status, PICC

EXAM:
CHEST  1 VIEW

[chest ap]
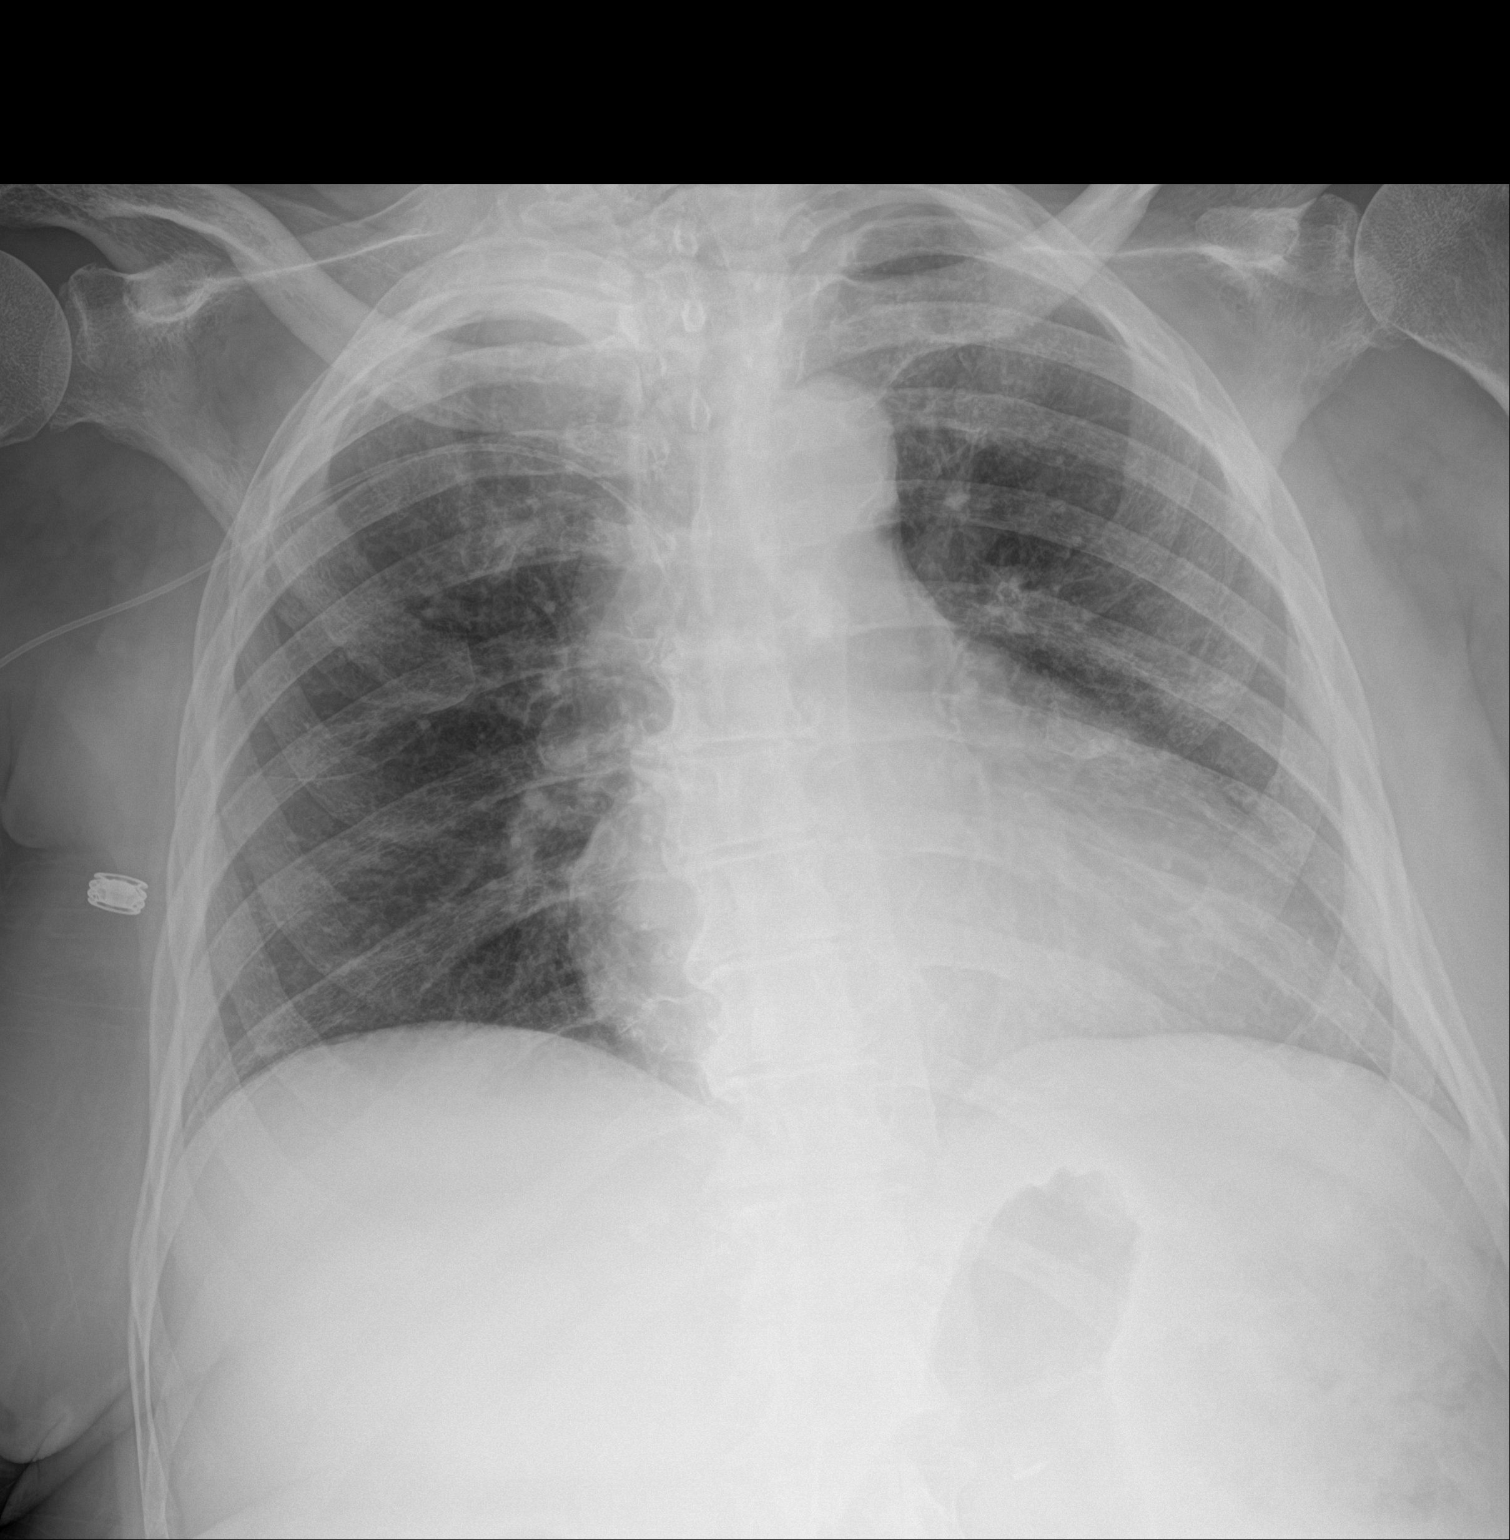

[1 of 1 positions shown; findings below may reference images not displayed]

FINDINGS: Right PICC terminates in the upper third of the SVC, with interval
retraction. Surgical hardware from ACDF in the lower cervical spine.
Stable cardiomediastinal silhouette with mild cardiomegaly. No
pneumothorax. No pleural effusion. No pulmonary edema. No acute
consolidative airspace disease.
IMPRESSION: 1. Right PICC terminates in the upper third of the SVC, with
interval retraction.
2. Stable mild cardiomegaly without pulmonary edema.

## 2021-09-14 MED ORDER — SODIUM CHLORIDE 0.9% IV SOLUTION: Freq: Once | INTRAVENOUS | Status: AC

## 2021-09-14 MED ORDER — FREE WATER
259.0000 mL | Freq: Four times a day (QID) | Status: DC
  Administered 2021-09-14 – 2021-09-15 (×4): 259 mL

## 2021-09-14 NOTE — Progress Notes (Signed)
Pt has a critical lab Hemo 6.3 Arrien, York Ram, MD paged by Loretha Stapler

## 2021-09-14 NOTE — Progress Notes (Signed)
PROGRESS NOTE    Kristy [name]  XIP:[id number] DOB: [birth year] DOA: 08/28/2021 PCP: Darrow Bussing, MD    Brief Narrative:  Kristy [name] was admitted to the hospital with the working diagnosis of acute hypoxemic respiratory failure, due to aspiration pneumonia, complicated with cardiac arrest and septic shock (present on admission).  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 In the emergency room she was found in respiratory distress, including encephalopathy and she and was intubated. While still in the ED she had cardiac arrest x2, requiring epinephrine and cardioversion.  On admission to the ICU her blood pressure was 195/138, HR 100, RR 57 and oxygenation 85%. Patient not responsive, lungs with bilateral rhonchi and thick secretions per ET tube, heart S1 and S2 present and rhythmic, abdomen soft and +2 lower extremity edema.   Na 137, K 5,7, Cl 105, bicarbonate 26, glucose 86, BUN 49 cr 1,14. Wbc 6,7, hgb 9,1, hct 31.3 and platelets 84.  SARS COVID 19 negative.   UA SG 1,020, 6-10 wbc, negative nitrates.   Head CT with no acute changes.  Neck CT with no acute changes.   Chest x ray with bilateral pleural effusions more left than right with almost complete opacification of left side. Chest CT with bilateral pleural effusion and bilateral lower lobes dense nodular infiltrates.   Patient was admitted to the ICU with the working diagnosis of septic shock. She was placed on vasopressors and broad spectrum antibiotic therapy.  Blood cultures positive for Acinetobacter.  Vasopressors were weaned off with good toleration.   Patient liberated from mechanical ventilation on 09/01/21.   Transfer to [facility] on 09/03/21.  Completed antibiotic therapy with Unasyn 14 days.  Patient with recurrent hypoglycemia.   Assessment & Plan:   Principal Problem:    Pneumonia Active Problems:   HTN (hypertension)   Vertebral osteomyelitis (HCC)   Protein-calorie malnutrition, severe   Pressure injury of skin   Severe sepsis with septic shock (HCC)   Respiratory failure with hypoxia (HCC)   Cardiac arrest (HCC)   Acute metabolic encephalopathy   Hypoglycemia   Acute hypoxemic respiratory failure due to pneumonia, Acinetobacter bacteremia. Resolved septic shock.  Patient with no dyspnea at rest, she is non ambulatory, her oxygenation is 100% on room air. Follow up chest film from today personally reviewed resolution of pleural effusions and infiltrates.   2. Acute metabolic encephalopathy. Patient continue to be very weak and deconditioned, no agitation but has mittens to protect lines and leads.  She has been scratching her neck.  Continue to be confused but not agitated.   Discontinue lorazepam, continue with quetiapine and thiamine.   Continue supportive medical care. Will need home health services.   3. AKI due to ATN, hypomagnesemia. Hypernatremia, hypokalemia Renal function with serum cr at 0,94, K is 4,1 and serum bicarbonate at 25. Na is 149.  Plan to increase free water flushes per peg and follow up on electrolytes in am.   4. Systolic hear failure No signs of acute decompensation. Her Left ventricle EF is 40 to 45%.  Continue with midodrine for blood pressure.   5. Acute on chronic anemia/. Sp 1 units PRBC transfusion on 10/21. Today hgb has dropped to 6.3, no signs of active bleeding. Plan to transfuse 1 unit PRBC.  6. Recurrent hypoglycemia. Severe protein calorie malnutrition. Sacrum and right  buttock pressure ulcer stage  2 present on admission Continue with local wound care and nutritional support.  Patient on a dysphagia diet.   7. CVA/ dyslipidemia patient is non ambulatory, swallow dysfunction.  Continue tube feedings per PEG tube, aspiration precautions.  Continue aspirin and atorvastatin.   8. Recent discitis  osteomyelitis at C 4-5, C 6-7 and possibly T1-2.  Has completed IV antibiotic therapy on 10/16, plan to continue with suppressive therapy with doxycyline for 30 days.   9. Hypothyroid. Continue with levothyroxine.     Patient continue to be at high risk for worsening anemia and hypernatremia.   Status is: Inpatient  Remains inpatient appropriate because:  PRBC transfusion, electrolyte correction.    DVT prophylaxis: Enoxaparin   Code Status:    full  Family Communication:    I was not able to reach her daughter over the phone, I left a message on her voicemail.     Nutrition Status: Nutrition Problem: Severe Malnutrition Etiology: acute illness (bacteremia) Signs/Symptoms: mild fat depletion, moderate muscle depletion, percent weight loss (11.7% weight loss in less than 2 months) Percent weight loss: 11.7 % Interventions: Tube feeding, Prostat     Skin Documentation: Pressure Injury 08/28/21 Coccyx Right Stage 2 -  Partial thickness loss of dermis presenting as a shallow open injury with a red, pink wound bed without slough. (Active)  08/28/21 1030  Location: Coccyx  Location Orientation: Right  Staging: Stage 2 -  Partial thickness loss of dermis presenting as a shallow open injury with a red, pink wound bed without slough.  Wound Description (Comments):   Present on Admission: Yes     Pressure Injury 08/28/21 Sacrum Medial Stage 2 -  Partial thickness loss of dermis presenting as a shallow open injury with a red, pink wound bed without slough. (Active)  08/28/21 1030  Location: Sacrum  Location Orientation: Medial  Staging: Stage 2 -  Partial thickness loss of dermis presenting as a shallow open injury with a red, pink wound bed without slough.  Wound Description (Comments):   Present on Admission:      Pressure Injury 08/28/21 Buttocks Right Stage 2 -  Partial thickness loss of dermis presenting as a shallow open injury with a red, pink wound bed without slough.  (Active)  08/28/21 1030  Location: Buttocks  Location Orientation: Right  Staging: Stage 2 -  Partial thickness loss of dermis presenting as a shallow open injury with a red, pink wound bed without slough.  Wound Description (Comments):   Present on Admission:       Subjective: Patient with positive confusion but not agitation, denies any dyspnea or chest pain, no nausea or vomiting and tolerating tube feedings. She has been scratching her neck. Now has mittens in place,   Objective: Vitals:   09/13/21 2000 09/14/21 0000 09/14/21 0400 09/14/21 0753  BP:    117/61  Pulse:    95  Resp:  16 16 18   Temp: 98.1 F (36.7 C) 98.1 F (36.7 C) (!) 97 F (36.1 C) 97.6 F (36.4 C)  TempSrc: Oral Axillary Axillary Axillary  SpO2:    100%  Weight:      Height:        Intake/Output Summary (Last 24 hours) at 09/14/2021 0830 Last data filed at 09/14/2021 0646 Gross per 24 hour  Intake 2520 ml  Output 600 ml  Net 1920 ml   Filed Weights   09/02/21 0450 09/04/21 0412 09/05/21 0816  Weight: 78 kg 75 kg 77.6 kg  Examination:   General: Not in pain or dyspnea, deconditioned  Neurology: Awake and alert, non focal, confused but not agitated.  E ENT: no pallor, no icterus, oral mucosa moist Cardiovascular: No JVD. S1-S2 present, rhythmic, no gallops, rubs, or murmurs. Trace bilateral non pitting lower extremity edema. Pulmonary: positive breath sounds bilaterally, adequate air movement, no wheezing, rhonchi or rales. Gastrointestinal. Abdomen soft and non tender Skin. No rashes Musculoskeletal: no joint deformities     Data Reviewed: I have personally reviewed following labs and imaging studies  CBC: Recent Labs  Lab 09/08/21 0329 09/09/21 0409 09/11/21 0341 09/12/21 0242 09/14/21 0734  WBC 9.0 8.0 6.2 7.9 6.3  HGB 7.8* 7.8* 7.8* 7.8* 6.3*  HCT 25.9* 25.4* 24.7* 26.2* 21.2*  MCV 105.7* 103.7* 102.9* 105.6* 109.8*  PLT 146* 149* 158 190 [id number]   Basic Metabolic  Panel: Recent Labs  Lab 09/07/21 1408 09/08/21 0329 09/08/21 0330 09/09/21 0409 09/11/21 0341 09/12/21 0242  NA 141  --  141 137 141 144  K  --   --  3.6 3.7 3.9 4.0  CL  --   --  106 103 108 111  CO2  --   --  28 28 25 25   GLUCOSE  --   --  97 106* 87 82  BUN  --   --  27* 24* 40* 52*  CREATININE  --   --  0.69 0.69 0.72 0.81  CALCIUM  --   --  8.7* 8.6* 8.9 9.2  MG  --  1.5*  --  1.9 1.6*  --   PHOS  --   --  3.5 3.6 3.5  --    GFR: Estimated Creatinine Clearance: 63.1 mL/min (by C-G formula based on SCr of 0.81 mg/dL). Liver Function Tests: Recent Labs  Lab 09/08/21 0330 09/09/21 0409 09/11/21 0341  ALBUMIN 2.1* 2.1* 2.4*   No results for input(s): LIPASE, AMYLASE in the last 168 hours. No results for input(s): AMMONIA in the last 168 hours. Coagulation Profile: No results for input(s): INR, PROTIME in the last 168 hours. Cardiac Enzymes: Recent Labs  Lab 09/08/21 0329 09/09/21 0409  CKTOTAL 30* 29*   BNP (last 3 results) No results for input(s): PROBNP in the last 8760 hours. HbA1C: No results for input(s): HGBA1C in the last 72 hours. CBG: Recent Labs  Lab 09/13/21 1543 09/13/21 2014 09/14/21 0017 09/14/21 0422 09/14/21 0751  GLUCAP 89 85 95 118* 94   Lipid Profile: No results for input(s): CHOL, HDL, LDLCALC, TRIG, CHOLHDL, LDLDIRECT in the last 72 hours. Thyroid Function Tests: No results for input(s): TSH, T4TOTAL, FREET4, T3FREE, THYROIDAB in the last 72 hours. Anemia Panel: No results for input(s): VITAMINB12, FOLATE, FERRITIN, TIBC, IRON, RETICCTPCT in the last 72 hours.    Radiology Studies: I have reviewed all of the imaging during this hospital visit personally     Scheduled Meds:  vitamin C  500 mg Per Tube Daily   aspirin  81 mg Per Tube Daily   atorvastatin  40 mg Per Tube q1800   chlorhexidine  15 mL Mouth Rinse BID   Chlorhexidine Gluconate Cloth  6 each Topical Daily   doxycycline  100 mg Per Tube BID   enoxaparin  (LOVENOX) injection  40 mg Subcutaneous Q24H   feeding supplement (PROSource TF)  45 mL Per Tube Daily   free water  100 mL Per Tube Q6H   hydrocerin   Topical BID   levothyroxine  100 mcg Per Tube [id number]  mouth rinse  15 mL Mouth Rinse q12n4p   midodrine  5 mg Per Tube TID WC   nutrition supplement (JUVEN)  1 packet Per Tube BID BM   QUEtiapine  25 mg Per Tube QHS   sodium chloride flush  10-40 mL Intracatheter Q12H   tamsulosin  0.4 mg Oral Daily   thiamine  100 mg Per Tube Daily   Continuous Infusions:  feeding supplement (JEVITY 1.5 CAL/FIBER) 1,500 mL (09/13/21 1600)     LOS: 17 days        Thaniel Coluccio Gerome Apley, MD

## 2021-09-15 LAB — TYPE AND SCREEN
ABO/RH(D): B NEG
Antibody Screen: NEGATIVE
Unit division: 0

## 2021-09-15 LAB — GLUCOSE, CAPILLARY
Glucose-Capillary: 62 mg/dL — ABNORMAL LOW (ref 70–99)
Glucose-Capillary: 79 mg/dL (ref 70–99)
Glucose-Capillary: 78 mg/dL (ref 70–99)
Glucose-Capillary: 77 mg/dL (ref 70–99)
Glucose-Capillary: 80 mg/dL (ref 70–99)

## 2021-09-15 LAB — HEMOGLOBIN AND HEMATOCRIT, BLOOD
HCT: 25.8 % — ABNORMAL LOW (ref 36.0–46.0)
Hemoglobin: 7.8 g/dL — ABNORMAL LOW (ref 12.0–15.0)

## 2021-09-15 LAB — BASIC METABOLIC PANEL
Anion gap: 8 (ref 5–15)
BUN: 66 mg/dL — ABNORMAL HIGH (ref 8–23)
CO2: 25 mmol/L (ref 22–32)
Calcium: 9 mg/dL (ref 8.9–10.3)
Chloride: 113 mmol/L — ABNORMAL HIGH (ref 98–111)
Creatinine, Ser: 0.71 mg/dL (ref 0.44–1.00)
GFR, Estimated: >60 mL/min (ref >60)
Glucose, Bld: 75 mg/dL (ref 70–99)
Potassium: 4.1 mmol/L (ref 3.5–5.1)
Sodium: 146 mmol/L — ABNORMAL HIGH (ref 135–145)

## 2021-09-15 LAB — BPAM RBC
Blood Product Expiration Date: 202211172359
ISSUE DATE / TIME: 202211051412
Unit Type and Rh: 1700

## 2021-09-15 LAB — MAGNESIUM: Magnesium: 2.1 mg/dL (ref 1.7–2.4)

## 2021-09-15 MED ORDER — FREE WATER
300.0000 mL | Freq: Four times a day (QID) | Status: DC
Start: 2021-09-15 — End: 2021-09-16
  Administered 2021-09-15 – 2021-09-16 (×5): 300 mL

## 2021-09-15 NOTE — Progress Notes (Signed)
PROGRESS NOTE    Kristy [name]  OZD:[id number] DOB: 1946-07-15 DOA: 08/28/2021 PCP: Darrow Bussing, MD    Brief Narrative:  Kristy [name] was admitted to the hospital with the working diagnosis of acute hypoxemic respiratory failure, due to aspiration pneumonia, complicated with cardiac arrest and septic shock (present on admission).   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 In the emergency room she was found in respiratory distress, including encephalopathy and she and was intubated. While still in the ED she had cardiac arrest x2, requiring epinephrine and cardioversion.  On admission to the ICU her blood pressure was 195/138, HR 100, RR 57 and oxygenation 85%. Patient not responsive, lungs with bilateral rhonchi and thick secretions per ET tube, heart S1 and S2 present and rhythmic, abdomen soft and +2 lower extremity edema.    Na 137, K 5,7, Cl 105, bicarbonate 26, glucose 86, BUN 49 cr 1,14. Wbc 6,7, hgb 9,1, hct 31.3 and platelets 84.  SARS COVID 19 negative.    UA SG 1,020, 6-10 wbc, negative nitrates.    Head CT with no acute changes.  Neck CT with no acute changes.    Chest x ray with bilateral pleural effusions more left than right with almost complete opacification of left side. Chest CT with bilateral pleural effusion and bilateral lower lobes dense nodular infiltrates.    Patient was admitted to the ICU with the working diagnosis of septic shock. She was placed on vasopressors and broad spectrum antibiotic therapy.  Blood cultures positive for Acinetobacter.  Vasopressors were weaned off with good toleration.    Patient liberated from mechanical ventilation on 09/01/21.    Transfer to [facility] on 09/03/21.  Completed antibiotic therapy with Unasyn 14 days.  Patient with recurrent hypoglycemia.    Assessment & Plan:   Principal  Problem:   Pneumonia Active Problems:   HTN (hypertension)   Vertebral osteomyelitis (HCC)   Protein-calorie malnutrition, severe   Pressure injury of skin   Severe sepsis with septic shock (HCC)   Respiratory failure with hypoxia (HCC)   Cardiac arrest (HCC)   Acute metabolic encephalopathy   Hypoglycemia       Acute hypoxemic respiratory failure due to pneumonia, Acinetobacter bacteremia. Resolved septic shock.  Patient continue on room air, denies, dyspnea, continue to be very weak and deconditioned.  Continue tube feedings with aspiration precautions. Her daughter wants to take her home when medically ready.    2. Acute metabolic encephalopathy.  Patient very weak and deconditioned. She responds to questions and follows commands.  On quetiapine and thiamine.  Continue nutrition with tube feedings.     3. AKI due to ATN, hypomagnesemia. Hypernatremia, hypokalemia Renal function stable, free water flushes have been increased with Na now trending down to 146, with Cl 113 and serum bicarbonate at 25.  Will increase free water flushes to 300 cc q 6 hrs and follow up with electrolytes in am.    4. Systolic hear failure No signs of acute decompensation. Her Left ventricle EF is 40 to 45%.  Her blood pressure has been stable with systolic 105 to 135 mmHg. Will discontinue midodrine and will continue close blood pressure monitoring.    5. Acute on chronic anemia/.  Sp 2 units PRBC transfusion. Patient with no signs of bleeding, likely multifactorial anemia related to acute illness.   Follow up on Hgb and  Hct today.    6. Recurrent hypoglycemia. Severe protein calorie malnutrition. Sacrum and right  buttock pressure ulcer stage 2 present on admission Glucose has been more stable, capillary 62, 79, 78.  Fasting glucose 75 mg/dl.     7. CVA/ dyslipidemia patient is non ambulatory, swallow dysfunction.  Continue tube feedings per PEG tube, aspiration precautions.  On aspirin  and atorvastatin.   Reconsult PT, her daughter wants to know if patient can be transferred to wheelchair   8. Recent discitis osteomyelitis at C 4-5, C 6-7 and possibly T1-2.  Has completed IV antibiotic therapy on 10/16, plan to continue with suppressive therapy with doxycyline for 30 days.   9. Hypothyroid. On levothyroxine.    Status is: Inpatient  Remains inpatient appropriate because: H&h monitoring. Plan for discharge home with her daughter tomorrow.    DVT prophylaxis: Enoxaparin  Code Status:    full  Family Communication:   I spoke with patient's daughter at the bedside, we talked in detail about patient's condition, plan of care and prognosis and all questions were addressed.      Nutrition Status: Nutrition Problem: Severe Malnutrition Etiology: acute illness (bacteremia) Signs/Symptoms: mild fat depletion, moderate muscle depletion, percent weight loss (11.7% weight loss in less than 2 months) Percent weight loss: 11.7 % Interventions: Tube feeding, Prostat     Skin Documentation: Pressure Injury 08/28/21 Coccyx Right Stage 2 -  Partial thickness loss of dermis presenting as a shallow open injury with a red, pink wound bed without slough. (Active)  08/28/21 1030  Location: Coccyx  Location Orientation: Right  Staging: Stage 2 -  Partial thickness loss of dermis presenting as a shallow open injury with a red, pink wound bed without slough.  Wound Description (Comments):   Present on Admission: Yes     Pressure Injury 08/28/21 Sacrum Medial Stage 2 -  Partial thickness loss of dermis presenting as a shallow open injury with a red, pink wound bed without slough. (Active)  08/28/21 1030  Location: Sacrum  Location Orientation: Medial  Staging: Stage 2 -  Partial thickness loss of dermis presenting as a shallow open injury with a red, pink wound bed without slough.  Wound Description (Comments):   Present on Admission:      Pressure Injury 08/28/21 Buttocks Right  Stage 2 -  Partial thickness loss of dermis presenting as a shallow open injury with a red, pink wound bed without slough. (Active)  08/28/21 1030  Location: Buttocks  Location Orientation: Right  Staging: Stage 2 -  Partial thickness loss of dermis presenting as a shallow open injury with a red, pink wound bed without slough.  Wound Description (Comments):   Present on Admission:       Subjective: Patient is very weal and deconditioned, no nausea or vomiting, no apparent dyspnea or chest pain, she is toleraing well tube feedings.   Objective: Vitals:   09/14/21 1428 09/14/21 1438 09/14/21 1700 09/14/21 2114  BP: (!) 107/96 113/71 112/67 135/64  Pulse: (!) 107 93 92   Resp: 20 19 20    Temp: 98.2 F (36.8 C) 98.4 F (36.9 C) 98.4 F (36.9 C)   TempSrc: Oral Oral Oral   SpO2: 100%     Weight:      Height:        Intake/Output Summary (Last 24 hours) at 09/15/2021 1013 Last data filed at 09/15/2021 [id number] Gross per 24 hour  Intake 1620 ml  Output 600 ml  Net 1020 ml  Filed Weights   09/02/21 0450 09/04/21 0412 09/05/21 0816  Weight: 78 kg 75 kg 77.6 kg    Examination:   General: Not in pain or dyspnea  Neurology: Awake and alert, non focal. Confused but not agitated.  E ENT: mild pallor, no icterus, oral mucosa moist Cardiovascular: No JVD. S1-S2 present, rhythmic, no gallops, rubs, or murmurs. Trace bilateral lower extremity edema. Pulmonary: positive breath sounds bilaterally, adequate air movement, no wheezing, rhonchi or rales. Gastrointestinal. Abdomen soft and non tender Skin. No rashes Musculoskeletal: no joint deformities     Data Reviewed: I have personally reviewed following labs and imaging studies  CBC: Recent Labs  Lab 09/09/21 0409 09/11/21 0341 09/12/21 0242 09/14/21 0734  WBC 8.0 6.2 7.9 6.3  HGB 7.8* 7.8* 7.8* 6.3*  HCT 25.4* 24.7* 26.2* 21.2*  MCV 103.7* 102.9* 105.6* 109.8*  PLT 149* 158 190 [id number]   Basic Metabolic Panel: Recent Labs   Lab 09/09/21 0409 09/11/21 0341 09/12/21 0242 09/14/21 0734 09/15/21 0354  NA 137 141 144 149* 146*  K 3.7 3.9 4.0 4.1 4.1  CL 103 108 111 115* 113*  CO2 28 25 25 25 25   GLUCOSE 106* 87 82 102* 75  BUN 24* 40* 52* 68* 66*  CREATININE 0.69 0.72 0.81 0.94 0.71  CALCIUM 8.6* 8.9 9.2 9.1 9.0  MG 1.9 1.6*  --   --  2.1  PHOS 3.6 3.5  --   --   --    GFR: Estimated Creatinine Clearance: 63.9 mL/min (by C-G formula based on SCr of 0.71 mg/dL). Liver Function Tests: Recent Labs  Lab 09/09/21 0409 09/11/21 0341  ALBUMIN 2.1* 2.4*   No results for input(s): LIPASE, AMYLASE in the last 168 hours. No results for input(s): AMMONIA in the last 168 hours. Coagulation Profile: No results for input(s): INR, PROTIME in the last 168 hours. Cardiac Enzymes: Recent Labs  Lab 09/09/21 0409  CKTOTAL 29*   BNP (last 3 results) No results for input(s): PROBNP in the last 8760 hours. HbA1C: No results for input(s): HGBA1C in the last 72 hours. CBG: Recent Labs  Lab 09/14/21 1924 09/14/21 2348 09/15/21 0406 09/15/21 0539 09/15/21 0749  GLUCAP 75 105* 62* 79 78   Lipid Profile: No results for input(s): CHOL, HDL, LDLCALC, TRIG, CHOLHDL, LDLDIRECT in the last 72 hours. Thyroid Function Tests: No results for input(s): TSH, T4TOTAL, FREET4, T3FREE, THYROIDAB in the last 72 hours. Anemia Panel: No results for input(s): VITAMINB12, FOLATE, FERRITIN, TIBC, IRON, RETICCTPCT in the last 72 hours.    Radiology Studies: I have reviewed all of the imaging during this hospital visit personally     Scheduled Meds:  vitamin C  500 mg Per Tube Daily   aspirin  81 mg Per Tube Daily   atorvastatin  40 mg Per Tube q1800   chlorhexidine  15 mL Mouth Rinse BID   Chlorhexidine Gluconate Cloth  6 each Topical Daily   doxycycline  100 mg Per Tube BID   enoxaparin (LOVENOX) injection  40 mg Subcutaneous Q24H   feeding supplement (PROSource TF)  45 mL Per Tube Daily   free water  259 mL Per  Tube Q6H   hydrocerin   Topical BID   levothyroxine  100 mcg Per Tube Q0600   mouth rinse  15 mL Mouth Rinse q12n4p   midodrine  5 mg Per Tube TID WC   nutrition supplement (JUVEN)  1 packet Per Tube BID BM   QUEtiapine  25 mg Per Tube  QHS   sodium chloride flush  10-40 mL Intracatheter Q12H   tamsulosin  0.4 mg Oral Daily   thiamine  100 mg Per Tube Daily   Continuous Infusions:  feeding supplement (JEVITY 1.5 CAL/FIBER) 1,500 mL (09/15/21 0624)     LOS: 18 days        Ceniya Fowers Gerome Apley, MD

## 2021-09-15 NOTE — Plan of Care (Signed)

## 2021-09-15 NOTE — Evaluation (Signed)
Physical Therapy Evaluation Patient Details Name: Kristy [name] MRN: [id number] DOB: October 13, 1946 Today's Date: 09/15/2021  History of Present Illness  75 y.o. woman who presented to the ED from SNF 9/11 due to increased confusion and SOB. MRI (+) pontine infarct. Concern for Pneumonia and probablye CAUTI. 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 PMH: HTN, polycthemia, alcohol use.  Clinical Impression  Pt presents to PT with deficits in ROM, strength, power, functional mobility, balance, cognition. Per chart review pt has been totalA for most mobility and ADLs since hospital admission in August of 2022. During today's session the patient demonstrates the ability to initiate bed mobility and can maintain sitting balance with supervision. Pt requires significant physical assistance to transfer from bed to drop arm recliner.  Per MD note pt's daughter requesting PT re-eval to assess pt ability to transfer into wheelchair. Pt is able to transfer from bed to recliner however PT recommends use of hoyer lift at home to reduce risk of injury to patient and caregiver. Pt demonstrates the ability to maintain sitting balance, however does tend to slide into sacral sitting once provided with back support. Pt will require assistance to scoot back in wheelchair or recliner at home and will benefit from intermittent supervision when in chair or recliner. PT recommends the pt only be left in wheelchair in legs are on  legs rests to aide in reducing risk of sliding anteriorly out of wheelchair. Pt will benefit from continue acute PT services during this admission to continue improving lateral transfer quality, however PT does not anticipate large functional gains due to the chronic nature of the patient's mobility deficits. PT recommends home health PT to allow for further education on DME and to provide more transfer training if this aligns with patient and family goals.     Recommendations for follow up therapy are one component of a multi-disciplinary discharge planning process, led by the attending physician.  Recommendations may be updated based on patient status, additional functional criteria and insurance authorization.  Follow Up Recommendations Home health PT (HHPT for further education on equipment and DME)    Assistance Recommended at Discharge Frequent or constant Supervision/Assistance  Functional Status Assessment Patient has not had a recent decline in their functional status  Equipment Recommendations  Wheelchair (measurements PT);Hospital bed;3in1 (PT) (hoyer lift, all equipment already delivered at home per chart review)    Recommendations for Other Services       Precautions / Restrictions Precautions Precautions: Fall Precaution Comments: bil mittens, peg Restrictions Weight Bearing Restrictions: No      Mobility  Bed Mobility Overal bed mobility: Needs Assistance Bed Mobility: Rolling;Supine to Sit Rolling: Mod assist   Supine to sit: Max assist;HOB elevated          Transfers Overall transfer level: Needs assistance Equipment used: 1 person hand held assist Transfers: Lateral/Scoot Transfers            Lateral/Scoot Transfers: Total assist      Ambulation/Gait  Stairs            Wheelchair Mobility    Modified Rankin (Stroke Patients Only) Modified Rankin (Stroke Patients Only) Pre-Morbid Rankin Score: Severe  disability Modified Rankin: Severe disability     Balance Overall balance assessment: Needs assistance Sitting-balance support: Single extremity supported;No upper extremity supported;Feet supported Sitting balance-Leahy Scale: Fair Sitting balance - Comments: close supervision                                     Pertinent Vitals/Pain Pain Assessment: Faces Faces Pain Scale: Hurts little more Pain Location: pt reporting itching on neck, back, face Pain Descriptors / Indicators: Discomfort Pain Intervention(s): Monitored during session    Home Living Family/patient expects to be discharged to:: Private residence Living Arrangements: Children Available Help at Discharge: Family;Available 24 hours/day             Home Equipment: Hospital bed;Wheelchair - manual;BSC (hoyer lift) Additional Comments: D/C plan obtained from [facility] team notes in chart. Plan to D/C home with daughter    Prior Function Prior Level of Function : Needs assist  Cognitive Assist : ADLs (cognitive);Mobility (cognitive)           Mobility Comments: per chart review pt had been out of bed to recliner while at SNF, pt reports prior to fall in August she was ambulating independently. ADLs Comments: daughter reports patient requires assist for all ADls     Hand Dominance   Dominant Hand: Right    Extremity/Trunk Assessment   Upper Extremity Assessment Upper Extremity Assessment: RUE deficits/detail RUE Deficits / Details: pt with limited elbow extension, observed at least 50 degrees extension during session    Lower Extremity Assessment Lower Extremity Assessment: Generalized weakness RLE Deficits / Details: pt lacking ~5-10 degrees active knee extension LLE Deficits / Details: pt lacking ~5-10 degrees active knee extension    Cervical / Trunk Assessment Cervical / Trunk Assessment: Kyphotic  Communication   Communication: No difficulties  Cognition Arousal/Alertness:  Awake/alert Behavior During Therapy: WFL for tasks assessed/performed Overall Cognitive Status: Impaired/Different from baseline Area of Impairment: Orientation;Attention;Memory;Following commands;Safety/judgement;Awareness;Problem solving                 Orientation Level: Disoriented to;Time;Situation Current Attention Level: Focused Memory: Decreased short-term memory Following Commands: Follows one step commands consistently Safety/Judgement: Decreased awareness of safety;Decreased awareness of deficits Awareness: Intellectual Problem Solving: Slow processing          General Comments General comments (skin integrity, edema, etc.): VSS on RA    Exercises     Assessment/Plan    PT Assessment Patient needs continued PT services  PT Problem List Decreased strength;Decreased range of motion;Decreased activity tolerance;Decreased balance;Decreased mobility;Decreased cognition;Decreased safety awareness;Decreased knowledge of precautions       PT Treatment Interventions DME instruction;Functional mobility training;Therapeutic activities;Balance training;Therapeutic exercise;Neuromuscular re-education;Cognitive remediation;Patient/family education;Wheelchair mobility training    PT Goals (Current goals can be found in the Care Plan section)  Acute Rehab PT Goals Patient Stated Goal: to improve transfer quality PT Goal Formulation: With family (family goal per recent MD note) Time For Goal Achievement: 09/29/21 Potential to Achieve Goals: Poor    Frequency Min 2X/week   Barriers to discharge        Co-evaluation               AM-PAC PT "6 Clicks" Mobility  Outcome Measure Help needed turning from your back  to your side while in a flat bed without using bedrails?: A Lot Help needed moving from lying on your back to sitting on the side of a flat bed without using bedrails?: A Lot Help needed moving to and from a bed to a chair (including a wheelchair)?:  Total Help needed standing up from a chair using your arms (e.g., wheelchair or bedside chair)?: Total Help needed to walk in hospital room?: Total Help needed climbing 3-5 steps with a railing? : Total 6 Click Score: 8    End of Session   Activity Tolerance: Patient tolerated treatment well Patient left: in bed;with call bell/phone within reach;with bed alarm set Nurse Communication: Mobility status;Need for lift equipment PT Visit Diagnosis: Muscle weakness (generalized) (M62.81)    Time: 4656-8127 PT Time Calculation (min) (ACUTE ONLY): 32 min   Charges:              Arlyss Gandy, PT, DPT Acute Rehabilitation Pager: [phone] Office [phone]   Arlyss Gandy 09/15/2021, 2:47 PM

## 2021-09-16 DIAGNOSIS — J9691 Respiratory failure, unspecified with hypoxia: Secondary | ICD-10-CM

## 2021-09-16 LAB — GLUCOSE, CAPILLARY
Glucose-Capillary: 104 mg/dL — ABNORMAL HIGH (ref 70–99)
Glucose-Capillary: 85 mg/dL (ref 70–99)
Glucose-Capillary: 90 mg/dL (ref 70–99)
Glucose-Capillary: 92 mg/dL (ref 70–99)

## 2021-09-16 MED ORDER — JUVEN PO PACK: 1.0000 | PACK | Freq: Two times a day (BID) | ORAL | 0 refills | Status: AC

## 2021-09-16 MED ORDER — LEVOTHYROXINE SODIUM 100 MCG PO TABS: 100.0000 ug | ORAL_TABLET | Freq: Every day | ORAL | 0 refills | Status: AC

## 2021-09-16 MED ORDER — DOXYCYCLINE MONOHYDRATE 25 MG/5ML PO SUSR
100.0000 mg | Freq: Two times a day (BID) | ORAL | 0 refills | Status: DC
Start: 2021-09-16 — End: 2021-09-16

## 2021-09-16 MED ORDER — OSMOLITE 1.5 CAL PO LIQD
355.0000 mL | Freq: Four times a day (QID) | ORAL | Status: DC
  Administered 2021-09-16 – 2021-09-17 (×5): 355 mL

## 2021-09-16 MED ORDER — DOXYCYCLINE MONOHYDRATE 25 MG/5ML PO SUSR: 100.0000 mg | Freq: Two times a day (BID) | ORAL | 0 refills | Status: AC

## 2021-09-16 MED ORDER — QUETIAPINE FUMARATE 25 MG PO TABS: 25.0000 mg | ORAL_TABLET | Freq: Every day | ORAL | 0 refills | Status: AC

## 2021-09-16 MED ORDER — FREE WATER
100.0000 mL | Freq: Four times a day (QID) | Status: DC
  Administered 2021-09-16 – 2021-09-17 (×4): 100 mL

## 2021-09-16 MED ORDER — JEVITY 1.5 CAL/FIBER PO LIQD: 1500.0000 mL | ORAL | 0 refills | Status: DC

## 2021-09-16 MED ORDER — FREE WATER: 300.0000 mL | Freq: Four times a day (QID) | 0 refills | Status: DC

## 2021-09-16 MED ORDER — SACCHAROMYCES BOULARDII 250 MG PO CAPS: 250.0000 mg | ORAL_CAPSULE | Freq: Two times a day (BID) | ORAL | 0 refills | Status: AC

## 2021-09-16 MED ORDER — PROSOURCE TF PO LIQD: 45.0000 mL | Freq: Every day | ORAL | 0 refills | Status: AC

## 2021-09-16 NOTE — TOC Progression Note (Signed)
Transition of Care [facility]) - Progression Note    Patient Details  Name: Kristy [name] MRN: [id number] Date of Birth: September 06, 1946  Transition of Care [facility] CM/SW Contact  Epifanio Lesches, RN Phone Number: 09/16/2021, 1:42 PM  Clinical Narrative:   Pt will d/c to home with daughter  [name] once d/c ready. Per daughter DME delivered has been delivered to pt's home. Pt with PEG tube and will require enteral tube feeding. Daughter without preference for TF provider.  Referral made with [facility] [phone]), approval pending.  Teaching with daughter needs to be completed prior to d/c, bedside nurse is aware. [facility] to provide HHPT, OT, Nurse's Aide, Speech Therapy, RN.  Pt will need ambulance transportation to home with d/c.   TOC team will continue to monitor and assist with TOC needs...Marland KitchenMarland KitchenMarland Kitchen  Expected Discharge Plan: Home w Home Health Services Barriers to Discharge: Other (must enter comment) (enteral feeding pending)  Expected Discharge Plan and Services Expected Discharge Plan: Home w Home Health Services   Discharge Planning Services: CM Consult   Living arrangements for the past 2 months: Apartment Expected Discharge Date: 09/16/21               DME Arranged: Other see comment (ENTERAL FEEDING/ [facility])   Date DME Agency Contacted: 09/16/21 Time DME Agency Contacted: [phone] Representative spoke with at DME Agency: [facility] HH Arranged: PT, OT, Nurse's Aide, Speech Therapy, RN   Date HH Agency Contacted: 09/11/21 Time HH Agency Contacted: [phone] Representative spoke with at [facility] Agency: Kandee Keen   Social Determinants of Health (SDOH) Interventions    Readmission Risk Interventions Readmission Risk Prevention Plan 09/13/2021 09/13/2021  Transportation Screening Not Complete Complete  Transportation Screening Comment Pt has past due balance at [employer] and cannot be scheduled until it is addressed.  Daughter notified. -  PCP or Specialist  appointment within 3-5 days of discharge - Complete  HRI or Home Care Consult - Complete  SW Recovery Care/Counseling Consult - Complete  Palliative Care Screening - Complete  Skilled Nursing Facility - Complete  Some recent data might be hidden

## 2021-09-16 NOTE — Discharge Summary (Addendum)
Physician Discharge Summary  Kristy [name] [id number] DOB: 03-Jul-1946 DOA: 08/28/2021  PCP: Kristy Amel, MD  Admit date: 08/28/2021 Discharge date: 09/16/2021  Admitted From: Home  Disposition:  Home   Recommendations for Outpatient Follow-up and new medication changes:  Follow up with Dr. Dorthy Cooler in 7 to 10 days.  Continue home health services. At this point will dc home instead of SNF, her daughter will be care giver.  Palliative care as outpatient.  Follow thyroid function testing as outpatient, patient has been placed on levothyroxine.   Home Health: yes   Equipment/Devices:  hospital bed, wheelchair, bedside commode   Discharge Condition: stable  CODE STATUS: full  Diet recommendation:  dysphagia 1 with aspiration precautions.   Brief/Interim Summary: Kristy [name] was admitted to the hospital with the working diagnosis of acute hypoxemic respiratory failure, due to aspiration pneumonia, complicated with cardiac arrest and septic shock (present on admission).   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 In the emergency room she was found in respiratory distress, including encephalopathy and she and was intubated. While still in the ED she had cardiac arrest x2, requiring epinephrine and cardioversion.  On admission to the ICU her blood pressure was 195/138, HR 100, RR 57 and oxygenation 85%. Patient not responsive, lungs with bilateral rhonchi and thick secretions per ET tube, heart S1 and S2 present and rhythmic, abdomen soft and +2 lower extremity edema.    Na 137, K 5,7, Cl 105, bicarbonate 26, glucose 86, BUN 49 cr 1,14. Wbc 6,7, hgb 9,1, hct 31.3 and platelets 84.  SARS COVID 19 negative.    UA SG 1,020, 6-10 wbc, negative nitrates.    Head CT with no acute changes.  Neck CT with no acute changes.    Chest x ray with bilateral  pleural effusions more left than right with almost complete opacification of left side. Chest CT with bilateral pleural effusion and bilateral lower lobes dense nodular infiltrates.    Patient was admitted to the ICU with the working diagnosis of septic shock. She was placed on vasopressors and broad spectrum antibiotic therapy.  Blood cultures positive for Acinetobacter.  Vasopressors were weaned off with good toleration.    Patient liberated from mechanical ventilation on 09/01/21.    Transfer to [facility] on 09/03/21.  Completed antibiotic therapy with Unasyn 14 days.  Patient with recurrent hypoglycemia.   Patient continue to be very weak and deconditioned, she is tolerating tube feedings well. Now medically stable and her daughter has decided to take her home for further care.    Acute hypoxemic respiratory failure due to pneumonia, Acinetobacter bacteremia, septic shock.  Present on admission. Patient had a prolonged hospitalization, she was critically ill in intensive care unit, she received mechanical ventilation and vasopressors. Eventually she regained hemodynamic stability and she was liberated from mechanical ventilation. She received full course of antibiotic therapy during her hospitalization.  At the time of discharge she is oxygenating well, no dyspnea.  2.  Acute metabolic encephalopathy.  Likely multifactorial, including critical illness. Her mentation has been improving, she is able to respond questions appropriately, no agitation. She is getting nutrition through PEG tube (placed on 07/02/21), continue quetiapine and thiamine.  3.  Acute kidney injury due to acute tubular necrosis, hypomagnesemia, hypernatremia, hyperchloremia and hypokalemia. Patient received intravenous fluids and supportive medical therapy including vasopressors. Her electrolytes were corrected, at  the time of discharge sodium 146, potassium 4.1, chloride 113, bicarb 25, glucose 75, BUN 66, creatinine  0.71, magnesium 2.1 Continue tube feeds and free water flushes. Currently 300 cc of free water every 6 hours. Follow-up electrolytes as an outpatient.  4.  Systolic heart failure.  Acute heart failure exacerbation, likely related to septic shock.   Her echocardiography showed a reduction in ejection fraction down to 40 to 45%, severe hypokinesis/akinesis of the left ventricular, apical inferior septal wall and inferior wall and apical segment.  RV systolic function preserved.  Left atrium moderately dilated.  No significant valvular disease.  Continue medical therapy and close follow-up as an outpatient.  5.  Acute on chronic anemia.  Likely multifactorial, including critical illness. Patient required 2 units of packed red blood cell transfusion. Her discharge hemoglobin is 7.8, hematocrit 25.8.  6.  Recurrent hypoglycemia, severe protein calorie malnutrition, sacral and right buttock pressure ulcer stage II, present on admission. Patient had frequent glucose monitoring, initially hypoglycemic that has improved with enteral nutrition through PEG tube. Her capillary glucose today 90-85-92.  7.  History of CVA.  Dyslipidemia.  Patient is nonambulatory, positive swallow dysfunction, and has PEG tube for nutrition. Continue aspirin and atorvastatin. Patient will need close supervision at home, [facility] lift to transfer from bed to wheelchair.  8.  Recent discitis, osteomyelitis at C4-5, C6-7 and possibly T1-2.  Patient completed IV antibiotic therapy as planned on October 16.  Continue on suppressive antibiotic therapy with doxycycline for 30 days.  9.  Hypothyroid.  Continue levothyroxine.  Discharge Diagnoses:  Principal Problem:   Pneumonia Active Problems:   HTN (hypertension)   Vertebral osteomyelitis ([place])   Protein-calorie malnutrition, severe   Pressure injury of skin   Severe sepsis with septic shock (HCC)   Respiratory failure with hypoxia ([place])   Cardiac arrest ([place])   Acute  metabolic encephalopathy   Hypoglycemia    Discharge Instructions   Allergies as of 09/17/2021   No Known Allergies      Medication List     STOP taking these medications    amoxicillin-clavulanate 875-125 MG tablet Commonly known as: AUGMENTIN   ceFAZolin  IVPB Commonly known as: ANCEF   feeding supplement (JEVITY 1.2 CAL) Liqd Replaced by: nutrition supplement (JUVEN) Pack You also have another medication with the same name that you need to continue taking as instructed.   lubriderm seriously sensitive Lotn   OLANZapine 2.5 MG tablet Commonly known as: ZYPREXA   OLANZapine 5 MG tablet Commonly known as: ZYPREXA   oxyCODONE 5 MG immediate release tablet Commonly known as: Oxy IR/ROXICODONE       TAKE these medications    acetaminophen 500 MG tablet Commonly known as: TYLENOL Place 1 tablet (500 mg total) into feeding tube every 6 (six) hours as needed for mild pain (or Fever >/= 101).   albuterol (2.5 MG/3ML) 0.083% nebulizer solution Commonly known as: PROVENTIL Take 3 mLs (2.5 mg total) by nebulization every 6 (six) hours as needed for wheezing or shortness of breath.   allopurinol 100 MG tablet Commonly known as: ZYLOPRIM Take 100 mg by mouth daily.   aspirin 81 MG chewable tablet Place 1 tablet (81 mg total) into feeding tube daily.   atorvastatin 40 MG tablet Commonly known as: LIPITOR Place 1 tablet (40 mg total) into feeding tube at bedtime.   bisacodyl 10 MG suppository Commonly known as: DULCOLAX Place 10 mg rectally as needed for moderate constipation.   diclofenac Sodium 1 %  Gel Commonly known as: VOLTAREN Apply 4 g topically 4 (four) times daily. To neck   doxycycline 25 MG/5ML Susr Commonly known as: VIBRAMYCIN Place 20 mLs (100 mg total) into feeding tube 2 (two) times daily for 53 doses.   eucerin cream Apply 1 application topically in the morning, at noon, and at bedtime. Apply to Trunk & extremities for dry skin    nutrition supplement (JUVEN) Pack Place 1 packet into feeding tube 2 (two) times daily between meals. What changed: You were already taking a medication with the same name, and this prescription was added. Make sure you understand how and when to take each. Replaces: feeding supplement (JEVITY 1.2 CAL) Liqd   feeding supplement (PROSource TF) liquid Place 45 mLs into feeding tube daily. What changed:  when to take this Another medication with the same name was removed. Continue taking this medication, and follow the directions you see here.   feeding supplement (OSMOLITE 1.5 CAL) Liqd Place 355 mLs into feeding tube 4 (four) times daily. What changed: You were already taking a medication with the same name, and this prescription was added. Make sure you understand how and when to take each.   folic acid 1 MG tablet Commonly known as: FOLVITE Place 1 tablet (1 mg total) into feeding tube daily.   free water Soln Place 100 mLs into feeding tube every 6 (six) hours. What changed:  how much to take when to take this   hydrOXYzine 25 MG tablet Commonly known as: ATARAX/VISTARIL Place 0.5 tablets (12.5 mg total) into feeding tube every 8 (eight) hours as needed for itching.   levothyroxine 100 MCG tablet Commonly known as: SYNTHROID Place 1 tablet (100 mcg total) into feeding tube daily at 6 (six) AM.   loperamide HCl 1 MG/7.5ML suspension Commonly known as: IMODIUM Place 15 mLs (2 mg total) into feeding tube as needed for diarrhea or loose stools.   melatonin 3 MG Tabs tablet Take 1 tablet (3 mg total) by mouth at bedtime. What changed: how to take this   multivitamin Liqd Place 15 mLs into feeding tube daily.   niacin 500 MG tablet Place 1 tablet (500 mg total) into feeding tube at bedtime.   QUEtiapine 25 MG tablet Commonly known as: SEROQUEL Place 1 tablet (25 mg total) into feeding tube at bedtime.   saccharomyces boulardii 250 MG capsule Commonly known as:  FLORASTOR Place 1 capsule (250 mg total) into feeding tube 2 (two) times daily.   thiamine 100 MG tablet Place 1 tablet (100 mg total) into feeding tube daily.               Durable Medical Equipment  (From admission, onward)           Start     Ordered   09/16/21 1116  For home use only DME Tube feeding  Once       Comments: Continue tube feeding via PEG: -Jevity 1.5 @ 60 ml/hr (1440 ml/day) - ProSource TF 45 ml daily -free water per MD, currently 140ml Q6H    Tube feeding regimen provides 2220 kcal, 103 grams of protein, and 1094 ml of H2O (1442ml total free water with flushes   09/16/21 1121   09/16/21 1011  For home use only DME Other see comment  Once       Comments: Hoyer lift to transfer out of the bed  Question:  Length of Need  Answer:  12 Months   09/16/21 1011   09/09/21  0953  For home use only DME Bedside commode  Once       Question:  Patient needs a bedside commode to treat with the following condition  Answer:  Generalized weakness   09/09/21 0953   09/09/21 0952  For home use only DME standard manual wheelchair with seat cushion  Once       Comments: 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  A cane, crutch, or walker will not resolve issue with performing activities of daily living. A wheelchair will allow patient to safely perform daily activities. Patient can safely propel the wheelchair in the home or has a caregiver who can provide assistance. Length of need Lifetime. Accessories: elevating leg rests (ELRs), wheel locks, extensions and anti-tippers.   09/09/21 0953   09/09/21 0951  For home use only DME Hospital bed  Once       Question Answer Comment  Length of Need Lifetime   The above medical condition requires: Patient requires the ability to reposition frequently   Head  must be elevated greater than: 30 degrees   Bed type Semi-electric   Hoyer Lift Yes   Trapeze Bar Yes   Support Surface: Alternating Pressure Pad and Pump      09/09/21 0953            Follow-up Information     Koirala, Dibas, MD Follow up in 1 week(s).   Specialty: Family Medicine Contact information: [place] [address] [phone]         Janith Lima, MD Follow up in 1 week(s).   Specialty: Urology Contact information: [place] [address] [phone]         [facility], [facility] Follow up.   Specialty: Home Health Services Why: [facility] will contact you to schedule your first home visit. Contact information: [place] [address] [phone]                No Known Allergies  Consultations: ID Cardiology Palliative Care    Procedures/Studies: CT ABDOMEN PELVIS WO CONTRAST  Result Date: 08/29/2021 CLINICAL DATA:  Abdominal pain EXAM: CT ABDOMEN AND PELVIS WITHOUT CONTRAST TECHNIQUE: Multidetector CT imaging of the abdomen and pelvis was performed following the standard protocol without IV contrast. COMPARISON:  07/20/2021, 06/27/2021 FINDINGS: Lower chest: Moderate bilateral pleural effusions with compressive lower lobe atelectasis. Central venous catheter at the atriocaval junction. Hepatobiliary: Unenhanced imaging of the liver and gallbladder demonstrates no acute abnormalities. No biliary duct dilation. Pancreas: Unremarkable. No pancreatic ductal dilatation or surrounding inflammatory changes. Spleen: Normal in size without focal abnormality. Adrenals/Urinary Tract: Simple left renal cyst unchanged. No urinary tract calculi or obstructive uropathy. The bladder is decompressed with a Foley catheter in place. The adrenals are normal. Stomach/Bowel: Enteric catheter extends into the gastric lumen, tip in the region of the gastric antrum. There is a  percutaneous gastrostomy tube unchanged within the gastric lumen. No bowel obstruction or ileus. Normal appendix right lower quadrant. No bowel wall thickening or inflammatory change. Vascular/Lymphatic: Aortic atherosclerosis. No enlarged abdominal or pelvic lymph nodes. Reproductive: Status post hysterectomy. No adnexal masses. Other: No free fluid or free intraperitoneal gas. No abdominal wall hernia. Musculoskeletal: The discitis/osteomyelitis at the T12/L1 level is again identified, with slight increase in the endplate resorption  and adjacent sclerosis since prior study. No evidence of paraspinal fluid collection in this region. There are no acute displaced fractures. Reconstructed images demonstrate no additional findings. IMPRESSION: 1. Slight progression of the discitis/osteomyelitis centered at the T12/L1 level on previous examinations. No associated paraspinal fluid collection or abscess. 2. Moderate bilateral pleural effusions with compressive atelectasis. 3. Otherwise no acute intra-abdominal or intrapelvic process. 4. Support devices as above. 5.  Aortic Atherosclerosis (ICD10-I70.0). Electronically Signed   By: Randa Ngo M.D.   On: 08/29/2021 16:35   DG Chest 1 View  Result Date: 09/14/2021 CLINICAL DATA:  Altered mental status, PICC EXAM: CHEST  1 VIEW COMPARISON:  09/10/2021 chest radiograph. FINDINGS: Right PICC terminates in the upper third of the SVC, with interval retraction. Surgical hardware from ACDF in the lower cervical spine. Stable cardiomediastinal silhouette with mild cardiomegaly. No pneumothorax. No pleural effusion. No pulmonary edema. No acute consolidative airspace disease. IMPRESSION: 1. Right PICC terminates in the upper third of the SVC, with interval retraction. 2. Stable mild cardiomegaly without pulmonary edema. Electronically Signed   By: Ilona Sorrel M.D.   On: 09/14/2021 11:42   CT HEAD WO CONTRAST (5MM)  Result Date: 08/28/2021 CLINICAL DATA:  75 year old  female with altered mental status, possible sepsis, status post cardiac arrest. 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 EXAM: CT HEAD WITHOUT CONTRAST TECHNIQUE: Contiguous axial images were obtained from the base of the skull through the vertex without intravenous contrast. COMPARISON:  Brain MRI 07/21/2021.  Head CT 07/20/2021 and earlier. FINDINGS: Brain: Hypodense left central pontine infarct is stable from last month. No associated hemorrhage or mass effect. Questionable subtle decreased gray-white matter differentiation in the cerebral hemispheres now (series 3, image 24), but no convincing loss of cerebral sulci. Stable ventricle size. No intracranial mass effect. No midline shift, ventriculomegaly, mass effect, evidence of mass lesion, intracranial hemorrhage or evidence of cortically based acute infarction. Vascular: Mild Calcified atherosclerosis at the skull base. No suspicious intracranial vascular hyperdensity. Skull: Stable.  No acute osseous abnormality identified. Sinuses/Orbits: Visualized paranasal sinuses and mastoids are stable and well aerated. Other: Intubated and oral enteric tube in place with fluid in the pharynx. Disconjugate gaze. No acute scalp soft tissue finding. IMPRESSION: Difficult to exclude early loss of cerebral hemisphere gray-white differentiation, such as from anoxic injury in the setting of cardiac arrest. But otherwise stable non contrast CT appearance of the brain from last month. If the clinical course is equivocal then a repeat Head CT in 24 hours or follow-up Brain MRI would should be confirmatory. Electronically Signed   By: Genevie Ann M.D.   On: 08/28/2021 10:07   CT Chest Wo Contrast  Result Date: 08/28/2021 CLINICAL DATA:  Chest pain, shortness of breath, post cardiac arrest EXAM: CT CHEST WITHOUT CONTRAST TECHNIQUE: Multidetector CT imaging of the chest was performed following the standard protocol without IV contrast. COMPARISON:  07/20/2021 FINDINGS: Cardiovascular:  Right IJ central line placement to the mid right atrium. Heart size normal. Minimal pericardial fluid. Mediastinum/Nodes: Endotracheal tube terminates 2.5 cm above carina. Gastric tube extends at least as far as the stomach. No mediastinal hematoma, mass, or adenopathy. Prominent bilateral axillary nodes largest 1.1 cm short axis diameter on the left, stable since previous. Lungs/Pleura: Moderate right and smaller left pleural effusions, increased since previous. No pneumothorax. Scattered somewhat nodular airspace opacities centrally in both lower lobes and in the posterior aspect of the right upper lobe, new since previous. Upper Abdomen: No acute findings. Aortic Atherosclerosis (ICD10-170.0). Musculoskeletal:  Cervical fixation hardware partially visualized. Anterior vertebral endplate spurring at multiple levels in the mid and lower thoracic spine. Left shoulder DJD with coracoclavicular mineralization. No acute fracture or worrisome bone lesion. IMPRESSION: 1. Progressive pleural effusions right greater than left. 2. New patchy bilateral airspace opacities as above. 3. Endotracheal tube and gastric tube placement. Electronically Signed   By: Lucrezia Europe M.D.   On: 08/28/2021 10:04   CT CERVICAL SPINE WO CONTRAST  Result Date: 08/28/2021 CLINICAL DATA:  75 year old female with altered mental status, possible sepsis, status post cardiac arrest. 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 EXAM: CT CERVICAL SPINE WITHOUT CONTRAST TECHNIQUE: Multidetector CT imaging of the cervical spine was performed without intravenous contrast. Multiplanar CT image reconstructions were also generated. COMPARISON:  Cervical spine CT 06/11/2021. MRI 07/21/2021 and earlier. FINDINGS: Alignment: Improved cervical lordosis since August. Anterolisthesis of C7 on T1 is stable since August. Bilateral posterior element alignment is within normal limits. Skull base and vertebrae: Visualized skull base is intact. No atlanto-occipital  dissociation. Congenital incomplete ossification of the posterior C1 ring. C1 and C2 appear intact and aligned. Bone mineralization in the cervical spine becomes abnormal at C5-C6, although sclerosis there might be postoperative (details below). But there is osteolysis of the C6 inferior endplate, also the C7 and T1 vertebral bodies since August in keeping with osteomyelitis. No C7 or T1 collapse at this time. No acute cervical spine fracture identified. Soft tissues and spinal canal: Intubated and oral enteric tube with fluid in the pharynx. Right IJ central line. Negative CT appearance of the cervical spinal canal. Disc levels: C5-C6 ACDF with no definite hardware loosening. Evidence of developing interbody arthrodesis (sagittal image 29). Widespread cervical facet arthropathy. Upper chest: In addition to the abnormal T1 vertebral body, the superior half of T2 also appears irregular and partially eroded now. Partially visible layering pleural effusions in the lung apices. ET tube continues into the trachea. Enteric tube continues into the proximal esophagus. IMPRESSION: 1. Sequelae of C6 through T2 Discitis Osteomyelitis with vertebral endplate erosions since August. No vertebral body collapse at this time. 2. No superimposed acute cervical spine fracture identified. C5-C6 ACDF with intact hardware and evidence of developing interbody arthrodesis. 3. Intubated and oral enteric tube in place. Layering pleural effusions in the lung apices. Electronically Signed   By: Genevie Ann M.D.   On: 08/28/2021 10:13   DG CHEST PORT 1 VIEW  Result Date: 09/10/2021 CLINICAL DATA:  PICC placement. EXAM: PORTABLE CHEST 1 VIEW COMPARISON:  Chest radiograph 08/28/2021 FINDINGS: Right upper extremity PICC tip is in the mid-lower SVC. The previous right internal jugular catheter is been removed. Interval extubation and removal of enteric tube. Mild cardiomegaly persists. Improved bilateral pleural effusions and airspace opacity.  Minimal residual blunting of costophrenic angles. Surgical hardware in the lower cervical spine. IMPRESSION: 1. Tip of the right upper extremity PICC in the mid-lower SVC. 2. Improved bilateral pleural effusions and airspace opacity. Electronically Signed   By: Keith Rake M.D.   On: 09/10/2021 22:53   DG Chest Portable 1 View  Result Date: 08/28/2021 CLINICAL DATA:  Intubation and central line placement EXAM: PORTABLE CHEST 1 VIEW COMPARISON:  Earlier same day FINDINGS: Patient is rotated. New endotracheal tube is present and the proximally 2.5 cm above the carina. Right IJ central line overlies right atrium. Enteric tube passes below the diaphragm with tip out of field of view. No pneumothorax. Significantly improved aeration of the left lung. Persistent right pleural effusion and adjacent atelectasis with  improved aeration at the right lung base. Stable cardiomediastinal contours. IMPRESSION: Lines and tubes as above.  No pneumothorax. Significantly improved aeration of the left lung. Persistent right pleural effusion and adjacent atelectasis with improved aeration at the right lung base. Electronically Signed   By: Guadlupe Spanish M.D.   On: 08/28/2021 08:54   DG Chest Port 1 View  Result Date: 08/28/2021 CLINICAL DATA:  Encounter for questionable sepsis, evaluate for abnormality EXAM: PORTABLE CHEST - 1 VIEW COMPARISON:  07/20/2021 FINDINGS: Interval development of large left pleural effusion with near complete consolidation/atelectasis at the left lung, only a small aerated segment of the upper lobe visible. There is some leftward mediastinal shift suggesting a degree of volume loss, raising concern of central obstructing lesion. Smaller right pleural effusion with some atelectasis/consolidation in the right lower lung. Heart size difficult to assess due to adjacent opacities. No pneumothorax. Spinal fixation hardware partially visualized. No acute bone abnormalities noted. IMPRESSION: Near  complete opacification of left hemithorax with volume loss, suggesting possible central obstructing lesion and effusion. Consider CT chest with contrast for further evaluation Electronically Signed   By: Corlis Leak M.D.   On: 08/28/2021 06:50   DG Abd Portable 1V  Result Date: 08/29/2021 CLINICAL DATA:  Encounter for orogastric (OG) tube placement Z46.59 (ICD-10-CM) EXAM: PORTABLE ABDOMEN - 1 VIEW COMPARISON:  Chest x-ray 08/28/2021 FINDINGS: Limited radiograph of the lower chest and upper abdomen was obtained for the purposes of enteric tube localization. Enteric tube is seen coursing below the diaphragm with side port projecting within the expected location of the gastric body, and distal tip extending inferiorly beyond the field of view. Partially visualized right IJ central venous catheter, likely within the region of the right atrium. Hazy opacity at the right lung base, compatible with known layering pleural effusion. IMPRESSION: Enteric tube tip and side port project within the expected location of the gastric body. Electronically Signed   By: Duanne Guess D.O.   On: 08/29/2021 14:45   EEG adult  Result Date: 08/28/2021 Charlsie Quest, MD     08/28/2021  1:20 PM Patient Name: EMIKO OSORTO MRN: [id number] Epilepsy Attending: Charlsie Quest Referring Physician/Provider: Dr Dellia Cloud Date: 08/28/2021 Duration: 38.13 mins Patient history: 75yo F with ams and cardiac arrest. EEG to evaluate for seizure. Level of alertness: comatose AEDs during EEG study: None Technical aspects: This EEG study was done with scalp electrodes positioned according to the 10-20 International system of electrode placement. Electrical activity was acquired at a sampling rate of 500Hz  and reviewed with a high frequency filter of 70Hz  and a low frequency filter of 1Hz . EEG data were recorded continuously and digitally stored. Description: EEG showed continuous generalized 3 to 6 Hz theta-delta slowing.  Hyperventilation and photic stimulation were not performed.   ABNORMALITY - Continuous slow, generalized IMPRESSION: This study is suggestive of severe diffuse encephalopathy, nonspecific etiology. No seizures or epileptiform discharges were seen throughout the recording.   ECHOCARDIOGRAM LIMITED  Result Date: 08/28/2021    ECHOCARDIOGRAM LIMITED REPORT   Patient Name:   ROBBIE NANGLE Date of Exam: 08/28/2021 Medical Rec #:  08/30/2021     Height:       66.0 in Accession #:    [name]    Weight:       185.5 lb Date of Birth:  [birth year]      BSA:          1.937 m Patient Age:  75 years      BP:           107/78 mmHg Patient Gender: F             HR:           70 bpm. Exam Location:  Inpatient Procedure: Limited Echo, Cardiac Doppler and Color Doppler Indications:    Cardiac arrest  History:        Patient has prior history of Echocardiogram examinations, most                 recent 07/21/2021. Signs/Symptoms:Shortness of Breath. CKD.  Sonographer:    Clayton Lefort RDCS (AE) Referring Phys: [phone] [facility] D HARRIS  Sonographer Comments: Echo performed with patient supine and on artificial respirator. IMPRESSIONS  1. Left ventricular ejection fraction, by estimation, is 40 to 45%. The left ventricle has mildly decreased function. The left ventricle demonstrates regional wall motion abnormalities (see scoring diagram/findings for description). There is mild concentric left ventricular hypertrophy. There is severe hypokinesis/akinesis of the left ventricular, apical inferoseptal wall, inferior wall and apical segment.  2. Right ventricular systolic function is normal. The right ventricular size is normal.  3. Left atrial size was moderately dilated.  4. Mobile structure is seen, probably tip of a venous catheter.  5. Large pleural effusion in the left lateral region.  6. The mitral valve is normal in structure. Mild mitral valve regurgitation.  7. Tricuspid valve regurgitation is mild to moderate.   8. The aortic valve is tricuspid. Aortic valve regurgitation is mild to moderate. No aortic stenosis is present. Comparison(s): Prior images reviewed side by side. The left ventricular function is significantly worse. The left ventricular wall motion abnormality is new. FINDINGS  Left Ventricle: Left ventricular ejection fraction, by estimation, is 40 to 45%. The left ventricle has mildly decreased function. The left ventricle demonstrates regional wall motion abnormalities. Severe hypokinesis/akinesis of the left ventricular, apical inferoseptal wall, inferior wall and apical segment. The left ventricular internal cavity size was normal in size. There is mild concentric left ventricular hypertrophy. Right Ventricle: The right ventricular size is normal. No increase in right ventricular wall thickness. Right ventricular systolic function is normal. Left Atrium: Left atrial size was moderately dilated. Right Atrium: Mobile structure is seen, probably tip of a venous catheter. Right atrial size was normal in size. Pericardium: There is no evidence of pericardial effusion. Mitral Valve: The mitral valve is normal in structure. Mild mitral valve regurgitation. Tricuspid Valve: The tricuspid valve is normal in structure. Tricuspid valve regurgitation is mild to moderate. Aortic Valve: The aortic valve is tricuspid. Aortic valve regurgitation is mild to moderate. No aortic stenosis is present. Venous: IVC assessment for right atrial pressure unable to be performed due to mechanical ventilation. Additional Comments: A venous catheter is visualized in the right atrium. There is a large pleural effusion in the left lateral region. LEFT VENTRICLE PLAX 2D LVIDd:         4.80 cm      Diastology LVIDs:         3.90 cm      LV e' medial:    6.31 cm/s LV PW:         1.20 cm      LV E/e' medial:  12.5 LV IVS:        1.30 cm      LV e' lateral:   5.77 cm/s LVOT diam:     2.10 cm  LV E/e' lateral: 13.6 LVOT Area:     3.46 cm  LV  Volumes (MOD) LV vol d, MOD A2C: 99.6 ml LV vol d, MOD A4C: 107.0 ml LV vol s, MOD A2C: 53.4 ml LV vol s, MOD A4C: 68.2 ml LV SV MOD A2C:     46.2 ml LV SV MOD A4C:     107.0 ml LV SV MOD BP:      42.5 ml IVC IVC diam: 2.30 cm LEFT ATRIUM         Index LA diam:    4.40 cm 2.27 cm/m   AORTA Ao Root diam: 3.10 cm Ao Asc diam:  3.10 cm MITRAL VALVE               TRICUSPID VALVE MV Area (PHT): 3.17 cm    TR Peak grad:   18.8 mmHg MV Decel Time: 239 msec    TR Vmax:        217.00 cm/s MV E velocity: 78.60 cm/s MV A velocity: 89.70 cm/s  SHUNTS MV E/A ratio:  0.88        Systemic Diam: 2.10 cm Dani Gobble Croitoru MD Electronically signed by Sanda Klein MD Signature Date/Time: 08/28/2021/5:43:16 PM    Final    [place] EKG SITE RITE  Result Date: 09/02/2021 If Site Rite image not attached, placement could not be confirmed due to current cardiac rhythm.  [place] EKG SITE RITE  Result Date: 09/02/2021 If Site Rite image not attached, placement could not be confirmed due to current cardiac rhythm.     Subjective: Patient with no nausea or vomiting, no chest pain or dyspnea, continue to be very weak and deconditioned, tolerating tube feedings.   Discharge Exam: Vitals:   09/15/21 2248 09/16/21 0822  BP: 135/73 116/68  Pulse: 78 86  Resp: 18   Temp:  98.5 F (36.9 C)  SpO2: 90% 100%   Vitals:   09/14/21 1700 09/14/21 2114 09/15/21 2248 09/16/21 0822  BP: 112/67 135/64 135/73 116/68  Pulse: 92  78 86  Resp: 20  18   Temp: 98.4 F (36.9 C)   98.5 F (36.9 C)  TempSrc: Oral     SpO2:   90% 100%  Weight:      Height:        General: Not in pain or dyspnea, deconditioned  Neurology: Awake and alert, non focal  E ENT: no pallor, no icterus, oral mucosa moist Cardiovascular: No JVD. S1-S2 present, rhythmic, no gallops, rubs, or murmurs. No lower extremity edema. Pulmonary: positive breath sounds bilaterally, adequate air movement, no wheezing, rhonchi or rales. Gastrointestinal. Abdomen soft and non  tender, PEG tube in place Skin. No rashes Musculoskeletal: no joint deformities   The results of significant diagnostics from this hospitalization (including imaging, microbiology, ancillary and laboratory) are listed below for reference.     Microbiology: No results found for this or any previous visit (from the past 240 hour(s)).   Labs: BNP (last 3 results) Recent Labs    07/06/21 0217 07/20/21 2147 08/28/21 0835  BNP 452.3* 169.1* [id number]*   Basic Metabolic Panel: Recent Labs  Lab 09/11/21 0341 09/12/21 0242 09/14/21 0734 09/15/21 0354  NA 141 144 149* 146*  K 3.9 4.0 4.1 4.1  CL 108 111 115* 113*  CO2 25 25 25 25   GLUCOSE 87 82 102* 75  BUN 40* 52* 68* 66*  CREATININE 0.72 0.81 0.94 0.71  CALCIUM 8.9 9.2 9.1 9.0  MG 1.6*  --   --  2.1  PHOS 3.5  --   --   --    Liver Function Tests: Recent Labs  Lab 09/11/21 0341  ALBUMIN 2.4*   No results for input(s): LIPASE, AMYLASE in the last 168 hours. No results for input(s): AMMONIA in the last 168 hours. CBC: Recent Labs  Lab 09/11/21 0341 09/12/21 0242 09/14/21 0734 09/15/21 0354  WBC 6.2 7.9 6.3  --   HGB 7.8* 7.8* 6.3* 7.8*  HCT 24.7* 26.2* 21.2* 25.8*  MCV 102.9* 105.6* 109.8*  --   PLT 158 190 171  --    Cardiac Enzymes: No results for input(s): CKTOTAL, CKMB, CKMBINDEX, TROPONINI in the last 168 hours. BNP: Invalid input(s): POCBNP CBG: Recent Labs  Lab 09/15/21 1737 09/15/21 2041 09/16/21 0048 09/16/21 0413 09/16/21 0822  GLUCAP 77 80 104* 90 85   D-Dimer No results for input(s): DDIMER in the last 72 hours. Hgb A1c No results for input(s): HGBA1C in the last 72 hours. Lipid Profile No results for input(s): CHOL, HDL, LDLCALC, TRIG, CHOLHDL, LDLDIRECT in the last 72 hours. Thyroid function studies No results for input(s): TSH, T4TOTAL, T3FREE, THYROIDAB in the last 72 hours.  Invalid input(s): FREET3 Anemia work up No results for input(s): VITAMINB12, FOLATE, FERRITIN, TIBC, IRON,  RETICCTPCT in the last 72 hours. Urinalysis    Component Value Date/Time   COLORURINE YELLOW 08/28/2021 [place] 08/28/2021 1205   LABSPEC 1.020 08/28/2021 1205   PHURINE 5.5 08/28/2021 1205   GLUCOSEU NEGATIVE 08/28/2021 1205   HGBUR SMALL (A) 08/28/2021 1205   [place] 08/28/2021 [place] 08/28/2021 1205   PROTEINUR NEGATIVE 08/28/2021 1205   UROBILINOGEN 0.2 05/14/2008 0020   NITRITE NEGATIVE 08/28/2021 1205   LEUKOCYTESUR SMALL (A) 08/28/2021 1205   Sepsis Labs Invalid input(s): PROCALCITONIN,  WBC,  LACTICIDVEN Microbiology No results found for this or any previous visit (from the past 240 hour(s)).   Time coordinating discharge: 45 minutes  SIGNED:   Tawni Millers, MD  [facility] 09/16/2021, 11:35 AM

## 2021-09-16 NOTE — Progress Notes (Signed)
Speech Language Pathology Treatment: Dysphagia  Patient Details Name: Kristy [name] MRN: [id number] DOB: 1946/02/22 Today's Date: 09/16/2021 Time: 2951-8841 SLP Time Calculation (min) (ACUTE ONLY): 12 min  Assessment / Plan / Recommendation Clinical Impression  [name] was alert and requesting water, saying she was thirsty.  She was sitting upright and head was repositioned to assist neck from extension into a neutral position. She tolerated thin liquids from a straw with no s/sx of aspiration.  She accepted a few spoons of applesauce but declined graham crackers. She was pleasant and politely declined further POs.  Only occasional verbal cues are needed to monitor rate; benefits from a lot of encouragement to eat.  Primary nutrition continues to be via PEG.  Recommend continuing current diet of dysphagia 1/thin liquids. She doesn't appear to have prohibitive oral deficits that would prevent her from eating an advanced diet, but her refusals don't allow full assessment.  Given this situation, acute care SLP services will sign off.      HPI HPI: Patient is a 75 y.o. female with PMH: ETOH abuse, prior admission on 06/10/2021 for encephalopathy, cervical spine discitis and osteomyelitis for which she underwent I&D with ACDF C5-C6, dysphagia and malnutrition requiring PEG tube on 07/11/2021 and urinary tension requiring Foley catheter. 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 She was discharged 9/27 to SNF.   Patient presented to ED 10/19 for SOB and AMS. She had a cardiac arrest while in ED requiring CPR. She was admitted to ICU with respiratory failure requiring intubation and mechanical ventilation in setting of possible aspiration PNA and encephalopathy. She was extubated on 10/25. MBS during previous hospital stay on 8/15 revealed moderately impaired dysphagia from both mechanical and cognitive components. Patient was upgraded to dys 3 solids and thin liquids however PEG was  placed due to poor intake.      SLP Plan  All goals met      Recommendations for follow up therapy are one component of a multi-disciplinary discharge planning process, led by the attending physician.  Recommendations may be updated based on patient status, additional functional criteria and insurance authorization.    Recommendations  Diet recommendations: Dysphagia 1 (puree);Thin liquid Liquids provided via: Cup;Straw Medication Administration: Via alternative means Supervision: Trained caregiver to feed patient Compensations: Slow rate;Small sips/bites Postural Changes and/or Swallow Maneuvers: Seated upright 90 degrees                Oral Care Recommendations: Oral care BID;Staff/trained caregiver to provide oral care Follow up Recommendations: 24 hour supervision/assistance;Skilled Nursing facility SLP Visit Diagnosis: Dysphagia, unspecified (R13.10) Plan: All goals met       Keerat Denicola L. Tivis Ringer, [place] CCC/SLP Acute Rehabilitation Services Office number [phone] Pager [phone]                 Juan Quam Laurice  09/16/2021, 11:08 AM

## 2021-09-16 NOTE — Progress Notes (Addendum)
Nutrition Follow-up  DOCUMENTATION CODES:   Severe malnutrition in context of acute illness/injury  INTERVENTION:  - Discontinue Jevity 1.5 continuous tube feeds and Juven  Transition to bolus tube feeding regimen via PEG.  - 1.5 cartons ( ) Osmolite 1.5 formula QID at 0800, 1200, 1600, and 2000 - Free water flush of 60 mL before and after each bolus feed - Additional free water flushes of 4 times a day  Bolus TF regimen provides 2130 kcal, 89 grams of protein, and 1086 ml of H2O ( total free water with flushes)  Bolus tube feeding recommendations for home use: - 1.5 cartons ( ) Jevity 1.5 formula QID at 0800, 1200, 1600, and 2000 - Free water flush of 60 mL before and after each bolus feed - Additional free water flushes of 4 times a day  Bolus TF regimen provides 2130 kcal, 91 grams of protein, and 1080 ml of H2O ( total free water with flushes)  NUTRITION DIAGNOSIS:   Severe Malnutrition related to acute illness (bacteremia) as evidenced by mild fat depletion, moderate muscle depletion, percent weight loss (11.7% weight loss in less than 2 months).  On-going  GOAL:   Patient will meet greater than or equal to 90% of their needs  Continuing to meet needs through TF   MONITOR:   Labs, Weight trends, TF tolerance, Diet advancement, Skin, I & O's  REASON FOR ASSESSMENT:   Ventilator, Consult Enteral/tube feeding initiation and management  ASSESSMENT:   75 year old female who presented to the ED from SNF on 10/19 with AMS. 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 Pt required intubation in the ED and suffered cardiac arrest x 2 in the ED. Pt admitted with septic shock, AKI.  10/22 - extubated 10/25 - tx back to [facility] service 11/1: diet adv to D1/thin per SLP  Pt resting and unable to provide history during visit. No family present. Diet has been advanced however, noted  documentation of poor PO intake. Meal completions 0-5%. Received request from case manager for home bolus tube feed recommendations. Spoke with home health liaison about home bolus tube feeding regimen and she will provide education to the family. Pt continues on Jevity 1.5 continuous tube feeds via PEG. It was requested by [facility] liaison that we need to trial pt on bolus feed regimen prior to discharge. Will place recommendations and monitor for tolerance. Communicated this change with RN.  Medications: vitamin c, thiamine Labs: reviewed CBGs: 80-104 x 24 hours  UOP: x24 hours I/Os: -10,891 mL since admission  Diet Order:   Diet Order             Diet - low sodium heart healthy           DIET - DYS 1 Room service appropriate? No; Fluid consistency: Thin  Diet effective now                   EDUCATION NEEDS:   Not appropriate for education at this time  Skin:  Skin Assessment: Skin Integrity Issues: Skin Integrity Issues:: Stage II Stage II: R coccyx, sacrum, R buttock Other: MASD to bilateral buttocks  Last BM:  09/15/21  Height:   Ht Readings from Last 1 Encounters:  08/28/21 5\' 6"  (1.676 m)    Weight:   Wt Readings from Last 1 Encounters:  09/05/21 77.6 kg    BMI:  Body mass index is 27.61 kg/m.  Estimated Nutritional Needs:  Kcal:  1900-2100  Protein:  95-110g  Fluid:  >/=2.0L  Clayborne Dana, RDN, LDN Clinical Nutrition

## 2021-09-17 LAB — CREATININE, SERUM
Creatinine, Ser: 0.78 mg/dL (ref 0.44–1.00)
GFR, Estimated: >60 mL/min (ref >60)

## 2021-09-17 LAB — GLUCOSE, CAPILLARY
Glucose-Capillary: 66 mg/dL — ABNORMAL LOW (ref 70–99)
Glucose-Capillary: 94 mg/dL (ref 70–99)
Glucose-Capillary: 103 mg/dL — ABNORMAL HIGH (ref 70–99)
Glucose-Capillary: 94 mg/dL (ref 70–99)
Glucose-Capillary: 87 mg/dL (ref 70–99)
Glucose-Capillary: 67 mg/dL — ABNORMAL LOW (ref 70–99)
Glucose-Capillary: 154 mg/dL — ABNORMAL HIGH (ref 70–99)

## 2021-09-17 MED ORDER — OSMOLITE 1.5 CAL PO LIQD: 355.0000 mL | Freq: Four times a day (QID) | ORAL | 0 refills | Status: AC

## 2021-09-17 MED ORDER — FREE WATER: 100.0000 mL | Freq: Four times a day (QID) | 0 refills | Status: AC

## 2021-09-17 NOTE — TOC Transition Note (Signed)
Transition of Care [facility]) - CM/SW Discharge Note   Patient Details  Name: Kristy [name] MRN: [id number] Date of Birth: Apr 04, 1946  Transition of Care [facility]) CM/SW Contact:  Kristy Lesches, RN Phone Number: 09/17/2021, 4:38 PM   Clinical Narrative:    Patient will DC to: home Anticipated DC date: 09/17/2021 Family notified: daughter Transport by: Civil engineer, contracting Ambulance   Per MD patient ready for DC today . RN, patient, patient's family, and [facility] notified of DC. Kristy [name] / [facility] to provide TF teaching with daughter prior to d/c @ bedside.Once completed pt ok for d/c.  Ambulance transport requested for patient, TIME PICKUP FOR 6:30 PM.   Kristy [name] (Daughter)      [phone]      Post hospital f/u noted on AVS.  RNCM will sign off for now as intervention is no longer needed. Please consult [place] again if new needs arise.    Final next level of care: Home w Home Health Services Barriers to Discharge: No Barriers Identified   Patient Goals and CMS Choice Patient states their goals for this hospitalization and ongoing recovery are:: To go home   Choice offered to / list presented to : Adult Children  Discharge Placement                       Discharge Plan and Services   Discharge Planning Services: CM Consult            DME Arranged: Other see comment ([facility]/ [facility])   Date DME Agency Contacted: 09/16/21 Time DME Agency Contacted: 1340 Representative spoke with at DME Agency: Kristy [name] HH Arranged: PT, OT, Nurse's Aide, Speech Therapy, RN   Date HH Agency Contacted: 09/11/21 Time HH Agency Contacted: 1443 Representative spoke with at [facility] Agency: Kristy [name]  Social Determinants of Health (SDOH) Interventions     Readmission Risk Interventions Readmission Risk Prevention Plan 09/13/2021 09/13/2021  Transportation Screening Not Complete Complete  Transportation Screening Comment Pt has past due balance at [employer] and  cannot be scheduled until it is addressed.  Daughter notified. -  PCP or Specialist appointment within 3-5 days of discharge - Complete  HRI or Home Care Consult - Complete  SW Recovery Care/Counseling Consult - Complete  Palliative Care Screening - Complete  Skilled Nursing Facility - Complete  Some recent data might be hidden

## 2021-09-17 NOTE — TOC Progression Note (Addendum)
Transition of Care [facility]) - Progression Note    Patient Details  Name: Kristy [name] MRN: [id number] Date of Birth: 08/31/1946  [facility] CM/SW Contact  Epifanio Lesches, RN Phone Number: 09/17/2021, 10:17 AM  Clinical Narrative:    Elita Quick with [facility] made NCM aware approval received for enteral feeding. Daughter will come to hospital after work for teaching, 4 pm. Hoping to d/c today to home... TOC team will continue to monitor and assist with TOC NEEDS.   Expected Discharge Plan: Home w Home Health Services Barriers to Discharge: Other (must enter comment)  Expected Discharge Plan and Services Expected Discharge Plan: Home w Home Health Services   Discharge Planning Services: CM Consult   Living arrangements for the past 2 months: Apartment Expected Discharge Date: 09/17/21               DME Arranged: Other see comment ([facility]/ [facility])   Date DME Agency Contacted: 09/16/21 Time DME Agency Contacted: 1340 Representative spoke with at DME Agency: [facility] HH Arranged: PT, OT, Nurse's Aide, Speech Therapy, RN   Date HH Agency Contacted: 09/11/21 Time HH Agency Contacted: 1443 Representative spoke with at [facility] Agency: Kandee Keen   Social Determinants of Health (SDOH) Interventions    Readmission Risk Interventions Readmission Risk Prevention Plan 09/13/2021 09/13/2021  Transportation Screening Not Complete Complete  Transportation Screening Comment Pt has past due balance at [employer] and cannot be scheduled until it is addressed.  Daughter notified. -  PCP or Specialist appointment within 3-5 days of discharge - Complete  HRI or Home Care Consult - Complete  SW Recovery Care/Counseling Consult - Complete  Palliative Care Screening - Complete  Skilled Nursing Facility - Complete  Some recent data might be hidden

## 2021-09-17 NOTE — Progress Notes (Signed)
Discharge was delayed yesterday pending insurance authorization for tube feedings.  This morning patient has no nausea or vomiting, no chest pain or dyspnea. Tolerating tube feedings well.   VS 110/74, HR 88, RR 18 and oxygen saturation 100% Heart S1 and S2 present and rhythmic Lungs are clear to auscultation Abdomen is soft and non tender. Peg tube in place Trace bilateral lower extremity edema.   Plan to discharge home today with home health services.  Follow up with primary care.   I spoke over the phone with the patient's daughter about patient's  condition, plan of care, prognosis and all questions were addressed.

## 2021-09-22 NOTE — Progress Notes (Incomplete)
New Patient Office Visit  Subjective:  Patient ID: Kristy [name], female    DOB: [birth year]  Age: 75 y.o. MRN: [id number]  CC: No chief complaint on file.   HPI Kristy [name] presents for    Post hosp f/u and est pcp  Admit date: 07/20/2021 Discharge date: 08/06/2021   Admitted From: SNF Disposition:  SNF   Recommendations for Outpatient Follow-up:  Follow up with PCP in 1 week Please obtain CMP/CBC in weekly and send it to infectious disease office while patient is on IV antibiotics. Will need urology follow-up with Kristy [name] to remove catheter and voiding trial. Call office in a week.   Follow-up with interval serology regarding PEG tube reversals once patient no longer needs after evaluation by the doctor and dietitian in the facility patient is consistently meeting her nutritional goal  PICC line removal after completion of IV antibiotics-continue routine PICC care   Home Health:NO  Equipment/Devices: NONE   Discharge Condition: Stable Code Status:   Code Status: Full Code Diet recommendation:  Diet Order                  DIET DYS 3 Room service appropriate? Yes with Assist; Fluid consistency: Thin  Diet effective now                         Brief/Interim Summary: 29 old female with hypertension, polycythemia, hypertension alcohol use disorder two 12 ounce beer daily, recently admitted and discharged from [facility]  06/10/21-07/11/21 for 2 mechanical falls  and back pain at home-chest x-ray CT head C-spine unremarkable on admission, but had leukocytosis AKI abnormal LFTs urine culture with staph aureus blood culture was positive-and on further work-up found to have cervical epidural abscess C2-T3/4 status post ACDF on 06/16/2021 by  Dr Kristy [name] +MSSA and was discharged on IV cefazolin and rifampin through 08/11/2021 and hospital course complicated by acute encephalopathy, dysphagia needing PEG tube, had recurrent urinary retention/UTI-Foley catheter was placed and discharged  to SNF. Readmitted from the nursing facility due to shortness of breath, more confusion than usual baseline .   In the ED:CT head showed acute/subacute possible pontine infarct, CT abdomen pelvis showed dystrophic changes inferior endplate of 624THL, L1 vertebral body, narrowing of the intervening intervertebral disc and a small amount of soft tissue thickening anteriorly, new finding compared to previous CTs and may represent sequelae of discitis/osteomyelitis, MRI lumbar C-spine was ordered and lab work showed leukocytosis 12.5, CT chest small amount of airspace consolidation versus atelectasis in the bilateral lung bases. ED called [facility] per daughter request but no bed, then patient was admitted MRI brain positive for 1.5 cm? acute to subacute ischemic nonhemorrhagic central pontine infarct. MRI C-spine and L spine done: Referto the report for more detail but shows -vertebral -cervical/lumbar osteomyelitis with epidural abscesses, paraspinal muscle abscesses. 9/11 Neurosurgery, ID, neurology was consulted-Neurosurgery did not advise acute surgical intervention/ I&D. SLP-started pured diet, was n.p.o. plus tube feed PTA 9/13:Repeat MRI brain w/ contrast:no intracranial focus of abnormal contrast enhancement. 9/14 ID recommended to continue with cefazolin through 08/25/2021, outpatient ID follow-up, overall treatment failure in the setting of hardware is higher especially in the setting of Staph aureus  Neurology and ID signed off, neurosurgery again saw on  9/15 and signed off. 9/19-psychiatry consulted-meds as adjusted and patient no more verbally abusive Patient remains confused, follows simple commands-mental status overall the same with subacute encephalopathy.  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  Seen by ID, neurosurgery-did not advise acute surgical intervention. Blood culture no growth this admission. ID advised to continue Cefazolin through 08/25/2021 w/ PICC line-which will be inserted prior to discharge today.   Cont PIV. Overall treatment failure in the setting of hardware is higher especially in the setting of Staph aureus.   Acute Metabolic encephalopathy:multifactorial in the setting of her persistent spine infection and deconditioning, history of alcohol., now with subacute pontine infarct deconditioning.  Seen by neurology. S/P High-dose thiamine.on previous discharge mentation similar, pleasantly confused follows some commands.seen by neurology.  Psychiatry consulted 9/19 as patient was verbally abusive-mentation improved after being on Zyprexa 2.5 mg a.m, and 5 mg pm, melatonin nightly .  Continues to have subacute encephalopathy at this time alert awake follows simple commands instruction .minimize narcotics sedatives  and benzo.continue fall precaution delirium precaution re-orientation . I wonder if this is her new baseline.   Subacute pontine infarct: nocontrast MRI 1.5 cm 2/2 subacute ischemic likely affecting multiple penetrating arteries due to small vessel disease.MRI brain with contrast repeated 9/12-no intracranial focus of abnormal contrast-enhancement.  Seen by neurology completed stroke -HBA1c 5.1 , lipid panel  ldl 88, goal < 70,CT angio head and neck no LVO. Echo with EF 60 to 65%, no RWMA, normal mitral valve, aortic valve with mild AR.  Continue ASA 81 mg, Lipitor 40 mg.   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   Generalized weakness but able to move lower extremities very well with weakness more on upper extremities.  Continue PT OT skilled rehab   CKD stage II-renal function is stable Recurrent urinary tract infection requiring Foley catheter insertion last admission Urine culture with Klebsiella 30,000 colonies and yeast 40,000 colonies: Urine culture reviewed with ID.  No need of further antibiotics continue cefazolin. ?  Colonization.Continue Foley care, continue Flomax monitor urine output and renal function intermittently. Last Labs         Recent Labs  Lab 07/31/21 0445 08/01/21 0350 08/03/21 0603 08/05/21 0706  BUN 30* 25* 25* 31*  CREATININE 0.72 0.69 0.72 0.85      Diarrhea/loose stool: needed rectal tube briefly but resolved after changing Osmolite to  Jevity.  Continue probiotics while on antibiotics.    Hypokalemia resolved.   CT w/ Bilateral lung base consolidation/atelectasis-no respiratory symptoms, do not suspect pneumonia.  Encourage incentive spirometry, unfortunately at risk of pneumonia due to her current situation    Moderate Protein Calorie malnutrition: Hypoglycemia: Hypoalbuminemia: Tolerating tube feeding with Jevity .  SLP advanced to dysphagia 3 diet, augment nutritional status.  Once able to meet nutritional goal family looking into reversing PEG tube IR number provided Last Labs          Recent Labs  Lab 08/05/21 1646 08/05/21 2005 08/05/21 2317 08/06/21 0353 08/06/21 0829  GLUCAP 90 88 77 82 88      Mildly elevated bilirubin:Monitor.   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  On prn atarax, benadryl and skin cream.Rifampin stopped in case it was making the rash worse. Saw dermatology before.Will need dermatology follow up, ? biopsy  as OP.    Hypertension hx: BP stable. as on midodrine PTA-discontinued this admission and blood pressure doing well    Chronic anemia Epistaxis on last  admission-needing blood transfusion Monitor transfuse if less than 7 g- monitor.  Hemoglobin stable 8.9 g.   Last Labs         Recent Labs  Lab 07/31/21 0445 08/01/21 0350 08/03/21 0603 08/05/21 0706  HGB 7.0* 7.8* 7.7* 8.9*  HCT 22.5* 24.8* 24.5* 29.1*      [id number] full code.Given her infectious process, encephalopathy, co-morbidities palliative care consulted.Patient daughter claims patient was functional prior to last hospital admission and requesting full aggressive care at this time.PMT involved, overall prognosis does not appear bright. Seen by multiple consults during this admissions and has been deemed stable for discharge back to skilled nursing facility to continue ongoing therapy, IV antibiotics and outpatient follow-up. At risk of readmission. EDP called [facility] but no beds so admitted to [facility]. Per daughter request called [facility] transfer line 9/12 evening and was  called by by Dr Kerby Less and NOT accepted for transfer.   Consults: Neurosurgery Neurology Infectious disease Palliative medicine    Admit date: 08/28/2021 Discharge date: 09/16/2021   Admitted From: Home  Disposition:  Home    Recommendations for Outpatient Follow-up and new medication changes:  Follow up with Dr. Dorthy Cooler in 7 to 10 days.  Continue home health services. At this point will dc home instead of SNF, her daughter will be care giver.  Palliative care as outpatient.  Follow thyroid function testing as outpatient, patient has been placed on levothyroxine.    Home Health: yes   Equipment/Devices:  hospital bed, wheelchair, bedside commode    Discharge Condition: stable  CODE STATUS: full  Diet recommendation:  dysphagia 1 with aspiration precautions.    Brief/Interim Summary: Kristy [name] was admitted to the hospital with the working diagnosis of acute hypoxemic respiratory failure, due to aspiration pneumonia, complicated with cardiac arrest and septic shock (present on  admission).   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 In the emergency room she was found in respiratory distress, including encephalopathy and she and was intubated. While still in the ED she had cardiac arrest x2, requiring epinephrine and cardioversion.  On admission to the ICU her blood pressure was 195/138, HR 100, RR 57 and oxygenation 85%. Patient not responsive, lungs with bilateral rhonchi and thick secretions per ET tube, heart S1 and S2 present and rhythmic, abdomen soft and +2 lower extremity edema.    Na 137, K 5,7, Cl 105, bicarbonate 26, glucose 86, BUN 49 cr 1,14. Wbc 6,7, hgb 9,1, hct 31.3 and platelets 84.  SARS COVID 19 negative.    UA SG 1,020, 6-10 wbc, negative nitrates.    Head CT with no acute changes.  Neck CT with no acute changes.    Chest x ray with bilateral pleural effusions more left than right with almost complete opacification of left side. Chest CT with bilateral pleural effusion and bilateral lower lobes dense nodular infiltrates.    Patient was admitted to the ICU with the working diagnosis of septic shock. She was placed on vasopressors and broad spectrum antibiotic therapy.  Blood cultures positive for Acinetobacter.  Vasopressors were weaned off with good toleration.    Patient liberated from mechanical ventilation on 09/01/21.    Transfer to [facility] on 09/03/21.  Completed antibiotic therapy with Unasyn 14 days.  Patient with recurrent hypoglycemia.    Patient continue to be very weak and deconditioned, she is tolerating tube feedings well. Now medically stable and her daughter has decided to take her home for further care.     Acute hypoxemic respiratory failure due to pneumonia, Acinetobacter bacteremia, septic shock.  Present on admission. Patient had a prolonged hospitalization, she was critically ill in  intensive care unit, she received mechanical ventilation and vasopressors. Eventually she regained hemodynamic stability and she was liberated from mechanical ventilation. She received full course of antibiotic therapy during her hospitalization.   At the time of discharge she is oxygenating well, no dyspnea.   2.  Acute metabolic encephalopathy.  Likely multifactorial, including critical illness. Her mentation has been improving, she is able to respond questions appropriately, no agitation. She is getting nutrition through PEG tube (placed on 07/02/21), continue quetiapine and thiamine.   3.  Acute kidney injury due to acute tubular necrosis, hypomagnesemia, hypernatremia, hyperchloremia and hypokalemia. Patient received intravenous fluids and supportive medical therapy including vasopressors. Her electrolytes were corrected, at the time of discharge sodium 146, potassium 4.1, chloride 113, bicarb 25, glucose 75, BUN 66, creatinine 0.71, magnesium 2.1 Continue tube feeds and free water flushes. Currently 300 cc of free water every 6 hours. Follow-up electrolytes as an outpatient.   4.  Systolic heart failure.  Acute heart failure exacerbation, likely related to septic shock.   Her echocardiography showed a reduction in ejection fraction down to 40 to 45%, severe hypokinesis/akinesis of the left ventricular, apical inferior septal wall and inferior wall and apical segment.  RV systolic function preserved.  Left atrium moderately dilated.  No significant valvular disease.   Continue medical therapy and close follow-up as an outpatient.   5.  Acute on chronic anemia.  Likely multifactorial, including critical illness. Patient required 2 units of packed red blood cell transfusion. Her discharge hemoglobin is 7.8, hematocrit 25.8.   6.  Recurrent hypoglycemia, severe protein calorie malnutrition, sacral and right buttock pressure ulcer stage II, present on admission. Patient had frequent  glucose monitoring, initially hypoglycemic that has improved with enteral nutrition through PEG tube. Her capillary glucose today 90-85-92.   7.  History of CVA.  Dyslipidemia.  Patient is nonambulatory, positive swallow dysfunction, and has PEG tube for nutrition. Continue aspirin and atorvastatin. Patient will need close supervision at home, [facility] lift to transfer from bed to wheelchair.   8.  Recent discitis, osteomyelitis at C4-5, C6-7 and possibly T1-2.  Patient completed IV antibiotic therapy as planned on October 16.  Continue on suppressive antibiotic therapy with doxycycline for 30 days.   9.  Hypothyroid.  Continue levothyroxine.   Discharge Diagnoses:  Principal Problem:   Pneumonia Active Problems:   HTN (hypertension)   Vertebral osteomyelitis (HCC)   Protein-calorie malnutrition, severe   Pressure injury of skin   Severe sepsis with septic shock (HCC)   Respiratory failure with hypoxia (HCC)   Cardiac arrest (HCC)   Acute metabolic encephalopathy   Hypoglycemia   No past medical history on file.  Past Surgical History:  Procedure Laterality Date   ANTERIOR CERVICAL DECOMP/DISCECTOMY FUSION N/A 06/16/2021   Procedure: ANTERIOR CERVICAL DISCECTOMY FUSION Cervical five -Cervical six, Evacuation of Epidural Abscess;  Surgeon: Jadene Pierini, MD;  Location: [facility] OR;  Service: Neurosurgery;  Laterality: N/A;   IR GASTROSTOMY TUBE MOD SED  07/02/2021    No family history on file.  Social History  Socioeconomic History   Marital status: Married    Spouse name: Not on file   Number of children: Not on file   Years of education: Not on file   Highest education level: Not on file  Occupational History   Not on file  Tobacco Use   Smoking status: Unknown   Smokeless tobacco: Not on file  Substance and Sexual Activity   Alcohol use: Not on file   Drug use: Not on file   Sexual activity: Not on file  Other Topics Concern   Not on file  Social  History Narrative   Not on file   Social Determinants of Health   Financial Resource Strain: Not on file  Food Insecurity: Not on file  Transportation Needs: Not on file  Physical Activity: Not on file  Stress: Not on file  Social Connections: Not on file  Intimate Partner Violence: Not on file    ROS Review of Systems  Objective:   Today's Vitals: There were no vitals taken for this visit.  Physical Exam  Assessment & Plan:   Problem List Items Addressed This Visit   None   Outpatient Encounter Medications as of 09/23/2021  Medication Sig   acetaminophen (TYLENOL) 500 MG tablet Place 1 tablet (500 mg total) into feeding tube every 6 (six) hours as needed for mild pain (or Fever >/= 101).   albuterol (PROVENTIL) (2.5 MG/3ML) 0.083% nebulizer solution Take 3 mLs (2.5 mg total) by nebulization every 6 (six) hours as needed for wheezing or shortness of breath.   allopurinol (ZYLOPRIM) 100 MG tablet Take 100 mg by mouth daily.   aspirin 81 MG chewable tablet Place 1 tablet (81 mg total) into feeding tube daily.   atorvastatin (LIPITOR) 40 MG tablet Place 1 tablet (40 mg total) into feeding tube at bedtime.   bisacodyl (DULCOLAX) 10 MG suppository Place 10 mg rectally as needed for moderate constipation.   diclofenac Sodium (VOLTAREN) 1 % GEL Apply 4 g topically 4 (four) times daily. To neck   doxycycline (VIBRAMYCIN) 25 MG/5ML SUSR Place 20 mLs (100 mg total) into feeding tube 2 (two) times daily for 53 doses.   folic acid (FOLVITE) 1 MG tablet Place 1 tablet (1 mg total) into feeding tube daily.   hydrOXYzine (ATARAX/VISTARIL) 25 MG tablet Place 0.5 tablets (12.5 mg total) into feeding tube every 8 (eight) hours as needed for itching.   levothyroxine (SYNTHROID) 100 MCG tablet Place 1 tablet (100 mcg total) into feeding tube daily at 6 (six) AM.   loperamide HCl (IMODIUM) 1 MG/7.5ML suspension Place 15 mLs (2 mg total) into feeding tube as needed for diarrhea or  loose stools.   melatonin 3 MG TABS tablet Take 1 tablet (3 mg total) by mouth at bedtime. (Patient taking differently: Place 3 mg into feeding tube at bedtime.)   Multiple Vitamin (MULTIVITAMIN) LIQD Place 15 mLs into feeding tube daily.   niacin 500 MG tablet Place 1 tablet (500 mg total) into feeding tube at bedtime.   nutrition supplement, JUVEN, (JUVEN) PACK Place 1 packet into feeding tube 2 (two) times daily between meals.   Nutritional Supplements (FEEDING SUPPLEMENT, OSMOLITE 1.5 CAL,) LIQD Place 355 mLs into feeding tube 4 (four) times daily.   Nutritional Supplements (FEEDING SUPPLEMENT, PROSOURCE TF,) liquid Place 45 mLs into feeding tube daily.   QUEtiapine (SEROQUEL) 25 MG tablet Place 1 tablet (25 mg total) into feeding tube at bedtime.   saccharomyces boulardii (FLORASTOR) 250 MG capsule Place 1 capsule (  250 mg total) into feeding tube 2 (two) times daily.   Skin Protectants, Misc. (EUCERIN) cream Apply 1 application topically in the morning, at noon, and at bedtime. Apply to Trunk & extremities for dry skin   thiamine 100 MG tablet Place 1 tablet (100 mg total) into feeding tube daily.   Water For Irrigation, Sterile (FREE WATER) SOLN Place 100 mLs into feeding tube every 6 (six) hours.   No facility-administered encounter medications on file as of 09/23/2021.    Follow-up: No follow-ups on file.   Asencion Noble, MD

## 2021-09-23 ENCOUNTER — Inpatient Hospital Stay: Payer: Self-pay | Admitting: Critical Care Medicine

## 2021-11-18 NOTE — Progress Notes (Deleted)
Office Visit    Patient Name: Kristy [name] Date of Encounter: 11/19/2021  Primary Care Provider:  Lujean Amel, MD Primary Cardiologist:  Buford Dresser, MD  Chief Complaint    76 year old female with a history of cardiac/respiratory arrest in the setting of aspiration, HFmrEF, altered mental status, alcohol abuse, C-spine discitis and osteomyelitis, CVA, hypothyroidism, recurrent hypoglycemia, severe protein malnutrition s/p PEG tube placement, stage II pressure ulcer to R sacrum/buttocks, and anemia who presents for follow-up related to HFmrEF post cardiac arrest.    Past Medical History    No past medical history on file. Past Surgical History:  Procedure Laterality Date   ANTERIOR CERVICAL DECOMP/DISCECTOMY FUSION N/A 06/16/2021   Procedure: ANTERIOR CERVICAL DISCECTOMY FUSION Cervical five -Cervical six, Evacuation of Epidural Abscess;  Surgeon: Judith Part, MD;  Location: [place];  Service: Neurosurgery;  Laterality: N/A;   IR GASTROSTOMY TUBE MOD SED  07/02/2021    Allergies  No Known Allergies  History of Present Illness    76 year old female with the above complex past medical history including cardiac/respiratory arrest in the setting of aspiration pneumonia/septic shock, HFmrEF, hyperlipidemia, altered mental status, alcohol abuse, C-spine discitis and osteomyelitis, CVA, hypothyroidism, recurrent hypoglycemia, severe protein malnutrition s/p PEG tube placement, stage II pressure ulcer to R sacrum/buttocksr, and anemia.  She was previously hospitalized in August 2022 for C-spine discitis, osteomyelitis for which she  underwent I&D anterior cervical discectomy C5-C6, treated with IV antibiotics. Echo in August 2022 showed EF 65 to 70%, grade 1 DD, mild MR.  She underwent PEG tube placement on 07/11/2021. She was readmitted to the hospital in September 2022 with altered mental status in the setting of acute pontine infarct, not on antiplatelet due to increased  risk of bleeding. Repeat echo at the time was stable, no acute changes. Palliative care was consulted, however, family desired aggressive medical treatment and full code status.    Unfortunately, she returned to the ED on 08/28/2021 with altered mental status in the setting of acute hypoxemic respiratory failure secondary to aspiration pneumonia, septic shock. She suffered a cardiac/respiratory arrest while in the ED.  She was intubated on 08/28/2021 and extubated on 09/03/2021. Troponins were unremarkable at the time of arrest.  CT of the head and neck showed no acute changes.  Chest x-ray/CT chest showed bilateral pleural effusions.  She was treated with IV antibiotics. Repeat echocardiogram showed EF of 40 to 45%, mild LVH, hypokinesis and akinesis of the LV, apical inferoseptal wall, inferior wall, near apical segment, mild MR, mild to moderate AI. Cardiology was consulted for evaluation of LV dysfunction on echocardiogram. It was thought that her event was a respiratory arrest in the setting of aspiration pneumonia/septic shock and medical management for newly reduced EF was advised with plans for repeat echocardiogram as an outpatient. She was discharged to home with home health and outpatient palliative care services on 09/16/2021.  She presents today for follow-up.  Since her hospitalization   HFmrEF/chronic systolic heart failure: Recent echocardiogram showed EF of 40 to 45%, mild LVH, hypokinesis and akinesis of the LV, apical inferoseptal wall, inferior wall, near apical segment, mild MR, mild to moderate AI. Euvolemic and well compensated on exam. Repeat echocardiogram.  S/p Cardiac/respiratory arrest/septic shock/aspiration pneumonia/altered mental status/acute encephalopathy: In the setting of acute hypoxemic respiratory failure secondary to aspiration pneumonia, septic shock. Troponins were unremarkable at the time of arrest.   Severe protein malnutrition s/p PEG tube placement: Palliative  care following as outpatient.  Hyperlipidemia: LDL 88 on 07/21/21. Continue ASA, Lipitor.   H/o CVA: Continue ASA, Lipitor as above.   Disposition:    Home Medications    Current Outpatient Medications  Medication Sig Dispense Refill   acetaminophen (TYLENOL) 500 MG tablet Place 1 tablet (500 mg total) into feeding tube every 6 (six) hours as needed for mild pain (or Fever >/= 101). 30 tablet 0   albuterol (PROVENTIL) (2.5 MG/3ML) 0.083% nebulizer solution Take 3 mLs (2.5 mg total) by nebulization every 6 (six) hours as needed for wheezing or shortness of breath. 75 mL 12   allopurinol (ZYLOPRIM) 100 MG tablet Take 100 mg by mouth daily.     aspirin 81 MG chewable tablet Place 1 tablet (81 mg total) into feeding tube daily.     atorvastatin (LIPITOR) 40 MG tablet Place 1 tablet (40 mg total) into feeding tube at bedtime.     bisacodyl (DULCOLAX) 10 MG suppository Place 10 mg rectally as needed for moderate constipation.     diclofenac Sodium (VOLTAREN) 1 % GEL Apply 4 g topically 4 (four) times daily. To neck     folic acid (FOLVITE) 1 MG tablet Place 1 tablet (1 mg total) into feeding tube daily.     hydrOXYzine (ATARAX/VISTARIL) 25 MG tablet Place 0.5 tablets (12.5 mg total) into feeding tube every 8 (eight) hours as needed for itching. 30 tablet 0   levothyroxine (SYNTHROID) 100 MCG tablet Place 1 tablet (100 mcg total) into feeding tube daily at 6 (six) AM. 30 tablet 0   loperamide HCl (IMODIUM) 1 MG/7.5ML suspension Place 15 mLs (2 mg total) into feeding tube as needed for diarrhea or loose stools. 120 mL 0   melatonin 3 MG TABS tablet Take 1 tablet (3 mg total) by mouth at bedtime. (Patient taking differently: Place 3 mg into feeding tube at bedtime.)  0   Multiple Vitamin (MULTIVITAMIN) LIQD Place 15 mLs into feeding tube daily.     niacin 500 MG tablet Place 1 tablet (500 mg total) into feeding tube at bedtime.     Nutritional Supplements (FEEDING SUPPLEMENT, OSMOLITE 1.5 CAL,)  LIQD Place 355 mLs into feeding tube 4 (four) times daily. 1000 mL 0   Nutritional Supplements (FEEDING SUPPLEMENT, PROSOURCE TF,) liquid Place 45 mLs into feeding tube daily. 45 mL 0   QUEtiapine (SEROQUEL) 25 MG tablet Place 1 tablet (25 mg total) into feeding tube at bedtime. 30 tablet 0   Skin Protectants, Misc. (EUCERIN) cream Apply 1 application topically in the morning, at noon, and at bedtime. Apply to Trunk & extremities for dry skin     thiamine 100 MG tablet Place 1 tablet (100 mg total) into feeding tube daily.     Water For Irrigation, Sterile (FREE WATER) SOLN Place 100 mLs into feeding tube every 6 (six) hours. 1000 mL 0   No current facility-administered medications for this visit.     Review of Systems    ***.  All other systems reviewed and are otherwise negative except as noted above.    Physical Exam    VS:  There were no vitals taken for this visit. , BMI There is no height or weight on file to calculate BMI.     GEN: Well nourished, well developed, in no acute distress. HEENT: normal. Neck: Supple, no JVD, carotid bruits, or masses. Cardiac: RRR, no murmurs, rubs, or gallops. No clubbing, cyanosis, edema.  Radials/DP/PT 2+ and equal bilaterally.  Respiratory:  Respirations regular and unlabored, clear to  auscultation bilaterally. GI: Soft, nontender, nondistended, BS + x 4. MS: no deformity or atrophy. Skin: warm and dry, no rash. Neuro:  Strength and sensation are intact. Psych: Normal affect.  Accessory Clinical Findings    ECG personally reviewed by me today - *** - no acute changes.  Lab Results  Component Value Date   WBC 6.3 09/14/2021   HGB 7.8 (L) 09/15/2021   HCT 25.8 (L) 09/15/2021   MCV 109.8 (H) 09/14/2021   PLT 171 09/14/2021   Lab Results  Component Value Date   CREATININE 0.78 09/17/2021   BUN 66 (H) 09/15/2021   NA 146 (H) 09/15/2021   K 4.1 09/15/2021   CL 113 (H) 09/15/2021   CO2 25 09/15/2021   Lab Results  Component Value  Date   ALT 11 08/31/2021   AST 49 (H) 08/31/2021   ALKPHOS 88 08/31/2021   BILITOT 0.8 08/31/2021   Lab Results  Component Value Date   CHOL 137 07/21/2021   HDL 29 (L) 07/21/2021   LDLCALC 88 07/21/2021   TRIG 91 08/29/2021   CHOLHDL 4.7 07/21/2021    Lab Results  Component Value Date   HGBA1C 5.1 07/21/2021    Assessment & Plan    1.  ***   Lenna Sciara, NP 11/19/2021, 7:30 AM

## 2021-11-19 ENCOUNTER — Ambulatory Visit (HOSPITAL_BASED_OUTPATIENT_CLINIC_OR_DEPARTMENT_OTHER): Payer: Self-pay | Admitting: Nurse Practitioner

## 2023-03-13 ENCOUNTER — Telehealth: Payer: Self-pay | Admitting: *Deleted

## 2023-03-13 ENCOUNTER — Encounter: Payer: Self-pay | Admitting: *Deleted

## 2023-03-13 NOTE — Transitions of Care (Post Inpatient/ED Visit) (Signed)
   03/13/2023  Name: JELINA MELNIK MRN: [id number] DOB: 11/22/1945  Today's TOC FU Call Status: Today's TOC FU Call Status:: Successful TOC FU Call Competed TOC FU Call Complete Date: 03/13/23  Attempted to reach the patient regarding the most recent Inpatient/ED visit.    Successfully spoke with patient's daughter Selena Batten who reports patient is now being followed by Deboraha Sprang PCP; declines TOC; states working now to get hospital follow up visit with patient's PCP at [facility] and this was encouraged   Follow Up Plan: No further outreach attempts will be made at this time.  Caryl Pina, RN, BSN, CCRN Alumnus RN CM Care Coordination/ Transition of Care- [facility] Care Management [phone]: direct office

## 2023-08-06 ENCOUNTER — Inpatient Hospital Stay: Payer: Medicare Other | Attending: Hematology and Oncology

## 2023-08-06 ENCOUNTER — Inpatient Hospital Stay (HOSPITAL_BASED_OUTPATIENT_CLINIC_OR_DEPARTMENT_OTHER): Payer: Medicare Other | Admitting: Hematology and Oncology

## 2023-08-06 VITALS — BP 128/74 | HR 82 | Temp 97.9°F | Resp 18 | Ht 66.0 in | Wt 124.5 lb

## 2023-08-06 DIAGNOSIS — D696 Thrombocytopenia, unspecified: Secondary | ICD-10-CM | POA: Insufficient documentation

## 2023-08-06 LAB — CBC WITH DIFFERENTIAL (CANCER CENTER ONLY)
Abs Immature Granulocytes: 0.01 10*3/uL (ref 0.00–0.07)
Basophils Absolute: 0 10*3/uL (ref 0.0–0.1)
Basophils Relative: 1 %
Eosinophils Absolute: 1.2 10*3/uL — ABNORMAL HIGH (ref 0.0–0.5)
Eosinophils Relative: 17 %
HCT: 42.4 % (ref 36.0–46.0)
Hemoglobin: 14.4 g/dL (ref 12.0–15.0)
Immature Granulocytes: 0 %
Lymphocytes Relative: 13 %
Lymphs Abs: 0.9 10*3/uL (ref 0.7–4.0)
MCH: 31.2 pg (ref 26.0–34.0)
MCHC: 34 g/dL (ref 30.0–36.0)
MCV: 92 fL (ref 80.0–100.0)
Monocytes Absolute: 0.6 10*3/uL (ref 0.1–1.0)
Monocytes Relative: 8 %
Neutro Abs: 4.2 10*3/uL (ref 1.7–7.7)
Neutrophils Relative %: 61 %
Platelet Count: 119 10*3/uL — ABNORMAL LOW (ref 150–400)
RBC: 4.61 MIL/uL (ref 3.87–5.11)
RDW: 12.9 % (ref 11.5–15.5)
WBC Count: 6.9 10*3/uL (ref 4.0–10.5)
nRBC: 0 % (ref 0.0–0.2)

## 2023-08-06 LAB — IMMATURE PLATELET FRACTION: Immature Platelet Fraction: 27.6 % — ABNORMAL HIGH (ref 1.2–8.6)

## 2023-08-06 NOTE — Assessment & Plan Note (Signed)
Lab review: 11/26/2021: WBC 5.6, hemoglobin 13.6, platelets 169, ANC 1.1 03/23/2023: WBC 5.4, hemoglobin 12.7, platelets 158 MPV 13, ANC 1.5 07/16/2023: WBC 5.4, hemoglobin 14.3, platelets 67, MPV 13.7, AEC 1.4, B12 268 Hospitalization 03/12/2023: Osteomyelitis C4-5-6 and 7 and T1-T2  Differential diagnosis of thrombocytopenia: Spurious thrombocytopenia Autoimmune thrombocytopenia Medication related  Elevated eosinophil count: Also possibly medication related.  She has history of atopic dermatitis and hence that this could also be an autoimmune process.  Recheck CBC to avoid any clumping. Check immature platelet fraction.  This test will help [place] determine if there is decreased production or increased consumption.

## 2023-08-06 NOTE — Progress Notes (Signed)
[facility] CONSULT NOTE  Patient Care Team: Darrow Bussing, MD as PCP - General (Family Medicine) Jodelle Red, MD as PCP - Cardiology (Cardiology)  CHIEF COMPLAINTS/PURPOSE OF CONSULTATION:  Thrombocytopenia consultation  HISTORY OF PRESENTING ILLNESS:  Kristy [name] 77 y.o. female is here because of recent diagnosis of thrombocytopenia.  Patient is in a wheelchair and has a prior diagnosis of B12 deficiency, hypothyroidism, gout, alcoholism and hypercholesterolemia along with atopic dermatitis for which she is currently on treatment with Dupixent.  She was noted on a CBC to have a platelet count of 68 and she was referred to [place].  She has not had any bruising bleeding or any other concerns from thrombocytopenia.  I reviewed her records extensively and collaborated the history with the patient.   MEDICAL HISTORY:  No past medical history on file.  SURGICAL HISTORY: Past Surgical History:  Procedure Laterality Date   ANTERIOR CERVICAL DECOMP/DISCECTOMY FUSION N/A 06/16/2021   Procedure: ANTERIOR CERVICAL DISCECTOMY FUSION Cervical five -Cervical six, Evacuation of Epidural Abscess;  Surgeon: Jadene Pierini, MD;  Location: [facility];  Service: Neurosurgery;  Laterality: N/A;   IR GASTROSTOMY TUBE MOD SED  07/02/2021    SOCIAL HISTORY: Social History   Socioeconomic History   Marital status: Married    Spouse name: Not on file   Number of children: Not on file   Years of education: Not on file   Highest education level: Not on file  Occupational History   Not on file  Tobacco Use   Smoking status: Unknown   Smokeless tobacco: Not on file  Substance and Sexual Activity   Alcohol use: Not on file   Drug use: Not on file   Sexual activity: Not on file  Other Topics Concern   Not on file  Social History Narrative   Not on file   Social Determinants of Health   Financial Resource Strain: Not on file  Food Insecurity: No Food Insecurity (03/06/2023)    [facility], [facility]   Hunger Vital Sign    Worried About Running Out of Food in the Last Year: Never true    Ran Out of Food in the Last Year: Never true  Transportation Needs: No Transportation Needs (03/10/2023)   Received from [employer], [facility]   PRAPARE - Transportation    Lack of Transportation (Medical): No    Lack of Transportation (Non-Medical): No  Physical Activity: Not on file  Stress: No Stress Concern Present (03/06/2023)   Received from [facility], [facility] - Occupational Stress Questionnaire    Feeling of Stress : Not at all  Social Connections: Unknown (03/06/2023)   Received from [facility], [facility]   Social Network    Social Network: Not on file  Intimate Partner Violence: Not At Risk (03/06/2023)   Received from [facility], [facility]   HITS    Over the last 12 months how often did your partner physically hurt you?: 1    Over the last 12 months how often did your partner insult you or talk down to you?: 1    Over the last 12 months how often did your partner threaten you with physical harm?: 1    Over the last 12 months how often did your partner scream or curse at you?: 1    FAMILY HISTORY: No family history on file.  ALLERGIES:  has No Known Allergies.  MEDICATIONS:  Current  Outpatient Medications  Medication Sig Dispense Refill   acetaminophen (TYLENOL) 500 MG tablet Place 1 tablet (500 mg total) into feeding tube every 6 (six) hours as needed for mild pain (or Fever >/= 101). 30 tablet 0   albuterol (PROVENTIL) (2.5 MG/3ML) 0.083% nebulizer solution Take 3 mLs (2.5 mg total) by nebulization every 6 (six) hours as needed for wheezing or shortness of breath. 75 mL 12   allopurinol (ZYLOPRIM) 100 MG tablet Take 100 mg by mouth daily.     aspirin 81 MG chewable tablet Place 1 tablet (81 mg total) into feeding tube daily.     atorvastatin (LIPITOR) 40 MG tablet  Place 1 tablet (40 mg total) into feeding tube at bedtime.     bisacodyl (DULCOLAX) 10 MG suppository Place 10 mg rectally as needed for moderate constipation.     diclofenac Sodium (VOLTAREN) 1 % GEL Apply 4 g topically 4 (four) times daily. To neck     folic acid (FOLVITE) 1 MG tablet Place 1 tablet (1 mg total) into feeding tube daily.     hydrOXYzine (ATARAX/VISTARIL) 25 MG tablet Place 0.5 tablets (12.5 mg total) into feeding tube every 8 (eight) hours as needed for itching. 30 tablet 0   levothyroxine (SYNTHROID) 100 MCG tablet Place 1 tablet (100 mcg total) into feeding tube daily at 6 (six) AM. 30 tablet 0   loperamide HCl (IMODIUM) 1 MG/7.5ML suspension Place 15 mLs (2 mg total) into feeding tube as needed for diarrhea or loose stools. 120 mL 0   melatonin 3 MG TABS tablet Take 1 tablet (3 mg total) by mouth at bedtime. (Patient taking differently: Place 3 mg into feeding tube at bedtime.)  0   Multiple Vitamin (MULTIVITAMIN) LIQD Place 15 mLs into feeding tube daily.     niacin 500 MG tablet Place 1 tablet (500 mg total) into feeding tube at bedtime.     Nutritional Supplements (FEEDING SUPPLEMENT, OSMOLITE 1.5 CAL,) LIQD Place 355 mLs into feeding tube 4 (four) times daily. 1000 mL 0   Nutritional Supplements (FEEDING SUPPLEMENT, PROSOURCE TF,) liquid Place 45 mLs into feeding tube daily. 45 mL 0   QUEtiapine (SEROQUEL) 25 MG tablet Place 1 tablet (25 mg total) into feeding tube at bedtime. 30 tablet 0   Skin Protectants, Misc. (EUCERIN) cream Apply 1 application topically in the morning, at noon, and at bedtime. Apply to Trunk & extremities for dry skin     thiamine 100 MG tablet Place 1 tablet (100 mg total) into feeding tube daily.     Water For Irrigation, Sterile (FREE WATER) SOLN Place 100 mLs into feeding tube every 6 (six) hours. 1000 mL 0   No current facility-administered medications for this visit.    REVIEW OF SYSTEMS:   Constitutional: Denies fevers, chills or abnormal  night sweats All other systems were reviewed with the patient and are negative.  PHYSICAL EXAMINATION: ECOG PERFORMANCE STATUS: 3 - Symptomatic, >50% confined to bed  Vitals:   08/06/23 1324  BP: 128/74  Pulse: 82  Resp: 18  Temp: 97.9 F (36.6 C)  SpO2: 100%   Filed Weights   08/06/23 1324  Weight: 124 lb 8 oz (56.5 kg)    GENERAL:alert, no distress and comfortable  LABORATORY DATA:  I have reviewed the data as listed Lab Results  Component Value Date   WBC 6.9 08/06/2023   HGB 14.4 08/06/2023   HCT 42.4 08/06/2023   MCV 92.0 08/06/2023   PLT 119 (L) 08/06/2023  Lab Results  Component Value Date   NA 146 (H) 09/15/2021   K 4.1 09/15/2021   CL 113 (H) 09/15/2021   CO2 25 09/15/2021      ASSESSMENT AND PLAN:  Thrombocytopenia (HCC) Lab review: 11/26/2021: WBC 5.6, hemoglobin 13.6, platelets 169, ANC 1.1 03/23/2023: WBC 5.4, hemoglobin 12.7, platelets 158 MPV 13, ANC 1.5 07/16/2023: WBC 5.4, hemoglobin 14.3, platelets 67, MPV 13.7, AEC 1.4, B12 268 Hospitalization 03/12/2023: Osteomyelitis C4-5-6 and 7 and T1-T2  Differential diagnosis of thrombocytopenia: Spurious thrombocytopenia: Unlikely Autoimmune thrombocytopenia: It is possible to be autoimmune related but the platelets are improving. Medication related Bone marrow suppression: Since the rest of the CBC is normal I do not believe there is any concern for bone marrow disorders.  08/06/2023: Platelets 119 IPF: 27.6 (indicates recovery from thrombocytopenia) I discussed with the patient that there is been a significant improvement in the platelet count.  I do not believe it is autoimmune or medication related.  It is definitely not related to Dupixent and therefore she can continue that.   Return to clinic on an as-needed basis.  All questions were answered. The patient knows to call the clinic with any problems, questions or concerns.    Tamsen Meek, MD 08/06/23

## 2024-10-11 ENCOUNTER — Other Ambulatory Visit (HOSPITAL_BASED_OUTPATIENT_CLINIC_OR_DEPARTMENT_OTHER): Payer: Self-pay | Admitting: Family Medicine

## 2024-10-11 DIAGNOSIS — E2839 Other primary ovarian failure: Secondary | ICD-10-CM
